# Patient Record
Sex: Male | Born: 1940
Health system: Southern US, Community
[De-identification: ages and names within clinical notes are randomized; demographics above are authoritative.]

## PROBLEM LIST (undated history)

## (undated) DIAGNOSIS — E7800 Pure hypercholesterolemia, unspecified: Secondary | ICD-10-CM

## (undated) DIAGNOSIS — I10 Essential (primary) hypertension: Secondary | ICD-10-CM

## (undated) DIAGNOSIS — R42 Dizziness and giddiness: Secondary | ICD-10-CM

## (undated) HISTORY — PX: CATARACT EXTRACTION: SUR2

## (undated) HISTORY — PX: APPENDECTOMY: SHX54

---

## 2005-09-02 ENCOUNTER — Ambulatory Visit: Payer: Self-pay | Admitting: Ophthalmology

## 2006-08-18 ENCOUNTER — Ambulatory Visit: Payer: Self-pay | Admitting: Gastroenterology

## 2011-06-20 DIAGNOSIS — D485 Neoplasm of uncertain behavior of skin: Secondary | ICD-10-CM | POA: Diagnosis not present

## 2011-07-24 DIAGNOSIS — E7800 Pure hypercholesterolemia, unspecified: Secondary | ICD-10-CM | POA: Diagnosis not present

## 2011-07-24 DIAGNOSIS — D485 Neoplasm of uncertain behavior of skin: Secondary | ICD-10-CM | POA: Diagnosis not present

## 2011-07-24 DIAGNOSIS — J069 Acute upper respiratory infection, unspecified: Secondary | ICD-10-CM | POA: Diagnosis not present

## 2011-07-24 DIAGNOSIS — R42 Dizziness and giddiness: Secondary | ICD-10-CM | POA: Diagnosis not present

## 2011-09-23 DIAGNOSIS — M79609 Pain in unspecified limb: Secondary | ICD-10-CM | POA: Diagnosis not present

## 2011-09-23 DIAGNOSIS — M722 Plantar fascial fibromatosis: Secondary | ICD-10-CM | POA: Diagnosis not present

## 2011-10-14 DIAGNOSIS — M722 Plantar fascial fibromatosis: Secondary | ICD-10-CM | POA: Diagnosis not present

## 2011-10-14 DIAGNOSIS — M79609 Pain in unspecified limb: Secondary | ICD-10-CM | POA: Diagnosis not present

## 2011-10-30 DIAGNOSIS — D485 Neoplasm of uncertain behavior of skin: Secondary | ICD-10-CM | POA: Diagnosis not present

## 2011-10-30 DIAGNOSIS — Z79899 Other long term (current) drug therapy: Secondary | ICD-10-CM | POA: Diagnosis not present

## 2011-10-30 DIAGNOSIS — E7800 Pure hypercholesterolemia, unspecified: Secondary | ICD-10-CM | POA: Diagnosis not present

## 2011-10-30 DIAGNOSIS — I10 Essential (primary) hypertension: Secondary | ICD-10-CM | POA: Diagnosis not present

## 2011-11-20 DIAGNOSIS — H33009 Unspecified retinal detachment with retinal break, unspecified eye: Secondary | ICD-10-CM | POA: Diagnosis not present

## 2011-12-02 DIAGNOSIS — M722 Plantar fascial fibromatosis: Secondary | ICD-10-CM | POA: Diagnosis not present

## 2012-01-21 DIAGNOSIS — M722 Plantar fascial fibromatosis: Secondary | ICD-10-CM | POA: Diagnosis not present

## 2012-04-28 DIAGNOSIS — I10 Essential (primary) hypertension: Secondary | ICD-10-CM | POA: Diagnosis not present

## 2012-04-28 DIAGNOSIS — D485 Neoplasm of uncertain behavior of skin: Secondary | ICD-10-CM | POA: Diagnosis not present

## 2012-04-28 DIAGNOSIS — Z23 Encounter for immunization: Secondary | ICD-10-CM | POA: Diagnosis not present

## 2012-04-28 DIAGNOSIS — Z125 Encounter for screening for malignant neoplasm of prostate: Secondary | ICD-10-CM | POA: Diagnosis not present

## 2012-04-28 DIAGNOSIS — Z1339 Encounter for screening examination for other mental health and behavioral disorders: Secondary | ICD-10-CM | POA: Diagnosis not present

## 2012-04-28 DIAGNOSIS — Z79899 Other long term (current) drug therapy: Secondary | ICD-10-CM | POA: Diagnosis not present

## 2012-04-28 DIAGNOSIS — Z1212 Encounter for screening for malignant neoplasm of rectum: Secondary | ICD-10-CM | POA: Diagnosis not present

## 2012-04-28 DIAGNOSIS — Z0000 Encounter for general adult medical examination without abnormal findings: Secondary | ICD-10-CM | POA: Diagnosis not present

## 2012-04-28 DIAGNOSIS — Z1331 Encounter for screening for depression: Secondary | ICD-10-CM | POA: Diagnosis not present

## 2012-10-05 DIAGNOSIS — D485 Neoplasm of uncertain behavior of skin: Secondary | ICD-10-CM | POA: Diagnosis not present

## 2012-10-05 DIAGNOSIS — L821 Other seborrheic keratosis: Secondary | ICD-10-CM | POA: Diagnosis not present

## 2012-10-28 DIAGNOSIS — J309 Allergic rhinitis, unspecified: Secondary | ICD-10-CM | POA: Diagnosis not present

## 2012-10-28 DIAGNOSIS — Z79899 Other long term (current) drug therapy: Secondary | ICD-10-CM | POA: Diagnosis not present

## 2012-10-28 DIAGNOSIS — I10 Essential (primary) hypertension: Secondary | ICD-10-CM | POA: Diagnosis not present

## 2012-10-28 DIAGNOSIS — E785 Hyperlipidemia, unspecified: Secondary | ICD-10-CM | POA: Diagnosis not present

## 2012-10-28 DIAGNOSIS — E7800 Pure hypercholesterolemia, unspecified: Secondary | ICD-10-CM | POA: Diagnosis not present

## 2012-11-18 DIAGNOSIS — D485 Neoplasm of uncertain behavior of skin: Secondary | ICD-10-CM | POA: Diagnosis not present

## 2013-04-22 ENCOUNTER — Ambulatory Visit: Payer: Self-pay | Admitting: Emergency Medicine

## 2013-04-22 DIAGNOSIS — J069 Acute upper respiratory infection, unspecified: Secondary | ICD-10-CM | POA: Diagnosis not present

## 2013-05-13 DIAGNOSIS — Z1339 Encounter for screening examination for other mental health and behavioral disorders: Secondary | ICD-10-CM | POA: Diagnosis not present

## 2013-05-13 DIAGNOSIS — Z125 Encounter for screening for malignant neoplasm of prostate: Secondary | ICD-10-CM | POA: Diagnosis not present

## 2013-05-13 DIAGNOSIS — Z1331 Encounter for screening for depression: Secondary | ICD-10-CM | POA: Diagnosis not present

## 2013-05-13 DIAGNOSIS — I10 Essential (primary) hypertension: Secondary | ICD-10-CM | POA: Diagnosis not present

## 2013-05-13 DIAGNOSIS — Z79899 Other long term (current) drug therapy: Secondary | ICD-10-CM | POA: Diagnosis not present

## 2013-05-13 DIAGNOSIS — E7800 Pure hypercholesterolemia, unspecified: Secondary | ICD-10-CM | POA: Diagnosis not present

## 2013-05-13 DIAGNOSIS — Z0000 Encounter for general adult medical examination without abnormal findings: Secondary | ICD-10-CM | POA: Diagnosis not present

## 2013-05-13 DIAGNOSIS — Z1212 Encounter for screening for malignant neoplasm of rectum: Secondary | ICD-10-CM | POA: Diagnosis not present

## 2013-06-09 DIAGNOSIS — Z1283 Encounter for screening for malignant neoplasm of skin: Secondary | ICD-10-CM | POA: Diagnosis not present

## 2013-06-09 DIAGNOSIS — D2360 Other benign neoplasm of skin of unspecified upper limb, including shoulder: Secondary | ICD-10-CM | POA: Diagnosis not present

## 2013-06-09 DIAGNOSIS — L570 Actinic keratosis: Secondary | ICD-10-CM | POA: Diagnosis not present

## 2013-06-09 DIAGNOSIS — C4441 Basal cell carcinoma of skin of scalp and neck: Secondary | ICD-10-CM | POA: Diagnosis not present

## 2013-06-09 DIAGNOSIS — D485 Neoplasm of uncertain behavior of skin: Secondary | ICD-10-CM | POA: Diagnosis not present

## 2013-06-09 DIAGNOSIS — Z872 Personal history of diseases of the skin and subcutaneous tissue: Secondary | ICD-10-CM | POA: Diagnosis not present

## 2013-06-30 DIAGNOSIS — Z23 Encounter for immunization: Secondary | ICD-10-CM | POA: Diagnosis not present

## 2013-06-30 DIAGNOSIS — I10 Essential (primary) hypertension: Secondary | ICD-10-CM | POA: Diagnosis not present

## 2013-06-30 DIAGNOSIS — Z79899 Other long term (current) drug therapy: Secondary | ICD-10-CM | POA: Diagnosis not present

## 2013-06-30 DIAGNOSIS — Z1212 Encounter for screening for malignant neoplasm of rectum: Secondary | ICD-10-CM | POA: Diagnosis not present

## 2013-07-28 DIAGNOSIS — C4441 Basal cell carcinoma of skin of scalp and neck: Secondary | ICD-10-CM | POA: Diagnosis not present

## 2013-08-12 DIAGNOSIS — Z961 Presence of intraocular lens: Secondary | ICD-10-CM | POA: Diagnosis not present

## 2013-08-12 DIAGNOSIS — H2510 Age-related nuclear cataract, unspecified eye: Secondary | ICD-10-CM | POA: Diagnosis not present

## 2013-08-19 DIAGNOSIS — I10 Essential (primary) hypertension: Secondary | ICD-10-CM | POA: Diagnosis not present

## 2013-08-19 DIAGNOSIS — J069 Acute upper respiratory infection, unspecified: Secondary | ICD-10-CM | POA: Diagnosis not present

## 2013-08-19 DIAGNOSIS — R059 Cough, unspecified: Secondary | ICD-10-CM | POA: Diagnosis not present

## 2013-08-19 DIAGNOSIS — Z1212 Encounter for screening for malignant neoplasm of rectum: Secondary | ICD-10-CM | POA: Diagnosis not present

## 2013-08-19 DIAGNOSIS — Z79899 Other long term (current) drug therapy: Secondary | ICD-10-CM | POA: Diagnosis not present

## 2013-08-19 DIAGNOSIS — R05 Cough: Secondary | ICD-10-CM | POA: Diagnosis not present

## 2014-01-25 DIAGNOSIS — Z872 Personal history of diseases of the skin and subcutaneous tissue: Secondary | ICD-10-CM | POA: Diagnosis not present

## 2014-01-25 DIAGNOSIS — Z85828 Personal history of other malignant neoplasm of skin: Secondary | ICD-10-CM | POA: Diagnosis not present

## 2014-01-25 DIAGNOSIS — Z1283 Encounter for screening for malignant neoplasm of skin: Secondary | ICD-10-CM | POA: Diagnosis not present

## 2014-06-09 DIAGNOSIS — Z23 Encounter for immunization: Secondary | ICD-10-CM | POA: Diagnosis not present

## 2014-06-09 DIAGNOSIS — Z125 Encounter for screening for malignant neoplasm of prostate: Secondary | ICD-10-CM | POA: Diagnosis not present

## 2014-06-09 DIAGNOSIS — Z0000 Encounter for general adult medical examination without abnormal findings: Secondary | ICD-10-CM | POA: Diagnosis not present

## 2014-06-09 DIAGNOSIS — Z1389 Encounter for screening for other disorder: Secondary | ICD-10-CM | POA: Diagnosis not present

## 2014-06-09 LAB — PSA: PSA: 1.2

## 2014-07-14 DIAGNOSIS — J40 Bronchitis, not specified as acute or chronic: Secondary | ICD-10-CM | POA: Diagnosis not present

## 2014-07-14 DIAGNOSIS — Z1389 Encounter for screening for other disorder: Secondary | ICD-10-CM | POA: Diagnosis not present

## 2014-07-14 DIAGNOSIS — Z23 Encounter for immunization: Secondary | ICD-10-CM | POA: Diagnosis not present

## 2014-07-14 DIAGNOSIS — R05 Cough: Secondary | ICD-10-CM | POA: Diagnosis not present

## 2014-08-31 DIAGNOSIS — E780 Pure hypercholesterolemia: Secondary | ICD-10-CM | POA: Diagnosis not present

## 2014-08-31 DIAGNOSIS — Z23 Encounter for immunization: Secondary | ICD-10-CM | POA: Diagnosis not present

## 2014-08-31 DIAGNOSIS — I10 Essential (primary) hypertension: Secondary | ICD-10-CM | POA: Diagnosis not present

## 2014-08-31 DIAGNOSIS — E785 Hyperlipidemia, unspecified: Secondary | ICD-10-CM | POA: Diagnosis not present

## 2014-08-31 DIAGNOSIS — J309 Allergic rhinitis, unspecified: Secondary | ICD-10-CM | POA: Diagnosis not present

## 2014-08-31 LAB — LIPID PANEL
Cholesterol: 183 mg/dL (ref 0–200)
HDL: 69 mg/dL (ref 35–70)
LDL CALC: 100 mg/dL
TRIGLYCERIDES: 72 mg/dL (ref 40–160)

## 2014-08-31 LAB — BASIC METABOLIC PANEL
BUN: 16 mg/dL (ref 4–21)
Creatinine: 1.1 mg/dL (ref 0.6–1.3)
GLUCOSE: 94 mg/dL
Potassium: 4.5 mmol/L (ref 3.4–5.3)
SODIUM: 144 mmol/L (ref 137–147)

## 2014-08-31 LAB — HEPATIC FUNCTION PANEL
ALT: 17 U/L (ref 10–40)
AST: 24 U/L (ref 14–40)
Alkaline Phosphatase: 69 U/L (ref 25–125)
Bilirubin, Total: 0.7 mg/dL

## 2014-08-31 LAB — CBC AND DIFFERENTIAL
HEMATOCRIT: 41 % (ref 41–53)
Hemoglobin: 14.4 g/dL (ref 13.5–17.5)
NEUTROS ABS: 68 /uL
Platelets: 227 10*3/uL (ref 150–399)
WBC: 4.8 10^3/mL

## 2014-08-31 LAB — TSH: TSH: 4.51 u[IU]/mL (ref 0.41–5.90)

## 2015-01-23 ENCOUNTER — Other Ambulatory Visit: Payer: Self-pay | Admitting: Family Medicine

## 2015-01-24 DIAGNOSIS — Z1283 Encounter for screening for malignant neoplasm of skin: Secondary | ICD-10-CM | POA: Diagnosis not present

## 2015-01-24 DIAGNOSIS — Z09 Encounter for follow-up examination after completed treatment for conditions other than malignant neoplasm: Secondary | ICD-10-CM | POA: Diagnosis not present

## 2015-01-24 DIAGNOSIS — Z872 Personal history of diseases of the skin and subcutaneous tissue: Secondary | ICD-10-CM | POA: Diagnosis not present

## 2015-01-24 DIAGNOSIS — Z08 Encounter for follow-up examination after completed treatment for malignant neoplasm: Secondary | ICD-10-CM | POA: Diagnosis not present

## 2015-01-24 DIAGNOSIS — Z85828 Personal history of other malignant neoplasm of skin: Secondary | ICD-10-CM | POA: Diagnosis not present

## 2015-01-24 DIAGNOSIS — L570 Actinic keratosis: Secondary | ICD-10-CM | POA: Diagnosis not present

## 2015-02-28 DIAGNOSIS — I10 Essential (primary) hypertension: Secondary | ICD-10-CM | POA: Insufficient documentation

## 2015-02-28 DIAGNOSIS — E782 Mixed hyperlipidemia: Secondary | ICD-10-CM | POA: Insufficient documentation

## 2015-02-28 DIAGNOSIS — E785 Hyperlipidemia, unspecified: Secondary | ICD-10-CM | POA: Insufficient documentation

## 2015-02-28 DIAGNOSIS — G250 Essential tremor: Secondary | ICD-10-CM | POA: Insufficient documentation

## 2015-02-28 DIAGNOSIS — E7800 Pure hypercholesterolemia, unspecified: Secondary | ICD-10-CM | POA: Insufficient documentation

## 2015-02-28 DIAGNOSIS — R42 Dizziness and giddiness: Secondary | ICD-10-CM | POA: Insufficient documentation

## 2015-03-01 ENCOUNTER — Encounter: Payer: Self-pay | Admitting: Family Medicine

## 2015-03-01 ENCOUNTER — Ambulatory Visit (INDEPENDENT_AMBULATORY_CARE_PROVIDER_SITE_OTHER): Payer: Medicare Other | Admitting: Family Medicine

## 2015-03-01 VITALS — BP 118/68 | HR 60 | Temp 97.9°F | Resp 16 | Ht 72.0 in | Wt 205.0 lb

## 2015-03-01 DIAGNOSIS — E038 Other specified hypothyroidism: Secondary | ICD-10-CM | POA: Diagnosis not present

## 2015-03-01 DIAGNOSIS — E7800 Pure hypercholesterolemia, unspecified: Secondary | ICD-10-CM

## 2015-03-01 DIAGNOSIS — Z23 Encounter for immunization: Secondary | ICD-10-CM | POA: Diagnosis not present

## 2015-03-01 DIAGNOSIS — I10 Essential (primary) hypertension: Secondary | ICD-10-CM

## 2015-03-01 DIAGNOSIS — IMO0001 Reserved for inherently not codable concepts without codable children: Secondary | ICD-10-CM

## 2015-03-01 DIAGNOSIS — E039 Hypothyroidism, unspecified: Secondary | ICD-10-CM

## 2015-03-01 NOTE — Progress Notes (Signed)
Patient ID: Zachary [name], male   DOB: 07-02-1940, 74 y.o.   MRN: [id number]       Patient: Zachary [name] Male    DOB: 03/13/1941   74 y.o.   MRN: [id number] Visit Date: 03/01/2015  Today's Provider: [name], MD   Chief Complaint  Patient presents with  . Hypertension  . Hyperlipidemia   Subjective:    Hypertension This is a chronic problem. The problem is unchanged. The problem is controlled. There are no compliance problems.   Hyperlipidemia This is a chronic problem. The problem is controlled. There are no compliance problems.        No Known Allergies Previous Medications   ASPIRIN 81 MG TABLET    Take by mouth.   ATORVASTATIN (LIPITOR) 20 MG TABLET    TAKE ONE TABLET BY MOUTH AT BEDTIME   LISINOPRIL (PRINIVIL,ZESTRIL) 10 MG TABLET    Take by mouth.    Review of Systems  Constitutional: Negative.   Respiratory: Negative.   Cardiovascular: Negative.   Endocrine: Negative.   Musculoskeletal: Negative.   Allergic/Immunologic: Negative.   Neurological: Negative.   Psychiatric/Behavioral: Negative.     Social History  Substance Use Topics  . Smoking status: Never Smoker   . Smokeless tobacco: Never Used  . Alcohol Use: No   Objective:   BP 118/68 mmHg  Pulse 60  Temp(Src) 97.9 F (36.6 C)  Resp 16  Ht 6' (1.829 m)  Wt 205 lb (92.987 kg)  BMI 27.80 kg/m2  Physical Exam  Constitutional: He is oriented to person, place, and time. He appears well-developed and well-nourished.  HENT:  Head: Normocephalic and atraumatic.  Right Ear: External ear normal.  Left Ear: External ear normal.  Nose: Nose normal.  Eyes: Conjunctivae are normal.  Neck: Neck supple.  Cardiovascular: Normal rate, regular rhythm and normal heart sounds.   Pulmonary/Chest: Effort normal and breath sounds normal.  Abdominal: Soft.  Neurological: He is alert and oriented to person, place, and time.  Skin: Skin is warm and dry.  Psychiatric: He has a normal mood  and affect. His behavior is normal. Judgment and thought content normal.        Assessment & Plan:     1. Essential hypertension   2. Elevated cholesterol  - Lipid panel  3. Euthyroidism   4. Other specified hypothyroidism  - TSH  5. Need for influenza vaccination  - Flu vaccine HIGH DOSE PF       Richard Cranford Mon, MD  [facility]

## 2015-03-02 LAB — TSH: TSH: 4.68 u[IU]/mL — ABNORMAL HIGH (ref 0.450–4.500)

## 2015-03-02 LAB — LIPID PANEL
CHOLESTEROL TOTAL: 181 mg/dL (ref 100–199)
Chol/HDL Ratio: 2.5 ratio units (ref 0.0–5.0)
HDL: 73 mg/dL (ref >39)
LDL CALC: 97 mg/dL (ref 0–99)
Triglycerides: 55 mg/dL (ref 0–149)
VLDL Cholesterol Cal: 11 mg/dL (ref 5–40)

## 2015-05-04 HISTORY — PX: PLEURAL SCARIFICATION: SHX748

## 2015-05-15 ENCOUNTER — Ambulatory Visit (INDEPENDENT_AMBULATORY_CARE_PROVIDER_SITE_OTHER): Payer: Medicare Other | Admitting: Family Medicine

## 2015-05-15 ENCOUNTER — Encounter: Payer: Self-pay | Admitting: Family Medicine

## 2015-05-15 ENCOUNTER — Inpatient Hospital Stay
Admission: EM | Admit: 2015-05-15 | Discharge: 2015-05-19 | DRG: 201 | Disposition: A | Discharge status: Home / Self Care | Payer: Medicare Other | Attending: General Surgery | Admitting: General Surgery

## 2015-05-15 ENCOUNTER — Ambulatory Visit
Admission: RE | Admit: 2015-05-15 | Discharge: 2015-05-15 | Disposition: A | Discharge status: Home / Self Care | Payer: Medicare Other | Source: Ambulatory Visit | Attending: Family Medicine | Admitting: Family Medicine

## 2015-05-15 ENCOUNTER — Encounter: Payer: Self-pay | Admitting: Emergency Medicine

## 2015-05-15 ENCOUNTER — Emergency Department: Payer: Medicare Other

## 2015-05-15 VITALS — BP 122/64 | HR 66 | Temp 98.0°F | Resp 16 | Wt 200.0 lb

## 2015-05-15 DIAGNOSIS — J939 Pneumothorax, unspecified: Secondary | ICD-10-CM | POA: Diagnosis present

## 2015-05-15 DIAGNOSIS — J930 Spontaneous tension pneumothorax: Secondary | ICD-10-CM | POA: Diagnosis present

## 2015-05-15 DIAGNOSIS — Z9689 Presence of other specified functional implants: Secondary | ICD-10-CM

## 2015-05-15 DIAGNOSIS — R079 Chest pain, unspecified: Secondary | ICD-10-CM | POA: Diagnosis not present

## 2015-05-15 DIAGNOSIS — R7301 Impaired fasting glucose: Secondary | ICD-10-CM | POA: Diagnosis present

## 2015-05-15 DIAGNOSIS — E7800 Pure hypercholesterolemia, unspecified: Secondary | ICD-10-CM | POA: Diagnosis present

## 2015-05-15 DIAGNOSIS — J9311 Primary spontaneous pneumothorax: Secondary | ICD-10-CM | POA: Diagnosis not present

## 2015-05-15 DIAGNOSIS — R059 Cough, unspecified: Secondary | ICD-10-CM

## 2015-05-15 DIAGNOSIS — I10 Essential (primary) hypertension: Secondary | ICD-10-CM | POA: Diagnosis present

## 2015-05-15 DIAGNOSIS — Z9889 Other specified postprocedural states: Secondary | ICD-10-CM | POA: Diagnosis not present

## 2015-05-15 DIAGNOSIS — J9811 Atelectasis: Secondary | ICD-10-CM | POA: Diagnosis not present

## 2015-05-15 DIAGNOSIS — E785 Hyperlipidemia, unspecified: Secondary | ICD-10-CM | POA: Diagnosis present

## 2015-05-15 DIAGNOSIS — J40 Bronchitis, not specified as acute or chronic: Secondary | ICD-10-CM

## 2015-05-15 DIAGNOSIS — R05 Cough: Secondary | ICD-10-CM

## 2015-05-15 DIAGNOSIS — Z4682 Encounter for fitting and adjustment of non-vascular catheter: Secondary | ICD-10-CM | POA: Diagnosis not present

## 2015-05-15 HISTORY — DX: Essential (primary) hypertension: I10

## 2015-05-15 HISTORY — DX: Pure hypercholesterolemia, unspecified: E78.00

## 2015-05-15 LAB — COMPREHENSIVE METABOLIC PANEL
ALT: 15 U/L — AB (ref 17–63)
AST: 20 U/L (ref 15–41)
Albumin: 3.8 g/dL (ref 3.5–5.0)
Alkaline Phosphatase: 67 U/L (ref 38–126)
Anion gap: 6 (ref 5–15)
BUN: 15 mg/dL (ref 6–20)
CALCIUM: 8.8 mg/dL — AB (ref 8.9–10.3)
CHLORIDE: 112 mmol/L — AB (ref 101–111)
CO2: 21 mmol/L — AB (ref 22–32)
Creatinine, Ser: 0.95 mg/dL (ref 0.61–1.24)
GLUCOSE: 132 mg/dL — AB (ref 65–99)
POTASSIUM: 3.8 mmol/L (ref 3.5–5.1)
SODIUM: 139 mmol/L (ref 135–145)
TOTAL PROTEIN: 6.8 g/dL (ref 6.5–8.1)
Total Bilirubin: 0.7 mg/dL (ref 0.3–1.2)

## 2015-05-15 LAB — CBC WITH DIFFERENTIAL/PLATELET
BASOS ABS: 0.1 10*3/uL (ref 0–0.1)
Basophils Relative: 1 %
EOS ABS: 0.3 10*3/uL (ref 0–0.7)
EOS PCT: 4 %
HCT: 41.6 % (ref 40.0–52.0)
Hemoglobin: 14.1 g/dL (ref 13.0–18.0)
LYMPHS PCT: 12 %
Lymphs Abs: 0.8 10*3/uL — ABNORMAL LOW (ref 1.0–3.6)
MCH: 32.4 pg (ref 26.0–34.0)
MCHC: 33.9 g/dL (ref 32.0–36.0)
MCV: 95.3 fL (ref 80.0–100.0)
MONO ABS: 0.6 10*3/uL (ref 0.2–1.0)
Monocytes Relative: 10 %
Neutro Abs: 4.8 10*3/uL (ref 1.4–6.5)
Neutrophils Relative %: 73 %
PLATELETS: 237 10*3/uL (ref 150–440)
RBC: 4.36 MIL/uL — AB (ref 4.40–5.90)
RDW: 13.1 % (ref 11.5–14.5)
WBC: 6.5 10*3/uL (ref 3.8–10.6)

## 2015-05-15 LAB — TROPONIN I: Troponin I: <0.03 ng/mL (ref <0.031)

## 2015-05-15 MED ORDER — MORPHINE SULFATE (PF) 2 MG/ML IV SOLN: 2.0000 mg | INTRAVENOUS | Status: DC | PRN

## 2015-05-15 MED ORDER — ASPIRIN EC 81 MG PO TBEC
81.0000 mg | DELAYED_RELEASE_TABLET | Freq: Every day | ORAL | Status: DC
  Administered 2015-05-15 – 2015-05-19 (×5): 81 mg via ORAL
  Filled 2015-05-15 (×5): qty 1

## 2015-05-15 MED ORDER — LISINOPRIL 5 MG PO TABS
5.0000 mg | ORAL_TABLET | Freq: Every day | ORAL | Status: DC
  Administered 2015-05-15 – 2015-05-16 (×2): 5 mg via ORAL
  Filled 2015-05-15 (×2): qty 1

## 2015-05-15 MED ORDER — ACETAMINOPHEN 650 MG RE SUPP: 650.0000 mg | Freq: Four times a day (QID) | RECTAL | Status: DC | PRN

## 2015-05-15 MED ORDER — SODIUM CHLORIDE 0.9 % IV BOLUS (SEPSIS)
500.0000 mL | Freq: Once | INTRAVENOUS | Status: AC
Start: 2015-05-15 — End: 2015-05-15
  Administered 2015-05-15: 500 mL via INTRAVENOUS

## 2015-05-15 MED ORDER — OXYCODONE HCL 5 MG PO TABS: 5.0000 mg | ORAL_TABLET | ORAL | Status: DC | PRN

## 2015-05-15 MED ORDER — LIDOCAINE HCL (PF) 1 % IJ SOLN
INTRAMUSCULAR | Status: AC
  Filled 2015-05-15: qty 10

## 2015-05-15 MED ORDER — ATROPINE SULFATE 0.1 MG/ML IJ SOLN
INTRAMUSCULAR | Status: AC
  Filled 2015-05-15: qty 10

## 2015-05-15 MED ORDER — ATORVASTATIN CALCIUM 20 MG PO TABS
20.0000 mg | ORAL_TABLET | Freq: Every day | ORAL | Status: DC
  Administered 2015-05-15 – 2015-05-18 (×4): 20 mg via ORAL
  Filled 2015-05-15 (×4): qty 1

## 2015-05-15 MED ORDER — ATROPINE SULFATE 1 MG/ML IJ SOLN
0.5000 mg | Freq: Once | INTRAMUSCULAR | Status: AC
  Administered 2015-05-15: 0.5 mg via INTRAVENOUS

## 2015-05-15 MED ORDER — BUPIVACAINE HCL (PF) 0.5 % IJ SOLN
INTRAMUSCULAR | Status: AC
  Filled 2015-05-15: qty 30

## 2015-05-15 MED ORDER — DOXYCYCLINE HYCLATE 100 MG PO TABS
100.0000 mg | ORAL_TABLET | Freq: Two times a day (BID) | ORAL | Status: DC
  Administered 2015-05-15 – 2015-05-19 (×8): 100 mg via ORAL
  Filled 2015-05-15 (×9): qty 1

## 2015-05-15 MED ORDER — ZOLPIDEM TARTRATE 5 MG PO TABS
5.0000 mg | ORAL_TABLET | Freq: Every evening | ORAL | Status: DC | PRN
  Administered 2015-05-17: 5 mg via ORAL
  Filled 2015-05-15: qty 1

## 2015-05-15 MED ORDER — DOXYCYCLINE HYCLATE 100 MG PO TABS: 100.0000 mg | ORAL_TABLET | Freq: Two times a day (BID) | ORAL | Status: DC

## 2015-05-15 MED ORDER — ACETAMINOPHEN 325 MG PO TABS
650.0000 mg | ORAL_TABLET | Freq: Four times a day (QID) | ORAL | Status: DC | PRN
  Administered 2015-05-17: 650 mg via ORAL
  Filled 2015-05-15: qty 2

## 2015-05-15 MED ORDER — ASPIRIN 81 MG PO TABS
81.0000 mg | ORAL_TABLET | Freq: Every day | ORAL | Status: DC
  Filled 2015-05-15: qty 1

## 2015-05-15 NOTE — ED Provider Notes (Addendum)
Patient sent from Dr. Alben Spittle office for Dr. Jamal Collin to see. Patient has a pneumothorax. The pneumothorax is not a tension per radiology and per my brief review of the film the patient is in no acute distress whatsoever is comfortable nurses putting an IV in him I will standby in case anything is needed but I do not see any need to fill the patient for my brief observation of him  Nena Polio, MD 05/15/15 1326  Dr. Orpah Melter places a chest tube patient then after he leaves becomes bradycardic and hypotensive. Patient slightly back down and put in Trendelenburg restart IV fluids on him and give him half milligram of atropine heart rate comes up into the 50s and blood pressure comes up from [id number] systolic patient feels somewhat better. Chest x-ray is pending.  Nena Polio, MD 05/15/15 [phone]

## 2015-05-15 NOTE — H&P (Signed)
Zachary [name] is an 74 y.o. male.   Chief Complaint: Intermittent pain right chest in the front with cough HPI: This 74 year old male presents with a 2 to three-day history of some nonproductive cough and some intermittent pain in the right anterior chest area. Patient states he works outside a lot and has a tendency to cough because of the dust associated  Irritation. He noted that there was some increase in his symptom along with the pain in the last couple of days. He has not had any difficulty breathing, no fever or chills. He has had the history of some bronchitis off and on the past and has had the when he was told was pneumonia although did not require hospital admission. Patient is in generally good health otherwise. She sought the evaluation by Dr. Rosanna Randy his primary care physician this morning chest x-ray was done showing that he had a large pneumothorax on the right.  Past Medical History  Diagnosis Date  . Hypertension   . Hypercholesterolemia     Past Surgical History  Procedure Laterality Date  . Appendectomy    . Cataract extraction Bilateral     Family History  Problem Relation Age of Onset  . Hypertension Mother   . Hyperlipidemia Mother   . Heart attack Father   . Hypertension Father   . CVA Father   . ALS Brother   . Prostate cancer Brother    Social History:  reports that he has never smoked. He has never used smokeless tobacco. He reports that he does not drink alcohol or use illicit drugs.  Allergies: No Known Allergies   (Not in a hospital admission)  Results for orders placed or performed during the hospital encounter of 05/15/15 (from the past 48 hour(s))  CBC with Differential     Status: Abnormal   Collection Time: 05/15/15  1:27 PM  Result Value Ref Range   WBC 6.5 3.8 - 10.6 K/uL   RBC 4.36 (L) 4.40 - 5.90 MIL/uL   Hemoglobin 14.1 13.0 - 18.0 g/dL   HCT 41.6 40.0 - 52.0 %   MCV 95.3 80.0 - 100.0 fL   MCH 32.4 26.0 - 34.0 pg   MCHC 33.9  32.0 - 36.0 g/dL   RDW 13.1 11.5 - 14.5 %   Platelets 237 150 - 440 K/uL   Neutrophils Relative % 73 %   Neutro Abs 4.8 1.4 - 6.5 K/uL   Lymphocytes Relative 12 %   Lymphs Abs 0.8 (L) 1.0 - 3.6 K/uL   Monocytes Relative 10 %   Monocytes Absolute 0.6 0.2 - 1.0 K/uL   Eosinophils Relative 4 %   Eosinophils Absolute 0.3 0 - 0.7 K/uL   Basophils Relative 1 %   Basophils Absolute 0.1 0 - 0.1 K/uL  Comprehensive metabolic panel     Status: Abnormal   Collection Time: 05/15/15  1:27 PM  Result Value Ref Range   Sodium 139 135 - 145 mmol/L   Potassium 3.8 3.5 - 5.1 mmol/L   Chloride 112 (H) 101 - 111 mmol/L   CO2 21 (L) 22 - 32 mmol/L   Glucose, Bld 132 (H) 65 - 99 mg/dL   BUN 15 6 - 20 mg/dL   Creatinine, Ser 0.95 0.61 - 1.24 mg/dL   Calcium 8.8 (L) 8.9 - 10.3 mg/dL   Total Protein 6.8 6.5 - 8.1 g/dL   Albumin 3.8 3.5 - 5.0 g/dL   AST 20 15 - 41 U/L   ALT 15 (L)  17 - 63 U/L   Alkaline Phosphatase 67 38 - 126 U/L   Total Bilirubin 0.7 0.3 - 1.2 mg/dL   GFR calc non Af Amer >60 >60 mL/min   GFR calc Af Amer >60 >60 mL/min    Comment: (NOTE) The eGFR has been calculated using the CKD EPI equation. This calculation has not been validated in all clinical situations. eGFR's persistently <60 mL/min signify possible Chronic Kidney Disease.    Anion gap 6 5 - 15  Troponin I     Status: None   Collection Time: 05/15/15  1:27 PM  Result Value Ref Range   Troponin I <0.03 <0.031 ng/mL    Comment:        NO INDICATION OF MYOCARDIAL INJURY.    Dg Chest 1 View  05/15/2015  CLINICAL DATA:  74 year old male status post chest tube placement EXAM: CHEST 1 VIEW COMPARISON:  Chest x-ray obtained earlier today FINDINGS: Interval placement of a right-sided thoracostomy tube with re-expansion of the right lung. No visible residual pneumothorax. There is new bi-basilar atelectasis. Cardiac and mediastinal contours remain unchanged. Trace atherosclerotic calcification is present in the transverse  aorta. No acute osseous abnormality. IMPRESSION: 1. Successful re-expansion of the right lung following chest tube placement. No visible residual pneumothorax. 2. Interval development of bibasilar atelectasis. Electronically Signed   By: Jacqulynn Cadet M.D.   On: 05/15/2015 14:50   Dg Chest 2 View  05/15/2015  CLINICAL DATA:  RIGHT-sided chest pain and congestion since Friday, chest pain worse when lying down, recently diagnosed with bronchitis EXAM: CHEST  2 VIEW COMPARISON:  None. FINDINGS: Upper normal heart size. Moderate sized hiatal hernia. Otherwise normal mediastinal contours and pulmonary vascularity. Large RIGHT pneumothorax without mediastinal shift to suggest tension. Atelectasis in RIGHT lower lobe. Question RIGHT nipple shadow versus pulmonary nodule. Remaining lungs clear. IMPRESSION: Large RIGHT pneumothorax without mediastinal shift. Subsegmental atelectasis RIGHT lower lobe. Question RIGHT nipple shadow - nipple markers be placed on a follow-up exam to evaluate. Moderate sized hiatal hernia. Critical Value/emergent results were called by telephone at the time of interpretation on 05/15/2015 at 10:29 am to Dr. [name] , who verbally acknowledged these results. Electronically Signed   By: Lavonia Dana M.D.   On: 05/15/2015 10:31    Review of Systems  Constitutional: Negative.   HENT: Negative.   Respiratory: Positive for cough.        Intermittent pain right anterior chest for last 2 days  Cardiovascular: Negative.   Gastrointestinal: Negative.   Genitourinary: Negative.     Blood pressure 105/81, pulse 62, temperature 97.5 F (36.4 C), temperature source Oral, resp. rate 16, height 6' 2"  (1.88 m), weight 204 lb (92.534 kg), SpO2 98 %. Physical Exam  Constitutional: He is oriented to person, place, and time. He appears well-developed and well-nourished.  HENT:  Head: Normocephalic.  Eyes: Conjunctivae are normal. No scleral icterus.  Neck: Neck supple. No tracheal  deviation present.  Cardiovascular: Normal rate, regular rhythm and normal heart sounds.   Respiratory: Effort normal. No respiratory distress. He has decreased breath sounds in the right upper field, the right middle field and the right lower field. He has no wheezes. He has no rhonchi. He has no rales.  GI: Soft. Bowel sounds are normal. There is no tenderness.  Musculoskeletal: He exhibits no edema or tenderness.  Lymphadenopathy:    He has no cervical adenopathy.  Neurological: He is alert and oriented to person, place, and time.  Skin: Skin  is warm and dry.     Assessment/Plan Large pneumothorax in the right chest likely from a small bulla. His chest x-ray does not reveal any significant pulmonary disease or any evidence of large  Bullae. With consent the chest tube was positioned in the right side with the good reexpansion of the right lung. Patient is to be admitted for continued management of his pneumothorax.  Makail Watling G 05/15/2015, 3:02 PM

## 2015-05-15 NOTE — ED Notes (Signed)
Patient now pink, warm and dry, monitor SR, deep breathing with no further air noted through water seal, wife at bedside.

## 2015-05-15 NOTE — ED Notes (Signed)
Cough x 2 days, seen by MD, sent for abnormal chest xray.

## 2015-05-15 NOTE — ED Notes (Signed)
Lidocaine and bupivacaine given to MD for chest tube procedure.

## 2015-05-15 NOTE — ED Notes (Signed)
Following chest tube insertion pts heart rate was 42 and blood pressure 71/60. Pt pale and diaphoretic and pt stated he felt like he was going to faint. Fluids hung, pt given 0.5mg  atropine. Pt placed on 6L [place]. VS stable at this time. BP 124/89, heart rate 56. Pt sts he feels much better.

## 2015-05-15 NOTE — Progress Notes (Signed)
Patient ID: Zachary [name], male   DOB: 1940/10/23, 74 y.o.   MRN: [id number]    Subjective:  HPI Pt is here today because he has URI symptoms. He reports that it started about 3 days ago. He has a cough, nasal congestion, chest congestion and right sided chest pain. His sputum is clear. The chest pain is made worse when he lays on his right side and when he coughs. He thinks that he could have pulled a muscle from coughing or from the leaf blower. He describes the pain as a pulling sensation. The cough started before the chest pain started. Denies shortness of breath, or wheezing.  Prior to Admission medications   Medication Sig Start Date End Date Taking? Authorizing Provider  aspirin 81 MG tablet Take by mouth. 12/18/10  Yes Historical Provider, MD  atorvastatin (LIPITOR) 20 MG tablet TAKE ONE TABLET BY MOUTH AT BEDTIME 01/24/15  Yes Ceciley Buist Maceo Pro., MD  lisinopril (PRINIVIL,ZESTRIL) 10 MG tablet Take by mouth. 07/15/14  Yes Historical Provider, MD    Patient Active Problem List   Diagnosis Date Noted  . Benign essential tremor 02/28/2015  . HLD (hyperlipidemia) 02/28/2015  . BP (high blood pressure) 02/28/2015  . Hypercholesterolemia without hypertriglyceridemia 02/28/2015  . Head revolving around 02/28/2015    History reviewed. No pertinent past medical history.  Social History   Social History  . Marital Status: Married    Spouse Name: N/A  . Number of Children: N/A  . Years of Education: N/A   Occupational History  . Not on file.   Social History Main Topics  . Smoking status: Never Smoker   . Smokeless tobacco: Never Used  . Alcohol Use: No  . Drug Use: No  . Sexual Activity: Not on file   Other Topics Concern  . Not on file   Social History Narrative    No Known Allergies  Review of Systems  Constitutional: Positive for malaise/fatigue.  HENT: Positive for congestion.   Eyes: Negative.   Respiratory: Positive for cough and sputum production.     Cardiovascular: Positive for chest pain.  Gastrointestinal: Negative.   Genitourinary: Negative.   Musculoskeletal: Negative.   Skin: Negative.   Neurological: Negative.   Endo/Heme/Allergies: Negative.   Psychiatric/Behavioral: Negative.     Immunization History  Administered Date(s) Administered  . Influenza, High Dose Seasonal PF 03/01/2015  . Pneumococcal Conjugate-13 06/09/2014  . Pneumococcal Polysaccharide-23 04/07/1996  . Td 02/10/2002  . Tdap 06/09/2014  . Zoster 06/30/2013   Objective:  BP 122/64 mmHg  Pulse 66  Temp(Src) 98 F (36.7 C) (Oral)  Resp 16  Wt 200 lb (90.719 kg)  Physical Exam  Constitutional: He is oriented to person, place, and time and well-developed, well-nourished, and in no distress.  HENT:  Head: Normocephalic and atraumatic.  Right Ear: External ear normal.  Left Ear: External ear normal.  Nose: Nose normal.  Mouth/Throat: Oropharynx is clear and moist.  Eyes: Conjunctivae and EOM are normal. Pupils are equal, round, and reactive to light.  Neck: Normal range of motion. Neck supple.  Cardiovascular: Normal rate, regular rhythm, normal heart sounds and intact distal pulses.   Pulmonary/Chest: Effort normal and breath sounds normal.  Not reproducible with palpation.   Abdominal: Soft. Bowel sounds are normal.  Neurological: He is alert and oriented to person, place, and time. Gait normal. GCS score is 15.  Skin: Skin is warm and dry.  Psychiatric: Mood, memory, affect and judgment normal.    Lab  Results  Component Value Date   WBC 4.8 08/31/2014   HGB 14.4 08/31/2014   HCT 41 08/31/2014   PLT 227 08/31/2014   CHOL 181 03/01/2015   TRIG 55 03/01/2015   HDL 73 03/01/2015   LDLCALC 97 03/01/2015   TSH 4.680* 03/01/2015   PSA 1.2 06/09/2014    CMP     Component Value Date/Time   NA 144 08/31/2014   K 4.5 08/31/2014   BUN 16 08/31/2014   CREATININE 1.1 08/31/2014   AST 24 08/31/2014   ALT 17 08/31/2014   ALKPHOS 69  08/31/2014    Assessment and Plan :  1. Cough  - DG Chest 2 View; Future  2. Chest pain, unspecified chest pain type Chest x-ray-actually reveals a moderate to large pneumothorax on the right, no tension pneumothorax. I discussed this with the patient and he is to see Dr. Jamal Collin in the hospital who will surgically take care of this problem. - EKG 12-Lead - DG Chest 2 View; Future  3. Bronchitis Presumptively treat - doxycycline (VIBRA-TABS) 100 MG tablet; Take 1 tablet (100 mg total) by mouth 2 (two) times daily.  Dispense: 14 tablet; Refill: 0   Patient was seen and examined by Dr. Miguel Aschoff, and noted scribed by Webb Laws, [place] MD [facility] 05/15/2015 9:06 AM

## 2015-05-16 ENCOUNTER — Inpatient Hospital Stay: Payer: Medicare Other

## 2015-05-16 MED ORDER — ENALAPRIL MALEATE 5 MG PO TABS
5.0000 mg | ORAL_TABLET | Freq: Once | ORAL | Status: AC
  Administered 2015-05-16: 5 mg via ORAL
  Filled 2015-05-16: qty 1

## 2015-05-16 NOTE — Progress Notes (Signed)
Notified Dr.Byrnett of pt elevated b/p and dr is aware. No new orders given

## 2015-05-16 NOTE — Progress Notes (Signed)
Pt b/p elevated at 178/104 pr manual. Notified on call Dr.smith and ordered vasotec 5mg  x1 po.

## 2015-05-16 NOTE — Progress Notes (Signed)
Patient ID: Zachary [name], male   DOB: December 04, 1940, 74 y.o.   MRN: [id number] No complaints. Very minimal pain. AVSS. Ox sat 99% Chest tube intact, Very tiny air leak with coughing.  CXR-no pneumo. Doing well. Ambulate with assist.

## 2015-05-16 NOTE — Procedures (Signed)
Procedure: Insertion chest tube right chest  Date: 05/15/15  Anesthetic: 20 mL of 0.5% Marcaine mixed with 1% Xylocaine  Description: Patient was placed in the left lateral position. The mid anterolateral right chest area was prepped with ChloraPrep and draped. 2 1cm incisions were made about 5 cm part after instillation of local anesthetic. A 624 French chest tube was tunneled from the lower incision into the upper incision and subsequently positioned into the chest cavity successfully. The chest tube was then connected to Pleur-evac with the retrieval of a large volume of air. Skin mark was at 14 cm. The upper incision was closed with 2-0 silk. The lower incision was used to tack the tube with 2 stitches of 2-0 silk. Dry sterile dressing was placed.  Procedure was well-tolerated with no immediate problems encountered. Postprocedure chest x-ray revealed no pneumothorax.

## 2015-05-17 MED ORDER — LISINOPRIL 10 MG PO TABS
10.0000 mg | ORAL_TABLET | Freq: Once | ORAL | Status: AC
  Administered 2015-05-17: 10 mg via ORAL
  Filled 2015-05-17: qty 1

## 2015-05-17 MED ORDER — FUROSEMIDE 20 MG PO TABS
20.0000 mg | ORAL_TABLET | Freq: Once | ORAL | Status: AC
  Administered 2015-05-17: 20 mg via ORAL
  Filled 2015-05-17: qty 1

## 2015-05-17 NOTE — Progress Notes (Signed)
Patient ID: Zachary [name], male   DOB: 1941/03/26, 73 y.o.   MRN: [id number] No complaints. Has been up and walked in hallway. Chest tube intact. Still with a little leak on coughing. AVSS. Overall stable. Cont suction on chest tube.  CXR in am

## 2015-05-17 NOTE — Progress Notes (Signed)
Dr.Byrnett notified of pt b/p of 178/118 manual.  Orders to give 10mg  lisinopril now and hold 10am dose.

## 2015-05-17 NOTE — Progress Notes (Signed)
Taken by this RN and Dr.notified orders put in

## 2015-05-17 NOTE — Progress Notes (Signed)
BP consistently elevated since admission. No significant pain reported. Sats, remainder of VS fine. Will dose with lasix x 1 and increase lisinopril to 10 mg now for a one time dose.

## 2015-05-17 NOTE — Care Management Important Message (Signed)
Important Message  Patient Details  Name: Zachary [name] MRN: [id number] Date of Birth: 18-Dec-1940   Medicare Important Message Given:  Yes    Juliann Pulse A Graylen [place]oa 05/17/2015, 11:10 AM

## 2015-05-18 ENCOUNTER — Inpatient Hospital Stay: Payer: Medicare Other

## 2015-05-18 MED ORDER — AMLODIPINE BESYLATE 5 MG PO TABS
5.0000 mg | ORAL_TABLET | Freq: Every day | ORAL | Status: DC
  Administered 2015-05-18 – 2015-05-19 (×2): 5 mg via ORAL
  Filled 2015-05-18 (×2): qty 1

## 2015-05-18 MED ORDER — LISINOPRIL 20 MG PO TABS
20.0000 mg | ORAL_TABLET | Freq: Every day | ORAL | Status: DC
  Administered 2015-05-18 – 2015-05-19 (×2): 20 mg via ORAL
  Filled 2015-05-18 (×2): qty 1

## 2015-05-18 MED ORDER — HYDRALAZINE HCL 20 MG/ML IJ SOLN
10.0000 mg | INTRAMUSCULAR | Status: DC | PRN
  Administered 2015-05-18: 10 mg via INTRAVENOUS
  Filled 2015-05-18: qty 1

## 2015-05-18 NOTE — Progress Notes (Signed)
Patient ID: Zachary [name], male   DOB: 07-01-1940, 74 y.o.   MRN: [id number] Pt with no complaints. No trouble breathing. Pain only when he coughs. Afebrile. BP tending to be high-he says this happens a lot when he is anxious. Chest tube intact- still has air leak with coughing and bit more on water seal alone. Discussed fully with pt. Plan- Med consult for assist with BP control. Continue suction on chest tube. If he fails to seal in the next few days will plan for thoracoscopy/pleurodesis.

## 2015-05-18 NOTE — Progress Notes (Signed)
Called Dr. Tamala Julian regarding patient's elevated blood pressure- 150/101.  I was told to call hospitalist b/c they were treating the hypertension.  Called Dr. Lavetta Nielsen @ 2112 to ask for prn for elevated blood pressure.  Doctor ordered hydralazine 10mg  prn.  Christene Slates  05/18/2015  9:19 PM

## 2015-05-18 NOTE — Consult Note (Signed)
[facility] NAME: Zachary [name]    MR#:  [id number]  DATE OF BIRTH:  13-Mar-1941  DATE OF ADMISSION:  05/15/2015  PRIMARY CARE PHYSICIAN: [name], MD   REQUESTING/REFERRING PHYSICIAN: Dr. Jamal Collin  CHIEF COMPLAINT:   Chief Complaint  Patient presents with  . Cough    HISTORY OF PRESENT ILLNESS:  Zachary [name]  is a 74 y.o. male with a known history of hypertension. Patient had pain in his chest for 1-1/2 weeks and he went to see Dr. Rosanna Randy who sent him for chest x-ray and he was referred into the hospital for pneumothorax. Dr. Jamal Collin surgery admitted the patient after placing a chest tube. Consult was called for her hypertension management.  PAST MEDICAL HISTORY:   Past Medical History  Diagnosis Date  . Hypertension   . Hypercholesterolemia     PAST SURGICAL HISTOIRY:   Past Surgical History  Procedure Laterality Date  . Appendectomy    . Cataract extraction Bilateral     SOCIAL HISTORY:   Social History  Substance Use Topics  . Smoking status: Never Smoker   . Smokeless tobacco: Never Used  . Alcohol Use: No    FAMILY HISTORY:   Family History  Problem Relation Age of Onset  . Hypertension Mother   . Hyperlipidemia Mother   . Heart attack Father   . Hypertension Father   . CVA Father   . ALS Brother   . Prostate cancer Brother     DRUG ALLERGIES:  No Known Allergies  REVIEW OF SYSTEMS:  CONSTITUTIONAL: No fever, positive for weakness.  EYES: No blurred or double vision.  EARS, NOSE, AND THROAT: No tinnitus or ear pain.  RESPIRATORY: Some cough with blood-tinged, no shortness of breath, wheezing.  CARDIOVASCULAR: Positive chest pain at tube site, no orthopnea, edema.  GASTROINTESTINAL: No nausea, vomiting, or abdominal pain. Diarrhea last Friday but resolved GENITOURINARY: No dysuria, hematuria.  ENDOCRINE: No polyuria, nocturia,  HEMATOLOGY: No anemia, easy bruising  or bleeding SKIN: No rash or lesion. MUSCULOSKELETAL: No joint pain or arthritis.   NEUROLOGIC: No tingling, numbness, weakness.  PSYCHIATRY: No anxiety or depression.   MEDICATIONS AT HOME:   Prior to Admission medications   Medication Sig Start Date End Date Taking? Authorizing Provider  aspirin EC 81 MG tablet Take 81 mg by mouth at bedtime.   Yes Historical Provider, MD  atorvastatin (LIPITOR) 20 MG tablet Take 20 mg by mouth at bedtime.   Yes Historical Provider, MD  lisinopril (PRINIVIL,ZESTRIL) 10 MG tablet Take 10 mg by mouth at bedtime.    Yes Historical Provider, MD  doxycycline (VIBRA-TABS) 100 MG tablet Take 1 tablet (100 mg total) by mouth 2 (two) times daily. 05/15/15   Caesar Mannella Maceo Pro., MD      VITAL SIGNS:  Blood pressure 141/87, pulse 92, temperature 99 F (37.2 C), temperature source Oral, resp. rate 17, height 6\' 2"  (1.88 m), weight 96 kg (211 lb 10.3 oz), SpO2 94 %.  PHYSICAL EXAMINATION:  GENERAL:  74 y.o.-year-old patient lying in the bed with no acute distress.  EYES: Pupils equal, round, reactive to light and accommodation. No scleral icterus. Extraocular muscles intact.  HEENT: Head atraumatic, normocephalic. Oropharynx and nasopharynx clear.  NECK:  Supple, no jugular venous distention. No thyroid enlargement, no tenderness.  LUNGS: Decreased breath sounds right base, no wheezing, rales,rhonchi or crepitation. No use of accessory muscles of respiration.  CARDIOVASCULAR: S1, S2 normal. 2/6  systolic murmur, no rubs, or gallops.  ABDOMEN: Soft, nontender, nondistended. Bowel sounds present. No organomegaly or mass.  EXTREMITIES: No pedal edema, cyanosis, or clubbing.  NEUROLOGIC: Cranial nerves II through XII are intact. Muscle strength 5/5 in all extremities. Sensation intact. Gait not checked.  PSYCHIATRIC: The patient is alert and oriented x 3.  SKIN: No obvious rash, lesion, or ulcer.   LABORATORY PANEL:   CBC  Recent Labs Lab 05/15/15 1327   WBC 6.5  HGB 14.1  HCT 41.6  PLT 237   ------------------------------------------------------------------------------------------------------------------  Chemistries   Recent Labs Lab 05/15/15 1327  NA 139  K 3.8  CL 112*  CO2 21*  GLUCOSE 132*  BUN 15  CREATININE 0.95  CALCIUM 8.8*  AST 20  ALT 15*  ALKPHOS 67  BILITOT 0.7   ------------------------------------------------------------------------------------------------------------------  Cardiac Enzymes  Recent Labs Lab 05/15/15 1327  TROPONINI <0.03   ------------------------------------------------------------------------------------------------------------------  RADIOLOGY:  Dg Chest Port 1 View  05/18/2015  CLINICAL DATA:  RIGHT-sided chest tube post surgery EXAM: PORTABLE CHEST 1 VIEW COMPARISON:  Portable exam 0546 hours compared to 05/16/2015 FINDINGS: RIGHT basilar thoracostomy tube unchanged. Enlargement of cardiac silhouette. Mediastinal contours and pulmonary vascularity normal. Mild RIGHT basilar subsegmental atelectasis. Remaining lungs clear. No pleural effusion or pneumothorax. IMPRESSION: RIGHT thoracostomy tube without pneumothorax. Subsegmental atelectasis RIGHT base. Electronically Signed   By: Lavonia Dana M.D.   On: 05/18/2015 07:18    IMPRESSION AND PLAN:   1. Essential hypertension - I added 5 mg of Norvasc and increased his lisinopril to 20 mg daily  2. Hyperlipidemia unspecified - continue atorvastatin  3. Tension Pneumothorax - chest tube management as per Dr. Jamal Collin. No history of trauma to the chest or history of lung disease as per the patient. Patient never smoked. 4. Impaired fasting glucose- check a hemoglobin A1c  All the records are reviewed and case discussed with Consulting provider. Management plans discussed with the patient, family and they are in agreement.  CODE STATUS: Full code   TOTAL TIME TAKING CARE OF THIS PATIENT: 50  minutes.    Loletha Grayer M.D on  05/18/2015 at 3:53 PM  Between 7am to 6pm - Pager - [phone]  After 6pm go to [email or web address] - password [email or web address] [facility]  Office  [phone]  CC: Primary care Physician: [name], MD

## 2015-05-19 ENCOUNTER — Inpatient Hospital Stay: Payer: Medicare Other

## 2015-05-19 LAB — HEMOGLOBIN A1C: Hgb A1c MFr Bld: 5.2 % (ref 4.0–6.0)

## 2015-05-19 MED ORDER — LISINOPRIL 20 MG PO TABS: 20.0000 mg | ORAL_TABLET | Freq: Every day | ORAL | Status: DC

## 2015-05-19 MED ORDER — AMLODIPINE BESYLATE 5 MG PO TABS: 5.0000 mg | ORAL_TABLET | Freq: Every day | ORAL | Status: DC

## 2015-05-19 MED ORDER — OXYCODONE HCL 5 MG PO TABS: 5.0000 mg | ORAL_TABLET | ORAL | Status: DC | PRN

## 2015-05-19 NOTE — Care Management Important Message (Signed)
Important Message  Patient Details  Name: Zachary [name] MRN: [id number] Date of Birth: [birth year]   Medicare Important Message Given:  Yes    Juliann Pulse A Mardie Kellen 05/19/2015, 10:09 AM

## 2015-05-19 NOTE — Progress Notes (Signed)
Initial Nutrition Assessment      INTERVENTION: Meals and snacks: Cater to pt preferences   NUTRITION DIAGNOSIS:    (none at this time) related to   as evidenced by  .    GOAL:   Patient will meet greater than or equal to 90% of their needs    MONITOR:    (Energy intake)  REASON FOR ASSESSMENT:   LOS    ASSESSMENT:      Pt admitted with pneumothorax, chest tube in place  Past Medical History  Diagnosis Date  . Hypertension   . Hypercholesterolemia      Current Nutrition: tolerating solid foods, eating well mostly 100% of meals  Food/Nutrition-Related History: Pt reports good appetite prior to admission   Scheduled Medications:  . amLODipine  5 mg Oral Daily  . aspirin EC  81 mg Oral Daily  . atorvastatin  20 mg Oral QHS  . doxycycline  100 mg Oral BID  . lisinopril  20 mg Oral Daily        Electrolyte/Renal Profile and Glucose Profile:   Recent Labs Lab 05/15/15 1327  NA 139  K 3.8  CL 112*  CO2 21*  BUN 15  CREATININE 0.95  CALCIUM 8.8*  GLUCOSE 132*   Protein Profile:  Recent Labs Lab 05/15/15 1327  ALBUMIN 3.8    Gastrointestinal Profile: Last BM:12/15    Weight Change: stable wt prior to admission   Diet Order:  Diet regular Room service appropriate?: Yes; Fluid consistency:: Thin Diet - low sodium heart healthy  Skin:   reviewed   Height:   Ht Readings from Last 1 Encounters:  05/15/15 6\' 2"  (1.88 m)    Weight:   Wt Readings from Last 1 Encounters:  05/15/15 211 lb 10.3 oz (96 kg)    Ideal Body Weight:     BMI:  Body mass index is 27.16 kg/(m^2).   EDUCATION NEEDS:   No education needs identified at this time  LOW Care Level  Okie Jansson B. Zenia Resides, [place], [place] (pager)

## 2015-05-19 NOTE — Progress Notes (Signed)
Spoke with patient who is from home with spouse and was independent prior to admission. He stated that he was driving self before. Patient will have assistance of his spouse who is independent when discharged. Patient stated that he does not house a walker or cane. House is all on one level. Patient feels he is safe to go home . Patient will have chest tub in place with Hymlic valve and follow up with Dr Jamal Collin for removal in his office on Monday.  No HH needs identified.

## 2015-05-19 NOTE — Discharge Summary (Signed)
Physician Discharge Summary  Patient ID: Zachary [name] MRN: [id number] DOB/AGE: 09-18-1940 74 y.o.  Admit date: 05/15/2015 Discharge date: 05/19/2015  Admission Diagnoses: Spontaneous right pneumothorax  Discharge Diagnoses:  Active Problems:   Pneumothorax on right   Discharged Condition: good  Hospital Course: 74 year old male who presented with a 2 day history of some vague anterior chest pain on the right. He was seen by his primary care physician Dr. Miguel Aschoff on the morning of 05/15/15. Chest x-ray was performed showing a large right pneumothorax. Surgical consultation was requested. Patient had denied any previous pulmonary issues and was not a smoker. The chest x-ray and bedside showing the pneumothorax showed no evidence of any underlying disease involving the lungs. With consent the right closed thoracostomy was performed with a 24 French chest tube. This was then connected to Pleur-evac and suction and patient admitted. Patient was observed for the next 3-4 days and showed that the he had a persistent small air leak. Follow-up chest x-ray showed a fully expanded lung. A CT scan was obtained which showed the small pneumothorax but no other significant of bullous disease in the lung identified as a source for the pneumothorax. Patient had a history of high blood pressure and this appeared to be a somewhat out of control wall he was in the hospital. He was seen in consultation by internal medicine and his BP meds were adjusted. It was decided at this time to place a Heimlich valve and discharge the patient. He'll be followed closely as an outpatient and if the leak fails to resolve we'll consider a instillation of doxycycline introduced chest or   thoracoscopy and pleurodesis. Patient accordingly is being discharged today at 05/19/2015 to be followed as an outpatient.  Consults: internal medicicneof    Significant Diagnostic Studies: radiology: CT scan: chest  Treatments:  surgery: chest tube and pleurevac Discharge Exam: Blood pressure 150/99, pulse 91, temperature 98.6 F (37 C), temperature source Oral, resp. rate 16, height 6\' 2"  (1.88 m), weight 211 lb 10.3 oz (96 kg), SpO2 93 %. Chest wall: no tenderness, Chest tube intact and functioning well.Heimlich valve connected  Disposition: Final discharge disposition not confirmed  Discharge Instructions    Diet - low sodium heart healthy    Complete by:  As directed      Discharge instructions    Complete by:  As directed   No exertional activity. Care of Chest tube as instructed     Increase activity slowly    Complete by:  As directed             Medication List    STOP taking these medications        doxycycline 100 MG tablet  Commonly known as:  VIBRA-TABS      TAKE these medications        amLODipine 5 MG tablet  Commonly known as:  NORVASC  Take 1 tablet (5 mg total) by mouth daily.     aspirin EC 81 MG tablet  Take 81 mg by mouth at bedtime.     atorvastatin 20 MG tablet  Commonly known as:  LIPITOR  Take 20 mg by mouth at bedtime.     lisinopril 20 MG tablet  Commonly known as:  PRINIVIL,ZESTRIL  Take 1 tablet (20 mg total) by mouth daily.     oxyCODONE 5 MG immediate release tablet  Commonly known as:  Oxy IR/ROXICODONE  Take 1-2 tablets (5-10 mg total) by mouth every 4 (four) hours as  needed for moderate pain.           Follow-up Information    Schedule an appointment as soon as possible for a visit in 3 days to follow up.      SignedChristene Lye 05/19/2015, 9:29 AM

## 2015-05-19 NOTE — Progress Notes (Signed)
Pt stable. IV removed. D/c instructions given and education provided. Timeout completed for pt discharge meds. Signed prescriptions given. Gauze and tape provided and education completed on drain and incision care. Pr dressed and will be escorted out by staff.

## 2015-05-19 NOTE — Progress Notes (Signed)
Patient ID: Zachary [name], male   DOB: [birth year], 74 y.o.   MRN: [id number] [facility] PROGRESS NOTE  PCP: [name], MD  HPI/Subjective: Patient did cough up a slight bit of blood today. No shortness of breath. No abdominal pain. Feels okay.  Objective: Filed Vitals:   05/18/15 2230 05/19/15 0522  BP: 147/90 150/99  Pulse:  91  Temp:  98.6 F (37 C)  Resp:  16    Filed Weights   05/15/15 1314 05/15/15 2005  Weight: 92.534 kg (204 lb) 96 kg (211 lb 10.3 oz)    ROS: Review of Systems  Constitutional: Negative for fever and chills.  Eyes: Negative for blurred vision.  Respiratory: Positive for cough and hemoptysis. Negative for shortness of breath.   Cardiovascular: Negative for chest pain.  Gastrointestinal: Negative for nausea, vomiting, abdominal pain, diarrhea and constipation.  Genitourinary: Negative for dysuria.  Musculoskeletal: Negative for joint pain.  Neurological: Negative for dizziness and headaches.   Exam: Physical Exam  Constitutional: He is oriented to person, place, and time.  HENT:  Nose: No mucosal edema.  Mouth/Throat: No oropharyngeal exudate or posterior oropharyngeal edema.  Eyes: Conjunctivae, EOM and lids are normal. Pupils are equal, round, and reactive to light.  Neck: No JVD present. Carotid bruit is not present. No edema present. No thyroid mass and no thyromegaly present.  Cardiovascular: S1 normal and S2 normal.  Exam reveals no gallop.   No murmur heard. Pulses:      Dorsalis pedis pulses are 2+ on the right side, and 2+ on the left side.  Respiratory: No respiratory distress. He has no wheezes. He has no rhonchi. He has no rales.  GI: Soft. Bowel sounds are normal. There is no tenderness.  Musculoskeletal:       Right ankle: He exhibits no swelling.       Left ankle: He exhibits no swelling.  Lymphadenopathy:    He has no cervical adenopathy.  Neurological: He is alert and oriented to person, place, and  time. No cranial nerve deficit.  Skin: Skin is warm. No rash noted. Nails show no clubbing.  Psychiatric: He has a normal mood and affect.    Data Reviewed: Basic Metabolic Panel:  Recent Labs Lab 05/15/15 1327  NA 139  K 3.8  CL 112*  CO2 21*  GLUCOSE 132*  BUN 15  CREATININE 0.95  CALCIUM 8.8*   Liver Function Tests:  Recent Labs Lab 05/15/15 1327  AST 20  ALT 15*  ALKPHOS 67  BILITOT 0.7  PROT 6.8  ALBUMIN 3.8   CBC:  Recent Labs Lab 05/15/15 1327  WBC 6.5  NEUTROABS 4.8  HGB 14.1  HCT 41.6  MCV 95.3  PLT 237   Cardiac Enzymes:  Recent Labs Lab 05/15/15 1327  TROPONINI <0.03     Studies: Ct Chest Wo Contrast  05/18/2015  CLINICAL DATA:  74 year old male with recent history of chest pain, found to have a spontaneous pneumothorax, status post chest tube placement. EXAM: CT CHEST WITHOUT CONTRAST TECHNIQUE: Multidetector CT imaging of the chest was performed following the standard protocol without IV contrast. COMPARISON:  No priors. FINDINGS: Mediastinum/Lymph Nodes: Heart size is normal. There is no significant pericardial fluid, thickening or pericardial calcification. There is atherosclerosis of the thoracic aorta, the great vessels of the mediastinum and the coronary arteries, including calcified atherosclerotic plaque in the left anterior descending, left circumflex and right coronary arteries. Mild aneurysmal dilatation of the ascending thoracic aorta (4.5 cm  in diameter). Calcifications of the aortic valve. No pathologically enlarged mediastinal or hilar lymph nodes. Please note that accurate exclusion of hilar adenopathy is limited on noncontrast CT scans. Moderate-sized hiatal hernia. No axillary lymphadenopathy. Lungs/Pleura: Small right-sided pneumothorax anteriorly. Right-sided chest tube in place with tip in the medial aspect of the lower right hemithorax. Dependent atelectasis is noted in the right lower lobe. There may also be some consolidative  changes in the right lower lobe as well. Partial atelectasis of the right middle lobe. Mild scarring in the periphery of the left lower lobe. No definite suspicious appearing pulmonary nodules or masses. Upper Abdomen: Atherosclerosis.  Otherwise, unremarkable. Musculoskeletal/Soft Tissues: There are no aggressive appearing lytic or blastic lesions noted in the visualized portions of the skeleton. IMPRESSION: 1. Small right-sided pneumothorax with right-sided chest tube in place, as above. 2. A combination of atelectasis and consolidative changes are present in the right lower lobe, suspicious for resolving right lower lobe pneumonia. 3. Partial atelectasis of the right middle lobe. 4. Atherosclerosis, including left main and 3 vessel coronary artery disease. Assessment for potential risk factor modification, dietary therapy or pharmacologic therapy may be warranted, if clinically indicated. 5. There are calcifications of the aortic valve. Echocardiographic correlation for evaluation of potential valvular dysfunction may be warranted if clinically indicated. 6. Moderate-sized hiatal hernia. Electronically Signed   By: Vinnie Langton M.D.   On: 05/18/2015 16:44   Dg Chest Port 1 View  05/19/2015  CLINICAL DATA:  Right chest tube. EXAM: PORTABLE CHEST 1 VIEW COMPARISON:  CT 05/18/2015.  Chest x-ray 05/18/2015 . FINDINGS: Right chest tube in stable position. Mediastinum and hilar structures are normal. Mild right base subsegmental atelectasis. No pleural effusion or pneumothorax. Stable cardiomegaly. No acute osseus abnormality . IMPRESSION: 1. Right chest tube in stable position.  No pneumothorax. 2. Stable right base subsegmental atelectasis. Electronically Signed   By: Marcello Moores  Register   On: 05/19/2015 07:24   Dg Chest Port 1 View  05/18/2015  CLINICAL DATA:  RIGHT-sided chest tube post surgery EXAM: PORTABLE CHEST 1 VIEW COMPARISON:  Portable exam 0546 hours compared to 05/16/2015 FINDINGS: RIGHT basilar  thoracostomy tube unchanged. Enlargement of cardiac silhouette. Mediastinal contours and pulmonary vascularity normal. Mild RIGHT basilar subsegmental atelectasis. Remaining lungs clear. No pleural effusion or pneumothorax. IMPRESSION: RIGHT thoracostomy tube without pneumothorax. Subsegmental atelectasis RIGHT base. Electronically Signed   By: Lavonia Dana M.D.   On: 05/18/2015 07:18    Scheduled Meds: . amLODipine  5 mg Oral Daily  . aspirin EC  81 mg Oral Daily  . atorvastatin  20 mg Oral QHS  . doxycycline  100 mg Oral BID  . lisinopril  20 mg Oral Daily    Assessment/Plan:  1. Essential hypertension- I added Norvasc yesterday and increased dose of lisinopril. Blood pressure trended better yesterday but is up this morning. I asked the nurse to give the Norvasc early. If blood pressure still elevated this afternoon will increase the dose of Norvasc. 2. Hyperlipidemia unspecified continue atorvastatin 3. Tension pneumothorax- chest tube as per Dr. Jamal Collin 4. Questionable pneumonia seen on CT scan of the chest but not seen on follow-up chest x-rays- patient is on doxycycline. 5. Slight hemoptysis- could be with a tension pneumothorax and possible pneumonia.  Code Status:     Code Status Orders        Start     Ordered   05/15/15 1633  Full code   Continuous     05/15/15 1632  Advance Directive Documentation        Most Recent Value   Type of Advance Directive  Living will   Pre-existing out of facility DNR order (yellow form or pink MOST form)     "MOST" Form in Place?       Family Communication: Spoke with wife yesterday Disposition Plan: Patient will need to be in the hospital while chest tube is in  Antibiotics:  Doxycycline  Time spent:  20 minutes  [place], [place] [facility]

## 2015-05-22 ENCOUNTER — Encounter: Payer: Self-pay | Admitting: General Surgery

## 2015-05-22 ENCOUNTER — Ambulatory Visit
Admission: RE | Admit: 2015-05-22 | Discharge: 2015-05-22 | Disposition: A | Discharge status: Home / Self Care | Payer: Medicare Other | Source: Ambulatory Visit | Attending: General Surgery | Admitting: General Surgery

## 2015-05-22 ENCOUNTER — Ambulatory Visit (INDEPENDENT_AMBULATORY_CARE_PROVIDER_SITE_OTHER): Payer: Medicare Other | Admitting: General Surgery

## 2015-05-22 VITALS — BP 128/64 | HR 86 | Resp 16 | Ht 72.0 in | Wt 211.0 lb

## 2015-05-22 DIAGNOSIS — R918 Other nonspecific abnormal finding of lung field: Secondary | ICD-10-CM | POA: Diagnosis not present

## 2015-05-22 DIAGNOSIS — K449 Diaphragmatic hernia without obstruction or gangrene: Secondary | ICD-10-CM | POA: Insufficient documentation

## 2015-05-22 DIAGNOSIS — J9383 Other pneumothorax: Secondary | ICD-10-CM | POA: Insufficient documentation

## 2015-05-22 NOTE — Progress Notes (Signed)
Patient ID: Zachary [name], male   DOB: [birth year], 74 y.o.   MRN: [id number]  Chief Complaint  Patient presents with  . Follow-up    HPI Zachary [name] is a 74 y.o. male here today for a follow up for a pneumothorax. Patient was admitted to [facility] with a spontaneous right pneumothorax.on 05/15/15. Chest tube was placed. He was discharged on [id number] on Heimlich valve. During hospital stay his BP meds were adjusted as hi pressures were high. H eonly c/o feeliong a little weak. No chest pain or sob. I have reviewed the history of present illness with the patient.  HPI  Past Medical History  Diagnosis Date  . Hypertension   . Hypercholesterolemia     Past Surgical History  Procedure Laterality Date  . Appendectomy    . Cataract extraction Bilateral     Family History  Problem Relation Age of Onset  . Hypertension Mother   . Hyperlipidemia Mother   . Heart attack Father   . Hypertension Father   . CVA Father   . ALS Brother   . Prostate cancer Brother     Social History Social History  Substance Use Topics  . Smoking status: Never Smoker   . Smokeless tobacco: Never Used  . Alcohol Use: No    No Known Allergies  Current Outpatient Prescriptions  Medication Sig Dispense Refill  . amLODipine (NORVASC) 5 MG tablet Take 1 tablet (5 mg total) by mouth daily. 30 tablet 0  . aspirin EC 81 MG tablet Take 81 mg by mouth at bedtime.    Marland Kitchen atorvastatin (LIPITOR) 20 MG tablet Take 20 mg by mouth at bedtime.    Marland Kitchen lisinopril (PRINIVIL,ZESTRIL) 20 MG tablet Take 1 tablet (20 mg total) by mouth daily. 30 tablet 0  . oxyCODONE (OXY IR/ROXICODONE) 5 MG immediate release tablet Take 1-2 tablets (5-10 mg total) by mouth every 4 (four) hours as needed for moderate pain. 30 tablet 0   No current facility-administered medications for this visit.    Review of Systems Review of Systems  Constitutional: Negative.   Respiratory: Negative.   Cardiovascular: Negative.     Blood  pressure 128/64, pulse 86, resp. rate 16, height 6' (1.829 m), weight 211 lb (95.709 kg), SpO2 95 %.  Physical Exam Physical Exam  Constitutional: He is oriented to person, place, and time. He appears well-developed and well-nourished.  Cardiovascular: Normal rate, regular rhythm and normal heart sounds.   Pulmonary/Chest: Effort normal and breath sounds normal.  Abdominal: Soft. Bowel sounds are normal.  Neurological: He is alert and oriented to person, place, and time.  Skin: Skin is warm and dry.  Chest tube and valve are intact, no apparent air leak noted.  Data Reviewed CXR today showed no pneumo. CT done in [facility] last week showed no bullous disease.  Assessment  Right pneumothorax resolved.    Plan   Chest tube and stitches were removed and sealed dressing placed.  Will follow up on Wednesday 05/24/15 with a chest xray.   This information has been scribed by Verlene Mayer, CMA        PCP:  Cranford Mon, [place] Breeding G 05/23/2015, 8:36 AM

## 2015-05-22 NOTE — Patient Instructions (Signed)
Call with any questions or concerns. Follow up on Wednesday chest xray prior.

## 2015-05-23 ENCOUNTER — Encounter: Payer: Self-pay | Admitting: General Surgery

## 2015-05-24 ENCOUNTER — Ambulatory Visit (INDEPENDENT_AMBULATORY_CARE_PROVIDER_SITE_OTHER): Payer: Medicare Other | Admitting: Family Medicine

## 2015-05-24 ENCOUNTER — Encounter: Payer: Self-pay | Admitting: Family Medicine

## 2015-05-24 ENCOUNTER — Ambulatory Visit
Admission: RE | Admit: 2015-05-24 | Discharge: 2015-05-24 | Disposition: A | Discharge status: Home / Self Care | Payer: Medicare Other | Source: Ambulatory Visit | Attending: General Surgery | Admitting: General Surgery

## 2015-05-24 ENCOUNTER — Ambulatory Visit (INDEPENDENT_AMBULATORY_CARE_PROVIDER_SITE_OTHER): Payer: Medicare Other | Admitting: General Surgery

## 2015-05-24 ENCOUNTER — Encounter: Payer: Self-pay | Admitting: General Surgery

## 2015-05-24 VITALS — BP 128/80 | HR 80 | Temp 97.9°F | Resp 18 | Wt 195.0 lb

## 2015-05-24 VITALS — BP 118/80 | HR 82 | Resp 16 | Ht 74.0 in | Wt 194.0 lb

## 2015-05-24 DIAGNOSIS — I10 Essential (primary) hypertension: Secondary | ICD-10-CM | POA: Diagnosis not present

## 2015-05-24 DIAGNOSIS — R918 Other nonspecific abnormal finding of lung field: Secondary | ICD-10-CM | POA: Insufficient documentation

## 2015-05-24 DIAGNOSIS — J939 Pneumothorax, unspecified: Secondary | ICD-10-CM | POA: Diagnosis not present

## 2015-05-24 DIAGNOSIS — J9383 Other pneumothorax: Secondary | ICD-10-CM

## 2015-05-24 DIAGNOSIS — Z09 Encounter for follow-up examination after completed treatment for conditions other than malignant neoplasm: Secondary | ICD-10-CM | POA: Insufficient documentation

## 2015-05-24 MED ORDER — LISINOPRIL 40 MG PO TABS
40.0000 mg | ORAL_TABLET | Freq: Every day | ORAL | Status: DC
Start: 2015-05-24 — End: 2016-05-13

## 2015-05-24 NOTE — Progress Notes (Signed)
Patient ID: Zachary [name], male   DOB: 1940/06/12, 74 y.o.   MRN: [id number]       Patient: Zachary [name] Male    DOB: 09/14/1940   74 y.o.   MRN: [id number] Visit Date: 05/24/2015  Today's Provider: [name], MD   Chief Complaint  Patient presents with  . Hypertension  . Spontaneous Pneumothorax   Subjective:    Hypertension This is a chronic problem. The problem is controlled. Associated symptoms include malaise/fatigue. Pertinent negatives include no anxiety, blurred vision, chest pain, headaches, orthopnea, palpitations, peripheral edema, PND or shortness of breath. There are no compliance problems.    Pneumothorax on right lung    Patient reports that he was D/C from the hospital on 05/19/2015. He reports that his breathing is much better. Denies any shortness of breath. However, patient reports that he does still very fatigued. Patient reports that there has not been any changes in his medications. He has a F/U appt with Dr. Jamal Collin in 2 weeks.   No Known Allergies Previous Medications   AMLODIPINE (NORVASC) 5 MG TABLET    Take 1 tablet (5 mg total) by mouth daily.   ASPIRIN EC 81 MG TABLET    Take 81 mg by mouth at bedtime.   ATORVASTATIN (LIPITOR) 20 MG TABLET    Take 20 mg by mouth at bedtime.   LISINOPRIL (PRINIVIL,ZESTRIL) 20 MG TABLET    Take 1 tablet (20 mg total) by mouth daily.   OXYCODONE (OXY IR/ROXICODONE) 5 MG IMMEDIATE RELEASE TABLET    Take 1-2 tablets (5-10 mg total) by mouth every 4 (four) hours as needed for moderate pain.    Review of Systems  Constitutional: Positive for malaise/fatigue and activity change.  Eyes: Negative for blurred vision.  Respiratory: Negative.  Negative for shortness of breath.   Cardiovascular: Negative.  Negative for chest pain, palpitations, orthopnea and PND.  Neurological: Negative for headaches.    Social History  Substance Use Topics  . Smoking status: Never Smoker   . Smokeless tobacco: Never Used    . Alcohol Use: No   Objective:   BP 128/80 mmHg  Pulse 80  Temp(Src) 97.9 F (36.6 C)  Resp 18  Wt 195 lb (88.451 kg)  Physical Exam  Constitutional: He is oriented to person, place, and time. He appears well-developed and well-nourished.  HENT:  Head: Atraumatic.  Right Ear: External ear normal.  Left Ear: External ear normal.  Nose: Nose normal.  Eyes: Conjunctivae are normal.  Neck: Neck supple.  Cardiovascular: Normal rate, regular rhythm and normal heart sounds.   Pulmonary/Chest: Effort normal and breath sounds normal.  Abdominal: Soft.  Neurological: He is alert and oriented to person, place, and time.  Mild tremor of mouth.  Skin: Skin is warm and dry.  Psychiatric: He has a normal mood and affect. His behavior is normal. Judgment and thought content normal.        Assessment & Plan:     HTN Double Lisinopril from 20 to 40mg  daily. RTC 1 month. Pneumothorax Resolved.  I have done the exam and reviewed the above chart and it is accurate to the best of my knowledge.       Aarron Wierzbicki Cranford Mon, MD  [facility]

## 2015-05-24 NOTE — Patient Instructions (Addendum)
The patient is aware to call back for any questions or concerns. Follow up in 10 days with repeat CXR. He is to call the office for any chest pain or shortness of breath.

## 2015-05-24 NOTE — Progress Notes (Signed)
This is a 74 year old male here today for his follow up pneumothorax. Patient was admitted to [facility] with a spontaneous right pneumothorax.on 05/15/15.Chest tube removed 05-22-15 Patient had a x-ray done on 05/24/15. He states he is doing well, no shortness of breath. I have reviewed the history of present illness with the patient.  Lungs are clear, heart normal and trachea is central. Chest tube site is clean and CXR shows no pneumothorax only minimal blunting of right costophrinic angle. Follow up in 10 days with repeat CXR. He is to call the office for any chest pain or shortness of breath.  This information has been scribed by Karie Fetch RNBC. PCP:  Cranford Mon, Richard L

## 2015-06-06 ENCOUNTER — Ambulatory Visit
Admission: RE | Admit: 2015-06-06 | Discharge: 2015-06-06 | Disposition: A | Discharge status: Home / Self Care | Payer: Medicare Other | Source: Ambulatory Visit | Attending: General Surgery | Admitting: General Surgery

## 2015-06-06 ENCOUNTER — Other Ambulatory Visit: Payer: Self-pay

## 2015-06-06 DIAGNOSIS — I517 Cardiomegaly: Secondary | ICD-10-CM | POA: Insufficient documentation

## 2015-06-06 DIAGNOSIS — K449 Diaphragmatic hernia without obstruction or gangrene: Secondary | ICD-10-CM | POA: Diagnosis not present

## 2015-06-06 DIAGNOSIS — R0789 Other chest pain: Secondary | ICD-10-CM

## 2015-06-06 NOTE — Progress Notes (Signed)
Spoke with patient's wife, Glorious Peach, and patient will get a chest x ray today at [facility] location.

## 2015-06-07 ENCOUNTER — Encounter: Payer: Self-pay | Admitting: General Surgery

## 2015-06-07 ENCOUNTER — Ambulatory Visit (INDEPENDENT_AMBULATORY_CARE_PROVIDER_SITE_OTHER): Payer: Medicare Other | Admitting: General Surgery

## 2015-06-07 VITALS — BP 130/74 | HR 62 | Resp 14 | Ht 74.0 in | Wt 195.0 lb

## 2015-06-07 DIAGNOSIS — R109 Unspecified abdominal pain: Secondary | ICD-10-CM

## 2015-06-07 DIAGNOSIS — J9383 Other pneumothorax: Secondary | ICD-10-CM | POA: Diagnosis not present

## 2015-06-07 MED ORDER — MELOXICAM 7.5 MG PO TABS: 7.5000 mg | ORAL_TABLET | Freq: Every day | ORAL | Status: DC

## 2015-06-07 NOTE — Progress Notes (Signed)
This is a 75 year old male here today for his follow up pneumothorax. Patient was admitted to [facility] with a spontaneous right pneumothorax.on 05/15/15.Chest tube removed 05-22-15 Patient had a x-ray done on 06/05/14. He states he is doing well, no shortness of breath. He does notice right sided sharp pains when he takes a deep breath or coughs for the past 3 days. I have reviewed the history of present illness with the patient.  He is in NAD, appears comfortable.  Chest x ray reviewed,no pneumothorax noted. Minimal blunting in right CP angle.  Chest tube site well healed. Trachea is central No subcutaneous air. Lungs are clear.  Heart -nsr. Abdomen is soft, non tender.  Pain is located along right iliac crest, no hernia noted or palpable nodule.    Impression: Spontaneous pneumothorax resolved. New onset pain in right iliac crest area- not sure of cause. Appears likely musculoskeltal. Plan:Follow up in 1-2 weeks after trial of Mobic 7.5 mg one daily #  PCP:  Cranford Mon, Richard L This information has been scribed by Karie Fetch RNBC.

## 2015-06-07 NOTE — Patient Instructions (Signed)
The patient is aware to call back for any questions or concerns.  

## 2015-06-21 ENCOUNTER — Ambulatory Visit (INDEPENDENT_AMBULATORY_CARE_PROVIDER_SITE_OTHER): Payer: Medicare Other | Admitting: General Surgery

## 2015-06-21 ENCOUNTER — Encounter: Payer: Self-pay | Admitting: General Surgery

## 2015-06-21 DIAGNOSIS — J9383 Other pneumothorax: Secondary | ICD-10-CM

## 2015-06-21 NOTE — Patient Instructions (Signed)
Patient to return as needed. 

## 2015-06-21 NOTE — Progress Notes (Signed)
This is a 75 year old male here today for his follow up pneumothorax. Patient was admitted to [facility] with a spontaneous right pneumothorax.on 05/15/15.Chest tube removed 05-22-15. Patient states he is doing well. A week later he had some radicular pain along the lower right chest which has since resolved with the use of Mobic.  I have reviewed the history of present illness with the patient.  He is in NAD, appears comfortable.Chest tube site well healed. No subcutaneous air. Lungs are clear.  Heart -nsr. Abdomen is soft, non tender.  No hernia noted or palpable nodule.  Patient to return as needed.  PCP:  Cranford Mon, Richard L This information has been scribed by Gaspar Cola CMA.

## 2015-07-10 ENCOUNTER — Ambulatory Visit (INDEPENDENT_AMBULATORY_CARE_PROVIDER_SITE_OTHER): Payer: Medicare Other | Admitting: Family Medicine

## 2015-07-10 ENCOUNTER — Encounter: Payer: Self-pay | Admitting: Family Medicine

## 2015-07-10 VITALS — BP 112/78 | HR 56 | Temp 97.5°F | Resp 14 | Wt 201.6 lb

## 2015-07-10 DIAGNOSIS — J069 Acute upper respiratory infection, unspecified: Secondary | ICD-10-CM

## 2015-07-10 MED ORDER — HYDROCODONE-HOMATROPINE 5-1.5 MG/5ML PO SYRP: ORAL_SOLUTION | ORAL | Status: DC

## 2015-07-10 NOTE — Patient Instructions (Signed)
Discussed use of Mucinex D for congestion and Delsym for cough. 

## 2015-07-10 NOTE — Progress Notes (Signed)
Subjective:     Patient ID: Zachary [name], male   DOB: 1940-10-07, 75 y.o.   MRN: [id number]  HPI  Chief Complaint  Patient presents with  . Cough    Patient comes in office today with concerns of cough and congestion since Friday 2/3. Patient states that cough is productive of white/clear phlegm but denies any other cold/URI symptoms  Reports mild sinus congestion and sore throat. +flu shot.   Review of Systems  Respiratory:       Hx of spontaneous pneumothorax       Objective:   Physical Exam  Constitutional: He appears well-developed and well-nourished. No distress.  Ears: T.M's intact without inflammation Throat: no tonsillar enlargement or exudate Neck: no cervical adenopathy Lungs: clear     Assessment:    1. Upper respiratory infection - HYDROcodone-homatropine (HYCODAN) 5-1.5 MG/5ML syrup; 5 ml 4-6 hours as needed for cough  Dispense: 240 mL; Refill: 0    Plan:    Discussed use of Mucinex D and Delsym.

## 2015-07-12 ENCOUNTER — Encounter: Payer: Self-pay | Admitting: Family Medicine

## 2015-07-26 ENCOUNTER — Ambulatory Visit (INDEPENDENT_AMBULATORY_CARE_PROVIDER_SITE_OTHER): Payer: Medicare Other | Admitting: Family Medicine

## 2015-07-26 ENCOUNTER — Encounter: Payer: Self-pay | Admitting: Family Medicine

## 2015-07-26 VITALS — BP 122/78 | HR 58 | Temp 97.8°F | Resp 16 | Ht 74.5 in | Wt 202.0 lb

## 2015-07-26 DIAGNOSIS — Z1211 Encounter for screening for malignant neoplasm of colon: Secondary | ICD-10-CM | POA: Diagnosis not present

## 2015-07-26 DIAGNOSIS — J939 Pneumothorax, unspecified: Secondary | ICD-10-CM | POA: Diagnosis not present

## 2015-07-26 DIAGNOSIS — Z23 Encounter for immunization: Secondary | ICD-10-CM

## 2015-07-26 DIAGNOSIS — I10 Essential (primary) hypertension: Secondary | ICD-10-CM | POA: Diagnosis not present

## 2015-07-26 DIAGNOSIS — Z0000 Encounter for general adult medical examination without abnormal findings: Secondary | ICD-10-CM

## 2015-07-26 DIAGNOSIS — E785 Hyperlipidemia, unspecified: Secondary | ICD-10-CM | POA: Diagnosis not present

## 2015-07-26 LAB — IFOBT (OCCULT BLOOD): IFOBT: NEGATIVE

## 2015-07-26 NOTE — Progress Notes (Signed)
Patient ID: Zachary [name], male   DOB: [birth year], 75 y.o.   MRN: [id number]       Patient: Zachary [name], Male    DOB: April 16, 1941, 75 y.o.   MRN: [id number] Visit Date: 07/26/2015  Today's Provider: [name], MD   Chief Complaint  Patient presents with  . medicare wellness exam   Subjective:    Annual wellness visit Zachary [name] is a 75 y.o. male. He feels well. He reports exercising 3 times a week. He is very active. 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  He reports he is sleeping well. He sleeps on average 8-9 hours a night.  Last:  Colonoscopy- 03/11/2011. Diverticulosis. Repeat in 5 years. Due in 03/2016.   Pneumovax- 04/07/1996  Prevnar-06/09/2014  Tdap-06/09/2014   Zoster-06/30/2013  Patient needs follow-up lab work for his blood pressure and cholesterol. He is feeling much better since his treatment for spontaneous pneumothorax. -----------------------------------------------------------   Review of Systems  Constitutional: Negative.   HENT: Negative.   Eyes: Negative.   Respiratory: Negative.   Cardiovascular: Negative.   Gastrointestinal: Negative.   Endocrine: Negative.   Genitourinary: Negative.   Musculoskeletal: Negative.   Skin: Negative.   Allergic/Immunologic: Negative.   Neurological: Negative.   Hematological: Negative.   Psychiatric/Behavioral: Negative.     Social History   Social History  . Marital Status: Married    Spouse Name: N/A  . Number of Children: N/A  . Years of Education: N/A   Occupational History  . Not on file.   Social History Main Topics  . Smoking status: Never Smoker   . Smokeless tobacco: Never Used  . Alcohol Use: No  . Drug Use: No  . Sexual Activity: Not on file   Other Topics Concern  . Not on file   Social History Narrative    Past Medical History  Diagnosis Date  . Hypertension   . Hypercholesterolemia      Patient Active Problem List   Diagnosis Date Noted  . Pneumothorax  on right 05/15/2015  . Benign essential tremor 02/28/2015  . HLD (hyperlipidemia) 02/28/2015  . BP (high blood pressure) 02/28/2015  . Hypercholesterolemia without hypertriglyceridemia 02/28/2015  . Head revolving around 02/28/2015    Past Surgical History  Procedure Laterality Date  . Appendectomy    . Cataract extraction Bilateral   . Pleural scarification  05/2015    His family history includes ALS in his brother; CVA in his father; Heart attack in his father; Hyperlipidemia in his mother; Hypertension in his father and mother; Prostate cancer in his brother.    Previous Medications   ASPIRIN EC 81 MG TABLET    Take 81 mg by mouth at bedtime.   ATORVASTATIN (LIPITOR) 20 MG TABLET    Take 20 mg by mouth at bedtime.   HYDROCODONE-HOMATROPINE (HYCODAN) 5-1.5 MG/5ML SYRUP    5 ml 4-6 hours as needed for cough   LISINOPRIL (PRINIVIL,ZESTRIL) 40 MG TABLET    Take 1 tablet (40 mg total) by mouth daily.    Patient Care Team: Jerrol Banana., MD as PCP - General (Family Medicine) Seeplaputhur Robinette Haines, MD (General Surgery) Jerrol Banana., MD (Family Medicine)     Objective:   Vitals: BP 122/78 mmHg  Pulse 58  Temp(Src) 97.8 F (36.6 C)  Resp 16  Ht 6' 2.5" (1.892 m)  Wt 202 lb (91.627 kg)  BMI 25.60 kg/m2  Physical Exam  Constitutional: He is oriented to  person, place, and time. He appears well-developed and well-nourished.  HENT:  Head: Normocephalic and atraumatic.  Right Ear: External ear normal.  Left Ear: External ear normal.  Nose: Nose normal.  Mouth/Throat: Oropharynx is clear and moist.  Eyes: Conjunctivae and EOM are normal. Pupils are equal, round, and reactive to light.  Neck: Normal range of motion. Neck supple.  Cardiovascular: Normal rate, regular rhythm, normal heart sounds and intact distal pulses.   Pulmonary/Chest: Effort normal. He has wheezes.  3 mild expiratory wheezes.  Abdominal: Soft. Bowel sounds are normal.  Genitourinary:  Guaiac negative stool.  Musculoskeletal: Normal range of motion. He exhibits edema.  Trace lower extremity edema  Neurological: He is alert and oriented to person, place, and time. He has normal reflexes. No cranial nerve deficit. He exhibits normal muscle tone. Coordination normal.  Mild twitching around the mouth. I have noticed this in the past 6-12 months. Patient has not noticed it.  Skin: Skin is warm and dry.  Psychiatric: He has a normal mood and affect. His behavior is normal. Judgment and thought content normal.  Nursing note and vitals reviewed.   Activities of Daily Living In your present state of health, do you have any difficulty performing the following activities: 07/26/2015 05/15/2015  Hearing? N -  Vision? N -  Difficulty concentrating or making decisions? N -  Walking or climbing stairs? N -  Dressing or bathing? N -  Doing errands, shopping? N N    Fall Risk Assessment Fall Risk  07/26/2015 05/15/2015  Falls in the past year? No No     Depression Screen PHQ 2/9 Scores 07/26/2015 05/15/2015  PHQ - 2 Score 0 0    Cognitive Testing - 6-CIT  Correct? Score   What year is it? yes 0 0 or 4  What month is it? yes 0 0 or 3  Memorize:    Pia Mau,  [birth year],  [address],  [place],      What time is it? (within 1 hour) yes 0 0 or 3  Count backwards from 20 yes 0 0, 2, or 4  Name the months of the year yes 0 0, 2, or 4  Repeat name & address above yes 0 0, 2, 4, 6, 8, or 10       TOTAL SCORE  0/28   Interpretation:  Normal  Normal (0-7) Abnormal (8-28)       Assessment & Plan:     Annual Wellness Visit  Reviewed patient's Family Medical History Reviewed and updated list of patient's medical providers Assessment of cognitive impairment was done Assessed patient's functional ability Established a written schedule for health screening [place] Completed and Reviewed  Exercise Activities and Dietary recommendations Goals    None       Immunization History  Administered Date(s) Administered  . Influenza, High Dose Seasonal PF 03/01/2015  . Pneumococcal Conjugate-13 06/09/2014  . Pneumococcal Polysaccharide-23 04/07/1996, 07/26/2015  . Td 02/10/2002  . Tdap 06/09/2014  . Zoster 06/30/2013    Health Maintenance  Topic Date Due  . INFLUENZA VACCINE  01/02/2016  . COLONOSCOPY  03/10/2021  . TETANUS/TDAP  06/09/2024  . ZOSTAVAX  Completed  . PNA vac Low Risk Adult  Completed      Discussed health benefits of physical activity, and encouraged him to engage in regular exercise appropriate for his age and condition.    1. Medicare annual wellness visit, subsequent  2. Pneumothorax on right - Spirometry with graph  Normal spirometry. 3. Need for pneumococcal vaccination - Pneumococcal polysaccharide vaccine 23-valent greater than or equal to 2yo subcutaneous/IM  4. Essential hypertension Stable. F/U pending labs. - CBC with Differential/Platelet - Comprehensive metabolic panel - TSH  5. HLD (hyperlipidemia) - CBC with Differential/Platelet - Lipid panel - TSH  6. Colon cancer screening - IFOBT POC (occult bld, rslt in office) 7.. Oral twitching Patient declines neurology referral but may do this later on 2017 I have done the exam and reviewed the above chart and it is accurate to the best of my knowledge.

## 2015-07-27 LAB — CBC WITH DIFFERENTIAL/PLATELET
Basophils Absolute: 0 10*3/uL (ref 0.0–0.2)
Basos: 1 %
EOS (ABSOLUTE): 0.2 10*3/uL (ref 0.0–0.4)
Eos: 4 %
HEMOGLOBIN: 13.8 g/dL (ref 12.6–17.7)
Hematocrit: 40.1 % (ref 37.5–51.0)
IMMATURE GRANS (ABS): 0 10*3/uL (ref 0.0–0.1)
IMMATURE GRANULOCYTES: 1 %
Lymphocytes Absolute: 0.9 10*3/uL (ref 0.7–3.1)
Lymphs: 20 %
MCH: 32.5 pg (ref 26.6–33.0)
MCHC: 34.4 g/dL (ref 31.5–35.7)
MCV: 95 fL (ref 79–97)
Monocytes Absolute: 0.4 10*3/uL (ref 0.1–0.9)
Monocytes: 10 %
NEUTROS ABS: 2.9 10*3/uL (ref 1.4–7.0)
NEUTROS PCT: 64 %
PLATELETS: 214 10*3/uL (ref 150–379)
RBC: 4.24 x10E6/uL (ref 4.14–5.80)
RDW: 13.5 % (ref 12.3–15.4)
WBC: 4.4 10*3/uL (ref 3.4–10.8)

## 2015-07-27 LAB — COMPREHENSIVE METABOLIC PANEL
ALBUMIN: 4 g/dL (ref 3.5–4.8)
ALT: 15 IU/L (ref 0–44)
AST: 27 IU/L (ref 0–40)
Albumin/Globulin Ratio: 2 (ref 1.1–2.5)
Alkaline Phosphatase: 74 IU/L (ref 39–117)
BILIRUBIN TOTAL: 0.8 mg/dL (ref 0.0–1.2)
BUN / CREAT RATIO: 12 (ref 10–22)
BUN: 12 mg/dL (ref 8–27)
CALCIUM: 9.1 mg/dL (ref 8.6–10.2)
CO2: 22 mmol/L (ref 18–29)
CREATININE: 0.99 mg/dL (ref 0.76–1.27)
Chloride: 106 mmol/L (ref 96–106)
GFR, EST AFRICAN AMERICAN: 88 mL/min/{1.73_m2} (ref >59)
GFR, EST NON AFRICAN AMERICAN: 76 mL/min/{1.73_m2} (ref >59)
GLUCOSE: 84 mg/dL (ref 65–99)
Globulin, Total: 2 g/dL (ref 1.5–4.5)
Potassium: 4.8 mmol/L (ref 3.5–5.2)
Sodium: 142 mmol/L (ref 134–144)
TOTAL PROTEIN: 6 g/dL (ref 6.0–8.5)

## 2015-07-27 LAB — LIPID PANEL
CHOL/HDL RATIO: 2.6 ratio (ref 0.0–5.0)
Cholesterol, Total: 182 mg/dL (ref 100–199)
HDL: 70 mg/dL (ref >39)
LDL CALC: 98 mg/dL (ref 0–99)
Triglycerides: 68 mg/dL (ref 0–149)
VLDL CHOLESTEROL CAL: 14 mg/dL (ref 5–40)

## 2015-07-27 LAB — TSH: TSH: 3.73 u[IU]/mL (ref 0.450–4.500)

## 2015-07-31 ENCOUNTER — Telehealth: Payer: Self-pay

## 2015-07-31 NOTE — Telephone Encounter (Signed)
-----   Message from Jerrol Banana., MD sent at 07/30/2015 12:15 PM EST ----- Labs okay.

## 2015-07-31 NOTE — Telephone Encounter (Signed)
Advised patient of results.  

## 2015-07-31 NOTE — Telephone Encounter (Signed)
Left message to call back  

## 2015-11-01 ENCOUNTER — Ambulatory Visit (INDEPENDENT_AMBULATORY_CARE_PROVIDER_SITE_OTHER): Payer: Medicare Other | Admitting: Family Medicine

## 2015-11-01 ENCOUNTER — Encounter: Payer: Self-pay | Admitting: Family Medicine

## 2015-11-01 VITALS — BP 108/64 | HR 58 | Temp 97.8°F | Resp 14 | Wt 201.0 lb

## 2015-11-01 DIAGNOSIS — E785 Hyperlipidemia, unspecified: Secondary | ICD-10-CM | POA: Diagnosis not present

## 2015-11-01 DIAGNOSIS — I10 Essential (primary) hypertension: Secondary | ICD-10-CM

## 2015-11-01 NOTE — Progress Notes (Signed)
Patient ID: Zachary [name], male   DOB: 21-Oct-1940, 75 y.o.   MRN: [id number]    Subjective:  HPI  Hypertension, follow-up:  BP Readings from Last 3 Encounters:  11/01/15 108/64  07/26/15 122/78  07/10/15 112/78    He was last seen for hypertension 3 months ago.  BP at that visit was 122/78. Management since that visit includes none. He reports good compliance with treatment. He is not having side effects.  He is exercising. He is adherent to low salt diet.   Outside blood pressures are not being checked. He is experiencing none.  Patient denies chest pain, chest pressure/discomfort, claudication, dyspnea, exertional chest pressure/discomfort, fatigue, irregular heart beat, lower extremity edema, near-syncope, orthopnea, palpitations and paroxysmal nocturnal dyspnea.   Cardiovascular risk factors include advanced age (older than 28 for men, 2 for women), dyslipidemia, hypertension and male gender.   Wt Readings from Last 3 Encounters:  11/01/15 201 lb (91.173 kg)  07/26/15 202 lb (91.627 kg)  07/10/15 201 lb 9.6 oz (91.445 kg)   ------------------------------------------------------------------------    Lipid/Cholesterol, Follow-up:   Last seen for this3 months ago.  Management changes since that visit include none. . Last Lipid Panel:    Component Value Date/Time   CHOL 182 07/26/2015 1026   CHOL 183 08/31/2014   TRIG 68 07/26/2015 1026   HDL 70 07/26/2015 1026   HDL 69 08/31/2014   CHOLHDL 2.6 07/26/2015 1026   LDLCALC 98 07/26/2015 1026   LDLCALC 100 08/31/2014    He reports good compliance with treatment. He is not having side effects.  Current symptoms include none and have been unchanged. Weight trend: stable  Wt Readings from Last 3 Encounters:  11/01/15 201 lb (91.173 kg)  07/26/15 202 lb (91.627 kg)  07/10/15 201 lb 9.6 oz (91.445 kg)    ------------------------------------------------------------------- Last labs 07/26/15   Prior to  Admission medications   Medication Sig Start Date End Date Taking? Authorizing Provider  aspirin EC 81 MG tablet Take 81 mg by mouth at bedtime.   Yes Historical Provider, MD  atorvastatin (LIPITOR) 20 MG tablet Take 20 mg by mouth at bedtime.   Yes Historical Provider, MD  lisinopril (PRINIVIL,ZESTRIL) 40 MG tablet Take 1 tablet (40 mg total) by mouth daily. 05/24/15  Yes Richard Maceo Pro., MD    Patient Active Problem List   Diagnosis Date Noted  . Pneumothorax on right 05/15/2015  . Benign essential tremor 02/28/2015  . HLD (hyperlipidemia) 02/28/2015  . BP (high blood pressure) 02/28/2015  . Hypercholesterolemia without hypertriglyceridemia 02/28/2015  . Head revolving around 02/28/2015    Past Medical History  Diagnosis Date  . Hypertension   . Hypercholesterolemia     Social History   Social History  . Marital Status: Married    Spouse Name: N/A  . Number of Children: N/A  . Years of Education: N/A   Occupational History  . Not on file.   Social History Main Topics  . Smoking status: Never Smoker   . Smokeless tobacco: Never Used  . Alcohol Use: No  . Drug Use: No  . Sexual Activity: Not on file   Other Topics Concern  . Not on file   Social History Narrative    No Known Allergies  Review of Systems  Constitutional: Negative.   HENT: Negative.   Eyes: Negative.   Respiratory: Negative.   Cardiovascular: Negative.   Gastrointestinal: Negative.   Genitourinary: Negative.   Musculoskeletal: Negative.   Skin: Negative.  Neurological: Negative.   Endo/Heme/Allergies: Negative.   Psychiatric/Behavioral: Negative.     Immunization History  Administered Date(s) Administered  . Influenza, High Dose Seasonal PF 03/01/2015  . Pneumococcal Conjugate-13 06/09/2014  . Pneumococcal Polysaccharide-23 04/07/1996, 07/26/2015  . Td 02/10/2002  . Tdap 06/09/2014  . Zoster 06/30/2013   Objective:  BP 108/64 mmHg  Pulse 58  Temp(Src) 97.8 F (36.6 C)  (Oral)  Resp 14  Wt 201 lb (91.173 kg)  Physical Exam  Constitutional: He is oriented to person, place, and time and well-developed, well-nourished, and in no distress.  HENT:  Head: Normocephalic and atraumatic.  Right Ear: External ear normal.  Left Ear: External ear normal.  Nose: Nose normal.  Eyes: Conjunctivae are normal.  Neck: Neck supple.  Cardiovascular: Normal rate, regular rhythm and normal heart sounds.   Pulmonary/Chest: Effort normal and breath sounds normal.  Abdominal: Soft.  Neurological: He is alert and oriented to person, place, and time. Gait normal.  Skin: Skin is warm and dry.  Psychiatric: Mood, memory, affect and judgment normal.    Lab Results  Component Value Date   WBC 4.4 07/26/2015   HGB 14.1 05/15/2015   HCT 40.1 07/26/2015   PLT 214 07/26/2015   GLUCOSE 84 07/26/2015   CHOL 182 07/26/2015   TRIG 68 07/26/2015   HDL 70 07/26/2015   LDLCALC 98 07/26/2015   TSH 3.730 07/26/2015   PSA 1.2 06/09/2014   HGBA1C 5.2 05/19/2015    CMP     Component Value Date/Time   NA 142 07/26/2015 1026   NA 139 05/15/2015 1327   K 4.8 07/26/2015 1026   CL 106 07/26/2015 1026   CO2 22 07/26/2015 1026   GLUCOSE 84 07/26/2015 1026   GLUCOSE 132* 05/15/2015 1327   BUN 12 07/26/2015 1026   BUN 15 05/15/2015 1327   CREATININE 0.99 07/26/2015 1026   CREATININE 1.1 08/31/2014   CALCIUM 9.1 07/26/2015 1026   PROT 6.0 07/26/2015 1026   PROT 6.8 05/15/2015 1327   ALBUMIN 4.0 07/26/2015 1026   ALBUMIN 3.8 05/15/2015 1327   AST 27 07/26/2015 1026   ALT 15 07/26/2015 1026   ALKPHOS 74 07/26/2015 1026   BILITOT 0.8 07/26/2015 1026   BILITOT 0.7 05/15/2015 1327   GFRNONAA 76 07/26/2015 1026   GFRAA 88 07/26/2015 1026    Assessment and Plan :  1. Essential hypertension   2. HLD (hyperlipidemia)  3.Spontaneous Pneumothorax Recovered. I have done the exam and reviewed the above chart and it is accurate to the best of my knowledge.    Miguel Aschoff  MD [facility] 11/01/2015 9:03 AM

## 2015-11-21 ENCOUNTER — Other Ambulatory Visit: Payer: Self-pay | Admitting: General Surgery

## 2015-11-23 ENCOUNTER — Telehealth: Payer: Self-pay

## 2015-11-23 MED ORDER — MELOXICAM 7.5 MG PO TABS: 7.5000 mg | ORAL_TABLET | Freq: Every day | ORAL | Status: DC

## 2015-11-23 NOTE — Telephone Encounter (Signed)
Ok for 6 months 

## 2015-11-23 NOTE — Telephone Encounter (Signed)
Refill request from [facility] for Mobic 7.5 mg 1 po QD, looks like original doctor was Dr. Lucinda Dell

## 2015-11-28 ENCOUNTER — Other Ambulatory Visit: Payer: Self-pay | Admitting: Ophthalmology

## 2015-11-28 DIAGNOSIS — H53411 Scotoma involving central area, right eye: Secondary | ICD-10-CM | POA: Diagnosis not present

## 2015-11-28 DIAGNOSIS — H547 Unspecified visual loss: Secondary | ICD-10-CM

## 2015-11-28 DIAGNOSIS — H35371 Puckering of macula, right eye: Secondary | ICD-10-CM | POA: Diagnosis not present

## 2015-12-14 ENCOUNTER — Other Ambulatory Visit: Payer: Self-pay | Admitting: Ophthalmology

## 2015-12-14 ENCOUNTER — Ambulatory Visit
Admission: RE | Admit: 2015-12-14 | Discharge: 2015-12-14 | Disposition: A | Discharge status: Home / Self Care | Payer: Medicare Other | Source: Ambulatory Visit | Attending: Ophthalmology | Admitting: Ophthalmology

## 2015-12-14 ENCOUNTER — Other Ambulatory Visit
Admission: RE | Admit: 2015-12-14 | Discharge: 2015-12-14 | Disposition: A | Discharge status: Home / Self Care | Payer: Medicare Other | Source: Ambulatory Visit | Attending: Ophthalmology | Admitting: Ophthalmology

## 2015-12-14 DIAGNOSIS — H547 Unspecified visual loss: Secondary | ICD-10-CM | POA: Insufficient documentation

## 2015-12-14 DIAGNOSIS — H538 Other visual disturbances: Secondary | ICD-10-CM | POA: Diagnosis not present

## 2015-12-14 DIAGNOSIS — Z01812 Encounter for preprocedural laboratory examination: Secondary | ICD-10-CM | POA: Insufficient documentation

## 2015-12-14 DIAGNOSIS — H5461 Unqualified visual loss, right eye, normal vision left eye: Secondary | ICD-10-CM

## 2015-12-14 LAB — CREATININE, SERUM
Creatinine, Ser: 1.49 mg/dL — ABNORMAL HIGH (ref 0.61–1.24)
GFR, EST AFRICAN AMERICAN: 52 mL/min — AB (ref >60)
GFR, EST NON AFRICAN AMERICAN: 45 mL/min — AB (ref >60)

## 2015-12-14 LAB — SEDIMENTATION RATE: SED RATE: 7 mm/h (ref 0–20)

## 2015-12-14 MED ORDER — GADOBENATE DIMEGLUMINE 529 MG/ML IV SOLN
20.0000 mL | Freq: Once | INTRAVENOUS | Status: AC | PRN
  Administered 2015-12-14: 19 mL via INTRAVENOUS

## 2015-12-15 LAB — C-REACTIVE PROTEIN: CRP: 0.8 mg/dL (ref <1.0)

## 2015-12-16 ENCOUNTER — Ambulatory Visit: Payer: Medicare Other

## 2015-12-18 ENCOUNTER — Ambulatory Visit
Admission: RE | Admit: 2015-12-18 | Discharge: 2015-12-18 | Disposition: A | Discharge status: Home / Self Care | Payer: Medicare Other | Source: Ambulatory Visit | Attending: Ophthalmology | Admitting: Ophthalmology

## 2015-12-18 DIAGNOSIS — I6523 Occlusion and stenosis of bilateral carotid arteries: Secondary | ICD-10-CM | POA: Diagnosis not present

## 2015-12-18 DIAGNOSIS — H5461 Unqualified visual loss, right eye, normal vision left eye: Secondary | ICD-10-CM

## 2015-12-18 DIAGNOSIS — H547 Unspecified visual loss: Secondary | ICD-10-CM | POA: Insufficient documentation

## 2016-01-16 DIAGNOSIS — H53411 Scotoma involving central area, right eye: Secondary | ICD-10-CM | POA: Diagnosis not present

## 2016-01-24 DIAGNOSIS — Z872 Personal history of diseases of the skin and subcutaneous tissue: Secondary | ICD-10-CM | POA: Diagnosis not present

## 2016-01-24 DIAGNOSIS — Z08 Encounter for follow-up examination after completed treatment for malignant neoplasm: Secondary | ICD-10-CM | POA: Diagnosis not present

## 2016-01-24 DIAGNOSIS — Z85828 Personal history of other malignant neoplasm of skin: Secondary | ICD-10-CM | POA: Diagnosis not present

## 2016-01-24 DIAGNOSIS — B356 Tinea cruris: Secondary | ICD-10-CM | POA: Diagnosis not present

## 2016-01-24 DIAGNOSIS — Z1283 Encounter for screening for malignant neoplasm of skin: Secondary | ICD-10-CM | POA: Diagnosis not present

## 2016-01-24 DIAGNOSIS — L821 Other seborrheic keratosis: Secondary | ICD-10-CM | POA: Diagnosis not present

## 2016-02-12 ENCOUNTER — Other Ambulatory Visit: Payer: Self-pay | Admitting: Family Medicine

## 2016-02-12 NOTE — Telephone Encounter (Signed)
Dr. Gilbert's pt.   Thanks,   -Zachary [name]  

## 2016-02-27 DIAGNOSIS — H53411 Scotoma involving central area, right eye: Secondary | ICD-10-CM | POA: Diagnosis not present

## 2016-05-06 ENCOUNTER — Ambulatory Visit (INDEPENDENT_AMBULATORY_CARE_PROVIDER_SITE_OTHER): Payer: Medicare Other | Admitting: Family Medicine

## 2016-05-06 ENCOUNTER — Encounter: Payer: Self-pay | Admitting: Family Medicine

## 2016-05-06 VITALS — BP 122/58 | HR 58 | Temp 97.8°F | Resp 14 | Wt 205.0 lb

## 2016-05-06 DIAGNOSIS — E785 Hyperlipidemia, unspecified: Secondary | ICD-10-CM | POA: Diagnosis not present

## 2016-05-06 DIAGNOSIS — I10 Essential (primary) hypertension: Secondary | ICD-10-CM | POA: Diagnosis not present

## 2016-05-06 DIAGNOSIS — Z23 Encounter for immunization: Secondary | ICD-10-CM

## 2016-05-06 DIAGNOSIS — G250 Essential tremor: Secondary | ICD-10-CM | POA: Diagnosis not present

## 2016-05-06 NOTE — Progress Notes (Signed)
Subjective:  HPI  Hypertension, follow-up:  BP Readings from Last 3 Encounters:  05/06/16 (!) 122/58  11/01/15 108/64  07/26/15 122/78    He was last seen for hypertension 6 months ago.  BP at that visit was 108/64. Management since that visit includes none. He reports good compliance with treatment. He is not having side effects.  He is exercising. He is adherent to low salt diet.   Outside blood pressures are not being checked. He is experiencing none.  Patient denies chest pain, chest pressure/discomfort, claudication, dyspnea, exertional chest pressure/discomfort, fatigue, irregular heart beat, lower extremity edema, near-syncope, orthopnea, palpitations, paroxysmal nocturnal dyspnea and syncope.   Cardiovascular risk factors include advanced age (older than 29 for men, 51 for women), dyslipidemia, hypertension and male gender.  Wt Readings from Last 3 Encounters:  05/06/16 205 lb (93 kg)  11/01/15 201 lb (91.2 kg)  07/26/15 202 lb (91.6 kg)   ------------------------------------------------------------------------    Prior to Admission medications   Medication Sig Start Date End Date Taking? Authorizing Provider  aspirin EC 81 MG tablet Take 81 mg by mouth at bedtime.    Historical Provider, MD  atorvastatin (LIPITOR) 20 MG tablet TAKE ONE TABLET BY MOUTH AT BEDTIME 02/12/16   Birdie Sons, MD  lisinopril (PRINIVIL,ZESTRIL) 40 MG tablet Take 1 tablet (40 mg total) by mouth daily. 05/24/15   Leora Platt Maceo Pro., MD  meloxicam (MOBIC) 7.5 MG tablet Take 1 tablet (7.5 mg total) by mouth daily. 11/23/15   Jerrol Banana., MD    Patient Active Problem List   Diagnosis Date Noted  . Pneumothorax on right 05/15/2015  . Benign essential tremor 02/28/2015  . HLD (hyperlipidemia) 02/28/2015  . BP (high blood pressure) 02/28/2015  . Hypercholesterolemia without hypertriglyceridemia 02/28/2015  . Head revolving around 02/28/2015    Past Medical History:    Diagnosis Date  . Hypercholesterolemia   . Hypertension     Social History   Social History  . Marital status: Married    Spouse name: N/A  . Number of children: N/A  . Years of education: N/A   Occupational History  . Not on file.   Social History Main Topics  . Smoking status: Never Smoker  . Smokeless tobacco: Never Used  . Alcohol use No  . Drug use: No  . Sexual activity: Not on file   Other Topics Concern  . Not on file   Social History Narrative  . No narrative on file    No Known Allergies  Review of Systems  Constitutional: Negative.   HENT: Negative.   Eyes: Negative.   Respiratory: Negative.   Cardiovascular: Negative.   Gastrointestinal: Negative.   Genitourinary: Negative.   Musculoskeletal: Negative.   Skin: Negative.   Neurological: Positive for tremors.  Endo/Heme/Allergies: Negative.   Psychiatric/Behavioral: Negative.     Immunization History  Administered Date(s) Administered  . Influenza, High Dose Seasonal PF 03/01/2015  . Pneumococcal Conjugate-13 06/09/2014  . Pneumococcal Polysaccharide-23 04/07/1996, 07/26/2015  . Td 02/10/2002  . Tdap 06/09/2014  . Zoster 06/30/2013    Objective:  BP (!) 122/58 (BP Location: Left Arm, Patient Position: Sitting, Cuff Size: Normal)   Pulse (!) 58   Temp 97.8 F (36.6 C) (Oral)   Resp 14   Wt 205 lb (93 kg)   BMI 25.97 kg/m   Physical Exam  Constitutional: He is oriented to person, place, and time and well-developed, well-nourished, and in no distress.  Eyes: Conjunctivae and  EOM are normal. Pupils are equal, round, and reactive to light.  Neck: Normal range of motion. Neck supple.  Cardiovascular: Normal rate, regular rhythm, normal heart sounds and intact distal pulses.   Pulmonary/Chest: Effort normal and breath sounds normal.  Musculoskeletal: Normal range of motion.  Neurological: He is alert and oriented to person, place, and time. He has normal reflexes. Gait normal. GCS score is  15.  Benign essential tremor of right hand, this appears to be an intention tremor as opposed to a resting tremor. He does have a little bit of movement and is lower lip little bit of decrease blinking that is noticed today  Skin: Skin is warm and dry.  Psychiatric: Mood, memory, affect and judgment normal.    Lab Results  Component Value Date   WBC 4.4 07/26/2015   HGB 14.1 05/15/2015   HCT 40.1 07/26/2015   PLT 214 07/26/2015   GLUCOSE 84 07/26/2015   CHOL 182 07/26/2015   TRIG 68 07/26/2015   HDL 70 07/26/2015   LDLCALC 98 07/26/2015   TSH 3.730 07/26/2015   PSA 1.2 06/09/2014   HGBA1C 5.2 05/19/2015    CMP     Component Value Date/Time   NA 142 07/26/2015 1026   K 4.8 07/26/2015 1026   CL 106 07/26/2015 1026   CO2 22 07/26/2015 1026   GLUCOSE 84 07/26/2015 1026   GLUCOSE 132 (H) 05/15/2015 1327   BUN 12 07/26/2015 1026   CREATININE 1.49 (H) 12/14/2015 1501   CALCIUM 9.1 07/26/2015 1026   PROT 6.0 07/26/2015 1026   ALBUMIN 4.0 07/26/2015 1026   AST 27 07/26/2015 1026   ALT 15 07/26/2015 1026   ALKPHOS 74 07/26/2015 1026   BILITOT 0.8 07/26/2015 1026   GFRNONAA 45 (L) 12/14/2015 1501   GFRAA 52 (L) 12/14/2015 1501    Assessment and Plan :  1. Essential hypertension Stable Follow up in 6 months. - CBC with Differential/Platelet - TSH  2. Hyperlipidemia, unspecified hyperlipidemia type  - Lipid Panel With LDL/HDL Ratio - Comprehensive metabolic panel  3. Need for influenza vaccination  - Flu vaccine HIGH DOSE PF  4. Benign essential tremor BEST is most likely but it is possible this is early Parkinson's disease. There is no real good treatment for PD at this time we'll just follow for now. He will let us know if this tremor bothers him more. He declines treatment of the PET which I would start with Inderal.   HPI, Exam, and A&P Transcribed under the direction and in the presence of Refugio Vandevoorde L. Cranford Mon, MD  Electronically Signed: Webb Laws, CMA I  have done the exam and reviewed the above chart and it is accurate to the best of my knowledge. Development worker, community has been used in this note in any air is in the dictation or transcription are unintentional.  [facility] 05/06/2016 8:48 AM

## 2016-05-07 ENCOUNTER — Telehealth: Payer: Self-pay | Admitting: Family Medicine

## 2016-05-07 LAB — CBC WITH DIFFERENTIAL/PLATELET
Basophils Absolute: 0 10*3/uL (ref 0.0–0.2)
Basos: 1 %
EOS (ABSOLUTE): 0.2 10*3/uL (ref 0.0–0.4)
Eos: 3 %
HEMATOCRIT: 41.5 % (ref 37.5–51.0)
Hemoglobin: 13.8 g/dL (ref 13.0–17.7)
IMMATURE GRANULOCYTES: 0 %
Immature Grans (Abs): 0 10*3/uL (ref 0.0–0.1)
LYMPHS ABS: 1.1 10*3/uL (ref 0.7–3.1)
Lymphs: 20 %
MCH: 32.2 pg (ref 26.6–33.0)
MCHC: 33.3 g/dL (ref 31.5–35.7)
MCV: 97 fL (ref 79–97)
MONOS ABS: 0.4 10*3/uL (ref 0.1–0.9)
Monocytes: 9 %
NEUTROS PCT: 67 %
Neutrophils Absolute: 3.5 10*3/uL (ref 1.4–7.0)
PLATELETS: 233 10*3/uL (ref 150–379)
RBC: 4.29 x10E6/uL (ref 4.14–5.80)
RDW: 13.3 % (ref 12.3–15.4)
WBC: 5.2 10*3/uL (ref 3.4–10.8)

## 2016-05-07 LAB — COMPREHENSIVE METABOLIC PANEL
A/G RATIO: 2 (ref 1.2–2.2)
ALT: 18 IU/L (ref 0–44)
AST: 24 IU/L (ref 0–40)
Albumin: 4.3 g/dL (ref 3.5–4.8)
Alkaline Phosphatase: 73 IU/L (ref 39–117)
BUN/Creatinine Ratio: 12 (ref 10–24)
BUN: 14 mg/dL (ref 8–27)
Bilirubin Total: 0.8 mg/dL (ref 0.0–1.2)
CALCIUM: 9.1 mg/dL (ref 8.6–10.2)
CO2: 24 mmol/L (ref 18–29)
CREATININE: 1.19 mg/dL (ref 0.76–1.27)
Chloride: 105 mmol/L (ref 96–106)
GFR, EST AFRICAN AMERICAN: 70 mL/min/{1.73_m2} (ref >59)
GFR, EST NON AFRICAN AMERICAN: 60 mL/min/{1.73_m2} (ref >59)
Globulin, Total: 2.1 g/dL (ref 1.5–4.5)
Glucose: 93 mg/dL (ref 65–99)
Potassium: 4.6 mmol/L (ref 3.5–5.2)
Sodium: 141 mmol/L (ref 134–144)
TOTAL PROTEIN: 6.4 g/dL (ref 6.0–8.5)

## 2016-05-07 LAB — TSH: TSH: 5.74 u[IU]/mL — AB (ref 0.450–4.500)

## 2016-05-07 LAB — LIPID PANEL WITH LDL/HDL RATIO
Cholesterol, Total: 189 mg/dL (ref 100–199)
HDL: 70 mg/dL (ref >39)
LDL Calculated: 105 mg/dL — ABNORMAL HIGH (ref 0–99)
LDL/HDL RATIO: 1.5 ratio (ref 0.0–3.6)
TRIGLYCERIDES: 72 mg/dL (ref 0–149)
VLDL Cholesterol Cal: 14 mg/dL (ref 5–40)

## 2016-05-07 NOTE — Telephone Encounter (Signed)
Pt returned Zachary [name]'s call about his lab results. Thanks TNP

## 2016-05-13 ENCOUNTER — Other Ambulatory Visit: Payer: Self-pay | Admitting: Family Medicine

## 2016-05-22 NOTE — Telephone Encounter (Signed)
MEDICARE PT Called Pt to schedule AWV with [facility] & CPE w/ PCP- knb

## 2016-06-12 DIAGNOSIS — H59811 Chorioretinal scars after surgery for detachment, right eye: Secondary | ICD-10-CM | POA: Diagnosis not present

## 2016-08-01 ENCOUNTER — Ambulatory Visit (INDEPENDENT_AMBULATORY_CARE_PROVIDER_SITE_OTHER): Payer: Medicare Other | Admitting: Family Medicine

## 2016-08-01 ENCOUNTER — Encounter: Payer: Self-pay | Admitting: Family Medicine

## 2016-08-01 VITALS — BP 112/84 | HR 58 | Temp 98.6°F | Resp 16 | Wt 207.2 lb

## 2016-08-01 DIAGNOSIS — J029 Acute pharyngitis, unspecified: Secondary | ICD-10-CM | POA: Diagnosis not present

## 2016-08-01 NOTE — Patient Instructions (Addendum)
Discussed use of ibuprofen and salt water gargles. For sinus congestion Mucinex D and Delsym for cough.

## 2016-08-01 NOTE — Progress Notes (Signed)
Subjective:     Patient ID: Zachary [name], male   DOB: 01-25-1941, 76 y.o.   MRN: [id number]  HPI  Chief Complaint  Patient presents with  . Sore Throat    Patient comes in office today with concerns of sore throat. Patient states upon awkaening this morning he noticed symptom, he repoprts it burns when he drinks fluids. Patient has no other associated cold/flu like symptoms, he states that he has taken otc Nyquil today.   + flu shot   Review of Systems     Objective:   Physical Exam  Constitutional: He appears well-developed and well-nourished. No distress.  Ears: T.M's intact without inflammation Throat: no tonsillar enlargement or exudate; mild posterior pharyngeal erythema Neck: no cervical adenopathy Lungs: clear     Assessment:    1. Pharyngitis, unspecified etiology: suspect early URI     Plan:    discussed symptomatic treatment. Phone f/u in one week if not improving or sooner if high fever.

## 2016-08-27 ENCOUNTER — Telehealth: Payer: Self-pay | Admitting: Family Medicine

## 2016-08-27 NOTE — Telephone Encounter (Signed)
Called Pt to schedule AWV with [facility] - knb °

## 2016-11-12 ENCOUNTER — Encounter: Payer: Self-pay | Admitting: Family Medicine

## 2016-11-12 ENCOUNTER — Ambulatory Visit (INDEPENDENT_AMBULATORY_CARE_PROVIDER_SITE_OTHER): Payer: Medicare Other

## 2016-11-12 ENCOUNTER — Ambulatory Visit (INDEPENDENT_AMBULATORY_CARE_PROVIDER_SITE_OTHER): Payer: Medicare Other | Admitting: Family Medicine

## 2016-11-12 VITALS — BP 108/62 | HR 52 | Temp 97.6°F | Resp 16 | Wt 201.0 lb

## 2016-11-12 VITALS — BP 108/62 | HR 52 | Temp 97.6°F | Ht 75.0 in | Wt 201.0 lb

## 2016-11-12 DIAGNOSIS — E785 Hyperlipidemia, unspecified: Secondary | ICD-10-CM

## 2016-11-12 DIAGNOSIS — I10 Essential (primary) hypertension: Secondary | ICD-10-CM | POA: Diagnosis not present

## 2016-11-12 DIAGNOSIS — Z0000 Encounter for general adult medical examination without abnormal findings: Secondary | ICD-10-CM

## 2016-11-12 NOTE — Patient Instructions (Signed)
Mr. Zachary [name] , Thank you for taking time to come for your Medicare Wellness Visit. I appreciate your ongoing commitment to your health goals. Please review the following plan we discussed and let me know if I can assist you in the future.   Screening recommendations/referrals: Colonoscopy: completed 03/11/11, due 03/2021 Recommended yearly ophthalmology/optometry visit for glaucoma screening and checkup Recommended yearly dental visit for hygiene and checkup  Vaccinations: Influenza vaccine: up to date, due 02/2017 Pneumococcal vaccine: completed series Tdap vaccine: completed 06/09/14, due 06/2024 Shingles vaccine: completed 06/30/13  Advanced directives: Advance directive discussed with you today. Even though you declined this today please call our office should you change your mind and we can give you the proper paperwork for you to fill out.  Conditions/risks identified: Recommend to continue drinking 6-8 glasses a day.   Next appointment: None, need to schedule 1 year AWV.  Preventive Care 37 Years and Older, Male Preventive care refers to lifestyle choices and visits with your health care provider that can promote health and wellness. What does preventive care include?  A yearly physical exam. This is also called an annual well check.  Dental exams once or twice a year.  Routine eye exams. Ask your health care provider how often you should have your eyes checked.  Personal lifestyle choices, including:  Daily care of your teeth and gums.  Regular physical activity.  Eating a healthy diet.  Avoiding tobacco and drug use.  Limiting alcohol use.  Practicing safe sex.  Taking low doses of aspirin every day.  Taking vitamin and mineral supplements as recommended by your health care provider. What happens during an annual well check? The services and screenings done by your health care provider during your annual well check will depend on your age, overall health,  lifestyle risk factors, and family history of disease. Counseling  Your health care provider may ask you questions about your:  Alcohol use.  Tobacco use.  Drug use.  Emotional well-being.  Home and relationship well-being.  Sexual activity.  Eating habits.  History of falls.  Memory and ability to understand (cognition).  Work and work Statistician. Screening  You may have the following tests or measurements:  Height, weight, and BMI.  Blood pressure.  Lipid and cholesterol levels. These may be checked every 5 years, or more frequently if you are over 58 years old.  Skin check.  Lung cancer screening. You may have this screening every year starting at age 22 if you have a 30-pack-year history of smoking and currently smoke or have quit within the past 15 years.  Fecal occult blood test (FOBT) of the stool. You may have this test every year starting at age 11.  Flexible sigmoidoscopy or colonoscopy. You may have a sigmoidoscopy every 5 years or a colonoscopy every 10 years starting at age 45.  Prostate cancer screening. Recommendations will vary depending on your family history and other risks.  Hepatitis C blood test.  Hepatitis B blood test.  Sexually transmitted disease (STD) testing.  Diabetes screening. This is done by checking your blood sugar (glucose) after you have not eaten for a while (fasting). You may have this done every 1-3 years.  Abdominal aortic aneurysm (AAA) screening. You may need this if you are a current or former smoker.  Osteoporosis. You may be screened starting at age 41 if you are at high risk. Talk with your health care provider about your test results, treatment options, and if necessary, the need for more  tests. Vaccines  Your health care provider may recommend certain vaccines, such as:  Influenza vaccine. This is recommended every year.  Tetanus, diphtheria, and acellular pertussis (Tdap, Td) vaccine. You may need a Td booster  every 10 years.  Zoster vaccine. You may need this after age 18.  Pneumococcal 13-valent conjugate (PCV13) vaccine. One dose is recommended after age 1.  Pneumococcal polysaccharide (PPSV23) vaccine. One dose is recommended after age 14. Talk to your health care provider about which screenings and vaccines you need and how often you need them. This information is not intended to replace advice given to you by your health care provider. Make sure you discuss any questions you have with your health care provider. Document Released: 06/16/2015 Document Revised: 02/07/2016 Document Reviewed: 03/21/2015 Elsevier Interactive Patient Education  2017 [place] Prevention in the Home Falls can cause injuries. They can happen to people of all ages. There are many things you can do to make your home safe and to help prevent falls. What can I do on the outside of my home?  Regularly fix the edges of walkways and driveways and fix any cracks.  Remove anything that might make you trip as you walk through a door, such as a raised step or threshold.  Trim any bushes or trees on the path to your home.  Use bright outdoor lighting.  Clear any walking paths of anything that might make someone trip, such as rocks or tools.  Regularly check to see if handrails are loose or broken. Make sure that both sides of any steps have handrails.  Any raised decks and porches should have guardrails on the edges.  Have any leaves, snow, or ice cleared regularly.  Use sand or salt on walking paths during winter.  Clean up any spills in your garage right away. This includes oil or grease spills. What can I do in the bathroom?  Use night lights.  Install grab bars by the toilet and in the tub and shower. Do not use towel bars as grab bars.  Use non-skid mats or decals in the tub or shower.  If you need to sit down in the shower, use a plastic, non-slip stool.  Keep the floor dry. Clean up any  water that spills on the floor as soon as it happens.  Remove soap buildup in the tub or shower regularly.  Attach bath mats securely with double-sided non-slip rug tape.  Do not have throw rugs and other things on the floor that can make you trip. What can I do in the bedroom?  Use night lights.  Make sure that you have a light by your bed that is easy to reach.  Do not use any sheets or blankets that are too big for your bed. They should not hang down onto the floor.  Have a firm chair that has side arms. You can use this for support while you get dressed.  Do not have throw rugs and other things on the floor that can make you trip. What can I do in the kitchen?  Clean up any spills right away.  Avoid walking on wet floors.  Keep items that you use a lot in easy-to-reach places.  If you need to reach something above you, use a strong step stool that has a grab bar.  Keep electrical cords out of the way.  Do not use floor polish or wax that makes floors slippery. If you must use wax, use non-skid floor  wax.  Do not have throw rugs and other things on the floor that can make you trip. What can I do with my stairs?  Do not leave any items on the stairs.  Make sure that there are handrails on both sides of the stairs and use them. Fix handrails that are broken or loose. Make sure that handrails are as long as the stairways.  Check any carpeting to make sure that it is firmly attached to the stairs. Fix any carpet that is loose or worn.  Avoid having throw rugs at the top or bottom of the stairs. If you do have throw rugs, attach them to the floor with carpet tape.  Make sure that you have a light switch at the top of the stairs and the bottom of the stairs. If you do not have them, ask someone to add them for you. What else can I do to help prevent falls?  Wear shoes that:  Do not have high heels.  Have rubber bottoms.  Are comfortable and fit you well.  Are closed  at the toe. Do not wear sandals.  If you use a stepladder:  Make sure that it is fully opened. Do not climb a closed stepladder.  Make sure that both sides of the stepladder are locked into place.  Ask someone to hold it for you, if possible.  Clearly mark and make sure that you can see:  Any grab bars or handrails.  First and last steps.  Where the edge of each step is.  Use tools that help you move around (mobility aids) if they are needed. These include:  Canes.  Walkers.  Scooters.  Crutches.  Turn on the lights when you go into a dark area. Replace any light bulbs as soon as they burn out.  Set up your furniture so you have a clear path. Avoid moving your furniture around.  If any of your floors are uneven, fix them.  If there are any pets around you, be aware of where they are.  Review your medicines with your doctor. Some medicines can make you feel dizzy. This can increase your chance of falling. Ask your doctor what other things that you can do to help prevent falls. This information is not intended to replace advice given to you by your health care provider. Make sure you discuss any questions you have with your health care provider. Document Released: 03/16/2009 Document Revised: 10/26/2015 Document Reviewed: 06/24/2014 Elsevier Interactive Patient Education  2017 [employer].

## 2016-11-12 NOTE — Progress Notes (Signed)
Subjective:  HPI  Hypertension, follow-up:  BP Readings from Last 3 Encounters:  11/12/16 108/62  08/01/16 112/84  05/06/16 (!) 122/58    He was last seen for hypertension 6 months ago.  BP at that visit was 112/84. Management since that visit includes none. He reports good compliance with treatment. He is not having side effects.  He is exercising. He is adherent to low salt diet.   Outside blood pressures are not being checked. He is experiencing none.  Patient denies chest pain, chest pressure/discomfort, claudication, dyspnea, exertional chest pressure/discomfort, fatigue, irregular heart beat, lower extremity edema, near-syncope, orthopnea, palpitations, paroxysmal nocturnal dyspnea, syncope and tachypnea.   Cardiovascular risk factors include advanced age (older than 49 for men, 63 for women), dyslipidemia, hypertension and male gender.   Wt Readings from Last 3 Encounters:  11/12/16 201 lb (91.2 kg)  08/01/16 207 lb 3.2 oz (94 kg)  05/06/16 205 lb (93 kg)   ------------------------------------------------------------------------    Prior to Admission medications   Medication Sig Start Date End Date Taking? Authorizing Provider  aspirin EC 81 MG tablet Take 81 mg by mouth at bedtime.    [provider]  atorvastatin (LIPITOR) 20 MG tablet TAKE ONE TABLET BY MOUTH AT BEDTIME 02/12/16   Birdie Sons, MD  lisinopril (PRINIVIL,ZESTRIL) 40 MG tablet TAKE ONE TABLET BY MOUTH ONCE DAILY 05/13/16   Jerrol Banana., MD    Patient Active Problem List   Diagnosis Date Noted  . Pneumothorax on right 05/15/2015  . Benign essential tremor 02/28/2015  . HLD (hyperlipidemia) 02/28/2015  . BP (high blood pressure) 02/28/2015  . Hypercholesterolemia without hypertriglyceridemia 02/28/2015  . Head revolving around 02/28/2015    Past Medical History:  Diagnosis Date  . Hypercholesterolemia   . Hypertension     Social History   Social History  .  Marital status: Married    Spouse name: N/A  . Number of children: N/A  . Years of education: N/A   Occupational History  . Not on file.   Social History Main Topics  . Smoking status: Never Smoker  . Smokeless tobacco: Never Used  . Alcohol use No  . Drug use: No  . Sexual activity: Not on file   Other Topics Concern  . Not on file   Social History Narrative  . No narrative on file    No Known Allergies  Review of Systems  Constitutional: Negative.   HENT: Negative.   Eyes: Negative.   Respiratory: Negative.   Cardiovascular: Negative.   Gastrointestinal: Negative.   Genitourinary: Negative.   Musculoskeletal: Negative.   Skin: Negative.   Neurological: Negative.   Endo/Heme/Allergies: Negative.   Psychiatric/Behavioral: Negative.     Immunization History  Administered Date(s) Administered  . Influenza, High Dose Seasonal PF 03/01/2015, 05/06/2016  . Pneumococcal Conjugate-13 06/09/2014  . Pneumococcal Polysaccharide-23 04/07/1996, 07/26/2015  . Td 02/10/2002  . Tdap 06/09/2014  . Zoster 06/30/2013    Objective:  BP 108/62 (BP Location: Left Arm, Patient Position: Sitting, Cuff Size: Normal)   Pulse (!) 52   Temp 97.6 F (36.4 C) (Oral)   Resp 16   Wt 201 lb (91.2 kg)   BMI 25.46 kg/m   Physical Exam  Constitutional: He is oriented to person, place, and time and well-developed, well-nourished, and in no distress.  HENT:  Head: Normocephalic and atraumatic.  Right Ear: External ear normal.  Left Ear: External ear normal.  Nose: Nose normal.  Eyes: Conjunctivae are  normal.  Cardiovascular: Normal rate, regular rhythm and normal heart sounds.   Pulmonary/Chest: Effort normal and breath sounds normal.  Abdominal: Soft.  Neurological: He is alert and oriented to person, place, and time.  Skin: Skin is warm and dry.  Psychiatric: Mood, memory, affect and judgment normal.    Lab Results  Component Value Date   WBC 5.2 05/06/2016   HGB 13.8  05/06/2016   HCT 41.5 05/06/2016   PLT 233 05/06/2016   GLUCOSE 93 05/06/2016   CHOL 189 05/06/2016   TRIG 72 05/06/2016   HDL 70 05/06/2016   LDLCALC 105 (H) 05/06/2016   TSH 5.740 (H) 05/06/2016   PSA 1.2 06/09/2014   HGBA1C 5.2 05/19/2015    CMP     Component Value Date/Time   NA 141 05/06/2016 0921   K 4.6 05/06/2016 0921   CL 105 05/06/2016 0921   CO2 24 05/06/2016 0921   GLUCOSE 93 05/06/2016 0921   GLUCOSE 132 (H) 05/15/2015 1327   BUN 14 05/06/2016 0921   CREATININE 1.19 05/06/2016 0921   CALCIUM 9.1 05/06/2016 0921   PROT 6.4 05/06/2016 0921   ALBUMIN 4.3 05/06/2016 0921   AST 24 05/06/2016 0921   ALT 18 05/06/2016 0921   ALKPHOS 73 05/06/2016 0921   BILITOT 0.8 05/06/2016 0921   GFRNONAA 60 05/06/2016 0921   GFRAA 70 05/06/2016 0921    Assessment and Plan :  1. Essential hypertension  - CBC with Differential/Platelet - TSH  2. Hyperlipidemia, unspecified hyperlipidemia type  - Comprehensive metabolic panel  I have done the exam and reviewed the above chart and it is accurate to the best of my knowledge. Development worker, community has been used in this note in any air is in the dictation or transcription are unintentional.  [facility] 11/12/2016 8:17 AM

## 2016-11-12 NOTE — Progress Notes (Signed)
Subjective:   Zachary [name] is a 76 y.o. male who presents for Medicare Annual/Subsequent preventive examination.  Review of Systems:  N/A  Cardiac Risk Factors include: advanced age (>47men, >77 women);dyslipidemia;hypertension;male gender     Objective:    Vitals: BP 108/62 (BP Location: Right Arm)   Pulse (!) 52   Temp 97.6 F (36.4 C) (Oral)   Ht 6\' 3"  (1.905 m)   Wt 201 lb (91.2 kg)   BMI 25.12 kg/m   Body mass index is 25.12 kg/m.  Tobacco History  Smoking Status  . Never Smoker  Smokeless Tobacco  . Never Used     Counseling given: Not Answered   Past Medical History:  Diagnosis Date  . Hypercholesterolemia   . Hypertension    Past Surgical History:  Procedure Laterality Date  . APPENDECTOMY    . CATARACT EXTRACTION Bilateral   . PLEURAL SCARIFICATION  05/2015   Family History  Problem Relation Age of Onset  . Hypertension Mother   . Hyperlipidemia Mother   . Heart attack Father   . Hypertension Father   . CVA Father   . ALS Brother   . Prostate cancer Brother    History  Sexual Activity  . Sexual activity: Not on file    Outpatient Encounter Prescriptions as of 11/12/2016  Medication Sig  . aspirin EC 81 MG tablet Take 81 mg by mouth at bedtime.  Marland Kitchen atorvastatin (LIPITOR) 20 MG tablet TAKE ONE TABLET BY MOUTH AT BEDTIME  . lisinopril (PRINIVIL,ZESTRIL) 40 MG tablet TAKE ONE TABLET BY MOUTH ONCE DAILY   No facility-administered encounter medications on file as of 11/12/2016.     Activities of Daily Living In your present state of health, do you have any difficulty performing the following activities: 11/12/2016  Hearing? N  Vision? N  Difficulty concentrating or making decisions? N  Walking or climbing stairs? N  Dressing or bathing? N  Doing errands, shopping? N  Preparing Food and eating ? N  Using the Toilet? N  In the past six months, have you accidently leaked urine? N  Do you have problems with loss of bowel control? N    Managing your Medications? N  Managing your Finances? N  Housekeeping or managing your Housekeeping? N  Some recent data might be hidden    Patient Care Team: Jerrol Banana., MD as PCP - General (Family Medicine) Christene Lye, MD (General Surgery) Jerrol Banana., MD (Family Medicine)   Assessment:     Exercise Activities and Dietary recommendations Current Exercise Habits: The patient does not participate in regular exercise at present (yardwork- owns 2 acres), Exercise limited by: None identified  Goals    . Increase water intake          Continue drinking 6-8 glasses of water a day.      Fall Risk Fall Risk  11/12/2016 07/26/2015 05/15/2015  Falls in the past year? No No No   Depression Screen PHQ 2/9 Scores 11/12/2016 11/12/2016 07/26/2015 05/15/2015  PHQ - 2 Score 0 0 0 0  PHQ- 9 Score 0 0 - -    Cognitive Function        Immunization History  Administered Date(s) Administered  . Influenza, High Dose Seasonal PF 03/01/2015, 05/06/2016  . Pneumococcal Conjugate-13 06/09/2014  . Pneumococcal Polysaccharide-23 04/07/1996, 07/26/2015  . Td 02/10/2002  . Tdap 06/09/2014  . Zoster 06/30/2013   Screening Tests Health Maintenance  Topic Date Due  . INFLUENZA  VACCINE  01/01/2017  . COLONOSCOPY  03/10/2021  . TETANUS/TDAP  06/09/2024  . PNA vac Low Risk Adult  Completed      Plan:  I have personally reviewed and addressed the Medicare Annual Wellness questionnaire and have noted the following in the patient's chart:  A. Medical and social history B. Use of alcohol, tobacco or illicit drugs  C. Current medications and supplements D. Functional ability and status E.  Nutritional status F.  Physical activity G. Advance directives H. List of other physicians I.  Hospitalizations, surgeries, and ER visits in previous 12 months J.  Cyril such as hearing and vision if needed, cognitive and depression L. Referrals and  appointments - none  In addition, I have reviewed and discussed with patient certain preventive protocols, quality metrics, and best practice recommendations. A written personalized care plan for preventive services as well as general preventive health recommendations were provided to patient.  See attached scanned questionnaire for additional information.   Signed,  Fabio Neighbors, LPN Nurse Health Advisor   MD Recommendations: None.

## 2016-11-13 LAB — CBC WITH DIFFERENTIAL/PLATELET
Basophils Absolute: 0 10*3/uL (ref 0.0–0.2)
Basos: 0 %
EOS (ABSOLUTE): 0.2 10*3/uL (ref 0.0–0.4)
Eos: 4 %
HEMATOCRIT: 40.5 % (ref 37.5–51.0)
Hemoglobin: 13.4 g/dL (ref 13.0–17.7)
Immature Grans (Abs): 0 10*3/uL (ref 0.0–0.1)
Immature Granulocytes: 1 %
LYMPHS ABS: 0.9 10*3/uL (ref 0.7–3.1)
Lymphs: 20 %
MCH: 32.1 pg (ref 26.6–33.0)
MCHC: 33.1 g/dL (ref 31.5–35.7)
MCV: 97 fL (ref 79–97)
MONOS ABS: 0.3 10*3/uL (ref 0.1–0.9)
Monocytes: 7 %
Neutrophils Absolute: 3.2 10*3/uL (ref 1.4–7.0)
Neutrophils: 68 %
Platelets: 229 10*3/uL (ref 150–379)
RBC: 4.18 x10E6/uL (ref 4.14–5.80)
RDW: 13.5 % (ref 12.3–15.4)
WBC: 4.6 10*3/uL (ref 3.4–10.8)

## 2016-11-13 LAB — COMPREHENSIVE METABOLIC PANEL
ALK PHOS: 67 IU/L (ref 39–117)
ALT: 12 IU/L (ref 0–44)
AST: 23 IU/L (ref 0–40)
Albumin/Globulin Ratio: 1.8 (ref 1.2–2.2)
Albumin: 4.2 g/dL (ref 3.5–4.8)
BILIRUBIN TOTAL: 0.8 mg/dL (ref 0.0–1.2)
BUN/Creatinine Ratio: 17 (ref 10–24)
BUN: 18 mg/dL (ref 8–27)
CHLORIDE: 106 mmol/L (ref 96–106)
CO2: 23 mmol/L (ref 20–29)
CREATININE: 1.06 mg/dL (ref 0.76–1.27)
Calcium: 9 mg/dL (ref 8.6–10.2)
GFR calc Af Amer: 80 mL/min/{1.73_m2} (ref >59)
GFR calc non Af Amer: 69 mL/min/{1.73_m2} (ref >59)
GLOBULIN, TOTAL: 2.3 g/dL (ref 1.5–4.5)
Glucose: 83 mg/dL (ref 65–99)
POTASSIUM: 4.5 mmol/L (ref 3.5–5.2)
SODIUM: 141 mmol/L (ref 134–144)
Total Protein: 6.5 g/dL (ref 6.0–8.5)

## 2016-11-13 LAB — TSH: TSH: 4.7 u[IU]/mL — AB (ref 0.450–4.500)

## 2016-12-09 DIAGNOSIS — Z961 Presence of intraocular lens: Secondary | ICD-10-CM | POA: Diagnosis not present

## 2016-12-30 ENCOUNTER — Other Ambulatory Visit: Payer: Self-pay

## 2017-01-20 ENCOUNTER — Emergency Department: Payer: Medicare Other

## 2017-01-20 ENCOUNTER — Ambulatory Visit (INDEPENDENT_AMBULATORY_CARE_PROVIDER_SITE_OTHER)
Admission: EM | Admit: 2017-01-20 | Discharge: 2017-01-20 | Disposition: A | Discharge status: Other Acute Inpatient Hospital | Payer: Medicare Other | Source: Home / Self Care | Attending: Family Medicine | Admitting: Family Medicine

## 2017-01-20 ENCOUNTER — Encounter: Payer: Self-pay | Admitting: Emergency Medicine

## 2017-01-20 ENCOUNTER — Emergency Department
Admission: EM | Admit: 2017-01-20 | Discharge: 2017-01-20 | Disposition: A | Discharge status: Home / Self Care | Payer: Medicare Other | Attending: Student in an Organized Health Care Education/Training Program | Admitting: Student in an Organized Health Care Education/Training Program

## 2017-01-20 DIAGNOSIS — R531 Weakness: Secondary | ICD-10-CM | POA: Diagnosis not present

## 2017-01-20 DIAGNOSIS — Z7982 Long term (current) use of aspirin: Secondary | ICD-10-CM | POA: Insufficient documentation

## 2017-01-20 DIAGNOSIS — I10 Essential (primary) hypertension: Secondary | ICD-10-CM

## 2017-01-20 DIAGNOSIS — R001 Bradycardia, unspecified: Secondary | ICD-10-CM

## 2017-01-20 DIAGNOSIS — Z79899 Other long term (current) drug therapy: Secondary | ICD-10-CM | POA: Diagnosis not present

## 2017-01-20 DIAGNOSIS — R9431 Abnormal electrocardiogram [ECG] [EKG]: Secondary | ICD-10-CM | POA: Diagnosis not present

## 2017-01-20 DIAGNOSIS — Z9889 Other specified postprocedural states: Secondary | ICD-10-CM

## 2017-01-20 DIAGNOSIS — R42 Dizziness and giddiness: Secondary | ICD-10-CM | POA: Diagnosis not present

## 2017-01-20 DIAGNOSIS — H8149 Vertigo of central origin, unspecified ear: Secondary | ICD-10-CM

## 2017-01-20 DIAGNOSIS — E7800 Pure hypercholesterolemia, unspecified: Secondary | ICD-10-CM

## 2017-01-20 LAB — CBC
HCT: 40.9 % (ref 40.0–52.0)
HEMOGLOBIN: 14.2 g/dL (ref 13.0–18.0)
MCH: 33.6 pg (ref 26.0–34.0)
MCHC: 34.6 g/dL (ref 32.0–36.0)
MCV: 97.2 fL (ref 80.0–100.0)
Platelets: 199 10*3/uL (ref 150–440)
RBC: 4.21 MIL/uL — AB (ref 4.40–5.90)
RDW: 13.5 % (ref 11.5–14.5)
WBC: 8.2 10*3/uL (ref 3.8–10.6)

## 2017-01-20 LAB — BASIC METABOLIC PANEL
ANION GAP: 8 (ref 5–15)
BUN: 22 mg/dL — ABNORMAL HIGH (ref 6–20)
CALCIUM: 9 mg/dL (ref 8.9–10.3)
CO2: 23 mmol/L (ref 22–32)
Chloride: 109 mmol/L (ref 101–111)
Creatinine, Ser: 1.33 mg/dL — ABNORMAL HIGH (ref 0.61–1.24)
GFR, EST AFRICAN AMERICAN: 60 mL/min — AB (ref >60)
GFR, EST NON AFRICAN AMERICAN: 51 mL/min — AB (ref >60)
Glucose, Bld: 95 mg/dL (ref 65–99)
Potassium: 3.9 mmol/L (ref 3.5–5.1)
Sodium: 140 mmol/L (ref 135–145)

## 2017-01-20 LAB — GLUCOSE, CAPILLARY: Glucose-Capillary: 80 mg/dL (ref 65–99)

## 2017-01-20 LAB — TROPONIN I

## 2017-01-20 MED ORDER — ONDANSETRON 4 MG PO TBDP
4.0000 mg | ORAL_TABLET | Freq: Once | ORAL | Status: AC
  Administered 2017-01-20: 4 mg via ORAL

## 2017-01-20 MED ORDER — SODIUM CHLORIDE 0.9 % IV BOLUS (SEPSIS)
500.0000 mL | Freq: Once | INTRAVENOUS | Status: AC
  Administered 2017-01-20: 500 mL via INTRAVENOUS

## 2017-01-20 NOTE — ED Notes (Signed)
Pt taken to xray via stretcher  

## 2017-01-20 NOTE — ED Notes (Signed)
Pt. Verbalizes understanding of d/c instructions and follow-up. VS stable and pain controlled per pt.  Pt. In NAD at time of d/c and denies further concerns regarding this visit. Pt. Stable at the time of departure from the unit, departing unit by the safest and most appropriate manner per that pt condition and limitations. Pt advised to return to the ED at any time for emergent concerns, or for new/worsening symptoms.   esig pad did not transfer to computer when signed.

## 2017-01-20 NOTE — ED Notes (Signed)
Pt given [place] sandwich and water to drink. Pt states he has been drinking water and ate 2 cookies wife gave him already.

## 2017-01-20 NOTE — ED Notes (Signed)
EMS called to transport patient to [facility] ED 

## 2017-01-20 NOTE — ED Triage Notes (Signed)
Pt presents to ED via [facility] from [facility] for bradycardia. Pt has hx vertigo. EMS came to pt house earlier today for vertigo, informed that pt atleast needed to get checked out so wife to [place]. MUC called 911 with pt HR in 40's. Pt denies taking a beta blocker but takes other BP medications. Denies pain. Alert and oriented. States "swimmy headed" at current

## 2017-01-20 NOTE — ED Triage Notes (Signed)
Patient states he had an episode of vertigo on Saturday and stayed in bed all day Saturday and Sunday. Per patient he felt better today but suddenly about 2 pm got very dizzy and weak.

## 2017-01-20 NOTE — ED Provider Notes (Signed)
[facility] ____________________________________________  Time seen: Approximately 4:50 PM  I have reviewed the triage vital signs and the nursing notes.   HISTORY  Chief Complaint Dizziness  HPI Zachary [name] is a 76 y.o. male presents with wife at bedside for evaluation of dizziness and weakness. Patient reports that he does have a history of vertigo with occasional dizzy spells, somewhat similar to recent events. Patient states however he does not have back-to-back episodes of vertigo in his normal pattern. States this past Saturday he had what he describes as a vertigo episode that resolved, and then recurred multiple times today. States that he has wife did call EMS to the house approximate 2 hours ago, and reports that he is recommended to have further evaluation. Patient states that he currently has mild dizziness. States that with his dizziness the room is spinning. States these events are occurring at rest as well as upon standing. States that he has had near syncopal event today. Denies syncope. States accompanying generalized weakness and clamminess. Denies any unilateral weakness, paresthesias, vision changes, headaches, recent sickness, recent fevers, chest pain, shortness of breath or palpitations. Denies any previous cardiac history or renal insufficiency. Denies recent sickness. Denies recent antibiotic use. No over-the-counter medications taken prior to arrival.  Jerrol Banana., MD: PCP   Past Medical History:  Diagnosis Date  . Hypercholesterolemia   . Hypertension     Patient Active Problem List   Diagnosis Date Noted  . Pneumothorax on right 05/15/2015  . Benign essential tremor 02/28/2015  . HLD (hyperlipidemia) 02/28/2015  . BP (high blood pressure) 02/28/2015  . Hypercholesterolemia without hypertriglyceridemia 02/28/2015  . Head revolving around 02/28/2015    Past Surgical History:  Procedure Laterality Date  . APPENDECTOMY    .  CATARACT EXTRACTION Bilateral   . PLEURAL SCARIFICATION  05/2015     No current facility-administered medications for this encounter.   Current Outpatient Prescriptions:  .  aspirin EC 81 MG tablet, Take 81 mg by mouth at bedtime., Disp: , Rfl:  .  atorvastatin (LIPITOR) 20 MG tablet, TAKE ONE TABLET BY MOUTH AT BEDTIME, Disp: 90 tablet, Rfl: 4 .  lisinopril (PRINIVIL,ZESTRIL) 40 MG tablet, TAKE ONE TABLET BY MOUTH ONCE DAILY, Disp: 90 tablet, Rfl: 3  Allergies Patient has no known allergies.  Family History  Problem Relation Age of Onset  . Hypertension Mother   . Hyperlipidemia Mother   . Heart attack Father   . Hypertension Father   . CVA Father   . ALS Brother   . Prostate cancer Brother     Social History Social History  Substance Use Topics  . Smoking status: Never Smoker  . Smokeless tobacco: Never Used  . Alcohol use No    Review of Systems Constitutional: No fever/chills Eyes: No visual changes. Cardiovascular: Denies chest pain. Respiratory: Denies shortness of breath. Gastrointestinal: No abdominal pain.  No nausea, no vomiting.  No diarrhea.  No constipation. Genitourinary: Negative for dysuria. Musculoskeletal: Negative for back pain. Skin: Negative for rash. Neurological: Negative for headaches, focal weakness or numbness.   ____________________________________________   PHYSICAL EXAM:  VITAL SIGNS: ED Triage Vitals  Enc Vitals Group     BP 01/20/17 1635 (!) 146/87     Pulse Rate 01/20/17 1635 (!) 49     Resp 01/20/17 1635 18     Temp 01/20/17 1635 97.7 F (36.5 C)     Temp Source 01/20/17 1635 Oral     SpO2 01/20/17 1635 99 %  Weight 01/20/17 1636 198 lb (89.8 kg)     Height 01/20/17 1636 6\' 2"  (1.88 m)     Head Circumference --      Peak Flow --      Pain Score 01/20/17 1637 0     Pain Loc --      Pain Edu? --      Excl. in [place]? --     Constitutional: Alert and oriented. Well appearing and in no acute distress. Eyes:  Conjunctivae are normal. PERRL.  ENT      Head: Normocephalic and atraumatic.      Ears: no tender, no erythema, normal TMs bilaterally.       Nose: No congestion/rhinnorhea.      Mouth/Throat: Mucous membranes are moist.Oropharynx non-erythematous. Neck: No stridor. Supple without meningismus.  Hematological/Lymphatic/Immunilogical: No cervical lymphadenopathy. Cardiovascular: Bradycardia. Grossly normal heart sounds.  Good peripheral circulation. Respiratory: Normal respiratory effort without tachypnea nor retractions. Breath sounds are clear and equal bilaterally. No wheezes, rales, rhonchi. Gastrointestinal: Soft and nontender.  Musculoskeletal: 5/5 strength to bilateral upper and lower extremities. Neurologic:  Normal speech and language. No gross focal neurologic deficits are appreciated. Speech is normal. No gait instability.  Skin:  Skin is warm, dry and intact. No rash noted. Psychiatric: Mood and affect are normal. Speech and behavior are normal. Patient exhibits appropriate insight and judgment   ___________________________________________   LABS (all labs ordered are listed, but only abnormal results are displayed)  Labs Reviewed  GLUCOSE, CAPILLARY  CBG MONITORING, ED   ____________________________________________  EKG  ED ECG REPORT I, Marylene Land, the attending provider, personally viewed and interpreted this ECG.   Date: 01/20/2017  EKG Time: 1640   Rate: 46  Rhythm: sinus bradycardia with short PR  Axis:normal  Intervals:none  ST&T Change: none noted  Compared with ecg of 12/16  ____________________________________________  RADIOLOGY  No results found. ____________________________________________   PROCEDURES Procedures     INITIAL IMPRESSION / ASSESSMENT AND PLAN / ED COURSE  Pertinent labs & imaging results that were available during my care of the patient were reviewed by me and considered in my medical decision making (see chart for  details).  Overall well-appearing patient. Wife at bedside. Patient with known history of vertigo with similar description at this time, however reports atypical recurrence of vertigo description and with sensation of near syncope. Hypertension, hyperlipidemia and family medical history risk factors of cardiac and neurological events as well as patient noted to be bradycardic at this time. Discussed in detail with patient and family no focal neurological deficits however concerned with symptomatic bradycardia and recommend further evaluation ER at this time including cardiac evaluation. Recommend EMS transport, patient agrees to this plan. Blood sugar 80. Patient stable at time of discharge and EMS transfer. Marya Amsler RN charge nurse at  [facility] called and given report.   ____________________________________________   FINAL CLINICAL IMPRESSION(S) / ED DIAGNOSES  Final diagnoses:  Symptomatic bradycardia  Vertigo     Discharge Medication List as of 01/20/2017  5:24 PM      Note: This dictation was prepared with Dragon dictation along with smaller phrase technology. Any transcriptional errors that result from this process are unintentional.         Marylene Land, NP 01/20/17 1819

## 2017-01-20 NOTE — ED Provider Notes (Addendum)
[facility] Emergency Department Provider Note    First MD Initiated Contact with Patient 01/20/17 1758     (approximate)  I have reviewed the triage vital signs and the nursing notes.   HISTORY  Chief Complaint Bradycardia    HPI OLAOLUWA GRIEDER is a 76 y.o. male presents with chief complaint of intermittent dizziness and feeling that the room is spinning around. Has had 5 severe episodes in his life this being his fifth. States that he woke up this morning with severe vertiginous symptoms. States his symptoms lasted throughout the day times a B rapid coming go. He denies any numbness or tingling. No chest pain or shortness of breath. No blurry vision. No abdominal pain. No weakness. States that 1 episode today was associated with feeling that he is about to faint but he did not lose consciousness. Patient went to urgent care where triage vitals show that he did have mild bradycardia and so was sent to the ER for further workup due to concern for possible cardiac syncope. On arrival to the ER he states that his symptoms have improved and he denies any discomfort at this time. Reiterates that he did not have any chest pain or pressure. No associated achiness or discomfort in his jaw or shoulder.   Past Medical History:  Diagnosis Date  . Hypercholesterolemia   . Hypertension    Family History  Problem Relation Age of Onset  . Hypertension Mother   . Hyperlipidemia Mother   . Heart attack Father   . Hypertension Father   . CVA Father   . ALS Brother   . Prostate cancer Brother    Past Surgical History:  Procedure Laterality Date  . APPENDECTOMY    . CATARACT EXTRACTION Bilateral   . PLEURAL SCARIFICATION  05/2015   Patient Active Problem List   Diagnosis Date Noted  . Pneumothorax on right 05/15/2015  . Benign essential tremor 02/28/2015  . HLD (hyperlipidemia) 02/28/2015  . BP (high blood pressure) 02/28/2015  . Hypercholesterolemia  without hypertriglyceridemia 02/28/2015  . Head revolving around 02/28/2015      Prior to Admission medications   Medication Sig Start Date End Date Taking? Authorizing Provider  aspirin EC 81 MG tablet Take 81 mg by mouth at bedtime.   Yes [provider]  atorvastatin (LIPITOR) 20 MG tablet TAKE ONE TABLET BY MOUTH AT BEDTIME 02/12/16  Yes Birdie Sons, MD  lisinopril (PRINIVIL,ZESTRIL) 40 MG tablet TAKE ONE TABLET BY MOUTH ONCE DAILY 05/13/16  Yes Jerrol Banana., MD    Allergies Patient has no known allergies.    Social History Social History  Substance Use Topics  . Smoking status: Never Smoker  . Smokeless tobacco: Never Used  . Alcohol use No    Review of Systems Patient denies headaches, rhinorrhea, blurry vision, numbness, shortness of breath, chest pain, edema, cough, abdominal pain, nausea, vomiting, diarrhea, dysuria, fevers, rashes or hallucinations unless otherwise stated above in HPI. ____________________________________________   PHYSICAL EXAM:  VITAL SIGNS: Vitals:   01/20/17 1830 01/20/17 2100  BP: (!) 133/92 (!) 152/77  Pulse:  64  Resp: 16 19  Temp:    SpO2:  100%    Constitutional: Alert and oriented. Well appearing and in no acute distress. Eyes: Conjunctivae are normal.  Head: Atraumatic. Nose: No congestion/rhinnorhea. Mouth/Throat: Mucous membranes are moist.   Neck: No stridor. Painless ROM.  Cardiovascular: Normal rate, regular rhythm. Grossly normal heart sounds.  Good peripheral circulation. Respiratory:  Normal respiratory effort.  No retractions. Lungs CTAB. Gastrointestinal: Soft and nontender. No distention. No abdominal bruits. No CVA tenderness. Genitourinary:  Musculoskeletal: No lower extremity tenderness nor edema.  No joint effusions. Neurologic:  CN- intact. + inducible lateral nystagmus with rightward gaze. No facial droop, Normal FNF.  Normal heel to shin.  Sensation intact bilaterally. Normal speech and  language. No gross focal neurologic deficits are appreciated. No gait instability. Skin:  Skin is warm, dry and intact. No rash noted. Psychiatric: Mood and affect are normal. Speech and behavior are normal.  ____________________________________________   LABS (all labs ordered are listed, but only abnormal results are displayed)  Results for orders placed or performed during the hospital encounter of 01/20/17 (from the past 24 hour(s))  Basic metabolic panel     Status: Abnormal   Collection Time: 01/20/17  5:59 PM  Result Value Ref Range   Sodium 140 135 - 145 mmol/L   Potassium 3.9 3.5 - 5.1 mmol/L   Chloride 109 101 - 111 mmol/L   CO2 23 22 - 32 mmol/L   Glucose, Bld 95 65 - 99 mg/dL   BUN 22 (H) 6 - 20 mg/dL   Creatinine, Ser 1.33 (H) 0.61 - 1.24 mg/dL   Calcium 9.0 8.9 - 10.3 mg/dL   GFR calc non Af Amer 51 (L) >60 mL/min   GFR calc Af Amer 60 (L) >60 mL/min   Anion gap 8 5 - 15  CBC     Status: Abnormal   Collection Time: 01/20/17  5:59 PM  Result Value Ref Range   WBC 8.2 3.8 - 10.6 K/uL   RBC 4.21 (L) 4.40 - 5.90 MIL/uL   Hemoglobin 14.2 13.0 - 18.0 g/dL   HCT 40.9 40.0 - 52.0 %   MCV 97.2 80.0 - 100.0 fL   MCH 33.6 26.0 - 34.0 pg   MCHC 34.6 32.0 - 36.0 g/dL   RDW 13.5 11.5 - 14.5 %   Platelets 199 150 - 440 K/uL  Troponin I     Status: None   Collection Time: 01/20/17  5:59 PM  Result Value Ref Range   Troponin I <0.03 <0.03 ng/mL  Troponin I     Status: None   Collection Time: 01/20/17  8:41 PM  Result Value Ref Range   Troponin I <0.03 <0.03 ng/mL   ____________________________________________  EKG My review and personal interpretation at Time: 17:57   Indication: lightheadedness  Rate: 60  Rhythm: sinus Axis: normal Other: sinus bradycardia, short pr interval, normal qt, no stemi ____________________________________________  RADIOLOGY  I personally reviewed all radiographic images ordered to evaluate for the above acute complaints and reviewed  radiology reports and findings.  These findings were personally discussed with the patient.  Please see medical record for radiology report.  ____________________________________________   PROCEDURES  Procedure(s) performed:  Procedures    Critical Care performed: no ____________________________________________   INITIAL IMPRESSION / ASSESSMENT AND PLAN / ED COURSE  Pertinent labs & imaging results that were available during my care of the patient were reviewed by me and considered in my medical decision making (see chart for details).  DDX: dehydration,a cs, dysrhythmia, vertigo, electrolyte, cva  TYRIN HERBERS is a 76 y.o. who presents to the ED with vertiginous symptoms as described above sent to the ER for cardiac evaluation. Patient well-appearing and in no acute distress. CT imaging ordered to evaluate for above differential shows no evidence of CVA. he does have inducible nystagmus suggesting peripheral vertigo.  EKG shows sinus rhythm with shortened PR interval and suggestive of a delta wave and no evidence of acute ischemia.  The patient will be placed on continuous pulse oximetry and telemetry for monitoring.  Laboratory evaluation will be sent to evaluate for the above complaints.     Clinical Course as of Jan 21 2124  Mon Jan 20, 2017  1903 I discussed the patient's EKG with Dr. Ubaldo Glassing.  Patient does have likely accessory pathway but in the setting of no tachydysrhythmia no indication for intervention at this time. We'll continue to monitor patient with repeat enzyme.  [PR]  1924 Patient reassessed. Remains a symptomatically at this time. Discussed plan for repeat troponin for 3 or delta. Patient agreeable to plan. I do feel the patient will be appropriate for discharge home if repeat troponin is negative. Patient will follow-up with Dr. Ubaldo Glassing in clinic.  Patient be signed out to Dr. Joni Fears pending follow-up troponin.  [PR]    Clinical Course User Index [PR] Merlyn Lot, MD   ----------------------------------------- 9:24 PM on 01/20/2017 -----------------------------------------  Patient's repeat troponin resulted while still in the ER I went to reassess patient and he remains in no acute distress well appearing and asymptomatic. This provided for that is appropriate for further outpatient follow-up.  Have discussed with the patient and available family all diagnostics and treatments performed thus far and all questions were answered to the best of my ability. The patient demonstrates understanding and agreement with plan.   ____________________________________________   FINAL CLINICAL IMPRESSION(S) / ED DIAGNOSES  Final diagnoses:  Shortened PR interval  Vertigo  Bradycardia      NEW MEDICATIONS STARTED DURING THIS VISIT:  New Prescriptions   No medications on file     Note:  This document was prepared using Dragon voice recognition software and may include unintentional dictation errors.    Merlyn Lot, MD 01/20/17 Kathyrn Drown    Merlyn Lot, MD 01/20/17 2125

## 2017-01-21 ENCOUNTER — Telehealth: Payer: Self-pay | Admitting: Family Medicine

## 2017-01-21 NOTE — Telephone Encounter (Signed)
Wed at  8:45?

## 2017-01-21 NOTE — Telephone Encounter (Signed)
Pt was in ED on 01/20/17 - Pt states per ED MD his HR was 57-61 and was told he has bradycardia.  Would like to come see Dr. Rosanna Randy and needs to be worked in.

## 2017-01-21 NOTE — Telephone Encounter (Signed)
[id number] 

## 2017-01-21 NOTE — Telephone Encounter (Signed)
Please review, patient was last seen in June and heart rate was 52 the last 2 times, and in 50s the last few times he was seen.-aa

## 2017-01-22 ENCOUNTER — Ambulatory Visit (INDEPENDENT_AMBULATORY_CARE_PROVIDER_SITE_OTHER): Payer: Medicare Other | Admitting: Family Medicine

## 2017-01-22 VITALS — BP 130/84 | HR 55 | Temp 97.9°F | Resp 14 | Wt 201.0 lb

## 2017-01-22 DIAGNOSIS — R42 Dizziness and giddiness: Secondary | ICD-10-CM

## 2017-01-22 MED ORDER — MECLIZINE HCL 25 MG PO TABS: 25.0000 mg | ORAL_TABLET | Freq: Three times a day (TID) | ORAL | 0 refills | Status: DC | PRN

## 2017-01-22 NOTE — Telephone Encounter (Signed)
Pt called back and I scheduled an appointment for today at 8:45/MW

## 2017-01-22 NOTE — Progress Notes (Signed)
Zachary [name]  MRN: [id number] DOB: August 21, 1940  Subjective:  HPI   The patient is a 76 year old male who presents for hospital follow up after being seen in the ED for vertigo and bradycardia.    The patient states he was having difficult with vertigo on Friday and Saturday of last week.  On Monday he had it again and went to the Urgent Care in [place].  While there he had EKG.  Due to the his heart rate to be in the 50's, and his dizziness he was sent to the ED via rescue.  Once there he had another EKG and labs as well as consult with cardiologist, Dr Ubaldo Glassing.  He had short PR interval and bradycardia on EKG, normal Triponin level x 2, CBC and Met B were stable, CXR and head CT no acute disease. He has follow up with Dr Ubaldo Glassing in 1 week.  Since his ED visit his symptoms have subsided completely and he was able to mow yesterday and drive to his visit here today.    Patient Active Problem List   Diagnosis Date Noted  . Pneumothorax on right 05/15/2015  . Benign essential tremor 02/28/2015  . HLD (hyperlipidemia) 02/28/2015  . BP (high blood pressure) 02/28/2015  . Hypercholesterolemia without hypertriglyceridemia 02/28/2015  . Head revolving around 02/28/2015    Past Medical History:  Diagnosis Date  . Hypercholesterolemia   . Hypertension     Social History   Social History  . Marital status: Married    Spouse name: N/A  . Number of children: N/A  . Years of education: N/A   Occupational History  . Not on file.   Social History Main Topics  . Smoking status: Never Smoker  . Smokeless tobacco: Never Used  . Alcohol use No  . Drug use: No  . Sexual activity: Not on file   Other Topics Concern  . Not on file   Social History Narrative  . No narrative on file    Outpatient Encounter Prescriptions as of 01/22/2017  Medication Sig  . aspirin EC 81 MG tablet Take 81 mg by mouth at bedtime.  Marland Kitchen atorvastatin (LIPITOR) 20 MG tablet TAKE ONE TABLET BY MOUTH AT BEDTIME    . lisinopril (PRINIVIL,ZESTRIL) 40 MG tablet TAKE ONE TABLET BY MOUTH ONCE DAILY   No facility-administered encounter medications on file as of 01/22/2017.     No Known Allergies  Review of Systems  Constitutional: Negative for fever and malaise/fatigue.  Respiratory: Negative for cough, shortness of breath and wheezing.   Cardiovascular: Negative for chest pain, palpitations, orthopnea, claudication and leg swelling.  Neurological: Negative for dizziness, tingling, tremors, sensory change, speech change, weakness and headaches.    Objective:  BP 130/84 (BP Location: Right Arm, Patient Position: Sitting, Cuff Size: Normal)   Pulse (!) 55   Temp 97.9 F (36.6 C) (Oral)   Resp 14   Wt 201 lb (91.2 kg)   SpO2 98%   BMI 25.81 kg/m   Physical Exam  Constitutional: He is well-developed, well-nourished, and in no distress.  HENT:  Head: Normocephalic and atraumatic.  Eyes: Pupils are equal, round, and reactive to light. Conjunctivae are normal.  No nystagmus  Neck: Normal range of motion.  Cardiovascular: Normal rate, regular rhythm, normal heart sounds and intact distal pulses.   Pulmonary/Chest: Effort normal and breath sounds normal.    Assessment and Plan :   1. Vertigo  - meclizine (ANTIVERT) 25 MG tablet; Take 1  tablet (25 mg total) by mouth 3 (three) times daily as needed for dizziness.  Dispense: 30 tablet; Refill: 0  2.Mild Bradycardia Appt with cardiology.  HPI, Exam and A&P Transcribed under the direction and in the presence of Miguel Aschoff, Brooke Bonito., MD. Electronically Signed: Althea Charon, RMA I have done the exam and reviewed the chart and it is accurate to the best of my knowledge. Development worker, community has been used and  any errors in dictation or transcription are unintentional. Miguel Aschoff M.D. [facility]

## 2017-01-25 ENCOUNTER — Inpatient Hospital Stay: Payer: Medicare Other | Admitting: Family Medicine

## 2017-01-29 DIAGNOSIS — R42 Dizziness and giddiness: Secondary | ICD-10-CM | POA: Diagnosis not present

## 2017-01-29 DIAGNOSIS — Z85828 Personal history of other malignant neoplasm of skin: Secondary | ICD-10-CM | POA: Diagnosis not present

## 2017-01-29 DIAGNOSIS — R0789 Other chest pain: Secondary | ICD-10-CM | POA: Diagnosis not present

## 2017-01-29 DIAGNOSIS — Z86018 Personal history of other benign neoplasm: Secondary | ICD-10-CM | POA: Diagnosis not present

## 2017-01-29 DIAGNOSIS — L821 Other seborrheic keratosis: Secondary | ICD-10-CM | POA: Diagnosis not present

## 2017-01-29 DIAGNOSIS — R079 Chest pain, unspecified: Secondary | ICD-10-CM | POA: Diagnosis not present

## 2017-01-29 DIAGNOSIS — L570 Actinic keratosis: Secondary | ICD-10-CM | POA: Diagnosis not present

## 2017-03-11 DIAGNOSIS — R0789 Other chest pain: Secondary | ICD-10-CM | POA: Diagnosis not present

## 2017-03-13 DIAGNOSIS — R9430 Abnormal result of cardiovascular function study, unspecified: Secondary | ICD-10-CM | POA: Diagnosis not present

## 2017-03-13 DIAGNOSIS — R079 Chest pain, unspecified: Secondary | ICD-10-CM | POA: Diagnosis not present

## 2017-03-13 DIAGNOSIS — R42 Dizziness and giddiness: Secondary | ICD-10-CM | POA: Diagnosis not present

## 2017-03-27 DIAGNOSIS — I517 Cardiomegaly: Secondary | ICD-10-CM | POA: Diagnosis not present

## 2017-03-27 DIAGNOSIS — K449 Diaphragmatic hernia without obstruction or gangrene: Secondary | ICD-10-CM | POA: Diagnosis not present

## 2017-03-27 DIAGNOSIS — R9430 Abnormal result of cardiovascular function study, unspecified: Secondary | ICD-10-CM | POA: Diagnosis not present

## 2017-03-27 DIAGNOSIS — I2510 Atherosclerotic heart disease of native coronary artery without angina pectoris: Secondary | ICD-10-CM | POA: Diagnosis not present

## 2017-04-05 ENCOUNTER — Ambulatory Visit (INDEPENDENT_AMBULATORY_CARE_PROVIDER_SITE_OTHER): Payer: Medicare Other

## 2017-04-05 DIAGNOSIS — Z23 Encounter for immunization: Secondary | ICD-10-CM

## 2017-04-18 DIAGNOSIS — R42 Dizziness and giddiness: Secondary | ICD-10-CM | POA: Diagnosis not present

## 2017-05-08 ENCOUNTER — Other Ambulatory Visit: Payer: Self-pay | Admitting: Family Medicine

## 2017-05-14 ENCOUNTER — Ambulatory Visit: Payer: Medicare Other | Admitting: Family Medicine

## 2017-05-19 ENCOUNTER — Other Ambulatory Visit: Payer: Self-pay

## 2017-05-19 ENCOUNTER — Ambulatory Visit (INDEPENDENT_AMBULATORY_CARE_PROVIDER_SITE_OTHER): Payer: Medicare Other | Admitting: Family Medicine

## 2017-05-19 VITALS — BP 128/78 | HR 60 | Temp 97.7°F | Resp 16 | Wt 204.0 lb

## 2017-05-19 DIAGNOSIS — I10 Essential (primary) hypertension: Secondary | ICD-10-CM | POA: Diagnosis not present

## 2017-05-19 DIAGNOSIS — E785 Hyperlipidemia, unspecified: Secondary | ICD-10-CM

## 2017-05-19 NOTE — Progress Notes (Signed)
Zachary [name]  MRN: [id number] DOB: 05-21-1941  Subjective:  HPI   The patient is a 76 year old male who presents for follow up of his chronic illness.  He was last seen on 01/22/17 with an acute issue.  HIs last visit for routine care was on 11/12/16.  Hypertension-The patient is currently on Lisinopril for his blood pressure. At the time of his last visit he was having vertigo and his blood pressure was lower than it normally is for him.  He did have appointment set to see cardiology for bradycardia.  Patient had work up with echocardiogram, stress test, and CT cardiac angiogram.  Patient was diagnosed with CAD and some borderline abnormalities on stress echo but asymptomatic.  For his vertigo the the cardiologist suggested possible referral for ENT evaluation.  Patient states his vertigo is doing better.  He does not think he needs to see the ENT.  He has only had 1 episode since his last visit here and it was minimized with the use of the Meclizine.  Per the patient he was released from the cardiologist for 1 year.    BP Readings from Last 3 Encounters:  05/19/17 128/78  01/22/17 130/84  01/20/17 (!) 143/98    Patient Active Problem List   Diagnosis Date Noted  . Pneumothorax on right 05/15/2015  . Benign essential tremor 02/28/2015  . HLD (hyperlipidemia) 02/28/2015  . BP (high blood pressure) 02/28/2015  . Hypercholesterolemia without hypertriglyceridemia 02/28/2015  . Head revolving around 02/28/2015    Past Medical History:  Diagnosis Date  . Hypercholesterolemia   . Hypertension     Social History   Socioeconomic History  . Marital status: Married    Spouse name: Not on file  . Number of children: Not on file  . Years of education: Not on file  . Highest education level: Not on file  Social Needs  . Financial resource strain: Not on file  . Food insecurity - worry: Not on file  . Food insecurity - inability: Not on file  . Transportation needs - medical:  Not on file  . Transportation needs - non-medical: Not on file  Occupational History  . Not on file  Tobacco Use  . Smoking status: Never Smoker  . Smokeless tobacco: Never Used  Substance and Sexual Activity  . Alcohol use: No    Alcohol/week: 0.0 oz  . Drug use: No  . Sexual activity: Not on file  Other Topics Concern  . Not on file  Social History Narrative  . Not on file    Outpatient Encounter Medications as of 05/19/2017  Medication Sig  . aspirin EC 81 MG tablet Take 81 mg by mouth at bedtime.  Marland Kitchen atorvastatin (LIPITOR) 20 MG tablet TAKE ONE TABLET BY MOUTH AT BEDTIME  . lisinopril (PRINIVIL,ZESTRIL) 40 MG tablet TAKE ONE TABLET BY MOUTH ONCE DAILY  . meclizine (ANTIVERT) 25 MG tablet Take 1 tablet (25 mg total) by mouth 3 (three) times daily as needed for dizziness.   No facility-administered encounter medications on file as of 05/19/2017.     No Known Allergies  Review of Systems  Constitutional: Negative for fever and malaise/fatigue.  Eyes: Negative.   Respiratory: Negative for cough, shortness of breath and wheezing.   Cardiovascular: Negative for chest pain, palpitations, orthopnea, claudication and leg swelling.  Gastrointestinal: Negative.   Neurological: Negative for dizziness, weakness and headaches.  Endo/Heme/Allergies: Negative.   Psychiatric/Behavioral: Negative.     Objective:  BP 128/78 (  BP Location: Right Arm, Patient Position: Sitting, Cuff Size: Normal)   Pulse 60   Temp 97.7 F (36.5 C) (Oral)   Resp 16   Wt 204 lb (92.5 kg)   BMI 26.19 kg/m   Physical Exam  Constitutional: He is oriented to person, place, and time and well-developed, well-nourished, and in no distress.  HENT:  Head: Normocephalic and atraumatic.  Right Ear: External ear normal.  Left Ear: External ear normal.  Nose: Nose normal.  Eyes: Conjunctivae are normal. Pupils are equal, round, and reactive to light.  No nystagmus   Neck: Normal range of motion. Neck  supple.  Cardiovascular: Normal rate, regular rhythm and normal heart sounds.  Pulmonary/Chest: Effort normal and breath sounds normal.  Abdominal: Soft.  Neurological: He is alert and oriented to person, place, and time. Gait normal. GCS score is 15.  Skin: Skin is warm and dry.  Psychiatric: Mood, memory, affect and judgment normal.    Assessment and Plan :  1. Essential hypertension  - TSH - Comprehensive metabolic panel  2. Hyperlipidemia, unspecified hyperlipidemia type  - Comprehensive metabolic panel - Lipid panel  I have done the exam and reviewed the chart and it is accurate to the best of my knowledge. Development worker, community has been used and  any errors in dictation or transcription are unintentional. Miguel Aschoff M.D. [facility]

## 2017-05-22 DIAGNOSIS — E785 Hyperlipidemia, unspecified: Secondary | ICD-10-CM | POA: Diagnosis not present

## 2017-05-22 DIAGNOSIS — I10 Essential (primary) hypertension: Secondary | ICD-10-CM | POA: Diagnosis not present

## 2017-05-22 LAB — TSH: TSH: 3.83 mIU/L (ref 0.40–4.50)

## 2017-05-22 LAB — COMPLETE METABOLIC PANEL WITH GFR
AG Ratio: 1.6 (calc) (ref 1.0–2.5)
ALT: 11 U/L (ref 9–46)
AST: 21 U/L (ref 10–35)
Albumin: 3.6 g/dL (ref 3.6–5.1)
Alkaline phosphatase (APISO): 57 U/L (ref 40–115)
BUN: 15 mg/dL (ref 7–25)
CALCIUM: 9 mg/dL (ref 8.6–10.3)
CO2: 24 mmol/L (ref 20–32)
Chloride: 108 mmol/L (ref 98–110)
Creat: 1.14 mg/dL (ref 0.70–1.18)
GFR, EST NON AFRICAN AMERICAN: 63 mL/min/{1.73_m2} (ref >=60)
GFR, Est African American: 73 mL/min/{1.73_m2} (ref >=60)
GLOBULIN: 2.2 g/dL (ref 1.9–3.7)
Glucose, Bld: 81 mg/dL (ref 65–99)
Potassium: 4.1 mmol/L (ref 3.5–5.3)
SODIUM: 140 mmol/L (ref 135–146)
Total Bilirubin: 0.8 mg/dL (ref 0.2–1.2)
Total Protein: 5.8 g/dL — ABNORMAL LOW (ref 6.1–8.1)

## 2017-05-22 LAB — LIPID PANEL
CHOL/HDL RATIO: 2.6 (calc) (ref <5.0)
CHOLESTEROL: 172 mg/dL (ref <200)
HDL: 66 mg/dL (ref >40)
LDL Cholesterol (Calc): 92 mg/dL (calc)
Non-HDL Cholesterol (Calc): 106 mg/dL (calc) (ref <130)
Triglycerides: 56 mg/dL (ref <150)

## 2017-06-09 DIAGNOSIS — Z961 Presence of intraocular lens: Secondary | ICD-10-CM | POA: Diagnosis not present

## 2017-06-25 ENCOUNTER — Encounter: Payer: Self-pay | Admitting: Family Medicine

## 2017-06-25 ENCOUNTER — Ambulatory Visit (INDEPENDENT_AMBULATORY_CARE_PROVIDER_SITE_OTHER): Payer: Medicare Other | Admitting: Family Medicine

## 2017-06-25 VITALS — BP 140/70 | HR 61 | Temp 98.5°F | Resp 15 | Wt 204.2 lb

## 2017-06-25 DIAGNOSIS — J069 Acute upper respiratory infection, unspecified: Secondary | ICD-10-CM

## 2017-06-25 MED ORDER — HYDROCODONE-HOMATROPINE 5-1.5 MG/5ML PO SYRP: ORAL_SOLUTION | ORAL | 0 refills | Status: DC

## 2017-06-25 NOTE — Progress Notes (Signed)
Subjective:     Patient ID: Zachary [name], male   DOB: 07-Dec-1940, 77 y.o.   MRN: [id number] Chief Complaint  Patient presents with  . Cough    Patient comes in office today with concerns of cough and congestions for <24hrs. Patient states that he has a burning sensation in back of his throat when cough and pain in his lower chest.    HPI Reports chills but no fever. +flu shot.  Review of Systems     Objective:   Physical Exam  Constitutional: He appears well-developed and well-nourished. No distress.  Ears: T.M's intact without inflammation Throat: no tonsillar enlargement or exudate Neck: no cervical adenopathy Lungs: clear     Assessment:    1. Viral upper respiratory tract infection - HYDROcodone-homatropine (HYCODAN) 5-1.5 MG/5ML syrup; 5 ml 4-6 hours as needed for cough  Dispense: 120 mL; Refill: 0    Plan:    Discussed use of Mucinex, saline nasal spray, Claritin for drip and Delsym.

## 2017-06-25 NOTE — Patient Instructions (Addendum)
Discussed use of Mucinex, Saline nasal spray, Claritin or similar for nasal drip, and Delsym for cough.

## 2017-10-13 ENCOUNTER — Ambulatory Visit: Payer: Self-pay | Admitting: Family Medicine

## 2017-10-23 ENCOUNTER — Ambulatory Visit (INDEPENDENT_AMBULATORY_CARE_PROVIDER_SITE_OTHER): Payer: Medicare Other | Admitting: Family Medicine

## 2017-10-23 ENCOUNTER — Encounter: Payer: Self-pay | Admitting: Family Medicine

## 2017-10-23 VITALS — BP 110/64 | HR 78 | Temp 98.5°F | Resp 16 | Ht 75.0 in | Wt 196.0 lb

## 2017-10-23 DIAGNOSIS — E7800 Pure hypercholesterolemia, unspecified: Secondary | ICD-10-CM | POA: Diagnosis not present

## 2017-10-23 DIAGNOSIS — I10 Essential (primary) hypertension: Secondary | ICD-10-CM

## 2017-10-23 NOTE — Progress Notes (Signed)
Patient: Zachary [name] Male    DOB: 31-Aug-1940   77 y.o.   MRN: [id number] Visit Date: 10/23/2017  Today's Provider: [name], MD   Chief Complaint  Patient presents with  . Hypertension  . Hyperlipidemia   Subjective:    HPI  Hypertension, follow-up:  BP Readings from Last 3 Encounters:  10/23/17 110/64  06/25/17 140/70  05/19/17 128/78    He was last seen for hypertension 5 months ago.  BP at that visit was 128/78. Management since that visit includes no changes. He reports good compliance with treatment. He is not having side effects.  He is exercising. He is adherent to low salt diet.   Outside blood pressures are checked occasionally. He is experiencing none.  Patient denies exertional chest pressure/discomfort, lower extremity edema and palpitations.   Cardiovascular risk factors include dyslipidemia.   Weight trend: stable Wt Readings from Last 3 Encounters:  10/23/17 196 lb (88.9 kg)  06/25/17 204 lb 3.2 oz (92.6 kg)  05/19/17 204 lb (92.5 kg)    Current diet: well balanced    Lipid/Cholesterol, Follow-up:   Last seen for this5 months ago.  Management changes since that visit include no changes. . Last Lipid Panel:    Component Value Date/Time   CHOL 172 05/22/2017 1005   CHOL 189 05/06/2016 0921   TRIG 56 05/22/2017 1005   HDL 66 05/22/2017 1005   HDL 70 05/06/2016 0921   CHOLHDL 2.6 05/22/2017 1005   LDLCALC 92 05/22/2017 1005    Risk factors for vascular disease include hypertension  He reports good compliance with treatment. He is not having side effects.  Current symptoms include none and have been stable. Weight trend: stable Prior visit with dietician: no  No Known Allergies   Current Outpatient Medications:  .  aspirin EC 81 MG tablet, Take 81 mg by mouth at bedtime., Disp: , Rfl:  .  atorvastatin (LIPITOR) 20 MG tablet, TAKE ONE TABLET BY MOUTH AT BEDTIME, Disp: 90 tablet, Rfl: 4 .   HYDROcodone-homatropine (HYCODAN) 5-1.5 MG/5ML syrup, 5 ml 4-6 hours as needed for cough, Disp: 120 mL, Rfl: 0 .  lisinopril (PRINIVIL,ZESTRIL) 40 MG tablet, TAKE ONE TABLET BY MOUTH ONCE DAILY, Disp: 90 tablet, Rfl: 3 .  meclizine (ANTIVERT) 25 MG tablet, Take 1 tablet (25 mg total) by mouth 3 (three) times daily as needed for dizziness. (Patient not taking: Reported on 06/25/2017), Disp: 30 tablet, Rfl: 0  Review of Systems  Constitutional: Negative for activity change, appetite change, chills, diaphoresis, fatigue, fever and unexpected weight change.  Eyes: Negative.   Respiratory: Negative for shortness of breath.   Cardiovascular: Negative for chest pain, palpitations and leg swelling.  Gastrointestinal: Negative.   Endocrine: Negative.   Musculoskeletal: Negative for arthralgias and back pain.  Allergic/Immunologic: Negative.   Neurological: Negative.   Psychiatric/Behavioral: Negative.     Social History   Tobacco Use  . Smoking status: Never Smoker  . Smokeless tobacco: Never Used  Substance Use Topics  . Alcohol use: No    Alcohol/week: 0.0 oz   Objective:   BP 110/64 (BP Location: Right Arm, Patient Position: Sitting, Cuff Size: Normal)   Pulse 78   Temp 98.5 F (36.9 C)   Resp 16   Ht 6\' 3"  (1.905 m)   Wt 196 lb (88.9 kg)   SpO2 96%   BMI 24.50 kg/m  Vitals:   10/23/17 1543  BP: 110/64  Pulse: 78  Resp:  16  Temp: 98.5 F (36.9 C)  SpO2: 96%  Weight: 196 lb (88.9 kg)  Height: 6\' 3"  (1.905 m)     Physical Exam  Constitutional: He is oriented to person, place, and time. He appears well-developed and well-nourished.  HENT:  Head: Normocephalic and atraumatic.  Eyes: Conjunctivae are normal. No scleral icterus.  Neck: No thyromegaly present.  Cardiovascular: Normal rate, regular rhythm and normal heart sounds.  Pulmonary/Chest: Effort normal and breath sounds normal.  Abdominal: Soft.  Lymphadenopathy:    He has no cervical adenopathy.  Neurological:  He is alert and oriented to person, place, and time.  Skin: Skin is warm and dry.  Psychiatric: He has a normal mood and affect. His behavior is normal. Judgment and thought content normal.        Assessment & Plan:     HTN HLD No changes,pt has lost weight since last OV.     I have done the exam and reviewed the above chart and it is accurate to the best of my knowledge. Development worker, community has been used in this note in any air is in the dictation or transcription are unintentional.  [name], MD  [place]

## 2017-11-19 ENCOUNTER — Ambulatory Visit (INDEPENDENT_AMBULATORY_CARE_PROVIDER_SITE_OTHER): Payer: Medicare Other

## 2017-11-19 VITALS — BP 124/74 | HR 60 | Temp 98.1°F | Ht 75.0 in | Wt 197.0 lb

## 2017-11-19 DIAGNOSIS — Z0000 Encounter for general adult medical examination without abnormal findings: Secondary | ICD-10-CM | POA: Diagnosis not present

## 2017-11-19 NOTE — Progress Notes (Signed)
Subjective:   Zachary [name] is a 77 y.o. male who presents for Medicare Annual/Subsequent preventive examination.  Review of Systems:  N/A  Cardiac Risk Factors include: dyslipidemia;hypertension;advanced age (>51men, >77 women);male gender     Objective:    Vitals: BP 124/74 (BP Location: Right Arm)   Pulse 60   Temp 98.1 F (36.7 C) (Oral)   Ht 6\' 3"  (1.905 m)   Wt 197 lb (89.4 kg)   BMI 24.62 kg/m   Body mass index is 24.62 kg/m.  Advanced Directives 11/19/2017 01/22/2017 01/20/2017 11/12/2016 05/15/2015 05/15/2015  Does Patient Have a Medical Advance Directive? Yes Yes Yes No Yes No  Type of Paramedic of [place];Living will Living will;Healthcare Power of Attorney Living will - Living will -  Does patient want to make changes to medical advance directive? - - - No - Patient declined No - Patient declined -  Copy of [place] in Chart? No - copy requested - - - No - copy requested -  Would patient like information on creating a medical advance directive? - - - - No - patient declined information No - patient declined information    Tobacco Social History   Tobacco Use  Smoking Status Never Smoker  Smokeless Tobacco Never Used     Counseling given: Not Answered   Clinical Intake:  Pre-visit preparation completed: Yes  Pain : No/denies pain Pain Score: 0-No pain     Nutritional Status: BMI of 19-24  Normal Nutritional Risks: None Diabetes: No  How often do you need to have someone help you when you read instructions, pamphlets, or other written materials from your doctor or pharmacy?: 1 - Never  Interpreter Needed?: No  Information entered by :: [facility], LPN  Past Medical History:  Diagnosis Date  . Hypercholesterolemia   . Hypertension    Past Surgical History:  Procedure Laterality Date  . APPENDECTOMY    . CATARACT EXTRACTION Bilateral   . PLEURAL SCARIFICATION  05/2015   Family History    Problem Relation Age of Onset  . Hypertension Mother   . Hyperlipidemia Mother   . Heart attack Father   . Hypertension Father   . CVA Father   . ALS Brother   . Prostate cancer Brother    Social History   Socioeconomic History  . Marital status: Married    Spouse name: Not on file  . Number of children: 0  . Years of education: Not on file  . Highest education level: 12th grade  Occupational History  . Occupation: retired  Scientific laboratory technician  . Financial resource strain: Not hard at all  . Food insecurity:    Worry: Never true    Inability: Never true  . Transportation needs:    Medical: No    Non-medical: No  Tobacco Use  . Smoking status: Never Smoker  . Smokeless tobacco: Never Used  Substance and Sexual Activity  . Alcohol use: No    Alcohol/week: 0.0 oz  . Drug use: No  . Sexual activity: Not on file  Lifestyle  . Physical activity:    Days per week: Not on file    Minutes per session: Not on file  . Stress: Not at all  Relationships  . Social connections:    Talks on phone: Not on file    Gets together: Not on file    Attends religious service: Not on file    Active member of club or organization: Not  on file    Attends meetings of clubs or organizations: Not on file    Relationship status: Not on file  Other Topics Concern  . Not on file  Social History Narrative  . Not on file    Outpatient Encounter Medications as of 11/19/2017  Medication Sig  . aspirin EC 81 MG tablet Take 81 mg by mouth at bedtime.  Marland Kitchen atorvastatin (LIPITOR) 20 MG tablet TAKE ONE TABLET BY MOUTH AT BEDTIME  . HYDROcodone-homatropine (HYCODAN) 5-1.5 MG/5ML syrup 5 ml 4-6 hours as needed for cough  . lisinopril (PRINIVIL,ZESTRIL) 40 MG tablet TAKE ONE TABLET BY MOUTH ONCE DAILY  . meclizine (ANTIVERT) 25 MG tablet Take 1 tablet (25 mg total) by mouth 3 (three) times daily as needed for dizziness.   No facility-administered encounter medications on file as of 11/19/2017.      Activities of Daily Living In your present state of health, do you have any difficulty performing the following activities: 11/19/2017  Hearing? N  Vision? N  Difficulty concentrating or making decisions? N  Walking or climbing stairs? N  Dressing or bathing? N  Doing errands, shopping? N  Preparing Food and eating ? N  Using the Toilet? N  In the past six months, have you accidently leaked urine? N  Do you have problems with loss of bowel control? N  Managing your Medications? N  Managing your Finances? N  Housekeeping or managing your Housekeeping? N  Some recent data might be hidden    Patient Care Team: Jerrol Banana., MD as PCP - General (Family Medicine) Dingeldein, Remo Lipps, MD as Consulting Physician (Ophthalmology)   Assessment:   This is a routine wellness examination for [place].  Exercise Activities and Dietary recommendations Current Exercise Habits: Home exercise routine, Type of exercise: walking;Other - see comments(mows yard), Time (Minutes): 30, Frequency (Times/Week): 6, Weekly Exercise (Minutes/Week): 180, Intensity: Mild, Exercise limited by: None identified  Goals    . DIET - INCREASE WATER INTAKE     Recommend increasing water intake to 6-8 glasses a day.        Fall Risk Fall Risk  11/19/2017 12/30/2016 11/12/2016 07/26/2015 05/15/2015  Falls in the past year? No Yes No No No  Comment - Emmi Telephone Survey: data to providers prior to load - - -  Number falls in past yr: - 1 - - -  Comment - Emmi Telephone Survey Actual Response = 1 - - -  Injury with Fall? - No - - -   Is the patient's home free of loose throw rugs in walkways, pet beds, electrical cords, etc?   yes      Grab bars in the bathroom? yes      Handrails on the stairs?   no      Adequate lighting?   yes  Timed Get Up and Go Performed: N/A  Depression Screen PHQ 2/9 Scores 11/19/2017 11/12/2016 11/12/2016 07/26/2015  PHQ - 2 Score 0 0 0 0  PHQ- 9 Score - 0 0 -    Cognitive  Function: Pt declined screening today.      6CIT Screen 11/12/2016  What Year? 0 points  What month? 0 points  What time? 0 points  Count back from 20 0 points  Months in reverse 0 points  Repeat phrase 0 points  Total Score 0    Immunization History  Administered Date(s) Administered  . Influenza, High Dose Seasonal PF 03/01/2015, 05/06/2016, 04/05/2017  . Pneumococcal Conjugate-13 06/09/2014  .  Pneumococcal Polysaccharide-23 04/07/1996, 07/26/2015  . Td 02/10/2002  . Tdap 06/09/2014  . Zoster 06/30/2013    Qualifies for Shingles Vaccine? Due for Shingles vaccine. Declined my offer to administer today. Education has been provided regarding the importance of this vaccine. Pt has been advised to call her insurance company to determine her out of pocket expense. Advised she may also receive this vaccine at her local pharmacy or Health Dept. Verbalized acceptance and understanding.  Screening Tests Health Maintenance  Topic Date Due  . INFLUENZA VACCINE  01/01/2018  . COLONOSCOPY  03/10/2021  . TETANUS/TDAP  06/09/2024  . PNA vac Low Risk Adult  Completed   Cancer Screenings: Lung: Low Dose CT Chest recommended if Age 63-80 years, 30 pack-year currently smoking OR have quit w/in 15years. Patient does not qualify. Colorectal: Up to date  Additional Screenings:  Hepatitis C Screening: N/A      Plan:  I have personally reviewed and addressed the Medicare Annual Wellness questionnaire and have noted the following in the patient's chart:  A. Medical and social history B. Use of alcohol, tobacco or illicit drugs  C. Current medications and supplements D. Functional ability and status E.  Nutritional status F.  Physical activity G. Advance directives H. List of other physicians I.  Hospitalizations, surgeries, and ER visits in previous 12 months J.  [place] such as hearing and vision if needed, cognitive and depression L. Referrals and appointments - none  In  addition, I have reviewed and discussed with patient certain preventive protocols, quality metrics, and best practice recommendations. A written personalized care plan for preventive services as well as general preventive health recommendations were provided to patient.  See attached scanned questionnaire for additional information.   Signed,  Fabio Neighbors, LPN Nurse Health Advisor   Nurse Recommendations: None.

## 2017-11-19 NOTE — Patient Instructions (Addendum)
Zachary [name] , Thank you for taking time to come for your Medicare Wellness Visit. I appreciate your ongoing commitment to your health goals. Please review the following plan we discussed and let me know if I can assist you in the future.   Screening recommendations/referrals: Colonoscopy: Up to date Recommended yearly ophthalmology/optometry visit for glaucoma screening and checkup Recommended yearly dental visit for hygiene and checkup  Vaccinations: Influenza vaccine: Up to date Pneumococcal vaccine: Up to date Tdap vaccine: Up to date Shingles vaccine: Pt declines today.     Advanced directives: Please bring a copy of your POA (Power of Attorney) and/or Living Will to your next appointment.   Conditions/risks identified: Recommend increasing water intake to 6-8 glasses a day.   Next appointment: 04/27/18 @ 8:40 AM with Dr Rosanna Randy.   Preventive Care 13 Years and Older, Male Preventive care refers to lifestyle choices and visits with your health care provider that can promote health and wellness. What does preventive care include?  A yearly physical exam. This is also called an annual well check.  Dental exams once or twice a year.  Routine eye exams. Ask your health care provider how often you should have your eyes checked.  Personal lifestyle choices, including:  Daily care of your teeth and gums.  Regular physical activity.  Eating a healthy diet.  Avoiding tobacco and drug use.  Limiting alcohol use.  Practicing safe sex.  Taking low doses of aspirin every day.  Taking vitamin and mineral supplements as recommended by your health care provider. What happens during an annual well check? The services and screenings done by your health care provider during your annual well check will depend on your age, overall health, lifestyle risk factors, and family history of disease. Counseling  Your health care provider may ask you questions about your:  Alcohol  use.  Tobacco use.  Drug use.  Emotional well-being.  Home and relationship well-being.  Sexual activity.  Eating habits.  History of falls.  Memory and ability to understand (cognition).  Work and work Statistician. Screening  You may have the following tests or measurements:  Height, weight, and BMI.  Blood pressure.  Lipid and cholesterol levels. These may be checked every 5 years, or more frequently if you are over 67 years old.  Skin check.  Lung cancer screening. You may have this screening every year starting at age 6 if you have a 30-pack-year history of smoking and currently smoke or have quit within the past 15 years.  Fecal occult blood test (FOBT) of the stool. You may have this test every year starting at age 63.  Flexible sigmoidoscopy or colonoscopy. You may have a sigmoidoscopy every 5 years or a colonoscopy every 10 years starting at age 38.  Prostate cancer screening. Recommendations will vary depending on your family history and other risks.  Hepatitis C blood test.  Hepatitis B blood test.  Sexually transmitted disease (STD) testing.  Diabetes screening. This is done by checking your blood sugar (glucose) after you have not eaten for a while (fasting). You may have this done every 1-3 years.  Abdominal aortic aneurysm (AAA) screening. You may need this if you are a current or former smoker.  Osteoporosis. You may be screened starting at age 47 if you are at high risk. Talk with your health care provider about your test results, treatment options, and if necessary, the need for more tests. Vaccines  Your health care provider may recommend certain vaccines, such as:  Influenza vaccine. This is recommended every year.  Tetanus, diphtheria, and acellular pertussis (Tdap, Td) vaccine. You may need a Td booster every 10 years.  Zoster vaccine. You may need this after age 19.  Pneumococcal 13-valent conjugate (PCV13) vaccine. One dose is  recommended after age 32.  Pneumococcal polysaccharide (PPSV23) vaccine. One dose is recommended after age 51. Talk to your health care provider about which screenings and vaccines you need and how often you need them. This information is not intended to replace advice given to you by your health care provider. Make sure you discuss any questions you have with your health care provider. Document Released: 06/16/2015 Document Revised: 02/07/2016 Document Reviewed: 03/21/2015 Elsevier Interactive Patient Education  2017 [place] Prevention in the Home Falls can cause injuries. They can happen to people of all ages. There are many things you can do to make your home safe and to help prevent falls. What can I do on the outside of my home?  Regularly fix the edges of walkways and driveways and fix any cracks.  Remove anything that might make you trip as you walk through a door, such as a raised step or threshold.  Trim any bushes or trees on the path to your home.  Use bright outdoor lighting.  Clear any walking paths of anything that might make someone trip, such as rocks or tools.  Regularly check to see if handrails are loose or broken. Make sure that both sides of any steps have handrails.  Any raised decks and porches should have guardrails on the edges.  Have any leaves, snow, or ice cleared regularly.  Use sand or salt on walking paths during winter.  Clean up any spills in your garage right away. This includes oil or grease spills. What can I do in the bathroom?  Use night lights.  Install grab bars by the toilet and in the tub and shower. Do not use towel bars as grab bars.  Use non-skid mats or decals in the tub or shower.  If you need to sit down in the shower, use a plastic, non-slip stool.  Keep the floor dry. Clean up any water that spills on the floor as soon as it happens.  Remove soap buildup in the tub or shower regularly.  Attach bath mats  securely with double-sided non-slip rug tape.  Do not have throw rugs and other things on the floor that can make you trip. What can I do in the bedroom?  Use night lights.  Make sure that you have a light by your bed that is easy to reach.  Do not use any sheets or blankets that are too big for your bed. They should not hang down onto the floor.  Have a firm chair that has side arms. You can use this for support while you get dressed.  Do not have throw rugs and other things on the floor that can make you trip. What can I do in the kitchen?  Clean up any spills right away.  Avoid walking on wet floors.  Keep items that you use a lot in easy-to-reach places.  If you need to reach something above you, use a strong step stool that has a grab bar.  Keep electrical cords out of the way.  Do not use floor polish or wax that makes floors slippery. If you must use wax, use non-skid floor wax.  Do not have throw rugs and other things on the floor that  can make you trip. What can I do with my stairs?  Do not leave any items on the stairs.  Make sure that there are handrails on both sides of the stairs and use them. Fix handrails that are broken or loose. Make sure that handrails are as long as the stairways.  Check any carpeting to make sure that it is firmly attached to the stairs. Fix any carpet that is loose or worn.  Avoid having throw rugs at the top or bottom of the stairs. If you do have throw rugs, attach them to the floor with carpet tape.  Make sure that you have a light switch at the top of the stairs and the bottom of the stairs. If you do not have them, ask someone to add them for you. What else can I do to help prevent falls?  Wear shoes that:  Do not have high heels.  Have rubber bottoms.  Are comfortable and fit you well.  Are closed at the toe. Do not wear sandals.  If you use a stepladder:  Make sure that it is fully opened. Do not climb a closed  stepladder.  Make sure that both sides of the stepladder are locked into place.  Ask someone to hold it for you, if possible.  Clearly mark and make sure that you can see:  Any grab bars or handrails.  First and last steps.  Where the edge of each step is.  Use tools that help you move around (mobility aids) if they are needed. These include:  Canes.  Walkers.  Scooters.  Crutches.  Turn on the lights when you go into a dark area. Replace any light bulbs as soon as they burn out.  Set up your furniture so you have a clear path. Avoid moving your furniture around.  If any of your floors are uneven, fix them.  If there are any pets around you, be aware of where they are.  Review your medicines with your doctor. Some medicines can make you feel dizzy. This can increase your chance of falling. Ask your doctor what other things that you can do to help prevent falls. This information is not intended to replace advice given to you by your health care provider. Make sure you discuss any questions you have with your health care provider. Document Released: 03/16/2009 Document Revised: 10/26/2015 Document Reviewed: 06/24/2014 Elsevier Interactive Patient Education  2017 [employer].

## 2017-11-25 IMAGING — CR DG CHEST 1V
1 series · 1 of 1 positions shown · non-contrast
Comparison: 05/22/2015

CLINICAL DATA: Spontaneous pneumothorax

EXAM:
CHEST 1 VIEW

[dg chest 1 view]
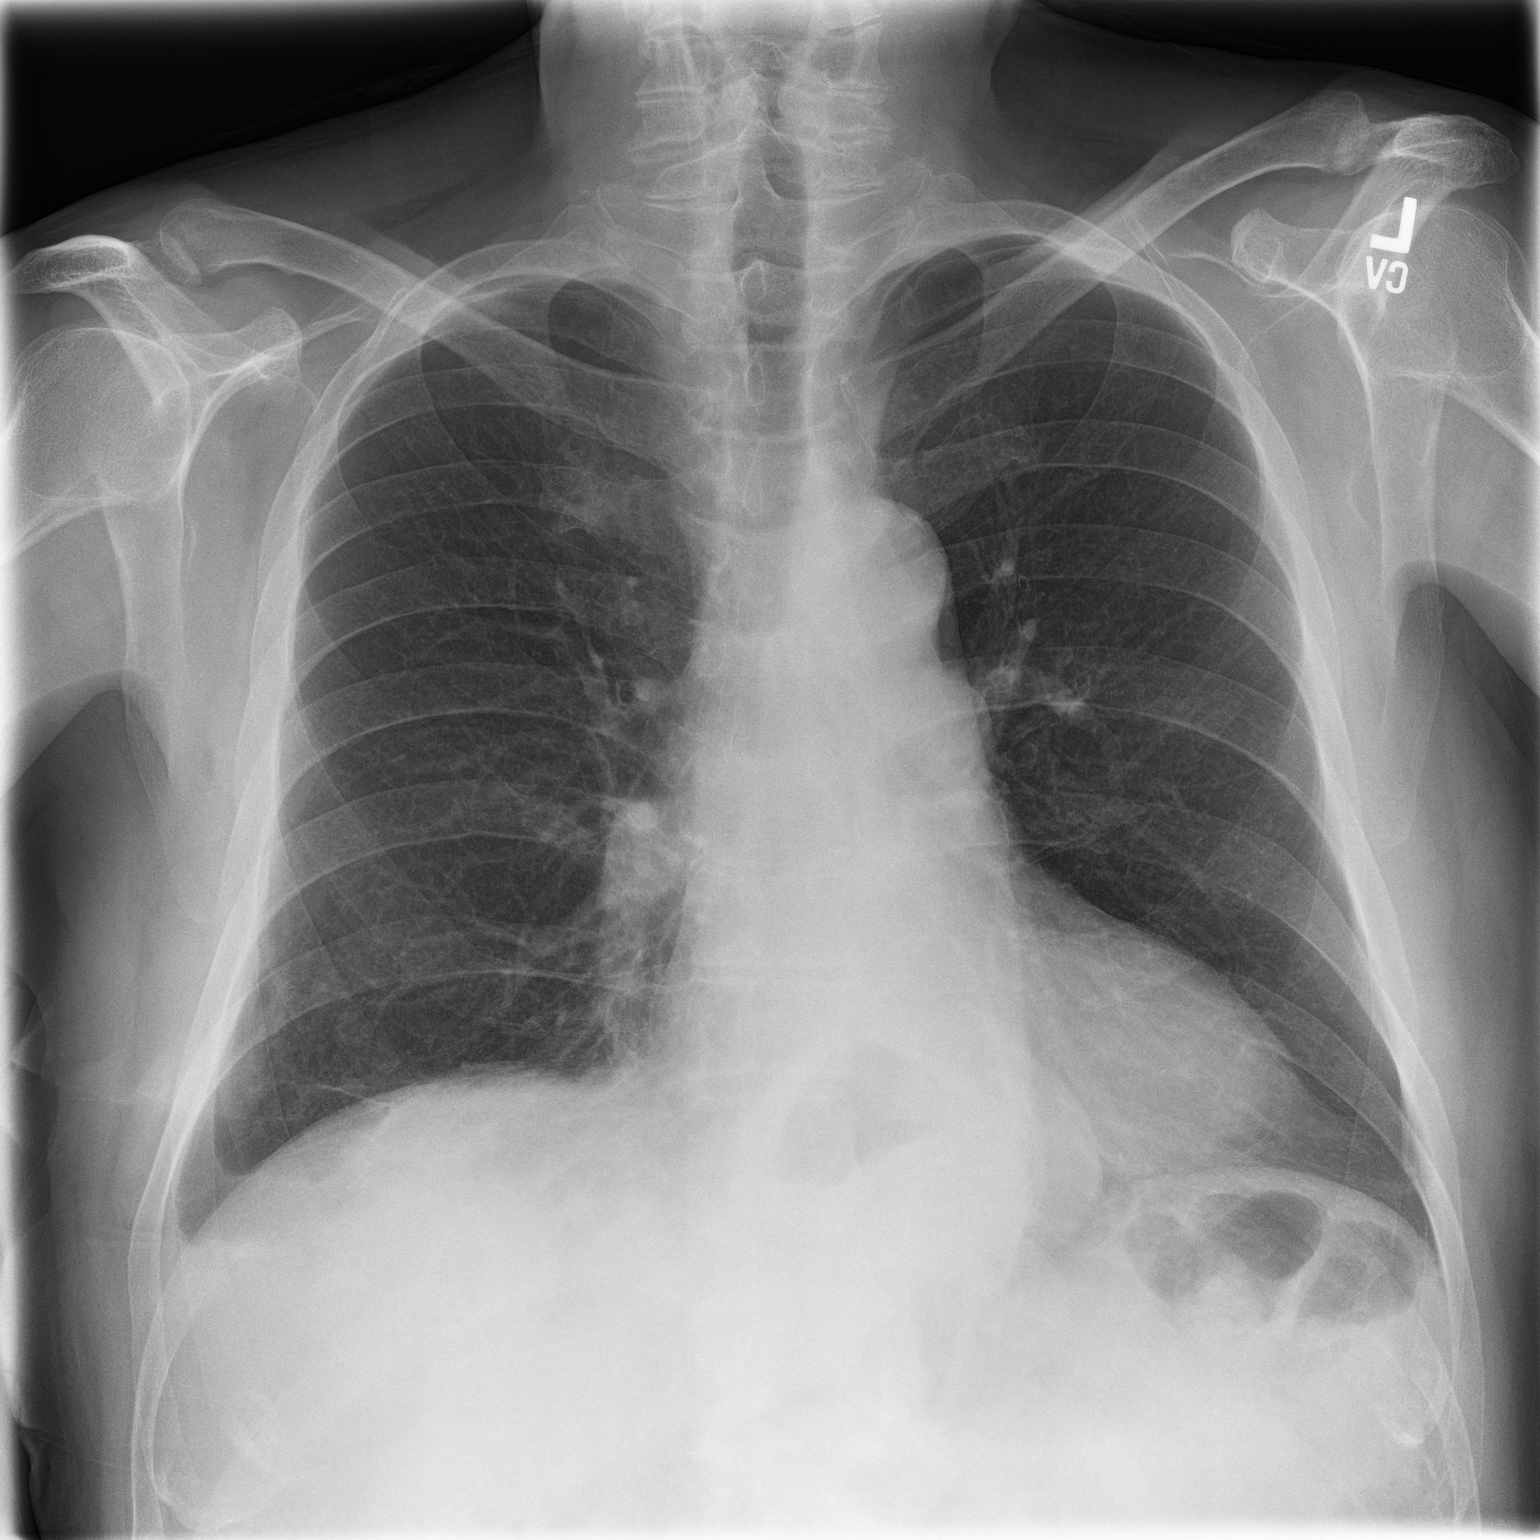

[1 of 1 positions shown; findings below may reference images not displayed]

FINDINGS: Right chest tube has been removed. No pneumothorax. Slight blunting
of the right costophrenic angle.

Negative for heart failure or pneumonia. Mild right lower lobe
atelectasis. Hiatal hernia.
IMPRESSION: No pneumothorax post right chest tube removal. Minimal right lower
lobe atelectasis and effusion

## 2017-12-08 DIAGNOSIS — H53411 Scotoma involving central area, right eye: Secondary | ICD-10-CM | POA: Diagnosis not present

## 2017-12-08 IMAGING — CR DG CHEST 2V
2 series · 2 of 2 positions shown · non-contrast
Comparison: 05/24/2015 .  05/15/2015.

CLINICAL DATA: Pain.

EXAM:
CHEST  2 VIEW

[chest pa]
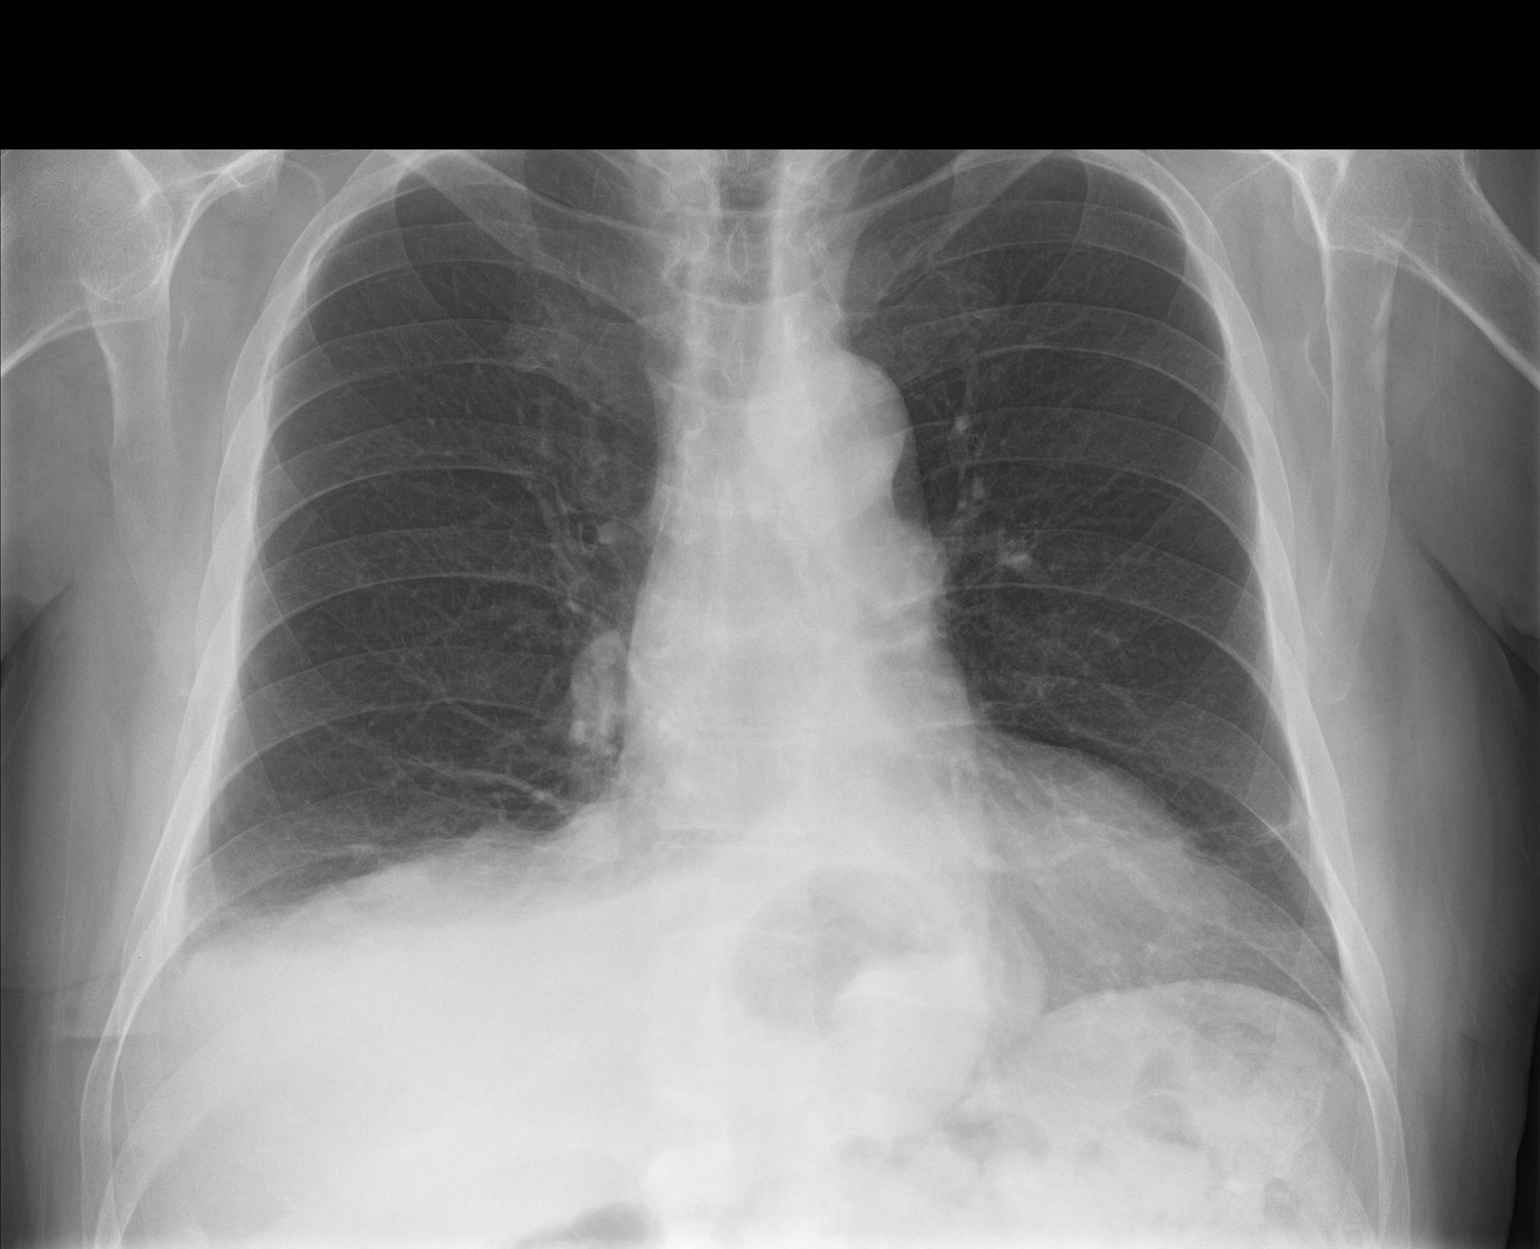

[chest lat]
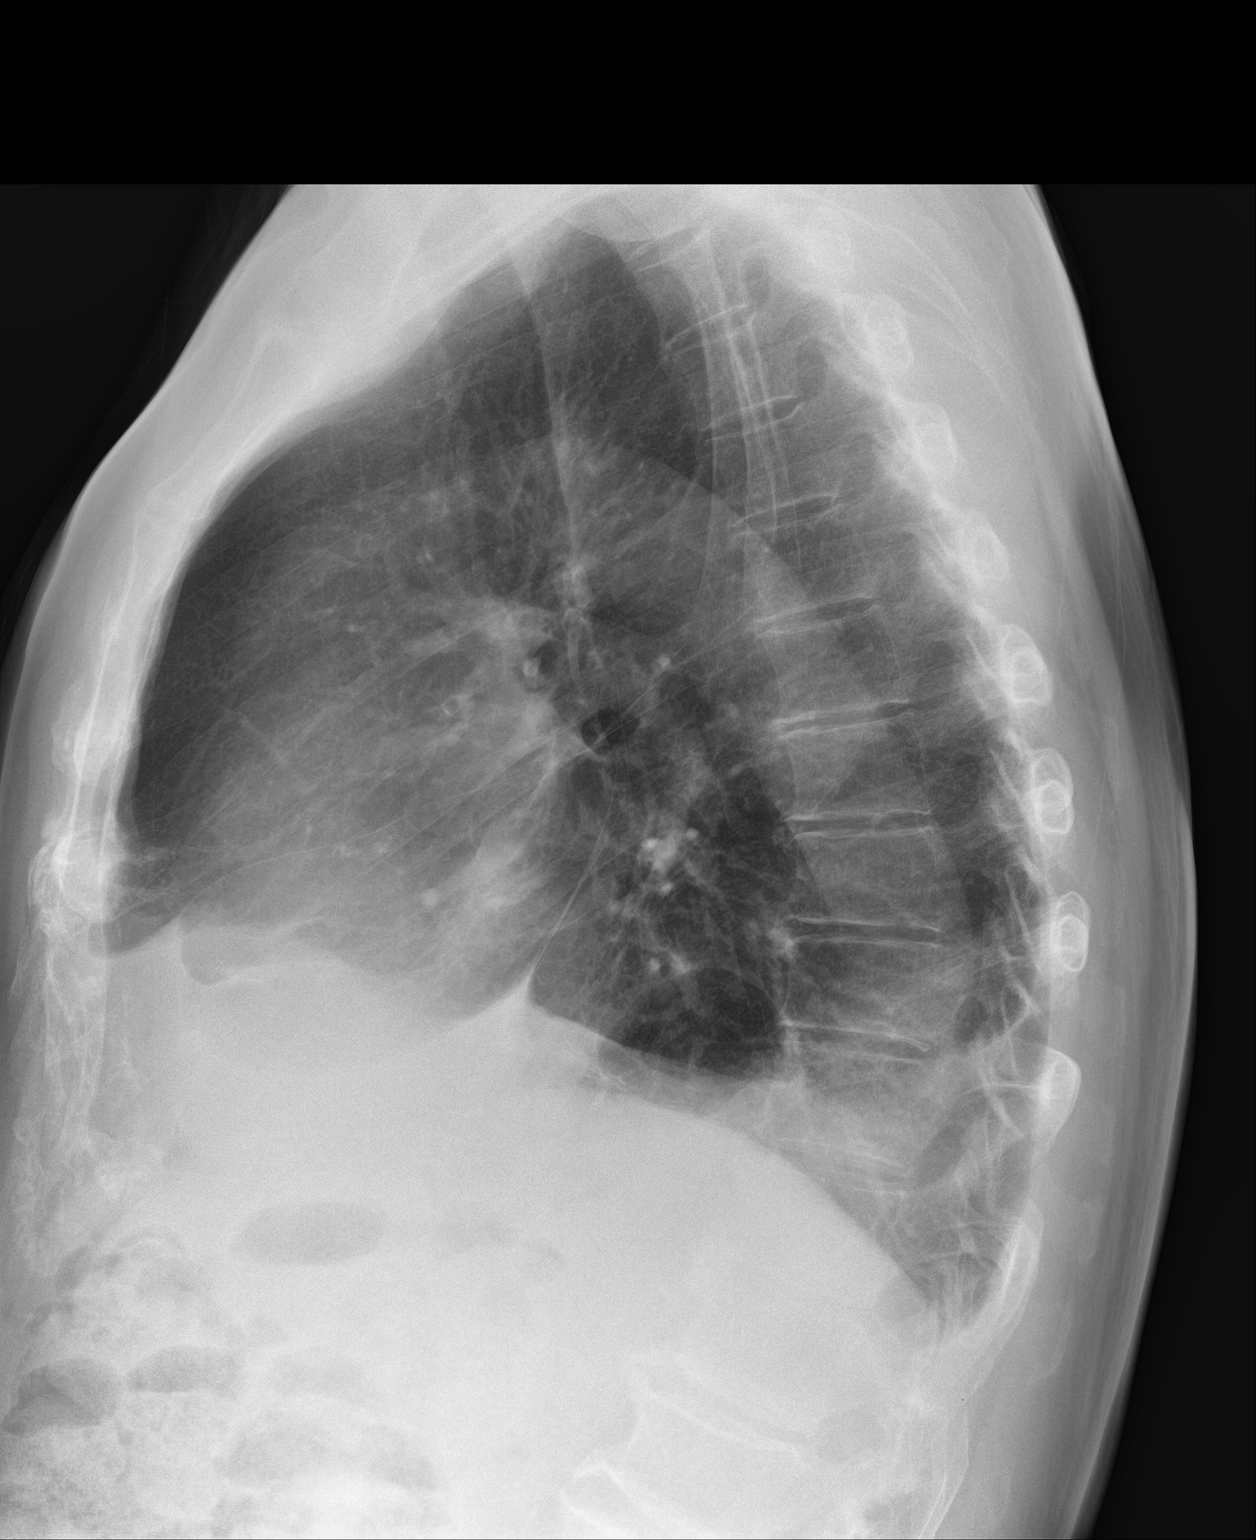

[2 of 2 positions shown; findings below may reference images not displayed]

FINDINGS: Mediastinum and hilar structures normal. Low lung volumes with mild
bibasilar atelectasis and/or scarring. Mild infiltrate right lung
base cannot be excluded. Tiny right pleural effusion cannot be
excluded. Cardiomegaly with normal pulmonary vascularity. Sliding
hiatal hernia. Degenerative changes thoracic spine.
IMPRESSION: 1. Mild bibasilar atelectasis and/or scarring. Mild infiltrate right
lung base cannot be excluded. Small right pleural effusion cannot be
excluded.
2. Cardiomegaly.  No pulmonary venous congestion.
3. Sliding hiatal hernia.

## 2018-01-06 DIAGNOSIS — H53411 Scotoma involving central area, right eye: Secondary | ICD-10-CM | POA: Diagnosis not present

## 2018-02-04 DIAGNOSIS — L578 Other skin changes due to chronic exposure to nonionizing radiation: Secondary | ICD-10-CM | POA: Diagnosis not present

## 2018-02-04 DIAGNOSIS — Z85828 Personal history of other malignant neoplasm of skin: Secondary | ICD-10-CM | POA: Diagnosis not present

## 2018-02-04 DIAGNOSIS — L570 Actinic keratosis: Secondary | ICD-10-CM | POA: Diagnosis not present

## 2018-02-04 DIAGNOSIS — L821 Other seborrheic keratosis: Secondary | ICD-10-CM | POA: Diagnosis not present

## 2018-02-04 DIAGNOSIS — Z1283 Encounter for screening for malignant neoplasm of skin: Secondary | ICD-10-CM | POA: Diagnosis not present

## 2018-02-04 DIAGNOSIS — Z86018 Personal history of other benign neoplasm: Secondary | ICD-10-CM | POA: Diagnosis not present

## 2018-04-15 DIAGNOSIS — R0789 Other chest pain: Secondary | ICD-10-CM | POA: Diagnosis not present

## 2018-04-15 DIAGNOSIS — R42 Dizziness and giddiness: Secondary | ICD-10-CM | POA: Diagnosis not present

## 2018-04-27 ENCOUNTER — Ambulatory Visit (INDEPENDENT_AMBULATORY_CARE_PROVIDER_SITE_OTHER): Payer: Medicare Other | Admitting: Family Medicine

## 2018-04-27 VITALS — BP 130/80 | HR 60 | Temp 97.9°F | Resp 14 | Ht 75.0 in | Wt 197.0 lb

## 2018-04-27 DIAGNOSIS — E785 Hyperlipidemia, unspecified: Secondary | ICD-10-CM | POA: Diagnosis not present

## 2018-04-27 DIAGNOSIS — Z1211 Encounter for screening for malignant neoplasm of colon: Secondary | ICD-10-CM

## 2018-04-27 DIAGNOSIS — I10 Essential (primary) hypertension: Secondary | ICD-10-CM

## 2018-04-27 DIAGNOSIS — Z23 Encounter for immunization: Secondary | ICD-10-CM

## 2018-04-27 LAB — IFOBT (OCCULT BLOOD): IMMUNOLOGICAL FECAL OCCULT BLOOD TEST: NEGATIVE

## 2018-04-27 NOTE — Progress Notes (Signed)
Zachary [name]  MRN: [id number] DOB: February 27, 1941  Subjective:  HPI   The patient is a 77 year old male who presents for follow up of his Annual Medicare Wellness exam that was done in June 2019. He is feeling well and remaining active.   Patient Active Problem List   Diagnosis Date Noted  . Pneumothorax on right 05/15/2015  . Benign essential tremor 02/28/2015  . HLD (hyperlipidemia) 02/28/2015  . BP (high blood pressure) 02/28/2015  . Hypercholesterolemia without hypertriglyceridemia 02/28/2015  . Head revolving around 02/28/2015    Past Medical History:  Diagnosis Date  . Hypercholesterolemia   . Hypertension     Social History   Socioeconomic History  . Marital status: Married    Spouse name: Not on file  . Number of children: 0  . Years of education: Not on file  . Highest education level: 12th grade  Occupational History  . Occupation: retired  Scientific laboratory technician  . Financial resource strain: Not hard at all  . Food insecurity:    Worry: Never true    Inability: Never true  . Transportation needs:    Medical: No    Non-medical: No  Tobacco Use  . Smoking status: Never Smoker  . Smokeless tobacco: Never Used  Substance and Sexual Activity  . Alcohol use: No    Alcohol/week: 0.0 standard drinks  . Drug use: No  . Sexual activity: Not on file  Lifestyle  . Physical activity:    Days per week: Not on file    Minutes per session: Not on file  . Stress: Not at all  Relationships  . Social connections:    Talks on phone: Not on file    Gets together: Not on file    Attends religious service: Not on file    Active member of club or organization: Not on file    Attends meetings of clubs or organizations: Not on file    Relationship status: Not on file  . Intimate partner violence:    Fear of current or ex partner: Not on file    Emotionally abused: Not on file    Physically abused: Not on file    Forced sexual activity: Not on file  Other Topics  Concern  . Not on file  Social History Narrative  . Not on file    Outpatient Encounter Medications as of 04/27/2018  Medication Sig  . aspirin EC 81 MG tablet Take 81 mg by mouth at bedtime.  Marland Kitchen atorvastatin (LIPITOR) 20 MG tablet TAKE ONE TABLET BY MOUTH AT BEDTIME  . lisinopril (PRINIVIL,ZESTRIL) 40 MG tablet TAKE ONE TABLET BY MOUTH ONCE DAILY  . meclizine (ANTIVERT) 25 MG tablet Take 1 tablet (25 mg total) by mouth 3 (three) times daily as needed for dizziness.  . [DISCONTINUED] HYDROcodone-homatropine (HYCODAN) 5-1.5 MG/5ML syrup 5 ml 4-6 hours as needed for cough   No facility-administered encounter medications on file as of 04/27/2018.     No Known Allergies  Review of Systems  Constitutional: Negative.   HENT: Negative.   Eyes: Negative.   Respiratory: Negative.   Cardiovascular: Negative.   Gastrointestinal: Negative.   Genitourinary: Negative.   Musculoskeletal: Negative.   Skin: Negative.   Neurological: Negative.   Endo/Heme/Allergies: Negative.   Psychiatric/Behavioral: Negative.     Objective:  BP 130/80 (BP Location: Right Arm, Patient Position: Sitting, Cuff Size: Normal)   Pulse 60   Temp 97.9 F (36.6 C) (Oral)   Resp 14  Ht 6\' 3"  (1.905 m)   Wt 197 lb (89.4 kg)   SpO2 98%   BMI 24.62 kg/m   Physical Exam  Constitutional: He is oriented to person, place, and time and well-developed, well-nourished, and in no distress.  HENT:  Head: Normocephalic and atraumatic.  Right Ear: External ear normal.  Left Ear: External ear normal.  Nose: Nose normal.  Mouth/Throat: Oropharynx is clear and moist.  Eyes: Pupils are equal, round, and reactive to light. Conjunctivae and EOM are normal.  Neck: Normal range of motion. Neck supple.  Cardiovascular: Normal rate, regular rhythm, normal heart sounds and intact distal pulses.  Pulmonary/Chest: Effort normal and breath sounds normal.  Abdominal: Soft. Bowel sounds are normal.  Genitourinary: Rectum normal,  prostate normal and penis normal. Rectal exam shows guaiac negative stool.  Musculoskeletal: Normal range of motion.  Neurological: He is alert and oriented to person, place, and time. He has normal reflexes. Gait normal. GCS score is 15.  Skin: Skin is warm and dry.  Psychiatric: Mood, memory, affect and judgment normal.    Assessment and Plan :   1. Essential hypertension Controlled - CBC with Differential/Platelet - Comprehensive metabolic panel - TSH  2. Hyperlipidemia, unspecified hyperlipidemia type Stable - Lipid Panel With LDL/HDL Ratio  3. Need for influenza vaccination  - Flu vaccine HIGH DOSE PF (Fluzone High dose)  4. Colon cancer screening Colonoscopy 2022.- IFOBT POC (occult bld, rslt in office); Future - IFOBT POC (occult bld, rslt in office)  HPI, Exam and A&P Transcribed under the direction and in the presence of Miguel Aschoff, Brooke Bonito., MD. Electronically Signed: Althea Charon, RMA I have done the exam and reviewed the above chart and it is accurate to the best of my knowledge. Development worker, community has been used in this note in any air is in the dictation or transcription are unintentional.

## 2018-04-28 LAB — LIPID PANEL WITH LDL/HDL RATIO
CHOLESTEROL TOTAL: 204 mg/dL — AB (ref 100–199)
HDL: 80 mg/dL (ref >39)
LDL Calculated: 110 mg/dL — ABNORMAL HIGH (ref 0–99)
LDl/HDL Ratio: 1.4 ratio (ref 0.0–3.6)
TRIGLYCERIDES: 69 mg/dL (ref 0–149)
VLDL Cholesterol Cal: 14 mg/dL (ref 5–40)

## 2018-04-28 LAB — CBC WITH DIFFERENTIAL/PLATELET
Basophils Absolute: 0.1 10*3/uL (ref 0.0–0.2)
Basos: 1 %
EOS (ABSOLUTE): 0.1 10*3/uL (ref 0.0–0.4)
EOS: 3 %
HEMATOCRIT: 42.2 % (ref 37.5–51.0)
Hemoglobin: 14.1 g/dL (ref 13.0–17.7)
Immature Grans (Abs): 0 10*3/uL (ref 0.0–0.1)
Immature Granulocytes: 1 %
LYMPHS ABS: 0.8 10*3/uL (ref 0.7–3.1)
Lymphs: 17 %
MCH: 32 pg (ref 26.6–33.0)
MCHC: 33.4 g/dL (ref 31.5–35.7)
MCV: 96 fL (ref 79–97)
MONOS ABS: 0.4 10*3/uL (ref 0.1–0.9)
Monocytes: 8 %
Neutrophils Absolute: 3.1 10*3/uL (ref 1.4–7.0)
Neutrophils: 70 %
Platelets: 220 10*3/uL (ref 150–450)
RBC: 4.4 x10E6/uL (ref 4.14–5.80)
RDW: 12.1 % — AB (ref 12.3–15.4)
WBC: 4.4 10*3/uL (ref 3.4–10.8)

## 2018-04-28 LAB — COMPREHENSIVE METABOLIC PANEL
A/G RATIO: 2 (ref 1.2–2.2)
ALK PHOS: 67 IU/L (ref 39–117)
ALT: 14 IU/L (ref 0–44)
AST: 24 IU/L (ref 0–40)
Albumin: 4.4 g/dL (ref 3.5–4.8)
BILIRUBIN TOTAL: 0.7 mg/dL (ref 0.0–1.2)
BUN/Creatinine Ratio: 13 (ref 10–24)
BUN: 16 mg/dL (ref 8–27)
CHLORIDE: 104 mmol/L (ref 96–106)
CO2: 22 mmol/L (ref 20–29)
Calcium: 9.5 mg/dL (ref 8.6–10.2)
Creatinine, Ser: 1.26 mg/dL (ref 0.76–1.27)
GFR calc Af Amer: 64 mL/min/{1.73_m2} (ref >59)
GFR calc non Af Amer: 55 mL/min/{1.73_m2} — ABNORMAL LOW (ref >59)
GLOBULIN, TOTAL: 2.2 g/dL (ref 1.5–4.5)
Glucose: 85 mg/dL (ref 65–99)
POTASSIUM: 4.2 mmol/L (ref 3.5–5.2)
Sodium: 140 mmol/L (ref 134–144)
Total Protein: 6.6 g/dL (ref 6.0–8.5)

## 2018-04-28 LAB — TSH: TSH: 4.93 u[IU]/mL — ABNORMAL HIGH (ref 0.450–4.500)

## 2018-05-11 ENCOUNTER — Other Ambulatory Visit: Payer: Self-pay | Admitting: Family Medicine

## 2018-06-17 IMAGING — MR MR HEAD WO/W CM
12 series · 48 of 48 positions shown · IV contrast (multihance)
Comparison: None.

CLINICAL DATA: Vision changes.  No headaches or dizziness.

EXAM:
MRI HEAD WITHOUT AND WITH CONTRAST
TECHNIQUE: Multiplanar, multiecho pulse sequences of the brain and surrounding
structures were obtained without and with intravenous contrast.
CONTRAST:  19mL MULTIHANCE GADOBENATE DIMEGLUMINE 529 MG/ML IV SOLN

[Series 2: T1 · sagittal · 5.0mm · 0.45mm/px · 1 of 25 slices shown (1 of 2)]
[im 1/25]
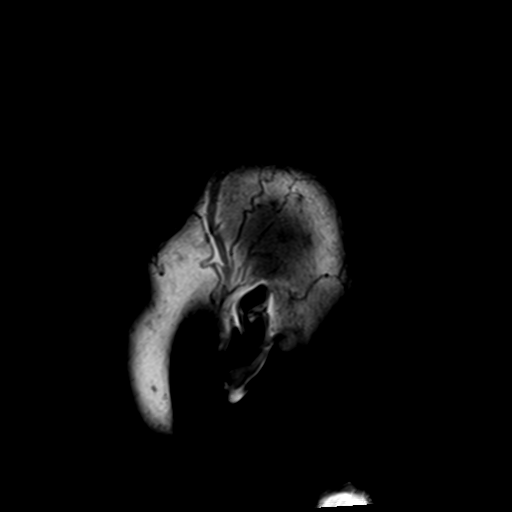

[Series 4: DWI · axial · 3.0mm · 1.20mm/px · z∈[-43,+119]mm · 5 of 56 slices shown (1 of 4)]
[im 1/56]
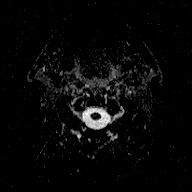
[im 14/56]
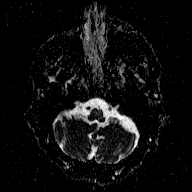
[im 28/56]
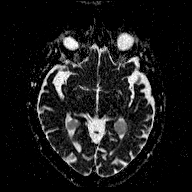
[im 42/56]
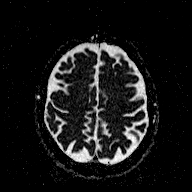
[im 56/56]
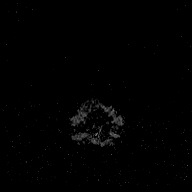

[Series 6: DWI · coronal · 3.0mm · 1.20mm/px · 5 of 47 slices shown (2 of 4)]
[im 1/47]
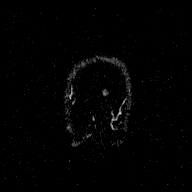
[im 12/47]
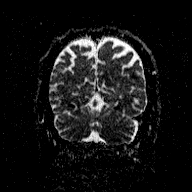
[im 24/47]
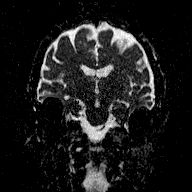
[im 35/47]
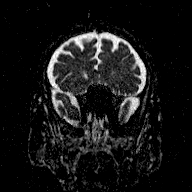
[im 47/47]
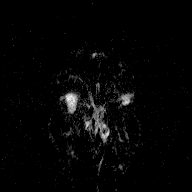

[Series 7: T2 · axial · 5.0mm · 0.72mm/px · z∈[-42,+118]mm · 3 of 26 slices shown (1 of 2)]
[im 1/26]
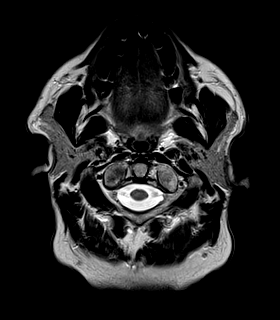
[im 13/26]
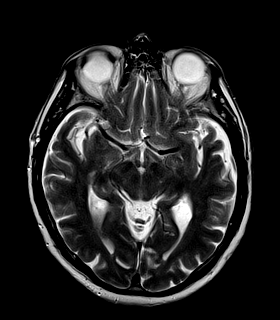
[im 26/26]
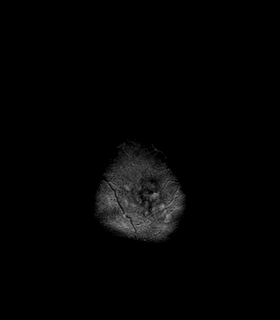

[Series 8: FLAIR · axial · 5.0mm · 0.45mm/px · z∈[-42,+118]mm · 3 of 26 slices shown]
[im 1/26]
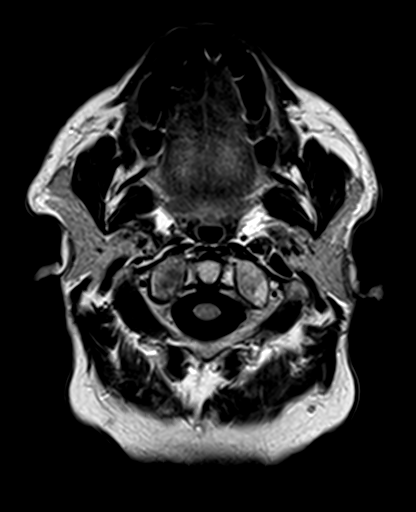
[im 13/26]
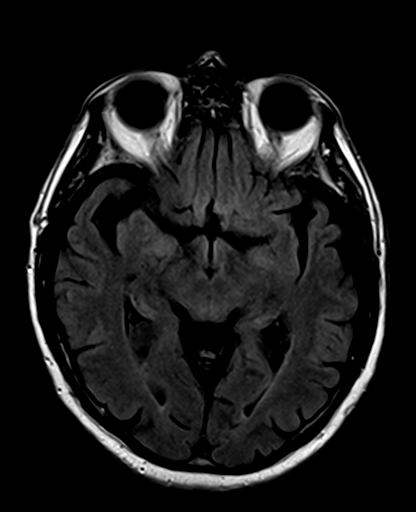
[im 26/26]
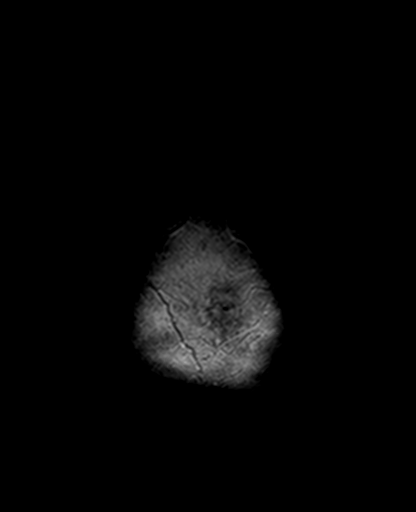

[Series 9: T2 · axial · 5.0mm · 0.72mm/px · z∈[-42,+118]mm · 3 of 26 slices shown (2 of 2)]
[im 1/26]
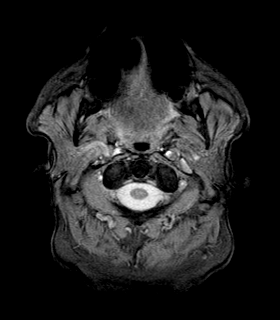
[im 13/26]
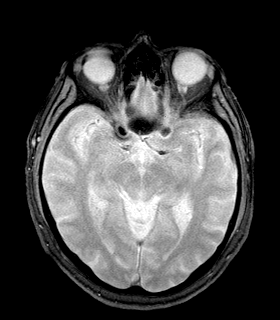
[im 26/26]
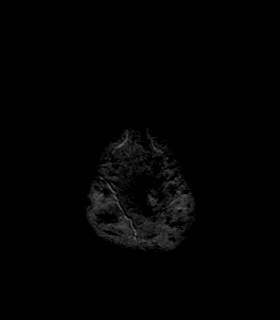

[Series 10: T1 · axial · 3.0mm · 1.00mm/px · z∈[-52,+133]mm · 6 of 64 slices shown (2 of 2)]
[im 1/64]
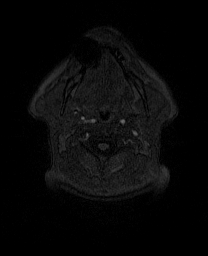
[im 13/64]
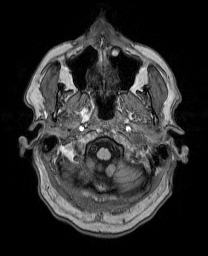
[im 26/64]
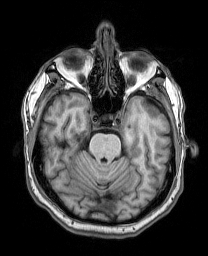
[im 38/64]
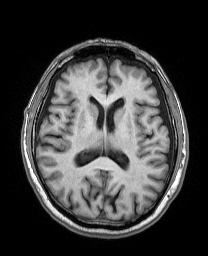
[im 51/64]
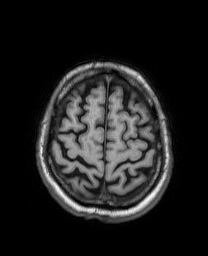
[im 64/64]
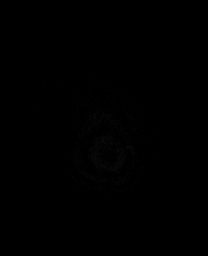

[Series 11: T2 post-contrast · coronal · 5.0mm · 0.45mm/px · 3 of 29 slices shown]
[im 1/29]
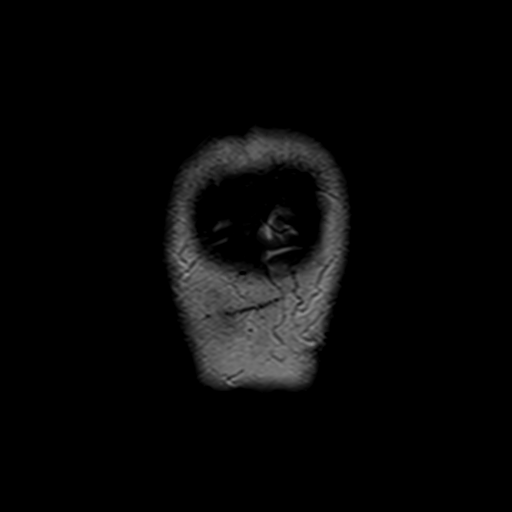
[im 15/29]
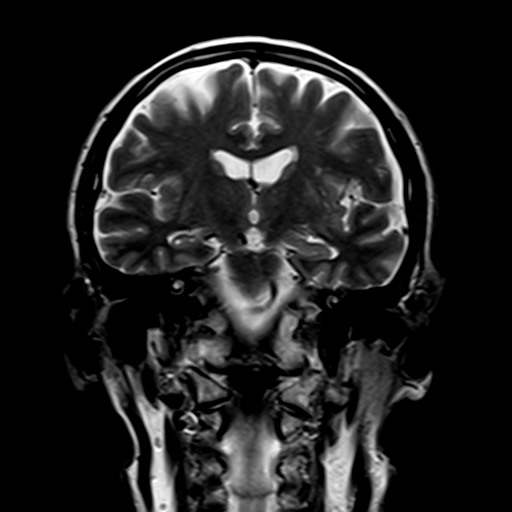
[im 29/29]
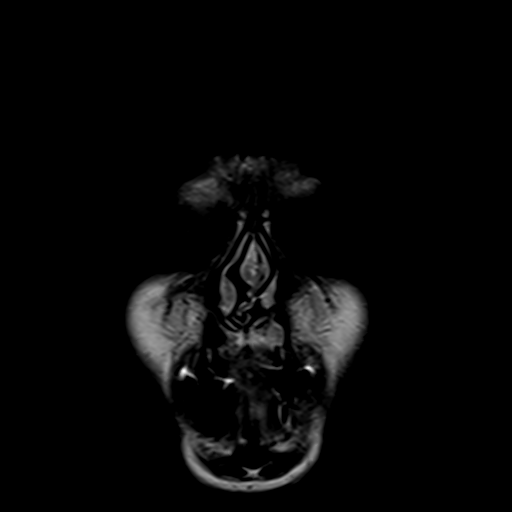

[Series 12: T1 post-contrast · axial · 3.0mm · 1.00mm/px · z∈[-52,+133]mm · 6 of 64 slices shown (1 of 2)]
[im 1/64]
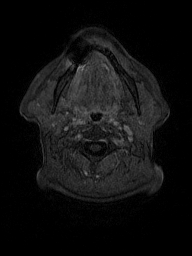
[im 13/64]
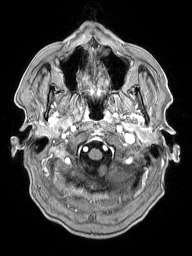
[im 26/64]
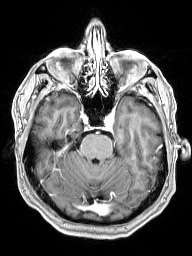
[im 38/64]
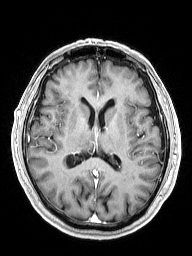
[im 51/64]
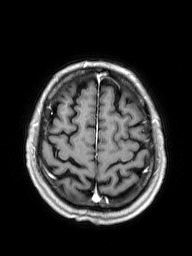
[im 64/64]
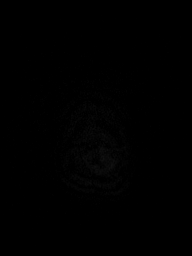

[Series 13: T1 post-contrast · coronal · 5.0mm · 0.45mm/px · 3 of 29 slices shown (2 of 2)]
[im 1/29]
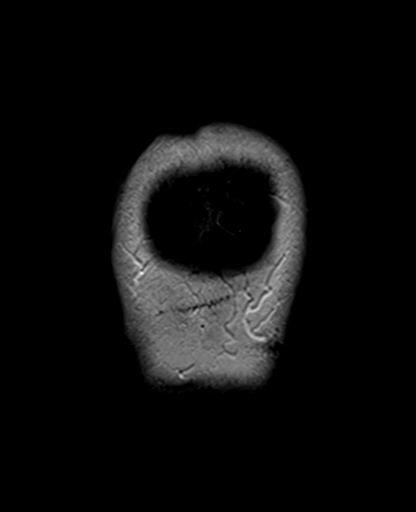
[im 15/29]
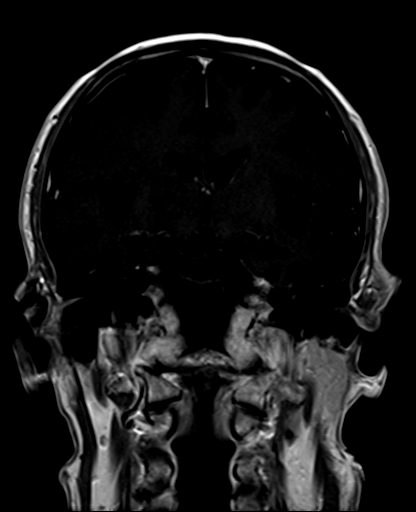
[im 29/29]
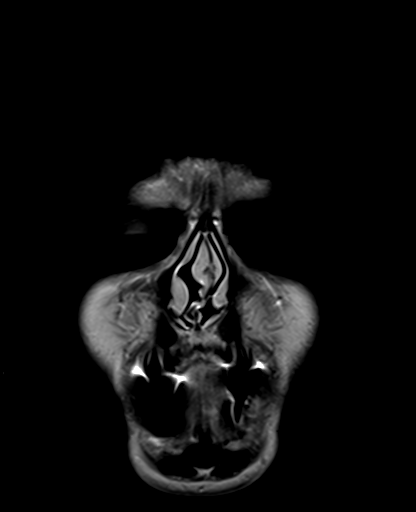

[Series 100: DWI · axial · 3.0mm · 1.20mm/px · z∈[-43,+119]mm · 5 of 56 slices shown (3 of 4)]
[im 1/56]
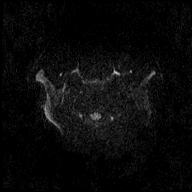
[im 14/56]
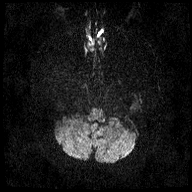
[im 28/56]
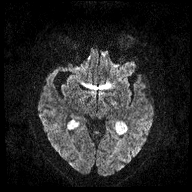
[im 42/56]
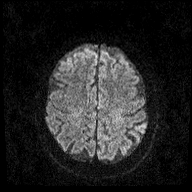
[im 56/56]
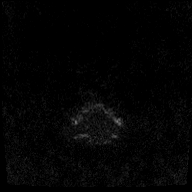

[Series 101: DWI · coronal · 3.0mm · 1.20mm/px · 5 of 47 slices shown (4 of 4)]
[im 1/47]
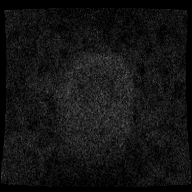
[im 12/47]
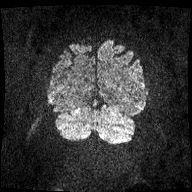
[im 24/47]
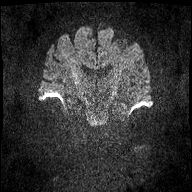
[im 35/47]
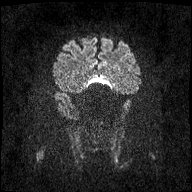
[im 47/47]
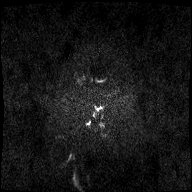

[48 of 48 positions shown; findings below may reference images not displayed]

FINDINGS: Brain Parenchyma: No acute infarct or intraparenchymal hemorrhage.
There are scattered foci of hyperintense T2 weighted signal within
the periventricular and subcortical white matter compatible with
chronic microvascular disease. No evidence of chronic
microhemorrhage or amyloid angiopathy. No mass lesion or midline
shift. The major intracranial flow voids are preserved. The midline
structures are normal.

Ventricles, Sulci and Extra-axial Spaces: No age advanced atrophy.
Bilateral choroid plexus xanthogranulomas are incidentally noted. No
extra-axial collection.

Paranasal Sinuses and Mastoids: Mild ethmoid sinus mucosal
thickening. No fluid levels. No mastoid or middle ear effusion.

Orbits: Normal.

Bones and Soft Tissues: The visualized skull base, calvarium and
extracranial soft tissues are normal.
IMPRESSION: 1. No acute intracranial abnormality. No findings to explain the
reported visual changes.
2. Mild findings of chronic microvascular disease.

## 2018-07-07 DIAGNOSIS — Z961 Presence of intraocular lens: Secondary | ICD-10-CM | POA: Diagnosis not present

## 2018-07-30 ENCOUNTER — Encounter: Payer: Self-pay | Admitting: Family Medicine

## 2018-07-30 ENCOUNTER — Ambulatory Visit (INDEPENDENT_AMBULATORY_CARE_PROVIDER_SITE_OTHER): Payer: Medicare Other | Admitting: Family Medicine

## 2018-07-30 VITALS — BP 114/74 | HR 53 | Temp 98.5°F | Resp 16 | Wt 204.6 lb

## 2018-07-30 DIAGNOSIS — J069 Acute upper respiratory infection, unspecified: Secondary | ICD-10-CM

## 2018-07-30 NOTE — Patient Instructions (Signed)
Discussed use of Mucinex D for congestion, Delsym for cough, and Benadryl for postnasal drainage. Let us know if new symptoms or sinuses not improving over 7 days.

## 2018-07-30 NOTE — Progress Notes (Signed)
  Subjective:     Patient ID: Zachary [name], male   DOB: 02/24/1941, 78 y.o.   MRN: [id number] Chief Complaint  Patient presents with  . Cough    Patient comes in office today with complaints of cough and scratchy throat for the past 3hrs.    HPI  States he is hoarse in addition to the above.Reports flu shot this season. States his wife is not ill.  Review of Systems     Objective:   Physical Exam Constitutional:      General: He is not in acute distress.    Appearance: He is not ill-appearing.  Neurological:     Mental Status: He is alert.   Ears: T.M's intact without inflammation Throat: no tonsillar enlargement or exudate Neck: no cervical adenopathy Lungs: clear     Assessment:    1. Viral upper respiratory tract infection     Plan:    Discussed use of Mucinex D for congestion, Delsym for cough, and Benadryl for postnasal drainage  He is to call if sinuses not improving over the next 7 days.

## 2018-08-02 ENCOUNTER — Other Ambulatory Visit: Payer: Self-pay

## 2018-08-02 ENCOUNTER — Encounter: Payer: Self-pay | Admitting: Gynecology

## 2018-08-02 ENCOUNTER — Ambulatory Visit
Admission: EM | Admit: 2018-08-02 | Discharge: 2018-08-02 | Disposition: A | Discharge status: Home / Self Care | Payer: Medicare Other | Attending: Family Medicine | Admitting: Family Medicine

## 2018-08-02 DIAGNOSIS — J069 Acute upper respiratory infection, unspecified: Secondary | ICD-10-CM | POA: Diagnosis not present

## 2018-08-02 DIAGNOSIS — B9789 Other viral agents as the cause of diseases classified elsewhere: Secondary | ICD-10-CM

## 2018-08-02 LAB — RAPID INFLUENZA A&B ANTIGENS: Influenza B (ARMC): NEGATIVE

## 2018-08-02 LAB — RAPID STREP SCREEN (MED CTR MEBANE ONLY): Streptococcus, Group A Screen (Direct): NEGATIVE

## 2018-08-02 LAB — RAPID INFLUENZA A&B ANTIGENS (ARMC ONLY): INFLUENZA A (ARMC): NEGATIVE

## 2018-08-02 MED ORDER — BENZONATATE 200 MG PO CAPS: 200.0000 mg | ORAL_CAPSULE | Freq: Three times a day (TID) | ORAL | 0 refills | Status: DC | PRN

## 2018-08-02 NOTE — ED Provider Notes (Signed)
[facility]    CSN: [id number] Arrival date & time: 08/02/18  1128     History   Chief Complaint No chief complaint on file.   HPI Zachary [name] is a 78 y.o. male.   The history is provided by the patient.  URI  Presenting symptoms: congestion, cough and rhinorrhea   Severity:  Moderate Onset quality:  Sudden Duration:  5 days Timing:  Constant Chronicity:  New Relieved by:  None tried Ineffective treatments:  None tried Associated symptoms: no wheezing   Risk factors: sick contacts     Past Medical History:  Diagnosis Date  . Hypercholesterolemia   . Hypertension     Patient Active Problem List   Diagnosis Date Noted  . Pneumothorax on right 05/15/2015  . Benign essential tremor 02/28/2015  . HLD (hyperlipidemia) 02/28/2015  . BP (high blood pressure) 02/28/2015  . Hypercholesterolemia without hypertriglyceridemia 02/28/2015  . Vertigo 02/28/2015    Past Surgical History:  Procedure Laterality Date  . APPENDECTOMY    . CATARACT EXTRACTION Bilateral   . PLEURAL SCARIFICATION  05/2015       Home Medications    Prior to Admission medications   Medication Sig Start Date End Date Taking? Authorizing Provider  aspirin EC 81 MG tablet Take 81 mg by mouth at bedtime.   Yes [provider]  atorvastatin (LIPITOR) 20 MG tablet TAKE ONE TABLET BY MOUTH AT BEDTIME 05/08/17  Yes Jerrol Banana., MD  lisinopril (PRINIVIL,ZESTRIL) 40 MG tablet TAKE 1 TABLET BY MOUTH ONCE DAILY 05/11/18  Yes Jerrol Banana., MD  meclizine (ANTIVERT) 25 MG tablet Take 1 tablet (25 mg total) by mouth 3 (three) times daily as needed for dizziness. 01/22/17  Yes Jerrol Banana., MD  benzonatate (TESSALON) 200 MG capsule Take 1 capsule (200 mg total) by mouth 3 (three) times daily as needed. 08/02/18   Norval Gable, MD    Family History Family History  Problem Relation Age of Onset  . Hypertension Mother   . Hyperlipidemia Mother   .  Heart attack Father   . Hypertension Father   . CVA Father   . ALS Brother   . Prostate cancer Brother     Social History Social History   Tobacco Use  . Smoking status: Never Smoker  . Smokeless tobacco: Never Used  Substance Use Topics  . Alcohol use: No    Alcohol/week: 0.0 standard drinks  . Drug use: No     Allergies   Patient has no known allergies.   Review of Systems Review of Systems  HENT: Positive for congestion and rhinorrhea.   Respiratory: Positive for cough. Negative for wheezing.      Physical Exam Triage Vital Signs ED Triage Vitals  Enc Vitals Group     BP 08/02/18 1215 (!) 124/91     Pulse Rate 08/02/18 1215 68     Resp 08/02/18 1215 16     Temp 08/02/18 1215 99.2 F (37.3 C)     Temp Source 08/02/18 1215 Oral     SpO2 08/02/18 1215 98 %     Weight 08/02/18 1218 202 lb (91.6 kg)     Height 08/02/18 1218 6\' 2"  (1.88 m)     Head Circumference --      Peak Flow --      Pain Score 08/02/18 1217 0     Pain Loc --      Pain Edu? --  Excl. in GC? --    No data found.  Updated Vital Signs BP (!) 124/91 (BP Location: Left Arm)   Pulse 68   Temp 99.2 F (37.3 C) (Oral)   Resp 16   Ht 6\' 2"  (1.88 m)   Wt 91.6 kg   SpO2 98%   BMI 25.94 kg/m   Visual Acuity Right Eye Distance:   Left Eye Distance:   Bilateral Distance:    Right Eye Near:   Left Eye Near:    Bilateral Near:     Physical Exam Vitals signs and nursing note reviewed.  Constitutional:      General: He is not in acute distress.    Appearance: He is well-developed. He is not toxic-appearing or diaphoretic.  HENT:     Head: Normocephalic and atraumatic.     Right Ear: Tympanic membrane, ear canal and external ear normal.     Left Ear: Tympanic membrane, ear canal and external ear normal.     Nose: Congestion and rhinorrhea present.     Mouth/Throat:     Pharynx: Uvula midline. No oropharyngeal exudate or posterior oropharyngeal erythema.     Tonsils: No tonsillar  abscesses.  Eyes:     General: No scleral icterus.       Right eye: No discharge.        Left eye: No discharge.  Neck:     Musculoskeletal: Normal range of motion and neck supple.     Thyroid: No thyromegaly.     Trachea: No tracheal deviation.  Cardiovascular:     Rate and Rhythm: Normal rate and regular rhythm.     Heart sounds: Normal heart sounds.  Pulmonary:     Effort: Pulmonary effort is normal. No respiratory distress.     Breath sounds: Normal breath sounds. No stridor. No wheezing, rhonchi or rales.  Chest:     Chest wall: No tenderness.  Lymphadenopathy:     Cervical: No cervical adenopathy.  Skin:    General: Skin is warm and dry.     Findings: No rash.  Neurological:     Mental Status: He is alert.      UC Treatments / Results  Labs (all labs ordered are listed, but only abnormal results are displayed) Labs Reviewed  RAPID INFLUENZA A&B ANTIGENS ([facility] ONLY)  RAPID STREP SCREEN ([facility] ONLY)  CULTURE, GROUP A STREP [facility])    EKG None  Radiology No results found.  Procedures Procedures (including critical care time)  Medications Ordered in UC Medications - No data to display  Initial Impression / Assessment and Plan / UC Course  I have reviewed the triage vital signs and the nursing notes.  Pertinent labs & imaging results that were available during my care of the patient were reviewed by me and considered in my medical decision making (see chart for details).      Final Clinical Impressions(s) / UC Diagnoses   Final diagnoses:  Viral URI with cough     Discharge Instructions     Rest, fluids    ED Prescriptions    Medication Sig Dispense Auth. Provider   benzonatate (TESSALON) 200 MG capsule Take 1 capsule (200 mg total) by mouth 3 (three) times daily as needed. 30 capsule Norval Gable, MD      1. Lab results and diagnosis reviewed with patient 2. rx as per orders above; reviewed possible side effects, interactions,  risks and benefits  3. Recommend supportive treatment as above 4. Follow-up prn  if symptoms worsen or don't improve  Controlled Substance Prescriptions [place] Controlled Substance Registry consulted? Not Applicable   Norval Gable, MD 08/02/18 1335

## 2018-08-02 NOTE — ED Triage Notes (Signed)
Patient c/o cold symptoms x 5 days. Pt.stated with cough and low energy.

## 2018-08-02 NOTE — Discharge Instructions (Signed)
Rest, fluids. 

## 2018-08-05 LAB — CULTURE, GROUP A STREP (THRC)

## 2018-08-07 ENCOUNTER — Other Ambulatory Visit: Payer: Self-pay | Admitting: Family Medicine

## 2018-10-07 DIAGNOSIS — R42 Dizziness and giddiness: Secondary | ICD-10-CM | POA: Diagnosis not present

## 2018-10-07 DIAGNOSIS — E782 Mixed hyperlipidemia: Secondary | ICD-10-CM | POA: Diagnosis not present

## 2018-10-07 DIAGNOSIS — I10 Essential (primary) hypertension: Secondary | ICD-10-CM | POA: Diagnosis not present

## 2018-11-04 ENCOUNTER — Other Ambulatory Visit: Payer: Self-pay

## 2018-11-04 ENCOUNTER — Encounter: Payer: Self-pay | Admitting: Family Medicine

## 2018-11-04 ENCOUNTER — Ambulatory Visit (INDEPENDENT_AMBULATORY_CARE_PROVIDER_SITE_OTHER): Payer: Medicare Other | Admitting: Family Medicine

## 2018-11-04 VITALS — BP 120/72 | HR 54 | Temp 98.1°F | Resp 16 | Ht 74.5 in | Wt 188.0 lb

## 2018-11-04 DIAGNOSIS — I10 Essential (primary) hypertension: Secondary | ICD-10-CM | POA: Diagnosis not present

## 2018-11-04 DIAGNOSIS — R634 Abnormal weight loss: Secondary | ICD-10-CM | POA: Diagnosis not present

## 2018-11-04 DIAGNOSIS — E785 Hyperlipidemia, unspecified: Secondary | ICD-10-CM | POA: Diagnosis not present

## 2018-11-04 NOTE — Progress Notes (Signed)
Patient: Zachary [name] Male    DOB: 1940/07/17   78 y.o.   MRN: [id number] Visit Date: 11/04/2018  Today's Provider: [name], MD   Chief Complaint  Patient presents with  . Hypertension  . Hyperlipidemia   Subjective:     HPI  Hypertension, follow-up:  BP Readings from Last 3 Encounters:  11/04/18 120/72  08/02/18 (!) 124/91  07/30/18 114/74    He was last seen for hypertension 6 months ago.  BP at that visit was 130/80. Management since that visit includes no changes. He reports good compliance with treatment. He is not having side effects.  He is not exercising. He is adherent to low salt diet.   Outside blood pressures are not being checked. He is experiencing none.  Patient denies exertional chest pressure/discomfort, lower extremity edema and palpitations.   Cardiovascular risk factors include dyslipidemia.   Weight trend: stable Wt Readings from Last 3 Encounters:  11/04/18 188 lb (85.3 kg)  08/02/18 202 lb (91.6 kg)  07/30/18 204 lb 9.6 oz (92.8 kg)    Current diet: well balanced    Lipid/Cholesterol, Follow-up:   Last seen for this6 months ago.  Management changes since that visit include no changes. . Last Lipid Panel:    Component Value Date/Time   CHOL 204 (H) 04/27/2018 0930   TRIG 69 04/27/2018 0930   HDL 80 04/27/2018 0930   CHOLHDL 2.6 05/22/2017 1005   LDLCALC 110 (H) 04/27/2018 0930   LDLCALC 92 05/22/2017 1005    Risk factors for vascular disease include hypertension  He reports good compliance with treatment. He is not having side effects.  Current symptoms include none and have been stable.  No Known Allergies   Current Outpatient Medications:  .  aspirin EC 81 MG tablet, Take 81 mg by mouth at bedtime., Disp: , Rfl:  .  atorvastatin (LIPITOR) 20 MG tablet, TAKE 1 TABLET BY MOUTH AT BEDTIME, Disp: 90 tablet, Rfl: 3 .  lisinopril (PRINIVIL,ZESTRIL) 40 MG tablet, TAKE 1 TABLET BY MOUTH ONCE DAILY,  Disp: 90 tablet, Rfl: 3 .  meclizine (ANTIVERT) 25 MG tablet, Take 1 tablet (25 mg total) by mouth 3 (three) times daily as needed for dizziness., Disp: 30 tablet, Rfl: 0 .  benzonatate (TESSALON) 200 MG capsule, Take 1 capsule (200 mg total) by mouth 3 (three) times daily as needed. (Patient not taking: Reported on 11/04/2018), Disp: 30 capsule, Rfl: 0  Review of Systems  Constitutional: Negative for activity change, appetite change, chills, diaphoresis, fatigue, fever and unexpected weight change.  Respiratory: Negative for cough and shortness of breath.   Cardiovascular: Negative for chest pain, palpitations and leg swelling.  Musculoskeletal: Negative for arthralgias and joint swelling.  Psychiatric/Behavioral: Negative for self-injury, sleep disturbance and suicidal ideas. The patient is not nervous/anxious.     Social History   Tobacco Use  . Smoking status: Never Smoker  . Smokeless tobacco: Never Used  Substance Use Topics  . Alcohol use: No    Alcohol/week: 0.0 standard drinks      Objective:   BP 120/72 (BP Location: Left Arm, Patient Position: Sitting, Cuff Size: Normal)   Pulse (!) 54   Temp 98.1 F (36.7 C)   Resp 16   Ht 6' 2.5" (1.892 m)   Wt 188 lb (85.3 kg)   SpO2 98%   BMI 23.81 kg/m  Vitals:   11/04/18 0929  BP: 120/72  Pulse: (!) 54  Resp: 16  Temp: 98.1 F (36.7 C)  SpO2: 98%  Weight: 188 lb (85.3 kg)  Height: 6' 2.5" (1.892 m)     Physical Exam Vitals signs reviewed.  Constitutional:      Appearance: He is well-developed.  HENT:     Head: Normocephalic and atraumatic.  Eyes:     General: No scleral icterus.    Conjunctiva/sclera: Conjunctivae normal.  Neck:     Thyroid: No thyromegaly.  Cardiovascular:     Rate and Rhythm: Normal rate and regular rhythm.     Heart sounds: Normal heart sounds.  Pulmonary:     Effort: Pulmonary effort is normal.     Breath sounds: Normal breath sounds.  Abdominal:     Palpations: Abdomen is soft.   Lymphadenopathy:     Cervical: No cervical adenopathy.  Skin:    General: Skin is warm and dry.  Neurological:     General: No focal deficit present.     Mental Status: He is alert and oriented to person, place, and time. Mental status is at baseline.  Psychiatric:        Mood and Affect: Mood normal.        Behavior: Behavior normal.        Thought Content: Thought content normal.        Judgment: Judgment normal.         Assessment & Plan    1. Essential hypertension Controlled with Lisinopril. - Comprehensive metabolic panel  2. Hyperlipidemia, unspecified hyperlipidemia type On Atorvaststin. - Lipid panel  3. Weight loss Pt feels well but unexplained weight loss but feels well and has no complaints. Will recheck 2-3 months. - CBC with Differential/Platelet - TSH     Jaydah Stahle Cranford Mon, MD  [facility]

## 2018-11-05 ENCOUNTER — Telehealth: Payer: Self-pay

## 2018-11-05 LAB — CBC WITH DIFFERENTIAL/PLATELET
Basophils Absolute: 0.1 10*3/uL (ref 0.0–0.2)
Basos: 1 %
EOS (ABSOLUTE): 0.2 10*3/uL (ref 0.0–0.4)
Eos: 5 %
Hematocrit: 38.1 % (ref 37.5–51.0)
Hemoglobin: 13 g/dL (ref 13.0–17.7)
Immature Grans (Abs): 0 10*3/uL (ref 0.0–0.1)
Immature Granulocytes: 1 %
Lymphocytes Absolute: 0.8 10*3/uL (ref 0.7–3.1)
Lymphs: 19 %
MCH: 33.6 pg — ABNORMAL HIGH (ref 26.6–33.0)
MCHC: 34.1 g/dL (ref 31.5–35.7)
MCV: 98 fL — ABNORMAL HIGH (ref 79–97)
Monocytes Absolute: 0.4 10*3/uL (ref 0.1–0.9)
Monocytes: 10 %
Neutrophils Absolute: 2.6 10*3/uL (ref 1.4–7.0)
Neutrophils: 64 %
Platelets: 208 10*3/uL (ref 150–450)
RBC: 3.87 x10E6/uL — ABNORMAL LOW (ref 4.14–5.80)
RDW: 12.5 % (ref 11.6–15.4)
WBC: 4 10*3/uL (ref 3.4–10.8)

## 2018-11-05 LAB — COMPREHENSIVE METABOLIC PANEL
ALT: 12 IU/L (ref 0–44)
AST: 23 IU/L (ref 0–40)
Albumin/Globulin Ratio: 2.2 (ref 1.2–2.2)
Albumin: 4.1 g/dL (ref 3.7–4.7)
Alkaline Phosphatase: 71 IU/L (ref 39–117)
BUN/Creatinine Ratio: 14 (ref 10–24)
BUN: 17 mg/dL (ref 8–27)
Bilirubin Total: 0.7 mg/dL (ref 0.0–1.2)
CO2: 21 mmol/L (ref 20–29)
Calcium: 9.2 mg/dL (ref 8.6–10.2)
Chloride: 108 mmol/L — ABNORMAL HIGH (ref 96–106)
Creatinine, Ser: 1.25 mg/dL (ref 0.76–1.27)
GFR calc Af Amer: 65 mL/min/{1.73_m2} (ref >59)
GFR calc non Af Amer: 56 mL/min/{1.73_m2} — ABNORMAL LOW (ref >59)
Globulin, Total: 1.9 g/dL (ref 1.5–4.5)
Glucose: 84 mg/dL (ref 65–99)
Potassium: 4.9 mmol/L (ref 3.5–5.2)
Sodium: 143 mmol/L (ref 134–144)
Total Protein: 6 g/dL (ref 6.0–8.5)

## 2018-11-05 LAB — LIPID PANEL
Chol/HDL Ratio: 2.6 ratio (ref 0.0–5.0)
Cholesterol, Total: 180 mg/dL (ref 100–199)
HDL: 70 mg/dL (ref >39)
LDL Calculated: 97 mg/dL (ref 0–99)
Triglycerides: 64 mg/dL (ref 0–149)
VLDL Cholesterol Cal: 13 mg/dL (ref 5–40)

## 2018-11-05 LAB — TSH: TSH: 3.79 u[IU]/mL (ref 0.450–4.500)

## 2018-11-05 NOTE — Telephone Encounter (Signed)
-----   Message from Jerrol Banana., MD sent at 11/05/2018  8:28 AM EDT ----- Labs stable.

## 2018-11-05 NOTE — Telephone Encounter (Signed)
Patient notified of lab results

## 2018-11-23 ENCOUNTER — Other Ambulatory Visit: Payer: Self-pay

## 2018-11-23 ENCOUNTER — Ambulatory Visit (INDEPENDENT_AMBULATORY_CARE_PROVIDER_SITE_OTHER): Payer: Medicare Other

## 2018-11-23 DIAGNOSIS — Z0000 Encounter for general adult medical examination without abnormal findings: Secondary | ICD-10-CM

## 2018-11-23 DIAGNOSIS — Z1211 Encounter for screening for malignant neoplasm of colon: Secondary | ICD-10-CM | POA: Diagnosis not present

## 2018-11-23 NOTE — Patient Instructions (Addendum)
Zachary [name] , Thank you for taking time to come for your Medicare Wellness Visit. I appreciate your ongoing commitment to your health goals. Please review the following plan we discussed and let me know if I can assist you in the future.   Screening recommendations/referrals: Colonoscopy: Referral to GI placed today. Pt aware the office will call re: appt. Recommended yearly ophthalmology/optometry visit for glaucoma screening and checkup Recommended yearly dental visit for hygiene and checkup  Vaccinations: Influenza vaccine: Up to date Pneumococcal vaccine: Completed series Tdap vaccine: Up to date, due 06/2024 Shingles vaccine: Pt declines today.     Advanced directives: Advance directive discussed with you today. Even though you declined this today please call our office should you change your mind and we can give you the proper paperwork for you to fill out.  Conditions/risks identified: Continue to increase water intake to 6-8 8 oz glasses a day.   Next appointment: 03/08/19 with Dr Rosanna Randy. Declined scheduling an AWV for 2021 at this time.   Preventive Care 88 Years and Older, Male Preventive care refers to lifestyle choices and visits with your health care provider that can promote health and wellness. What does preventive care include?  A yearly physical exam. This is also called an annual well check.  Dental exams once or twice a year.  Routine eye exams. Ask your health care provider how often you should have your eyes checked.  Personal lifestyle choices, including:  Daily care of your teeth and gums.  Regular physical activity.  Eating a healthy diet.  Avoiding tobacco and drug use.  Limiting alcohol use.  Practicing safe sex.  Taking low doses of aspirin every day.  Taking vitamin and mineral supplements as recommended by your health care provider. What happens during an annual well check? The services and screenings done by your health care provider  during your annual well check will depend on your age, overall health, lifestyle risk factors, and family history of disease. Counseling  Your health care provider may ask you questions about your:  Alcohol use.  Tobacco use.  Drug use.  Emotional well-being.  Home and relationship well-being.  Sexual activity.  Eating habits.  History of falls.  Memory and ability to understand (cognition).  Work and work Statistician. Screening  You may have the following tests or measurements:  Height, weight, and BMI.  Blood pressure.  Lipid and cholesterol levels. These may be checked every 5 years, or more frequently if you are over 15 years old.  Skin check.  Lung cancer screening. You may have this screening every year starting at age 44 if you have a 30-pack-year history of smoking and currently smoke or have quit within the past 15 years.  Fecal occult blood test (FOBT) of the stool. You may have this test every year starting at age 42.  Flexible sigmoidoscopy or colonoscopy. You may have a sigmoidoscopy every 5 years or a colonoscopy every 10 years starting at age 58.  Prostate cancer screening. Recommendations will vary depending on your family history and other risks.  Hepatitis C blood test.  Hepatitis B blood test.  Sexually transmitted disease (STD) testing.  Diabetes screening. This is done by checking your blood sugar (glucose) after you have not eaten for a while (fasting). You may have this done every 1-3 years.  Abdominal aortic aneurysm (AAA) screening. You may need this if you are a current or former smoker.  Osteoporosis. You may be screened starting at age 61 if you  are at high risk. Talk with your health care provider about your test results, treatment options, and if necessary, the need for more tests. Vaccines  Your health care provider may recommend certain vaccines, such as:  Influenza vaccine. This is recommended every year.  Tetanus,  diphtheria, and acellular pertussis (Tdap, Td) vaccine. You may need a Td booster every 10 years.  Zoster vaccine. You may need this after age 38.  Pneumococcal 13-valent conjugate (PCV13) vaccine. One dose is recommended after age 3.  Pneumococcal polysaccharide (PPSV23) vaccine. One dose is recommended after age 79. Talk to your health care provider about which screenings and vaccines you need and how often you need them. This information is not intended to replace advice given to you by your health care provider. Make sure you discuss any questions you have with your health care provider. Document Released: 06/16/2015 Document Revised: 02/07/2016 Document Reviewed: 03/21/2015 Elsevier Interactive Patient Education  2017 [place] Prevention in the Home Falls can cause injuries. They can happen to people of all ages. There are many things you can do to make your home safe and to help prevent falls. What can I do on the outside of my home?  Regularly fix the edges of walkways and driveways and fix any cracks.  Remove anything that might make you trip as you walk through a door, such as a raised step or threshold.  Trim any bushes or trees on the path to your home.  Use bright outdoor lighting.  Clear any walking paths of anything that might make someone trip, such as rocks or tools.  Regularly check to see if handrails are loose or broken. Make sure that both sides of any steps have handrails.  Any raised decks and porches should have guardrails on the edges.  Have any leaves, snow, or ice cleared regularly.  Use sand or salt on walking paths during winter.  Clean up any spills in your garage right away. This includes oil or grease spills. What can I do in the bathroom?  Use night lights.  Install grab bars by the toilet and in the tub and shower. Do not use towel bars as grab bars.  Use non-skid mats or decals in the tub or shower.  If you need to sit down in  the shower, use a plastic, non-slip stool.  Keep the floor dry. Clean up any water that spills on the floor as soon as it happens.  Remove soap buildup in the tub or shower regularly.  Attach bath mats securely with double-sided non-slip rug tape.  Do not have throw rugs and other things on the floor that can make you trip. What can I do in the bedroom?  Use night lights.  Make sure that you have a light by your bed that is easy to reach.  Do not use any sheets or blankets that are too big for your bed. They should not hang down onto the floor.  Have a firm chair that has side arms. You can use this for support while you get dressed.  Do not have throw rugs and other things on the floor that can make you trip. What can I do in the kitchen?  Clean up any spills right away.  Avoid walking on wet floors.  Keep items that you use a lot in easy-to-reach places.  If you need to reach something above you, use a strong step stool that has a grab bar.  Keep electrical cords out  of the way.  Do not use floor polish or wax that makes floors slippery. If you must use wax, use non-skid floor wax.  Do not have throw rugs and other things on the floor that can make you trip. What can I do with my stairs?  Do not leave any items on the stairs.  Make sure that there are handrails on both sides of the stairs and use them. Fix handrails that are broken or loose. Make sure that handrails are as long as the stairways.  Check any carpeting to make sure that it is firmly attached to the stairs. Fix any carpet that is loose or worn.  Avoid having throw rugs at the top or bottom of the stairs. If you do have throw rugs, attach them to the floor with carpet tape.  Make sure that you have a light switch at the top of the stairs and the bottom of the stairs. If you do not have them, ask someone to add them for you. What else can I do to help prevent falls?  Wear shoes that:  Do not have high  heels.  Have rubber bottoms.  Are comfortable and fit you well.  Are closed at the toe. Do not wear sandals.  If you use a stepladder:  Make sure that it is fully opened. Do not climb a closed stepladder.  Make sure that both sides of the stepladder are locked into place.  Ask someone to hold it for you, if possible.  Clearly mark and make sure that you can see:  Any grab bars or handrails.  First and last steps.  Where the edge of each step is.  Use tools that help you move around (mobility aids) if they are needed. These include:  Canes.  Walkers.  Scooters.  Crutches.  Turn on the lights when you go into a dark area. Replace any light bulbs as soon as they burn out.  Set up your furniture so you have a clear path. Avoid moving your furniture around.  If any of your floors are uneven, fix them.  If there are any pets around you, be aware of where they are.  Review your medicines with your doctor. Some medicines can make you feel dizzy. This can increase your chance of falling. Ask your doctor what other things that you can do to help prevent falls. This information is not intended to replace advice given to you by your health care provider. Make sure you discuss any questions you have with your health care provider. Document Released: 03/16/2009 Document Revised: 10/26/2015 Document Reviewed: 06/24/2014 Elsevier Interactive Patient Education  2017 [employer].

## 2018-11-23 NOTE — Progress Notes (Signed)
Subjective:   Zachary [name] is a 78 y.o. male who presents for Medicare Annual/Subsequent preventive examination.    This visit is being conducted through telemedicine due to the COVID-19 pandemic. This patient has given me verbal consent via doximity to conduct this visit, patient states they are participating from their home address. Some vital signs may be absent or patient reported.    Patient identification: identified by name, DOB, and current address  Review of Systems:  N/A  Cardiac Risk Factors include: advanced age (>46men, >24 women);dyslipidemia;hypertension;male gender     Objective:    Vitals: There were no vitals taken for this visit.  There is no height or weight on file to calculate BMI. Unable to obtain vitals due to visit being conducted via telephonically.   Advanced Directives 11/23/2018 08/02/2018 11/19/2017 01/22/2017 01/20/2017 11/12/2016 05/15/2015  Does Patient Have a Medical Advance Directive? No No Yes Yes Yes No Yes  Type of Advance Directive - Public librarian;Living will Living will;Healthcare Power of Attorney Living will - Living will  Does patient want to make changes to medical advance directive? - - - - - No - Patient declined No - Patient declined  Copy of [place] in Chart? - - No - copy requested - - - No - copy requested  Would patient like information on creating a medical advance directive? No - Patient declined - - - - - No - patient declined information    Tobacco Social History   Tobacco Use  Smoking Status Never Smoker  Smokeless Tobacco Never Used     Counseling given: Not Answered   Clinical Intake:  Pre-visit preparation completed: Yes  Pain Score: 0-No pain     Diabetes: No  How often do you need to have someone help you when you read instructions, pamphlets, or other written materials from your doctor or pharmacy?: 1 - Never  Interpreter Needed?: No  Information entered by ::  [facility], LPN  Past Medical History:  Diagnosis Date  . Hypercholesterolemia   . Hypertension    Past Surgical History:  Procedure Laterality Date  . APPENDECTOMY    . CATARACT EXTRACTION Bilateral   . PLEURAL SCARIFICATION  05/2015   Family History  Problem Relation Age of Onset  . Hypertension Mother   . Hyperlipidemia Mother   . Heart attack Father   . Hypertension Father   . CVA Father   . ALS Brother   . Prostate cancer Brother    Social History   Socioeconomic History  . Marital status: Married    Spouse name: Not on file  . Number of children: 0  . Years of education: Not on file  . Highest education level: 12th grade  Occupational History  . Occupation: retired  Scientific laboratory technician  . Financial resource strain: Not hard at all  . Food insecurity    Worry: Never true    Inability: Never true  . Transportation needs    Medical: No    Non-medical: No  Tobacco Use  . Smoking status: Never Smoker  . Smokeless tobacco: Never Used  Substance and Sexual Activity  . Alcohol use: No    Alcohol/week: 0.0 standard drinks  . Drug use: No  . Sexual activity: Not on file  Lifestyle  . Physical activity    Days per week: 0 days    Minutes per session: 0 min  . Stress: Not at all  Relationships  . Social connections  Talks on phone: Patient refused    Gets together: Patient refused    Attends religious service: Patient refused    Active member of club or organization: Patient refused    Attends meetings of clubs or organizations: Patient refused    Relationship status: Patient refused  Other Topics Concern  . Not on file  Social History Narrative  . Not on file    Outpatient Encounter Medications as of 11/23/2018  Medication Sig  . aspirin EC 81 MG tablet Take 81 mg by mouth at bedtime.  Marland Kitchen atorvastatin (LIPITOR) 20 MG tablet TAKE 1 TABLET BY MOUTH AT BEDTIME  . lisinopril (PRINIVIL,ZESTRIL) 40 MG tablet TAKE 1 TABLET BY MOUTH ONCE DAILY  . meclizine  (ANTIVERT) 25 MG tablet Take 1 tablet (25 mg total) by mouth 3 (three) times daily as needed for dizziness.  . benzonatate (TESSALON) 200 MG capsule Take 1 capsule (200 mg total) by mouth 3 (three) times daily as needed. (Patient not taking: Reported on 11/04/2018)   No facility-administered encounter medications on file as of 11/23/2018.     Activities of Daily Living In your present state of health, do you have any difficulty performing the following activities: 11/23/2018 04/27/2018  Hearing? N N  Vision? N N  Comment Wears eye glasses daily. -  Difficulty concentrating or making decisions? N N  Walking or climbing stairs? N N  Dressing or bathing? N N  Doing errands, shopping? N N  Preparing Food and eating ? N -  Using the Toilet? N -  In the past six months, have you accidently leaked urine? N -  Do you have problems with loss of bowel control? N -  Managing your Medications? N -  Managing your Finances? N -  Housekeeping or managing your Housekeeping? N -  Some recent data might be hidden    Patient Care Team: Jerrol Banana., MD as PCP - General (Family Medicine) Dingeldein, Remo Lipps, MD as Consulting Physician (Ophthalmology)   Assessment:   This is a routine wellness examination for [place].  Exercise Activities and Dietary recommendations Current Exercise Habits: The patient does not participate in regular exercise at present, Exercise limited by: None identified  Goals    . DIET - INCREASE WATER INTAKE     Recommend increasing water intake to 6-8 glasses a day.     . Increase water intake     Continue drinking 6-8 glasses of water a day.       Fall Risk: Fall Risk  11/23/2018 04/27/2018 11/19/2017 12/30/2016 11/12/2016  Falls in the past year? 0 0 No Yes No  Comment - - - Emmi Telephone Survey: data to providers prior to load -  Number falls in past yr: - - - 1 -  Comment - - - Emmi Telephone Survey Actual Response = 1 -  Injury with Fall? - - - No -     FALL RISK PREVENTION PERTAINING TO THE HOME:  Any stairs in or around the home? No  If so, are there any without handrails? N/A  Home free of loose throw rugs in walkways, pet beds, electrical cords, etc? Yes  Adequate lighting in your home to reduce risk of falls? Yes   ASSISTIVE DEVICES UTILIZED TO PREVENT FALLS:  Life alert? No  Use of a cane, walker or w/c? No  Grab bars in the bathroom? Yes  Shower chair or bench in shower? No  Elevated toilet seat or a handicapped toilet? Yes  TIMED UP  AND GO:  Was the test performed? No .    Depression Screen PHQ 2/9 Scores 11/23/2018 04/27/2018 11/19/2017 11/12/2016  PHQ - 2 Score 0 0 0 0  PHQ- 9 Score - - - 0    Cognitive Function: Pt declined today.      6CIT Screen 11/12/2016  What Year? 0 points  What month? 0 points  What time? 0 points  Count back from 20 0 points  Months in reverse 0 points  Repeat phrase 0 points  Total Score 0    Immunization History  Administered Date(s) Administered  . Influenza, High Dose Seasonal PF 03/01/2015, 05/06/2016, 04/05/2017, 04/27/2018  . Pneumococcal Conjugate-13 06/09/2014  . Pneumococcal Polysaccharide-23 04/07/1996, 07/26/2015  . Td 02/10/2002  . Tdap 06/09/2014  . Zoster 06/30/2013    Qualifies for Shingles Vaccine? Yes  Zostavax completed 06/30/13. Due for Shingrix. Education has been provided regarding the importance of this vaccine. Pt has been advised to call insurance company to determine out of pocket expense. Advised may also receive vaccine at local pharmacy or Health Dept. Verbalized acceptance and understanding.  Tdap: Up to date  Flu Vaccine: Up to date  Pneumococcal Vaccine: Up to date  Screening Tests Health Maintenance  Topic Date Due  . COLONOSCOPY  03/10/2016  . INFLUENZA VACCINE  01/02/2019  . TETANUS/TDAP  06/09/2024  . PNA vac Low Risk Adult  Completed   Cancer Screenings:  Colorectal Screening: Completed 03/11/11. Repeat every 5 years. Referral to  GI placed today. Pt aware the office will call re: appt.  Lung Cancer Screening: (Low Dose CT Chest recommended if Age 46-80 years, 30 pack-year currently smoking OR have quit w/in 15years.) does not qualify.   Additional Screening:  Vision Screening: Recommended annual ophthalmology exams for early detection of glaucoma and other disorders of the eye.  Dental Screening: Recommended annual dental exams for proper oral hygiene  Community Resource Referral:  CRR required this visit?  No        Plan:  I have personally reviewed and addressed the Medicare Annual Wellness questionnaire and have noted the following in the patient's chart:  A. Medical and social history B. Use of alcohol, tobacco or illicit drugs  C. Current medications and supplements D. Functional ability and status E.  Nutritional status F.  Physical activity G. Advance directives H. List of other physicians I.  Hospitalizations, surgeries, and ER visits in previous 12 months J.  [place] such as hearing and vision if needed, cognitive and depression L. Referrals and appointments   In addition, I have reviewed and discussed with patient certain preventive protocols, quality metrics, and best practice recommendations. A written personalized care plan for preventive services as well as general preventive health recommendations were provided to patient.   Glendora Score, [place]  02/01/5175 Nurse Health Advisor   Nurse Notes: None.

## 2018-12-09 ENCOUNTER — Telehealth: Payer: Self-pay

## 2018-12-09 ENCOUNTER — Other Ambulatory Visit: Payer: Self-pay

## 2018-12-09 DIAGNOSIS — Z1211 Encounter for screening for malignant neoplasm of colon: Secondary | ICD-10-CM

## 2018-12-09 MED ORDER — NA SULFATE-K SULFATE-MG SULF 17.5-3.13-1.6 GM/177ML PO SOLN: 1.0000 | Freq: Once | ORAL | 0 refills | Status: AC

## 2018-12-09 NOTE — Telephone Encounter (Signed)
Gastroenterology Pre-Procedure Review  Request Date: 12/24/18 Requesting Physician: Dr. Allen Norris  PATIENT REVIEW QUESTIONS: The patient responded to the following health history questions as indicated:    1. Are you having any GI issues? no 2. Do you have a personal history of Polyps? no 3. Do you have a family history of Colon Cancer or Polyps? no 4. Diabetes Mellitus? no 5. Joint replacements in the past 12 months?no 6. Major health problems in the past 3 months?no 7. Any artificial heart valves, MVP, or defibrillator?no    MEDICATIONS & ALLERGIES:    Patient reports the following regarding taking any anticoagulation/antiplatelet therapy:   Plavix, Coumadin, Eliquis, Xarelto, Lovenox, Pradaxa, Brilinta, or Effient? no Aspirin? no  Patient confirms/reports the following medications:  Current Outpatient Medications  Medication Sig Dispense Refill  . aspirin EC 81 MG tablet Take 81 mg by mouth at bedtime.    Marland Kitchen atorvastatin (LIPITOR) 20 MG tablet TAKE 1 TABLET BY MOUTH AT BEDTIME 90 tablet 3  . benzonatate (TESSALON) 200 MG capsule Take 1 capsule (200 mg total) by mouth 3 (three) times daily as needed. (Patient not taking: Reported on 11/04/2018) 30 capsule 0  . lisinopril (PRINIVIL,ZESTRIL) 40 MG tablet TAKE 1 TABLET BY MOUTH ONCE DAILY 90 tablet 3  . meclizine (ANTIVERT) 25 MG tablet Take 1 tablet (25 mg total) by mouth 3 (three) times daily as needed for dizziness. 30 tablet 0  . Na Sulfate-K Sulfate-Mg Sulf 17.5-3.13-1.6 GM/177ML SOLN Take 1 kit by mouth once for 1 dose. 354 mL 0   No current facility-administered medications for this visit.     Patient confirms/reports the following allergies:  No Known Allergies  No orders of the defined types were placed in this encounter.   AUTHORIZATION INFORMATION Primary Insurance: 1D#: Group #:  Secondary Insurance: 1D#: Group #:  SCHEDULE INFORMATION: Date:12/24/18  Time: Location:[facility]

## 2018-12-15 ENCOUNTER — Ambulatory Visit (INDEPENDENT_AMBULATORY_CARE_PROVIDER_SITE_OTHER): Payer: Medicare Other | Admitting: Family Medicine

## 2018-12-15 ENCOUNTER — Encounter: Payer: Self-pay | Admitting: Family Medicine

## 2018-12-15 ENCOUNTER — Other Ambulatory Visit: Payer: Self-pay

## 2018-12-15 VITALS — BP 128/64 | HR 52 | Temp 98.9°F | Resp 16 | Ht 74.5 in | Wt 188.0 lb

## 2018-12-15 DIAGNOSIS — G250 Essential tremor: Secondary | ICD-10-CM

## 2018-12-15 DIAGNOSIS — I10 Essential (primary) hypertension: Secondary | ICD-10-CM

## 2018-12-15 DIAGNOSIS — E785 Hyperlipidemia, unspecified: Secondary | ICD-10-CM

## 2018-12-15 NOTE — Progress Notes (Signed)
Patient: Zachary [name] Male    DOB: 1940/07/13   78 y.o.   MRN: [id number] Visit Date: 12/15/2018  Today's Provider: [name], MD   Chief Complaint  Patient presents with  . Hypertension   Subjective:    HPI Patient comes in today to discuss BP issues. About 5 days ago, patient called the office c/o feeling faint and lightheaded. He reports that he was in the yard working, and feels that he became too hot. He checked his BP and it was 112/78. He reports that he was still getting "low" readings the day after this incident. Patient was advised to hold Losartan and increase fluid intake. He reports that BP readings at home has averaged in the 120-130s/70s.  BP Readings from Last 3 Encounters:  12/15/18 128/64  11/04/18 120/72  08/02/18 (!) 124/91    Wt Readings from Last 3 Encounters:  12/15/18 188 lb (85.3 kg)  11/04/18 188 lb (85.3 kg)  08/02/18 202 lb (91.6 kg)      No Known Allergies   Current Outpatient Medications:  .  aspirin EC 81 MG tablet, Take 81 mg by mouth at bedtime., Disp: , Rfl:  .  atorvastatin (LIPITOR) 20 MG tablet, TAKE 1 TABLET BY MOUTH AT BEDTIME, Disp: 90 tablet, Rfl: 3 .  meclizine (ANTIVERT) 25 MG tablet, Take 1 tablet (25 mg total) by mouth 3 (three) times daily as needed for dizziness., Disp: 30 tablet, Rfl: 0 .  benzonatate (TESSALON) 200 MG capsule, Take 1 capsule (200 mg total) by mouth 3 (three) times daily as needed. (Patient not taking: Reported on 11/04/2018), Disp: 30 capsule, Rfl: 0 .  lisinopril (PRINIVIL,ZESTRIL) 40 MG tablet, TAKE 1 TABLET BY MOUTH ONCE DAILY (Patient not taking: Reported on 12/15/2018), Disp: 90 tablet, Rfl: 3  Review of Systems  Constitutional: Positive for fatigue. Negative for activity change.  HENT: Negative.   Eyes: Negative for photophobia and visual disturbance.  Respiratory: Negative for cough and shortness of breath.   Cardiovascular: Negative for chest pain, palpitations and leg swelling.   Gastrointestinal: Negative.   Endocrine: Negative.   Musculoskeletal: Negative for arthralgias and myalgias.  Allergic/Immunologic: Negative.   Neurological: Positive for light-headedness. Negative for dizziness and headaches.  Psychiatric/Behavioral: Negative for agitation, self-injury and sleep disturbance. The patient is not nervous/anxious.     Social History   Tobacco Use  . Smoking status: Never Smoker  . Smokeless tobacco: Never Used  Substance Use Topics  . Alcohol use: No    Alcohol/week: 0.0 standard drinks      Objective:   BP 128/64   Pulse (!) 52   Temp 98.9 F (37.2 C)   Resp 16   Ht 6' 2.5" (1.892 m)   Wt 188 lb (85.3 kg)   SpO2 98%   BMI 23.81 kg/m  Vitals:   12/15/18 1331  BP: 128/64  Pulse: (!) 52  Resp: 16  Temp: 98.9 F (37.2 C)  SpO2: 98%  Weight: 188 lb (85.3 kg)  Height: 6' 2.5" (1.892 m)     Physical Exam Vitals signs reviewed.  Constitutional:      Appearance: He is well-developed.  HENT:     Head: Normocephalic and atraumatic.  Eyes:     General: No scleral icterus.    Conjunctiva/sclera: Conjunctivae normal.  Neck:     Thyroid: No thyromegaly.  Cardiovascular:     Rate and Rhythm: Normal rate and regular rhythm.     Heart sounds:  Normal heart sounds.  Pulmonary:     Effort: Pulmonary effort is normal.     Breath sounds: Normal breath sounds.  Abdominal:     Palpations: Abdomen is soft.  Lymphadenopathy:     Cervical: No cervical adenopathy.  Skin:    General: Skin is warm and dry.  Neurological:     General: No focal deficit present.     Mental Status: He is alert and oriented to person, place, and time.     Comments: I noted right > left hand tremor at rest.   Psychiatric:        Behavior: Behavior normal.        Thought Content: Thought content normal.        Judgment: Judgment normal.      No results found for any visits on 12/15/18.     Assessment & Plan  1. Essential hypertension Stop Lisinopril for  now. RTC in 3-4 weeks.  2. Benign essential tremor Follow for now. Possible early PD with almost pill rolling motion.  3. Hyperlipidemia, unspecified hyperlipidemia type    I, Rachelle L. Presley, CMA, am acting as a Education administrator for [employer]. Rosanna Randy, MD.   I have done the exam and reviewed the chart and it is accurate to the best of my knowledge. Development worker, community has been used and  any errors in dictation or transcription are unintentional. Miguel Aschoff M.D. [place], MD  [facility]

## 2018-12-15 NOTE — Patient Instructions (Signed)
Stop Lisinopril.

## 2018-12-17 ENCOUNTER — Encounter: Payer: Self-pay | Admitting: *Deleted

## 2018-12-17 ENCOUNTER — Other Ambulatory Visit: Payer: Self-pay

## 2018-12-21 ENCOUNTER — Other Ambulatory Visit: Payer: Self-pay

## 2018-12-21 ENCOUNTER — Other Ambulatory Visit
Admission: RE | Admit: 2018-12-21 | Discharge: 2018-12-21 | Disposition: A | Discharge status: Home / Self Care | Payer: Medicare Other | Source: Ambulatory Visit | Attending: Gastroenterology | Admitting: Gastroenterology

## 2018-12-21 DIAGNOSIS — Z1159 Encounter for screening for other viral diseases: Secondary | ICD-10-CM | POA: Diagnosis not present

## 2018-12-21 LAB — SARS CORONAVIRUS 2 (TAT 6-24 HRS): SARS Coronavirus 2: NEGATIVE

## 2018-12-23 NOTE — Anesthesia Preprocedure Evaluation (Addendum)
Anesthesia Evaluation  Patient identified by MRN, date of birth, ID band Patient awake    Reviewed: Allergy & Precautions, H&P , NPO status , Patient's Chart, lab work & pertinent test results, reviewed documented beta blocker date and time   History of Anesthesia Complications Negative for: history of anesthetic complications  Airway Mallampati: III  TM Distance: >3 FB Neck ROM: full    Dental  (+) Dental Advidsory Given, Caps, Teeth Intact   Pulmonary neg pulmonary ROS,    Pulmonary exam normal        Cardiovascular Exercise Tolerance: Good hypertension, (-) angina(-) Past MI Normal cardiovascular exam(-) dysrhythmias (-) Valvular Problems/Murmurs     Neuro/Psych Vertigo; benign essential tremor negative neurological ROS  negative psych ROS   GI/Hepatic negative GI ROS, Neg liver ROS,   Endo/Other  negative endocrine ROS  Renal/GU negative Renal ROS  negative genitourinary   Musculoskeletal   Abdominal   Peds  Hematology negative hematology ROS (+)   Anesthesia Other Findings Past Medical History: No date: Hypercholesterolemia No date: Hypertension No date: Vertigo     Comment:  last episode approx 09/2018   Reproductive/Obstetrics negative OB ROS                           Anesthesia Physical Anesthesia Plan  ASA: II  Anesthesia Plan: General   Post-op Pain Management:    Induction: Intravenous  PONV Risk Score and Plan: 2 and Propofol infusion and TIVA  Airway Management Planned: Natural Airway and Nasal Cannula  Additional Equipment:   Intra-op Plan:   Post-operative Plan:   Informed Consent: I have reviewed the patients History and Physical, chart, labs and discussed the procedure including the risks, benefits and alternatives for the proposed anesthesia with the patient or authorized representative who has indicated his/her understanding and acceptance.     Dental  Advisory Given  Plan Discussed with: Anesthesiologist, CRNA and Surgeon  Anesthesia Plan Comments:        Anesthesia Quick Evaluation

## 2018-12-23 NOTE — Discharge Instructions (Signed)

## 2018-12-24 ENCOUNTER — Encounter: Payer: Self-pay | Admitting: Anesthesiology

## 2018-12-24 ENCOUNTER — Other Ambulatory Visit: Payer: Self-pay

## 2018-12-24 ENCOUNTER — Ambulatory Visit
Admission: RE | Admit: 2018-12-24 | Discharge: 2018-12-24 | Disposition: A | Discharge status: Home / Self Care | Payer: Medicare Other | Attending: Gastroenterology | Admitting: Gastroenterology

## 2018-12-24 ENCOUNTER — Encounter
Admission: RE | Disposition: A | Discharge status: Home / Self Care | Payer: Self-pay | Source: Home / Self Care | Attending: Gastroenterology

## 2018-12-24 DIAGNOSIS — K5730 Diverticulosis of large intestine without perforation or abscess without bleeding: Secondary | ICD-10-CM | POA: Insufficient documentation

## 2018-12-24 DIAGNOSIS — Z7189 Other specified counseling: Secondary | ICD-10-CM

## 2018-12-24 DIAGNOSIS — Z7982 Long term (current) use of aspirin: Secondary | ICD-10-CM | POA: Insufficient documentation

## 2018-12-24 DIAGNOSIS — K635 Polyp of colon: Secondary | ICD-10-CM

## 2018-12-24 DIAGNOSIS — K5790 Diverticulosis of intestine, part unspecified, without perforation or abscess without bleeding: Secondary | ICD-10-CM | POA: Diagnosis not present

## 2018-12-24 DIAGNOSIS — Z1211 Encounter for screening for malignant neoplasm of colon: Secondary | ICD-10-CM | POA: Diagnosis not present

## 2018-12-24 DIAGNOSIS — D123 Benign neoplasm of transverse colon: Secondary | ICD-10-CM

## 2018-12-24 DIAGNOSIS — D124 Benign neoplasm of descending colon: Secondary | ICD-10-CM | POA: Diagnosis not present

## 2018-12-24 DIAGNOSIS — I10 Essential (primary) hypertension: Secondary | ICD-10-CM | POA: Diagnosis not present

## 2018-12-24 DIAGNOSIS — D120 Benign neoplasm of cecum: Secondary | ICD-10-CM | POA: Diagnosis not present

## 2018-12-24 DIAGNOSIS — D126 Benign neoplasm of colon, unspecified: Secondary | ICD-10-CM | POA: Diagnosis not present

## 2018-12-24 DIAGNOSIS — E7800 Pure hypercholesterolemia, unspecified: Secondary | ICD-10-CM | POA: Diagnosis not present

## 2018-12-24 HISTORY — PX: COLONOSCOPY WITH PROPOFOL: SHX5780

## 2018-12-24 HISTORY — DX: Dizziness and giddiness: R42

## 2018-12-24 SURGERY — COLONOSCOPY WITH PROPOFOL
Anesthesia: General

## 2018-12-24 MED ORDER — SODIUM CHLORIDE 0.9 % IV SOLN
INTRAVENOUS | Status: DC
  Administered 2018-12-24: 09:00:00 via INTRAVENOUS

## 2018-12-24 MED ORDER — PROPOFOL 500 MG/50ML IV EMUL
INTRAVENOUS | Status: DC | PRN
  Administered 2018-12-24: 150 ug/kg/min via INTRAVENOUS

## 2018-12-24 MED ORDER — PROPOFOL 10 MG/ML IV BOLUS
INTRAVENOUS | Status: DC | PRN
  Administered 2018-12-24: 50 mg via INTRAVENOUS
  Administered 2018-12-24: 20 mg via INTRAVENOUS

## 2018-12-24 NOTE — H&P (Signed)
Zachary Lame, MD [address] [place], [place] [address] Phone: [phone] Fax : [phone]  Primary Care Physician:  Jerrol Banana., MD Primary Gastroenterologist:  Dr. Allen Norris  Pre-Procedure History & Physical: HPI:  Zachary [name] is a 78 y.o. male is here for a screening colonoscopy.   Past Medical History:  Diagnosis Date  . Hypercholesterolemia   . Hypertension   . Vertigo    last episode approx 09/2018    Past Surgical History:  Procedure Laterality Date  . APPENDECTOMY    . CATARACT EXTRACTION Bilateral   . PLEURAL SCARIFICATION  05/2015    Prior to Admission medications   Medication Sig Start Date End Date Taking? Authorizing Provider  aspirin EC 81 MG tablet Take 81 mg by mouth at bedtime.   Yes [provider]  atorvastatin (LIPITOR) 20 MG tablet TAKE 1 TABLET BY MOUTH AT BEDTIME 08/10/18  Yes Jerrol Banana., MD  meclizine (ANTIVERT) 25 MG tablet Take 1 tablet (25 mg total) by mouth 3 (three) times daily as needed for dizziness. 01/22/17  Yes Jerrol Banana., MD  lisinopril (PRINIVIL,ZESTRIL) 40 MG tablet TAKE 1 TABLET BY MOUTH ONCE DAILY Patient not taking: Reported on 12/15/2018 05/11/18   Jerrol Banana., MD    Allergies as of 12/09/2018  . (No Known Allergies)    Family History  Problem Relation Age of Onset  . Hypertension Mother   . Hyperlipidemia Mother   . Heart attack Father   . Hypertension Father   . CVA Father   . ALS Brother   . Prostate cancer Brother     Social History   Socioeconomic History  . Marital status: Married    Spouse name: Not on file  . Number of children: 0  . Years of education: Not on file  . Highest education level: 12th grade  Occupational History  . Occupation: retired  Scientific laboratory technician  . Financial resource strain: Not hard at all  . Food insecurity    Worry: Never true    Inability: Never true  . Transportation needs    Medical: No    Non-medical: No   Tobacco Use  . Smoking status: Never Smoker  . Smokeless tobacco: Never Used  Substance and Sexual Activity  . Alcohol use: No    Alcohol/week: 0.0 standard drinks  . Drug use: No  . Sexual activity: Not on file  Lifestyle  . Physical activity    Days per week: 0 days    Minutes per session: 0 min  . Stress: Not at all  Relationships  . Social Herbalist on phone: Patient refused    Gets together: Patient refused    Attends religious service: Patient refused    Active member of club or organization: Patient refused    Attends meetings of clubs or organizations: Patient refused    Relationship status: Patient refused  . Intimate partner violence    Fear of current or ex partner: Patient refused    Emotionally abused: Patient refused    Physically abused: Patient refused    Forced sexual activity: Patient refused  Other Topics Concern  . Not on file  Social History Narrative  . Not on file    Review of Systems: See HPI, otherwise negative ROS  Physical Exam: BP (!) 151/113   Pulse 74   Temp 97.7 F (36.5 C) (Oral)   Resp 16   Ht 6' 2.5" (1.892  m)   Wt 88.5 kg   SpO2 98%   BMI 24.70 kg/m  General:   Alert,  pleasant and cooperative in NAD Head:  Normocephalic and atraumatic. Neck:  Supple; no masses or thyromegaly. Lungs:  Clear throughout to auscultation.    Heart:  Regular rate and rhythm. Abdomen:  Soft, nontender and nondistended. Normal bowel sounds, without guarding, and without rebound.   Neurologic:  Alert and  oriented x4;  grossly normal neurologically.  Impression/Plan: Zachary [name] is now here to undergo a screening colonoscopy.  Risks, benefits, and alternatives regarding colonoscopy have been reviewed with the patient.  Questions have been answered.  All parties agreeable.

## 2018-12-24 NOTE — Anesthesia Post-op Follow-up Note (Signed)
Anesthesia QCDR form completed.        

## 2018-12-24 NOTE — Op Note (Signed)
[facility] Patient Name: Zachary [name] Procedure Date: 12/24/2018 9:03 AM MRN: [id number] Account #: [id number] Date of Birth: 05-09-1941 Admit Type: Outpatient Age: 78 Room: [facility] ENDO ROOM 4 Gender: Male Note Status: Finalized Procedure:            Colonoscopy Indications:          Screening for colorectal malignant neoplasm Providers:            Lucilla Lame MD, MD Referring MD:         Janine Ores. Rosanna Randy, MD (Referring MD) Medicines:            Propofol per Anesthesia Complications:        No immediate complications. Procedure:            Pre-Anesthesia Assessment:                       - Prior to the procedure, a History and Physical was                        performed, and patient medications and allergies were                        reviewed. The patient's tolerance of previous                        anesthesia was also reviewed. The risks and benefits of                        the procedure and the sedation options and risks were                        discussed with the patient. All questions were                        answered, and informed consent was obtained. Prior                        Anticoagulants: The patient has taken no previous                        anticoagulant or antiplatelet agents. ASA Grade                        Assessment: II - A patient with mild systemic disease.                        After reviewing the risks and benefits, the patient was                        deemed in satisfactory condition to undergo the                        procedure.                       After obtaining informed consent, the colonoscope was                        passed under direct vision. Throughout the procedure,  the patient's blood pressure, pulse, and oxygen                        saturations were monitored continuously. The                        Colonoscope was introduced through the anus and                advanced to the the cecum, identified by appendiceal                        orifice and ileocecal valve. The colonoscopy was                        performed without difficulty. The patient tolerated the                        procedure well. The quality of the bowel preparation                        was fair. Findings:      The perianal and digital rectal examinations were normal.      A 10 mm polyp was found in the cecum. The polyp was sessile. Area was       successfully injected with 4 mL saline with indigo carmine for a lift       polypectomy. The polyp was removed with a hot snare. Resection and       retrieval were complete. To prevent bleeding post-intervention, two       hemostatic clips were successfully placed (MR conditional). There was no       bleeding at the end of the procedure.      Two sessile polyps were found in the transverse colon. The polyps were 3       to 4 mm in size. These polyps were removed with a cold snare. Resection       and retrieval were complete.      A 4 mm polyp was found in the descending colon. The polyp was sessile.       The polyp was removed with a cold snare. Resection and retrieval were       complete.      Multiple small-mouthed diverticula were found in the sigmoid colon. Impression:           - Preparation of the colon was fair.                       - One 10 mm polyp in the cecum, removed with a hot                        snare. Resected and retrieved. Injected. Clips (MR                        conditional) were placed.                       - Two 3 to 4 mm polyps in the transverse colon, removed                        with a cold snare. Resected and retrieved.                       -  One 4 mm polyp in the descending colon, removed with                        a cold snare. Resected and retrieved.                       - Diverticulosis in the sigmoid colon. Recommendation:       - Discharge patient to home.                        - Resume previous diet.                       - Continue present medications.                       - Await pathology results.                       - Repeat colonoscopy in 1 year for surveillance. Procedure Code(s):    --- Professional ---                       [phone], Colonoscopy, flexible; with removal of tumor(s),                        polyp(s), or other lesion(s) by snare technique                       45381, Colonoscopy, flexible; with directed submucosal                        injection(s), any substance Diagnosis Code(s):    --- Professional ---                       Z12.11, Encounter for screening for malignant neoplasm                        of colon                       K63.5, Polyp of colon CPT copyright 2019 American Medical Association. All rights reserved. The codes documented in this report are preliminary and upon coder review may  be revised to meet current compliance requirements. Lucilla Lame MD, MD 12/24/2018 9:52:42 AM This report has been signed electronically. Number of Addenda: 0 Note Initiated On: 12/24/2018 9:03 AM Scope Withdrawal Time: 0 hours 20 minutes 45 seconds  Total Procedure Duration: 0 hours 27 minutes 16 seconds  Estimated Blood Loss: Estimated blood loss: none.      [facility]

## 2018-12-24 NOTE — Transfer of Care (Signed)
Immediate Anesthesia Transfer of Care Note  Patient: Zachary [name]  Procedure(s) Performed: COLONOSCOPY WITH PROPOFOL (N/A )  Patient Location: PACU  Anesthesia Type:General  Level of Consciousness: sedated  Airway & Oxygen Therapy: Patient Spontanous Breathing and Patient connected to nasal cannula oxygen  Post-op Assessment: Report given to RN and Post -op Vital signs reviewed and stable  Post vital signs: Reviewed and stable  Last Vitals:  Vitals Value Taken Time  BP    Temp    Pulse 60 12/24/18 0957  Resp    SpO2 97 % 12/24/18 0957  Vitals shown include unvalidated device data.  Last Pain:  Vitals:   12/24/18 0834  TempSrc: Oral  PainSc: 0-No pain         Complications: No apparent anesthesia complications

## 2018-12-24 NOTE — Anesthesia Procedure Notes (Signed)
Date/Time: 12/24/2018 9:21 AM Performed by: Nelda Marseille, CRNA Pre-anesthesia Checklist: Patient identified, Emergency Drugs available, Suction available, Patient being monitored and Timeout performed Oxygen Delivery Method: Nasal cannula

## 2018-12-25 ENCOUNTER — Encounter: Payer: Self-pay | Admitting: Gastroenterology

## 2018-12-28 ENCOUNTER — Encounter: Payer: Self-pay | Admitting: Gastroenterology

## 2018-12-28 LAB — SURGICAL PATHOLOGY

## 2018-12-28 NOTE — Anesthesia Postprocedure Evaluation (Signed)
Anesthesia Post Note  Patient: Zachary [name]  Procedure(s) Performed: COLONOSCOPY WITH PROPOFOL (N/A )  Patient location during evaluation: Endoscopy Anesthesia Type: General Level of consciousness: awake and alert Pain management: pain level controlled Vital Signs Assessment: post-procedure vital signs reviewed and stable Respiratory status: spontaneous breathing, nonlabored ventilation, respiratory function stable and patient connected to nasal cannula oxygen Cardiovascular status: blood pressure returned to baseline and stable Postop Assessment: no apparent nausea or vomiting Anesthetic complications: no     Last Vitals:  Vitals:   12/24/18 0834 12/24/18 0958  BP: (!) 151/113 115/73  Pulse: 74   Resp: 16   Temp: 36.5 C   SpO2: 98%     Last Pain:  Vitals:   12/25/18 0736  TempSrc:   PainSc: 0-No pain                 Martha Clan

## 2018-12-29 DIAGNOSIS — H59811 Chorioretinal scars after surgery for detachment, right eye: Secondary | ICD-10-CM | POA: Diagnosis not present

## 2018-12-30 ENCOUNTER — Encounter: Payer: Self-pay | Admitting: *Deleted

## 2019-01-06 ENCOUNTER — Ambulatory Visit (INDEPENDENT_AMBULATORY_CARE_PROVIDER_SITE_OTHER): Payer: Medicare Other | Admitting: Family Medicine

## 2019-01-06 ENCOUNTER — Other Ambulatory Visit: Payer: Self-pay

## 2019-01-06 ENCOUNTER — Encounter: Payer: Self-pay | Admitting: Family Medicine

## 2019-01-06 VITALS — BP 147/90 | HR 67 | Temp 97.7°F | Resp 16 | Wt 188.2 lb

## 2019-01-06 DIAGNOSIS — E785 Hyperlipidemia, unspecified: Secondary | ICD-10-CM | POA: Diagnosis not present

## 2019-01-06 DIAGNOSIS — G250 Essential tremor: Secondary | ICD-10-CM

## 2019-01-06 DIAGNOSIS — I10 Essential (primary) hypertension: Secondary | ICD-10-CM

## 2019-01-06 NOTE — Patient Instructions (Signed)
Restart Lisinopril 40mg   ( take only half tablet)

## 2019-01-06 NOTE — Progress Notes (Signed)
Patient: Zachary [name] Male    DOB: [birth year]   78 y.o.   MRN: [id number] Visit Date: 01/06/2019  Today's Provider: [name], MD   Chief Complaint  Patient presents with  . Hypertension   Subjective:     HPI   Hypertension, follow-up:  BP Readings from Last 3 Encounters:  01/06/19 (!) 147/90  12/24/18 115/73  12/15/18 128/64  Pt feeling well but BP running 140-160 .  He was last seen for hypertension 3 weeks ago.  BP at that visit was 115/73. Management changes since that visit include stopping Lisinopril .  He is not having side effects.  He is exercising. He is adherent to low salt diet.   Outside blood pressures are being checked regularly,blood pressure in office read at 147/90 with patients machine. He is experiencing none.  Patient denies chest pain, dyspnea, palpitations or shortness of breath on exertion.   Cardiovascular risk factors include advanced age (older than 19 for men, 53 for women), hypertension and male gender.  Use of agents associated with hypertension: NSAIDS.     Weight trend: stable Wt Readings from Last 3 Encounters:  01/06/19 188 lb 3.2 oz (85.4 kg)  12/24/18 195 lb (88.5 kg)  12/15/18 188 lb (85.3 kg)    Current diet: in general, a "healthy" diet    ------------------------------------------------------------------------   No Known Allergies   Current Outpatient Medications:  .  aspirin EC 81 MG tablet, Take 81 mg by mouth at bedtime., Disp: , Rfl:  .  atorvastatin (LIPITOR) 20 MG tablet, TAKE 1 TABLET BY MOUTH AT BEDTIME, Disp: 90 tablet, Rfl: 3 .  lisinopril (PRINIVIL,ZESTRIL) 40 MG tablet, TAKE 1 TABLET BY MOUTH ONCE DAILY (Patient not taking: Reported on 12/15/2018), Disp: 90 tablet, Rfl: 3 .  meclizine (ANTIVERT) 25 MG tablet, Take 1 tablet (25 mg total) by mouth 3 (three) times daily as needed for dizziness., Disp: 30 tablet, Rfl: 0  Review of Systems  Constitutional: Negative.   HENT: Negative.    Eyes: Negative.   Respiratory: Negative.   Cardiovascular: Negative.   Gastrointestinal: Negative.   Endocrine: Negative.   Musculoskeletal: Negative.   Allergic/Immunologic: Negative.   Neurological: Negative.   Hematological: Negative.   Psychiatric/Behavioral: Negative.     Social History   Tobacco Use  . Smoking status: Never Smoker  . Smokeless tobacco: Never Used  Substance Use Topics  . Alcohol use: No    Alcohol/week: 0.0 standard drinks      Objective:   BP (!) 147/90   Pulse 67   Temp 97.7 F (36.5 C) (Oral)   Resp 16   Wt 188 lb 3.2 oz (85.4 kg)   BMI 23.84 kg/m  Vitals:   01/06/19 1102  BP: (!) 147/90  Pulse: 67  Resp: 16  Temp: 97.7 F (36.5 C)  TempSrc: Oral  Weight: 188 lb 3.2 oz (85.4 kg)     Physical Exam Vitals signs reviewed.  Constitutional:      Appearance: He is well-developed.  HENT:     Head: Normocephalic and atraumatic.  Eyes:     General: No scleral icterus.    Conjunctiva/sclera: Conjunctivae normal.  Neck:     Thyroid: No thyromegaly.  Cardiovascular:     Rate and Rhythm: Normal rate and regular rhythm.     Heart sounds: Normal heart sounds.  Pulmonary:     Effort: Pulmonary effort is normal.     Breath sounds: Normal breath sounds.  Abdominal:     Palpations: Abdomen is soft.  Lymphadenopathy:     Cervical: No cervical adenopathy.  Skin:    General: Skin is warm and dry.  Neurological:     General: No focal deficit present.     Mental Status: He is alert and oriented to person, place, and time.     Comments: I noted right > left hand tremor at rest.   Psychiatric:        Mood and Affect: Mood normal.        Behavior: Behavior normal.        Thought Content: Thought content normal.        Judgment: Judgment normal.      No results found for any visits on 01/06/19.     Assessment & Plan    1. Essential hypertension Restart Lisinopril 40mg  1/2 tablet daily. Recheck in 2 months.    2. Benign essential  tremor Follow clincally.  3. Hyperlipidemia, unspecified hyperlipidemia type    [name], MD  [facility] Fritzi Mandes [place] as a scribe for [name], MD.,have documented all relevant documentation on the behalf of [name], MD,as directed by  [name], MD while in the presence of [name], MD.

## 2019-02-10 DIAGNOSIS — L570 Actinic keratosis: Secondary | ICD-10-CM | POA: Diagnosis not present

## 2019-02-10 DIAGNOSIS — Z86018 Personal history of other benign neoplasm: Secondary | ICD-10-CM | POA: Diagnosis not present

## 2019-02-10 DIAGNOSIS — Z872 Personal history of diseases of the skin and subcutaneous tissue: Secondary | ICD-10-CM | POA: Diagnosis not present

## 2019-02-10 DIAGNOSIS — L578 Other skin changes due to chronic exposure to nonionizing radiation: Secondary | ICD-10-CM | POA: Diagnosis not present

## 2019-02-10 DIAGNOSIS — L821 Other seborrheic keratosis: Secondary | ICD-10-CM | POA: Diagnosis not present

## 2019-02-10 DIAGNOSIS — Z85828 Personal history of other malignant neoplasm of skin: Secondary | ICD-10-CM | POA: Diagnosis not present

## 2019-03-08 ENCOUNTER — Other Ambulatory Visit: Payer: Self-pay

## 2019-03-08 ENCOUNTER — Encounter: Payer: Self-pay | Admitting: Family Medicine

## 2019-03-08 ENCOUNTER — Ambulatory Visit (INDEPENDENT_AMBULATORY_CARE_PROVIDER_SITE_OTHER): Payer: Medicare Other | Admitting: Family Medicine

## 2019-03-08 VITALS — BP 118/76 | HR 62 | Temp 98.1°F | Resp 16 | Ht 75.0 in | Wt 188.0 lb

## 2019-03-08 DIAGNOSIS — I10 Essential (primary) hypertension: Secondary | ICD-10-CM | POA: Diagnosis not present

## 2019-03-08 DIAGNOSIS — Z23 Encounter for immunization: Secondary | ICD-10-CM | POA: Diagnosis not present

## 2019-03-08 NOTE — Progress Notes (Signed)
Patient: Zachary [name] Male    DOB: 05-17-1941   78 y.o.   MRN: [id number] Visit Date: 03/08/2019  Today's Provider: [name], MD   Chief Complaint  Patient presents with  . Hypertension   Subjective:   HPI  Hypertension, follow-up:  BP Readings from Last 3 Encounters:  03/08/19 118/76  01/06/19 (!) 147/90  12/24/18 115/73    He was last seen for hypertension 2 months ago.  BP at that visit was 147/90. Management since that visit includes restarting Lisinopril 40mg  1/2 tablet daily . He reports good compliance with treatment. He is not having side effects.  He is exercising. He is adherent to low salt diet.   Outside blood pressures are checked at home. He reports that it has averaged in the 130s/80s.  He is experiencing none.  Patient denies exertional chest pressure/discomfort, lower extremity edema and palpitations.     Weight trend: stable Wt Readings from Last 3 Encounters:  03/08/19 188 lb (85.3 kg)  01/06/19 188 lb 3.2 oz (85.4 kg)  12/24/18 195 lb (88.5 kg)    Current diet: well balanced   No Known Allergies   Current Outpatient Medications:  .  aspirin EC 81 MG tablet, Take 81 mg by mouth at bedtime., Disp: , Rfl:  .  atorvastatin (LIPITOR) 20 MG tablet, TAKE 1 TABLET BY MOUTH AT BEDTIME, Disp: 90 tablet, Rfl: 3 .  lisinopril (PRINIVIL,ZESTRIL) 40 MG tablet, TAKE 1 TABLET BY MOUTH ONCE DAILY (Patient taking differently: Take 1/2 tablet daily.), Disp: 90 tablet, Rfl: 3 .  meclizine (ANTIVERT) 25 MG tablet, Take 1 tablet (25 mg total) by mouth 3 (three) times daily as needed for dizziness., Disp: 30 tablet, Rfl: 0  Review of Systems  Constitutional: Negative for activity change and fatigue.  Respiratory: Negative for cough and shortness of breath.   Cardiovascular: Negative for chest pain, palpitations and leg swelling.  Musculoskeletal: Negative for arthralgias, gait problem and joint swelling.  Neurological: Negative for  dizziness, light-headedness and headaches.  Psychiatric/Behavioral: Negative for agitation, self-injury, sleep disturbance and suicidal ideas.    Social History   Tobacco Use  . Smoking status: Never Smoker  . Smokeless tobacco: Never Used  Substance Use Topics  . Alcohol use: No    Alcohol/week: 0.0 standard drinks      Objective:   BP 118/76   Pulse 62   Temp 98.1 F (36.7 C)   Resp 16   Ht 6\' 3"  (1.905 m)   Wt 188 lb (85.3 kg)   SpO2 98%   BMI 23.50 kg/m  Vitals:   03/08/19 0839  BP: 118/76  Pulse: 62  Resp: 16  Temp: 98.1 F (36.7 C)  SpO2: 98%  Weight: 188 lb (85.3 kg)  Height: 6\' 3"  (1.905 m)  Body mass index is 23.5 kg/m.   Physical Exam Vitals signs reviewed.  Constitutional:      Appearance: He is well-developed.  HENT:     Head: Normocephalic and atraumatic.  Eyes:     General: No scleral icterus.    Conjunctiva/sclera: Conjunctivae normal.  Neck:     Thyroid: No thyromegaly.  Cardiovascular:     Rate and Rhythm: Normal rate and regular rhythm.     Heart sounds: Normal heart sounds.  Pulmonary:     Effort: Pulmonary effort is normal.     Breath sounds: Normal breath sounds.  Abdominal:     Palpations: Abdomen is soft.  Lymphadenopathy:  Cervical: No cervical adenopathy.  Skin:    General: Skin is warm and dry.  Neurological:     General: No focal deficit present.     Mental Status: He is alert and oriented to person, place, and time.     Comments: I noted right > left hand tremor at rest.   Psychiatric:        Mood and Affect: Mood normal.        Behavior: Behavior normal.        Thought Content: Thought content normal.        Judgment: Judgment normal.      No results found for any visits on 03/08/19.     Assessment & Plan    1. Essential hypertension Patient feels better on half dose of lisinopril.  See him back in 2021.  2. Need for influenza vaccination  - Flu Vaccine QUAD High Dose(Fluad)     [name], MD  [facility]

## 2019-04-12 DIAGNOSIS — I10 Essential (primary) hypertension: Secondary | ICD-10-CM | POA: Diagnosis not present

## 2019-04-12 DIAGNOSIS — D120 Benign neoplasm of cecum: Secondary | ICD-10-CM | POA: Insufficient documentation

## 2019-04-12 DIAGNOSIS — E782 Mixed hyperlipidemia: Secondary | ICD-10-CM | POA: Diagnosis not present

## 2019-04-12 DIAGNOSIS — Z7189 Other specified counseling: Secondary | ICD-10-CM

## 2019-04-12 DIAGNOSIS — I38 Endocarditis, valve unspecified: Secondary | ICD-10-CM | POA: Insufficient documentation

## 2019-04-12 DIAGNOSIS — I2510 Atherosclerotic heart disease of native coronary artery without angina pectoris: Secondary | ICD-10-CM | POA: Insufficient documentation

## 2019-04-12 DIAGNOSIS — K635 Polyp of colon: Secondary | ICD-10-CM

## 2019-04-12 DIAGNOSIS — R42 Dizziness and giddiness: Secondary | ICD-10-CM | POA: Diagnosis not present

## 2019-04-12 DIAGNOSIS — Z1211 Encounter for screening for malignant neoplasm of colon: Secondary | ICD-10-CM

## 2019-06-21 ENCOUNTER — Other Ambulatory Visit: Payer: Self-pay

## 2019-06-21 ENCOUNTER — Encounter: Payer: Self-pay | Admitting: Emergency Medicine

## 2019-06-21 ENCOUNTER — Ambulatory Visit
Admission: EM | Admit: 2019-06-21 | Discharge: 2019-06-21 | Disposition: A | Discharge status: Home / Self Care | Payer: Medicare Other | Attending: Emergency Medicine | Admitting: Emergency Medicine

## 2019-06-21 DIAGNOSIS — R300 Dysuria: Secondary | ICD-10-CM | POA: Diagnosis not present

## 2019-06-21 LAB — URINALYSIS, COMPLETE (UACMP) WITH MICROSCOPIC
Bilirubin Urine: NEGATIVE
Glucose, UA: NEGATIVE mg/dL
Hgb urine dipstick: NEGATIVE
Ketones, ur: NEGATIVE mg/dL
Leukocytes,Ua: NEGATIVE
Nitrite: NEGATIVE
Protein, ur: 100 mg/dL — AB
Specific Gravity, Urine: 1.015 (ref 1.005–1.030)
pH: 6 (ref 5.0–8.0)

## 2019-06-21 MED ORDER — SULFAMETHOXAZOLE-TRIMETHOPRIM 800-160 MG PO TABS: 1.0000 | ORAL_TABLET | Freq: Two times a day (BID) | ORAL | 0 refills | Status: AC

## 2019-06-21 NOTE — ED Triage Notes (Signed)
Pt c/o dysuria. Started about 4 days ago. Denies lower back pain, pelvic pain or urinary frequency or urgency. Denies fever.

## 2019-06-21 NOTE — ED Provider Notes (Signed)
[place], [place]   Name: MICAJAH OAS DOB: 1941/01/31 MRN: [id number] CSN: [id number] PCP: Jerrol Banana., MD  Arrival date and time:  06/21/19 1009  Chief Complaint:  Dysuria   NOTE: Prior to seeing the patient today, I have reviewed the triage nursing documentation and vital signs. Clinical staff has updated patient's PMH/PSHx, current medication list, and drug allergies/intolerances to ensure comprehensive history available to assist in medical decision making.   History:   HPI: ANDRIC HOTZ is a 79 y.o. male who presents today with complaints of urinary symptoms that began with acute onset 4 days ago. He complains of worsening dysuria, and denies frequency and urgency. He has not appreciated any gross hematuria, nor has he noticed his urine being malodorous. Patient denies any associated nausea, vomiting, fever, or chills. He has not experienced any pain in his lower back, flank area, and pressure in his suprapubic area. Patient advises that he does not have a past medical history that is significant for recurrent urinary tract infections, STIs, or urolithiasis. He denies pain in his penis, testicles, or scrotum; no swelling. No penile bleeding or discharge.   Past Medical History:  Diagnosis Date  . Hypercholesterolemia   . Hypertension   . Vertigo    last episode approx 09/2018    Past Surgical History:  Procedure Laterality Date  . APPENDECTOMY    . CATARACT EXTRACTION Bilateral   . COLONOSCOPY WITH PROPOFOL N/A 12/24/2018   Procedure: COLONOSCOPY WITH PROPOFOL;  Surgeon: Lucilla Lame, MD;  Location: [facility] ENDOSCOPY;  Service: Endoscopy;  Laterality: N/A;  . PLEURAL SCARIFICATION  05/2015    Family History  Problem Relation Age of Onset  . Hypertension Mother   . Hyperlipidemia Mother   . Heart attack Father   . Hypertension Father   . CVA Father   . ALS Brother   . Prostate cancer Brother     Social History   Tobacco Use  . Smoking status:  Never Smoker  . Smokeless tobacco: Never Used  Substance Use Topics  . Alcohol use: No    Alcohol/week: 0.0 standard drinks  . Drug use: No    Patient Active Problem List   Diagnosis Date Noted  . Encounter for screening colonoscopy   . Polyp of transverse colon   . Benign neoplasm of cecum   . Pneumothorax on right 05/15/2015  . Benign essential tremor 02/28/2015  . HLD (hyperlipidemia) 02/28/2015  . BP (high blood pressure) 02/28/2015  . Hypercholesterolemia without hypertriglyceridemia 02/28/2015  . Vertigo 02/28/2015    Home Medications:    Current Meds  Medication Sig  . aspirin EC 81 MG tablet Take 81 mg by mouth at bedtime.  Marland Kitchen atorvastatin (LIPITOR) 20 MG tablet TAKE 1 TABLET BY MOUTH AT BEDTIME  . lisinopril (PRINIVIL,ZESTRIL) 40 MG tablet TAKE 1 TABLET BY MOUTH ONCE DAILY (Patient taking differently: Take 1/2 tablet daily.)  . meclizine (ANTIVERT) 25 MG tablet Take 1 tablet (25 mg total) by mouth 3 (three) times daily as needed for dizziness.    Allergies:   Patient has no known allergies.  Review of Systems (ROS): Review of Systems  Constitutional: Negative for chills and fever.  Respiratory: Negative for cough and shortness of breath.   Cardiovascular: Negative for chest pain and palpitations.  Gastrointestinal: Negative for abdominal pain, nausea and vomiting.  Genitourinary: Positive for dysuria. Negative for discharge, flank pain, frequency, hematuria, penile pain, penile swelling, scrotal swelling, testicular pain and urgency.  Musculoskeletal: Negative for back  pain.  Skin: Negative for color change, pallor and rash.  Neurological: Negative for dizziness, syncope, weakness and headaches.  All other systems reviewed and are negative.    Vital Signs: Today's Vitals   06/21/19 1042 06/21/19 1046  BP:  (!) 150/103  Pulse:  68  Resp:  18  Temp:  97.7 F (36.5 C)  TempSrc:  Oral  SpO2:  99%  Weight: 195 lb (88.5 kg)   Height: 6' 2.5" (1.892 m)    PainSc: 0-No pain     Physical Exam: Physical Exam  Constitutional: He is oriented to person, place, and time and well-developed, well-nourished, and in no distress.  HENT:  Head: Normocephalic and atraumatic.  Eyes: Pupils are equal, round, and reactive to light.  Cardiovascular: Normal rate and intact distal pulses.  Pulmonary/Chest: Effort normal. No respiratory distress.  Abdominal: Soft. Bowel sounds are normal. He exhibits no distension. There is no abdominal tenderness.  Genitourinary:    Genitourinary Comments: Exam deferred; no external genitalia symptoms. No bleeding or discharge.    Neurological: He is alert and oriented to person, place, and time. Gait normal.  Skin: Skin is warm and dry. No rash noted. He is not diaphoretic.  Psychiatric: Mood, memory, affect and judgment normal.  Nursing note and vitals reviewed.   Urgent Care Treatments / Results:   Orders Placed This Encounter  Procedures  . Urine culture  . Urinalysis, Complete w Microscopic    LABS: PLEASE NOTE: all labs that were ordered this encounter are listed, however only abnormal results are displayed. Labs Reviewed  URINALYSIS, COMPLETE (UACMP) WITH MICROSCOPIC - Abnormal; Notable for the following components:      Result Value   Protein, ur 100 (*)    Bacteria, UA FEW (*)    All other components within normal limits  URINE CULTURE    EKG: -None  RADIOLOGY: No results found.  PROCEDURES: Procedures  MEDICATIONS RECEIVED THIS VISIT: Medications - No data to display  PERTINENT CLINICAL COURSE NOTES/UPDATES:   Initial Impression / Assessment and Plan / Urgent Care Course:  Pertinent labs & imaging results that were available during my care of the patient were personally reviewed by me and considered in my medical decision making (see lab/imaging section of note for values and interpretations).  NASIM WALKER is a 79 y.o. male who presents to [facility] today with complaints  of Dysuria   Patient is well appearing overall in clinic today. He does not appear to be in any acute distress. Presenting symptoms (see HPI) and exam as documented above. UA was (+) for infection; reflex culture sent. Will treat with a 3 day course of SMZ-TMP DS. Patient encouraged to complete the entire course of antibiotics even if he begins to feel better. He was advised that if culture demonstrates resistance to the prescribed antibiotic, he will be contacted and advised of the need to change the antibiotic being used to treat his infection. Patient encouraged to increase his fluid intake as much as possible. Discussed that water is always best to flush the urinary tract. He was advised to avoid caffeine containing fluids until his infections clears, as caffeine can cause him to experience painful bladder spasms. May use Tylenol and/or Ibuprofen as needed for pain/fever.  Discussed follow up with primary care physician in 1 week for re-evaluation. I have reviewed the follow up and strict return precautions for any new or worsening symptoms. Patient is aware of symptoms that would be deemed urgent/emergent, and would  thus require further evaluation either here or in the emergency department. At the time of discharge, he verbalized understanding and consent with the discharge plan as it was reviewed with him. All questions were fielded by provider and/or clinic staff prior to patient discharge.    Final Clinical Impressions / Urgent Care Diagnoses:   Final diagnoses:  Dysuria    New Prescriptions:  [place] Controlled Substance Registry consulted? Not Applicable  Meds ordered this encounter  Medications  . sulfamethoxazole-trimethoprim (BACTRIM DS) 800-160 MG tablet    Sig: Take 1 tablet by mouth 2 (two) times daily for 3 days.    Dispense:  6 tablet    Refill:  0    Recommended Follow up Care:  Patient encouraged to follow up with the following provider within the specified time frame, or  sooner as dictated by the severity of his symptoms. As always, he was instructed that for any urgent/emergent care needs, he should seek care either here or in the emergency department for more immediate evaluation.  Follow-up Information    Jerrol Banana., MD In 1 week.   Specialty: Family Medicine Why: General reassessment of symptoms if not improving Contact information: [address] [place] [address] [phone]         NOTE: This note was prepared using Dragon dictation software along with smaller phrase technology. Despite my best ability to proofread, there is the potential that transcriptional errors may still occur from this process, and are completely unintentional.    Karen Kitchens, NP 06/22/19 [phone]

## 2019-06-21 NOTE — Discharge Instructions (Signed)
It was very nice seeing you today in clinic. Thank you for entrusting me with your care.  ° °As discussed, your urine is POSITIVE for infection. Will approach treatment as follows: ° °Prescription has been sent to your pharmacy for antibiotics.  °Please pick up and take as directed. FINISH the entire course of medication even if you are feeling better.  °A culture will be sent on your provided sample. If it comes back resistant to what I have prescribed you, someone will call you and let you know that we will need to change antibiotics. °Increase fluid intake as much as possible to flush your urinary tract.  °Water is always the best.  °Avoid caffeine until your infection clears up, as it can contribute to painful bladder spasms.  °May use Tylenol and/or Ibuprofen as needed for pain/fever. ° °Make arrangements to follow up with your regular doctor in 1 week for re-evaluation. If your symptoms/condition worsens, please seek follow up care either here or in the ER. Please remember, our [place] providers are "right here with you" when you need us.  ° °Again, it was my pleasure to take care of you today. Thank you for choosing our clinic. I hope that you start to feel better quickly.  ° °Eldean Klatt, MSN, APRN, FNP-C, CEN °Advanced Practice Provider °[facility] ° °

## 2019-06-22 LAB — URINE CULTURE: Culture: NO GROWTH

## 2019-06-28 DIAGNOSIS — Z961 Presence of intraocular lens: Secondary | ICD-10-CM | POA: Diagnosis not present

## 2019-06-30 ENCOUNTER — Other Ambulatory Visit: Payer: Self-pay

## 2019-06-30 ENCOUNTER — Encounter: Payer: Self-pay | Admitting: Adult Health

## 2019-06-30 ENCOUNTER — Ambulatory Visit (INDEPENDENT_AMBULATORY_CARE_PROVIDER_SITE_OTHER): Payer: Medicare Other | Admitting: Adult Health

## 2019-06-30 VITALS — BP 136/86 | HR 74 | Temp 97.0°F | Resp 16 | Wt 193.8 lb

## 2019-06-30 DIAGNOSIS — D519 Vitamin B12 deficiency anemia, unspecified: Secondary | ICD-10-CM | POA: Diagnosis not present

## 2019-06-30 DIAGNOSIS — R300 Dysuria: Secondary | ICD-10-CM

## 2019-06-30 DIAGNOSIS — Z125 Encounter for screening for malignant neoplasm of prostate: Secondary | ICD-10-CM

## 2019-06-30 DIAGNOSIS — R718 Other abnormality of red blood cells: Secondary | ICD-10-CM | POA: Diagnosis not present

## 2019-06-30 DIAGNOSIS — E538 Deficiency of other specified B group vitamins: Secondary | ICD-10-CM | POA: Diagnosis not present

## 2019-06-30 DIAGNOSIS — R350 Frequency of micturition: Secondary | ICD-10-CM | POA: Diagnosis not present

## 2019-06-30 LAB — POCT URINALYSIS DIPSTICK
Bilirubin, UA: NEGATIVE
Blood, UA: NEGATIVE
Glucose, UA: NEGATIVE
Ketones, UA: NEGATIVE
Leukocytes, UA: NEGATIVE
Nitrite, UA: NEGATIVE
Protein, UA: NEGATIVE
Spec Grav, UA: <=1.005 — AB (ref 1.010–1.025)
Urobilinogen, UA: 1 E.U./dL
pH, UA: 5 (ref 5.0–8.0)

## 2019-06-30 NOTE — Progress Notes (Signed)
Patient: Zachary [name] Male    DOB: 08/08/1940   79 y.o.   MRN: [id number] Visit Date: 06/30/2019  Today's Provider: Marcille Buffy, FNP   Chief Complaint  Patient presents with  . Follow-up    Dysuria   Subjective:     HPI Patient returns to clinic today for follow up visit after being seen at [facility] on 06/21/19 with complaints of dysuria x 4days that was gradually getting worse. Urinalysis performed at urgent care showed protein and bacteria in urine, it was sent for culture. Urine culture showed no growth, patient states that he was prescribed SMZ/TMP DS 800-160mg  and reports good compliance and tolerance on medication. Patient reports symptom improvement with antibiotic and denies dysuria but states that he still has soreness with urination at times.Denies any penile sores or irritation.  He has some urinary frequency more in the evening. Urgency occasionally. Denies any history of prostate symptoms. Denies any back or abdominal pain.   Denies any urinary symptoms. Denies any itching.  Denies any blood.  Denies any rectal pressure or pain.   Patient  denies any fever, body aches,chills, rash, chest pain, shortness of breath, nausea, vomiting, or diarrhea.     No Known Allergies   Current Outpatient Medications:  .  aspirin EC 81 MG tablet, Take 81 mg by mouth at bedtime., Disp: , Rfl:  .  atorvastatin (LIPITOR) 20 MG tablet, TAKE 1 TABLET BY MOUTH AT BEDTIME, Disp: 90 tablet, Rfl: 3 .  lisinopril (PRINIVIL,ZESTRIL) 40 MG tablet, TAKE 1 TABLET BY MOUTH ONCE DAILY (Patient taking differently: Take 1/2 tablet daily.), Disp: 90 tablet, Rfl: 3 .  meclizine (ANTIVERT) 25 MG tablet, Take 1 tablet (25 mg total) by mouth 3 (three) times daily as needed for dizziness., Disp: 30 tablet, Rfl: 0  Review of Systems  Constitutional: Negative.   HENT: Negative.   Respiratory: Negative.   Cardiovascular: Negative.   Gastrointestinal: Negative.    Genitourinary: Positive for frequency (in the PM) and urgency. Negative for decreased urine volume, difficulty urinating, discharge, dysuria, enuresis, flank pain, genital sores, hematuria, penile pain, penile swelling, scrotal swelling and testicular pain.  Musculoskeletal: Negative.   Skin: Negative.   Neurological: Negative.   Hematological: Negative.   Psychiatric/Behavioral: Negative.     Social History   Tobacco Use  . Smoking status: Never Smoker  . Smokeless tobacco: Never Used  Substance Use Topics  . Alcohol use: No    Alcohol/week: 0.0 standard drinks      Objective:   BP 136/86   Pulse 74   Temp (!) 97 F (36.1 C) (Oral)   Resp 16   Wt 193 lb 12.8 oz (87.9 kg)   SpO2 97%   BMI 24.55 kg/m  Vitals:   06/30/19 0821  BP: 136/86  Pulse: 74  Resp: 16  Temp: (!) 97 F (36.1 C)  TempSrc: Oral  SpO2: 97%  Weight: 193 lb 12.8 oz (87.9 kg)  Body mass index is 24.55 kg/m.   Physical Exam Vitals reviewed.  Constitutional:      General: He is not in acute distress.    Appearance: Normal appearance. He is not ill-appearing, toxic-appearing or diaphoretic.  HENT:     Head: Normocephalic and atraumatic.  Eyes:     Conjunctiva/sclera: Conjunctivae normal.     Pupils: Pupils are equal, round, and reactive to light.  Cardiovascular:     Rate and Rhythm: Normal rate and regular rhythm.  Heart sounds: Normal heart sounds.  Abdominal:     General: Abdomen is flat. Bowel sounds are normal. There is no distension.     Palpations: Abdomen is soft. There is no mass.     Tenderness: There is no abdominal tenderness. There is no right CVA tenderness, left CVA tenderness, guarding or rebound.     Hernia: No hernia is present.  Musculoskeletal:     Cervical back: Normal range of motion and neck supple.  Skin:    General: Skin is warm.     Capillary Refill: Capillary refill takes less than 2 seconds.     Findings: No rash.  Neurological:     Mental Status: He is  alert and oriented to person, place, and time.     Cranial Nerves: No cranial nerve deficit.     Sensory: No sensory deficit.     Motor: No weakness.     Coordination: Coordination normal.     Gait: Gait normal.     Deep Tendon Reflexes: Reflexes normal.  Psychiatric:        Mood and Affect: Mood normal.        Behavior: Behavior normal.        Thought Content: Thought content normal.        Judgment: Judgment normal.      Results for orders placed or performed in visit on 06/30/19  POCT urinalysis dipstick  Result Value Ref Range   Color, UA yellow    Clarity, UA clear    Glucose, UA Negative Negative   Bilirubin, UA negative    Ketones, UA negative    Spec Grav, UA <=1.005 (A) 1.010 - 1.025   Blood, UA negative    pH, UA 5.0 5.0 - 8.0   Protein, UA Negative Negative   Urobilinogen, UA 1.0 0.2 or 1.0 E.U./dL   Nitrite, UA negative    Leukocytes, UA Negative Negative   Appearance     Odor         Assessment & Plan    Dysuria - Plan: POCT urinalysis dipstick, CBC with Differential/Platelet, Comprehensive Metabolic Panel (CMET), PSA  Screening for prostate cancer  Urinary frequency - Plan: PSA  Urine dipstick normal.  Symptoms have improved, still has soreness feeling when urinating. Denies any yeast symptoms.  No rash reported. Will check Kidney function, CBC and PSA given recent urinary symptoms. Increase hydration with clear fluids. Notify office if soreness continues or if any other symptoms return or new symptoms develop.   Advised patient call the office or your primary care doctor for an appointment if no improvement within 72 hours or if any symptoms change or worsen at any time  Advised ER or urgent Care if after hours or on weekend. Call 911 for emergency symptoms at any time.Patinet verbalized understanding of all instructions given/reviewed and treatment plan and has no further questions or concerns at this time.     The entirety of the information  documented in the History of Present Illness, Review of Systems and Physical Exam were personally obtained by me. Portions of this information were initially documented by the  Certified Medical Assistant whose name is documented in [place] and reviewed by me for thoroughness and accuracy.  I have personally performed the exam and reviewed the chart and it is accurate to the best of my knowledge.  Haematologist has been used and any errors in dictation or transcription are unintentional.  Kelby Aline. [place]  [place], [facility]

## 2019-06-30 NOTE — Patient Instructions (Signed)

## 2019-07-01 LAB — COMPREHENSIVE METABOLIC PANEL
ALT: 12 IU/L (ref 0–44)
AST: 27 IU/L (ref 0–40)
Albumin/Globulin Ratio: 2 (ref 1.2–2.2)
Albumin: 4.1 g/dL (ref 3.7–4.7)
Alkaline Phosphatase: 80 IU/L (ref 39–117)
BUN/Creatinine Ratio: 10 (ref 10–24)
BUN: 13 mg/dL (ref 8–27)
Bilirubin Total: 0.6 mg/dL (ref 0.0–1.2)
CO2: 20 mmol/L (ref 20–29)
Calcium: 9.2 mg/dL (ref 8.6–10.2)
Chloride: 109 mmol/L — ABNORMAL HIGH (ref 96–106)
Creatinine, Ser: 1.29 mg/dL — ABNORMAL HIGH (ref 0.76–1.27)
GFR calc Af Amer: 62 mL/min/{1.73_m2} (ref >59)
GFR calc non Af Amer: 53 mL/min/{1.73_m2} — ABNORMAL LOW (ref >59)
Globulin, Total: 2.1 g/dL (ref 1.5–4.5)
Glucose: 82 mg/dL (ref 65–99)
Potassium: 4.2 mmol/L (ref 3.5–5.2)
Sodium: 142 mmol/L (ref 134–144)
Total Protein: 6.2 g/dL (ref 6.0–8.5)

## 2019-07-01 LAB — CBC WITH DIFFERENTIAL/PLATELET
Basophils Absolute: 0 10*3/uL (ref 0.0–0.2)
Basos: 1 %
EOS (ABSOLUTE): 0.1 10*3/uL (ref 0.0–0.4)
Eos: 4 %
Hematocrit: 39.9 % (ref 37.5–51.0)
Hemoglobin: 13.4 g/dL (ref 13.0–17.7)
Immature Grans (Abs): 0 10*3/uL (ref 0.0–0.1)
Immature Granulocytes: 0 %
Lymphocytes Absolute: 0.6 10*3/uL — ABNORMAL LOW (ref 0.7–3.1)
Lymphs: 17 %
MCH: 33.1 pg — ABNORMAL HIGH (ref 26.6–33.0)
MCHC: 33.6 g/dL (ref 31.5–35.7)
MCV: 99 fL — ABNORMAL HIGH (ref 79–97)
Monocytes Absolute: 0.3 10*3/uL (ref 0.1–0.9)
Monocytes: 9 %
Neutrophils Absolute: 2.4 10*3/uL (ref 1.4–7.0)
Neutrophils: 69 %
Platelets: 198 10*3/uL (ref 150–450)
RBC: 4.05 x10E6/uL — ABNORMAL LOW (ref 4.14–5.80)
RDW: 12 % (ref 11.6–15.4)
WBC: 3.5 10*3/uL (ref 3.4–10.8)

## 2019-07-01 LAB — PSA: Prostate Specific Ag, Serum: 1.4 ng/mL (ref 0.0–4.0)

## 2019-07-02 ENCOUNTER — Telehealth: Payer: Self-pay | Admitting: Adult Health

## 2019-07-02 DIAGNOSIS — E538 Deficiency of other specified B group vitamins: Secondary | ICD-10-CM

## 2019-07-02 LAB — SPECIMEN STATUS REPORT

## 2019-07-02 LAB — VITAMIN B12: Vitamin B-12: 152 pg/mL — ABNORMAL LOW (ref 232–1245)

## 2019-07-02 MED ORDER — VITAMIN B-12 500 MCG SL SUBL: 1.0000 | SUBLINGUAL_TABLET | Freq: Every day | SUBLINGUAL | 0 refills | Status: DC

## 2019-07-02 NOTE — Telephone Encounter (Addendum)
Nunzio Cory can you see if intrinsic factor can be added to labs please. Thanks   Doreen Beam, FNP  07/02/2019 2:21 PM EST    Vitamin B12 is low at 152. Mild anemia likely from B 12 deficiency. We are adding on a antibody test to be sure his body is absorbing vitamin b12. Prostate level is normal. Kidney function slightly decreased from last check, suspect dehydration, avoid NSAID's and increase clear liquids and hydration. We will call once antibody test is back.  I have sent Vitamin B12 to his pharmacy take one 500 mcg tablet sublingual daily

## 2019-07-02 NOTE — Addendum Note (Signed)
Addended by: Doreen Beam on: 07/02/2019 02:13 PM   Modules accepted: Orders

## 2019-07-02 NOTE — Progress Notes (Signed)
  Vitamin B12 is low at 152. Mild anemia likely from B 12 deficiency. We are adding on a antibody test to be sure his body is absorbing vitamin b12. Prostate level is normal. Kidney function slightly decreased from last check, suspect dehydration, avoid NSAID's and increase clear liquids and hydration. We will call once antibody test is back.  I have sent Vitamin B12 to his pharmacy take one 500 mcg tablet sublingual daily.

## 2019-07-02 NOTE — Addendum Note (Signed)
Addended by: Doreen Beam on: 07/02/2019 02:21 PM   Modules accepted: Orders

## 2019-07-06 LAB — INTRINSIC FACTOR ANTIBODIES: Intrinsic Factor Abs, Serum: 1 AU/mL (ref 0.0–1.1)

## 2019-07-06 LAB — SPECIMEN STATUS REPORT

## 2019-07-07 NOTE — Progress Notes (Signed)
Oral B12 supplementation should suffice.

## 2019-07-19 ENCOUNTER — Telehealth: Payer: Self-pay

## 2019-07-19 ENCOUNTER — Encounter: Payer: Self-pay | Admitting: Adult Health

## 2019-07-19 ENCOUNTER — Other Ambulatory Visit: Payer: Self-pay

## 2019-07-19 ENCOUNTER — Ambulatory Visit (INDEPENDENT_AMBULATORY_CARE_PROVIDER_SITE_OTHER): Payer: Medicare Other | Admitting: Adult Health

## 2019-07-19 ENCOUNTER — Ambulatory Visit
Admission: RE | Admit: 2019-07-19 | Discharge: 2019-07-19 | Disposition: A | Discharge status: Home / Self Care | Payer: Medicare Other | Source: Ambulatory Visit | Attending: Adult Health | Admitting: Adult Health

## 2019-07-19 VITALS — BP 122/82 | HR 69 | Temp 96.8°F | Resp 16 | Wt 195.0 lb

## 2019-07-19 DIAGNOSIS — R600 Localized edema: Secondary | ICD-10-CM | POA: Insufficient documentation

## 2019-07-19 NOTE — Telephone Encounter (Signed)
Copied from [place] [id number]. Topic: General - Other >> Jul 19, 2019  2:32 PM Keene Breath wrote: Reason for CRM: Patient would like to get more information regarding his appt. In [place] today at 3pm.  Please call patient back asap

## 2019-07-19 NOTE — Telephone Encounter (Signed)
Complete

## 2019-07-19 NOTE — Patient Instructions (Signed)
Peripheral Edema  Peripheral edema is swelling that is caused by a buildup of fluid. Peripheral edema most often affects the lower legs, ankles, and feet. It can also develop in the arms, hands, and face. The area of the body that has peripheral edema will look swollen. It may also feel heavy or warm. Your clothes may start to feel tight. Pressing on the area may make a temporary dent in your skin. You may not be able to move your swollen arm or leg as much as usual. There are many causes of peripheral edema. It can happen because of a complication of other conditions such as congestive heart failure, kidney disease, or a problem with your blood circulation. It also can be a side effect of certain medicines or because of an infection. It often happens to women during pregnancy. Sometimes, the cause is not known. Follow these instructions at home: Managing pain, stiffness, and swelling   Raise (elevate) your legs while you are sitting or lying down.  Move around often to prevent stiffness and to lessen swelling.  Do not sit or stand for long periods of time.  Wear support stockings as told by your health care provider. Medicines  Take over-the-counter and prescription medicines only as told by your health care provider.  Your health care provider may prescribe medicine to help your body get rid of excess water (diuretic). General instructions  Pay attention to any changes in your symptoms.  Follow instructions from your health care provider about limiting salt (sodium) in your diet. Sometimes, eating less salt may reduce swelling.  Moisturize skin daily to help prevent skin from cracking and draining.  Keep all follow-up visits as told by your health care provider. This is important. Contact a health care provider if you have:  A fever.  Edema that starts suddenly or is getting worse, especially if you are pregnant or have a medical condition.  Swelling in only one leg.  Increased  swelling, redness, or pain in one or both of your legs.  Drainage or sores at the area where you have edema. Get help right away if you:  Develop shortness of breath, especially when you are lying down.  Have pain in your chest or abdomen.  Feel weak.  Feel faint. Summary  Peripheral edema is swelling that is caused by a buildup of fluid. Peripheral edema most often affects the lower legs, ankles, and feet.  Move around often to prevent stiffness and to lessen swelling. Do not sit or stand for long periods of time.  Pay attention to any changes in your symptoms.  Contact a health care provider if you have edema that starts suddenly or is getting worse, especially if you are pregnant or have a medical condition.  Get help right away if you develop shortness of breath, especially when lying down. This information is not intended to replace advice given to you by your health care provider. Make sure you discuss any questions you have with your health care provider. Document Revised: 02/11/2018 Document Reviewed: 02/11/2018 Elsevier Patient Education  2020 Elsevier Inc.  

## 2019-07-19 NOTE — Progress Notes (Signed)
Patient: Zachary [name] Male    DOB: 1941-04-12   79 y.o.   MRN: [id number] Visit Date: 07/19/2019  Today's Provider: Marcille Buffy, FNP   Chief Complaint  Patient presents with  . Leg Swelling   Subjective:     Leg Pain  The incident occurred 3 to 5 days ago (3 days notoiced it putting on his pjs ). Incident location: no injury  Injury mechanism: no injury  The pain is present in the right ankle and right leg. Quality: no pain  The pain is at a severity of 0/10. The patient is experiencing no pain. Pertinent negatives include no inability to bear weight, loss of motion, loss of sensation, muscle weakness, numbness or tingling. He reports no foreign bodies present. Nothing aggravates the symptoms. He has tried nothing for the symptoms. The treatment provided no relief.    Patients comes in office today with concern of swelling in the right lower leg and ankle for the past 4 days. Patient denies pain or increase in sodium in his diet.  Denies any injury, pain or trauma.  He has gained about 5 lbs when not mowing yards has been less active. .  Right ankle swelling x 3 days. Denies leg pain, or knee pain.  Denies any recent  Surgeries or flights.  Denies any shortness of breath.  He does drink diet cokes and water.  History of hypertension, taking Lisinopril 40 mg ( 1/2 tablet daily). No history of diuretics.  Does have history of large right pneumothorax in 2016 was treated chest tube and re-expansion was successful.  Denies any chest pain or respiratory symptoms.  Patient  denies any fever, body aches,chills, rash, chest pain, shortness of breath, nausea, vomiting, or diarrhea.  Denies any gait difficulties.   Filed Weights   07/19/19 1105  Weight: 195 lb (88.5 kg)   193 lbs at last visit on 06/30/2019.    No Known Allergies   Current Outpatient Medications:  .  aspirin EC 81 MG tablet, Take 81 mg by mouth at bedtime., Disp: , Rfl:  .  atorvastatin  (LIPITOR) 20 MG tablet, TAKE 1 TABLET BY MOUTH AT BEDTIME, Disp: 90 tablet, Rfl: 3 .  Cyanocobalamin (VITAMIN B-12) 500 MCG SUBL, Place 1 tablet (500 mcg total) under the tongue daily., Disp: 90 tablet, Rfl: 0 .  lisinopril (PRINIVIL,ZESTRIL) 40 MG tablet, TAKE 1 TABLET BY MOUTH ONCE DAILY (Patient taking differently: Take 1/2 tablet daily.), Disp: 90 tablet, Rfl: 3 .  meclizine (ANTIVERT) 25 MG tablet, Take 1 tablet (25 mg total) by mouth 3 (three) times daily as needed for dizziness., Disp: 30 tablet, Rfl: 0  Review of Systems  Constitutional: Negative for activity change, appetite change, chills, diaphoresis, fatigue, fever and unexpected weight change (nothing out of the normal for him he reports he usually gains arounf 5 lbs in the  winter when he is not actively outside. ).  HENT: Negative.   Respiratory: Negative.  Negative for apnea, cough, choking, chest tightness, shortness of breath, wheezing and stridor.   Cardiovascular: Positive for leg swelling. Negative for chest pain and palpitations.  Gastrointestinal: Negative.   Genitourinary: Negative.        He reports he is urinating normally, he occasionally has a stop in his stream but is able to continue urination. Denies any decreased amount of urine. Denies dysuria.  PSA was normal.   Musculoskeletal: Positive for joint swelling (right ankle . denies trauma, twisting or injury. ).  Negative for arthralgias, back pain, gait problem, myalgias, neck pain and neck stiffness.  Skin: Negative.   Neurological: Negative for tingling and numbness.  Hematological: Negative.   Psychiatric/Behavioral: Negative.   He denies any pain.   Social History   Tobacco Use  . Smoking status: Never Smoker  . Smokeless tobacco: Never Used  Substance Use Topics  . Alcohol use: No    Alcohol/week: 0.0 standard drinks      Objective:   BP 122/82   Pulse 69   Temp (!) 96.8 F (36 C) (Oral)   Resp 16   Wt 195 lb (88.5 kg)   SpO2 98%   BMI 24.70  kg/m  Vitals:   07/19/19 1105  BP: 122/82  Pulse: 69  Resp: 16  Temp: (!) 96.8 F (36 C)  TempSrc: Oral  SpO2: 98%  Weight: 195 lb (88.5 kg)  Body mass index is 24.7 kg/m.   Physical Exam Vitals reviewed.  Constitutional:      General: He is not in acute distress.    Appearance: Normal appearance. He is not ill-appearing, toxic-appearing or diaphoretic.  HENT:     Head: Normocephalic and atraumatic.     Right Ear: Tympanic membrane normal. There is no impacted cerumen.     Left Ear: Tympanic membrane normal. There is no impacted cerumen.     Nose: Nose normal. No congestion or rhinorrhea.     Mouth/Throat:     Mouth: Mucous membranes are moist.     Pharynx: No oropharyngeal exudate or posterior oropharyngeal erythema.  Eyes:     Pupils: Pupils are equal, round, and reactive to light.  Neck:     Vascular: No carotid bruit, hepatojugular reflux or JVD.     Trachea: Trachea and phonation normal.     Meningeal: Brudzinski's sign and Kernig's sign absent.  Cardiovascular:     Rate and Rhythm: Normal rate and regular rhythm.  No extrasystoles are present.    Chest Wall: PMI is not displaced. No thrill.     Pulses:          Popliteal pulses are 2+ on the right side and 2+ on the left side.       Dorsalis pedis pulses are 1+ on the right side and 1+ on the left side.       Posterior tibial pulses are 1+ on the right side and 1+ on the left side.     Heart sounds: Normal heart sounds. Heart sounds not distant. No murmur. No friction rub. No gallop. No S3 or S4 sounds.      Comments: No rash. No erythema. Mild right calf edema. 2+ pitting edema to right ankle.  No erythema. No visualized varicosities. Negative homans sign. No appreciable warmth, however can not exclude possible posterior calf warmness.   Pulmonary:     Effort: Pulmonary effort is normal. No respiratory distress.     Breath sounds: Normal breath sounds and air entry. No stridor. No wheezing, rhonchi or rales.    Chest:     Chest wall: No tenderness.     Comments: No adventitious lung sounds  Abdominal:     General: There is no distension.     Palpations: Abdomen is soft. There is no mass.     Tenderness: There is no abdominal tenderness. There is no right CVA tenderness, left CVA tenderness, guarding or rebound.     Hernia: No hernia is present.  Musculoskeletal:        General: Swelling  present. No tenderness, deformity or signs of injury.     Cervical back: Full passive range of motion without pain, normal range of motion and neck supple. No rigidity or tenderness.     Right lower leg: Swelling present. No deformity, lacerations, tenderness or bony tenderness. Edema (ankle 2 + pitting edema over medial malleolous / no bruising / no pain on exam. / no calf swelling. ) present.     Left lower leg: No swelling, deformity, lacerations, tenderness or bony tenderness. No edema.     Right ankle: Swelling present. No deformity, ecchymosis or lacerations. No tenderness. Normal range of motion. Normal pulse.     Right Achilles Tendon: Normal.     Left ankle: No swelling, deformity, ecchymosis or lacerations. No tenderness. Normal range of motion. Normal pulse.     Left Achilles Tendon: Normal.     Right foot: Normal.     Left foot: Normal.     Comments: Negative homans sign.   Lymphadenopathy:     Cervical: No cervical adenopathy.  Skin:    Capillary Refill: Capillary refill takes less than 2 seconds.     Coloration: Skin is not jaundiced or pale.     Findings: No bruising, erythema, lesion or rash.  Neurological:     Mental Status: He is alert and oriented to person, place, and time. Mental status is at baseline.     Cranial Nerves: No cranial nerve deficit.     Motor: No weakness.     Coordination: Coordination normal.     Gait: Gait normal.     Deep Tendon Reflexes: Reflexes normal.  Psychiatric:        Mood and Affect: Mood normal.        Behavior: Behavior normal.        Thought Content:  Thought content normal.        Judgment: Judgment normal.      No results found for any visits on 07/19/19.     Assessment & Plan     .Edema of right lower leg - Plan: POCT Urinalysis Dip Manual, B Nat Peptide, Comprehensive Metabolic Panel (CMET), US Venous Img Lower Unilateral Right (DVT), DG Chest 2 View  Will do DVT ultrasound right leg  to rule out DVT now -   low suspicion with WELL's score. No breathing difficulties or shortness of breath. No respiratory or cardiac symptoms.  Very mild  warmth to posterior lower leg on exam. Negative Homans.  Chest x ray and BNP rule out any CHF component, exam was normal of lungs and cardiac.  He is on ASA 81 mg.  Decrease sodium in diet, elevate leg.  Emergent RED Flag symptoms dicussed and when to seek immediate medical attention at emergency room. Patient verbalized understanding of all instructions given and denies any further questions at this time.    Return in about 1 week (around 07/26/2019), or if symptoms worsen or fail to improve, for at any time for any worsening symptoms, Go to Emergency room/ urgent care if worse.    The entirety of the information documented in the History of Present Illness, Review of Systems and Physical Exam were personally obtained by me. Portions of this information were initially documented by the  Certified Medical Assistant whose name is documented in [place] and reviewed by me for thoroughness and accuracy.  I have personally performed the exam and reviewed the chart and it is accurate to the best of my knowledge.  Haematologist has been used  and any errors in dictation or transcription are unintentional.  Kelby Aline. [place], [facility]

## 2019-07-20 LAB — COMPREHENSIVE METABOLIC PANEL
ALT: 15 IU/L (ref 0–44)
AST: 23 IU/L (ref 0–40)
Albumin/Globulin Ratio: 1.9 (ref 1.2–2.2)
Albumin: 4 g/dL (ref 3.7–4.7)
Alkaline Phosphatase: 72 IU/L (ref 39–117)
BUN/Creatinine Ratio: 9 — ABNORMAL LOW (ref 10–24)
BUN: 10 mg/dL (ref 8–27)
Bilirubin Total: 0.7 mg/dL (ref 0.0–1.2)
CO2: 20 mmol/L (ref 20–29)
Calcium: 9.1 mg/dL (ref 8.6–10.2)
Chloride: 109 mmol/L — ABNORMAL HIGH (ref 96–106)
Creatinine, Ser: 1.16 mg/dL (ref 0.76–1.27)
GFR calc Af Amer: 70 mL/min/{1.73_m2} (ref >59)
GFR calc non Af Amer: 61 mL/min/{1.73_m2} (ref >59)
Globulin, Total: 2.1 g/dL (ref 1.5–4.5)
Glucose: 83 mg/dL (ref 65–99)
Potassium: 4.2 mmol/L (ref 3.5–5.2)
Sodium: 140 mmol/L (ref 134–144)
Total Protein: 6.1 g/dL (ref 6.0–8.5)

## 2019-07-20 LAB — BRAIN NATRIURETIC PEPTIDE: BNP: 252.1 pg/mL — ABNORMAL HIGH (ref 0.0–100.0)

## 2019-07-20 NOTE — Progress Notes (Signed)
(  Nunzio Cory- was having connection issues this am calling him- sent [facility] message as well if he returns call)   Your ultrasound of right leg showed no blood clot, however it did show that you have a cyst behind your knee that is consistent with a Baker's Cyst  this could be contributing to your your ankle swelling.  I would like for you to Apply a compressive ACE bandage. Rest and elevate the affected  area.  Apply cold compresses intermittently as needed.  As swelling goes away, begin normal activities slowly as tolerated.    Call if you develop any pain, redness, increased swelling, color change  or worsening symptoms. We will see you again at your 1 week follow up and sooner if needed.  Also your kidney function from yesterday's labs is normal.

## 2019-07-21 ENCOUNTER — Other Ambulatory Visit: Payer: Self-pay

## 2019-07-21 ENCOUNTER — Ambulatory Visit
Admission: RE | Admit: 2019-07-21 | Discharge: 2019-07-21 | Disposition: A | Discharge status: Home / Self Care | Payer: Medicare Other | Attending: Adult Health | Admitting: Adult Health

## 2019-07-21 ENCOUNTER — Telehealth: Payer: Self-pay

## 2019-07-21 ENCOUNTER — Ambulatory Visit
Admission: RE | Admit: 2019-07-21 | Discharge: 2019-07-21 | Disposition: A | Discharge status: Home / Self Care | Payer: Medicare Other | Source: Ambulatory Visit | Attending: Adult Health | Admitting: Adult Health

## 2019-07-21 DIAGNOSIS — R600 Localized edema: Secondary | ICD-10-CM | POA: Diagnosis not present

## 2019-07-21 DIAGNOSIS — I517 Cardiomegaly: Secondary | ICD-10-CM | POA: Diagnosis not present

## 2019-07-21 NOTE — Telephone Encounter (Signed)
Copied from [place] [id number]. Topic: General - Inquiry >> Jul 21, 2019  1:36 PM Alease Frame wrote: Reason for CRM: Patient is needing more information on upcoming appt he would like someone from the office to reach out to him . Please advise

## 2019-07-21 NOTE — Telephone Encounter (Signed)
Spoke with patient and advised him to go to outpatient imaging for xray that Sharyn Lull had ordered. KW

## 2019-07-22 NOTE — Progress Notes (Signed)
No significant change from previous x ray.Hiatal hernia and mild cardiomegaly still present. Follow up as discussed.

## 2019-07-23 ENCOUNTER — Ambulatory Visit (INDEPENDENT_AMBULATORY_CARE_PROVIDER_SITE_OTHER): Payer: Medicare Other | Admitting: Physician Assistant

## 2019-07-23 ENCOUNTER — Other Ambulatory Visit: Payer: Self-pay

## 2019-07-23 ENCOUNTER — Encounter: Payer: Self-pay | Admitting: Physician Assistant

## 2019-07-23 VITALS — BP 136/76 | HR 77 | Temp 96.9°F | Wt 193.6 lb

## 2019-07-23 DIAGNOSIS — R600 Localized edema: Secondary | ICD-10-CM

## 2019-07-23 MED ORDER — FUROSEMIDE 20 MG PO TABS: 20.0000 mg | ORAL_TABLET | Freq: Every day | ORAL | 0 refills | Status: DC

## 2019-07-23 NOTE — Progress Notes (Signed)
Patient: Zachary [name] Male    DOB: 02-14-1941   79 y.o.   MRN: [id number] Visit Date: 07/23/2019  Today's Provider: Trinna Post, PA-C   Chief Complaint  Patient presents with  . Leg Swelling   Subjective:     HPI  Leg Swelling Patient presents today for right leg and ankle swelling for about 1 week now. Patient reports no pain and states that he is able to bear weight. Patient reports he seen Orma Flaming on 07/19/2019 and she sent him to have a US done. [place] was negative for blood clot on 07/19/2019. He had a Cmet which was unremarkable and a BNP which is in normal range for his age. CXR showed mild cardiomegaly but otherwise unrevealing. Patient states Orma Flaming advised him to elevate his leg and try to stay off of the leg as much as possible. He is doing so and does report that his leg is less swollen in the morning when he wakes up. He is not using compression stockings. He denies chest pain, palpitations, and SOB.   CMP Latest Ref Rng & Units 07/19/2019 06/30/2019 11/04/2018  Glucose 65 - 99 mg/dL 83 82 84  BUN 8 - 27 mg/dL 10 13 17   Creatinine 0.76 - 1.27 mg/dL 1.16 1.29(H) 1.25  Sodium 134 - 144 mmol/L 140 142 143  Potassium 3.5 - 5.2 mmol/L 4.2 4.2 4.9  Chloride 96 - 106 mmol/L 109(H) 109(H) 108(H)  CO2 20 - 29 mmol/L 20 20 21   Calcium 8.6 - 10.2 mg/dL 9.1 9.2 9.2  Total Protein 6.0 - 8.5 g/dL 6.1 6.2 6.0  Total Bilirubin 0.0 - 1.2 mg/dL 0.7 0.6 0.7  Alkaline Phos 39 - 117 IU/L 72 80 71  AST 0 - 40 IU/L 23 27 23   ALT 0 - 44 IU/L 15 12 12    BNP    Component Value Date/Time   BNP 252.1 (H) 07/19/2019 1157    DG Chest 2 View CLINICAL DATA:  Edema of right lower leg. Right lower leg swelling/edema. No respiratory symptoms rule out. Additional history provided by technologist: Right leg swelling from knee down for 1 week.  EXAM: CHEST - 2 VIEW  COMPARISON:  Chest radiograph 01/20/2017  FINDINGS: Mild cardiomegaly. Aortic  atherosclerosis. There is no evidence of airspace consolidation within the lungs. No evidence of pulmonary edema. No pleural effusion or pneumothorax. Moderate-sized hiatal hernia. No acute bony abnormality. Thoracic spondylosis.  IMPRESSION: Mild cardiomegaly.  Aortic atherosclerosis.  No airspace consolidation within the lungs or pulmonary edema.  Moderate-sized hiatal hernia.  Electronically Signed   By: Kellie Simmering DO   On: 07/22/2019 07:57       No Known Allergies   Current Outpatient Medications:  .  aspirin EC 81 MG tablet, Take 81 mg by mouth at bedtime., Disp: , Rfl:  .  atorvastatin (LIPITOR) 20 MG tablet, TAKE 1 TABLET BY MOUTH AT BEDTIME, Disp: 90 tablet, Rfl: 3 .  Cyanocobalamin (VITAMIN B-12) 500 MCG SUBL, Place 1 tablet (500 mcg total) under the tongue daily., Disp: 90 tablet, Rfl: 0 .  lisinopril (PRINIVIL,ZESTRIL) 40 MG tablet, TAKE 1 TABLET BY MOUTH ONCE DAILY (Patient taking differently: Take 1/2 tablet daily.), Disp: 90 tablet, Rfl: 3 .  meclizine (ANTIVERT) 25 MG tablet, Take 1 tablet (25 mg total) by mouth 3 (three) times daily as needed for dizziness., Disp: 30 tablet, Rfl: 0  Review of Systems  Constitutional: Negative.   HENT: Negative.   Respiratory:  Negative.   Cardiovascular: Positive for leg swelling.  Hematological: Negative.  Does not bruise/bleed easily.  Psychiatric/Behavioral: Negative.     Social History   Tobacco Use  . Smoking status: Never Smoker  . Smokeless tobacco: Never Used  Substance Use Topics  . Alcohol use: No    Alcohol/week: 0.0 standard drinks      Objective:   BP 136/76 (BP Location: Left Arm, Patient Position: Sitting, Cuff Size: Normal)   Pulse 77   Temp (!) 96.9 F (36.1 C) (Temporal)   Wt 193 lb 9.6 oz (87.8 kg)   SpO2 98%   BMI 24.52 kg/m  Vitals:   07/23/19 1011  BP: 136/76  Pulse: 77  Temp: (!) 96.9 F (36.1 C)  TempSrc: Temporal  SpO2: 98%  Weight: 193 lb 9.6 oz (87.8 kg)  Body mass  index is 24.52 kg/m.   Physical Exam Constitutional:      Appearance: Normal appearance.  Cardiovascular:     Rate and Rhythm: Normal rate and regular rhythm.     Heart sounds: Normal heart sounds.  Pulmonary:     Effort: Pulmonary effort is normal.     Breath sounds: Normal breath sounds.  Musculoskeletal:     Right lower leg: Edema present.     Left lower leg: No edema.     Comments: Mild nonpitting edema of RLE.   Skin:    General: Skin is warm and dry.  Neurological:     Mental Status: He is alert and oriented to person, place, and time. Mental status is at baseline.  Psychiatric:        Mood and Affect: Mood normal.        Behavior: Behavior normal.      No results found for any visits on 07/23/19.     Assessment & Plan    1. Leg edema, right  He has mild nonpitting edema of his foot, ankle and calf. His CXR shows mild cardiomegaly and BNP is within normal limits. His ultrasound is negative for a blood clot. I have independently reviewed his CXR, ultrasound and CMET/BNP and agree with the findings. He does not have other symptoms of heart failure though we may have to investigate further if symptoms persist or worsen. Suspect venous dysfunction. Advised on compression stockings and recommended [employer] brand. Will do trial of Lasix as below, which may transition to a PRN basis. Counseled on side effects of this medication. He has follow up in this clinic on 07/27/2019 which will be a good touch point for him.   - furosemide (LASIX) 20 MG tablet; Take 1 tablet (20 mg total) by mouth daily.  Dispense: 90 tablet; Refill: 0  The entirety of the information documented in the History of Present Illness, Review of Systems and Physical Exam were personally obtained by me. Portions of this information were initially documented by [facility] and reviewed by me for thoroughness and accuracy.       Trinna Post, PA-C  [facility]

## 2019-07-23 NOTE — Patient Instructions (Signed)
Compression Stockings first thing in the morning and last thing at night: [place] can be bought on [address].    Edema  Edema is when you have too much fluid in your body or under your skin. Edema may make your legs, feet, and ankles swell up. Swelling is also common in looser tissues, like around your eyes. This is a common condition. It gets more common as you get older. There are many possible causes of edema. Eating too much salt (sodium) and being on your feet or sitting for a long time can cause edema in your legs, feet, and ankles. Hot weather may make edema worse. Edema is usually painless. Your skin may look swollen or shiny. Follow these instructions at home:  Keep the swollen body part raised (elevated) above the level of your heart when you are sitting or lying down.  Do not sit still or stand for a long time.  Do not wear tight clothes. Do not wear garters on your upper legs.  Exercise your legs. This can help the swelling go down.  Wear elastic bandages or support stockings as told by your doctor.  Eat a low-salt (low-sodium) diet to reduce fluid as told by your doctor.  Depending on the cause of your swelling, you may need to limit how much fluid you drink (fluid restriction).  Take over-the-counter and prescription medicines only as told by your doctor. Contact a doctor if:  Treatment is not working.  You have heart, liver, or kidney disease and have symptoms of edema.  You have sudden and unexplained weight gain. Get help right away if:  You have shortness of breath or chest pain.  You cannot breathe when you lie down.  You have pain, redness, or warmth in the swollen areas.  You have heart, liver, or kidney disease and get edema all of a sudden.  You have a fever and your symptoms get worse all of a sudden. Summary  Edema is when you have too much fluid in your body or under your skin.  Edema may make your legs, feet, and ankles swell up. Swelling is also  common in looser tissues, like around your eyes.  Raise (elevate) the swollen body part above the level of your heart when you are sitting or lying down.  Follow your doctor's instructions about diet and how much fluid you can drink (fluid restriction). This information is not intended to replace advice given to you by your health care provider. Make sure you discuss any questions you have with your health care provider. Document Revised: 05/23/2017 Document Reviewed: 06/07/2016 Elsevier Patient Education  2020 [employer].

## 2019-07-27 ENCOUNTER — Ambulatory Visit (INDEPENDENT_AMBULATORY_CARE_PROVIDER_SITE_OTHER): Payer: Medicare Other | Admitting: Adult Health

## 2019-07-27 ENCOUNTER — Encounter: Payer: Self-pay | Admitting: Adult Health

## 2019-07-27 ENCOUNTER — Other Ambulatory Visit: Payer: Self-pay

## 2019-07-27 VITALS — BP 130/80 | HR 63 | Temp 97.1°F | Resp 16 | Wt 194.4 lb

## 2019-07-27 DIAGNOSIS — R600 Localized edema: Secondary | ICD-10-CM

## 2019-07-27 DIAGNOSIS — R609 Edema, unspecified: Secondary | ICD-10-CM | POA: Insufficient documentation

## 2019-07-27 DIAGNOSIS — M7121 Synovial cyst of popliteal space [Baker], right knee: Secondary | ICD-10-CM

## 2019-07-27 MED ORDER — FUROSEMIDE 20 MG PO TABS: 20.0000 mg | ORAL_TABLET | ORAL | 0 refills | Status: DC

## 2019-07-27 NOTE — Patient Instructions (Addendum)
Lasix 20 mg by mouth take one tablet every other day. Do not take everyday now.  We may be able to discontinue once symptoms resolve. Let us know if any symptoms are worsening or not improving. Follow up in one month and sooner if needed. Report any shortness of breath,incraesed swelling,  fatigue, chest pain immediately.   Advised patient call the office or your primary care doctor for an appointment if no improvement within 72 hours or if any symptoms change or worsen at any time  Advised ER or urgent Care if after hours or on weekend. Call 911 for emergency symptoms at any time.Patinet verbalized understanding of all instructions given/reviewed and treatment plan and has no further questions or concerns at this time.      Baker Cyst  A Baker cyst, also called a popliteal cyst, is a growth that forms at the back of the knee. The cyst forms when the fluid-filled sac (bursa) that cushions the knee joint becomes enlarged. What are the causes? In most cases, a Baker cyst results from another knee problem that causes swelling inside the knee. This makes the fluid inside the knee joint (synovial fluid) flow into the bursa behind the knee, causing the bursa to enlarge. What increases the risk? You may be more likely to develop a Baker cyst if you already have a knee problem, such as:  A tear in cartilage that cushions the knee joint (meniscal tear).  A tear in the tissues that connect the bones of the knee joint (ligament tear).  Knee swelling from osteoarthritis, rheumatoid arthritis, or gout. What are the signs or symptoms? The main symptom of this condition is a lump behind the knee. This may be the only symptom of the condition. The lump may be painful, especially when the knee is straightened. If the lump is painful, the pain may come and go. The knee may also be stiff. Symptoms may quickly get more severe if the cyst breaks open (ruptures). If the cyst ruptures, you may feel the following in  your knee and calf:  Sudden or worsening pain.  Swelling.  Bruising.  Redness in the calf. A Baker cyst does not always cause symptoms. How is this diagnosed? This condition may be diagnosed based on your symptoms and medical history. Your health care provider will also do a physical exam. This may include:  Feeling the cyst to check whether it is tender.  Checking your knee for signs of another knee condition that causes swelling. You may have imaging tests, such as:  X-rays.  MRI.  Ultrasound. How is this treated? A Baker cyst that is not painful may go away without treatment. If the cyst gets large or painful, it will likely get better if the underlying knee problem is treated. If needed, treatment for a Baker cyst may include:  Resting.  Keeping weight off of the knee. This means not leaning on the knee to support your body weight.  Taking NSAIDs, such as ibuprofen, to reduce pain and swelling.  Having a procedure to drain the fluid from the cyst with a needle (aspiration). You may also get an injection of a medicine that reduces swelling (steroid).  Having surgery. This may be needed if other treatments do not work. This usually involves correcting knee damage and removing the cyst. Follow these instructions at home:  Activity  Rest as told by your health care provider.  Avoid activities that make pain or swelling worse.  Return to your normal activities as  told by your health care provider. Ask your health care provider what activities are safe for you.  Do not use the injured limb to support your body weight until your health care provider says that you can. Use crutches as told by your health care provider. General instructions  Take over-the-counter and prescription medicines only as told by your health care provider.  Keep all follow-up visits as told by your health care provider. This is important. Contact a health care provider if:  You have knee  pain, stiffness, or swelling that does not get better. Get help right away if:  You have sudden or worsening pain and swelling in your calf area. Summary  A Baker cyst, also called a popliteal cyst, is a growth that forms at the back of the knee.  In most cases, a Baker cyst results from another knee problem that causes swelling inside the knee.  A Baker cyst that is not painful may go away without treatment.  If needed, treatment for a Baker cyst may include resting, keeping weight off of the knee, medicines, or draining fluid from the cyst.  Surgery may be needed if other treatments are not effective. This information is not intended to replace advice given to you by your health care provider. Make sure you discuss any questions you have with your health care provider. Document Revised: 10/02/2018 Document Reviewed: 10/02/2018 Elsevier Patient Education  [place]. Furosemide Oral Tablets What is this medicine? FUROSEMIDE (fyoor [place] se mide) is a diuretic. It helps you make more urine and to lose salt and excess water from your body. It treats swelling from heart, kidney, or liver disease. It also treats high blood pressure. This medicine may be used for other purposes; ask your health care provider or pharmacist if you have questions. COMMON BRAND NAME(S): Active-Medicated Specimen Kit, Delone, Diuscreen, Lasix, RX Specimen Collection Kit, Specimen Collection Kit, URINX Medicated Specimen Collection What should I tell my health care provider before I take this medicine? They need to know if you have any of these conditions:  abnormal blood electrolytes  diarrhea or vomiting  gout  heart disease  kidney disease, small amounts of urine, or difficulty passing urine  liver disease  thyroid disease  an unusual or allergic reaction to furosemide, sulfa drugs, other medicines, foods, dyes, or preservatives  pregnant or trying to get pregnant  breast-feeding How should  I use this medicine? Take this drug by mouth. Take it as directed on the prescription label at the same time every day. You can take it with or without food. If it upsets your stomach, take it with food. Keep taking it unless your health care provider tells you to stop. Talk to your health care provider about the use of this drug in children. Special care may be needed. Overdosage: If you think you have taken too much of this medicine contact a poison control center or emergency room at once. NOTE: This medicine is only for you. Do not share this medicine with others. What if I miss a dose? If you miss a dose, take it as soon as you can. If it is almost time for your next dose, take only that dose. Do not take double or extra doses. What may interact with this medicine?  aspirin and aspirin-like medicines  certain antibiotics  chloral hydrate  cisplatin  cyclosporine  digoxin  diuretics  laxatives  lithium  medicines for blood pressure  medicines that relax muscles for surgery  methotrexate  NSAIDs, medicines for pain and inflammation like ibuprofen, naproxen, or indomethacin  phenytoin  steroid medicines like prednisone or cortisone  sucralfate  thyroid hormones This list may not describe all possible interactions. Give your health care provider a list of all the medicines, herbs, non-prescription drugs, or dietary supplements you use. Also tell them if you smoke, drink alcohol, or use illegal drugs. Some items may interact with your medicine. What should I watch for while using this medicine? Visit your doctor or health care provider for regular checks on your progress. Check your blood pressure regularly. Ask your doctor or health care provider what your blood pressure should be, and when you should contact him or her. If you are a diabetic, check your blood sugar as directed. This medicine may cause serious skin reactions. They can happen weeks to months after  starting the medicine. Contact your health care provider right away if you notice fevers or flu-like symptoms with a rash. The rash may be red or purple and then turn into blisters or peeling of the skin. Or, you might notice a red rash with swelling of the face, lips or lymph nodes in your neck or under your arms. You may need to be on a special diet while taking this medicine. Check with your doctor. Also, ask how many glasses of fluid you need to drink a day. You must not get dehydrated. You may get drowsy or dizzy. Do not drive, use machinery, or do anything that needs mental alertness until you know how this drug affects you. Do not stand or sit up quickly, especially if you are an older patient. This reduces the risk of dizzy or fainting spells. Alcohol can make you more drowsy and dizzy. Avoid alcoholic drinks. This medicine can make you more sensitive to the sun. Keep out of the sun. If you cannot avoid being in the sun, wear protective clothing and use sunscreen. Do not use sun lamps or tanning beds/booths. What side effects may I notice from receiving this medicine? Side effects that you should report to your doctor or health care professional as soon as possible:  blood in urine or stools  dry mouth  fever or chills  hearing loss or ringing in the ears  irregular heartbeat  muscle pain or weakness, cramps  rash, fever, and swollen lymph nodes  redness, blistering, peeling or loosening of the skin, including inside the mouth  skin rash  stomach upset, pain, or nausea  tingling or numbness in the hands or feet  unusually weak or tired  vomiting or diarrhea  yellowing of the eyes or skin Side effects that usually do not require medical attention (report to your doctor or health care professional if they continue or are bothersome):  headache  loss of appetite  unusual bleeding or bruising This list may not describe all possible side effects. Call your doctor for  medical advice about side effects. You may report side effects to FDA at 1-800-FDA-1088. Where should I keep my medicine? Keep out of the reach of children and pets. Store at room temperature between 20 and 25 degrees C (68 and 77 degrees F). Protect from light and moisture. Keep the container tightly closed. Throw away any unused drug after the expiration date. NOTE: This sheet is a summary. It may not cover all possible information. If you have questions about this medicine, talk to your doctor, pharmacist, or health care provider.  2020 Elsevier/Gold Standard (2019-01-05 18:01:32)  Peripheral Edema  Peripheral edema  is swelling that is caused by a buildup of fluid. Peripheral edema most often affects the lower legs, ankles, and feet. It can also develop in the arms, hands, and face. The area of the body that has peripheral edema will look swollen. It may also feel heavy or warm. Your clothes may start to feel tight. Pressing on the area may make a temporary dent in your skin. You may not be able to move your swollen arm or leg as much as usual. There are many causes of peripheral edema. It can happen because of a complication of other conditions such as congestive heart failure, kidney disease, or a problem with your blood circulation. It also can be a side effect of certain medicines or because of an infection. It often happens to women during pregnancy. Sometimes, the cause is not known. Follow these instructions at home: Managing pain, stiffness, and swelling   Raise (elevate) your legs while you are sitting or lying down.  Move around often to prevent stiffness and to lessen swelling.  Do not sit or stand for long periods of time.  Wear support stockings as told by your health care provider. Medicines  Take over-the-counter and prescription medicines only as told by your health care provider.  Your health care provider may prescribe medicine to help your body get rid of excess water  (diuretic). General instructions  Pay attention to any changes in your symptoms.  Follow instructions from your health care provider about limiting salt (sodium) in your diet. Sometimes, eating less salt may reduce swelling.  Moisturize skin daily to help prevent skin from cracking and draining.  Keep all follow-up visits as told by your health care provider. This is important. Contact a health care provider if you have:  A fever.  Edema that starts suddenly or is getting worse, especially if you are pregnant or have a medical condition.  Swelling in only one leg.  Increased swelling, redness, or pain in one or both of your legs.  Drainage or sores at the area where you have edema. Get help right away if you:  Develop shortness of breath, especially when you are lying down.  Have pain in your chest or abdomen.  Feel weak.  Feel faint. Summary  Peripheral edema is swelling that is caused by a buildup of fluid. Peripheral edema most often affects the lower legs, ankles, and feet.  Move around often to prevent stiffness and to lessen swelling. Do not sit or stand for long periods of time.  Pay attention to any changes in your symptoms.  Contact a health care provider if you have edema that starts suddenly or is getting worse, especially if you are pregnant or have a medical condition.  Get help right away if you develop shortness of breath, especially when lying down. This information is not intended to replace advice given to you by your health care provider. Make sure you discuss any questions you have with your health care provider. Document Revised: 02/11/2018 Document Reviewed: 02/11/2018 Elsevier Patient Education  [place].

## 2019-07-27 NOTE — Progress Notes (Signed)
Patient: Zachary [name] Male    DOB: 1941/06/02   79 y.o.   MRN: [id number] Visit Date: 07/27/2019  Today's Provider: Marcille Buffy, FNP   Chief Complaint  Patient presents with  . Edema    1 week follow up   Subjective:     HPI  Follow up for right lower leg edema Onset: approximately 07/15/2019 with right lower leg swelling he noticed while putting on his pajamas.  The patient was last seen for this 1 weeks ago for pitting edema to right medial ankle that was pitting 2 + as well as mild edema to calf. No bruising or erythema was noted. Mild warmth was palpated in calf at that time. Negative Homans sign.Changes made at last visit include advising patient to decrease sodium in his diet and elevate leg as well as compression. Ultrasound was ordered 07/19/2019 to rule out DVT and ultrasound showed no evidence of DVT within the right lower extremity. Incidentally a Other Findings: Note is made of an approximately 4.5 x 1.0 x 5.0 cm  serpiginous fluid collection with the right popliteal fossa  compatible with a Baker's cyst.IMPRESSION:  1. No evidence of DVT within the right lower extremity. 2. Incidentally noted 5 cm right-sided Baker's cyst. He feels that condition is Improved.  Provider also obtained a chest x ray which shows only mild cardiomegaly, and also a BNP was mildly elevated  252.1 which was ok for his age range. He has no other associated symptoms of CHF.   He was seen again for right leg edema on 07/23/2019 by Marton Redwood PA-C and patient had been elevating leg and avoiding increased salt, he reported improvement and was found to have non pitting edema - mild of Right lower extremity. He was given trial of Lasix 20 mg daily to try at that visit and was to keep follow up today 07/27/19, and may go to PRN basis. He reported any new or other associated symptoms at that time. Compression Stockings Sock well brand were added as well. He does not have other  symptoms of heart failure though we may have to investigate further if symptoms persist or worsen  Today 07/27/19 He reports he took Lasix the past four days. He has not noticed a significant improvement, he has been wearing compression stocking to right lower extremity.  Swelling is improved since last provider visit with this Probation officer.  He still denies any shortness of breath, other edema, chest pain or any other respiratory/ cardiac symptoms. Denies fatigue, back pain, or abdominal pain, denies any pain in upper or lower leg.  He has taken both Covid vaccinations.   Patient  denies any fever, body aches,chills, rash, chest pain, shortness of breath, nausea, vomiting, or diarrhea.   ------------------------------------------------------------------------------------  No Known Allergies   Current Outpatient Medications:  .  aspirin EC 81 MG tablet, Take 81 mg by mouth at bedtime., Disp: , Rfl:  .  atorvastatin (LIPITOR) 20 MG tablet, TAKE 1 TABLET BY MOUTH AT BEDTIME, Disp: 90 tablet, Rfl: 3 .  Cyanocobalamin (VITAMIN B-12) 500 MCG SUBL, Place 1 tablet (500 mcg total) under the tongue daily., Disp: 90 tablet, Rfl: 0 .  furosemide (LASIX) 20 MG tablet, Take 1 tablet (20 mg total) by mouth daily., Disp: 90 tablet, Rfl: 0 .  lisinopril (PRINIVIL,ZESTRIL) 40 MG tablet, TAKE 1 TABLET BY MOUTH ONCE DAILY (Patient taking differently: Take 1/2 tablet daily.), Disp: 90 tablet, Rfl: 3 .  meclizine (ANTIVERT) 25  MG tablet, Take 1 tablet (25 mg total) by mouth 3 (three) times daily as needed for dizziness., Disp: 30 tablet, Rfl: 0  Review of Systems  Constitutional: Negative.   HENT: Negative.   Respiratory: Negative.  Negative for apnea, cough, choking, chest tightness, shortness of breath, wheezing and stridor.   Cardiovascular: Positive for leg swelling (right lower leg only. ). Negative for chest pain and palpitations.  Genitourinary: Negative.   Musculoskeletal: Negative.   Skin: Negative.     Neurological: Negative.   Hematological: Negative.   Psychiatric/Behavioral: Negative.     Social History   Tobacco Use  . Smoking status: Never Smoker  . Smokeless tobacco: Never Used  Substance Use Topics  . Alcohol use: No    Alcohol/week: 0.0 standard drinks      Objective:   Vitals with BMI 07/27/2019 07/23/2019 07/19/2019  Height - - -  Weight 194 lbs 6 oz 193 lbs 10 oz 195 lbs  BMI - - -  Systolic [id number]  Diastolic 80 76 82  Pulse 63 77 69   Body mass index is 24.63 kg/m. Blood pressure 130/80, pulse 63, temperature (!) 97.1 F (36.2 C), temperature source Temporal, resp. rate 16, weight 194 lb 6.4 oz (88.2 kg), SpO2 98 %.    Physical Exam Vitals reviewed.  Neck:     Vascular: No carotid bruit.  Cardiovascular:     Rate and Rhythm: Normal rate and regular rhythm.     Pulses: Normal pulses.     Heart sounds: Normal heart sounds. No murmur. No friction rub. No gallop.   Pulmonary:     Effort: Pulmonary effort is normal. No respiratory distress.     Breath sounds: Normal breath sounds. No stridor. No wheezing, rhonchi or rales.     Comments: No dyspnea. No adventitious lung sounds - clear sounds in all fields, normal air movement.  Chest:     Chest wall: No tenderness.  Abdominal:     General: Bowel sounds are normal. There is no distension.     Palpations: Abdomen is soft. There is no shifting dullness, fluid wave, hepatomegaly, splenomegaly or pulsatile mass.     Tenderness: There is no abdominal tenderness.  Musculoskeletal:        General: No swelling, tenderness, deformity or signs of injury.     Cervical back: Normal range of motion and neck supple. No rigidity or tenderness.     Right knee: Swelling (palpable cyst at right popliteal fossa,  not painful, no erythema  approximately 4cm x 4 cm in size. soft and mobile. ) present. No bony tenderness. Normal range of motion. No tenderness.     Left knee: Normal.     Right lower leg: Swelling present.  No deformity, lacerations, tenderness or bony tenderness. 1+ Edema present.     Left lower leg: No deformity, lacerations, tenderness or bony tenderness.       Legs:     Comments: 1+ non pitting edema to calf of right leg, without warmth. Ankle is not swollen today, he removed compression stocking prior to exam.  Right 1+ posterior tibial and dorsalis pedis pulse.   Lymphadenopathy:     Cervical: No cervical adenopathy.  Skin:    General: Skin is warm.     Capillary Refill: Capillary refill takes less than 2 seconds.     Findings: No erythema, lesion or rash.  Neurological:     General: No focal deficit present.     Mental Status: He  is oriented to person, place, and time.  Psychiatric:        Attention and Perception: Attention normal.        Mood and Affect: Mood normal.        Speech: Speech normal.        Behavior: Behavior normal. Behavior is cooperative.        Thought Content: Thought content normal.        Cognition and Memory: Cognition and memory normal.        Judgment: Judgment normal.    Patient moves on and off of exam table and in room without difficulty. Gait is normal in hall and in room. Patient is oriented to person place time and situation. Patient answers questions appropriately and engages in conversation.   No results found for any visits on 07/27/19.     Assessment & Plan    Will decrease Lasix to every other day now. He may be able to discontinue once swelling resolves. Advised Apply a compressive ACE bandage. Rest and elevate the affected area.  Apply cold compresses intermittently as needed.   Potassium normal 4.2 last lab 07/19/2019. Advised of potassium rich foods. Signs of hypotension and side effects possible with Lasix suspect swelling from cyst in right leg popliteal fossa, no pain. DVT ultrasound was normal besides cyst. He also has no cardiac or respiratory symptoms. Other differentials could include venous congestion/ dysfunction, peripheral  vascular disease, mild CHF however no further work up at this time unless symptoms do not resolve or worsen at any time. He is advised of when to reach out to the office for sooner follow up. Patient verbalized understanding of all instructions given and denies any further questions at this time.    Meds ordered this encounter  Medications  . furosemide (LASIX) 20 MG tablet    Sig: Take 1 tablet (20 mg total) by mouth every other day.    Dispense:  90 tablet    Refill:  0   Return in about 1 month (around 08/24/2019), or if symptoms worsen or fail to improve, for at any time for any worsening symptoms, Go to Emergency room/ urgent care if worse.  Provider thoroughly discussed in collaboration above plan with supervising physician Dr. Miguel Aschoff who is in agreement with the care plan as above and came in to examine patient and review plan.    Addressed extensive list of chronic and acute medical problems today requiring 32 minutes reviewing his medical record, counseling patient regarding his conditions and coordination of care.      The entirety of the information documented in the History of Present Illness, Review of Systems and Physical Exam were personally obtained by me. Portions of this information were initially documented by the  Certified Medical Assistant whose name is documented in [place] and reviewed by me for thoroughness and accuracy.  I have personally performed the exam and reviewed the chart and it is accurate to the best of my knowledge.  Haematologist has been used and any errors in dictation or transcription are unintentional.  Kelby Aline. [place], [facility]

## 2019-08-03 ENCOUNTER — Encounter: Payer: Self-pay | Admitting: Adult Health

## 2019-08-03 DIAGNOSIS — R718 Other abnormality of red blood cells: Secondary | ICD-10-CM | POA: Insufficient documentation

## 2019-08-03 DIAGNOSIS — D519 Vitamin B12 deficiency anemia, unspecified: Secondary | ICD-10-CM | POA: Insufficient documentation

## 2019-08-03 HISTORY — DX: Vitamin B12 deficiency anemia, unspecified: D51.9

## 2019-08-07 ENCOUNTER — Other Ambulatory Visit: Payer: Self-pay | Admitting: Family Medicine

## 2019-08-07 NOTE — Telephone Encounter (Signed)
Refilled per protocol.

## 2019-08-23 NOTE — Progress Notes (Signed)
Patient: Zachary [name] Male    DOB: 03/20/1941   79 y.o.   MRN: [id number] Visit Date: 08/23/2019  Today's Provider: [name], MD   No chief complaint on file.  Subjective:     HPI  He did not notice the edema until the last couple of months and then was found to have a Baker's cyst.  He continues to have mild right greater than left lower extremity edema but  it is improving. Right Leg Edema From 07/27/2019-seen by Laverna Peace. Decreased Lasix to every other day now. Advised to follow up with pcp.   No Known Allergies   Current Outpatient Medications:  .  aspirin EC 81 MG tablet, Take 81 mg by mouth at bedtime., Disp: , Rfl:  .  atorvastatin (LIPITOR) 20 MG tablet, TAKE 1 TABLET BY MOUTH AT BEDTIME, Disp: 90 tablet, Rfl: 0 .  Cyanocobalamin (VITAMIN B-12) 500 MCG SUBL, Place 1 tablet (500 mcg total) under the tongue daily., Disp: 90 tablet, Rfl: 0 .  furosemide (LASIX) 20 MG tablet, Take 1 tablet (20 mg total) by mouth every other day., Disp: 90 tablet, Rfl: 0 .  lisinopril (PRINIVIL,ZESTRIL) 40 MG tablet, TAKE 1 TABLET BY MOUTH ONCE DAILY (Patient taking differently: Take 1/2 tablet daily.), Disp: 90 tablet, Rfl: 3 .  meclizine (ANTIVERT) 25 MG tablet, Take 1 tablet (25 mg total) by mouth 3 (three) times daily as needed for dizziness., Disp: 30 tablet, Rfl: 0  Review of Systems  Constitutional: Negative for appetite change, chills and fever.  HENT: Negative.   Eyes: Negative.   Respiratory: Negative for chest tightness, shortness of breath and wheezing.   Cardiovascular: Negative for chest pain and palpitations.  Gastrointestinal: Negative for abdominal pain, nausea and vomiting.  Endocrine: Negative.   Musculoskeletal: Negative.   Allergic/Immunologic: Negative.   Neurological: Negative.   Hematological: Negative.   Psychiatric/Behavioral: Negative.     Social History   Tobacco Use  . Smoking status: Never Smoker  . Smokeless tobacco:  Never Used  Substance Use Topics  . Alcohol use: No    Alcohol/week: 0.0 standard drinks      Objective:   There were no vitals taken for this visit. There were no vitals filed for this visit.There is no height or weight on file to calculate BMI.   Physical Exam Vitals reviewed.  Constitutional:      Appearance: He is well-developed.  HENT:     Head: Normocephalic and atraumatic.  Eyes:     General: No scleral icterus.    Conjunctiva/sclera: Conjunctivae normal.  Neck:     Thyroid: No thyromegaly.  Cardiovascular:     Rate and Rhythm: Normal rate and regular rhythm.     Heart sounds: Normal heart sounds.  Pulmonary:     Effort: Pulmonary effort is normal.     Breath sounds: Normal breath sounds.  Abdominal:     Palpations: Abdomen is soft.  Musculoskeletal:     Right lower leg: Edema present.     Comments: Trace left lower extremity edema and 1+ right lower extremity edema above the ankle.  Lymphadenopathy:     Cervical: No cervical adenopathy.  Skin:    General: Skin is warm and dry.  Neurological:     General: No focal deficit present.     Mental Status: He is alert and oriented to person, place, and time.     Comments: I noted right > left hand tremor at rest.  Psychiatric:        Mood and Affect: Mood normal.        Behavior: Behavior normal.        Thought Content: Thought content normal.        Judgment: Judgment normal.      No results found for any visits on 08/24/19.     Assessment & Plan    1. Peripheral edema- right lower leg  This appears to be from a Baker's cyst mainly and is improving.  Continue every other day Lasix for the time being see him back in 2 months.  Renal panel then.  2. Essential hypertension Good control on lisinopril  3. Benign essential tremor Follow clinically.  4. Hypercholesterolemia without hypertriglyceridemia On atorvastatin.     Areta Terwilliger Cranford Mon, MD  [facility]

## 2019-08-24 ENCOUNTER — Ambulatory Visit (INDEPENDENT_AMBULATORY_CARE_PROVIDER_SITE_OTHER): Payer: Medicare Other | Admitting: Family Medicine

## 2019-08-24 ENCOUNTER — Other Ambulatory Visit: Payer: Self-pay

## 2019-08-24 ENCOUNTER — Encounter: Payer: Self-pay | Admitting: Family Medicine

## 2019-08-24 VITALS — BP 145/72 | HR 65 | Temp 97.1°F | Resp 17 | Ht 74.0 in | Wt 193.6 lb

## 2019-08-24 DIAGNOSIS — E7800 Pure hypercholesterolemia, unspecified: Secondary | ICD-10-CM | POA: Diagnosis not present

## 2019-08-24 DIAGNOSIS — G250 Essential tremor: Secondary | ICD-10-CM | POA: Diagnosis not present

## 2019-08-24 DIAGNOSIS — R609 Edema, unspecified: Secondary | ICD-10-CM

## 2019-08-24 DIAGNOSIS — I10 Essential (primary) hypertension: Secondary | ICD-10-CM

## 2019-10-18 NOTE — Progress Notes (Deleted)
     Established patient visit   Patient: Zachary [name]   DOB: 02-27-1941   79 y.o. Male  MRN: [id number] Visit Date: 10/25/2019  Today's healthcare provider: [name], MD   No chief complaint on file.  Subjective    HPI   Peripheral edema- right lower leg  From 08/24/2019-This appears to be from a Baker's cyst mainly and is improving. Continue every other day Lasix for the time being see him back in 2 months. Renal panel then.   {Show patient history (optional):23778::" "}   Medications: Outpatient Medications Prior to Visit  Medication Sig  . aspirin EC 81 MG tablet Take 81 mg by mouth at bedtime.  Marland Kitchen atorvastatin (LIPITOR) 20 MG tablet TAKE 1 TABLET BY MOUTH AT BEDTIME  . Cyanocobalamin (VITAMIN B-12) 500 MCG SUBL Place 1 tablet (500 mcg total) under the tongue daily.  . furosemide (LASIX) 20 MG tablet Take 1 tablet (20 mg total) by mouth every other day.  . lisinopril (PRINIVIL,ZESTRIL) 40 MG tablet TAKE 1 TABLET BY MOUTH ONCE DAILY (Patient taking differently: Take 1/2 tablet daily.)  . meclizine (ANTIVERT) 25 MG tablet Take 1 tablet (25 mg total) by mouth 3 (three) times daily as needed for dizziness.   No facility-administered medications prior to visit.    Review of Systems  Constitutional: Negative for appetite change, chills and fever.  Respiratory: Negative for chest tightness, shortness of breath and wheezing.   Cardiovascular: Negative for chest pain and palpitations.  Gastrointestinal: Negative for abdominal pain, nausea and vomiting.    {CBC  CMET  Lipids  Results Review (optional):23779::" "}  Objective    There were no vitals taken for this visit. {Show previous vital signs (optional):23777::" "}  Physical Exam  ***  No results found for any visits on 10/25/19.  Assessment & Plan     ***  No follow-ups on file.      {provider attestation***:1}   [name], MD  [facility] [phone]  (phone) [phone] (fax)  [place]

## 2019-10-25 ENCOUNTER — Ambulatory Visit: Payer: PRIVATE HEALTH INSURANCE | Admitting: Family Medicine

## 2019-10-27 ENCOUNTER — Other Ambulatory Visit: Payer: Self-pay

## 2019-10-27 DIAGNOSIS — E782 Mixed hyperlipidemia: Secondary | ICD-10-CM

## 2019-10-27 MED ORDER — ATORVASTATIN CALCIUM 20 MG PO TABS: 20.0000 mg | ORAL_TABLET | Freq: Every day | ORAL | 1 refills | Status: DC

## 2019-11-02 ENCOUNTER — Other Ambulatory Visit: Payer: Self-pay | Admitting: Family Medicine

## 2019-11-02 DIAGNOSIS — E782 Mixed hyperlipidemia: Secondary | ICD-10-CM

## 2019-11-03 ENCOUNTER — Other Ambulatory Visit: Payer: Self-pay

## 2019-11-03 ENCOUNTER — Ambulatory Visit (INDEPENDENT_AMBULATORY_CARE_PROVIDER_SITE_OTHER): Payer: Medicare Other | Admitting: Family Medicine

## 2019-11-03 VITALS — BP 126/82 | HR 66 | Temp 96.8°F | Wt 185.0 lb

## 2019-11-03 DIAGNOSIS — I10 Essential (primary) hypertension: Secondary | ICD-10-CM

## 2019-11-03 DIAGNOSIS — K635 Polyp of colon: Secondary | ICD-10-CM | POA: Diagnosis not present

## 2019-11-03 DIAGNOSIS — R42 Dizziness and giddiness: Secondary | ICD-10-CM

## 2019-11-03 DIAGNOSIS — E782 Mixed hyperlipidemia: Secondary | ICD-10-CM | POA: Diagnosis not present

## 2019-11-03 DIAGNOSIS — R609 Edema, unspecified: Secondary | ICD-10-CM

## 2019-11-03 MED ORDER — MECLIZINE HCL 25 MG PO TABS: 25.0000 mg | ORAL_TABLET | Freq: Three times a day (TID) | ORAL | 0 refills | Status: DC | PRN

## 2019-11-03 MED ORDER — ATORVASTATIN CALCIUM 20 MG PO TABS: 20.0000 mg | ORAL_TABLET | Freq: Every day | ORAL | 1 refills | Status: DC

## 2019-11-03 MED ORDER — LISINOPRIL 20 MG PO TABS: 20.0000 mg | ORAL_TABLET | Freq: Every day | ORAL | 3 refills | Status: DC

## 2019-11-03 NOTE — Progress Notes (Signed)
Established patient visit  I,Kaysin Brock D Shahin Knierim,acting as a scribe for [name], MD.,have documented all relevant documentation on the behalf of [name], MD,as directed by  [name], MD while in the presence of [name], MD.   Patient: Zachary [name]   DOB: Oct 30, 1940   79 y.o. Male  MRN: [id number] Visit Date: 11/03/2019  Today's healthcare provider: [name], MD   Chief Complaint  Patient presents with  . Edema   Subjective    HPI Patient is feeling well and has no complaints. Edema -Patient was last seen on 08/24/19.  At that time it was thought to be coming mainly from a Baker's cyst.  It was noted to be improving.  Patient was instructed to continue with Lasix every other day and have repeat renal panel on today's visit. He had also been advised to wear support hose.  Patient states he is doing well.  He is not wearing his support hose today but has otherwise been wearing them all the time.  He does have some swelling today.       Medications: Outpatient Medications Prior to Visit  Medication Sig  . aspirin EC 81 MG tablet Take 81 mg by mouth at bedtime.  . Cyanocobalamin (VITAMIN B-12) 500 MCG SUBL Place 1 tablet (500 mcg total) under the tongue daily.  . furosemide (LASIX) 20 MG tablet Take 1 tablet (20 mg total) by mouth every other day.  . [DISCONTINUED] atorvastatin (LIPITOR) 20 MG tablet Take 1 tablet (20 mg total) by mouth at bedtime.  . [DISCONTINUED] lisinopril (PRINIVIL,ZESTRIL) 40 MG tablet TAKE 1 TABLET BY MOUTH ONCE DAILY (Patient taking differently: Take 1/2 tablet daily.)  . [DISCONTINUED] meclizine (ANTIVERT) 25 MG tablet Take 1 tablet (25 mg total) by mouth 3 (three) times daily as needed for dizziness.   No facility-administered medications prior to visit.    Review of Systems  Respiratory: Negative for cough and shortness of breath.   Cardiovascular: Positive for leg swelling. Negative for chest  pain and palpitations.  Neurological: Negative for dizziness and headaches.       Objective    BP 126/82 (BP Location: Right Arm, Patient Position: Sitting, Cuff Size: Normal)   Pulse 66   Temp (!) 96.8 F (36 C) (Skin)   Wt 185 lb (83.9 kg)   SpO2 98%   BMI 23.75 kg/m  BP Readings from Last 3 Encounters:  11/03/19 126/82  08/24/19 (!) 145/72  07/27/19 130/80   Wt Readings from Last 3 Encounters:  11/03/19 185 lb (83.9 kg)  08/24/19 193 lb 9.6 oz (87.8 kg)  07/27/19 194 lb 6.4 oz (88.2 kg)      Physical Exam Constitutional:      Appearance: Normal appearance. He is normal weight.  HENT:     Head: Normocephalic.  Skin:    General: Skin is warm and dry.  Neurological:     General: No focal deficit present.     Mental Status: He is alert and oriented to person, place, and time. Mental status is at baseline.  Psychiatric:        Mood and Affect: Mood normal.        Behavior: Behavior normal.        Thought Content: Thought content normal.        Judgment: Judgment normal.       No results found for any visits on 11/03/19.  Assessment & Plan  Problem List Items Addressed This Visit      Cardiovascular and Mediastinum   Essential hypertension   Relevant Medications   lisinopril (ZESTRIL) 20 MG tablet   atorvastatin (LIPITOR) 20 MG tablet   Other Relevant Orders   Comprehensive metabolic panel   TSH     Other   Mixed hyperlipidemia   Relevant Medications   lisinopril (ZESTRIL) 20 MG tablet   atorvastatin (LIPITOR) 20 MG tablet   Other Relevant Orders   Comprehensive metabolic panel   Lipid Panel With LDL/HDL Ratio   Peripheral edema- right lower leg  - Primary    Patient has continued with the Lasix and wearing support hose. Recommend he continues to wear the hose, but on days he wants he can go without.   Obtain renal panel today.  Will discontinue the Lasix for now.        Vertigo   Relevant Medications   meclizine (ANTIVERT) 25 MG tablet      Other Visit Diagnoses    Sessile colonic polyp       Relevant Orders   Ambulatory referral to Gastroenterology       Return in about 3 months (around 02/03/2020) for CPE.      I, [name], MD, have reviewed all documentation for this visit. The documentation on 11/07/19 for the exam, diagnosis, procedures, and orders are all accurate and complete.    Richard Cranford Mon, MD  [facility] [phone] (phone) [phone] (fax)  [place]

## 2019-11-03 NOTE — Assessment & Plan Note (Addendum)
Patient has continued with the Lasix and wearing support hose. Recommend he continues to wear the hose, but on days he wants he can go without.   Obtain renal panel today.  Will discontinue the Lasix for now.

## 2019-11-04 DIAGNOSIS — I10 Essential (primary) hypertension: Secondary | ICD-10-CM | POA: Diagnosis not present

## 2019-11-04 DIAGNOSIS — E782 Mixed hyperlipidemia: Secondary | ICD-10-CM | POA: Diagnosis not present

## 2019-11-05 ENCOUNTER — Telehealth: Payer: Self-pay

## 2019-11-05 LAB — COMPREHENSIVE METABOLIC PANEL
ALT: 10 IU/L (ref 0–44)
AST: 19 IU/L (ref 0–40)
Albumin/Globulin Ratio: 2 (ref 1.2–2.2)
Albumin: 4 g/dL (ref 3.7–4.7)
Alkaline Phosphatase: 76 IU/L (ref 48–121)
BUN/Creatinine Ratio: 11 (ref 10–24)
BUN: 14 mg/dL (ref 8–27)
Bilirubin Total: 0.6 mg/dL (ref 0.0–1.2)
CO2: 21 mmol/L (ref 20–29)
Calcium: 9.2 mg/dL (ref 8.6–10.2)
Chloride: 107 mmol/L — ABNORMAL HIGH (ref 96–106)
Creatinine, Ser: 1.28 mg/dL — ABNORMAL HIGH (ref 0.76–1.27)
GFR calc Af Amer: 62 mL/min/{1.73_m2} (ref >59)
GFR calc non Af Amer: 54 mL/min/{1.73_m2} — ABNORMAL LOW (ref >59)
Globulin, Total: 2 g/dL (ref 1.5–4.5)
Glucose: 83 mg/dL (ref 65–99)
Potassium: 4.4 mmol/L (ref 3.5–5.2)
Sodium: 140 mmol/L (ref 134–144)
Total Protein: 6 g/dL (ref 6.0–8.5)

## 2019-11-05 LAB — LIPID PANEL WITH LDL/HDL RATIO
Cholesterol, Total: 180 mg/dL (ref 100–199)
HDL: 76 mg/dL (ref >39)
LDL Chol Calc (NIH): 92 mg/dL (ref 0–99)
LDL/HDL Ratio: 1.2 ratio (ref 0.0–3.6)
Triglycerides: 63 mg/dL (ref 0–149)
VLDL Cholesterol Cal: 12 mg/dL (ref 5–40)

## 2019-11-05 LAB — TSH: TSH: 4.24 u[IU]/mL (ref 0.450–4.500)

## 2019-11-05 NOTE — Telephone Encounter (Signed)
-----   Message from Jerrol Banana., MD sent at 11/05/2019 10:50 AM EDT ----- Labs stable.  Stay hydrated.

## 2019-11-05 NOTE — Telephone Encounter (Signed)
Patient advised.

## 2019-11-09 ENCOUNTER — Other Ambulatory Visit: Payer: Self-pay

## 2019-11-09 ENCOUNTER — Telehealth (INDEPENDENT_AMBULATORY_CARE_PROVIDER_SITE_OTHER): Payer: Self-pay | Admitting: Gastroenterology

## 2019-11-09 DIAGNOSIS — Z86010 Personal history of colonic polyps: Secondary | ICD-10-CM

## 2019-11-09 MED ORDER — NA SULFATE-K SULFATE-MG SULF 17.5-3.13-1.6 GM/177ML PO SOLN: 1.0000 | Freq: Once | ORAL | 0 refills | Status: AC

## 2019-11-09 NOTE — Progress Notes (Signed)
Gastroenterology Pre-Procedure Review  Request Date: Monday 12/13/19 Requesting Physician: Dr. Allen Norris  PATIENT REVIEW QUESTIONS: The patient responded to the following health history questions as indicated:    1. Are you having any GI issues? no 2. Do you have a personal history of Polyps? yes (2020 colonoscopy noted polyps-performed by Dr. Allen Norris) 3. Do you have a family history of Colon Cancer or Polyps? no 4. Diabetes Mellitus? no 5. Joint replacements in the past 12 months?no 6. Major health problems in the past 3 months?no 7. Any artificial heart valves, MVP, or defibrillator?no    MEDICATIONS & ALLERGIES:    Patient reports the following regarding taking any anticoagulation/antiplatelet therapy:   Plavix, Coumadin, Eliquis, Xarelto, Lovenox, Pradaxa, Brilinta, or Effient? no Aspirin? yes (81 mg daily)  Patient confirms/reports the following medications:  Current Outpatient Medications  Medication Sig Dispense Refill  . aspirin EC 81 MG tablet Take 81 mg by mouth at bedtime.    Marland Kitchen atorvastatin (LIPITOR) 20 MG tablet Take 1 tablet (20 mg total) by mouth at bedtime. 90 tablet 1  . Cyanocobalamin (VITAMIN B-12) 500 MCG SUBL Place 1 tablet (500 mcg total) under the tongue daily. 90 tablet 0  . lisinopril (ZESTRIL) 20 MG tablet Take 1 tablet (20 mg total) by mouth daily. Take 1/2 tablet daily. 90 tablet 3  . meclizine (ANTIVERT) 25 MG tablet Take 1 tablet (25 mg total) by mouth 3 (three) times daily as needed for dizziness. 30 tablet 0  . furosemide (LASIX) 20 MG tablet Take 1 tablet (20 mg total) by mouth every other day. (Patient not taking: Reported on 11/09/2019) 90 tablet 0   No current facility-administered medications for this visit.    Patient confirms/reports the following allergies:  No Known Allergies  No orders of the defined types were placed in this encounter.   AUTHORIZATION INFORMATION Primary Insurance: 1D#: Group #:  Secondary Insurance: 1D#: Group  #:  SCHEDULE INFORMATION: Date: Monday 12/13/19 Time: Location:[facility]

## 2019-11-16 ENCOUNTER — Other Ambulatory Visit: Payer: Self-pay

## 2019-11-16 DIAGNOSIS — Z86010 Personal history of colonic polyps: Secondary | ICD-10-CM

## 2019-11-23 NOTE — Progress Notes (Signed)
Subjective:   Zachary [name] is a 79 y.o. male who presents for Medicare Annual/Subsequent preventive examination.  I connected with Zachary [name] today by telephone and verified that I am speaking with the correct person using two identifiers. Location patient: home Location provider: work Persons participating in the virtual visit: patient, provider.   I discussed the limitations, risks, security and privacy concerns of performing an evaluation and management service by telephone and the availability of in person appointments. I also discussed with the patient that there may be a patient responsible charge related to this service. The patient expressed understanding and verbally consented to this telephonic visit.    Interactive audio and video telecommunications were attempted between this provider and patient, however failed, due to patient having technical difficulties OR patient did not have access to video capability.  We continued and completed visit with audio only.    Review of Systems    N/A  Cardiac Risk Factors include: advanced age (>73men, >38 women);dyslipidemia;hypertension;male gender     Objective:    There were no vitals filed for this visit. There is no height or weight on file to calculate BMI.  Advanced Directives 11/24/2019 06/21/2019 12/24/2018 11/23/2018 08/02/2018 11/19/2017 01/22/2017  Does Patient Have a Medical Advance Directive? No No No No No Yes Yes  Type of Advance Directive - - - - - Press photographer;Living will Living will;Healthcare Power of Attorney  Does patient want to make changes to medical advance directive? No - Patient declined - - - - - -  Copy of Press photographer in Chart? - - - - - No - copy requested -  Would patient like information on creating a medical advance directive? - - No - Patient declined No - Patient declined - - -    Current Medications (verified) Outpatient Encounter Medications as of  11/24/2019  Medication Sig  . aspirin EC 81 MG tablet Take 81 mg by mouth at bedtime.  Marland Kitchen atorvastatin (LIPITOR) 20 MG tablet Take 1 tablet (20 mg total) by mouth at bedtime.  . Cyanocobalamin (VITAMIN B-12) 500 MCG SUBL Place 1 tablet (500 mcg total) under the tongue daily.  Marland Kitchen lisinopril (ZESTRIL) 20 MG tablet Take 1 tablet (20 mg total) by mouth daily. Take 1/2 tablet daily.  . meclizine (ANTIVERT) 25 MG tablet Take 1 tablet (25 mg total) by mouth 3 (three) times daily as needed for dizziness.  . furosemide (LASIX) 20 MG tablet Take 1 tablet (20 mg total) by mouth every other day. (Patient not taking: Reported on 11/09/2019)   No facility-administered encounter medications on file as of 11/24/2019.    Allergies (verified) Patient has no known allergies.   History: Past Medical History:  Diagnosis Date  . B12 deficiency anemia 08/03/2019  . Hypercholesterolemia   . Hypertension   . Vertigo    last episode approx 09/2018   Past Surgical History:  Procedure Laterality Date  . APPENDECTOMY    . CATARACT EXTRACTION Bilateral   . COLONOSCOPY WITH PROPOFOL N/A 12/24/2018   Procedure: COLONOSCOPY WITH PROPOFOL;  Surgeon: Lucilla Lame, MD;  Location: [facility] ENDOSCOPY;  Service: Endoscopy;  Laterality: N/A;  . PLEURAL SCARIFICATION  05/2015   Family History  Problem Relation Age of Onset  . Hypertension Mother   . Hyperlipidemia Mother   . Heart attack Father   . Hypertension Father   . CVA Father   . ALS Brother   . Prostate cancer Brother    Social History  Socioeconomic History  . Marital status: Married    Spouse name: Not on file  . Number of children: 0  . Years of education: Not on file  . Highest education level: 12th grade  Occupational History  . Occupation: retired  Tobacco Use  . Smoking status: Never Smoker  . Smokeless tobacco: Never Used  Vaping Use  . Vaping Use: Never used  Substance and Sexual Activity  . Alcohol use: No    Alcohol/week: 0.0 standard drinks    . Drug use: No  . Sexual activity: Not on file  Other Topics Concern  . Not on file  Social History Narrative  . Not on file   Social Determinants of Health   Financial Resource Strain: Low Risk   . Difficulty of Paying Living Expenses: Not hard at all  Food Insecurity: No Food Insecurity  . Worried About Charity fundraiser in the Last Year: Never true  . Ran Out of Food in the Last Year: Never true  Transportation Needs: No Transportation Needs  . Lack of Transportation (Medical): No  . Lack of Transportation (Non-Medical): No  Physical Activity: Inactive  . Days of Exercise per Week: 0 days  . Minutes of Exercise per Session: 0 min  Stress: No Stress Concern Present  . Feeling of Stress : Not at all  Social Connections: Moderately Isolated  . Frequency of Communication with Friends and Family: Never  . Frequency of Social Gatherings with Friends and Family: Never  . Attends Religious Services: Never  . Active Member of Clubs or Organizations: Yes  . Attends Archivist Meetings: Never  . Marital Status: Married    Tobacco Counseling Counseling given: Not Answered   Clinical Intake:  Pre-visit preparation completed: Yes  Pain : No/denies pain     Nutritional Risks: None Diabetes: No  How often do you need to have someone help you when you read instructions, pamphlets, or other written materials from your doctor or pharmacy?: 1 - Never  Diabetic? No  Interpreter Needed?: No  Information entered by :: [facility], LPN   Activities of Daily Living In your present state of health, do you have any difficulty performing the following activities: 11/24/2019  Hearing? N  Vision? N  Comment Wears eye glasses.  Difficulty concentrating or making decisions? N  Walking or climbing stairs? N  Dressing or bathing? N  Doing errands, shopping? N  Preparing Food and eating ? N  Using the Toilet? N  In the past six months, have you accidently leaked urine? N   Do you have problems with loss of bowel control? N  Managing your Medications? N  Managing your Finances? N  Housekeeping or managing your Housekeeping? N  Some recent data might be hidden    Patient Care Team: Jerrol Banana., MD as PCP - General (Family Medicine) Dingeldein, Remo Lipps, MD as Consulting Physician (Ophthalmology)  Indicate any recent Medical Services you may have received from other than [facility] providers in the past year (date may be approximate).     Assessment:   This is a routine wellness examination for [place].  Hearing/Vision screen No exam data present  Dietary issues and exercise activities discussed: Current Exercise Habits: Home exercise routine, Type of exercise: walking, Time (Minutes): 30 (to 60 minutes), Frequency (Times/Week): 4, Weekly Exercise (Minutes/Week): 120, Intensity: Mild, Exercise limited by: None identified  Goals   None    Depression Screen [facility] 2/9 Scores 11/24/2019 11/23/2018 04/27/2018 11/19/2017 11/12/2016 11/12/2016 07/26/2015  PHQ - 2 Score 0 0 0 0 0 0 0  PHQ- 9 Score - - - - 0 0 -    Fall Risk Fall Risk  11/24/2019 11/23/2018 04/27/2018 11/19/2017 12/30/2016  Falls in the past year? 0 0 0 No Yes  Comment - - - - Emmi Telephone Survey: data to providers prior to load  Number falls in past yr: 0 - - - 1  Comment - - - - Emmi Telephone Survey Actual Response = 1  Injury with Fall? 0 - - - No    Any stairs in or around the home? No  If so, are there any without handrails? N/A Home free of loose throw rugs in walkways, pet beds, electrical cords, etc? Yes  Adequate lighting in your home to reduce risk of falls? Yes   ASSISTIVE DEVICES UTILIZED TO PREVENT FALLS:  Life alert? No  Use of a cane, walker or w/c? No  Grab bars in the bathroom? Yes  Shower chair or bench in shower? Yes  Elevated toilet seat or a handicapped toilet? Yes    Cognitive Function: Declined today.     6CIT Screen 11/12/2016  What Year? 0 points   What month? 0 points  What time? 0 points  Count back from 20 0 points  Months in reverse 0 points  Repeat phrase 0 points  Total Score 0    Immunizations Immunization History  Administered Date(s) Administered  . Fluad Quad(high Dose 65+) 03/08/2019  . Influenza, High Dose Seasonal PF 03/01/2015, 05/06/2016, 04/05/2017, 04/27/2018  . Pneumococcal Conjugate-13 06/09/2014  . Pneumococcal Polysaccharide-23 04/07/1996, 07/26/2015  . Td 02/10/2002  . Tdap 06/09/2014  . Zoster 06/30/2013    TDAP status: Up to date Flu Vaccine status: Up to date Pneumococcal vaccine status: Up to date Covid-19 vaccine status: Completed vaccines  Qualifies for Shingles Vaccine? Yes   Zostavax completed Yes   Shingrix Completed?: No.    Education has been provided regarding the importance of this vaccine. Patient has been advised to call insurance company to determine out of pocket expense if they have not yet received this vaccine. Advised may also receive vaccine at local pharmacy or Health Dept. Verbalized acceptance and understanding.  Screening Tests Health Maintenance  Topic Date Due  . COVID-19 Vaccine (1) Never done  . COLONOSCOPY  12/24/2019  . INFLUENZA VACCINE  01/02/2020  . TETANUS/TDAP  06/09/2024  . PNA vac Low Risk Adult  Completed    Health Maintenance  Health Maintenance Due  Topic Date Due  . COVID-19 Vaccine (1) Never done    Colorectal cancer screening: Completed 12/24/18. Repeat every year.  Lung Cancer Screening: (Low Dose CT Chest recommended if Age 63-80 years, 30 pack-year currently smoking OR have quit w/in 15years.) does not qualify.   Additional Screening:  Vision Screening: Recommended annual ophthalmology exams for early detection of glaucoma and other disorders of the eye. Is the patient up to date with their annual eye exam?  Yes  Who is the provider or what is the name of the office in which the patient attends annual eye exams? Dr Dingledein @ [facility] If  pt is not established with a provider, would they like to be referred to a provider to establish care? No .   Dental Screening: Recommended annual dental exams for proper oral hygiene  Community Resource Referral / Chronic Care Management: CRR required this visit?  No   CCM required this visit?  No      Plan:  I have personally reviewed and noted the following in the patient's chart:   . Medical and social history . Use of alcohol, tobacco or illicit drugs  . Current medications and supplements . Functional ability and status . Nutritional status . Physical activity . Advanced directives . List of other physicians . Hospitalizations, surgeries, and ER visits in previous 12 months . Vitals . Screenings to include cognitive, depression, and falls . Referrals and appointments  In addition, I have reviewed and discussed with patient certain preventive protocols, quality metrics, and best practice recommendations. A written personalized care plan for preventive services as well as general preventive health recommendations were provided to patient.     Dimetrius Montfort [place], [place]   [id number]   Nurse Notes: Advised pt to bring in Covid vaccine card to in office apt to update chart.

## 2019-11-24 ENCOUNTER — Ambulatory Visit (INDEPENDENT_AMBULATORY_CARE_PROVIDER_SITE_OTHER): Payer: Medicare Other

## 2019-11-24 ENCOUNTER — Other Ambulatory Visit: Payer: Self-pay

## 2019-11-24 DIAGNOSIS — Z0000 Encounter for general adult medical examination without abnormal findings: Secondary | ICD-10-CM

## 2019-11-24 NOTE — Patient Instructions (Signed)
Mr. Zachary [name] , Thank you for taking time to come for your Medicare Wellness Visit. I appreciate your ongoing commitment to your health goals. Please review the following plan we discussed and let me know if I can assist you in the future.   Screening recommendations/referrals: Colonoscopy: Up to date, due 12/2019 Recommended yearly ophthalmology/optometry visit for glaucoma screening and checkup Recommended yearly dental visit for hygiene and checkup  Vaccinations: Influenza vaccine: Done 03/08/19 Pneumococcal vaccine: Completed series Tdap vaccine: Up to date, due 06/2024 Shingles vaccine: Currently due, declined today.     Advanced directives: Advance directive discussed with you today. Even though you declined this today please call our office should you change your mind and we can give you the proper paperwork for you to fill out.  Conditions/risks identified: Continue current healthy lifestyle with a balanced diet and moderate exercise.   Next appointment: 11/29/19 @ 8:20 AM with Dr Rosanna Randy. Declined scheduling an AWV for 2022 at this time.   Preventive Care 77 Years and Older, Male Preventive care refers to lifestyle choices and visits with your health care provider that can promote health and wellness. What does preventive care include?  A yearly physical exam. This is also called an annual well check.  Dental exams once or twice a year.  Routine eye exams. Ask your health care provider how often you should have your eyes checked.  Personal lifestyle choices, including:  Daily care of your teeth and gums.  Regular physical activity.  Eating a healthy diet.  Avoiding tobacco and drug use.  Limiting alcohol use.  Practicing safe sex.  Taking low doses of aspirin every day.  Taking vitamin and mineral supplements as recommended by your health care provider. What happens during an annual well check? The services and screenings done by your health care provider during  your annual well check will depend on your age, overall health, lifestyle risk factors, and family history of disease. Counseling  Your health care provider may ask you questions about your:  Alcohol use.  Tobacco use.  Drug use.  Emotional well-being.  Home and relationship well-being.  Sexual activity.  Eating habits.  History of falls.  Memory and ability to understand (cognition).  Work and work Statistician. Screening  You may have the following tests or measurements:  Height, weight, and BMI.  Blood pressure.  Lipid and cholesterol levels. These may be checked every 5 years, or more frequently if you are over 60 years old.  Skin check.  Lung cancer screening. You may have this screening every year starting at age 73 if you have a 30-pack-year history of smoking and currently smoke or have quit within the past 15 years.  Fecal occult blood test (FOBT) of the stool. You may have this test every year starting at age 21.  Flexible sigmoidoscopy or colonoscopy. You may have a sigmoidoscopy every 5 years or a colonoscopy every 10 years starting at age 47.  Prostate cancer screening. Recommendations will vary depending on your family history and other risks.  Hepatitis C blood test.  Hepatitis B blood test.  Sexually transmitted disease (STD) testing.  Diabetes screening. This is done by checking your blood sugar (glucose) after you have not eaten for a while (fasting). You may have this done every 1-3 years.  Abdominal aortic aneurysm (AAA) screening. You may need this if you are a current or former smoker.  Osteoporosis. You may be screened starting at age 73 if you are at high risk. Talk with  your health care provider about your test results, treatment options, and if necessary, the need for more tests. Vaccines  Your health care provider may recommend certain vaccines, such as:  Influenza vaccine. This is recommended every year.  Tetanus, diphtheria, and  acellular pertussis (Tdap, Td) vaccine. You may need a Td booster every 10 years.  Zoster vaccine. You may need this after age 52.  Pneumococcal 13-valent conjugate (PCV13) vaccine. One dose is recommended after age 51.  Pneumococcal polysaccharide (PPSV23) vaccine. One dose is recommended after age 7. Talk to your health care provider about which screenings and vaccines you need and how often you need them. This information is not intended to replace advice given to you by your health care provider. Make sure you discuss any questions you have with your health care provider. Document Released: 06/16/2015 Document Revised: 02/07/2016 Document Reviewed: 03/21/2015 Elsevier Interactive Patient Education  2017 [place] Prevention in the Home Falls can cause injuries. They can happen to people of all ages. There are many things you can do to make your home safe and to help prevent falls. What can I do on the outside of my home?  Regularly fix the edges of walkways and driveways and fix any cracks.  Remove anything that might make you trip as you walk through a door, such as a raised step or threshold.  Trim any bushes or trees on the path to your home.  Use bright outdoor lighting.  Clear any walking paths of anything that might make someone trip, such as rocks or tools.  Regularly check to see if handrails are loose or broken. Make sure that both sides of any steps have handrails.  Any raised decks and porches should have guardrails on the edges.  Have any leaves, snow, or ice cleared regularly.  Use sand or salt on walking paths during winter.  Clean up any spills in your garage right away. This includes oil or grease spills. What can I do in the bathroom?  Use night lights.  Install grab bars by the toilet and in the tub and shower. Do not use towel bars as grab bars.  Use non-skid mats or decals in the tub or shower.  If you need to sit down in the shower, use  a plastic, non-slip stool.  Keep the floor dry. Clean up any water that spills on the floor as soon as it happens.  Remove soap buildup in the tub or shower regularly.  Attach bath mats securely with double-sided non-slip rug tape.  Do not have throw rugs and other things on the floor that can make you trip. What can I do in the bedroom?  Use night lights.  Make sure that you have a light by your bed that is easy to reach.  Do not use any sheets or blankets that are too big for your bed. They should not hang down onto the floor.  Have a firm chair that has side arms. You can use this for support while you get dressed.  Do not have throw rugs and other things on the floor that can make you trip. What can I do in the kitchen?  Clean up any spills right away.  Avoid walking on wet floors.  Keep items that you use a lot in easy-to-reach places.  If you need to reach something above you, use a strong step stool that has a grab bar.  Keep electrical cords out of the way.  Do not  use floor polish or wax that makes floors slippery. If you must use wax, use non-skid floor wax.  Do not have throw rugs and other things on the floor that can make you trip. What can I do with my stairs?  Do not leave any items on the stairs.  Make sure that there are handrails on both sides of the stairs and use them. Fix handrails that are broken or loose. Make sure that handrails are as long as the stairways.  Check any carpeting to make sure that it is firmly attached to the stairs. Fix any carpet that is loose or worn.  Avoid having throw rugs at the top or bottom of the stairs. If you do have throw rugs, attach them to the floor with carpet tape.  Make sure that you have a light switch at the top of the stairs and the bottom of the stairs. If you do not have them, ask someone to add them for you. What else can I do to help prevent falls?  Wear shoes that:  Do not have high heels.  Have  rubber bottoms.  Are comfortable and fit you well.  Are closed at the toe. Do not wear sandals.  If you use a stepladder:  Make sure that it is fully opened. Do not climb a closed stepladder.  Make sure that both sides of the stepladder are locked into place.  Ask someone to hold it for you, if possible.  Clearly mark and make sure that you can see:  Any grab bars or handrails.  First and last steps.  Where the edge of each step is.  Use tools that help you move around (mobility aids) if they are needed. These include:  Canes.  Walkers.  Scooters.  Crutches.  Turn on the lights when you go into a dark area. Replace any light bulbs as soon as they burn out.  Set up your furniture so you have a clear path. Avoid moving your furniture around.  If any of your floors are uneven, fix them.  If there are any pets around you, be aware of where they are.  Review your medicines with your doctor. Some medicines can make you feel dizzy. This can increase your chance of falling. Ask your doctor what other things that you can do to help prevent falls. This information is not intended to replace advice given to you by your health care provider. Make sure you discuss any questions you have with your health care provider. Document Released: 03/16/2009 Document Revised: 10/26/2015 Document Reviewed: 06/24/2014 Elsevier Interactive Patient Education  2017 [employer].

## 2019-11-26 NOTE — Progress Notes (Signed)
Trena Platt Cummings,acting as a scribe for [name], MD.,have documented all relevant documentation on the behalf of [name], MD,as directed by  [name], MD while in the presence of [name], MD.  Established patient visit   Patient: Zachary [name]   DOB: 10/28/1940   79 y.o. Male  MRN: [id number] Visit Date: 11/29/2019  Today's healthcare provider: [name], MD   Chief Complaint  Patient presents with  . Hypertension   Subjective    HPI Patient had AWV with [facility] on 11/24/2019. Overall patient is feeling well without complaint. Hypertension, follow-up  BP Readings from Last 3 Encounters:  11/29/19 138/88  11/03/19 126/82  08/24/19 (!) 145/72   Wt Readings from Last 3 Encounters:  11/29/19 185 lb 6.4 oz (84.1 kg)  11/03/19 185 lb (83.9 kg)  08/24/19 193 lb 9.6 oz (87.8 kg)     He was last seen for hypertension 3 weeks ago.  BP at that visit was 126/82. Management since that visit includes; controlled. He reports excellent compliance with treatment. He is not having side effects.  He is exercising. He is adherent to low salt diet.   Outside blood pressures are not being checked.  He does not smoke.  Use of agents associated with hypertension: NSAIDS.   ---------------------------------------------------------------------------------------------------  Peripheral edema- right lower leg  - Primary From 11/03/2019-Patient has continued with the Lasix and wearing support hose. Recommend he continues to wear the hose, but on days he wants he can go without.   Obtain renal panel today.  Will discontinue the Lasix for now.  Social History   Tobacco Use  . Smoking status: Never Smoker  . Smokeless tobacco: Never Used  Vaping Use  . Vaping Use: Never used  Substance Use Topics  . Alcohol use: No    Alcohol/week: 0.0 standard drinks  . Drug use: No       Medications: Outpatient Medications Prior to Visit    Medication Sig  . aspirin EC 81 MG tablet Take 81 mg by mouth at bedtime.  Marland Kitchen atorvastatin (LIPITOR) 20 MG tablet Take 1 tablet (20 mg total) by mouth at bedtime.  . Cyanocobalamin (VITAMIN B-12) 500 MCG SUBL Place 1 tablet (500 mcg total) under the tongue daily.  . furosemide (LASIX) 20 MG tablet Take 1 tablet (20 mg total) by mouth every other day.  . lisinopril (ZESTRIL) 20 MG tablet Take 1 tablet (20 mg total) by mouth daily. Take 1/2 tablet daily.  . meclizine (ANTIVERT) 25 MG tablet Take 1 tablet (25 mg total) by mouth 3 (three) times daily as needed for dizziness.   No facility-administered medications prior to visit.    Review of Systems  Constitutional: Negative for appetite change, chills and fever.  HENT: Negative.   Eyes: Negative.   Respiratory: Negative for chest tightness, shortness of breath and wheezing.   Cardiovascular: Negative for chest pain and palpitations.  Gastrointestinal: Negative for abdominal pain, nausea and vomiting.  Endocrine: Negative.   Musculoskeletal: Negative.   Allergic/Immunologic: Negative.   Neurological: Negative.   Hematological: Negative.   Psychiatric/Behavioral: Negative.        Objective    BP 138/88 (BP Location: Right Arm, Patient Position: Sitting, Cuff Size: Normal)   Pulse (!) 54   Temp (!) 97.3 F (36.3 C) (Temporal)   Ht 6\' 2"  (1.88 m)   Wt 185 lb 6.4 oz (84.1 kg)   BMI 23.80 kg/m  BP Readings from  Last 3 Encounters:  11/29/19 138/88  11/03/19 126/82  08/24/19 (!) 145/72   Wt Readings from Last 3 Encounters:  11/29/19 185 lb 6.4 oz (84.1 kg)  11/03/19 185 lb (83.9 kg)  08/24/19 193 lb 9.6 oz (87.8 kg)      Physical Exam Vitals reviewed.  Constitutional:      Appearance: He is well-developed.  HENT:     Head: Normocephalic and atraumatic.  Eyes:     General: No scleral icterus.    Conjunctiva/sclera: Conjunctivae normal.  Neck:     Thyroid: No thyromegaly.  Cardiovascular:     Rate and Rhythm: Normal  rate and regular rhythm.     Heart sounds: Normal heart sounds.  Pulmonary:     Effort: Pulmonary effort is normal.     Breath sounds: Normal breath sounds.  Abdominal:     Palpations: Abdomen is soft.  Musculoskeletal:     Right lower leg: Edema present.     Comments: Trace left lower extremity edema and 1+ right lower extremity edema above the ankle.  Lymphadenopathy:     Cervical: No cervical adenopathy.  Skin:    General: Skin is warm and dry.  Neurological:     General: No focal deficit present.     Mental Status: He is alert and oriented to person, place, and time.     Comments: I noted right > left hand tremor at rest.   Psychiatric:        Mood and Affect: Mood normal.        Behavior: Behavior normal.        Thought Content: Thought content normal.        Judgment: Judgment normal.       No results found for any visits on 11/29/19.  Assessment & Plan     1. Essential hypertension Good control.  On Zestril.  2. Peripheral edema- right lower leg  On Lasix as needed.  3. Benign essential tremor Follow clinically.   No follow-ups on file.      I, [name], MD, have reviewed all documentation for this visit. The documentation on 12/03/19 for the exam, diagnosis, procedures, and orders are all accurate and complete.    Shenia Alan Cranford Mon, MD  [facility] [phone] (phone) [phone] (fax)  [place]

## 2019-11-29 ENCOUNTER — Encounter: Payer: Self-pay | Admitting: Family Medicine

## 2019-11-29 ENCOUNTER — Other Ambulatory Visit: Payer: Self-pay

## 2019-11-29 ENCOUNTER — Ambulatory Visit (INDEPENDENT_AMBULATORY_CARE_PROVIDER_SITE_OTHER): Payer: Medicare Other | Admitting: Family Medicine

## 2019-11-29 VITALS — BP 138/88 | HR 54 | Temp 97.3°F | Ht 74.0 in | Wt 185.4 lb

## 2019-11-29 DIAGNOSIS — G250 Essential tremor: Secondary | ICD-10-CM | POA: Diagnosis not present

## 2019-11-29 DIAGNOSIS — I10 Essential (primary) hypertension: Secondary | ICD-10-CM

## 2019-11-29 DIAGNOSIS — R609 Edema, unspecified: Secondary | ICD-10-CM

## 2019-12-02 ENCOUNTER — Other Ambulatory Visit: Payer: Self-pay

## 2019-12-02 ENCOUNTER — Encounter: Payer: Self-pay | Admitting: Gastroenterology

## 2019-12-09 ENCOUNTER — Other Ambulatory Visit
Admission: RE | Admit: 2019-12-09 | Discharge: 2019-12-09 | Disposition: A | Discharge status: Home / Self Care | Payer: Medicare Other | Source: Ambulatory Visit | Attending: Gastroenterology | Admitting: Gastroenterology

## 2019-12-09 ENCOUNTER — Other Ambulatory Visit: Payer: Self-pay

## 2019-12-09 DIAGNOSIS — Z20822 Contact with and (suspected) exposure to covid-19: Secondary | ICD-10-CM | POA: Insufficient documentation

## 2019-12-09 DIAGNOSIS — Z01812 Encounter for preprocedural laboratory examination: Secondary | ICD-10-CM | POA: Diagnosis not present

## 2019-12-09 LAB — SARS CORONAVIRUS 2 (TAT 6-24 HRS): SARS Coronavirus 2: NEGATIVE

## 2019-12-10 NOTE — Discharge Instructions (Signed)
General Anesthesia, Adult, Care After This sheet gives you information about how to care for yourself after your procedure. Your health care provider may also give you more specific instructions. If you have problems or questions, contact your health care provider. What can I expect after the procedure? After the procedure, the following side effects are common:  Pain or discomfort at the IV site.  Nausea.  Vomiting.  Sore throat.  Trouble concentrating.  Feeling cold or chills.  Weak or tired.  Sleepiness and fatigue.  Soreness and body aches. These side effects can affect parts of the body that were not involved in surgery. Follow these instructions at home:  For at least 24 hours after the procedure:  Have a responsible adult stay with you. It is important to have someone help care for you until you are awake and alert.  Rest as needed.  Do not: ? Participate in activities in which you could fall or become injured. ? Drive. ? Use heavy machinery. ? Drink alcohol. ? Take sleeping pills or medicines that cause drowsiness. ? Make important decisions or sign legal documents. ? Take care of children on your own. Eating and drinking  Follow any instructions from your health care provider about eating or drinking restrictions.  When you feel hungry, start by eating small amounts of foods that are soft and easy to digest (bland), such as toast. Gradually return to your regular diet.  Drink enough fluid to keep your urine pale yellow.  If you vomit, rehydrate by drinking water, juice, or clear broth. General instructions  If you have sleep apnea, surgery and certain medicines can increase your risk for breathing problems. Follow instructions from your health care provider about wearing your sleep device: ? Anytime you are sleeping, including during daytime naps. ? While taking prescription pain medicines, sleeping medicines, or medicines that make you drowsy.  Return to  your normal activities as told by your health care provider. Ask your health care provider what activities are safe for you.  Take over-the-counter and prescription medicines only as told by your health care provider.  If you smoke, do not smoke without supervision.  Keep all follow-up visits as told by your health care provider. This is important. Contact a health care provider if:  You have nausea or vomiting that does not get better with medicine.  You cannot eat or drink without vomiting.  You have pain that does not get better with medicine.  You are unable to pass urine.  You develop a skin rash.  You have a fever.  You have redness around your IV site that gets worse. Get help right away if:  You have difficulty breathing.  You have chest pain.  You have blood in your urine or stool, or you vomit blood. Summary  After the procedure, it is common to have a sore throat or nausea. It is also common to feel tired.  Have a responsible adult stay with you for the first 24 hours after general anesthesia. It is important to have someone help care for you until you are awake and alert.  When you feel hungry, start by eating small amounts of foods that are soft and easy to digest (bland), such as toast. Gradually return to your regular diet.  Drink enough fluid to keep your urine pale yellow.  Return to your normal activities as told by your health care provider. Ask your health care provider what activities are safe for you. This information is not   intended to replace advice given to you by your health care provider. Make sure you discuss any questions you have with your health care provider. Document Revised: 05/23/2017 Document Reviewed: 01/03/2017 Elsevier Patient Education  2020 [employer]vier Inc[employer]  

## 2019-12-13 ENCOUNTER — Encounter
Admission: RE | Disposition: A | Discharge status: Home / Self Care | Payer: Self-pay | Source: Home / Self Care | Attending: Gastroenterology

## 2019-12-13 ENCOUNTER — Ambulatory Visit
Admission: RE | Admit: 2019-12-13 | Discharge: 2019-12-13 | Disposition: A | Discharge status: Home / Self Care | Payer: Medicare Other | Attending: Gastroenterology | Admitting: Gastroenterology

## 2019-12-13 ENCOUNTER — Encounter: Payer: Self-pay | Admitting: Gastroenterology

## 2019-12-13 ENCOUNTER — Other Ambulatory Visit: Payer: Self-pay

## 2019-12-13 ENCOUNTER — Ambulatory Visit: Payer: Medicare Other | Admitting: Anesthesiology

## 2019-12-13 ENCOUNTER — Other Ambulatory Visit: Payer: Self-pay | Admitting: Gastroenterology

## 2019-12-13 DIAGNOSIS — Z8719 Personal history of other diseases of the digestive system: Secondary | ICD-10-CM | POA: Diagnosis not present

## 2019-12-13 DIAGNOSIS — Z860100 Personal history of colon polyps, unspecified: Secondary | ICD-10-CM

## 2019-12-13 DIAGNOSIS — I2510 Atherosclerotic heart disease of native coronary artery without angina pectoris: Secondary | ICD-10-CM | POA: Insufficient documentation

## 2019-12-13 DIAGNOSIS — Z7982 Long term (current) use of aspirin: Secondary | ICD-10-CM | POA: Insufficient documentation

## 2019-12-13 DIAGNOSIS — D123 Benign neoplasm of transverse colon: Secondary | ICD-10-CM | POA: Insufficient documentation

## 2019-12-13 DIAGNOSIS — Z79899 Other long term (current) drug therapy: Secondary | ICD-10-CM | POA: Insufficient documentation

## 2019-12-13 DIAGNOSIS — Z1211 Encounter for screening for malignant neoplasm of colon: Secondary | ICD-10-CM | POA: Insufficient documentation

## 2019-12-13 DIAGNOSIS — I10 Essential (primary) hypertension: Secondary | ICD-10-CM | POA: Insufficient documentation

## 2019-12-13 DIAGNOSIS — K5730 Diverticulosis of large intestine without perforation or abscess without bleeding: Secondary | ICD-10-CM | POA: Insufficient documentation

## 2019-12-13 DIAGNOSIS — K635 Polyp of colon: Secondary | ICD-10-CM

## 2019-12-13 DIAGNOSIS — D120 Benign neoplasm of cecum: Secondary | ICD-10-CM | POA: Diagnosis not present

## 2019-12-13 DIAGNOSIS — K648 Other hemorrhoids: Secondary | ICD-10-CM | POA: Diagnosis not present

## 2019-12-13 HISTORY — PX: COLONOSCOPY WITH PROPOFOL: SHX5780

## 2019-12-13 HISTORY — PX: POLYPECTOMY: SHX5525

## 2019-12-13 SURGERY — COLONOSCOPY WITH PROPOFOL
Anesthesia: General | Site: Rectum

## 2019-12-13 MED ORDER — LACTATED RINGERS IV SOLN: INTRAVENOUS | Status: DC

## 2019-12-13 MED ORDER — SODIUM CHLORIDE 0.9 % IV SOLN: INTRAVENOUS | Status: DC

## 2019-12-13 MED ORDER — LIDOCAINE HCL (CARDIAC) PF 100 MG/5ML IV SOSY
PREFILLED_SYRINGE | INTRAVENOUS | Status: DC | PRN
  Administered 2019-12-13: 30 mg via INTRAVENOUS

## 2019-12-13 MED ORDER — PROPOFOL 10 MG/ML IV BOLUS
INTRAVENOUS | Status: DC | PRN
  Administered 2019-12-13: 100 mg via INTRAVENOUS
  Administered 2019-12-13 (×6): 40 mg via INTRAVENOUS

## 2019-12-13 MED ORDER — ACETAMINOPHEN 325 MG PO TABS: 325.0000 mg | ORAL_TABLET | Freq: Once | ORAL | Status: DC

## 2019-12-13 MED ORDER — STERILE WATER FOR IRRIGATION IR SOLN
Status: DC | PRN
  Administered 2019-12-13: 50 mL

## 2019-12-13 MED ORDER — ACETAMINOPHEN 160 MG/5ML PO SOLN: 325.0000 mg | Freq: Once | ORAL | Status: DC

## 2019-12-13 SURGICAL SUPPLY — 7 items
GOWN CVR UNV OPN BCK APRN NK (MISCELLANEOUS) ×2 IMPLANT
GOWN ISOL THUMB LOOP REG UNIV (MISCELLANEOUS) ×2
KIT ENDO PROCEDURE OLY (KITS) ×2 IMPLANT
MANIFOLD NEPTUNE II (INSTRUMENTS) ×2 IMPLANT
SNARE SHORT THROW 13M SML OVAL (MISCELLANEOUS) ×2 IMPLANT
TRAP ETRAP POLY (MISCELLANEOUS) ×2 IMPLANT
WATER STERILE IRR 250ML POUR (IV SOLUTION) ×2 IMPLANT

## 2019-12-13 NOTE — Anesthesia Preprocedure Evaluation (Signed)
Anesthesia Evaluation  Patient identified by MRN, date of birth, ID band Patient awake    Reviewed: Allergy & Precautions, H&P , NPO status , Patient's Chart, lab work & pertinent test results  Airway Mallampati: II  TM Distance: >3 FB Neck ROM: full    Dental no notable dental hx.    Pulmonary    Pulmonary exam normal breath sounds clear to auscultation       Cardiovascular hypertension, + CAD  Normal cardiovascular exam Rhythm:regular Rate:Normal     Neuro/Psych    GI/Hepatic   Endo/Other    Renal/GU      Musculoskeletal   Abdominal   Peds  Hematology   Anesthesia Other Findings   Reproductive/Obstetrics                             Anesthesia Physical Anesthesia Plan  ASA: II  Anesthesia Plan: General   Post-op Pain Management:    Induction: Intravenous  PONV Risk Score and Plan: 2 and Treatment may vary due to age or medical condition and Propofol infusion  Airway Management Planned: Natural Airway  Additional Equipment:   Intra-op Plan:   Post-operative Plan:   Informed Consent: I have reviewed the patients History and Physical, chart, labs and discussed the procedure including the risks, benefits and alternatives for the proposed anesthesia with the patient or authorized representative who has indicated his/her understanding and acceptance.     Dental Advisory Given  Plan Discussed with: CRNA  Anesthesia Plan Comments:         Anesthesia Quick Evaluation

## 2019-12-13 NOTE — Transfer of Care (Signed)
Immediate Anesthesia Transfer of Care Note  Patient: Zachary [name]  Procedure(s) Performed: COLONOSCOPY WITH BIOPSY  (N/A Rectum) POLYPECTOMY (N/A Rectum)  Patient Location: PACU  Anesthesia Type: General  Level of Consciousness: awake, alert  and patient cooperative  Airway and Oxygen Therapy: Patient Spontanous Breathing and Patient connected to supplemental oxygen  Post-op Assessment: Post-op Vital signs reviewed, Patient's Cardiovascular Status Stable, Respiratory Function Stable, Patent Airway and No signs of Nausea or vomiting  Post-op Vital Signs: Reviewed and stable  Complications: No complications documented.

## 2019-12-13 NOTE — Anesthesia Procedure Notes (Signed)
Date/Time: 12/13/2019 8:18 AM Performed by: Cameron Ali, CRNA Pre-anesthesia Checklist: Patient identified, Emergency Drugs available, Suction available, Timeout performed and Patient being monitored Patient Re-evaluated:Patient Re-evaluated prior to induction Oxygen Delivery Method: Nasal cannula Placement Confirmation: positive ETCO2

## 2019-12-13 NOTE — H&P (Signed)
Lucilla Lame, MD [address] [place], [place] [address] [phone] Fax : [phone]  Primary Care Physician:  Jerrol Banana., MD Primary Gastroenterologist:  Dr. Allen Norris  Pre-Procedure History & Physical: HPI:  LYNDALL WINDT is a 79 y.o. male is here for an colonoscopy.   Past Medical History:  Diagnosis Date  . B12 deficiency anemia 08/03/2019  . Hypercholesterolemia   . Hypertension   . Vertigo    last episode approx 09/2018    Past Surgical History:  Procedure Laterality Date  . APPENDECTOMY    . CATARACT EXTRACTION Bilateral   . COLONOSCOPY WITH PROPOFOL N/A 12/24/2018   Procedure: COLONOSCOPY WITH PROPOFOL;  Surgeon: Lucilla Lame, MD;  Location: [facility] ENDOSCOPY;  Service: Endoscopy;  Laterality: N/A;  . PLEURAL SCARIFICATION  05/2015    Prior to Admission medications   Medication Sig Start Date End Date Taking? Authorizing Provider  aspirin EC 81 MG tablet Take 81 mg by mouth at bedtime.   Yes [provider]  atorvastatin (LIPITOR) 20 MG tablet Take 1 tablet (20 mg total) by mouth at bedtime. 11/03/19  Yes Jerrol Banana., MD  Cyanocobalamin (VITAMIN B-12) 500 MCG SUBL Place 1 tablet (500 mcg total) under the tongue daily. 07/02/19  Yes Flinchum, Kelby Aline, FNP  lisinopril (ZESTRIL) 20 MG tablet Take 1 tablet (20 mg total) by mouth daily. Take 1/2 tablet daily. 11/03/19  Yes Jerrol Banana., MD  meclizine (ANTIVERT) 25 MG tablet Take 1 tablet (25 mg total) by mouth 3 (three) times daily as needed for dizziness. 11/03/19  Yes Jerrol Banana., MD  furosemide (LASIX) 20 MG tablet Take 1 tablet (20 mg total) by mouth every other day. Patient not taking: Reported on 12/02/2019 07/27/19   Doreen Beam, FNP    Allergies as of 11/09/2019  . (No Known Allergies)    Family History  Problem Relation Age of Onset  . Hypertension Mother   . Hyperlipidemia Mother   . Heart attack Father   . Hypertension Father     . CVA Father   . ALS Brother   . Prostate cancer Brother     Social History   Socioeconomic History  . Marital status: Married    Spouse name: Not on file  . Number of children: 0  . Years of education: Not on file  . Highest education level: 12th grade  Occupational History  . Occupation: retired  Tobacco Use  . Smoking status: Never Smoker  . Smokeless tobacco: Never Used  Vaping Use  . Vaping Use: Never used  Substance and Sexual Activity  . Alcohol use: No    Alcohol/week: 0.0 standard drinks  . Drug use: No  . Sexual activity: Not on file  Other Topics Concern  . Not on file  Social History Narrative  . Not on file   Social Determinants of Health   Financial Resource Strain: Low Risk   . Difficulty of Paying Living Expenses: Not hard at all  Food Insecurity: No Food Insecurity  . Worried About Charity fundraiser in the Last Year: Never true  . Ran Out of Food in the Last Year: Never true  Transportation Needs: No Transportation Needs  . Lack of Transportation (Medical): No  . Lack of Transportation (Non-Medical): No  Physical Activity: Inactive  . Days of Exercise per Week: 0 days  . Minutes of Exercise per Session: 0 min  Stress: No Stress Concern Present  .  Feeling of Stress : Not at all  Social Connections: Moderately Isolated  . Frequency of Communication with Friends and Family: Never  . Frequency of Social Gatherings with Friends and Family: Never  . Attends Religious Services: Never  . Active Member of Clubs or Organizations: Yes  . Attends Archivist Meetings: Never  . Marital Status: Married  Human resources officer Violence: Not At Risk  . Fear of Current or Ex-Partner: No  . Emotionally Abused: No  . Physically Abused: No  . Sexually Abused: No    Review of Systems: See HPI, otherwise negative ROS  Physical Exam: Ht 6\' 2"  (1.88 m)   Wt 83.9 kg   BMI 23.75 kg/m  General:   Alert,  pleasant and cooperative in NAD Head:   Normocephalic and atraumatic. Neck:  Supple; no masses or thyromegaly. Lungs:  Clear throughout to auscultation.    Heart:  Regular rate and rhythm. Abdomen:  Soft, nontender and nondistended. Normal bowel sounds, without guarding, and without rebound.   Neurologic:  Alert and  oriented x4;  grossly normal neurologically.  Impression/Plan: DEITRICH STEVE is here for an colonoscopy to be performed for history of a large adenomatous polyps in 2020  Risks, benefits, limitations, and alternatives regarding  colonoscopy have been reviewed with the patient.  Questions have been answered.  All parties agreeable.   Lucilla Lame, MD  12/13/2019, 7:36 AM

## 2019-12-13 NOTE — Anesthesia Postprocedure Evaluation (Signed)
Anesthesia Post Note  Patient: Zachary [name]  Procedure(s) Performed: COLONOSCOPY WITH BIOPSY  (N/A Rectum) POLYPECTOMY (N/A Rectum)     Patient location during evaluation: PACU Anesthesia Type: General Level of consciousness: awake and alert and oriented Pain management: satisfactory to patient Vital Signs Assessment: post-procedure vital signs reviewed and stable Respiratory status: spontaneous breathing, nonlabored ventilation and respiratory function stable Cardiovascular status: blood pressure returned to baseline and stable Postop Assessment: Adequate PO intake and No signs of nausea or vomiting Anesthetic complications: no   No complications documented.  Raliegh Ip

## 2019-12-13 NOTE — Op Note (Signed)
[facility] Patient Name: Zachary [name] Procedure Date: 12/13/2019 8:16 AM MRN: [id number] Account #: [id number] Date of Birth: February 26, 1941 Admit Type: Outpatient Age: 79 Room: [facility] OR ROOM 01 Gender: Male Note Status: Finalized Procedure:             Colonoscopy Indications:           High risk colon cancer surveillance: Personal history                         of colonic polyps Providers:             Lucilla Lame MD, MD Referring MD:          Janine Ores. Rosanna Randy, MD (Referring MD) Medicines:             Propofol per Anesthesia Complications:         No immediate complications. Procedure:             Pre-Anesthesia Assessment:                        - Prior to the procedure, a History and Physical was                         performed, and patient medications and allergies were                         reviewed. The patient's tolerance of previous                         anesthesia was also reviewed. The risks and benefits                         of the procedure and the sedation options and risks                         were discussed with the patient. All questions were                         answered, and informed consent was obtained. Prior                         Anticoagulants: The patient has taken no previous                         anticoagulant or antiplatelet agents. ASA Grade                         Assessment: II - A patient with mild systemic disease.                         After reviewing the risks and benefits, the patient                         was deemed in satisfactory condition to undergo the                         procedure.  After obtaining informed consent, the colonoscope was                         passed under direct vision. Throughout the procedure,                         the patient's blood pressure, pulse, and oxygen                         saturations were monitored continuously. The was                          introduced through the anus and advanced to the the                         cecum, identified by appendiceal orifice and ileocecal                         valve. The colonoscopy was performed without                         difficulty. The patient tolerated the procedure well.                         The quality of the bowel preparation was good. Findings:      The perianal and digital rectal examinations were normal.      Three sessile polyps were found in the transverse colon. The polyps were       3 to 4 mm in size. These polyps were removed with a cold snare.       Resection and retrieval were complete.      A 5 mm polyp was found in the cecum. The polyp was sessile. The polyp       was removed with a cold snare. Resection and retrieval were complete.      Multiple small-mouthed diverticula were found in the sigmoid colon.      Non-bleeding internal hemorrhoids were found during retroflexion. The       hemorrhoids were Grade I (internal hemorrhoids that do not prolapse). Impression:            - Three 3 to 4 mm polyps in the transverse colon,                         removed with a cold snare. Resected and retrieved.                        - One 5 mm polyp in the cecum, removed with a cold                         snare. Resected and retrieved.                        - Diverticulosis in the sigmoid colon.                        - Non-bleeding internal hemorrhoids. Recommendation:        - Discharge patient to home.                        -  Resume previous diet.                        - Continue present medications.                        - Await pathology results. Procedure Code(s):     --- Professional ---                        [phone], Colonoscopy, flexible; with removal of                         tumor(s), polyp(s), or other lesion(s) by snare                         technique Diagnosis Code(s):     --- Professional ---                        Z86.010, Personal  history of colonic polyps                        K63.5, Polyp of colon CPT copyright 2019 American Medical Association. All rights reserved. The codes documented in this report are preliminary and upon coder review may  be revised to meet current compliance requirements. Lucilla Lame MD, MD 12/13/2019 8:39:01 AM This report has been signed electronically. Number of Addenda: 0 Note Initiated On: 12/13/2019 8:16 AM Scope Withdrawal Time: 0 hours 11 minutes 0 seconds  Total Procedure Duration: 0 hours 15 minutes 32 seconds  Estimated Blood Loss:  Estimated blood loss: none.      [facility]

## 2019-12-14 ENCOUNTER — Encounter: Payer: Self-pay | Admitting: Gastroenterology

## 2019-12-14 LAB — SURGICAL PATHOLOGY

## 2019-12-15 ENCOUNTER — Encounter: Payer: Self-pay | Admitting: Gastroenterology

## 2019-12-16 LAB — PATHOLOGY

## 2020-02-08 DIAGNOSIS — H59811 Chorioretinal scars after surgery for detachment, right eye: Secondary | ICD-10-CM | POA: Diagnosis not present

## 2020-02-10 DIAGNOSIS — Z86018 Personal history of other benign neoplasm: Secondary | ICD-10-CM | POA: Diagnosis not present

## 2020-02-10 DIAGNOSIS — L570 Actinic keratosis: Secondary | ICD-10-CM | POA: Diagnosis not present

## 2020-02-10 DIAGNOSIS — L821 Other seborrheic keratosis: Secondary | ICD-10-CM | POA: Diagnosis not present

## 2020-02-10 DIAGNOSIS — Z872 Personal history of diseases of the skin and subcutaneous tissue: Secondary | ICD-10-CM | POA: Diagnosis not present

## 2020-02-10 DIAGNOSIS — Z85828 Personal history of other malignant neoplasm of skin: Secondary | ICD-10-CM | POA: Diagnosis not present

## 2020-02-10 DIAGNOSIS — L578 Other skin changes due to chronic exposure to nonionizing radiation: Secondary | ICD-10-CM | POA: Diagnosis not present

## 2020-02-14 ENCOUNTER — Ambulatory Visit
Admission: RE | Admit: 2020-02-14 | Discharge: 2020-02-14 | Disposition: A | Discharge status: Home / Self Care | Payer: Medicare Other | Source: Ambulatory Visit | Attending: Family Medicine | Admitting: Family Medicine

## 2020-02-14 ENCOUNTER — Ambulatory Visit: Payer: Self-pay

## 2020-02-14 ENCOUNTER — Ambulatory Visit (INDEPENDENT_AMBULATORY_CARE_PROVIDER_SITE_OTHER): Payer: Medicare Other | Admitting: Family Medicine

## 2020-02-14 ENCOUNTER — Ambulatory Visit
Admission: RE | Admit: 2020-02-14 | Discharge: 2020-02-14 | Disposition: A | Discharge status: Home / Self Care | Payer: Medicare Other | Attending: Family Medicine | Admitting: Family Medicine

## 2020-02-14 ENCOUNTER — Other Ambulatory Visit: Payer: Self-pay

## 2020-02-14 ENCOUNTER — Encounter: Payer: Self-pay | Admitting: Family Medicine

## 2020-02-14 VITALS — BP 132/68 | HR 64 | Temp 98.2°F | Resp 18 | Ht 74.0 in | Wt 181.0 lb

## 2020-02-14 DIAGNOSIS — J90 Pleural effusion, not elsewhere classified: Secondary | ICD-10-CM | POA: Diagnosis not present

## 2020-02-14 DIAGNOSIS — R042 Hemoptysis: Secondary | ICD-10-CM | POA: Diagnosis not present

## 2020-02-14 DIAGNOSIS — R05 Cough: Secondary | ICD-10-CM | POA: Diagnosis not present

## 2020-02-14 DIAGNOSIS — K449 Diaphragmatic hernia without obstruction or gangrene: Secondary | ICD-10-CM | POA: Diagnosis not present

## 2020-02-14 DIAGNOSIS — R059 Cough, unspecified: Secondary | ICD-10-CM

## 2020-02-14 NOTE — Progress Notes (Signed)
I,Zachary [name],acting as a scribe for Zachary Durie, MD.,have documented all relevant documentation on the behalf of Zachary Durie, MD,as directed by  Zachary Durie, MD while in the presence of Zachary Durie, MD.   Established patient visit   Patient: Zachary [name]   DOB: 02-07-1941   79 y.o. Male  MRN: [id number] Visit Date: 02/14/2020  Today's healthcare provider: [name], MD   Chief Complaint  Patient presents with  . Hemoptysis   Subjective    HPI  Patient states he coughed several times today and saw blood in sputum. Patient states he usually has a cough after he does yard work. Patient didi yard work 2 days ago. Her has some allergies. Otherwise he feels well.      Medications: Outpatient Medications Prior to Visit  Medication Sig  . aspirin EC 81 MG tablet Take 81 mg by mouth at bedtime.  Marland Kitchen atorvastatin (LIPITOR) 20 MG tablet Take 1 tablet (20 mg total) by mouth at bedtime.  . Cyanocobalamin (VITAMIN B-12) 500 MCG SUBL Place 1 tablet (500 mcg total) under the tongue daily.  Marland Kitchen lisinopril (ZESTRIL) 20 MG tablet Take 1 tablet (20 mg total) by mouth daily. Take 1/2 tablet daily.  . meclizine (ANTIVERT) 25 MG tablet Take 1 tablet (25 mg total) by mouth 3 (three) times daily as needed for dizziness.  . furosemide (LASIX) 20 MG tablet Take 1 tablet (20 mg total) by mouth every other day. (Patient not taking: Reported on 12/02/2019)   No facility-administered medications prior to visit.    Review of Systems  Constitutional: Negative for appetite change, chills and fever.  Respiratory: Negative for chest tightness, shortness of breath and wheezing.   Cardiovascular: Negative for chest pain and palpitations.  Gastrointestinal: Negative for abdominal pain, nausea and vomiting.       Objective    BP 132/68 (BP Location: Left Arm, Patient Position: Sitting, Cuff Size: Large)   Pulse 64   Temp 98.2 F (36.8 C) (Oral)   Resp 18   Ht 6'  2" (1.88 m)   Wt 181 lb (82.1 kg)   SpO2 97%   BMI 23.24 kg/m  BP Readings from Last 3 Encounters:  02/14/20 132/68  12/13/19 128/75  11/29/19 138/88   Wt Readings from Last 3 Encounters:  02/14/20 181 lb (82.1 kg)  12/13/19 172 lb (78 kg)  11/29/19 185 lb 6.4 oz (84.1 kg)      Physical Exam Vitals reviewed.  Constitutional:      Appearance: He is well-developed.  HENT:     Head: Normocephalic and atraumatic.  Eyes:     General: No scleral icterus.    Conjunctiva/sclera: Conjunctivae normal.  Neck:     Thyroid: No thyromegaly.  Cardiovascular:     Rate and Rhythm: Normal rate and regular rhythm.     Heart sounds: Normal heart sounds.  Pulmonary:     Effort: Pulmonary effort is normal.     Breath sounds: Normal breath sounds.  Abdominal:     Palpations: Abdomen is soft.  Musculoskeletal:     Right lower leg: Edema present.     Comments: Trace left lower extremity edema and 1+ right lower extremity edema above the ankle.  Lymphadenopathy:     Cervical: No cervical adenopathy.  Skin:    General: Skin is warm and dry.  Neurological:     General: No focal deficit present.     Mental Status: He is alert and  oriented to person, place, and time.  Psychiatric:        Mood and Affect: Mood normal.        Behavior: Behavior normal.        Thought Content: Thought content normal.        Judgment: Judgment normal.       No results found for any visits on 02/14/20.  Assessment & Plan     1. Hemoptysis  Might need pulmonary referral . Follow for now. - DG Chest 2 View  2. Cough Resolving following pollen/dust exposure. - DG Chest 2 View   No follow-ups on file.         Letisha Yera Cranford Mon, MD  [facility] [phone] (phone) [phone] (fax)  [place]

## 2020-02-14 NOTE — Telephone Encounter (Signed)
Pt. Coughed up blood this morning. 3 "little spots of blood this morning." Mucus is clear. No fever or cough. "I feel fine, it scared me." Spoke with Clarene Critchley in the practice and appointment given.  Reason for Disposition . [1] More than one episode of coughing up blood AND [2] < 1 tablespoon (15 ml) of pure red blood  Answer Assessment - Initial Assessment Questions 1. ONSET: "When did the cough begin?"      This morning 2. SEVERITY: "How bad is the cough today?" "Did the blood appear after a coughing spell?"      Mild cough 3. SPUTUM: "Describe the color of your sputum" (none, dry cough; clear, white, yellow, green)     Clear 4. HEMOPTYSIS: "How much blood?" (flecks, streaks, tablespoons, etc.)     Streaks 5. DIFFICULTY BREATHING: "Are you having difficulty breathing?" If Yes, ask: "How bad is it?" (e.g., mild, moderate, severe)    - MILD: No SOB at rest, mild SOB with walking, speaks normally in sentences, can lay down, no retractions, pulse < 100.    - MODERATE: SOB at rest, SOB with minimal exertion and prefers to sit, cannot lie down flat, speaks in phrases, mild retractions, audible wheezing, pulse 100-120.    - SEVERE: Very SOB at rest, speaks in single words, struggling to breathe, sitting hunched forward, retractions, pulse > 120      No 6. FEVER: "Do you have a fever?" If Yes, ask: "What is your temperature, how was it measured, and when did it start?"     No 7. CARDIAC HISTORY: "Do you have any history of heart disease?" (e.g., heart attack, congestive heart failure)      No 8. LUNG HISTORY: "Do you have any history of lung disease?"  (e.g., pulmonary embolus, asthma, emphysema)     No 9. PE RISK FACTORS: "Do you have a history of blood clots?" (or: recent major surgery, recent prolonged travel, bedridden)     No 10. OTHER SYMPTOMS: "Do you have any other symptoms?" (e.g., runny nose, wheezing, chest pain)       No 11. PREGNANCY: "Is there any chance you are pregnant?" "When  was your last menstrual period?"       n/a 12. TRAVEL: "Have you traveled out of the country in the last month?" (e.g., travel history, exposures)       No  Protocols used: COUGHING UP BLOOD-A-AH

## 2020-02-15 ENCOUNTER — Ambulatory Visit: Payer: Medicare Other | Admitting: Family Medicine

## 2020-02-22 ENCOUNTER — Encounter: Payer: Medicare Other | Admitting: Family Medicine

## 2020-02-29 NOTE — Progress Notes (Signed)
Complete physical exam   Patient: Zachary [name]   DOB: August 09, 1940   79 y.o. Male  MRN: [id number] Visit Date: 03/01/2020  Today's healthcare provider: [name], MD   Chief Complaint  Patient presents with  . Annual Exam   Subjective    Zachary [name] is a 79 y.o. male who presents today for .  Annual wellness visit He reports consuming a general diet. Home exercise routine includes mowing/ yardwork. He generally feels well. He reports sleeping well. He does not have additional problems to discuss today.   HPI  Overall the patient feels very well. Patient had AWV with [facility] 11/24/2019.  Past Medical History:  Diagnosis Date  . B12 deficiency anemia 08/03/2019  . Hypercholesterolemia   . Hypertension   . Vertigo    last episode approx 09/2018   Past Surgical History:  Procedure Laterality Date  . APPENDECTOMY    . CATARACT EXTRACTION Bilateral   . COLONOSCOPY WITH PROPOFOL N/A 12/24/2018   Procedure: COLONOSCOPY WITH PROPOFOL;  Surgeon: Lucilla Lame, MD;  Location: [facility] ENDOSCOPY;  Service: Endoscopy;  Laterality: N/A;  . COLONOSCOPY WITH PROPOFOL N/A 12/13/2019   Procedure: COLONOSCOPY WITH BIOPSY ;  Surgeon: Lucilla Lame, MD;  Location: [place];  Service: Endoscopy;  Laterality: N/A;  priority 3  . PLEURAL SCARIFICATION  05/2015  . POLYPECTOMY N/A 12/13/2019   Procedure: POLYPECTOMY;  Surgeon: Lucilla Lame, MD;  Location: [place];  Service: Endoscopy;  Laterality: N/A;   Social History   Socioeconomic History  . Marital status: Married    Spouse name: Not on file  . Number of children: 0  . Years of education: Not on file  . Highest education level: 12th grade  Occupational History  . Occupation: retired  Tobacco Use  . Smoking status: Never Smoker  . Smokeless tobacco: Never Used  Vaping Use  . Vaping Use: Never used  Substance and Sexual Activity  . Alcohol use: No    Alcohol/week: 0.0 standard drinks  . Drug use:  No  . Sexual activity: Not on file  Other Topics Concern  . Not on file  Social History Narrative  . Not on file   Social Determinants of Health   Financial Resource Strain: Low Risk   . Difficulty of Paying Living Expenses: Not hard at all  Food Insecurity: No Food Insecurity  . Worried About Charity fundraiser in the Last Year: Never true  . Ran Out of Food in the Last Year: Never true  Transportation Needs: No Transportation Needs  . Lack of Transportation (Medical): No  . Lack of Transportation (Non-Medical): No  Physical Activity: Inactive  . Days of Exercise per Week: 0 days  . Minutes of Exercise per Session: 0 min  Stress: No Stress Concern Present  . Feeling of Stress : Not at all  Social Connections: Moderately Isolated  . Frequency of Communication with Friends and Family: Never  . Frequency of Social Gatherings with Friends and Family: Never  . Attends Religious Services: Never  . Active Member of Clubs or Organizations: Yes  . Attends Archivist Meetings: Never  . Marital Status: Married  Human resources officer Violence: Not At Risk  . Fear of Current or Ex-Partner: No  . Emotionally Abused: No  . Physically Abused: No  . Sexually Abused: No   Family Status  Relation Name Status  . Mother  Deceased at age 10  . Father  Deceased at age  35  . Brother 1 Deceased  . Brother 2 Alive   Family History  Problem Relation Age of Onset  . Hypertension Mother   . Hyperlipidemia Mother   . Heart attack Father   . Hypertension Father   . CVA Father   . ALS Brother   . Prostate cancer Brother    No Known Allergies  Patient Care Team: Jerrol Banana., MD as PCP - General (Family Medicine) Dingeldein, Remo Lipps, MD as Consulting Physician (Ophthalmology)   Medications: Outpatient Medications Prior to Visit  Medication Sig  . aspirin EC 81 MG tablet Take 81 mg by mouth at bedtime.  Marland Kitchen atorvastatin (LIPITOR) 20 MG tablet Take 1 tablet (20 mg total) by  mouth at bedtime.  . Cyanocobalamin (VITAMIN B-12) 500 MCG SUBL Place 1 tablet (500 mcg total) under the tongue daily.  Marland Kitchen lisinopril (ZESTRIL) 20 MG tablet Take 1 tablet (20 mg total) by mouth daily. Take 1/2 tablet daily.  . meclizine (ANTIVERT) 25 MG tablet Take 1 tablet (25 mg total) by mouth 3 (three) times daily as needed for dizziness.  . furosemide (LASIX) 20 MG tablet Take 1 tablet (20 mg total) by mouth every other day. (Patient not taking: Reported on 12/02/2019)   No facility-administered medications prior to visit.    Review of Systems     Objective    BP 136/86 (BP Location: Right Arm, Patient Position: Sitting, Cuff Size: Normal)   Pulse 64   Temp 97.6 F (36.4 C) (Oral)   Ht 6\' 2"  (1.88 m)   Wt 180 lb 12.8 oz (82 kg)   BMI 23.21 kg/m     Physical Exam Vitals reviewed.  Constitutional:      Appearance: He is well-developed.  HENT:     Head: Normocephalic and atraumatic.  Eyes:     General: No scleral icterus.    Conjunctiva/sclera: Conjunctivae normal.  Neck:     Thyroid: No thyromegaly.  Cardiovascular:     Rate and Rhythm: Normal rate and regular rhythm.     Heart sounds: Normal heart sounds.  Pulmonary:     Effort: Pulmonary effort is normal.     Breath sounds: Normal breath sounds.  Abdominal:     Palpations: Abdomen is soft.  Musculoskeletal:     Right lower leg: Edema present.     Comments: Trace left lower extremity edema and 1+ right lower extremity edema above the ankle.  Lymphadenopathy:     Cervical: No cervical adenopathy.  Skin:    General: Skin is warm and dry.  Neurological:     General: No focal deficit present.     Mental Status: He is alert and oriented to person, place, and time.     Comments: I noted right > left hand tremor at rest.   Psychiatric:        Mood and Affect: Mood normal.        Behavior: Behavior normal.        Thought Content: Thought content normal.        Judgment: Judgment normal.       Last depression  screening scores PHQ 2/9 Scores 03/01/2020 11/24/2019 11/23/2018  PHQ - 2 Score 0 0 0  PHQ- 9 Score 0 - -   Last fall risk screening Fall Risk  03/01/2020  Falls in the past year? 0  Comment -  Number falls in past yr: 0  Comment -  Injury with Fall? 0  Follow up Falls evaluation completed  Last Audit-C alcohol use screening Alcohol Use Disorder Test (AUDIT) 03/01/2020  1. How often do you have a drink containing alcohol? 0  2. How many drinks containing alcohol do you have on a typical day when you are drinking? 0  3. How often do you have six or more drinks on one occasion? 0  AUDIT-C Score 0  Alcohol Brief Interventions/Follow-up -   A score of 3 or more in women, and 4 or more in men indicates increased risk for alcohol abuse, EXCEPT if all of the points are from question 1   No results found for any visits on 03/01/20.  Assessment & Plan    Routine Health Maintenance and Physical Exam  Exercise Activities and Dietary recommendations Goals   None     Immunization History  Administered Date(s) Administered  . Fluad Quad(high Dose 65+) 03/08/2019  . Influenza, High Dose Seasonal PF 03/01/2015, 05/06/2016, 04/05/2017, 04/27/2018  . PFIZER SARS-COV-2 Vaccination 06/17/2019, 07/08/2019  . Pneumococcal Conjugate-13 06/09/2014  . Pneumococcal Polysaccharide-23 04/07/1996, 07/26/2015  . Td 02/10/2002  . Tdap 06/09/2014  . Zoster 06/30/2013    Health Maintenance  Topic Date Due  . INFLUENZA VACCINE  01/02/2020  . COLONOSCOPY  12/12/2020  . TETANUS/TDAP  06/09/2024  . COVID-19 Vaccine  Completed  . PNA vac Low Risk Adult  Completed    Discussed health benefits of physical activity, and encouraged him to engage in regular exercise appropriate for his age and condition.  1. Encounter for annual wellness visit (AWV) in Medicare patient   2. Essential hypertension Controlled on lisinopril  3. Mixed hyperlipidemia On atorvastatin 20  4. Anemia due to vitamin B12  deficiency, unspecified B12 deficiency type Follow CBC  5. Coronary artery disease involving native coronary artery of native heart without angina pectoris All risk factors treated.    Return in about 6 months (around 08/29/2020).     I, [name], MD, have reviewed all documentation for this visit. The documentation on 03/02/20 for the exam, diagnosis, procedures, and orders are all accurate and complete.    Malavika Lira Cranford Mon, MD  [facility] [phone] (phone) [phone] (fax)  [place]

## 2020-03-01 ENCOUNTER — Ambulatory Visit (INDEPENDENT_AMBULATORY_CARE_PROVIDER_SITE_OTHER): Payer: Medicare Other | Admitting: Family Medicine

## 2020-03-01 ENCOUNTER — Encounter: Payer: Self-pay | Admitting: Family Medicine

## 2020-03-01 ENCOUNTER — Other Ambulatory Visit: Payer: Self-pay

## 2020-03-01 VITALS — BP 136/86 | HR 64 | Temp 97.6°F | Ht 74.0 in | Wt 180.8 lb

## 2020-03-01 DIAGNOSIS — I10 Essential (primary) hypertension: Secondary | ICD-10-CM | POA: Diagnosis not present

## 2020-03-01 DIAGNOSIS — Z0000 Encounter for general adult medical examination without abnormal findings: Secondary | ICD-10-CM

## 2020-03-01 DIAGNOSIS — E782 Mixed hyperlipidemia: Secondary | ICD-10-CM | POA: Diagnosis not present

## 2020-03-01 DIAGNOSIS — I2510 Atherosclerotic heart disease of native coronary artery without angina pectoris: Secondary | ICD-10-CM | POA: Diagnosis not present

## 2020-03-01 DIAGNOSIS — D519 Vitamin B12 deficiency anemia, unspecified: Secondary | ICD-10-CM

## 2020-03-27 DIAGNOSIS — H6122 Impacted cerumen, left ear: Secondary | ICD-10-CM | POA: Diagnosis not present

## 2020-03-27 DIAGNOSIS — H90A21 Sensorineural hearing loss, unilateral, right ear, with restricted hearing on the contralateral side: Secondary | ICD-10-CM | POA: Diagnosis not present

## 2020-03-29 ENCOUNTER — Other Ambulatory Visit: Payer: Self-pay | Admitting: Otolaryngology

## 2020-03-29 DIAGNOSIS — H90A21 Sensorineural hearing loss, unilateral, right ear, with restricted hearing on the contralateral side: Secondary | ICD-10-CM

## 2020-04-05 ENCOUNTER — Ambulatory Visit (INDEPENDENT_AMBULATORY_CARE_PROVIDER_SITE_OTHER): Payer: Medicare Other

## 2020-04-05 ENCOUNTER — Other Ambulatory Visit: Payer: Self-pay

## 2020-04-05 DIAGNOSIS — Z23 Encounter for immunization: Secondary | ICD-10-CM

## 2020-04-07 DIAGNOSIS — I2510 Atherosclerotic heart disease of native coronary artery without angina pectoris: Secondary | ICD-10-CM | POA: Diagnosis not present

## 2020-04-07 DIAGNOSIS — R42 Dizziness and giddiness: Secondary | ICD-10-CM | POA: Diagnosis not present

## 2020-04-07 DIAGNOSIS — I38 Endocarditis, valve unspecified: Secondary | ICD-10-CM | POA: Diagnosis not present

## 2020-04-07 DIAGNOSIS — I10 Essential (primary) hypertension: Secondary | ICD-10-CM | POA: Diagnosis not present

## 2020-04-07 DIAGNOSIS — E782 Mixed hyperlipidemia: Secondary | ICD-10-CM | POA: Diagnosis not present

## 2020-04-12 ENCOUNTER — Other Ambulatory Visit: Payer: Self-pay

## 2020-04-12 ENCOUNTER — Ambulatory Visit
Admission: RE | Admit: 2020-04-12 | Discharge: 2020-04-12 | Disposition: A | Discharge status: Home / Self Care | Payer: Medicare Other | Source: Ambulatory Visit | Attending: Otolaryngology | Admitting: Otolaryngology

## 2020-04-12 DIAGNOSIS — J341 Cyst and mucocele of nose and nasal sinus: Secondary | ICD-10-CM | POA: Diagnosis not present

## 2020-04-12 DIAGNOSIS — J3489 Other specified disorders of nose and nasal sinuses: Secondary | ICD-10-CM | POA: Diagnosis not present

## 2020-04-12 DIAGNOSIS — I6782 Cerebral ischemia: Secondary | ICD-10-CM | POA: Diagnosis not present

## 2020-04-12 DIAGNOSIS — H90A21 Sensorineural hearing loss, unilateral, right ear, with restricted hearing on the contralateral side: Secondary | ICD-10-CM

## 2020-04-12 DIAGNOSIS — H9041 Sensorineural hearing loss, unilateral, right ear, with unrestricted hearing on the contralateral side: Secondary | ICD-10-CM | POA: Diagnosis not present

## 2020-04-12 MED ORDER — GADOBUTROL 1 MMOL/ML IV SOLN
8.0000 mL | Freq: Once | INTRAVENOUS | Status: AC | PRN
  Administered 2020-04-12: 8 mL via INTRAVENOUS

## 2020-04-22 ENCOUNTER — Emergency Department
Admission: EM | Admit: 2020-04-22 | Discharge: 2020-04-22 | Disposition: A | Discharge status: Home / Self Care | Payer: Medicare Other | Attending: Emergency Medicine | Admitting: Emergency Medicine

## 2020-04-22 ENCOUNTER — Other Ambulatory Visit: Payer: Self-pay

## 2020-04-22 DIAGNOSIS — I2510 Atherosclerotic heart disease of native coronary artery without angina pectoris: Secondary | ICD-10-CM | POA: Insufficient documentation

## 2020-04-22 DIAGNOSIS — I447 Left bundle-branch block, unspecified: Secondary | ICD-10-CM | POA: Diagnosis not present

## 2020-04-22 DIAGNOSIS — I10 Essential (primary) hypertension: Secondary | ICD-10-CM | POA: Diagnosis not present

## 2020-04-22 DIAGNOSIS — R1110 Vomiting, unspecified: Secondary | ICD-10-CM | POA: Insufficient documentation

## 2020-04-22 DIAGNOSIS — R1111 Vomiting without nausea: Secondary | ICD-10-CM | POA: Diagnosis not present

## 2020-04-22 DIAGNOSIS — Z79899 Other long term (current) drug therapy: Secondary | ICD-10-CM | POA: Insufficient documentation

## 2020-04-22 DIAGNOSIS — Z7982 Long term (current) use of aspirin: Secondary | ICD-10-CM | POA: Insufficient documentation

## 2020-04-22 DIAGNOSIS — R42 Dizziness and giddiness: Secondary | ICD-10-CM | POA: Insufficient documentation

## 2020-04-22 DIAGNOSIS — R531 Weakness: Secondary | ICD-10-CM | POA: Diagnosis not present

## 2020-04-22 LAB — CBC
HCT: 39.1 % (ref 39.0–52.0)
Hemoglobin: 13.6 g/dL (ref 13.0–17.0)
MCH: 33.7 pg (ref 26.0–34.0)
MCHC: 34.8 g/dL (ref 30.0–36.0)
MCV: 96.8 fL (ref 80.0–100.0)
Platelets: 172 10*3/uL (ref 150–400)
RBC: 4.04 MIL/uL — ABNORMAL LOW (ref 4.22–5.81)
RDW: 12.4 % (ref 11.5–15.5)
WBC: 7.1 10*3/uL (ref 4.0–10.5)
nRBC: 0 % (ref 0.0–0.2)

## 2020-04-22 LAB — BASIC METABOLIC PANEL
Anion gap: 10 (ref 5–15)
BUN: 16 mg/dL (ref 8–23)
CO2: 20 mmol/L — ABNORMAL LOW (ref 22–32)
Calcium: 8.7 mg/dL — ABNORMAL LOW (ref 8.9–10.3)
Chloride: 106 mmol/L (ref 98–111)
Creatinine, Ser: 1.04 mg/dL (ref 0.61–1.24)
GFR, Estimated: >60 mL/min (ref >60)
Glucose, Bld: 88 mg/dL (ref 70–99)
Potassium: 3.6 mmol/L (ref 3.5–5.1)
Sodium: 136 mmol/L (ref 135–145)

## 2020-04-22 MED ORDER — DIAZEPAM 2 MG PO TABS: 2.0000 mg | ORAL_TABLET | Freq: Three times a day (TID) | ORAL | 0 refills | Status: AC | PRN

## 2020-04-22 NOTE — ED Provider Notes (Signed)
[facility] Emergency Department Provider Note  Time seen: 4:37 PM  I have reviewed the triage vital signs and the nursing notes.   HISTORY  Chief Complaint Dizziness and Emesis   HPI Zachary [name] is a 79 y.o. male with a past medical history of hypertension, hyperlipidemia, vertigo, presents to the emergency department for dizziness.  According to the patient he has been getting frequent vertigo over the past for 5 days.  Patient states it feels like his typical vertigo but usually he is able to take meclizine and sleep and then it goes away.  He states his symptoms have been intermittent but returning each day for the past 4 days.  Patient follows up with Dr. Kathyrn Sheriff of ENT, had an MRI of the brain performed 10 days ago with overall normal findings.  Patient took meclizine earlier today for a more significant attack of vertigo, states it took several hours but currently he is feeling well he was able to get up and walk around the room during my evaluation.  Denies any focal weakness or numbness.  No headaches.  No confusion or slurred speech.  Denies any recent illnesses.  States he is feeling well currently.   Past Medical History:  Diagnosis Date  . B12 deficiency anemia 08/03/2019  . Hypercholesterolemia   . Hypertension   . Vertigo    last episode approx 09/2018    Patient Active Problem List   Diagnosis Date Noted  . Personal history of colonic polyps   . B12 deficiency anemia 08/03/2019  . Elevated MCV 08/03/2019  . Cyst, baker's knee, right 07/27/2019  . Peripheral edema- right lower leg  07/27/2019  . Encounter for screening colonoscopy 04/12/2019  . Polyp of transverse colon 04/12/2019  . Coronary artery disease involving native coronary artery of native heart without angina pectoris 04/12/2019  . Benign neoplasm of cecum 04/12/2019  . Valvular heart disease 04/12/2019  . Benign neoplasm of cecum   . Pneumothorax on right 05/15/2015  .  Benign essential tremor 02/28/2015  . Mixed hyperlipidemia 02/28/2015  . Essential hypertension 02/28/2015  . Hypercholesterolemia without hypertriglyceridemia 02/28/2015  . Vertigo 02/28/2015    Past Surgical History:  Procedure Laterality Date  . APPENDECTOMY    . CATARACT EXTRACTION Bilateral   . COLONOSCOPY WITH PROPOFOL N/A 12/24/2018   Procedure: COLONOSCOPY WITH PROPOFOL;  Surgeon: Lucilla Lame, MD;  Location: [facility] ENDOSCOPY;  Service: Endoscopy;  Laterality: N/A;  . COLONOSCOPY WITH PROPOFOL N/A 12/13/2019   Procedure: COLONOSCOPY WITH BIOPSY ;  Surgeon: Lucilla Lame, MD;  Location: [place];  Service: Endoscopy;  Laterality: N/A;  priority 3  . PLEURAL SCARIFICATION  05/2015  . POLYPECTOMY N/A 12/13/2019   Procedure: POLYPECTOMY;  Surgeon: Lucilla Lame, MD;  Location: [place];  Service: Endoscopy;  Laterality: N/A;    Prior to Admission medications   Medication Sig Start Date End Date Taking? Authorizing Provider  aspirin EC 81 MG tablet Take 81 mg by mouth at bedtime.    [provider]  atorvastatin (LIPITOR) 20 MG tablet Take 1 tablet (20 mg total) by mouth at bedtime. 11/03/19   Jerrol Banana., MD  Cyanocobalamin (VITAMIN B-12) 500 MCG SUBL Place 1 tablet (500 mcg total) under the tongue daily. 07/02/19   Flinchum, Kelby Aline, FNP  furosemide (LASIX) 20 MG tablet Take 1 tablet (20 mg total) by mouth every other day. Patient not taking: Reported on 12/02/2019 07/27/19   Flinchum, Kelby Aline, FNP  lisinopril (  ZESTRIL) 20 MG tablet Take 1 tablet (20 mg total) by mouth daily. Take 1/2 tablet daily. 11/03/19   Jerrol Banana., MD  meclizine (ANTIVERT) 25 MG tablet Take 1 tablet (25 mg total) by mouth 3 (three) times daily as needed for dizziness. 11/03/19   Jerrol Banana., MD    No Known Allergies  Family History  Problem Relation Age of Onset  . Hypertension Mother   . Hyperlipidemia Mother   . Heart attack Father   .  Hypertension Father   . CVA Father   . ALS Brother   . Prostate cancer Brother     Social History Social History   Tobacco Use  . Smoking status: Never Smoker  . Smokeless tobacco: Never Used  Vaping Use  . Vaping Use: Never used  Substance Use Topics  . Alcohol use: No    Alcohol/week: 0.0 standard drinks  . Drug use: No    Review of Systems Constitutional: Negative for fever.  Intermittent dizziness ENT: Has had some hearing loss recently, being followed up by ENT. Cardiovascular: Negative for chest pain. Respiratory: Negative for shortness of breath. Gastrointestinal: Negative for abdominal pain.  Positive for nausea vomiting during the dizziness episodes.  None currently. Genitourinary: Negative for urinary compaints Musculoskeletal: Negative for musculoskeletal complaints Neurological: Negative for headache.  Denies focal weakness or numbness. All other ROS negative  ____________________________________________   PHYSICAL EXAM:  VITAL SIGNS: ED Triage Vitals  Enc Vitals Group     BP 04/22/20 1458 (!) 169/97     Pulse Rate 04/22/20 1458 (!) 57     Resp 04/22/20 1458 18     Temp 04/22/20 1458 (!) 97.5 F (36.4 C)     Temp Source 04/22/20 1458 Oral     SpO2 04/22/20 1458 99 %     Weight 04/22/20 1459 180 lb (81.6 kg)     Height 04/22/20 1459 6' 2.5" (1.892 m)     Head Circumference --      Peak Flow --      Pain Score 04/22/20 1459 8     Pain Loc --      Pain Edu? --      Excl. in [place]? --    Constitutional: Alert and oriented. Well appearing and in no distress. Eyes: Normal exam ENT      Head: Normocephalic and atraumatic.      Mouth/Throat: Mucous membranes are moist. Cardiovascular: Normal rate, regular rhythm. Respiratory: Normal respiratory effort without tachypnea nor retractions. Breath sounds are clear  Gastrointestinal: Soft and nontender. No distention. Musculoskeletal: Nontender with normal range of motion in all extremities.  Neurologic:   Normal speech and language. No gross focal neurologic deficits.  Equal grip strength bilaterally.  Normal finger-to-nose testing. Skin:  Skin is warm, dry and intact.  Psychiatric: Mood and affect are normal.   ____________________________________________    EKG  EKG viewed and interpreted by myself shows a sinus rhythm at 60 bpm with a slightly widened QRS, normal axis, normal intervals, nonspecific ST changes.  Occasional PVC.  ____________________________________________    RADIOLOGY  Recent MRI report reviewed with no acute findings.  ____________________________________________   INITIAL IMPRESSION / ASSESSMENT AND PLAN / ED COURSE  Pertinent labs & imaging results that were available during my care of the patient were reviewed by me and considered in my medical decision making (see chart for details).   Patient presents emergency department for episodes of vertigo/dizziness that have been occurring over the past  4 days.  Patient describes the symptoms as intermittent, states they will be gone for hours or day at a time and then return.  Seem to go away mostly when he takes meclizine and then goes to sleep.  He was more concerned today because he attempted to take meclizine and go to sleep but still had vertigo when he woke up.  Patient symptoms have since resolved, was able to get up and walk around the room which he states he was not able to do so earlier due to spinning sensation.  Patient's physical as well as neurological exam are reassuring.  Lab work is reassuring EKG is reassuring.  Patient had an MRI performed 10 days ago showing no acute events.  Is currently following up with a ENT.  Patient states the meclizine does not appear to be working as well for him any longer.  I did discuss a trial of Valium with the patient.  However I discussed significant precautions with the Valium such as not drinking alcohol not driving and that it can make you feel very off balance and lead  to falls.  Patient states he would only take it for severe episode and would go straight to bed which I believe is reasonable.  Patient will follow up with ENT.  Given the intermittent symptoms and symptoms appear to be motion provoked as well as intact finger-to-nose/cerebellar testing I believe his symptoms are very consistent with BPPV and not consistent with cerebellar CVA.  Patient appears to have a significant vertigo history as well as a recent negative MRI.  ZACKERIAH KISSLER was evaluated in Emergency Department on 04/22/2020 for the symptoms described in the history of present illness. He was evaluated in the context of the global COVID-19 pandemic, which necessitated consideration that the patient might be at risk for infection with the SARS-CoV-2 virus that causes COVID-19. Institutional protocols and algorithms that pertain to the evaluation of patients at risk for COVID-19 are in a state of rapid change based on information released by regulatory bodies including the CDC and federal and state organizations. These policies and algorithms were followed during the patient's care in the ED.  ____________________________________________   FINAL CLINICAL IMPRESSION(S) / ED DIAGNOSES  Vertigo   Harvest Dark, MD 04/22/20 1645

## 2020-04-22 NOTE — ED Triage Notes (Addendum)
Pt arrived via POV with reports of vertigo for the past week, pt states he has hx of vertigo and usually comes and goes but states he has continued to have vertigo. PT also reports vomiting today.  Takes meclizine but has not been helping.   Pt also reports he has recent MRI 2 weeks ago.

## 2020-04-22 NOTE — ED Notes (Signed)
To ED with EMS. Complaints of vertigo. Has 18g left forearm. Was given Zofran 4 mg IV. If he opens his eyes he "will vomit". Negative stroke screen with EMS. VS WNL

## 2020-05-01 ENCOUNTER — Other Ambulatory Visit: Payer: Self-pay | Admitting: Family Medicine

## 2020-05-01 DIAGNOSIS — E782 Mixed hyperlipidemia: Secondary | ICD-10-CM

## 2020-05-04 ENCOUNTER — Other Ambulatory Visit: Payer: Self-pay | Admitting: Family Medicine

## 2020-05-04 DIAGNOSIS — R42 Dizziness and giddiness: Secondary | ICD-10-CM

## 2020-05-04 NOTE — Telephone Encounter (Signed)
Requested medications are due for refill today yes  Requested medications are on the active medication list yes  Last refill 6/2  Last visit 11/2019  Future visit scheduled 08/2020  Notes to clinic Not Delegated

## 2020-05-15 DIAGNOSIS — H8109 Meniere's disease, unspecified ear: Secondary | ICD-10-CM | POA: Diagnosis not present

## 2020-05-15 DIAGNOSIS — H90A21 Sensorineural hearing loss, unilateral, right ear, with restricted hearing on the contralateral side: Secondary | ICD-10-CM | POA: Diagnosis not present

## 2020-07-04 DIAGNOSIS — H90A31 Mixed conductive and sensorineural hearing loss, unilateral, right ear with restricted hearing on the contralateral side: Secondary | ICD-10-CM | POA: Diagnosis not present

## 2020-07-04 DIAGNOSIS — H903 Sensorineural hearing loss, bilateral: Secondary | ICD-10-CM | POA: Diagnosis not present

## 2020-07-04 DIAGNOSIS — H8109 Meniere's disease, unspecified ear: Secondary | ICD-10-CM | POA: Diagnosis not present

## 2020-08-04 ENCOUNTER — Other Ambulatory Visit: Payer: Self-pay | Admitting: Family Medicine

## 2020-08-04 DIAGNOSIS — E782 Mixed hyperlipidemia: Secondary | ICD-10-CM

## 2020-08-04 MED ORDER — ATORVASTATIN CALCIUM 20 MG PO TABS: 20.0000 mg | ORAL_TABLET | Freq: Every day | ORAL | 0 refills | Status: DC

## 2020-08-04 NOTE — Telephone Encounter (Signed)
Medication Refill - Medication: atorvastatin (LIPITOR) 20 MG tablet   Has the patient contacted their pharmacy? Yes.    (Agent: If yes, when and what did the pharmacy advise?) Contact PCP due to no refills  Preferred Pharmacy (with phone number or street name):  [id number] - [place], [place] Phone:  [phone]  Fax:  [phone]       Agent: Please be advised that RX refills may take up to 3 business days. We ask that you follow-up with your pharmacy.

## 2020-08-04 NOTE — Telephone Encounter (Signed)
Future visit in 3 weeks °

## 2020-08-08 DIAGNOSIS — H33001 Unspecified retinal detachment with retinal break, right eye: Secondary | ICD-10-CM | POA: Diagnosis not present

## 2020-08-09 DIAGNOSIS — H9191 Unspecified hearing loss, right ear: Secondary | ICD-10-CM | POA: Diagnosis not present

## 2020-08-09 DIAGNOSIS — H9311 Tinnitus, right ear: Secondary | ICD-10-CM | POA: Diagnosis not present

## 2020-08-14 ENCOUNTER — Other Ambulatory Visit: Payer: Self-pay | Admitting: Family Medicine

## 2020-08-14 DIAGNOSIS — I10 Essential (primary) hypertension: Secondary | ICD-10-CM

## 2020-08-14 MED ORDER — LISINOPRIL 20 MG PO TABS: 20.0000 mg | ORAL_TABLET | Freq: Every day | ORAL | 0 refills | Status: DC

## 2020-08-14 NOTE — Telephone Encounter (Signed)
Courtesy refill. Future visit in 2 weeks

## 2020-08-14 NOTE — Telephone Encounter (Signed)
Medication Refill - Medication: Lisinopril   Has the patient contacted their pharmacy? Yes.   Pt states that because this is a new pharmacy they needed him to contact PCP. Please advise. (Agent: If no, request that the patient contact the pharmacy for the refill.) (Agent: If yes, when and what did the pharmacy advise?)  Preferred Pharmacy (with phone number or street name):  [id number] - [place], [place]  [place] [address]  Phone: [phone] Fax: [phone]  Hours: Not open 24 hours     Agent: Please be advised that RX refills may take up to 3 business days. We ask that you follow-up with your pharmacy.

## 2020-08-15 ENCOUNTER — Other Ambulatory Visit: Payer: Self-pay | Admitting: *Deleted

## 2020-08-15 DIAGNOSIS — I10 Essential (primary) hypertension: Secondary | ICD-10-CM

## 2020-08-15 MED ORDER — LISINOPRIL 20 MG PO TABS: 20.0000 mg | ORAL_TABLET | Freq: Every day | ORAL | 0 refills | Status: DC

## 2020-08-21 DIAGNOSIS — H6123 Impacted cerumen, bilateral: Secondary | ICD-10-CM | POA: Diagnosis not present

## 2020-08-21 DIAGNOSIS — H90A31 Mixed conductive and sensorineural hearing loss, unilateral, right ear with restricted hearing on the contralateral side: Secondary | ICD-10-CM | POA: Diagnosis not present

## 2020-08-21 DIAGNOSIS — H8101 Meniere's disease, right ear: Secondary | ICD-10-CM | POA: Diagnosis not present

## 2020-08-21 DIAGNOSIS — H903 Sensorineural hearing loss, bilateral: Secondary | ICD-10-CM | POA: Diagnosis not present

## 2020-08-29 ENCOUNTER — Ambulatory Visit (INDEPENDENT_AMBULATORY_CARE_PROVIDER_SITE_OTHER): Payer: Medicare Other | Admitting: Family Medicine

## 2020-08-29 ENCOUNTER — Encounter: Payer: Self-pay | Admitting: Family Medicine

## 2020-08-29 ENCOUNTER — Other Ambulatory Visit: Payer: Self-pay

## 2020-08-29 VITALS — BP 136/71 | HR 56 | Temp 97.7°F | Resp 16 | Ht 74.0 in | Wt 187.0 lb

## 2020-08-29 DIAGNOSIS — I2510 Atherosclerotic heart disease of native coronary artery without angina pectoris: Secondary | ICD-10-CM

## 2020-08-29 DIAGNOSIS — H8109 Meniere's disease, unspecified ear: Secondary | ICD-10-CM

## 2020-08-29 DIAGNOSIS — I10 Essential (primary) hypertension: Secondary | ICD-10-CM | POA: Diagnosis not present

## 2020-08-29 DIAGNOSIS — G250 Essential tremor: Secondary | ICD-10-CM | POA: Diagnosis not present

## 2020-08-29 DIAGNOSIS — E7800 Pure hypercholesterolemia, unspecified: Secondary | ICD-10-CM

## 2020-08-29 NOTE — Progress Notes (Signed)
Established patient visit   Patient: Zachary [name]   DOB: 06/15/1940   80 y.o. Male  MRN: [id number] Visit Date: 08/29/2020  Today's healthcare provider: [name], MD   Chief Complaint  Patient presents with  . Hypertension  . Hyperlipidemia   Subjective    HPI  Patient feels well overall.  He has no complaints. He is now on betahistine for Mnire's disease.  He is doing well with this. Hypertension, follow-up  BP Readings from Last 3 Encounters:  08/29/20 136/71  04/22/20 131/79  03/01/20 136/86   Wt Readings from Last 3 Encounters:  08/29/20 187 lb (84.8 kg)  04/22/20 180 lb (81.6 kg)  03/01/20 180 lb 12.8 oz (82 kg)     He was last seen for hypertension 6 months ago.  BP at that visit was 136/86. Management since that visit includes; Controlled on lisinopril. He reports good compliance with treatment. He is not having side effects.  He is not exercising. He is adherent to low salt diet.   Outside blood pressures are not being checked.  He does not smoke.  Use of agents associated with hypertension: none.   Lipid/Cholesterol, Follow-up  Last lipid panel Other pertinent labs  Lab Results  Component Value Date   CHOL 180 11/04/2019   HDL 76 11/04/2019   LDLCALC 92 11/04/2019   TRIG 63 11/04/2019   CHOLHDL 2.6 11/04/2018   Lab Results  Component Value Date   ALT 10 11/04/2019   AST 19 11/04/2019   PLT 172 04/22/2020   TSH 4.240 11/04/2019     He was last seen for this 6 months ago.  Management since that visit includes no medication changes.  He reports good compliance with treatment. He is not having side effects.   Symptoms: No chest pain No chest pressure/discomfort  No dyspnea No lower extremity edema  No numbness or tingling of extremity No orthopnea  No palpitations No paroxysmal nocturnal dyspnea  No speech difficulty No syncope   Current diet: well balanced Current exercise: walking  The 10-year ASCVD risk  score Mikey Bussing DC Jr., et al., 2013) is: 30.5%       Medications: Outpatient Medications Prior to Visit  Medication Sig  . aspirin EC 81 MG tablet Take 81 mg by mouth at bedtime.  Marland Kitchen atorvastatin (LIPITOR) 20 MG tablet Take 1 tablet (20 mg total) by mouth at bedtime.  . Cyanocobalamin (VITAMIN B-12) 500 MCG SUBL Place 1 tablet (500 mcg total) under the tongue daily.  Marland Kitchen lisinopril (ZESTRIL) 20 MG tablet Take 1 tablet (20 mg total) by mouth daily.  . meclizine (ANTIVERT) 25 MG tablet TAKE 1 TABLET BY MOUTH THREE TIMES DAILY AS NEEDED FOR DIZZINESS  . furosemide (LASIX) 20 MG tablet Take 1 tablet (20 mg total) by mouth every other day. (Patient not taking: No sig reported)   No facility-administered medications prior to visit.    Review of Systems  Constitutional: Negative for appetite change, chills and fever.  Respiratory: Negative for chest tightness, shortness of breath and wheezing.   Cardiovascular: Negative for chest pain and palpitations.  Gastrointestinal: Negative for abdominal pain, nausea and vomiting.        Objective    BP 136/71   Pulse (!) 56   Temp 97.7 F (36.5 C)   Resp 16   Ht 6\' 2"  (1.88 m)   Wt 187 lb (84.8 kg)   BMI 24.01 kg/m  BP Readings from Last 3 Encounters:  08/29/20 136/71  04/22/20 131/79  03/01/20 136/86   Wt Readings from Last 3 Encounters:  08/29/20 187 lb (84.8 kg)  04/22/20 180 lb (81.6 kg)  03/01/20 180 lb 12.8 oz (82 kg)       Physical Exam Vitals reviewed.  Constitutional:      Appearance: He is well-developed.  HENT:     Head: Normocephalic and atraumatic.  Eyes:     General: No scleral icterus.    Conjunctiva/sclera: Conjunctivae normal.  Neck:     Thyroid: No thyromegaly.  Cardiovascular:     Rate and Rhythm: Normal rate and regular rhythm.     Heart sounds: Normal heart sounds.  Pulmonary:     Effort: Pulmonary effort is normal.     Breath sounds: Normal breath sounds.  Abdominal:     Palpations: Abdomen is soft.   Musculoskeletal:     Comments: Trace left lower extremity edema and 1+ right lower extremity edema above the ankle.  Lymphadenopathy:     Cervical: No cervical adenopathy.  Skin:    General: Skin is warm and dry.  Neurological:     General: No focal deficit present.     Mental Status: He is alert and oriented to person, place, and time.     Comments: I noted right > left hand tremor at rest.   Psychiatric:        Mood and Affect: Mood normal.        Behavior: Behavior normal.        Thought Content: Thought content normal.        Judgment: Judgment normal.       No results found for any visits on 08/29/20.  Assessment & Plan     1. Coronary artery disease involving native coronary artery of native heart without angina pectoris All risk factors treated.  2. Essential hypertension Good control.  3. Benign essential tremor Clinically stable.  4. Hypercholesterolemia without hypertriglyceridemia On statin.  5. Meniere's disease, unspecified laterality On betahistine.   No follow-ups on file.      I, [name], MD, have reviewed all documentation for this visit. The documentation on 09/10/20 for the exam, diagnosis, procedures, and orders are all accurate and complete.    Emerson Barretto Cranford Mon, MD  [facility] [phone] (phone) [phone] (fax)  [place]

## 2020-10-25 ENCOUNTER — Other Ambulatory Visit: Payer: Self-pay | Admitting: Family Medicine

## 2020-10-25 DIAGNOSIS — E782 Mixed hyperlipidemia: Secondary | ICD-10-CM

## 2020-11-09 ENCOUNTER — Other Ambulatory Visit: Payer: Self-pay | Admitting: Family Medicine

## 2020-11-09 DIAGNOSIS — I10 Essential (primary) hypertension: Secondary | ICD-10-CM

## 2021-02-07 ENCOUNTER — Other Ambulatory Visit: Payer: Self-pay | Admitting: Family Medicine

## 2021-02-07 DIAGNOSIS — I10 Essential (primary) hypertension: Secondary | ICD-10-CM

## 2021-02-08 NOTE — Telephone Encounter (Signed)
Requested Prescriptions  Pending Prescriptions Disp Refills  . lisinopril (ZESTRIL) 20 MG tablet [Pharmacy Med Name: LISINOPRIL 20 MG TABLET] 90 tablet 0    Sig: TAKE 1 TABLET BY MOUTH EVERY DAY     Cardiovascular:  ACE Inhibitors Failed - 02/07/2021  8:23 PM      Failed - Cr in normal range and within 180 days    Creat  Date Value Ref Range Status  05/22/2017 1.14 0.70 - 1.18 mg/dL Final    Comment:    For patients >78 years of age, the reference limit for Creatinine is approximately 13% higher for people identified as African-American. .    Creatinine, Ser  Date Value Ref Range Status  04/22/2020 1.04 0.61 - 1.24 mg/dL Final         Failed - K in normal range and within 180 days    Potassium  Date Value Ref Range Status  04/22/2020 3.6 3.5 - 5.1 mmol/L Final         Passed - Patient is not pregnant      Passed - Last BP in normal range    BP Readings from Last 1 Encounters:  08/29/20 136/71         Passed - Valid encounter within last 6 months    Recent Outpatient Visits          5 months ago Coronary artery disease involving native coronary artery of native heart without angina pectoris   [facility] Jerrol Banana., MD   11 months ago Encounter for annual wellness visit (AWV) in Medicare patient   [facility] Jerrol Banana., MD   12 months ago Hemoptysis   [facility] Jerrol Banana., MD   1 year ago Essential hypertension   [facility] Jerrol Banana., MD   1 year ago Peripheral edema- right lower leg    [facility] Jerrol Banana., MD      Future Appointments            In 1 week Jerrol Banana., MD [facility], [place]

## 2021-02-12 DIAGNOSIS — Z872 Personal history of diseases of the skin and subcutaneous tissue: Secondary | ICD-10-CM | POA: Diagnosis not present

## 2021-02-12 DIAGNOSIS — L570 Actinic keratosis: Secondary | ICD-10-CM | POA: Diagnosis not present

## 2021-02-12 DIAGNOSIS — Z85828 Personal history of other malignant neoplasm of skin: Secondary | ICD-10-CM | POA: Diagnosis not present

## 2021-02-12 DIAGNOSIS — L578 Other skin changes due to chronic exposure to nonionizing radiation: Secondary | ICD-10-CM | POA: Diagnosis not present

## 2021-02-12 DIAGNOSIS — D485 Neoplasm of uncertain behavior of skin: Secondary | ICD-10-CM | POA: Diagnosis not present

## 2021-02-12 DIAGNOSIS — Z86018 Personal history of other benign neoplasm: Secondary | ICD-10-CM | POA: Diagnosis not present

## 2021-02-21 ENCOUNTER — Ambulatory Visit: Payer: PRIVATE HEALTH INSURANCE | Admitting: Family Medicine

## 2021-02-21 NOTE — Progress Notes (Deleted)
Established patient visit   Patient: Zachary [name]   DOB: [birth year]   80 y.o. Male  MRN: [id number] Visit Date: 02/21/2021  Today's healthcare provider: [name], MD   No chief complaint on file.  Subjective    HPI  Hypertension, follow-up  BP Readings from Last 3 Encounters:  08/29/20 136/71  04/22/20 131/79  03/01/20 136/86   Wt Readings from Last 3 Encounters:  08/29/20 187 lb (84.8 kg)  04/22/20 180 lb (81.6 kg)  03/01/20 180 lb 12.8 oz (82 kg)     He was last seen for hypertension 6 months ago.  BP at that visit was 136/71. Management since that visit includes; Good control. He reports {excellent/good/fair/poor:19665} compliance with treatment. He {is/is not:9024} having side effects. {document side effects if present:1} He {is/is not:9024} exercising. He {is/is not:9024} adherent to low salt diet.   Outside blood pressures are {enter patient reported home BP, or 'not being checked':1}.  He {does/does not:200015} smoke.  Use of agents associated with hypertension: {bp agents assoc with hypertension:511::"none"}.   --------------------------------------------------------------------------------------------------- Lipid/Cholesterol, follow-up  Last Lipid Panel: Lab Results  Component Value Date   CHOL 180 11/04/2019   LDLCALC 92 11/04/2019   HDL 76 11/04/2019   TRIG 63 11/04/2019    He was last seen for this 11/04/2019.  Management since that visit includes; on atorvastatin.  He reports {excellent/good/fair/poor:19665} compliance with treatment. He {is/is not:9024} having side effects. {document side effects if present:1}  He is following a {diet:21022986} diet. Current exercise: {exercise ELFYB:01751}  Last metabolic panel Lab Results  Component Value Date   GLUCOSE 88 04/22/2020   NA 136 04/22/2020   K 3.6 04/22/2020   BUN 16 04/22/2020   CREATININE 1.04 04/22/2020   GFRNONAA >60 04/22/2020   GFRAA 62 11/04/2019    CALCIUM 8.7 (L) 04/22/2020   AST 19 11/04/2019   ALT 10 11/04/2019   The 10-year ASCVD risk score (Arnett DK, et al., 2019) is: 32.6%  ---------------------------------------------------------------------------------------------------   {Link to patient history deactivated due to formatting error:1}  Medications: Outpatient Medications Prior to Visit  Medication Sig   aspirin EC 81 MG tablet Take 81 mg by mouth at bedtime.   atorvastatin (LIPITOR) 20 MG tablet TAKE 1 TABLET BY MOUTH EVERYDAY AT BEDTIME   Cyanocobalamin (VITAMIN B-12) 500 MCG SUBL Place 1 tablet (500 mcg total) under the tongue daily.   furosemide (LASIX) 20 MG tablet Take 1 tablet (20 mg total) by mouth every other day. (Patient not taking: No sig reported)   lisinopril (ZESTRIL) 20 MG tablet TAKE 1 TABLET BY MOUTH EVERY DAY   meclizine (ANTIVERT) 25 MG tablet TAKE 1 TABLET BY MOUTH THREE TIMES DAILY AS NEEDED FOR DIZZINESS   No facility-administered medications prior to visit.    Review of Systems  Constitutional:  Negative for appetite change, chills and fever.  Respiratory:  Negative for chest tightness, shortness of breath and wheezing.   Cardiovascular:  Negative for chest pain and palpitations.  Gastrointestinal:  Negative for abdominal pain, nausea and vomiting.   {Labs  Heme  Chem  Endocrine  Serology  Results Review (optional):23779}   Objective    There were no vitals taken for this visit. {Show previous vital signs (optional):23777}  Physical Exam  ***  No results found for any visits on 02/21/21.  Assessment & Plan     ***  No follow-ups on file.      {provider attestation***:1}   [name], MD  [place] [phone] (phone) [phone] (fax)  [place]

## 2021-02-27 DIAGNOSIS — H33001 Unspecified retinal detachment with retinal break, right eye: Secondary | ICD-10-CM | POA: Diagnosis not present

## 2021-03-14 ENCOUNTER — Ambulatory Visit (INDEPENDENT_AMBULATORY_CARE_PROVIDER_SITE_OTHER): Payer: Medicare Other | Admitting: Family Medicine

## 2021-03-14 ENCOUNTER — Other Ambulatory Visit: Payer: Self-pay

## 2021-03-14 ENCOUNTER — Encounter: Payer: Self-pay | Admitting: Family Medicine

## 2021-03-14 VITALS — BP 160/90 | HR 65 | Temp 98.2°F | Resp 16 | Ht 74.0 in | Wt 172.0 lb

## 2021-03-14 DIAGNOSIS — I2510 Atherosclerotic heart disease of native coronary artery without angina pectoris: Secondary | ICD-10-CM | POA: Diagnosis not present

## 2021-03-14 DIAGNOSIS — H8109 Meniere's disease, unspecified ear: Secondary | ICD-10-CM | POA: Diagnosis not present

## 2021-03-14 DIAGNOSIS — Z23 Encounter for immunization: Secondary | ICD-10-CM

## 2021-03-14 DIAGNOSIS — E782 Mixed hyperlipidemia: Secondary | ICD-10-CM

## 2021-03-14 DIAGNOSIS — I10 Essential (primary) hypertension: Secondary | ICD-10-CM

## 2021-03-14 MED ORDER — TRIAMTERENE-HCTZ 37.5-25 MG PO CAPS: 1.0000 | ORAL_CAPSULE | Freq: Every day | ORAL | 3 refills | Status: DC

## 2021-03-14 NOTE — Progress Notes (Signed)
I,[name],acting as a scribe for [name], MD.,have documented all relevant documentation on the behalf of [name], MD,as directed by  [name], MD while in the presence of [name], MD.   Established patient visit   Patient: Zachary [name]   DOB: 04/20/1941   80 y.o. Male  MRN: [id number] Visit Date: 03/14/2021  Today's healthcare provider: [name], MD   Chief Complaint  Patient presents with   Follow-up   Hypertension   Hyperlipidemia   Subjective    HPI  Patient comes in today with evaded blood pressure and occasional dizziness.  Some tenderness noted.  No syncope or presyncope. Hypertension, follow-up  BP Readings from Last 3 Encounters:  03/14/21 (!) 160/90  08/29/20 136/71  04/22/20 131/79   Wt Readings from Last 3 Encounters:  03/14/21 172 lb (78 kg)  08/29/20 187 lb (84.8 kg)  04/22/20 180 lb (81.6 kg)     He was last seen for hypertension 7 months ago.  BP at that visit was 136/71. Management since that visit includes; Good control. He reports good compliance with treatment. He is not having side effects.   Outside blood pressures are not checking.  He does not smoke.  Use of agents associated with hypertension: none.   --------------------------------------------------------------------------------------------------- Lipid/Cholesterol, follow-up  Last Lipid Panel: Lab Results  Component Value Date   CHOL 180 11/04/2019   LDLCALC 92 11/04/2019   HDL 76 11/04/2019   TRIG 63 11/04/2019    He was last seen for this 7 months ago.  Management since that visit includes; on atorvastatin. Labs ordered but zero done.  He reports good compliance with treatment. He is not having side effects. none  He is following a Regular diet.   Last metabolic panel Lab Results  Component Value Date   GLUCOSE 88 04/22/2020   NA 136 04/22/2020   K 3.6 04/22/2020   BUN 16 04/22/2020   CREATININE  1.04 04/22/2020   GFRNONAA >60 04/22/2020   CALCIUM 8.7 (L) 04/22/2020   AST 19 11/04/2019   ALT 10 11/04/2019   The 10-year ASCVD risk score (Arnett DK, et al., 2019) is: 41%  ---------------------------------------------------------------------------------------------------  Medications: Outpatient Medications Prior to Visit  Medication Sig   aspirin EC 81 MG tablet Take 81 mg by mouth at bedtime.   atorvastatin (LIPITOR) 20 MG tablet TAKE 1 TABLET BY MOUTH EVERYDAY AT BEDTIME   lisinopril (ZESTRIL) 20 MG tablet TAKE 1 TABLET BY MOUTH EVERY DAY   PRESCRIPTION MEDICATION betahistine   [DISCONTINUED] Cyanocobalamin (VITAMIN B-12) 500 MCG SUBL Place 1 tablet (500 mcg total) under the tongue daily.   [DISCONTINUED] furosemide (LASIX) 20 MG tablet Take 1 tablet (20 mg total) by mouth every other day. (Patient not taking: No sig reported)   [DISCONTINUED] meclizine (ANTIVERT) 25 MG tablet TAKE 1 TABLET BY MOUTH THREE TIMES DAILY AS NEEDED FOR DIZZINESS (Patient not taking: Reported on 03/14/2021)   No facility-administered medications prior to visit.    Review of Systems  Respiratory:  Negative for shortness of breath and wheezing.   Cardiovascular:  Negative for chest pain, palpitations and leg swelling.  Gastrointestinal:  Negative for nausea and vomiting.  Neurological:  Negative for dizziness and headaches.       Objective    BP (!) 160/90 (BP Location: Left Arm, Patient Position: Sitting, Cuff Size: Normal)   Pulse 65   Temp 98.2 F (36.8 C) (Temporal)   Resp 16  Ht 6\' 2"  (1.88 m)   Wt 172 lb (78 kg)   SpO2 97%   BMI 22.08 kg/m  BP Readings from Last 3 Encounters:  03/14/21 (!) 160/90  08/29/20 136/71  04/22/20 131/79   Wt Readings from Last 3 Encounters:  03/14/21 172 lb (78 kg)  08/29/20 187 lb (84.8 kg)  04/22/20 180 lb (81.6 kg)      Physical Exam Vitals reviewed.  Constitutional:      General: He is not in acute distress.    Appearance: He is  well-developed.  HENT:     Head: Normocephalic and atraumatic.     Right Ear: Hearing normal.     Left Ear: Hearing normal.     Nose: Nose normal.  Eyes:     General: Lids are normal. No scleral icterus.       Right eye: No discharge.        Left eye: No discharge.     Conjunctiva/sclera: Conjunctivae normal.  Cardiovascular:     Rate and Rhythm: Normal rate and regular rhythm.     Heart sounds: Normal heart sounds.  Pulmonary:     Effort: Pulmonary effort is normal. No respiratory distress.  Skin:    Findings: No lesion or rash.  Neurological:     General: No focal deficit present.     Mental Status: He is alert and oriented to person, place, and time.  Psychiatric:        Mood and Affect: Mood normal.        Speech: Speech normal.        Behavior: Behavior normal.        Thought Content: Thought content normal.        Judgment: Judgment normal.      No results found for any visits on 03/14/21.  Assessment & Plan     1. Essential hypertension Recheck blood pressure is 153/86.  Try Dyazide every morning. Also follow-up weight loss on next visit - CBC w/Diff/Platelet - Comprehensive Metabolic Panel (CMET) - Lipid panel - TSH  2. Need for immunization against influenza  - Flu Vaccine QUAD High Dose(Fluad) - CBC w/Diff/Platelet - Comprehensive Metabolic Panel (CMET) - Lipid panel - TSH  3. Meniere's disease, unspecified laterality Patient has diagnosis of Mnire's disease from ENT. - triamterene-hydrochlorothiazide (DYAZIDE) 37.5-25 MG capsule; Take 1 each (1 capsule total) by mouth daily.  Dispense: 30 capsule; Refill: 3 - CBC w/Diff/Platelet - Comprehensive Metabolic Panel (CMET) - Lipid panel - TSH  4. Coronary artery disease involving native coronary artery of native heart without angina pectoris Risk.prov  factors treated - CBC w/Diff/Platelet - Comprehensive Metabolic Panel (CMET) - Lipid panel - TSH  5. Mixed hyperlipidemia  - CBC  w/Diff/Platelet - Comprehensive Metabolic Panel (CMET) - Lipid panel - TSH   Return in about 4 months (around 07/15/2021).      I, [name], MD, have reviewed all documentation for this visit. The documentation on 03/24/21 for the exam, diagnosis, procedures, and orders are all accurate and complete.    Joshua Soulier Cranford Mon, MD  [facility] [phone] (phone) [phone] (fax)  [place]

## 2021-03-15 ENCOUNTER — Ambulatory Visit: Payer: Self-pay | Admitting: *Deleted

## 2021-03-15 DIAGNOSIS — Z23 Encounter for immunization: Secondary | ICD-10-CM | POA: Diagnosis not present

## 2021-03-15 DIAGNOSIS — I10 Essential (primary) hypertension: Secondary | ICD-10-CM | POA: Diagnosis not present

## 2021-03-15 DIAGNOSIS — E782 Mixed hyperlipidemia: Secondary | ICD-10-CM | POA: Diagnosis not present

## 2021-03-15 DIAGNOSIS — H8109 Meniere's disease, unspecified ear: Secondary | ICD-10-CM | POA: Diagnosis not present

## 2021-03-15 DIAGNOSIS — I2510 Atherosclerotic heart disease of native coronary artery without angina pectoris: Secondary | ICD-10-CM | POA: Diagnosis not present

## 2021-03-15 NOTE — Telephone Encounter (Signed)
Summary: Call back request, Med question   triamterene-hydrochlorothiazide (DYAZIDE) 37.5-25 MG capsule   Pt called and is requesting a call back to clarify his Rx directions. He wants to take it at night because the medicine warns against being out in the sun. Pt's wife will most likely answer the phone.     Spoke with pt's wife, on [place]. States pt would like to take med at night as directions read "Stay out of sun and may cause dizziness."  Advised if he takes at night, may keep him awake urinating. States pt asked pharmacist and was told same thing. States "He worries and wants to hear from [facility]ell if taking at night is OK." Assured NT would route to practice for PCPs review. States if call back, leave message. Reason for Disposition  [1] Caller has NON-URGENT medicine question about med that PCP prescribed AND [2] triager unable to answer question  Answer Assessment - Initial Assessment Questions 1. NAME of MEDICATION: "What medicine are you calling about?"     HCTZ 2. QUESTION: "What is your question?" (e.g., double dose of medicine, side effect)     "Can I take at night." 3. PRESCRIBING HCP: "Who prescribed it?" Reason: if prescribed by specialist, call should be referred to that group.     *No Answer* 4. SYMPTOMS: "Do you have any symptoms?"     *No Answer* 5. SEVERITY: If symptoms are present, ask "Are they mild, moderate or severe?"     *No Answer* 6. PREGNANCY:  "Is there any chance that you are pregnant?" "When was your last menstrual period?"     *No Answer*  Protocols used: Medication Question Call-A-AH

## 2021-03-16 DIAGNOSIS — H90A31 Mixed conductive and sensorineural hearing loss, unilateral, right ear with restricted hearing on the contralateral side: Secondary | ICD-10-CM | POA: Diagnosis not present

## 2021-03-16 DIAGNOSIS — H8109 Meniere's disease, unspecified ear: Secondary | ICD-10-CM | POA: Diagnosis not present

## 2021-03-16 LAB — CBC WITH DIFFERENTIAL/PLATELET
Basophils Absolute: 0 10*3/uL (ref 0.0–0.2)
Basos: 1 %
EOS (ABSOLUTE): 0.1 10*3/uL (ref 0.0–0.4)
Eos: 3 %
Hematocrit: 39.4 % (ref 37.5–51.0)
Hemoglobin: 13.3 g/dL (ref 13.0–17.7)
Immature Grans (Abs): 0 10*3/uL (ref 0.0–0.1)
Immature Granulocytes: 1 %
Lymphocytes Absolute: 0.7 10*3/uL (ref 0.7–3.1)
Lymphs: 19 %
MCH: 32.6 pg (ref 26.6–33.0)
MCHC: 33.8 g/dL (ref 31.5–35.7)
MCV: 97 fL (ref 79–97)
Monocytes Absolute: 0.3 10*3/uL (ref 0.1–0.9)
Monocytes: 8 %
Neutrophils Absolute: 2.7 10*3/uL (ref 1.4–7.0)
Neutrophils: 68 %
Platelets: 200 10*3/uL (ref 150–450)
RBC: 4.08 x10E6/uL — ABNORMAL LOW (ref 4.14–5.80)
RDW: 11.9 % (ref 11.6–15.4)
WBC: 3.9 10*3/uL (ref 3.4–10.8)

## 2021-03-16 LAB — COMPREHENSIVE METABOLIC PANEL
ALT: 11 IU/L (ref 0–44)
AST: 21 IU/L (ref 0–40)
Albumin/Globulin Ratio: 1.9 (ref 1.2–2.2)
Albumin: 4 g/dL (ref 3.7–4.7)
Alkaline Phosphatase: 74 IU/L (ref 44–121)
BUN/Creatinine Ratio: 8 — ABNORMAL LOW (ref 10–24)
BUN: 10 mg/dL (ref 8–27)
Bilirubin Total: 0.9 mg/dL (ref 0.0–1.2)
CO2: 23 mmol/L (ref 20–29)
Calcium: 9.3 mg/dL (ref 8.6–10.2)
Chloride: 106 mmol/L (ref 96–106)
Creatinine, Ser: 1.31 mg/dL — ABNORMAL HIGH (ref 0.76–1.27)
Globulin, Total: 2.1 g/dL (ref 1.5–4.5)
Glucose: 85 mg/dL (ref 70–99)
Potassium: 4.3 mmol/L (ref 3.5–5.2)
Sodium: 140 mmol/L (ref 134–144)
Total Protein: 6.1 g/dL (ref 6.0–8.5)
eGFR: 56 mL/min/{1.73_m2} — ABNORMAL LOW (ref >59)

## 2021-03-16 LAB — LIPID PANEL
Chol/HDL Ratio: 2.1 ratio (ref 0.0–5.0)
Cholesterol, Total: 171 mg/dL (ref 100–199)
HDL: 83 mg/dL (ref >39)
LDL Chol Calc (NIH): 75 mg/dL (ref 0–99)
Triglycerides: 69 mg/dL (ref 0–149)
VLDL Cholesterol Cal: 13 mg/dL (ref 5–40)

## 2021-03-16 LAB — TSH: TSH: 5.27 u[IU]/mL — ABNORMAL HIGH (ref 0.450–4.500)

## 2021-03-19 NOTE — Telephone Encounter (Signed)
Just stop taking the new diuretic.  It was for his dizziness, not blood pressure

## 2021-03-19 NOTE — Telephone Encounter (Signed)
Pt. Given message from PCP. Verbalizes understanding.

## 2021-03-22 ENCOUNTER — Telehealth: Payer: Self-pay | Admitting: *Deleted

## 2021-03-22 NOTE — Telephone Encounter (Signed)
Patient returned call- Advised lab results. Labs stable. Please advise patient.

## 2021-03-30 DIAGNOSIS — I2510 Atherosclerotic heart disease of native coronary artery without angina pectoris: Secondary | ICD-10-CM | POA: Diagnosis not present

## 2021-03-30 DIAGNOSIS — E782 Mixed hyperlipidemia: Secondary | ICD-10-CM | POA: Diagnosis not present

## 2021-03-30 DIAGNOSIS — I10 Essential (primary) hypertension: Secondary | ICD-10-CM | POA: Diagnosis not present

## 2021-03-30 DIAGNOSIS — I38 Endocarditis, valve unspecified: Secondary | ICD-10-CM | POA: Diagnosis not present

## 2021-05-07 ENCOUNTER — Other Ambulatory Visit: Payer: Self-pay | Admitting: Family Medicine

## 2021-05-07 DIAGNOSIS — I10 Essential (primary) hypertension: Secondary | ICD-10-CM

## 2021-06-10 ENCOUNTER — Other Ambulatory Visit: Payer: Self-pay | Admitting: Family Medicine

## 2021-06-10 DIAGNOSIS — H8109 Meniere's disease, unspecified ear: Secondary | ICD-10-CM

## 2021-06-10 NOTE — Telephone Encounter (Signed)
Requested Prescriptions  Pending Prescriptions Disp Refills   triamterene-hydrochlorothiazide (DYAZIDE) 37.5-25 MG capsule [Pharmacy Med Name: TRIAMTERENE-HCTZ 37.5-25 MG CP] 90 capsule 1    Sig: TAKE 1 CAPSULE BY MOUTH EVERY DAY     Cardiovascular: Diuretic Combos Failed - 06/10/2021  9:40 AM      Failed - Cr in normal range and within 360 days    Creat  Date Value Ref Range Status  05/22/2017 1.14 0.70 - 1.18 mg/dL Final    Comment:    For patients >53 years of age, the reference limit for Creatinine is approximately 13% higher for people identified as African-American. .    Creatinine, Ser  Date Value Ref Range Status  03/15/2021 1.31 (H) 0.76 - 1.27 mg/dL Final         Failed - Last BP in normal range    BP Readings from Last 1 Encounters:  03/14/21 (!) 160/90         Passed - K in normal range and within 360 days    Potassium  Date Value Ref Range Status  03/15/2021 4.3 3.5 - 5.2 mmol/L Final         Passed - Na in normal range and within 360 days    Sodium  Date Value Ref Range Status  03/15/2021 140 134 - 144 mmol/L Final         Passed - Ca in normal range and within 360 days    Calcium  Date Value Ref Range Status  03/15/2021 9.3 8.6 - 10.2 mg/dL Final         Passed - Valid encounter within last 6 months    Recent Outpatient Visits          2 months ago Essential hypertension   [facility] Jerrol Banana., MD   9 months ago Coronary artery disease involving native coronary artery of native heart without angina pectoris   [facility] Jerrol Banana., MD   1 year ago Encounter for annual wellness visit (AWV) in Medicare patient   [facility] Jerrol Banana., MD   1 year ago Hemoptysis   [facility] Jerrol Banana., MD   1 year ago Essential hypertension   [facility] Jerrol Banana., MD      Future Appointments            In 1 month  Jerrol Banana., MD [facility], [place]

## 2021-06-19 ENCOUNTER — Ambulatory Visit: Payer: Self-pay

## 2021-06-19 NOTE — Telephone Encounter (Signed)
Pt called, states he hasn't been taking the Dyazide medication and wasn't sure why it was sent to pharmacy. Advised pt OV from 03/2021 Dr. Rosanna Randy mentioned him taking it for BP and Meniere's disease. Pt states he takes lisinopril for BP and another med for vertigo. Informed pt he has appt coming up in 07/2021 to disregard and can talk to Dr Rosanna Randy at visit about med since he hasn't been taking this. Pt agreed and felt this way best.   Summary: question about medication   Pt requests call back for an explanation why Rx for triamterene-hydrochlorothiazide (DYAZIDE) 37.5-25 MG capsule was sent to his pharmacy. Pt stated he has not been in for an appt in a few months so he is not sure why the Rx was sent. Cb# [phone]      Reason for Disposition  Caller has medicine question only, adult not sick, AND triager answers question  Answer Assessment - Initial Assessment Questions 1. NAME of MEDICATION: "What medicine are you calling about?"     dyazide 2. QUESTION: "What is your question?" (e.g., double dose of medicine, side effect)     Haven't been taking this medication 3. PRESCRIBING HCP: "Who prescribed it?" Reason: if prescribed by specialist, call should be referred to that group.     Dr Rosanna Randy  Protocols used: Medication Question Call-A-AH

## 2021-07-13 NOTE — Progress Notes (Signed)
Established patient visit  I,[name],acting as a scribe for [name], MD.,have documented all relevant documentation on the behalf of [name], MD,as directed by  [name], MD while in the presence of [name], MD.   Patient: Zachary [name]   DOB: 02-14-1941   80 y.o. Male  MRN: [id number] Visit Date: 07/16/2021  Today's healthcare provider: [name], MD   Chief Complaint  Patient presents with   Follow-up   Hypertension   Subjective    HPI  Patient comes in today for follow-up.  He feels fairly well.  Headaches and vertigo are a little bit better.  Tinnitus remains.  He has lost 18 pounds in the past year but only 3 in the past 6 months.  Overall he feels fairly well but wants to go through a weight loss evaluation. Hypertension, follow-up  BP Readings from Last 3 Encounters:  07/16/21 133/70  03/14/21 (!) 160/90  08/29/20 136/71   Wt Readings from Last 3 Encounters:  07/16/21 169 lb (76.7 kg)  03/14/21 172 lb (78 kg)  08/29/20 187 lb (84.8 kg)     He was last seen for hypertension 4 months ago.  BP at that visit was 160/90.  Management since that visit includes; Recheck blood pressure is 153/86.  Try Dyazide every morning. Also follow-up weight loss on next visit.  He reports good compliance with treatment. He is not having side effects. none He is following a Regular diet. He is exercising. He does not smoke.  Use of agents associated with hypertension: none.   Outside blood pressures are none.  Pertinent labs: Lab Results  Component Value Date   CHOL 171 03/15/2021   HDL 83 03/15/2021   LDLCALC 75 03/15/2021   TRIG 69 03/15/2021   CHOLHDL 2.1 03/15/2021   Lab Results  Component Value Date   NA 140 03/15/2021   K 4.3 03/15/2021   CREATININE 1.31 (H) 03/15/2021   EGFR 56 (L) 03/15/2021   GLUCOSE 85 03/15/2021   TSH 5.270 (H) 03/15/2021     The 10-year ASCVD risk score (Arnett DK, et  al., 2019) is: 30.6%   ---------------------------------------------------------------------------------------------------   Medications: Outpatient Medications Prior to Visit  Medication Sig   aspirin EC 81 MG tablet Take 81 mg by mouth at bedtime.   atorvastatin (LIPITOR) 20 MG tablet TAKE 1 TABLET BY MOUTH EVERYDAY AT BEDTIME   lisinopril (ZESTRIL) 20 MG tablet TAKE 1 TABLET BY MOUTH EVERY DAY   PRESCRIPTION MEDICATION betahistine   triamterene-hydrochlorothiazide (DYAZIDE) 37.5-25 MG capsule TAKE 1 CAPSULE BY MOUTH EVERY DAY   No facility-administered medications prior to visit.    Review of Systems  Constitutional:  Negative for appetite change, chills and fever.  Respiratory:  Negative for chest tightness, shortness of breath and wheezing.   Cardiovascular:  Negative for chest pain and palpitations.  Gastrointestinal:  Negative for abdominal pain, nausea and vomiting.   Last lipids Lab Results  Component Value Date   CHOL 171 03/15/2021   HDL 83 03/15/2021   LDLCALC 75 03/15/2021   TRIG 69 03/15/2021   CHOLHDL 2.1 03/15/2021       Objective    BP 133/70 (BP Location: Right Arm, Patient Position: Sitting, Cuff Size: Normal)    Pulse 70    Temp 98 F (36.7 C) (Temporal)    Resp 14    Ht 6' 2" (1.88 m)    Wt 169 lb (76.7 kg)  SpO2 98%    BMI 21.70 kg/m  BP Readings from Last 3 Encounters:  07/16/21 133/70  03/14/21 (!) 160/90  08/29/20 136/71   Wt Readings from Last 3 Encounters:  07/16/21 169 lb (76.7 kg)  03/14/21 172 lb (78 kg)  08/29/20 187 lb (84.8 kg)      Physical Exam Vitals reviewed.  Constitutional:      General: He is not in acute distress.    Appearance: He is well-developed.  HENT:     Head: Normocephalic and atraumatic.     Right Ear: Hearing normal.     Left Ear: Hearing normal.     Nose: Nose normal.  Eyes:     General: Lids are normal. No scleral icterus.       Right eye: No discharge.        Left eye: No discharge.      Conjunctiva/sclera: Conjunctivae normal.  Cardiovascular:     Rate and Rhythm: Normal rate and regular rhythm.     Heart sounds: Normal heart sounds.  Pulmonary:     Effort: Pulmonary effort is normal. No respiratory distress.     Breath sounds: Normal breath sounds.  Musculoskeletal:     Right lower leg: No edema.     Left lower leg: No edema.  Skin:    General: Skin is warm and dry.     Findings: No lesion or rash.  Neurological:     General: No focal deficit present.     Mental Status: He is alert and oriented to person, place, and time.  Psychiatric:        Mood and Affect: Mood normal.        Speech: Speech normal.        Behavior: Behavior normal.        Thought Content: Thought content normal.        Judgment: Judgment normal.      No results found for any visits on 07/16/21.  Assessment & Plan     1. Essential hypertension Excellent control - CBC w/Diff/Platelet - Comprehensive Metabolic Panel (CMET) - TSH  2. Weight loss Lost 18 pounds in the year.  Feels fairly well but a year older.  We will start to pursue CT of the chest and abdomen to further work-up. - CBC w/Diff/Platelet - Comprehensive Metabolic Panel (CMET) - TSH  3. Renal insufficiency Serum protein electrophoresis - CBC w/Diff/Platelet - Comprehensive Metabolic Panel (CMET) - TSH - Protein Electrophoresis, (serum)  4. Generalized headaches Proved - CBC w/Diff/Platelet - Comprehensive Metabolic Panel (CMET) - TSH - Sed Rate (ESR)  5. Vertigo Improving - CBC w/Diff/Platelet - Comprehensive Metabolic Panel (CMET) - TSH  6. Tinnitus Stable - CBC w/Diff/Platelet - Comprehensive Metabolic Panel (CMET) - TSH   No follow-ups on file.      I, [name], MD, have reviewed all documentation for this visit. The documentation on 07/20/21 for the exam, diagnosis, procedures, and orders are all accurate and complete.    Breck Hollinger Cranford Mon, MD  [facility] [phone] (phone) [phone] (fax)  [place]

## 2021-07-16 ENCOUNTER — Ambulatory Visit (INDEPENDENT_AMBULATORY_CARE_PROVIDER_SITE_OTHER): Payer: Medicare Other | Admitting: Family Medicine

## 2021-07-16 ENCOUNTER — Other Ambulatory Visit: Payer: Self-pay

## 2021-07-16 ENCOUNTER — Encounter: Payer: Self-pay | Admitting: Family Medicine

## 2021-07-16 VITALS — BP 133/70 | HR 70 | Temp 98.0°F | Resp 14 | Ht 74.0 in | Wt 169.0 lb

## 2021-07-16 DIAGNOSIS — D499 Neoplasm of unspecified behavior of unspecified site: Secondary | ICD-10-CM | POA: Diagnosis not present

## 2021-07-16 DIAGNOSIS — N289 Disorder of kidney and ureter, unspecified: Secondary | ICD-10-CM

## 2021-07-16 DIAGNOSIS — R519 Headache, unspecified: Secondary | ICD-10-CM | POA: Diagnosis not present

## 2021-07-16 DIAGNOSIS — I10 Essential (primary) hypertension: Secondary | ICD-10-CM

## 2021-07-16 DIAGNOSIS — R42 Dizziness and giddiness: Secondary | ICD-10-CM | POA: Diagnosis not present

## 2021-07-16 DIAGNOSIS — R634 Abnormal weight loss: Secondary | ICD-10-CM | POA: Diagnosis not present

## 2021-07-16 NOTE — Patient Instructions (Signed)
GET SHINGLES VACCINE AT PHARMACY.

## 2021-07-19 LAB — PROTEIN ELECTROPHORESIS, SERUM
A/G Ratio: 1.4 (ref 0.7–1.7)
Albumin ELP: 3.7 g/dL (ref 2.9–4.4)
Alpha 1: 0.2 g/dL (ref 0.0–0.4)
Alpha 2: 0.7 g/dL (ref 0.4–1.0)
Beta: 0.8 g/dL (ref 0.7–1.3)
Gamma Globulin: 1 g/dL (ref 0.4–1.8)
Globulin, Total: 2.7 g/dL (ref 2.2–3.9)

## 2021-07-19 LAB — CBC WITH DIFFERENTIAL/PLATELET
Basophils Absolute: 0 10*3/uL (ref 0.0–0.2)
Basos: 1 %
EOS (ABSOLUTE): 0.1 10*3/uL (ref 0.0–0.4)
Eos: 2 %
Hematocrit: 39.1 % (ref 37.5–51.0)
Hemoglobin: 13.5 g/dL (ref 13.0–17.7)
Immature Grans (Abs): 0 10*3/uL (ref 0.0–0.1)
Immature Granulocytes: 1 %
Lymphocytes Absolute: 0.6 10*3/uL — ABNORMAL LOW (ref 0.7–3.1)
Lymphs: 11 %
MCH: 33 pg (ref 26.6–33.0)
MCHC: 34.5 g/dL (ref 31.5–35.7)
MCV: 96 fL (ref 79–97)
Monocytes Absolute: 0.5 10*3/uL (ref 0.1–0.9)
Monocytes: 8 %
Neutrophils Absolute: 4.4 10*3/uL (ref 1.4–7.0)
Neutrophils: 77 %
Platelets: 249 10*3/uL (ref 150–450)
RBC: 4.09 x10E6/uL — ABNORMAL LOW (ref 4.14–5.80)
RDW: 11.6 % (ref 11.6–15.4)
WBC: 5.7 10*3/uL (ref 3.4–10.8)

## 2021-07-19 LAB — COMPREHENSIVE METABOLIC PANEL
ALT: 12 IU/L (ref 0–44)
AST: 21 IU/L (ref 0–40)
Albumin/Globulin Ratio: 1.8 (ref 1.2–2.2)
Albumin: 4.1 g/dL (ref 3.7–4.7)
Alkaline Phosphatase: 85 IU/L (ref 44–121)
BUN/Creatinine Ratio: 10 (ref 10–24)
BUN: 12 mg/dL (ref 8–27)
Bilirubin Total: 0.5 mg/dL (ref 0.0–1.2)
CO2: 21 mmol/L (ref 20–29)
Calcium: 9.2 mg/dL (ref 8.6–10.2)
Chloride: 104 mmol/L (ref 96–106)
Creatinine, Ser: 1.23 mg/dL (ref 0.76–1.27)
Globulin, Total: 2.3 g/dL (ref 1.5–4.5)
Glucose: 112 mg/dL — ABNORMAL HIGH (ref 70–99)
Potassium: 4.3 mmol/L (ref 3.5–5.2)
Sodium: 140 mmol/L (ref 134–144)
Total Protein: 6.4 g/dL (ref 6.0–8.5)
eGFR: 60 mL/min/{1.73_m2} (ref >59)

## 2021-07-19 LAB — TSH: TSH: 3.35 u[IU]/mL (ref 0.450–4.500)

## 2021-07-19 LAB — SEDIMENTATION RATE: Sed Rate: 7 mm/hr (ref 0–30)

## 2021-07-26 ENCOUNTER — Telehealth: Payer: Self-pay

## 2021-07-26 NOTE — Telephone Encounter (Signed)
Copied from [place] [id number]. Topic: General - Other >> Jul 26, 2021 11:28 AM Bayard Beaver wrote: Reason for CRM:pt called in about getting a ct scan done He sys he thinks he reclled Dr Rosanna Randy mentioning this, 2 weeks ago at his appt

## 2021-08-06 ENCOUNTER — Other Ambulatory Visit: Payer: Self-pay | Admitting: Family Medicine

## 2021-08-06 DIAGNOSIS — I10 Essential (primary) hypertension: Secondary | ICD-10-CM

## 2021-08-08 NOTE — Telephone Encounter (Signed)
Left message on vm advising as below.  ?

## 2021-08-08 NOTE — Telephone Encounter (Signed)
LMOVM for pt to return call 

## 2021-08-09 NOTE — Telephone Encounter (Signed)
Patient called and given message below from Dr. Rosanna Randy, patient verbalized understanding. ? ?Jerrol Banana., MD  Rosanna Randy Nurse 2 days ago  ? ?Insurance/Medicare would not cover the CT scans.  See me in May for follow-up.     ? ?

## 2021-08-09 NOTE — Telephone Encounter (Signed)
Pt patient called back, requesting to speak with a nurse.  ? ?Pt requesting a call back.  ? ? ? ?

## 2021-08-09 NOTE — Telephone Encounter (Signed)
Tried calling patient. Left message to call back. Okay for [facility] triage to advise.  ?

## 2021-08-29 DIAGNOSIS — H33001 Unspecified retinal detachment with retinal break, right eye: Secondary | ICD-10-CM | POA: Diagnosis not present

## 2021-09-18 DIAGNOSIS — H90A31 Mixed conductive and sensorineural hearing loss, unilateral, right ear with restricted hearing on the contralateral side: Secondary | ICD-10-CM | POA: Diagnosis not present

## 2021-09-18 DIAGNOSIS — H90A21 Sensorineural hearing loss, unilateral, right ear, with restricted hearing on the contralateral side: Secondary | ICD-10-CM | POA: Diagnosis not present

## 2021-09-18 DIAGNOSIS — H8109 Meniere's disease, unspecified ear: Secondary | ICD-10-CM | POA: Diagnosis not present

## 2021-09-21 ENCOUNTER — Telehealth: Payer: Self-pay | Admitting: Family Medicine

## 2021-09-21 NOTE — Telephone Encounter (Signed)
Copied from [place] [id number]. Topic: Medicare AWV ?>> Sep 21, 2021  9:44 AM Cher Nakai R wrote: ?Reason for CRM:  ?Left message for patient to call back and schedule Medicare Annual Wellness Visit (AWV) in office.  ? ?If unable to come into the office for AWV,  please offer to do virtually or by telephone. ? ?Last AWV: 11/24/2019 ? ?Please schedule at anytime with [facility] Health Advisor. ? ?30 minute appointment for Virtual or phone ?45 minute appointment for in office or Initial virtual/phone ? ?Any questions, please contact me at [phone] ?

## 2021-09-25 ENCOUNTER — Ambulatory Visit (INDEPENDENT_AMBULATORY_CARE_PROVIDER_SITE_OTHER): Payer: Medicare Other

## 2021-09-25 VITALS — BP 122/80 | HR 80 | Temp 97.9°F | Ht 74.0 in | Wt 169.0 lb

## 2021-09-25 DIAGNOSIS — Z0000 Encounter for general adult medical examination without abnormal findings: Secondary | ICD-10-CM | POA: Diagnosis not present

## 2021-09-25 NOTE — Patient Instructions (Signed)
Mr. Zellars , ?Thank you for taking time to come for your Medicare Wellness Visit. I appreciate your ongoing commitment to your health goals. Please review the following plan we discussed and let me know if I can assist you in the future.  ? ?Screening recommendations/referrals: ?Colonoscopy: 12/13/19 ?Recommended yearly ophthalmology/optometry visit for glaucoma screening and checkup ?Recommended yearly dental visit for hygiene and checkup ? ?Vaccinations: ?Influenza vaccine: 03/14/21 ?Pneumococcal vaccine: 07/26/15 ?Tdap vaccine: 06/09/14 ?Shingles vaccine: Zostavax 06/30/13   Shingrix 09/15/21  needs second shot ?Covid-19: 06/17/19, 07/08/19 ? ?Advanced directives: no ? ?Conditions/risks identified: none ? ?Next appointment: Follow up in one year for your annual wellness visit. 09/30/22 @ 11am in person ? ?Preventive Care 36 Years and Older, Male ?Preventive care refers to lifestyle choices and visits with your health care provider that can promote health and wellness. ?What does preventive care include? ?A yearly physical exam. This is also called an annual well check. ?Dental exams once or twice a year. ?Routine eye exams. Ask your health care provider how often you should have your eyes checked. ?Personal lifestyle choices, including: ?Daily care of your teeth and gums. ?Regular physical activity. ?Eating a healthy diet. ?Avoiding tobacco and drug use. ?Limiting alcohol use. ?Practicing safe sex. ?Taking low doses of aspirin every day. ?Taking vitamin and mineral supplements as recommended by your health care provider. ?What happens during an annual well check? ?The services and screenings done by your health care provider during your annual well check will depend on your age, overall health, lifestyle risk factors, and family history of disease. ?Counseling  ?Your health care provider may ask you questions about your: ?Alcohol use. ?Tobacco use. ?Drug use. ?Emotional well-being. ?Home and relationship  well-being. ?Sexual activity. ?Eating habits. ?History of falls. ?Memory and ability to understand (cognition). ?Work and work Statistician. ?Screening  ?You may have the following tests or measurements: ?Height, weight, and BMI. ?Blood pressure. ?Lipid and cholesterol levels. These may be checked every 5 years, or more frequently if you are over 62 years old. ?Skin check. ?Lung cancer screening. You may have this screening every year starting at age 71 if you have a 30-pack-year history of smoking and currently smoke or have quit within the past 15 years. ?Fecal occult blood test (FOBT) of the stool. You may have this test every year starting at age 68. ?Flexible sigmoidoscopy or colonoscopy. You may have a sigmoidoscopy every 5 years or a colonoscopy every 10 years starting at age 40. ?Prostate cancer screening. Recommendations will vary depending on your family history and other risks. ?Hepatitis C blood test. ?Hepatitis B blood test. ?Sexually transmitted disease (STD) testing. ?Diabetes screening. This is done by checking your blood sugar (glucose) after you have not eaten for a while (fasting). You may have this done every 1-3 years. ?Abdominal aortic aneurysm (AAA) screening. You may need this if you are a current or former smoker. ?Osteoporosis. You may be screened starting at age 61 if you are at high risk. ?Talk with your health care provider about your test results, treatment options, and if necessary, the need for more tests. ?Vaccines  ?Your health care provider may recommend certain vaccines, such as: ?Influenza vaccine. This is recommended every year. ?Tetanus, diphtheria, and acellular pertussis (Tdap, Td) vaccine. You may need a Td booster every 10 years. ?Zoster vaccine. You may need this after age 98. ?Pneumococcal 13-valent conjugate (PCV13) vaccine. One dose is recommended after age 23. ?Pneumococcal polysaccharide (PPSV23) vaccine. One dose is recommended after age 50. ?  Talk to your health care  provider about which screenings and vaccines you need and how often you need them. ?This information is not intended to replace advice given to you by your health care provider. Make sure you discuss any questions you have with your health care provider. ?Document Released: 06/16/2015 Document Revised: 02/07/2016 Document Reviewed: 03/21/2015 ?Elsevier Interactive Patient Education ? 2017 [employer]. ? ?Fall Prevention in the Home ?Falls can cause injuries. They can happen to people of all ages. There are many things you can do to make your home safe and to help prevent falls. ?What can I do on the outside of my home? ?Regularly fix the edges of walkways and driveways and fix any cracks. ?Remove anything that might make you trip as you walk through a door, such as a raised step or threshold. ?Trim any bushes or trees on the path to your home. ?Use bright outdoor lighting. ?Clear any walking paths of anything that might make someone trip, such as rocks or tools. ?Regularly check to see if handrails are loose or broken. Make sure that both sides of any steps have handrails. ?Any raised decks and porches should have guardrails on the edges. ?Have any leaves, snow, or ice cleared regularly. ?Use sand or salt on walking paths during winter. ?Clean up any spills in your garage right away. This includes oil or grease spills. ?What can I do in the bathroom? ?Use night lights. ?Install grab bars by the toilet and in the tub and shower. Do not use towel bars as grab bars. ?Use non-skid mats or decals in the tub or shower. ?If you need to sit down in the shower, use a plastic, non-slip stool. ?Keep the floor dry. Clean up any water that spills on the floor as soon as it happens. ?Remove soap buildup in the tub or shower regularly. ?Attach bath mats securely with double-sided non-slip rug tape. ?Do not have throw rugs and other things on the floor that can make you trip. ?What can I do in the bedroom? ?Use night lights. ?Make  sure that you have a light by your bed that is easy to reach. ?Do not use any sheets or blankets that are too big for your bed. They should not hang down onto the floor. ?Have a firm chair that has side arms. You can use this for support while you get dressed. ?Do not have throw rugs and other things on the floor that can make you trip. ?What can I do in the kitchen? ?Clean up any spills right away. ?Avoid walking on wet floors. ?Keep items that you use a lot in easy-to-reach places. ?If you need to reach something above you, use a strong step stool that has a grab bar. ?Keep electrical cords out of the way. ?Do not use floor polish or wax that makes floors slippery. If you must use wax, use non-skid floor wax. ?Do not have throw rugs and other things on the floor that can make you trip. ?What can I do with my stairs? ?Do not leave any items on the stairs. ?Make sure that there are handrails on both sides of the stairs and use them. Fix handrails that are broken or loose. Make sure that handrails are as long as the stairways. ?Check any carpeting to make sure that it is firmly attached to the stairs. Fix any carpet that is loose or worn. ?Avoid having throw rugs at the top or bottom of the stairs. If you do have throw  rugs, attach them to the floor with carpet tape. ?Make sure that you have a light switch at the top of the stairs and the bottom of the stairs. If you do not have them, ask someone to add them for you. ?What else can I do to help prevent falls? ?Wear shoes that: ?Do not have high heels. ?Have rubber bottoms. ?Are comfortable and fit you well. ?Are closed at the toe. Do not wear sandals. ?If you use a stepladder: ?Make sure that it is fully opened. Do not climb a closed stepladder. ?Make sure that both sides of the stepladder are locked into place. ?Ask someone to hold it for you, if possible. ?Clearly mark and make sure that you can see: ?Any grab bars or handrails. ?First and last steps. ?Where the  edge of each step is. ?Use tools that help you move around (mobility aids) if they are needed. These include: ?Canes. ?Walkers. ?Scooters. ?Crutches. ?Turn on the lights when you go into a dark area. Replace any ligh

## 2021-09-25 NOTE — Progress Notes (Signed)
Subjective:   Zachary [name] is a 81 y.o. male who presents for Medicare Annual/Subsequent preventive examination.  Review of Systems           Objective:    Today's Vitals   09/25/21 1129  BP: 122/80  Pulse: 80  Temp: 97.9 F (36.6 C)  TempSrc: Oral  SpO2: 98%  Weight: 169 lb (76.7 kg)  Height: '6\' 2"'$  (1.88 m)   Body mass index is 21.7 kg/m.     12/13/2019    7:41 AM 11/24/2019   10:21 AM 06/21/2019   10:46 AM 12/24/2018    8:33 AM 11/23/2018   10:07 AM 08/02/2018   12:19 PM 11/19/2017   10:44 AM  Advanced Directives  Does Patient Have a Medical Advance Directive? Yes No No No No No Yes  Type of Paramedic of [place];Living will      [place];Living will  Does patient want to make changes to medical advance directive?  No - Patient declined       Copy of [place] in Chart? No - copy requested      No - copy requested  Would patient like information on creating a medical advance directive?    No - Patient declined No - Patient declined      Current Medications (verified) Outpatient Encounter Medications as of 09/25/2021  Medication Sig   aspirin EC 81 MG tablet Take 81 mg by mouth at bedtime.   atorvastatin (LIPITOR) 20 MG tablet TAKE 1 TABLET BY MOUTH EVERYDAY AT BEDTIME   lisinopril (ZESTRIL) 20 MG tablet TAKE 1 TABLET BY MOUTH EVERY DAY   PRESCRIPTION MEDICATION betahistine   triamterene-hydrochlorothiazide (DYAZIDE) 37.5-25 MG capsule TAKE 1 CAPSULE BY MOUTH EVERY DAY   No facility-administered encounter medications on file as of 09/25/2021.    Allergies (verified) Patient has no known allergies.   History: Past Medical History:  Diagnosis Date   B12 deficiency anemia 08/03/2019   Hypercholesterolemia    Hypertension    Vertigo    last episode approx 09/2018   Past Surgical History:  Procedure Laterality Date   APPENDECTOMY     CATARACT EXTRACTION Bilateral    COLONOSCOPY WITH PROPOFOL  N/A 12/24/2018   Procedure: COLONOSCOPY WITH PROPOFOL;  Surgeon: Lucilla Lame, MD;  Location: [facility] ENDOSCOPY;  Service: Endoscopy;  Laterality: N/A;   COLONOSCOPY WITH PROPOFOL N/A 12/13/2019   Procedure: COLONOSCOPY WITH BIOPSY ;  Surgeon: Lucilla Lame, MD;  Location: [place];  Service: Endoscopy;  Laterality: N/A;  priority 3   PLEURAL SCARIFICATION  05/2015   POLYPECTOMY N/A 12/13/2019   Procedure: POLYPECTOMY;  Surgeon: Lucilla Lame, MD;  Location: [place];  Service: Endoscopy;  Laterality: N/A;   Family History  Problem Relation Age of Onset   Hypertension Mother    Hyperlipidemia Mother    Heart attack Father    Hypertension Father    CVA Father    ALS Brother    Prostate cancer Brother    Social History   Socioeconomic History   Marital status: Married    Spouse name: Not on file   Number of children: 0   Years of education: Not on file   Highest education level: 12th grade  Occupational History   Occupation: retired  Tobacco Use   Smoking status: Never   Smokeless tobacco: Never  Vaping Use   Vaping Use: Never used  Substance and Sexual Activity   Alcohol use: No  Alcohol/week: 0.0 standard drinks   Drug use: No   Sexual activity: Not on file  Other Topics Concern   Not on file  Social History Narrative   Not on file   Social Determinants of Health   Financial Resource Strain: Not on file  Food Insecurity: Not on file  Transportation Needs: Not on file  Physical Activity: Not on file  Stress: Not on file  Social Connections: Not on file    Tobacco Counseling Counseling given: Not Answered   Clinical Intake:  Pre-visit preparation completed: Yes  Pain : No/denies pain     Nutritional Risks: None Diabetes: No  How often do you need to have someone help you when you read instructions, pamphlets, or other written materials from your doctor or pharmacy?: 1 - Never  Diabetic?no  Interpreter Needed?: No  Information  entered by :: Kirke Shaggy, LPN   Activities of Daily Living     View : No data to display.          Patient Care Team: Jerrol Banana., MD as PCP - General (Family Medicine) Dingeldein, Remo Lipps, MD as Consulting Physician (Ophthalmology)  Indicate any recent Medical Services you may have received from other than [facility] providers in the past year (date may be approximate).     Assessment:   This is a routine wellness examination for [place].  Hearing/Vision screen No results found.  Dietary issues and exercise activities discussed:     Goals Addressed   None    Depression Screen    03/01/2020   10:33 AM 11/24/2019   10:17 AM 11/23/2018   10:08 AM 04/27/2018    8:38 AM 11/19/2017   10:45 AM 11/12/2016    9:20 AM 11/12/2016    9:15 AM  PHQ 2/9 Scores  PHQ - 2 Score 0 0 0 0 0 0 0  PHQ- 9 Score 0     0 0    Fall Risk    03/01/2020   10:32 AM 11/24/2019   10:22 AM 11/23/2018   10:08 AM 04/27/2018    8:38 AM 11/19/2017   10:45 AM  Fall Risk   Falls in the past year? 0 0 0 0 No  Number falls in past yr: 0 0     Injury with Fall? 0 0     Follow up Falls evaluation completed        FALL RISK PREVENTION PERTAINING TO THE HOME:  Any stairs in or around the home? No  If so, are there any without handrails? No  Home free of loose throw rugs in walkways, pet beds, electrical cords, etc? Yes  Adequate lighting in your home to reduce risk of falls? Yes   ASSISTIVE DEVICES UTILIZED TO PREVENT FALLS:  Life alert? No  Use of a cane, walker or w/c? No  Grab bars in the bathroom? Yes  Shower chair or bench in shower? Yes  Elevated toilet seat or a handicapped toilet? Yes   TIMED UP AND GO:  Was the test performed? Yes .  Length of time to ambulate 10 feet: 4 sec.   Gait steady and fast without use of assistive device  Cognitive Function:        11/12/2016    9:23 AM  6CIT Screen  What Year? 0 points  What month? 0 points  What time? 0 points  Count  back from 20 0 points  Months in reverse 0 points  Repeat phrase 0 points  Total Score  0 points    Immunizations Immunization History  Administered Date(s) Administered   Fluad Quad(high Dose 65+) 03/08/2019, 04/05/2020, 03/14/2021   Influenza, High Dose Seasonal PF 03/01/2015, 05/06/2016, 04/05/2017, 04/27/2018   PFIZER(Purple Top)SARS-COV-2 Vaccination 06/17/2019, 07/08/2019   Pneumococcal Conjugate-13 06/09/2014   Pneumococcal Polysaccharide-23 04/07/1996, 07/26/2015   Td 02/10/2002   Tdap 06/09/2014   Zoster, Live 06/30/2013    TDAP status: Up to date  Flu Vaccine status: Up to date  Pneumococcal vaccine status: Up to date  Covid-19 vaccine status: Completed vaccines  Qualifies for Shingles Vaccine? Yes   Zostavax completed Yes   Shingrix Completed?: No.    Education has been provided regarding the importance of this vaccine. Patient has been advised to call insurance company to determine out of pocket expense if they have not yet received this vaccine. Advised may also receive vaccine at local pharmacy or Health Dept. Verbalized acceptance and understanding.  Screening Tests Health Maintenance  Topic Date Due   Zoster Vaccines- Shingrix (1 of 2) Never done   COVID-19 Vaccine (3 - Booster for Pfizer series) 09/02/2019   COLONOSCOPY (Pts 45-86yr Insurance coverage will need to be confirmed)  12/12/2020   INFLUENZA VACCINE  01/01/2022   TETANUS/TDAP  06/09/2024   Pneumonia Vaccine 81 Years old  Completed   HPV VACCINES  Aged Out    Health Maintenance  Health Maintenance Due  Topic Date Due   Zoster Vaccines- Shingrix (1 of 2) Never done   COVID-19 Vaccine (3 - Booster for [place]) 09/02/2019   COLONOSCOPY (Pts 45-430yrInsurance coverage will need to be confirmed)  12/12/2020    Colorectal cancer screening: Type of screening: Colonoscopy. Completed 12/13/19. Repeat every 5 years  Lung Cancer Screening: (Low Dose CT Chest recommended if Age 79-80ears,  30 pack-year currently smoking OR have quit w/in 15years.) does not qualify.    Additional Screening:  Hepatitis C Screening: does not qualify; Completed no  Vision Screening: Recommended annual ophthalmology exams for early detection of glaucoma and other disorders of the eye. Is the patient up to date with their annual eye exam?  Yes  Who is the provider or what is the name of the office in which the patient attends annual eye exams? [facility] provider, would they like to be referred to a provider to establish care? No .   Dental Screening: Recommended annual dental exams for proper oral hygiene  Community Resource Referral / Chronic Care Management: CRR required this visit?  No   CCM required this visit?  No      Plan:     I have personally reviewed and noted the following in the patient's chart:   Medical and social history Use of alcohol, tobacco or illicit drugs  Current medications and supplements including opioid prescriptions. Patient is not currently taking opioid prescriptions. Functional ability and status Nutritional status Physical activity Advanced directives List of other physicians Hospitalizations, surgeries, and ER visits in previous 12 months Vitals Screenings to include cognitive, depression, and falls Referrals and appointments  In addition, I have reviewed and discussed with patient certain preventive protocols, quality metrics, and best practice recommendations. A written personalized care plan for preventive services as well as general preventive health recommendations were provided to patient.     LoDionisio DavidLPN   09/09/10/7517 Nurse Notes: none

## 2021-10-08 ENCOUNTER — Ambulatory Visit (INDEPENDENT_AMBULATORY_CARE_PROVIDER_SITE_OTHER): Payer: Medicare Other | Admitting: Family Medicine

## 2021-10-08 ENCOUNTER — Ambulatory Visit: Payer: Medicare Other | Admitting: Family Medicine

## 2021-10-08 VITALS — BP 122/74 | HR 59 | Temp 98.3°F | Wt 167.0 lb

## 2021-10-08 DIAGNOSIS — E7800 Pure hypercholesterolemia, unspecified: Secondary | ICD-10-CM

## 2021-10-08 DIAGNOSIS — I10 Essential (primary) hypertension: Secondary | ICD-10-CM | POA: Diagnosis not present

## 2021-10-08 DIAGNOSIS — I2510 Atherosclerotic heart disease of native coronary artery without angina pectoris: Secondary | ICD-10-CM | POA: Diagnosis not present

## 2021-10-08 DIAGNOSIS — R609 Edema, unspecified: Secondary | ICD-10-CM | POA: Diagnosis not present

## 2021-10-08 DIAGNOSIS — G250 Essential tremor: Secondary | ICD-10-CM

## 2021-10-08 NOTE — Progress Notes (Signed)
?  ? ? ?Established patient visit ? ? ?Patient: Zachary [name]   DOB: Oct 22, 1940   81 y.o. Male  MRN: [id number] ?Visit Date: 10/08/2021 ? ?Today's healthcare provider: [name], MD  ? ?No chief complaint on file. ? ?Subjective  ?  ?HPI  ?Patient comes in today for follow-up.  He is feeling fairly well.  He remains active. ?Hypertension, follow-up ? ?BP Readings from Last 3 Encounters:  ?10/08/21 122/74  ?09/25/21 122/80  ?07/16/21 133/70  ? Wt Readings from Last 3 Encounters:  ?10/08/21 167 lb (75.8 kg)  ?09/25/21 169 lb (76.7 kg)  ?07/16/21 169 lb (76.7 kg)  ?  ? ?He was last seen for hypertension 3 months ago.  ?Management since that visit includes; taking lisinopril 20 mg and triamterene-hydrochlorothiazide 37.5-25 mg. ? ? ?Symptoms: ?No chest pain No chest pressure  ?No palpitations No syncope  ?No dyspnea No orthopnea  ?No paroxysmal nocturnal dyspnea No lower extremity edema  ? ?Pertinent labs ?Lab Results  ?Component Value Date  ? CHOL 171 03/15/2021  ? HDL 83 03/15/2021  ? [place] 75 03/15/2021  ? TRIG 69 03/15/2021  ? CHOLHDL 2.1 03/15/2021  ? Lab Results  ?Component Value Date  ? NA 140 07/16/2021  ? K 4.3 07/16/2021  ? CREATININE 1.23 07/16/2021  ? EGFR 60 07/16/2021  ? GLUCOSE 112 (H) 07/16/2021  ? TSH 3.350 07/16/2021  ?  ? ?The 10-year ASCVD risk score (Arnett DK, et al., 2019) is: 26.8% ? ?--------------------------------------------------------------------------------------------------- ? ? ?Medications: ?Outpatient Medications Prior to Visit  ?Medication Sig  ? aspirin EC 81 MG tablet Take 81 mg by mouth at bedtime.  ? atorvastatin (LIPITOR) 20 MG tablet TAKE 1 TABLET BY MOUTH EVERYDAY AT BEDTIME  ? lisinopril (ZESTRIL) 20 MG tablet TAKE 1 TABLET BY MOUTH EVERY DAY  ? PRESCRIPTION MEDICATION betahistine  ? triamterene-hydrochlorothiazide (DYAZIDE) 37.5-25 MG capsule TAKE 1 CAPSULE BY MOUTH EVERY DAY (Patient not taking: Reported on 10/08/2021)  ? ?No facility-administered medications  prior to visit.  ? ? ?Review of Systems  ?Constitutional:  Negative for appetite change, chills and fever.  ?Respiratory:  Negative for chest tightness, shortness of breath and wheezing.   ?Cardiovascular:  Negative for chest pain and palpitations.  ?Gastrointestinal:  Negative for abdominal pain, nausea and vomiting.  ? ?  ?  Objective  ?  ?BP 122/74 (BP Location: Right Arm, Patient Position: Sitting, Cuff Size: Normal)   Pulse (!) 59   Temp 98.3 ?F (36.8 ?C) (Oral)   Wt 167 lb (75.8 kg)   SpO2 97%   BMI 21.44 kg/m?  ?  ? ?Physical Exam ?Vitals reviewed.  ?Constitutional:   ?   General: He is not in acute distress. ?   Appearance: He is well-developed.  ?HENT:  ?   Head: Normocephalic and atraumatic.  ?   Right Ear: Hearing normal.  ?   Left Ear: Hearing normal.  ?   Nose: Nose normal.  ?Eyes:  ?   General: Lids are normal. No scleral icterus.    ?   Right eye: No discharge.     ?   Left eye: No discharge.  ?   Conjunctiva/sclera: Conjunctivae normal.  ?Cardiovascular:  ?   Rate and Rhythm: Normal rate and regular rhythm.  ?   Heart sounds: Normal heart sounds.  ?Pulmonary:  ?   Effort: Pulmonary effort is normal. No respiratory distress.  ?   Breath sounds: Normal breath sounds.  ?Musculoskeletal:  ?   Right  lower leg: No edema.  ?   Left lower leg: No edema.  ?Skin: ?   General: Skin is warm and dry.  ?   Findings: No lesion or rash.  ?Neurological:  ?   General: No focal deficit present.  ?   Mental Status: He is alert and oriented to person, place, and time.  ?Psychiatric:     ?   Mood and Affect: Mood normal.     ?   Speech: Speech normal.     ?   Behavior: Behavior normal.     ?   Thought Content: Thought content normal.     ?   Judgment: Judgment normal.  ?  ? ? ? ?No results found for any visits on 10/08/21. ? Assessment & Plan  ?  ? ?1. Essential hypertension ?Good blood pressure control. ? ?2. Coronary artery disease involving native coronary artery of native heart without angina pectoris ?Risk  factors treated with atorvastatin and blood pressure control ? ?3. Benign essential tremor ? ? ?4. Peripheral edema- right lower leg  ? ? ?5. Hypercholesterolemia without hypertriglyceridemia ?Atorvastatin ? ? ?No follow-ups on file.  ?   ? ?I, [name], MD, have reviewed all documentation for this visit. The documentation on 10/11/21 for the exam, diagnosis, procedures, and orders are all accurate and complete. ? ? ? ?Paulett Kaufhold Cranford Mon, MD  ?[facility] ?[phone] (phone) ?[phone] (fax) ? ?[facility] ?

## 2021-10-20 ENCOUNTER — Other Ambulatory Visit: Payer: Self-pay | Admitting: Family Medicine

## 2021-10-20 DIAGNOSIS — E782 Mixed hyperlipidemia: Secondary | ICD-10-CM

## 2021-11-01 ENCOUNTER — Other Ambulatory Visit: Payer: Self-pay

## 2021-11-01 ENCOUNTER — Emergency Department
Admission: EM | Admit: 2021-11-01 | Discharge: 2021-11-01 | Disposition: A | Discharge status: Home / Self Care | Payer: Medicare Other | Attending: Emergency Medicine | Admitting: Emergency Medicine

## 2021-11-01 ENCOUNTER — Emergency Department: Payer: Medicare Other

## 2021-11-01 DIAGNOSIS — Z20822 Contact with and (suspected) exposure to covid-19: Secondary | ICD-10-CM | POA: Insufficient documentation

## 2021-11-01 DIAGNOSIS — H8109 Meniere's disease, unspecified ear: Secondary | ICD-10-CM | POA: Diagnosis not present

## 2021-11-01 DIAGNOSIS — R059 Cough, unspecified: Secondary | ICD-10-CM | POA: Diagnosis not present

## 2021-11-01 DIAGNOSIS — R112 Nausea with vomiting, unspecified: Secondary | ICD-10-CM | POA: Diagnosis not present

## 2021-11-01 DIAGNOSIS — I2510 Atherosclerotic heart disease of native coronary artery without angina pectoris: Secondary | ICD-10-CM | POA: Insufficient documentation

## 2021-11-01 DIAGNOSIS — D72819 Decreased white blood cell count, unspecified: Secondary | ICD-10-CM | POA: Insufficient documentation

## 2021-11-01 DIAGNOSIS — I10 Essential (primary) hypertension: Secondary | ICD-10-CM | POA: Diagnosis not present

## 2021-11-01 DIAGNOSIS — K449 Diaphragmatic hernia without obstruction or gangrene: Secondary | ICD-10-CM | POA: Diagnosis not present

## 2021-11-01 DIAGNOSIS — R110 Nausea: Secondary | ICD-10-CM | POA: Diagnosis not present

## 2021-11-01 DIAGNOSIS — R42 Dizziness and giddiness: Secondary | ICD-10-CM | POA: Diagnosis not present

## 2021-11-01 LAB — BASIC METABOLIC PANEL
Anion gap: 7 (ref 5–15)
BUN: 15 mg/dL (ref 8–23)
CO2: 23 mmol/L (ref 22–32)
Calcium: 8.8 mg/dL — ABNORMAL LOW (ref 8.9–10.3)
Chloride: 107 mmol/L (ref 98–111)
Creatinine, Ser: 1.11 mg/dL (ref 0.61–1.24)
GFR, Estimated: >60 mL/min (ref >60)
Glucose, Bld: 150 mg/dL — ABNORMAL HIGH (ref 70–99)
Potassium: 3.5 mmol/L (ref 3.5–5.1)
Sodium: 137 mmol/L (ref 135–145)

## 2021-11-01 LAB — CBC
HCT: 40.2 % (ref 39.0–52.0)
Hemoglobin: 13.6 g/dL (ref 13.0–17.0)
MCH: 32.5 pg (ref 26.0–34.0)
MCHC: 33.8 g/dL (ref 30.0–36.0)
MCV: 95.9 fL (ref 80.0–100.0)
Platelets: 186 10*3/uL (ref 150–400)
RBC: 4.19 MIL/uL — ABNORMAL LOW (ref 4.22–5.81)
RDW: 12.9 % (ref 11.5–15.5)
WBC: 3.9 10*3/uL — ABNORMAL LOW (ref 4.0–10.5)
nRBC: 0 % (ref 0.0–0.2)

## 2021-11-01 LAB — SARS CORONAVIRUS 2 BY RT PCR: SARS Coronavirus 2 by RT PCR: NEGATIVE

## 2021-11-01 NOTE — ED Notes (Signed)
Pt has a hx with vertigo and takes medicine daily. Pt has had a issue with his vertigo since Sunday. Pt has nausea and vomiting with dizziness.

## 2021-11-01 NOTE — ED Provider Notes (Signed)
[facility] Provider Note    Event Date/Time   First MD Initiated Contact with Patient 11/01/21 1128     (approximate)   History   Dizziness   HPI  Zachary [name] is a 81 y.o. male who presents to the ED for evaluation of Dizziness   I reviewed PCP visit from 5/8.  History of HTN, CAD, benign essential tremor, HLD and chronic RLL peripheral edema. I review ENT clinic visit from 10/14.  I cannot see any notes, but he was diagnosed with Mnire's disease.  Patient reports a long history of Mnire's disease, controlled with betahistine for multiple years.  Patient reports 2 episodes or "attacks" of his typical vertigo this weekend.  Reports the one yesterday was quite severe, but passed rather quickly as they often do.  He reports feeling better now.  He has no falls, syncope, fevers or head trauma.  Reports compliance with his medication.  Reports a dry cough for a few days without productive cough, chest pain, shortness of breath, fever or known sick contacts   Physical Exam   Triage Vital Signs: ED Triage Vitals [11/01/21 1019]  Enc Vitals Group     BP 138/90     Pulse Rate 69     Resp 16     Temp 98.6 F (37 C)     Temp Source Oral     SpO2 96 %     Weight 170 lb (77.1 kg)     Height '6\' 2"'$  (1.88 m)     Head Circumference      Peak Flow      Pain Score      Pain Loc      Pain Edu?      Excl. in [place]?     Most recent vital signs: Vitals:   11/01/21 1019  BP: 138/90  Pulse: 69  Resp: 16  Temp: 98.6 F (37 C)  SpO2: 96%    General: Awake, no distress.  Pleasant and conversational.  Springs up from bed and ambulates around the room with normal gait while being asymptomatic.  Looks well. CV:  Good peripheral perfusion.  Resp:  Normal effort.  CTA B Abd:  No distention.  MSK:  No deformity noted.  Neuro:  No focal deficits appreciated. Cranial nerves II through XII intact 5/5 strength and sensation in all 4  extremities Other:  TMs clear bilaterally   ED Results / Procedures / Treatments   Labs (all labs ordered are listed, but only abnormal results are displayed) Labs Reviewed  BASIC METABOLIC PANEL - Abnormal; Notable for the following components:      Result Value   Glucose, Bld 150 (*)    Calcium 8.8 (*)    All other components within normal limits  CBC - Abnormal; Notable for the following components:   WBC 3.9 (*)    RBC 4.19 (*)    All other components within normal limits  SARS CORONAVIRUS 2 BY RT PCR  URINALYSIS, ROUTINE W REFLEX MICROSCOPIC  CBG MONITORING, ED    EKG Sinus rhythm, rate of 68 bpm.  Normal axis and intervals.  Nonspecific ST changes inferiorly and laterally without STEMI.  RADIOLOGY CXR interpreted by me without evidence of acute cardiopulmonary pathology.  Official radiology report(s): DG Chest 2 View  Result Date: 11/01/2021 CLINICAL DATA:  Cough. EXAM: CHEST - 2 VIEW COMPARISON:  February 14, 2020. FINDINGS: The heart size and mediastinal contours are within normal limits. Both lungs are  clear. The visualized skeletal structures are unremarkable. Stable hiatal hernia. IMPRESSION: No active cardiopulmonary disease.  Stable hiatal hernia. Electronically Signed   By: Marijo Conception M.D.   On: 11/01/2021 12:13    PROCEDURES and INTERVENTIONS:  Procedures  Medications - No data to display   IMPRESSION / MDM / [place] / ED COURSE  I reviewed the triage vital signs and the nursing notes.  Differential diagnosis includes, but is not limited to, Mnire's disease, intracranial hemorrhage, stroke, COVID, viral syndrome  {Patient presents with symptoms of an acute illness or injury that is potentially life-threatening.  81 year old male with known Mnire's disease presents to the ED after a couple episodes of vertigo, asymptomatic here and looking well, suitable for outpatient management.  He has normal vital signs and looks systemically  well.  Reassuring neurologic examination without signs of neurologic or vascular deficits, no signs of trauma.  Screening blood work shows mild leukopenia, otherwise normal metabolic panel and CBC.  Testing negative for COVID and has a clear CXR.  EKG without interval changes to suggest any cardiogenic dizziness or syncope.  I considered observation admission for this patient, but ultimately we decided upon outpatient management and return precautions.  Clinical Course as of 11/01/21 1237  Thu Nov 01, 2021  1234 Reassessed.  Still feels well.  We discussed continuing his betahistine and following up with his ENT.  We discussed return precautions. [DS]    Clinical Course User Index [DS] Vladimir Crofts, MD     FINAL CLINICAL IMPRESSION(S) / ED DIAGNOSES   Final diagnoses:  Vertigo  Meniere's disease, unspecified laterality     Rx / DC Orders   ED Discharge Orders     None        Note:  This document was prepared using Dragon voice recognition software and may include unintentional dictation errors.   Vladimir Crofts, MD 11/01/21 [phone]

## 2021-11-01 NOTE — ED Triage Notes (Signed)
Pt comes into the ED via EMS from home with c/o dizziness with N/V since yesterday, hx of vertigo,  HR63 97%RA 153/91

## 2021-11-08 DIAGNOSIS — L820 Inflamed seborrheic keratosis: Secondary | ICD-10-CM | POA: Diagnosis not present

## 2021-11-08 DIAGNOSIS — L570 Actinic keratosis: Secondary | ICD-10-CM | POA: Diagnosis not present

## 2021-11-08 DIAGNOSIS — D485 Neoplasm of uncertain behavior of skin: Secondary | ICD-10-CM | POA: Diagnosis not present

## 2021-11-16 ENCOUNTER — Telehealth: Payer: Self-pay

## 2021-11-16 NOTE — Telephone Encounter (Signed)
Please advise referral?  

## 2021-11-16 NOTE — Telephone Encounter (Unsigned)
Copied from [place] [id number]. Topic: Referral - Question >> Nov 16, 2021  2:30 PM Rudene Anda wrote: Reason for CRM: Pt wanted to know if Dr Rosanna Randy would send in a referral to [facility] in [place], [place] due to his vertigo, please advise.

## 2021-11-19 ENCOUNTER — Other Ambulatory Visit: Payer: Self-pay | Admitting: *Deleted

## 2021-11-19 DIAGNOSIS — R42 Dizziness and giddiness: Secondary | ICD-10-CM

## 2021-11-19 NOTE — Telephone Encounter (Signed)
Referral ordered

## 2021-11-21 ENCOUNTER — Telehealth: Payer: Self-pay | Admitting: Family Medicine

## 2021-11-21 NOTE — Telephone Encounter (Signed)
Referral Request - Has patient seen PCP for this complaint? Yes.    Referral for which specialty: [facility] in [place]  Preferred provider/office: [facility]  Reason for referral: Vertigo: patient states he has been treated for vertigo in the past by provider and was recommended [facility] by a neighbor

## 2021-11-22 NOTE — Therapy (Incomplete)
OUTPATIENT PHYSICAL THERAPY VESTIBULAR EVALUATION   Patient Name: Zachary [name] MRN: [id number] DOB:08-Apr-1941, 81 y.o., male Today's Date: 11/22/2021  PCP: Jerrol Banana., MD REFERRING PROVIDER: Jerrol Banana.,*    Past Medical History:  Diagnosis Date   B12 deficiency anemia 08/03/2019   Hypercholesterolemia    Hypertension    Vertigo    last episode approx 09/2018   Past Surgical History:  Procedure Laterality Date   APPENDECTOMY     CATARACT EXTRACTION Bilateral    COLONOSCOPY WITH PROPOFOL N/A 12/24/2018   Procedure: COLONOSCOPY WITH PROPOFOL;  Surgeon: Lucilla Lame, MD;  Location: [facility] ENDOSCOPY;  Service: Endoscopy;  Laterality: N/A;   COLONOSCOPY WITH PROPOFOL N/A 12/13/2019   Procedure: COLONOSCOPY WITH BIOPSY ;  Surgeon: Lucilla Lame, MD;  Location: [place];  Service: Endoscopy;  Laterality: N/A;  priority 3   PLEURAL SCARIFICATION  05/2015   POLYPECTOMY N/A 12/13/2019   Procedure: POLYPECTOMY;  Surgeon: Lucilla Lame, MD;  Location: [place];  Service: Endoscopy;  Laterality: N/A;   Patient Active Problem List   Diagnosis Date Noted   Personal history of colonic polyps    B12 deficiency anemia 08/03/2019   Elevated MCV 08/03/2019   Cyst, baker's knee, right 07/27/2019   Peripheral edema- right lower leg  07/27/2019   Encounter for screening colonoscopy 04/12/2019   Polyp of transverse colon 04/12/2019   Coronary artery disease involving native coronary artery of native heart without angina pectoris 04/12/2019   Benign neoplasm of cecum 04/12/2019   Valvular heart disease 04/12/2019   Benign neoplasm of cecum    Pneumothorax on right 05/15/2015   Benign essential tremor 02/28/2015   Mixed hyperlipidemia 02/28/2015   Essential hypertension 02/28/2015   Hypercholesterolemia without hypertriglyceridemia 02/28/2015   Vertigo 02/28/2015     PCP: Jerrol Banana., MD  REFERRING PROVIDER: Jerrol Banana.,*  REFERRING DIAGNOSIS: R42 (ICD-10-CM) - Vertigo   THERAPY DIAG: No diagnosis found.  RATIONALE FOR EVALUATION AND TREATMENT: Rehabilitation  ONSET DATE: ***  FOLLOW UP APPT WITH PROVIDER: {yes/no:20286}    SUBJECTIVE:   Chief Complaint:  ***  Pertinent History Zachary [name] is a 81 y.o. male who presents to the ED for evaluation of Dizziness   I reviewed PCP visit from 5/8.  History of HTN, CAD, benign essential tremor, HLD and chronic RLL peripheral edema. I review ENT clinic visit from 10/14.  I cannot see any notes, but he was diagnosed with Mnire's disease.   Patient reports a long history of Mnire's disease, controlled with betahistine for multiple years.  Patient reports 2 episodes or "attacks" of his typical vertigo this weekend.  Reports the one yesterday was quite severe, but passed rather quickly as they often do.  He reports feeling better now.  He has no falls, syncope, fevers or head trauma.  Reports compliance with his medication.   Reports a dry cough for a few days without productive cough, chest pain, shortness of breath, fever or known sick contacts  81 year old male with known Mnire's disease presents to the ED after a couple episodes of vertigo, asymptomatic here and looking well, suitable for outpatient management. He has normal vital signs and looks systemically well. Reassuring neurologic examination without signs of neurologic or vascular deficits, no signs of trauma. Screening blood work shows mild leukopenia, otherwise normal metabolic panel and CBC. Testing negative for COVID and has a clear CXR. EKG without interval changes to suggest any cardiogenic dizziness or syncope.  I considered observation admission for this patient, but ultimately we decided upon outpatient management and return precautions.   Description of dizziness: {DizzyDescription:27368} Frequency:  Duration: Symptom nature: {DizzyNature:27369} Progression of symptoms since  onset: {DizzyProgress:27370} History of similar episodes: {yes/no:20286}  Provocative Factors: Easing Factors:  Auditory complaints (tinnitus, pain, drainage, hearing loss, aural fullness): {yes/no:20286} Vision changes (diplopia, visual field loss, recent changes, recent eye exam): {yes/no:20286} Chest pain/palpitations: {yes/no:20286} History of head injury/concussion: {yes/no:20286} Stress/anxiety: {yes/no:20286}  Has patient fallen in last 6 months? {yes/no:20286}, Number of falls: *** Pertinent pain: {yes/no:20286} Dominant hand: {RIGHT/LEFT:20294} Imaging: {yes/no:20286}  Prior level of function: {PLOF:24004} Occupational demands: Hobbies:  Red Flags: (dysarthria, dysphagia, drop attacks, bowel and bladder changes, recent weight loss/gain) Review of systems negative for red flags.   PRECAUTIONS: {Therapy precautions:24002}  WEIGHT BEARING RESTRICTIONS {Yes ***/No:24003}  LIVING ENVIRONMENT: Lives with: {[facility] lives with:25569::"lives with their family"} Lives in: {Lives in:25570} Stairs: {yes/no:20286}; {Stairs:24000} Has following equipment at home: {Assistive devices:23999}  PATIENT GOALS ***   OBJECTIVE  EXAMINATION  POSTURE: No gross deficits contributing to symptoms  NEUROLOGICAL SCREEN: (2+ unless otherwise noted.) N=normal  Ab=abnormal  Level Dermatome R L Myotome R L Reflex R L  C3 Anterior Neck N N Sidebend C2-3 N N Jaw CN V    C4 Top of Shoulder N N Shoulder Shrug C4 N N Hoffman's UMN    C5 Lateral Upper Arm N N Shoulder ABD C4-5 N N Biceps C5-6    C6 Lateral Arm/ Thumb N N Arm Flex/ Wrist Ext C5-6 N N Brachiorad. C5-6    C7 Middle Finger N N Arm Ext//Wrist Flex C6-7 N N Triceps C7    C8 4th & 5th Finger N N Flex/ Ext Carpi Ulnaris C8 N N Patellar (L3-4)    T1 Medial Arm N N Interossei T1 N N Gastrocnemius    L2 Medial thigh/groin N N Illiopsoas (L2-3) N N     L3 Lower thigh/med.knee N N Quadriceps (L3-4) N N     L4 Medial leg/lat thigh N N Tibialis  Ant (L4-5) N N     L5 Lat. leg & dorsal foot N N EHL (L5) N N     S1 post/lat foot/thigh/leg N N Gastrocnemius (S1-2) N N     S2 Post./med. thigh & leg N N Hamstrings (L4-S3) N N       CRANIAL NERVES II, III, IV, VI: Pupils equal and reactive to light, visual acuity and visual fields are intact, extraocular muscles are intact  V: Facial sensation is intact and symmetric bilaterally  VII: Facial strength is intact and symmetric bilaterally  VIII: Hearing is normal as tested by gross conversation IX, X: Palate elevates midline, normal phonation, uvula midline XI: Shoulder shrug strength is intact  XII: Tongue protrudes midline    SOMATOSENSORY Grossly intact to light touch bilateral UEs/LEs as determined by testing dermatomes C2-T2 and L2-S2. Proprioception and hot/cold testing deferred on this date.   COORDINATION Finger to Nose: Normal Heel to Shin: Normal Pronator Drift: Negative Rapid Alternating Movements: Normal Finger to Thumb Opposition: Normal    RANGE OF MOTION Cervical Spine AROM WFL and painless in all planes. No focal deficits in AROM noted in BUE/BLE   MANUAL MUSCLE TESTING BUE/BLE strength WNL without focal deficits   TRANSFERS/GAIT Independent for transfers and ambulation without assistive device    PATIENT SURVEYS FOTO: ,predicted improvement to  ABC: % DHI: /100   POSTURAL CONTROL TESTS  Clinical Test of Sensory Interaction for Balance (CTSIB):  CONDITION TIME STRATEGY SWAY  Eyes open, firm surface 30 seconds ankle   Eyes closed, firm surface 30 seconds ankle   Eyes open, foam surface 30 seconds ankle   Eyes closed, foam surface 30 seconds ankle     OCULOMOTOR / VESTIBULAR TESTING  Oculomotor Exam- Room Light  Findings Comments  Ocular Alignment {normal/abnormal/not examined:14677}   Ocular ROM {normal/abnormal/not examined:14677}   Spontaneous Nystagmus {normal/abnormal/not examined:14677}   Gaze-Holding Nystagmus {normal/abnormal/not  examined:14677}   End-Gaze Nystagmus {normal/abnormal/not examined:14677}   Vergence (normal 2-3") {normal/abnormal/not examined:14677}   Smooth Pursuit {normal/abnormal/not examined:14677}   Cross-Cover Test {normal/abnormal/not examined:14677}   Saccades {normal/abnormal/not examined:14677}   VOR Cancellation {normal/abnormal/not examined:14677}   Left Head Impulse {normal/abnormal/not examined:14677}   Right Head Impulse {normal/abnormal/not examined:14677}   Static Acuity {normal/abnormal/not examined:14677}   Dynamic Acuity {normal/abnormal/not examined:14677}     Oculomotor Exam- Fixation Suppressed  Findings Comments  Ocular Alignment {normal/abnormal/not examined:14677}   Spontaneous Nystagmus {normal/abnormal/not examined:14677}   Gaze-Holding Nystagmus {normal/abnormal/not examined:14677}   End-Gaze Nystagmus {normal/abnormal/not examined:14677}   Head Shaking Nystagmus {normal/abnormal/not examined:14677}   Pressure-Induced Nystagmus {normal/abnormal/not examined:14677}   Hyperventilation Induced Nystagmus {normal/abnormal/not examined:14677}   Skull Vibration Induced Nystagmus {normal/abnormal/not examined:14677}     BPPV TESTS:  Symptoms Duration Intensity Nystagmus  L Dix-Hallpike    None  R Dix-Hallpike    None  L Head Roll    None  R Head Roll      L Sidelying Test      R Sidelying Test      (blank = not tested)   FUNCTIONAL OUTCOME MEASURES   Results Comments  BERG    DGI    FGA    TUG    5TSTS    6 Minute Walk Test    10 Meter Gait Speed Self-selected: s = m/s; Fastest: s = m/s   ABC Scale    DHI    (blank = not tested)   TODAY'S TREATMENT  ***    ASSESSMENT:  CLINICAL IMPRESSION: Patient is a *** y.o. *** who was seen today for physical therapy evaluation and treatment for dizzness. Objective impairments include {opptimpairments:25111}. These impairments are limiting patient from {activity limitations:25113}. Personal factors including  {Personal factors:25162} are also affecting patient's functional outcome. Patient will benefit from skilled PT to address above impairments and improve overall function.  REHAB POTENTIAL: {rehabpotential:25112}  CLINICAL DECISION MAKING: {clinical decision making:25114}  EVALUATION COMPLEXITY: {Evaluation complexity:25115}   GOALS: Goals reviewed with patient? {yes/no:20286}  SHORT TERM GOALS: Target date: {follow up:25551}  Pt will be independent with HEP for dizziness in order to decrease symptoms, improve balance,decrease fall risk, and improve function at home. Baseline: Goal status: INITIAL   LONG TERM GOALS: Target date: {follow up:25551}  Pt will increase FOTO to at least *** to demonstrate significant improvement in function at home related to dizziness.  Baseline:  Goal status: INITIAL  2.  Pt will decrease DHI score by at least 18 points in order to demonstrate clinically significant reduction in disability related to dizziness.  Baseline: *** Goal status: INITIAL  3.  Pt will improve ABC by at least 13% in order to demonstrate clinically significant improvement in balance confidence.      Baseline: *** Goal status: INITIAL  4. Pt will decrease 5TSTS by at least 3 seconds in order to demonstrate clinically significant improvement in LE strength      Baseline: *** Goal status: INITIAL  5. Pt will improve DGI by at least 3 points in order to demonstrate  clinically significant improvement in balance and decreased risk for falls.     Baseline: *** Goal status: INITIAL  6. Pt will decrease TUG to below 14 seconds/decrease in order to demonstrate decreased fall risk.  Baseline: *** Goal status: INITIAL  7. Pt will increase 6MWT by at least 51m(1654f in order to demonstrate clinically significant improvement in cardiopulmonary endurance and community ambulation   Baseline: *** Goal status: INITIAL   PLAN:  PT FREQUENCY: 1x/week  PT DURATION: 8  weeks  PLANNED INTERVENTIONS: Therapeutic exercises, Therapeutic activity, Neuromuscular re-education, Balance training, Gait training, Patient/Family education, Joint manipulation, Joint mobilization, Canalith repositioning, Aquatic Therapy, Dry Needling, Cognitive remediation, Electrical stimulation, Spinal manipulation, Spinal mobilization, Cryotherapy, Moist heat, Traction, Ultrasound, Ionotophoresis '4mg'$ /ml Dexamethasone, and Manual therapy  PLAN FOR NEXT SESSION: ***   JaLyndel Safeuprich PT, DPT, GCS  Amyre Segundo 11/22/2021, 3:35 PM

## 2021-11-23 ENCOUNTER — Ambulatory Visit: Payer: Medicare Other | Attending: Family Medicine

## 2021-11-23 DIAGNOSIS — R42 Dizziness and giddiness: Secondary | ICD-10-CM | POA: Diagnosis not present

## 2021-11-26 DIAGNOSIS — H8109 Meniere's disease, unspecified ear: Secondary | ICD-10-CM | POA: Diagnosis not present

## 2021-11-26 DIAGNOSIS — H9319 Tinnitus, unspecified ear: Secondary | ICD-10-CM | POA: Diagnosis not present

## 2021-11-29 ENCOUNTER — Ambulatory Visit: Payer: Medicare Other

## 2021-11-29 DIAGNOSIS — R42 Dizziness and giddiness: Secondary | ICD-10-CM | POA: Diagnosis not present

## 2021-11-29 NOTE — Therapy (Signed)
OUTPATIENT PHYSICAL THERAPY VESTIBULAR TREATMENT   Patient Name: Zachary [name] MRN: [id number] DOB:05/21/1941, 81 y.o., male Today's Date: 11/29/2021  PCP: Jerrol Banana., MD REFERRING PROVIDER: Jerrol Banana.,*   PT End of Session - 11/29/21 1356     Visit Number 2    Number of Visits 9    Date for PT Re-Evaluation 01/19/22    Authorization Type eval: 11/23/21    PT Start Time 1400    PT Stop Time 1445    PT Time Calculation (min) 45 min    Activity Tolerance Patient tolerated treatment well    Behavior During Therapy [facility] for tasks assessed/performed              Past Medical History:  Diagnosis Date   B12 deficiency anemia 08/03/2019   Hypercholesterolemia    Hypertension    Vertigo    last episode approx 09/2018   Past Surgical History:  Procedure Laterality Date   APPENDECTOMY     CATARACT EXTRACTION Bilateral    COLONOSCOPY WITH PROPOFOL N/A 12/24/2018   Procedure: COLONOSCOPY WITH PROPOFOL;  Surgeon: Lucilla Lame, MD;  Location: [facility] ENDOSCOPY;  Service: Endoscopy;  Laterality: N/A;   COLONOSCOPY WITH PROPOFOL N/A 12/13/2019   Procedure: COLONOSCOPY WITH BIOPSY ;  Surgeon: Lucilla Lame, MD;  Location: [place];  Service: Endoscopy;  Laterality: N/A;  priority 3   PLEURAL SCARIFICATION  05/2015   POLYPECTOMY N/A 12/13/2019   Procedure: POLYPECTOMY;  Surgeon: Lucilla Lame, MD;  Location: [place];  Service: Endoscopy;  Laterality: N/A;   Patient Active Problem List   Diagnosis Date Noted   Personal history of colonic polyps    B12 deficiency anemia 08/03/2019   Elevated MCV 08/03/2019   Cyst, baker's knee, right 07/27/2019   Peripheral edema- right lower leg  07/27/2019   Encounter for screening colonoscopy 04/12/2019   Polyp of transverse colon 04/12/2019   Coronary artery disease involving native coronary artery of native heart without angina pectoris 04/12/2019   Benign neoplasm of cecum 04/12/2019   Valvular  heart disease 04/12/2019   Benign neoplasm of cecum    Pneumothorax on right 05/15/2015   Benign essential tremor 02/28/2015   Mixed hyperlipidemia 02/28/2015   Essential hypertension 02/28/2015   Hypercholesterolemia without hypertriglyceridemia 02/28/2015   Vertigo 02/28/2015     PCP: Jerrol Banana., MD  REFERRING PROVIDER: Jerrol Banana.,*  REFERRING DIAGNOSIS: R42 (ICD-10-CM) - Vertigo   THERAPY DIAG: Dizziness and giddiness  RATIONALE FOR EVALUATION AND TREATMENT: Rehabilitation  ONSET DATE: 06/03/21 (approximate by patient however per medical record symptoms have been ongoing for years)  FOLLOW UP APPT WITH PROVIDER: Yes    SUBJECTIVE:   Chief Complaint:  Dizziness  Pertinent History Pt referred by PCP for dizziness. Per patient symptoms first started around January of this year when he started feeling "swimmy-headed." Based on the medical record from PCP it appearrs that these symptoms have been going on for multiple years. He was seen by Dr. Kathyrn Sheriff at [facility] and was diagnosed with Menire's Disease. Pt reports that he was prescribed Betahistine which does appar to have shortened the duration of the attacks. He reports approximately 8-10 Menire's attacks over the last 6 months. Pt states that the attacks can least anywhere from 3-4 hours to 2-3 days. Since the onset of symptoms pt has experienced R sided hearing loss and R sided tinnitus. He is not currently taking any diuretics. He reports having three ED visits  as well as three brain MRIs due to these symptoms. Most recent MRI report within our record is from 04/12/20 which showed no cerebellopontine angle or internal auditory canal lesion. Unremarkable appearance of the seventh and eighth cranial nerves bilaterally. No evidence of acute intracranial abnormality. Stable mild cerebral white matter chronic small vessel ischemic disease. Mild ethmoid and right maxillary sinus mucosal thickening. Small left  maxillary sinus mucous retention cyst. Pt has history of HTN, CAD, benign essential tremor, HLD and chronic RLL peripheral edema.  Description of dizziness: vertigo Frequency: 8-10 attacks over the last 6 motnhs Duration: 3-4 hours to multiple days Symptom nature: intermittent and spontaneous Progression of symptoms since onset: no change in severity but frequency has increased; History of similar episodes: No  Provocative Factors: Unknown Easing Factors: betahistine, wait for symptoms to pass  Auditory complaints (tinnitus, pain, drainage, hearing loss, aural fullness): Yes, R sided hearing loss and R tinntius, denies pain or aural fullness Vision changes (diplopia, visual field loss, recent changes, recent eye exam): No Chest pain/palpitations: No History of head injury/concussion: No Stress/anxiety: No Headaches/migraines: No history of migraines, pt reports bilateral headache during episodes, denies visual changes/floaters/photophobia/phonophbia Syncope: Denies syncope but does report some lightheadedness Numbness/tingling/focal weakness: No  Has patient fallen in last 6 months? Yes, Number of falls: 4, all related to vertigo episodes Pertinent pain: No Dominant hand: right Imaging: Yes  Prior level of function: Independent Occupational demands: Hobbies:  Red Flags: (dysarthria, dysphagia, drop attacks, bowel and bladder changes, recent weight loss/gain) Review of systems negative for red flags.   PRECAUTIONS: Fall  WEIGHT BEARING RESTRICTIONS No  LIVING ENVIRONMENT: Lives with: lives with their spouse Lives in: House/apartment Stairs: No;  Has following equipment at home: Environmental consultant - 4 wheeled, shower chair, and Grab bars  PATIENT GOALS  Decrease dizziness   OBJECTIVE  EXAMINATION  POSTURE: No gross deficits contributing to symptoms  NEUROLOGICAL SCREEN: (2+ unless otherwise noted.) N=normal  Ab=abnormal  Level Dermatome R L Myotome R L Reflex R L  C3 Anterior  Neck N N Sidebend C2-3 N N Jaw CN V    C4 Top of Shoulder N N Shoulder Shrug C4 N N Hoffman's UMN    C5 Lateral Upper Arm N N Shoulder ABD C4-5 N N Biceps C5-6    C6 Lateral Arm/ Thumb N N Arm Flex/ Wrist Ext C5-6 N N Brachiorad. C5-6    C7 Middle Finger N N Arm Ext//Wrist Flex C6-7 N N Triceps C7    C8 4th & 5th Finger N N Flex/ Ext Carpi Ulnaris C8 N N Patellar (L3-4)    T1 Medial Arm N N Interossei T1 N N Gastrocnemius    L2 Medial thigh/groin N N Illiopsoas (L2-3) N N     L3 Lower thigh/med.knee N N Quadriceps (L3-4) N N     L4 Medial leg/lat thigh N N Tibialis Ant (L4-5) N N     L5 Lat. leg & dorsal foot N N EHL (L5) N N     S1 post/lat foot/thigh/leg N N Gastrocnemius (S1-2) N N     S2 Post./med. thigh & leg N N Hamstrings (L4-S3) N N       CRANIAL NERVES II, III, IV, VI: Pupils equal and reactive to light, visual acuity and visual fields are intact, Pt with notable loss of vertical gaze bilaterally  but otherwise extraocular muscles are intact  V: Facial sensation is intact and symmetric bilaterally  VII: Facial strength is intact and symmetric bilaterally  VIII: Hearing is normal as tested by gross conversation IX, X: Palate elevates midline, normal phonation, uvula midline XI: Shoulder shrug strength is intact  XII: Tongue protrudes midline    SOMATOSENSORY Deferred   COORDINATION Finger to Nose: Normal with the exception of BUE tremors Heel to Shin: Normal Pronator Drift: Negative Rapid Alternating Movements: Normal Finger to Thumb Opposition: Difficult for patient bilaterally secondary to tremor  Resting tremor noted BUE, worse with movement    RANGE OF MOTION Cervical Spine AROM with mild to moderate loss but painless in all planes. No focal deficits in AROM noted in [place] strength WNL without focal deficits   TRANSFERS/GAIT Independent for transfers and ambulation without assistive device    PATIENT SURVEYS FOTO:  45,predicted improvement to 53  ABC: 78.1% DHI: 4/100   POSTURAL CONTROL TESTS  Clinical Test of Sensory Interaction for Balance (CTSIB): Deferred   OCULOMOTOR / VESTIBULAR TESTING  Oculomotor Exam- Room Light  Findings Comments  Ocular Alignment normal   Ocular ROM abnormal Decreased vertical gaze in both eyes  Spontaneous Nystagmus abnormal L beating pure horizontal nystagmus, does not fatigue  Gaze-Holding Nystagmus normal 2nd degree L beating pure horizontal nystagmus  End-Gaze Nystagmus normal See above  Vergence (normal 2-3") not examined   Smooth Pursuit normal   Cross-Cover Test not examined   Saccades abnormal Slow and hypometric  VOR Cancellation normal   Left Head Impulse not examined Pt unable to relax head/neck so unable to perform  Right Head Impulse not examined See above  Static Acuity not examined   Dynamic Acuity not examined     Oculomotor Exam- Fixation Suppressed  Findings Comments  Ocular Alignment normal   Spontaneous Nystagmus abnormal L beating pure horizontal nystagmus, does not fatigue  Gaze-Holding Nystagmus abnormal 2nd degree L beating pure horizontal nystagmus  End-Gaze Nystagmus abnormal See above  Head Shaking Nystagmus abnormal Slight increase in frequency of pure horizontal L beating nystagmus  Pressure-Induced Nystagmus not examined   Hyperventilation Induced Nystagmus not examined   Skull Vibration Induced Nystagmus not examined     BPPV TESTS:  Symptoms Duration Intensity Nystagmus  L Dix-Hallpike None   None  R Dix-Hallpike None   None  L Head Roll None   None  R Head Roll None   None  L Sidelying Test      R Sidelying Test      (blank = not tested)   FUNCTIONAL OUTCOME MEASURES: Deferred    TODAY'S TREATMENT   SUBJECTIVE: Pt reports that he is doing well today. So far he has had a good week. He saw Dr. Kathyrn Sheriff at [facility] on Monday and he advised Mr. Maule to increase his betahistine to twice/day which he  started on Tuesday. Otherwise no specific changes since the initial evaluation. No specific questions or concerns currently.   PAIN: Denies  Neuromuscular Re-education  NuStep L2-3 x 5 minutes for warm-up during interval history (4 minutes unbilled); Additional outcome measures performed: mCTSIB: 30s in all conditions with 3+ sway in condition 4; BERG: 53/56; 5TSTS: 13.4s; DGI: 21/24;  VOR x 1 horizontal in sitting 60s x 3 with target arms length away on plain wall;  BPPV TESTS (on inverted mat table):  Symptoms Duration Intensity Nystagmus  L Dix-Hallpike None   None  R Dix-Hallpike None   None  L Head Roll None   None  R Head Roll None   None  L Sidelying Test  R Sidelying Test      (blank = not tested)   HOME EXERCISE PROGRAM Access Code: [email or web address] URL: [email or web address] Date: 11/29/2021 Prepared by: Roxana Hires  Exercises - Seated Gaze Stabilization with Head Rotation  - 2 x daily - 7 x weekly - 3 reps - 60 seconds hold - Single Leg Stance with Support  - 2 x daily - 7 x weekly - 2 x 30s on each leg hold   ASSESSMENT:  CLINICAL IMPRESSION: Patient demonstrates excellent motivation during session today. Updated additional outcome measures during session today. He is able to maintain his balance in all conditions of the mCTSIB but with severe sway during condition 4. He scored 53/56 on the BERG and 21/24 on the DGI indicating mild balance deficits. Five Time Sit to Stand WNL. Repeated BPPV testing which remains negative. Unable to perform DVA as pt is unable to relax his neck. Initiated gaze stabilization exercises and issued HEP. Pt encouraged to follow-up as scheduled. Pt will benefit from PT services to address deficits in balance and dizziness in order to return to full function at home.   REHAB POTENTIAL: Fair    CLINICAL DECISION MAKING: Unstable/unpredictable  EVALUATION COMPLEXITY: High   GOALS: Goals reviewed with patient? No  SHORT  TERM GOALS: Target date: 12/21/2021  Pt will be independent with HEP for dizziness in order to decrease symptoms, improve balance,decrease fall risk, and improve function at home. Baseline: Goal status: INITIAL   LONG TERM GOALS: Target date: 01/18/2022  Pt will increase FOTO to at least 53 to demonstrate significant improvement in function at home related to dizziness.  Baseline: 11/23/21: 45 Goal status: INITIAL  2.  Pt will report a 50% reduction in his vertigo episodes in order to demonstrate clinically significant reduction in disability related to dizziness.  Baseline:  Goal status: INITIAL  3. Pt will improve DGI by at least 3 points in order to demonstrate clinically significant improvement in balance and decreased risk for falls.     Baseline: 11/23/21: To be completed; 11/29/21: 21/24 Goal status: INITIAL   PLAN:  PT FREQUENCY: 1x/week  PT DURATION: 8 weeks  PLANNED INTERVENTIONS: Therapeutic exercises, Therapeutic activity, Neuromuscular re-education, Balance training, Gait training, Patient/Family education, Joint manipulation, Joint mobilization, Canalith repositioning, Aquatic Therapy, Dry Needling, Cognitive remediation, Electrical stimulation, Spinal manipulation, Spinal mobilization, Cryotherapy, Moist heat, Traction, Ultrasound, Ionotophoresis '4mg'$ /ml Dexamethasone, and Manual therapy  PLAN FOR NEXT SESSION: continue gaze stabilization progression, habituation exercises, balance exercises.   Lyndel Safe Galya Dunnigan PT, DPT, GCS  Cephus Tupy 11/29/2021, 5:32 PM

## 2021-12-06 ENCOUNTER — Ambulatory Visit: Payer: Medicare Other

## 2021-12-06 DIAGNOSIS — R42 Dizziness and giddiness: Secondary | ICD-10-CM

## 2021-12-12 NOTE — Therapy (Signed)
OUTPATIENT PHYSICAL THERAPY VESTIBULAR TREATMENT   Patient Name: Zachary [name], 81 y.o., Zachary [name] Today's Date: 12/13/2021  PCP: Zachary Banana., MD REFERRING PROVIDER: Jerrol Banana.,*   [name] End of Session - 12/13/21 1403     Visit Number 3    Number of Visits 9    Date for [name] Re-Evaluation 01/19/22    Authorization Type eval: 11/23/21    [name] Start Time 1401    [name] Stop Time 1445    [name] Time Calculation (min) 44 min    Activity Tolerance Patient tolerated treatment well    Behavior During Therapy [facility] for tasks assessed/performed             Past Medical History:  Diagnosis Date   B12 deficiency anemia 08/03/2019   Hypercholesterolemia    Hypertension    Vertigo    last episode approx 09/2018   Past Surgical History:  Procedure Laterality Date   APPENDECTOMY     CATARACT EXTRACTION Bilateral    COLONOSCOPY WITH PROPOFOL N/A 12/24/2018   Procedure: COLONOSCOPY WITH PROPOFOL;  Surgeon: Zachary Lame, MD;  Location[facility] ENDOSCOPY;  Service: Endoscopy;  Laterality: N/A;   COLONOSCOPY WITH PROPOFOL N/A 12/13/2019   Procedure: COLONOSCOPY WITH BIOPSY ;  Surgeon: Zachary Lame, MD;  Location: [place];  Service: Endoscopy;  Laterality: N/A;  priority 3   PLEURAL SCARIFICATION  05/2015   POLYPECTOMY N/A 12/13/2019   Procedure: POLYPECTOMY;  Surgeon: Zachary Lame, MD;  Location: [place];  Service: Endoscopy;  Laterality: N/A;   Patient Active Problem List   Diagnosis Date Noted   Personal history of colonic polyps    B12 deficiency anemia 08/03/2019   Elevated MCV 08/03/2019   Cyst, baker's knee, right 07/27/2019   Peripheral edema- right lower leg  07/27/2019   Encounter for screening colonoscopy 04/12/2019   Polyp of transverse colon 04/12/2019   Coronary artery disease involving native coronary artery of native heart without angina pectoris 04/12/2019   Benign neoplasm of cecum 04/12/2019   Valvular heart  disease 04/12/2019   Benign neoplasm of cecum    Pneumothorax on right 05/15/2015   Benign essential tremor 02/28/2015   Mixed hyperlipidemia 02/28/2015   Essential hypertension 02/28/2015   Hypercholesterolemia without hypertriglyceridemia 02/28/2015   Vertigo 02/28/2015     PCP: Zachary Banana., MD  REFERRING PROVIDER: Jerrol Banana.,*  REFERRING DIAGNOSIS: R42 (ICD-10-CM) - Vertigo   THERAPY DIAG: Dizziness and giddiness  RATIONALE FOR EVALUATION AND TREATMENT: Rehabilitation  ONSET DATE: 06/03/21 (approximate by patient however per medical record symptoms have been ongoing for years)  FOLLOW UP APPT WITH PROVIDER: Yes    SUBJECTIVE:   Chief Complaint:  Dizziness  Pertinent History [name] referred by PCP for dizziness. Per patient symptoms first started around January of this year when he started feeling "swimmy-headed." Based on the medical record from PCP it appearrs that these symptoms have been going on for multiple years. He was seen by Dr. Kathyrn [name] at [facility]NT and was diagnosed with Menire's Disease. [name] reports that he was prescribed Betahistine which does appar to have shortened the duration of the attacks. He reports approximately 8-10 Menire's attacks over the last 6 months. [name] states that the attacks can least anywhere from 3-4 hours to 2-3 days. Since the onset of symptoms [name] has experienced R sided hearing loss and R sided tinnitus. He is not currently taking any diuretics. He reports having three ED visits as  well as three brain MRIs due to these symptoms. Most recent MRI report within our record is from 04/12/20 which showed no cerebellopontine angle or internal auditory canal lesion. Unremarkable appearance of the seventh and eighth cranial nerves bilaterally. No evidence of acute intracranial abnormality. Stable mild cerebral white matter chronic small vessel ischemic disease. Mild ethmoid and right maxillary sinus mucosal thickening. Small left  maxillary sinus mucous retention cyst. [name] has history of HTN, CAD, benign essential tremor, HLD and chronic RLL peripheral edema.  Description of dizziness: vertigo Frequency: 8-10 attacks over the last 6 motnhs Duration: 3-4 hours to multiple days Symptom nature: intermittent and spontaneous Progression of symptoms since onset: no change in severity but frequency has increased; History of similar episodes: No  Provocative Factors: Unknown Easing Factors: betahistine, wait for symptoms to pass  Auditory complaints (tinnitus, pain, drainage, hearing loss, aural fullness): Yes, R sided hearing loss and R tinntius, denies pain or aural fullness Vision changes (diplopia, visual field loss, recent changes, recent eye exam): No Chest pain/palpitations: No History of head injury/concussion: No Stress/anxiety: No Headaches/migraines: No history of migraines, [name] reports bilateral headache during episodes, denies visual changes/floaters/photophobia/phonophbia Syncope: Denies syncope but does report some lightheadedness Numbness/tingling/focal weakness: No  Has patient fallen in last 6 months? Yes, Number of falls: 4, all related to vertigo episodes Pertinent pain: No Dominant hand: right Imaging: Yes  Prior level of function: Independent Occupational demands: Hobbies:  Red Flags: (dysarthria, dysphagia, drop attacks, bowel and bladder changes, recent weight loss/gain) Review of systems negative for red flags.   PRECAUTIONS: Fall  WEIGHT BEARING RESTRICTIONS No  LIVING ENVIRONMENT: Lives with: lives with their spouse Lives in: House/apartment Stairs: No;  Has following equipment at home: Environmental consultant - 4 wheeled, shower chair, and Grab bars  PATIENT GOALS  Decrease dizziness   OBJECTIVE  EXAMINATION  POSTURE: No gross deficits contributing to symptoms  NEUROLOGICAL SCREEN: (2+ unless otherwise noted.) N=normal  Ab=abnormal  Level Dermatome R L Myotome R L Reflex R L  C3 Anterior  Neck N N Sidebend C2-3 N N Jaw CN V    C4 Top of Shoulder N N Shoulder Shrug C4 N N Hoffman's UMN    C5 Lateral Upper Arm N N Shoulder ABD C4-5 N N Biceps C5-6    C6 Lateral Arm/ Thumb N N Arm Flex/ Wrist Ext C5-6 N N Brachiorad. C5-6    C7 Middle Finger N N Arm Ext//Wrist Flex C6-7 N N Triceps C7    C8 4th & 5th Finger N N Flex/ Ext Carpi Ulnaris C8 N N Patellar (L3-4)    T1 Medial Arm N N Interossei T1 N N Gastrocnemius    L2 Medial thigh/groin N N Illiopsoas (L2-3) N N     L3 Lower thigh/med.knee N N Quadriceps (L3-4) N N     L4 Medial leg/lat thigh N N Tibialis Ant (L4-5) N N     L5 Lat. leg & dorsal foot N N EHL (L5) N N     S1 post/lat foot/thigh/leg N N Gastrocnemius (S1-2) N N     S2 Post./med. thigh & leg N N Hamstrings (L4-S3) N N       CRANIAL NERVES II, III, IV, VI: Pupils equal and reactive to light, visual acuity and visual fields are intact, [name] with notable loss of vertical gaze bilaterally  but otherwise extraocular muscles are intact  V: Facial sensation is intact and symmetric bilaterally  VII: Facial strength is intact and symmetric bilaterally  VIII:  Hearing is normal as tested by gross conversation IX, X: Palate elevates midline, normal phonation, uvula midline XI: Shoulder shrug strength is intact  XII: Tongue protrudes midline    SOMATOSENSORY Deferred   COORDINATION Finger to Nose: Normal with the exception of BUE tremors Heel to Shin: Normal Pronator Drift: Negative Rapid Alternating Movements: Normal Finger to Thumb Opposition: Difficult for patient bilaterally secondary to tremor  Resting tremor noted BUE, worse with movement    RANGE OF MOTION Cervical Spine AROM with mild to moderate loss but painless in all planes. No focal deficits in AROM noted in [place] strength WNL without focal deficits   TRANSFERS/GAIT Independent for transfers and ambulation without assistive device    PATIENT SURVEYS FOTO:  45,predicted improvement to 53  ABC: 78.1% DHI: 4/100   POSTURAL CONTROL TESTS  Clinical Test of Sensory Interaction for Balance (CTSIB): Deferred   OCULOMOTOR / VESTIBULAR TESTING  Oculomotor Exam- Room Light  Findings Comments  Ocular Alignment normal   Ocular ROM abnormal Decreased vertical gaze in both eyes  Spontaneous Nystagmus abnormal L beating pure horizontal nystagmus, does not fatigue  Gaze-Holding Nystagmus normal 2nd degree L beating pure horizontal nystagmus  End-Gaze Nystagmus normal See above  Vergence (normal 2-3") not examined   Smooth Pursuit normal   Cross-Cover Test not examined   Saccades abnormal Slow and hypometric  VOR Cancellation normal   Left Head Impulse not examined [name] unable to relax head/neck so unable to perform  Right Head Impulse not examined See above  Static Acuity not examined   Dynamic Acuity not examined     Oculomotor Exam- Fixation Suppressed  Findings Comments  Ocular Alignment normal   Spontaneous Nystagmus abnormal L beating pure horizontal nystagmus, does not fatigue  Gaze-Holding Nystagmus abnormal 2nd degree L beating pure horizontal nystagmus  End-Gaze Nystagmus abnormal See above  Head Shaking Nystagmus abnormal Slight increase in frequency of pure horizontal L beating nystagmus  Pressure-Induced Nystagmus not examined   Hyperventilation Induced Nystagmus not examined   Skull Vibration Induced Nystagmus not examined     BPPV TESTS:  Symptoms Duration Intensity Nystagmus  L Dix-Hallpike None   None  R Dix-Hallpike None   None  L Head Roll None   None  R Head Roll None   None  L Sidelying Test      R Sidelying Test      (blank = not tested)   FUNCTIONAL OUTCOME MEASURES: Deferred    TODAY'S TREATMENT   SUBJECTIVE: [name] reports that he is doing well today. He had a Meneire's attack last Thursday which lasted 2-3 hours and then resolved. Otherwise no specific changes since the last therapy session No specific  questions or concerns currently.    PAIN: Denies   Neuromuscular Re-education  NuStep L2-3 x 5 minutes for warm-up during interval history (4 minutes unbilled); VOR x 1 horizontal in standing 60s x 3 with target arms length away on plain wall, [name] requires heavy tactile cues to maintain speed of head turns; Gait in hallway with horizontal ball tosses to therapist with head/eye follow 2 x 70' toward each side; Airex balance with feet together eyes open/closed x 30s each; Airex balance with feet together with horizontal and vertical head turns x 30s each; Airex balance with feet together with ball passes around body with therapist returning ball on opposite side, head/eye follow, x multiple bouts toward each side; Dix-Hallpike negative bilaterally for vertigo and nystagmus on inverted mat table;  Roll Test negative bilaterally;    HOME EXERCISE PROGRAM Access Code: [email or web address] URL: [email or web address] Date: 11/29/2021 Prepared by: Roxana Hires  Exercises - Seated Gaze Stabilization with Head Rotation  - 2 x daily - 7 x weekly - 3 reps - 60 seconds hold - Single Leg Stance with Support  - 2 x daily - 7 x weekly - 2 x 30s on each leg hold   ASSESSMENT:  CLINICAL IMPRESSION: Patient demonstrates excellent motivation during session today. Continued gaze stabilization exercises and introduced dynamic habituation exercises such as ball tosses with head turns during gait. Also utilized unstable Airex pad to challenge balance. [name] encouraged to follow-up as scheduled. [name] will benefit from [name] services to address deficits in balance and dizziness in order to return to full function at home.   REHAB POTENTIAL: Fair    CLINICAL DECISION MAKING: Unstable/unpredictable  EVALUATION COMPLEXITY: High   GOALS: Goals reviewed with patient? No  SHORT TERM GOALS: Target date: 12/21/2021  [name] will be independent with HEP for dizziness in order to decrease symptoms, improve  balance,decrease fall risk, and improve function at home. Baseline: Goal status: INITIAL   LONG TERM GOALS: Target date: 8/Zachary/2023  [name] will increase FOTO to at least 53 to demonstrate significant improvement in function at home related to dizziness.  Baseline: 11/23/21: 45 Goal status: INITIAL  2.  [name] will report a 50% reduction in his vertigo episodes in order to demonstrate clinically significant reduction in disability related to dizziness.  Baseline:  Goal status: INITIAL  3. [name] will improve DGI by at least 3 points in order to demonstrate clinically significant improvement in balance and decreased risk for falls.     Baseline: 11/23/21: To be completed; 11/29/21: 21/24 Goal status: INITIAL   PLAN:  [name] FREQUENCY: 1x/week  [name] DURATION: 8 weeks  PLANNED INTERVENTIONS: Therapeutic exercises, Therapeutic activity, Neuromuscular re-education, Balance training, Gait training, Patient/Family education, Joint manipulation, Joint mobilization, Canalith repositioning, Aquatic Therapy, Dry Needling, Cognitive remediation, Electrical stimulation, Spinal manipulation, Spinal mobilization, Cryotherapy, Moist heat, Traction, Ultrasound, Ionotophoresis '4mg'$ /ml Dexamethasone, and Manual therapy  PLAN FOR NEXT SESSION: continue gaze stabilization progression, habituation exercises, balance exercises.   Zachary [name], Zachary [name], Zachary [name]  Zachary [name] 12/13/2021, 3:13 PM

## 2021-12-13 ENCOUNTER — Ambulatory Visit: Payer: Medicare Other | Attending: Family Medicine

## 2021-12-13 DIAGNOSIS — R42 Dizziness and giddiness: Secondary | ICD-10-CM | POA: Diagnosis not present

## 2021-12-20 ENCOUNTER — Ambulatory Visit: Payer: Medicare Other

## 2021-12-20 DIAGNOSIS — R42 Dizziness and giddiness: Secondary | ICD-10-CM

## 2021-12-20 NOTE — Therapy (Signed)
OUTPATIENT PHYSICAL THERAPY VESTIBULAR TREATMENT   Patient Name: Zachary [name] MRN: [id number] DOB:28-Jan-1941, 81 y.o., male Today's Date: 12/21/2021  PCP: Jerrol Banana., MD REFERRING PROVIDER: Jerrol Banana.,*   PT End of Session - 12/20/21 1410     Visit Number 4    Number of Visits 9    Date for PT Re-Evaluation 01/19/22    Authorization Type eval: 11/23/21    PT Start Time 1401    PT Stop Time 1445    PT Time Calculation (min) 44 min    Activity Tolerance Patient tolerated treatment well    Behavior During Therapy [facility] for tasks assessed/performed             Past Medical History:  Diagnosis Date   B12 deficiency anemia 08/03/2019   Hypercholesterolemia    Hypertension    Vertigo    last episode approx 09/2018   Past Surgical History:  Procedure Laterality Date   APPENDECTOMY     CATARACT EXTRACTION Bilateral    COLONOSCOPY WITH PROPOFOL N/A 12/24/2018   Procedure: COLONOSCOPY WITH PROPOFOL;  Surgeon: Lucilla Lame, MD;  Location: [facility] ENDOSCOPY;  Service: Endoscopy;  Laterality: N/A;   COLONOSCOPY WITH PROPOFOL N/A 12/13/2019   Procedure: COLONOSCOPY WITH BIOPSY ;  Surgeon: Lucilla Lame, MD;  Location: [place];  Service: Endoscopy;  Laterality: N/A;  priority 3   PLEURAL SCARIFICATION  05/2015   POLYPECTOMY N/A 12/13/2019   Procedure: POLYPECTOMY;  Surgeon: Lucilla Lame, MD;  Location: [place];  Service: Endoscopy;  Laterality: N/A;   Patient Active Problem List   Diagnosis Date Noted   Personal history of colonic polyps    B12 deficiency anemia 08/03/2019   Elevated MCV 08/03/2019   Cyst, baker's knee, right 07/27/2019   Peripheral edema- right lower leg  07/27/2019   Encounter for screening colonoscopy 04/12/2019   Polyp of transverse colon 04/12/2019   Coronary artery disease involving native coronary artery of native heart without angina pectoris 04/12/2019   Benign neoplasm of cecum 04/12/2019   Valvular heart  disease 04/12/2019   Benign neoplasm of cecum    Pneumothorax on right 05/15/2015   Benign essential tremor 02/28/2015   Mixed hyperlipidemia 02/28/2015   Essential hypertension 02/28/2015   Hypercholesterolemia without hypertriglyceridemia 02/28/2015   Vertigo 02/28/2015     PCP: Jerrol Banana., MD  REFERRING PROVIDER: Jerrol Banana.,*  REFERRING DIAGNOSIS: R42 (ICD-10-CM) - Vertigo   THERAPY DIAG: Dizziness and giddiness  RATIONALE FOR EVALUATION AND TREATMENT: Rehabilitation  ONSET DATE: 06/03/21 (approximate by patient however per medical record symptoms have been ongoing for years)  FOLLOW UP APPT WITH PROVIDER: Yes    SUBJECTIVE:   Chief Complaint:  Dizziness  Pertinent History Pt referred by PCP for dizziness. Per patient symptoms first started around January of this year when he started feeling "swimmy-headed." Based on the medical record from PCP it appearrs that these symptoms have been going on for multiple years. He was seen by Dr. Kathyrn Sheriff at [facility] and was diagnosed with Menire's Disease. Pt reports that he was prescribed Betahistine which does appar to have shortened the duration of the attacks. He reports approximately 8-10 Menire's attacks over the last 6 months. Pt states that the attacks can least anywhere from 3-4 hours to 2-3 days. Since the onset of symptoms pt has experienced R sided hearing loss and R sided tinnitus. He is not currently taking any diuretics. He reports having three ED visits as  well as three brain MRIs due to these symptoms. Most recent MRI report within our record is from 04/12/20 which showed no cerebellopontine angle or internal auditory canal lesion. Unremarkable appearance of the seventh and eighth cranial nerves bilaterally. No evidence of acute intracranial abnormality. Stable mild cerebral white matter chronic small vessel ischemic disease. Mild ethmoid and right maxillary sinus mucosal thickening. Small left  maxillary sinus mucous retention cyst. Pt has history of HTN, CAD, benign essential tremor, HLD and chronic RLL peripheral edema.  Description of dizziness: vertigo Frequency: 8-10 attacks over the last 6 motnhs Duration: 3-4 hours to multiple days Symptom nature: intermittent and spontaneous Progression of symptoms since onset: no change in severity but frequency has increased; History of similar episodes: No  Provocative Factors: Unknown Easing Factors: betahistine, wait for symptoms to pass  Auditory complaints (tinnitus, pain, drainage, hearing loss, aural fullness): Yes, R sided hearing loss and R tinntius, denies pain or aural fullness Vision changes (diplopia, visual field loss, recent changes, recent eye exam): No Chest pain/palpitations: No History of head injury/concussion: No Stress/anxiety: No Headaches/migraines: No history of migraines, pt reports bilateral headache during episodes, denies visual changes/floaters/photophobia/phonophbia Syncope: Denies syncope but does report some lightheadedness Numbness/tingling/focal weakness: No  Has patient fallen in last 6 months? Yes, Number of falls: 4, all related to vertigo episodes Pertinent pain: No Dominant hand: right Imaging: Yes  Prior level of function: Independent Occupational demands: Hobbies:  Red Flags: (dysarthria, dysphagia, drop attacks, bowel and bladder changes, recent weight loss/gain) Review of systems negative for red flags.   PRECAUTIONS: Fall  WEIGHT BEARING RESTRICTIONS No  LIVING ENVIRONMENT: Lives with: lives with their spouse Lives in: House/apartment Stairs: No;  Has following equipment at home: Environmental consultant - 4 wheeled, shower chair, and Grab bars  PATIENT GOALS  Decrease dizziness   OBJECTIVE  EXAMINATION  POSTURE: No gross deficits contributing to symptoms  NEUROLOGICAL SCREEN: (2+ unless otherwise noted.) N=normal  Ab=abnormal  Level Dermatome R L Myotome R L Reflex R L  C3 Anterior  Neck N N Sidebend C2-3 N N Jaw CN V    C4 Top of Shoulder N N Shoulder Shrug C4 N N Hoffman's UMN    C5 Lateral Upper Arm N N Shoulder ABD C4-5 N N Biceps C5-6    C6 Lateral Arm/ Thumb N N Arm Flex/ Wrist Ext C5-6 N N Brachiorad. C5-6    C7 Middle Finger N N Arm Ext//Wrist Flex C6-7 N N Triceps C7    C8 4th & 5th Finger N N Flex/ Ext Carpi Ulnaris C8 N N Patellar (L3-4)    T1 Medial Arm N N Interossei T1 N N Gastrocnemius    L2 Medial thigh/groin N N Illiopsoas (L2-3) N N     L3 Lower thigh/med.knee N N Quadriceps (L3-4) N N     L4 Medial leg/lat thigh N N Tibialis Ant (L4-5) N N     L5 Lat. leg & dorsal foot N N EHL (L5) N N     S1 post/lat foot/thigh/leg N N Gastrocnemius (S1-2) N N     S2 Post./med. thigh & leg N N Hamstrings (L4-S3) N N       CRANIAL NERVES II, III, IV, VI: Pupils equal and reactive to light, visual acuity and visual fields are intact, Pt with notable loss of vertical gaze bilaterally  but otherwise extraocular muscles are intact  V: Facial sensation is intact and symmetric bilaterally  VII: Facial strength is intact and symmetric bilaterally  VIII:  Hearing is normal as tested by gross conversation IX, X: Palate elevates midline, normal phonation, uvula midline XI: Shoulder shrug strength is intact  XII: Tongue protrudes midline    SOMATOSENSORY Deferred   COORDINATION Finger to Nose: Normal with the exception of BUE tremors Heel to Shin: Normal Pronator Drift: Negative Rapid Alternating Movements: Normal Finger to Thumb Opposition: Difficult for patient bilaterally secondary to tremor  Resting tremor noted BUE, worse with movement    RANGE OF MOTION Cervical Spine AROM with mild to moderate loss but painless in all planes. No focal deficits in AROM noted in [place] strength WNL without focal deficits   TRANSFERS/GAIT Independent for transfers and ambulation without assistive device    PATIENT SURVEYS FOTO:  45,predicted improvement to 53  ABC: 78.1% DHI: 4/100   POSTURAL CONTROL TESTS  Clinical Test of Sensory Interaction for Balance (CTSIB): Deferred   OCULOMOTOR / VESTIBULAR TESTING  Oculomotor Exam- Room Light  Findings Comments  Ocular Alignment normal   Ocular ROM abnormal Decreased vertical gaze in both eyes  Spontaneous Nystagmus abnormal L beating pure horizontal nystagmus, does not fatigue  Gaze-Holding Nystagmus normal 2nd degree L beating pure horizontal nystagmus  End-Gaze Nystagmus normal See above  Vergence (normal 2-3") not examined   Smooth Pursuit normal   Cross-Cover Test not examined   Saccades abnormal Slow and hypometric  VOR Cancellation normal   Left Head Impulse not examined Pt unable to relax head/neck so unable to perform  Right Head Impulse not examined See above  Static Acuity not examined   Dynamic Acuity not examined     Oculomotor Exam- Fixation Suppressed  Findings Comments  Ocular Alignment normal   Spontaneous Nystagmus abnormal L beating pure horizontal nystagmus, does not fatigue  Gaze-Holding Nystagmus abnormal 2nd degree L beating pure horizontal nystagmus  End-Gaze Nystagmus abnormal See above  Head Shaking Nystagmus abnormal Slight increase in frequency of pure horizontal L beating nystagmus  Pressure-Induced Nystagmus not examined   Hyperventilation Induced Nystagmus not examined   Skull Vibration Induced Nystagmus not examined     BPPV TESTS:  Symptoms Duration Intensity Nystagmus  L Dix-Hallpike None   None  R Dix-Hallpike None   None  L Head Roll None   None  R Head Roll None   None  L Sidelying Test      R Sidelying Test      (blank = not tested)   FUNCTIONAL OUTCOME MEASURES: Deferred    TODAY'S TREATMENT   SUBJECTIVE: Pt reports that he is doing well today. He has not had a Menire's attacks in the last 2 weeks. No specific changes since the last therapy session. No specific questions or concerns currently.     PAIN: Denies   Neuromuscular Re-education  NuStep L2-3 x 5 minutes for warm-up during interval history (4 minutes unbilled); VOR x 1 horizontal during forward ambulation in hallway 3 x 70', pt requires heavy tactile cues to maintain speed of head turns and still struggles to turn head fast enough; Gait in hallway with vertical ball lifts with head/eye follow 3 x 70' toward each side; Gait in hallway with horizontal ball passes between hands with head/eye follow 3 x 70' toward each side; Airex balance with feet together eyes open/closed x 30s each; Airex balance with feet together with horizontal and vertical head turns x 30s each; Airex balance with feet apart/together and balloon taps with second assist to challenge dynamic balance x 2 minutes in each  position; Tandem gait in // bars x multiple lengths; Airex balance beam side stepping in // bars x multiple lengths;   HOME EXERCISE PROGRAM Access Code: [email or web address] URL: [email or web address] Date: 11/29/2021 Prepared by: Roxana Hires  Exercises - Seated Gaze Stabilization with Head Rotation  - 2 x daily - 7 x weekly - 3 reps - 60 seconds hold - Single Leg Stance with Support  - 2 x daily - 7 x weekly - 2 x 30s on each leg hold   ASSESSMENT:  CLINICAL IMPRESSION: Patient demonstrates excellent motivation during session today. Continued gaze stabilization exercises and progressed to performing during gait in hallway. Pt still struggles to turn head fast enough to make exercise effective. Continued with dynamic habituation exercises such as ball passes with head turns during gait. Also utilized unstable Airex pad again to challenge balance. Continued to provide extensive education regarding Menire's Disease however pt continues to struggle understanding the proposed etiology. Pt encouraged to follow-up as scheduled. Pt will benefit from PT services to address deficits in balance and dizziness in order to return to full  function at home.   REHAB POTENTIAL: Fair    CLINICAL DECISION MAKING: Unstable/unpredictable  EVALUATION COMPLEXITY: High   GOALS: Goals reviewed with patient? No  SHORT TERM GOALS: Target date: 12/21/2021  Pt will be independent with HEP for dizziness in order to decrease symptoms, improve balance,decrease fall risk, and improve function at home. Baseline: Goal status: INITIAL   LONG TERM GOALS: Target date: 01/18/2022  Pt will increase FOTO to at least 53 to demonstrate significant improvement in function at home related to dizziness.  Baseline: 11/23/21: 45 Goal status: INITIAL  2.  Pt will report a 50% reduction in his vertigo episodes in order to demonstrate clinically significant reduction in disability related to dizziness.  Baseline:  Goal status: INITIAL  3. Pt will improve DGI by at least 3 points in order to demonstrate clinically significant improvement in balance and decreased risk for falls.     Baseline: 11/23/21: To be completed; 11/29/21: 21/24 Goal status: INITIAL   PLAN:  PT FREQUENCY: 1x/week  PT DURATION: 8 weeks  PLANNED INTERVENTIONS: Therapeutic exercises, Therapeutic activity, Neuromuscular re-education, Balance training, Gait training, Patient/Family education, Joint manipulation, Joint mobilization, Canalith repositioning, Aquatic Therapy, Dry Needling, Cognitive remediation, Electrical stimulation, Spinal manipulation, Spinal mobilization, Cryotherapy, Moist heat, Traction, Ultrasound, Ionotophoresis '4mg'$ /ml Dexamethasone, and Manual therapy  PLAN FOR NEXT SESSION: continue gaze stabilization progression, habituation exercises, balance exercises.   Lyndel Safe Thalia Turkington PT, DPT, GCS  Ruffus Kamaka 12/21/2021, 8:29 AM

## 2021-12-21 ENCOUNTER — Other Ambulatory Visit: Payer: Self-pay | Admitting: *Deleted

## 2021-12-21 DIAGNOSIS — H8109 Meniere's disease, unspecified ear: Secondary | ICD-10-CM

## 2021-12-21 MED ORDER — TRIAMTERENE-HCTZ 37.5-25 MG PO CAPS: ORAL_CAPSULE | ORAL | 1 refills | Status: DC

## 2021-12-27 ENCOUNTER — Ambulatory Visit: Payer: Medicare Other

## 2021-12-27 DIAGNOSIS — R42 Dizziness and giddiness: Secondary | ICD-10-CM

## 2021-12-27 NOTE — Therapy (Signed)
OUTPATIENT PHYSICAL THERAPY VESTIBULAR TREATMENT   Patient Name: SKYE RODARTE MRN: [id number] DOB:1941/04/10, 81 y.o., male Today's Date: 12/28/2021  PCP: Jerrol Banana., MD REFERRING PROVIDER: Jerrol Banana.,*   PT End of Session - 12/27/21 1407     Visit Number 5    Number of Visits 9    Date for PT Re-Evaluation 01/19/22    Authorization Type eval: 11/23/21    PT Start Time 1400    PT Stop Time 1445    PT Time Calculation (min) 45 min    Activity Tolerance Patient tolerated treatment well    Behavior During Therapy [facility] for tasks assessed/performed              Past Medical History:  Diagnosis Date   B12 deficiency anemia 08/03/2019   Hypercholesterolemia    Hypertension    Vertigo    last episode approx 09/2018   Past Surgical History:  Procedure Laterality Date   APPENDECTOMY     CATARACT EXTRACTION Bilateral    COLONOSCOPY WITH PROPOFOL N/A 12/24/2018   Procedure: COLONOSCOPY WITH PROPOFOL;  Surgeon: Lucilla Lame, MD;  Location: [facility] ENDOSCOPY;  Service: Endoscopy;  Laterality: N/A;   COLONOSCOPY WITH PROPOFOL N/A 12/13/2019   Procedure: COLONOSCOPY WITH BIOPSY ;  Surgeon: Lucilla Lame, MD;  Location: [place];  Service: Endoscopy;  Laterality: N/A;  priority 3   PLEURAL SCARIFICATION  05/2015   POLYPECTOMY N/A 12/13/2019   Procedure: POLYPECTOMY;  Surgeon: Lucilla Lame, MD;  Location: [place];  Service: Endoscopy;  Laterality: N/A;   Patient Active Problem List   Diagnosis Date Noted   Personal history of colonic polyps    B12 deficiency anemia 08/03/2019   Elevated MCV 08/03/2019   Cyst, baker's knee, right 07/27/2019   Peripheral edema- right lower leg  07/27/2019   Encounter for screening colonoscopy 04/12/2019   Polyp of transverse colon 04/12/2019   Coronary artery disease involving native coronary artery of native heart without angina pectoris 04/12/2019   Benign neoplasm of cecum 04/12/2019   Valvular  heart disease 04/12/2019   Benign neoplasm of cecum    Pneumothorax on right 05/15/2015   Benign essential tremor 02/28/2015   Mixed hyperlipidemia 02/28/2015   Essential hypertension 02/28/2015   Hypercholesterolemia without hypertriglyceridemia 02/28/2015   Vertigo 02/28/2015     PCP: Jerrol Banana., MD  REFERRING PROVIDER: Jerrol Banana.,*  REFERRING DIAGNOSIS: R42 (ICD-10-CM) - Vertigo   THERAPY DIAG: Dizziness and giddiness  RATIONALE FOR EVALUATION AND TREATMENT: Rehabilitation  ONSET DATE: 06/03/21 (approximate by patient however per medical record symptoms have been ongoing for years)  FOLLOW UP APPT WITH PROVIDER: Yes    SUBJECTIVE:   Chief Complaint:  Dizziness  Pertinent History Pt referred by PCP for dizziness. Per patient symptoms first started around January of this year when he started feeling "swimmy-headed." Based on the medical record from PCP it appearrs that these symptoms have been going on for multiple years. He was seen by Dr. Kathyrn Sheriff at [facility] and was diagnosed with Menire's Disease. Pt reports that he was prescribed Betahistine which does appar to have shortened the duration of the attacks. He reports approximately 8-10 Menire's attacks over the last 6 months. Pt states that the attacks can least anywhere from 3-4 hours to 2-3 days. Since the onset of symptoms pt has experienced R sided hearing loss and R sided tinnitus. He is not currently taking any diuretics. He reports having three ED visits  as well as three brain MRIs due to these symptoms. Most recent MRI report within our record is from 04/12/20 which showed no cerebellopontine angle or internal auditory canal lesion. Unremarkable appearance of the seventh and eighth cranial nerves bilaterally. No evidence of acute intracranial abnormality. Stable mild cerebral white matter chronic small vessel ischemic disease. Mild ethmoid and right maxillary sinus mucosal thickening. Small left  maxillary sinus mucous retention cyst. Pt has history of HTN, CAD, benign essential tremor, HLD and chronic RLL peripheral edema.  Description of dizziness: vertigo Frequency: 8-10 attacks over the last 6 motnhs Duration: 3-4 hours to multiple days Symptom nature: intermittent and spontaneous Progression of symptoms since onset: no change in severity but frequency has increased; History of similar episodes: No  Provocative Factors: Unknown Easing Factors: betahistine, wait for symptoms to pass  Auditory complaints (tinnitus, pain, drainage, hearing loss, aural fullness): Yes, R sided hearing loss and R tinntius, denies pain or aural fullness Vision changes (diplopia, visual field loss, recent changes, recent eye exam): No Chest pain/palpitations: No History of head injury/concussion: No Stress/anxiety: No Headaches/migraines: No history of migraines, pt reports bilateral headache during episodes, denies visual changes/floaters/photophobia/phonophbia Syncope: Denies syncope but does report some lightheadedness Numbness/tingling/focal weakness: No  Has patient fallen in last 6 months? Yes, Number of falls: 4, all related to vertigo episodes Pertinent pain: No Dominant hand: right Imaging: Yes  Prior level of function: Independent Occupational demands: Hobbies:  Red Flags: (dysarthria, dysphagia, drop attacks, bowel and bladder changes, recent weight loss/gain) Review of systems negative for red flags.   PRECAUTIONS: Fall  WEIGHT BEARING RESTRICTIONS No  LIVING ENVIRONMENT: Lives with: lives with their spouse Lives in: House/apartment Stairs: No;  Has following equipment at home: Environmental consultant - 4 wheeled, shower chair, and Grab bars  PATIENT GOALS  Decrease dizziness   OBJECTIVE  EXAMINATION  POSTURE: No gross deficits contributing to symptoms  NEUROLOGICAL SCREEN: (2+ unless otherwise noted.) N=normal  Ab=abnormal  Level Dermatome R L Myotome R L Reflex R L  C3 Anterior  Neck N N Sidebend C2-3 N N Jaw CN V    C4 Top of Shoulder N N Shoulder Shrug C4 N N Hoffman's UMN    C5 Lateral Upper Arm N N Shoulder ABD C4-5 N N Biceps C5-6    C6 Lateral Arm/ Thumb N N Arm Flex/ Wrist Ext C5-6 N N Brachiorad. C5-6    C7 Middle Finger N N Arm Ext//Wrist Flex C6-7 N N Triceps C7    C8 4th & 5th Finger N N Flex/ Ext Carpi Ulnaris C8 N N Patellar (L3-4)    T1 Medial Arm N N Interossei T1 N N Gastrocnemius    L2 Medial thigh/groin N N Illiopsoas (L2-3) N N     L3 Lower thigh/med.knee N N Quadriceps (L3-4) N N     L4 Medial leg/lat thigh N N Tibialis Ant (L4-5) N N     L5 Lat. leg & dorsal foot N N EHL (L5) N N     S1 post/lat foot/thigh/leg N N Gastrocnemius (S1-2) N N     S2 Post./med. thigh & leg N N Hamstrings (L4-S3) N N       CRANIAL NERVES II, III, IV, VI: Pupils equal and reactive to light, visual acuity and visual fields are intact, Pt with notable loss of vertical gaze bilaterally  but otherwise extraocular muscles are intact  V: Facial sensation is intact and symmetric bilaterally  VII: Facial strength is intact and symmetric bilaterally  VIII: Hearing is normal as tested by gross conversation IX, X: Palate elevates midline, normal phonation, uvula midline XI: Shoulder shrug strength is intact  XII: Tongue protrudes midline    SOMATOSENSORY Deferred   COORDINATION Finger to Nose: Normal with the exception of BUE tremors Heel to Shin: Normal Pronator Drift: Negative Rapid Alternating Movements: Normal Finger to Thumb Opposition: Difficult for patient bilaterally secondary to tremor  Resting tremor noted BUE, worse with movement    RANGE OF MOTION Cervical Spine AROM with mild to moderate loss but painless in all planes. No focal deficits in AROM noted in [place] strength WNL without focal deficits   TRANSFERS/GAIT Independent for transfers and ambulation without assistive device    PATIENT SURVEYS FOTO:  45,predicted improvement to 53  ABC: 78.1% DHI: 4/100   POSTURAL CONTROL TESTS  Clinical Test of Sensory Interaction for Balance (CTSIB): Deferred   OCULOMOTOR / VESTIBULAR TESTING  Oculomotor Exam- Room Light  Findings Comments  Ocular Alignment normal   Ocular ROM abnormal Decreased vertical gaze in both eyes  Spontaneous Nystagmus abnormal L beating pure horizontal nystagmus, does not fatigue  Gaze-Holding Nystagmus normal 2nd degree L beating pure horizontal nystagmus  End-Gaze Nystagmus normal See above  Vergence (normal 2-3") not examined   Smooth Pursuit normal   Cross-Cover Test not examined   Saccades abnormal Slow and hypometric  VOR Cancellation normal   Left Head Impulse not examined Pt unable to relax head/neck so unable to perform  Right Head Impulse not examined See above  Static Acuity not examined   Dynamic Acuity not examined     Oculomotor Exam- Fixation Suppressed  Findings Comments  Ocular Alignment normal   Spontaneous Nystagmus abnormal L beating pure horizontal nystagmus, does not fatigue  Gaze-Holding Nystagmus abnormal 2nd degree L beating pure horizontal nystagmus  End-Gaze Nystagmus abnormal See above  Head Shaking Nystagmus abnormal Slight increase in frequency of pure horizontal L beating nystagmus  Pressure-Induced Nystagmus not examined   Hyperventilation Induced Nystagmus not examined   Skull Vibration Induced Nystagmus not examined     BPPV TESTS:  Symptoms Duration Intensity Nystagmus  L Dix-Hallpike None   None  R Dix-Hallpike None   None  L Head Roll None   None  R Head Roll None   None  L Sidelying Test      R Sidelying Test      (blank = not tested)   FUNCTIONAL OUTCOME MEASURES: Deferred    TODAY'S TREATMENT   SUBJECTIVE: Pt reports that he is doing well today. He filled the dyazide and took it Tuesday morning. Shortly after he started to have a headache and subsequently when he was out at lunch he had a Meneire's  attack and fell. He reports that they had to call the rescue squad to help him up and to get back to the car. He did not injure himself in the fall. He stayed in bed the rest of the day Tuesday and yesterday but feels fine today.   PAIN: Denies   Neuromuscular Re-education  NuStep L2 x 5 minutes for warm-up during interval history (4 minutes unbilled); VOR x 1 horizontal during forward ambulation in hallway 2 x 70', pt requires heavy tactile cues to maintain speed of head turns and still struggles to turn head fast enough; Gait in hallway with vertical ball lifts with head/eye follow 2 x 70' toward each side; Gait in hallway with horizontal ball passes between hands with head/eye  follow 2 x 70' toward each side; Gait in hallway with horizontal ball to therapist with head/eye follow 2 x 70' toward each side; Airex feet together with ball passes around body with therapist providing return pass on opposite side varying height x multiple bouts on each side; Tandem gait in // bars x multiple lengths; Airex alternating 6" cone taps without UE support x 10 BLE; 6" step-ups from Airex pad to Airex pad on top of step x 10 leading with each LE; Airex balance beam side stepping in // bars x multiple lengths; Airex balance beam side stepping with horizontal head turns x multiple lengths;   HOME EXERCISE PROGRAM Access Code: [email or web address] URL: [email or web address] Date: 11/29/2021 Prepared by: Roxana Hires  Exercises - Seated Gaze Stabilization with Head Rotation  - 2 x daily - 7 x weekly - 3 reps - 60 seconds hold - Single Leg Stance with Support  - 2 x daily - 7 x weekly - 2 x 30s on each leg hold   ASSESSMENT:  CLINICAL IMPRESSION: Patient demonstrates excellent motivation during session today. Continued gaze stabilization during gait. Pt still struggles to turn head fast enough to make exercise effective. Continued with dynamic habituation exercises such as ball passes with head  turns during gait. Also utilized unstable Airex pad again to challenge balance. Continued to provide extensive education regarding Menire's Disease however pt continues to express confusion regarding details. Pt encouraged to follow-up as scheduled. Pt will benefit from PT services to address deficits in balance and dizziness in order to return to full function at home.   REHAB POTENTIAL: Fair    CLINICAL DECISION MAKING: Unstable/unpredictable  EVALUATION COMPLEXITY: High   GOALS: Goals reviewed with patient? No  SHORT TERM GOALS: Target date: 12/21/2021  Pt will be independent with HEP for dizziness in order to decrease symptoms, improve balance,decrease fall risk, and improve function at home. Baseline: Goal status: INITIAL   LONG TERM GOALS: Target date: 01/18/2022  Pt will increase FOTO to at least 53 to demonstrate significant improvement in function at home related to dizziness.  Baseline: 11/23/21: 45 Goal status: INITIAL  2.  Pt will report a 50% reduction in his vertigo episodes in order to demonstrate clinically significant reduction in disability related to dizziness.  Baseline:  Goal status: INITIAL  3. Pt will improve DGI by at least 3 points in order to demonstrate clinically significant improvement in balance and decreased risk for falls.     Baseline: 11/23/21: To be completed; 11/29/21: 21/24 Goal status: INITIAL   PLAN:  PT FREQUENCY: 1x/week  PT DURATION: 8 weeks  PLANNED INTERVENTIONS: Therapeutic exercises, Therapeutic activity, Neuromuscular re-education, Balance training, Gait training, Patient/Family education, Joint manipulation, Joint mobilization, Canalith repositioning, Aquatic Therapy, Dry Needling, Cognitive remediation, Electrical stimulation, Spinal manipulation, Spinal mobilization, Cryotherapy, Moist heat, Traction, Ultrasound, Ionotophoresis '4mg'$ /ml Dexamethasone, and Manual therapy  PLAN FOR NEXT SESSION: continue gaze stabilization  progression, habituation exercises, balance exercises.   Lyndel Safe Jakeline Dave PT, DPT, GCS  Jacqulin Brandenburger 12/28/2021, 9:54 AM

## 2022-01-03 ENCOUNTER — Ambulatory Visit: Payer: Medicare Other | Attending: Family Medicine

## 2022-01-03 DIAGNOSIS — R42 Dizziness and giddiness: Secondary | ICD-10-CM | POA: Diagnosis not present

## 2022-01-03 NOTE — Therapy (Signed)
OUTPATIENT PHYSICAL THERAPY VESTIBULAR TREATMENT/GOAL UPDATE   Patient Name: CORBETT MOULDER MRN: [id number] DOB:[birth year], 81 y.o., male Today's Date: 01/04/2022  PCP: Jerrol Banana., MD REFERRING PROVIDER: Jerrol Banana.,*   PT End of Session - 01/03/22 1402     Visit Number 6    Number of Visits 9    Date for PT Re-Evaluation 01/19/22    Authorization Type eval: 11/23/21    PT Start Time 1400    PT Stop Time 1445    PT Time Calculation (min) 45 min    Activity Tolerance Patient tolerated treatment well    Behavior During Therapy [facility] for tasks assessed/performed               Past Medical History:  Diagnosis Date   B12 deficiency anemia 08/03/2019   Hypercholesterolemia    Hypertension    Vertigo    last episode approx 09/2018   Past Surgical History:  Procedure Laterality Date   APPENDECTOMY     CATARACT EXTRACTION Bilateral    COLONOSCOPY WITH PROPOFOL N/A 12/24/2018   Procedure: COLONOSCOPY WITH PROPOFOL;  Surgeon: Lucilla Lame, MD;  Location: [facility] ENDOSCOPY;  Service: Endoscopy;  Laterality: N/A;   COLONOSCOPY WITH PROPOFOL N/A 12/13/2019   Procedure: COLONOSCOPY WITH BIOPSY ;  Surgeon: Lucilla Lame, MD;  Location: [place];  Service: Endoscopy;  Laterality: N/A;  priority 3   PLEURAL SCARIFICATION  05/2015   POLYPECTOMY N/A 12/13/2019   Procedure: POLYPECTOMY;  Surgeon: Lucilla Lame, MD;  Location: [place];  Service: Endoscopy;  Laterality: N/A;   Patient Active Problem List   Diagnosis Date Noted   Personal history of colonic polyps    B12 deficiency anemia 08/03/2019   Elevated MCV 08/03/2019   Cyst, baker's knee, right 07/27/2019   Peripheral edema- right lower leg  07/27/2019   Encounter for screening colonoscopy 04/12/2019   Polyp of transverse colon 04/12/2019   Coronary artery disease involving native coronary artery of native heart without angina pectoris 04/12/2019   Benign neoplasm of cecum 04/12/2019    Valvular heart disease 04/12/2019   Benign neoplasm of cecum    Pneumothorax on right 05/15/2015   Benign essential tremor 02/28/2015   Mixed hyperlipidemia 02/28/2015   Essential hypertension 02/28/2015   Hypercholesterolemia without hypertriglyceridemia 02/28/2015   Vertigo 02/28/2015     PCP: Jerrol Banana., MD  REFERRING PROVIDER: Jerrol Banana.,*  REFERRING DIAGNOSIS: R42 (ICD-10-CM) - Vertigo   THERAPY DIAG: Dizziness and giddiness  RATIONALE FOR EVALUATION AND TREATMENT: Rehabilitation  ONSET DATE: 06/03/21 (approximate by patient however per medical record symptoms have been ongoing for years)  FOLLOW UP APPT WITH PROVIDER: Yes    SUBJECTIVE:   Chief Complaint:  Dizziness  Pertinent History Pt referred by PCP for dizziness. Per patient symptoms first started around January of this year when he started feeling "swimmy-headed." Based on the medical record from PCP it appearrs that these symptoms have been going on for multiple years. He was seen by Dr. Kathyrn Sheriff at [facility] and was diagnosed with Menire's Disease. Pt reports that he was prescribed Betahistine which does appar to have shortened the duration of the attacks. He reports approximately 8-10 Menire's attacks over the last 6 months. Pt states that the attacks can least anywhere from 3-4 hours to 2-3 days. Since the onset of symptoms pt has experienced R sided hearing loss and R sided tinnitus. He is not currently taking any diuretics. He reports having three  ED visits as well as three brain MRIs due to these symptoms. Most recent MRI report within our record is from 04/12/20 which showed no cerebellopontine angle or internal auditory canal lesion. Unremarkable appearance of the seventh and eighth cranial nerves bilaterally. No evidence of acute intracranial abnormality. Stable mild cerebral white matter chronic small vessel ischemic disease. Mild ethmoid and right maxillary sinus mucosal thickening.  Small left maxillary sinus mucous retention cyst. Pt has history of HTN, CAD, benign essential tremor, HLD and chronic RLL peripheral edema.  Description of dizziness: vertigo Frequency: 8-10 attacks over the last 6 motnhs Duration: 3-4 hours to multiple days Symptom nature: intermittent and spontaneous Progression of symptoms since onset: no change in severity but frequency has increased; History of similar episodes: No  Provocative Factors: Unknown Easing Factors: betahistine, wait for symptoms to pass  Auditory complaints (tinnitus, pain, drainage, hearing loss, aural fullness): Yes, R sided hearing loss and R tinntius, denies pain or aural fullness Vision changes (diplopia, visual field loss, recent changes, recent eye exam): No Chest pain/palpitations: No History of head injury/concussion: No Stress/anxiety: No Headaches/migraines: No history of migraines, pt reports bilateral headache during episodes, denies visual changes/floaters/photophobia/phonophbia Syncope: Denies syncope but does report some lightheadedness Numbness/tingling/focal weakness: No  Has patient fallen in last 6 months? Yes, Number of falls: 4, all related to vertigo episodes Pertinent pain: No Dominant hand: right Imaging: Yes  Prior level of function: Independent Occupational demands: Hobbies:  Red Flags: (dysarthria, dysphagia, drop attacks, bowel and bladder changes, recent weight loss/gain) Review of systems negative for red flags.   PRECAUTIONS: Fall  WEIGHT BEARING RESTRICTIONS No  LIVING ENVIRONMENT: Lives with: lives with their spouse Lives in: House/apartment Stairs: No;  Has following equipment at home: Environmental consultant - 4 wheeled, shower chair, and Grab bars  PATIENT GOALS  Decrease dizziness   OBJECTIVE  EXAMINATION  POSTURE: No gross deficits contributing to symptoms  NEUROLOGICAL SCREEN: (2+ unless otherwise noted.) N=normal  Ab=abnormal  Level Dermatome R L Myotome R L Reflex R L  C3  Anterior Neck N N Sidebend C2-3 N N Jaw CN V    C4 Top of Shoulder N N Shoulder Shrug C4 N N Hoffman's UMN    C5 Lateral Upper Arm N N Shoulder ABD C4-5 N N Biceps C5-6    C6 Lateral Arm/ Thumb N N Arm Flex/ Wrist Ext C5-6 N N Brachiorad. C5-6    C7 Middle Finger N N Arm Ext//Wrist Flex C6-7 N N Triceps C7    C8 4th & 5th Finger N N Flex/ Ext Carpi Ulnaris C8 N N Patellar (L3-4)    T1 Medial Arm N N Interossei T1 N N Gastrocnemius    L2 Medial thigh/groin N N Illiopsoas (L2-3) N N     L3 Lower thigh/med.knee N N Quadriceps (L3-4) N N     L4 Medial leg/lat thigh N N Tibialis Ant (L4-5) N N     L5 Lat. leg & dorsal foot N N EHL (L5) N N     S1 post/lat foot/thigh/leg N N Gastrocnemius (S1-2) N N     S2 Post./med. thigh & leg N N Hamstrings (L4-S3) N N       CRANIAL NERVES II, III, IV, VI: Pupils equal and reactive to light, visual acuity and visual fields are intact, Pt with notable loss of vertical gaze bilaterally  but otherwise extraocular muscles are intact  V: Facial sensation is intact and symmetric bilaterally  VII: Facial strength is intact and symmetric  bilaterally  VIII: Hearing is normal as tested by gross conversation IX, X: Palate elevates midline, normal phonation, uvula midline XI: Shoulder shrug strength is intact  XII: Tongue protrudes midline    SOMATOSENSORY Deferred   COORDINATION Finger to Nose: Normal with the exception of BUE tremors Heel to Shin: Normal Pronator Drift: Negative Rapid Alternating Movements: Normal Finger to Thumb Opposition: Difficult for patient bilaterally secondary to tremor  Resting tremor noted BUE, worse with movement    RANGE OF MOTION Cervical Spine AROM with mild to moderate loss but painless in all planes. No focal deficits in AROM noted in [place] strength WNL without focal deficits   TRANSFERS/GAIT Independent for transfers and ambulation without assistive device    PATIENT  SURVEYS FOTO: 45,predicted improvement to 53  ABC: 78.1% DHI: 4/100   POSTURAL CONTROL TESTS  Clinical Test of Sensory Interaction for Balance (CTSIB): Deferred   OCULOMOTOR / VESTIBULAR TESTING  Oculomotor Exam- Room Light  Findings Comments  Ocular Alignment normal   Ocular ROM abnormal Decreased vertical gaze in both eyes  Spontaneous Nystagmus abnormal L beating pure horizontal nystagmus, does not fatigue  Gaze-Holding Nystagmus normal 2nd degree L beating pure horizontal nystagmus  End-Gaze Nystagmus normal See above  Vergence (normal 2-3") not examined   Smooth Pursuit normal   Cross-Cover Test not examined   Saccades abnormal Slow and hypometric  VOR Cancellation normal   Left Head Impulse not examined Pt unable to relax head/neck so unable to perform  Right Head Impulse not examined See above  Static Acuity not examined   Dynamic Acuity not examined     Oculomotor Exam- Fixation Suppressed  Findings Comments  Ocular Alignment normal   Spontaneous Nystagmus abnormal L beating pure horizontal nystagmus, does not fatigue  Gaze-Holding Nystagmus abnormal 2nd degree L beating pure horizontal nystagmus  End-Gaze Nystagmus abnormal See above  Head Shaking Nystagmus abnormal Slight increase in frequency of pure horizontal L beating nystagmus  Pressure-Induced Nystagmus not examined   Hyperventilation Induced Nystagmus not examined   Skull Vibration Induced Nystagmus not examined     BPPV TESTS:  Symptoms Duration Intensity Nystagmus  L Dix-Hallpike None   None  R Dix-Hallpike None   None  L Head Roll None   None  R Head Roll None   None  L Sidelying Test      R Sidelying Test      (blank = not tested)   FUNCTIONAL OUTCOME MEASURES: Deferred    TODAY'S TREATMENT   SUBJECTIVE: Pt reports that he is doing alright today but is feeling slightly tired. He arrives with a bandage wrapping on his R forearm. He had an attack on Friday which resulted in a fall. Pt  reports that he had a skin tear on his R forearm and struck his head. He has a small scab on his R frontal scalp. He has not talked to his MD since the fall. He denies any new or worsening dizziness since the fall. Denies any focal weakness, N/T, changes in vision, slurred speech, facial droop, or new neurologic symptoms since the fall. He has not been taking the Dyazide.   PAIN: Denies   Neuromuscular Re-education  NuStep L3-4 x 5 minutes for warm-up during interval history (4 minutes unbilled); Pre-vitals: BP: 144/88 mmHg, HR: 60 bpm, SpO2%: 100%  Forward/retro gait in hallway with vertical ball lifts with head/eye follow 2 x 70' both directions; Forward/retro gait in hallway with horizontal ball passes between  hands with head/eye follow 2 x 70' toward each side both directions; Airex feet apart forward bending with cone pick up and turn to pass to therapist with feet apart/together x multiple bouts in each position towards both directions; Airex balance beam side stepping in // bars x multiple lengths; Airex balance beam side stepping with horizontal head turns x multiple lengths; Updated outcome measures with patient (see goal section)   Not performed: VOR x 1 horizontal during forward ambulation in hallway 2 x 70', pt requires heavy tactile cues to maintain speed of head turns and still struggles to turn head fast enough; Gait in hallway with horizontal ball passes to therapist with head/eye follow 2 x 70' toward each side; Airex feet together with ball passes around body with therapist providing return pass on opposite side varying height x multiple bouts on each side; Airex alternating 6" cone taps without UE support x 10 BLE; 6" step-ups from Airex pad to Airex pad on top of step x 10 leading with each LE; Tandem gait in // bars x multiple lengths;   HOME EXERCISE PROGRAM Access Code: [email or web address] URL: [email or web address] Date: 11/29/2021 Prepared by: Roxana Hires  Exercises - Seated Gaze Stabilization with Head Rotation  - 2 x daily - 7 x weekly - 3 reps - 60 seconds hold - Single Leg Stance with Support  - 2 x daily - 7 x weekly - 2 x 30s on each leg hold   ASSESSMENT:  CLINICAL IMPRESSION: Patient demonstrates excellent motivation during session today. Updated outcome measures with patient during appointment. His DGI is unchanged however his FOTO score improved from 45 at initial evaluation to 48 today. Overall he reports approximately 30-40% improvement in the frequency of vertigo episodes since starting with therapy. After updating goals continued with dynamic habituation exercises such as ball passes with head turns during gait. Also utilized unstable Airex pad again to challenge balance. Given improvement since starting with therapy episode of care extended. Pt encouraged to follow-up as scheduled. Pt will benefit from PT services to address deficits in balance and dizziness in order to return to full function at home.   REHAB POTENTIAL: Fair    CLINICAL DECISION MAKING: Unstable/unpredictable  EVALUATION COMPLEXITY: High   GOALS: Goals reviewed with patient? No  SHORT TERM GOALS: Target date: 12/21/2021  Pt will be independent with HEP for dizziness in order to decrease symptoms, improve balance,decrease fall risk, and improve function at home. Baseline: Goal status: INITIAL   LONG TERM GOALS: Target date: 01/18/2022  Pt will increase FOTO to at least 53 to demonstrate significant improvement in function at home related to dizziness.  Baseline: 11/23/21: 45; 01/03/22: 48; Goal status: INITIAL  2.  Pt will report a 50% reduction in his vertigo episodes in order to demonstrate clinically significant reduction in disability related to dizziness.  Baseline: 01/03/22: 30-40% improvement in frequency of vertigo episodes; Goal status: INITIAL  3. Pt will improve DGI by at least 3 points in order to demonstrate clinically significant  improvement in balance and decreased risk for falls.     Baseline: 11/23/21: To be completed; 11/29/21: 21/24; 01/03/22: 21/24; Goal status: INITIAL   PLAN:  PT FREQUENCY: 1x/week  PT DURATION: 8 weeks  PLANNED INTERVENTIONS: Therapeutic exercises, Therapeutic activity, Neuromuscular re-education, Balance training, Gait training, Patient/Family education, Joint manipulation, Joint mobilization, Canalith repositioning, Aquatic Therapy, Dry Needling, Cognitive remediation, Electrical stimulation, Spinal manipulation, Spinal mobilization, Cryotherapy, Moist heat, Traction, Ultrasound, Ionotophoresis '4mg'$ /ml Dexamethasone, and Manual therapy  PLAN FOR  NEXT SESSION: continue gaze stabilization progression, habituation exercises, balance exercises.   Lyndel Safe Stefana Lodico PT, DPT, GCS  Darnel Mchan 01/04/2022, 3:47 PM

## 2022-01-09 NOTE — Therapy (Signed)
OUTPATIENT PHYSICAL THERAPY VESTIBULAR TREATMENT   Patient Name: GERALD KUEHL MRN: [id number] DOB:[birth year], 81 y.o., male Today's Date: 01/11/2022  PCP: Jerrol Banana., MD REFERRING PROVIDER: Jerrol Banana.,*   PT End of Session - 01/10/22 1612     Visit Number 7    Number of Visits 9    Date for PT Re-Evaluation 01/19/22    Authorization Type eval: 11/23/21    PT Start Time 1615    PT Stop Time 1700    PT Time Calculation (min) 45 min    Equipment Utilized During Treatment Gait belt    Activity Tolerance Patient tolerated treatment well    Behavior During Therapy [facility] for tasks assessed/performed                Past Medical History:  Diagnosis Date   B12 deficiency anemia 08/03/2019   Hypercholesterolemia    Hypertension    Vertigo    last episode approx 09/2018   Past Surgical History:  Procedure Laterality Date   APPENDECTOMY     CATARACT EXTRACTION Bilateral    COLONOSCOPY WITH PROPOFOL N/A 12/24/2018   Procedure: COLONOSCOPY WITH PROPOFOL;  Surgeon: Lucilla Lame, MD;  Location: [facility] ENDOSCOPY;  Service: Endoscopy;  Laterality: N/A;   COLONOSCOPY WITH PROPOFOL N/A 12/13/2019   Procedure: COLONOSCOPY WITH BIOPSY ;  Surgeon: Lucilla Lame, MD;  Location: [place];  Service: Endoscopy;  Laterality: N/A;  priority 3   PLEURAL SCARIFICATION  05/2015   POLYPECTOMY N/A 12/13/2019   Procedure: POLYPECTOMY;  Surgeon: Lucilla Lame, MD;  Location: [place];  Service: Endoscopy;  Laterality: N/A;   Patient Active Problem List   Diagnosis Date Noted   Personal history of colonic polyps    B12 deficiency anemia 08/03/2019   Elevated MCV 08/03/2019   Cyst, baker's knee, right 07/27/2019   Peripheral edema- right lower leg  07/27/2019   Encounter for screening colonoscopy 04/12/2019   Polyp of transverse colon 04/12/2019   Coronary artery disease involving native coronary artery of native heart without angina pectoris 04/12/2019    Benign neoplasm of cecum 04/12/2019   Valvular heart disease 04/12/2019   Benign neoplasm of cecum    Pneumothorax on right 05/15/2015   Benign essential tremor 02/28/2015   Mixed hyperlipidemia 02/28/2015   Essential hypertension 02/28/2015   Hypercholesterolemia without hypertriglyceridemia 02/28/2015   Vertigo 02/28/2015     PCP: Jerrol Banana., MD  REFERRING PROVIDER: Jerrol Banana.,*  REFERRING DIAGNOSIS: R42 (ICD-10-CM) - Vertigo   THERAPY DIAG: Dizziness and giddiness  RATIONALE FOR EVALUATION AND TREATMENT: Rehabilitation  ONSET DATE: 06/03/21 (approximate by patient however per medical record symptoms have been ongoing for years)  FOLLOW UP APPT WITH PROVIDER: Yes    SUBJECTIVE:   Chief Complaint:  Dizziness  Pertinent History Pt referred by PCP for dizziness. Per patient symptoms first started around January of this year when he started feeling "swimmy-headed." Based on the medical record from PCP it appearrs that these symptoms have been going on for multiple years. He was seen by Dr. Kathyrn Sheriff at [facility] and was diagnosed with Menire's Disease. Pt reports that he was prescribed Betahistine which does appar to have shortened the duration of the attacks. He reports approximately 8-10 Menire's attacks over the last 6 months. Pt states that the attacks can least anywhere from 3-4 hours to 2-3 days. Since the onset of symptoms pt has experienced R sided hearing loss and R sided tinnitus. He is  not currently taking any diuretics. He reports having three ED visits as well as three brain MRIs due to these symptoms. Most recent MRI report within our record is from 04/12/20 which showed no cerebellopontine angle or internal auditory canal lesion. Unremarkable appearance of the seventh and eighth cranial nerves bilaterally. No evidence of acute intracranial abnormality. Stable mild cerebral white matter chronic small vessel ischemic disease. Mild ethmoid and  right maxillary sinus mucosal thickening. Small left maxillary sinus mucous retention cyst. Pt has history of HTN, CAD, benign essential tremor, HLD and chronic RLL peripheral edema.  Description of dizziness: vertigo Frequency: 8-10 attacks over the last 6 motnhs Duration: 3-4 hours to multiple days Symptom nature: intermittent and spontaneous Progression of symptoms since onset: no change in severity but frequency has increased; History of similar episodes: No  Provocative Factors: Unknown Easing Factors: betahistine, wait for symptoms to pass  Auditory complaints (tinnitus, pain, drainage, hearing loss, aural fullness): Yes, R sided hearing loss and R tinntius, denies pain or aural fullness Vision changes (diplopia, visual field loss, recent changes, recent eye exam): No Chest pain/palpitations: No History of head injury/concussion: No Stress/anxiety: No Headaches/migraines: No history of migraines, pt reports bilateral headache during episodes, denies visual changes/floaters/photophobia/phonophbia Syncope: Denies syncope but does report some lightheadedness Numbness/tingling/focal weakness: No  Has patient fallen in last 6 months? Yes, Number of falls: 4, all related to vertigo episodes Pertinent pain: No Dominant hand: right Imaging: Yes  Prior level of function: Independent Occupational demands: Hobbies:  Red Flags: (dysarthria, dysphagia, drop attacks, bowel and bladder changes, recent weight loss/gain) Review of systems negative for red flags.   PRECAUTIONS: Fall  WEIGHT BEARING RESTRICTIONS No  LIVING ENVIRONMENT: Lives with: lives with their spouse Lives in: House/apartment Stairs: No;  Has following equipment at home: Environmental consultant - 4 wheeled, shower chair, and Grab bars  PATIENT GOALS  Decrease dizziness   OBJECTIVE  EXAMINATION  POSTURE: No gross deficits contributing to symptoms  NEUROLOGICAL SCREEN: (2+ unless otherwise noted.) N=normal  Ab=abnormal  Level  Dermatome R L Myotome R L Reflex R L  C3 Anterior Neck N N Sidebend C2-3 N N Jaw CN V    C4 Top of Shoulder N N Shoulder Shrug C4 N N Hoffman's UMN    C5 Lateral Upper Arm N N Shoulder ABD C4-5 N N Biceps C5-6    C6 Lateral Arm/ Thumb N N Arm Flex/ Wrist Ext C5-6 N N Brachiorad. C5-6    C7 Middle Finger N N Arm Ext//Wrist Flex C6-7 N N Triceps C7    C8 4th & 5th Finger N N Flex/ Ext Carpi Ulnaris C8 N N Patellar (L3-4)    T1 Medial Arm N N Interossei T1 N N Gastrocnemius    L2 Medial thigh/groin N N Illiopsoas (L2-3) N N     L3 Lower thigh/med.knee N N Quadriceps (L3-4) N N     L4 Medial leg/lat thigh N N Tibialis Ant (L4-5) N N     L5 Lat. leg & dorsal foot N N EHL (L5) N N     S1 post/lat foot/thigh/leg N N Gastrocnemius (S1-2) N N     S2 Post./med. thigh & leg N N Hamstrings (L4-S3) N N       CRANIAL NERVES II, III, IV, VI: Pupils equal and reactive to light, visual acuity and visual fields are intact, Pt with notable loss of vertical gaze bilaterally  but otherwise extraocular muscles are intact  V: Facial sensation is intact and symmetric  bilaterally  VII: Facial strength is intact and symmetric bilaterally  VIII: Hearing is normal as tested by gross conversation IX, X: Palate elevates midline, normal phonation, uvula midline XI: Shoulder shrug strength is intact  XII: Tongue protrudes midline    SOMATOSENSORY Deferred   COORDINATION Finger to Nose: Normal with the exception of BUE tremors Heel to Shin: Normal Pronator Drift: Negative Rapid Alternating Movements: Normal Finger to Thumb Opposition: Difficult for patient bilaterally secondary to tremor  Resting tremor noted BUE, worse with movement    RANGE OF MOTION Cervical Spine AROM with mild to moderate loss but painless in all planes. No focal deficits in AROM noted in [place] strength WNL without focal deficits   TRANSFERS/GAIT Independent for transfers and ambulation without  assistive device    PATIENT SURVEYS FOTO: 45,predicted improvement to 53  ABC: 78.1% DHI: 4/100   POSTURAL CONTROL TESTS  Clinical Test of Sensory Interaction for Balance (CTSIB): Deferred   OCULOMOTOR / VESTIBULAR TESTING  Oculomotor Exam- Room Light  Findings Comments  Ocular Alignment normal   Ocular ROM abnormal Decreased vertical gaze in both eyes  Spontaneous Nystagmus abnormal L beating pure horizontal nystagmus, does not fatigue  Gaze-Holding Nystagmus normal 2nd degree L beating pure horizontal nystagmus  End-Gaze Nystagmus normal See above  Vergence (normal 2-3") not examined   Smooth Pursuit normal   Cross-Cover Test not examined   Saccades abnormal Slow and hypometric  VOR Cancellation normal   Left Head Impulse not examined Pt unable to relax head/neck so unable to perform  Right Head Impulse not examined See above  Static Acuity not examined   Dynamic Acuity not examined     Oculomotor Exam- Fixation Suppressed  Findings Comments  Ocular Alignment normal   Spontaneous Nystagmus abnormal L beating pure horizontal nystagmus, does not fatigue  Gaze-Holding Nystagmus abnormal 2nd degree L beating pure horizontal nystagmus  End-Gaze Nystagmus abnormal See above  Head Shaking Nystagmus abnormal Slight increase in frequency of pure horizontal L beating nystagmus  Pressure-Induced Nystagmus not examined   Hyperventilation Induced Nystagmus not examined   Skull Vibration Induced Nystagmus not examined     BPPV TESTS:  Symptoms Duration Intensity Nystagmus  L Dix-Hallpike None   None  R Dix-Hallpike None   None  L Head Roll None   None  R Head Roll None   None  L Sidelying Test      R Sidelying Test      (blank = not tested)   FUNCTIONAL OUTCOME MEASURES: Deferred    TODAY'S TREATMENT   SUBJECTIVE: Pt reports that he is doing well today. No significant changes since the last therapy session. No Meneire's attacks recently. Denies pain upon arrival and  RUE skin tear is healing. No specific questions or concerns.  PAIN: Denies   Neuromuscular Re-education  NuStep L4 x 6 minutes for warm-up during interval history (4 minutes unbilled); Forward/retro gait in hallway with vertical ball lifts with head/eye follow 2 x 70' both directions; Forward/retro gait in hallway with horizontal ball passes between hands with head/eye follow 2 x 70' toward each side in both directions; Forward gait in hallway with horizontal ball passes to therapist with head/eye follow 2 x 70' toward each side; Airex feet together with ball passes around body with therapist providing return pass on opposite side varying height x multiple bouts on each side; Airex feet together eyes closed x 30s; Airex feet together vertical head turns x 10 each; Airex  alternating 6" cone taps without UE support x 10 BLE; Airex feet apart forward bending with cone pick up and turn to pass to therapist with feet together x multiple bouts; HEP updated and reviewed with patient;   Not performed: VOR x 1 horizontal during forward ambulation in hallway 2 x 70', pt requires heavy tactile cues to maintain speed of head turns and still struggles to turn head fast enough; Airex balance beam side stepping in // bars x multiple lengths; Airex balance beam side stepping with horizontal head turns x multiple lengths; 6" step-ups from Airex pad to Airex pad on top of step x 10 leading with each LE; Tandem gait in // bars x multiple lengths;   HOME EXERCISE PROGRAM Access Code: [email or web address] URL: [email or web address] Date: 01/10/2022 Prepared by: Roxana Hires  Exercises - Seated Gaze Stabilization with Head Rotation  - 2 x daily - 7 x weekly - 3 reps - 60 seconds hold - Single Leg Stance with Support  - 2 x daily - 7 x weekly - 2 x 30s on each leg hold - Standing Tandem Balance with Counter Support  - 2 x daily - 7 x weekly - 2 x 30s on each leg hold - Side Stepping with Counter  Support  - 2 x daily - 7 x weekly - 2 x 60s hold   ASSESSMENT:  CLINICAL IMPRESSION: Patient demonstrates excellent motivation during session today. Continued with dynamic habituation exercises such as ball passes with head turns during gait. Also utilized unstable Airex pad again to challenge balance. Pt encouraged to follow-up as scheduled. Will continue to progress gaze stabilization, habituation, and balance exercises at future sessions. Pt will benefit from PT services to address deficits in balance and dizziness in order to return to full function at home.   REHAB POTENTIAL: Fair    CLINICAL DECISION MAKING: Unstable/unpredictable  EVALUATION COMPLEXITY: High   GOALS: Goals reviewed with patient? No  SHORT TERM GOALS: Target date: 12/21/2021  Pt will be independent with HEP for dizziness in order to decrease symptoms, improve balance,decrease fall risk, and improve function at home. Baseline: Goal status: INITIAL   LONG TERM GOALS: Target date: 01/18/2022  Pt will increase FOTO to at least 53 to demonstrate significant improvement in function at home related to dizziness.  Baseline: 11/23/21: 45; 01/03/22: 48; Goal status: INITIAL  2.  Pt will report a 50% reduction in his vertigo episodes in order to demonstrate clinically significant reduction in disability related to dizziness.  Baseline: 01/03/22: 30-40% improvement in frequency of vertigo episodes; Goal status: INITIAL  3. Pt will improve DGI by at least 3 points in order to demonstrate clinically significant improvement in balance and decreased risk for falls.     Baseline: 11/23/21: To be completed; 11/29/21: 21/24; 01/03/22: 21/24; Goal status: INITIAL   PLAN:  PT FREQUENCY: 1x/week  PT DURATION: 8 weeks  PLANNED INTERVENTIONS: Therapeutic exercises, Therapeutic activity, Neuromuscular re-education, Balance training, Gait training, Patient/Family education, Joint manipulation, Joint mobilization, Canalith  repositioning, Aquatic Therapy, Dry Needling, Cognitive remediation, Electrical stimulation, Spinal manipulation, Spinal mobilization, Cryotherapy, Moist heat, Traction, Ultrasound, Ionotophoresis '4mg'$ /ml Dexamethasone, and Manual therapy  PLAN FOR NEXT SESSION: continue gaze stabilization progression, habituation exercises, balance exercises.   Lyndel Safe Keyion Knack PT, DPT, GCS  Char Feltman 01/11/2022, 9:53 AM

## 2022-01-10 ENCOUNTER — Ambulatory Visit: Payer: Medicare Other

## 2022-01-10 DIAGNOSIS — R42 Dizziness and giddiness: Secondary | ICD-10-CM

## 2022-01-16 NOTE — Therapy (Signed)
OUTPATIENT PHYSICAL THERAPY VESTIBULAR TREATMENT/RECERTIFICATION   Patient Name: DEMOND SHALLENBERGER MRN: [id number] DOB:1940/07/07, 81 y.o., male Today's Date: 01/18/2022  PCP: Jerrol Banana., MD REFERRING PROVIDER: Jerrol Banana.,*   PT End of Session - 01/17/22 1538     Visit Number 8    Number of Visits 17    Date for PT Re-Evaluation 03/14/22    Authorization Type eval: 11/23/21    PT Start Time 4166    PT Stop Time 1615    PT Time Calculation (min) 45 min    Equipment Utilized During Treatment Gait belt    Activity Tolerance Patient tolerated treatment well    Behavior During Therapy [facility] for tasks assessed/performed              Past Medical History:  Diagnosis Date   B12 deficiency anemia 08/03/2019   Hypercholesterolemia    Hypertension    Vertigo    last episode approx 09/2018   Past Surgical History:  Procedure Laterality Date   APPENDECTOMY     CATARACT EXTRACTION Bilateral    COLONOSCOPY WITH PROPOFOL N/A 12/24/2018   Procedure: COLONOSCOPY WITH PROPOFOL;  Surgeon: Lucilla Lame, MD;  Location: [facility] ENDOSCOPY;  Service: Endoscopy;  Laterality: N/A;   COLONOSCOPY WITH PROPOFOL N/A 12/13/2019   Procedure: COLONOSCOPY WITH BIOPSY ;  Surgeon: Lucilla Lame, MD;  Location: [place];  Service: Endoscopy;  Laterality: N/A;  priority 3   PLEURAL SCARIFICATION  05/2015   POLYPECTOMY N/A 12/13/2019   Procedure: POLYPECTOMY;  Surgeon: Lucilla Lame, MD;  Location: [place];  Service: Endoscopy;  Laterality: N/A;   Patient Active Problem List   Diagnosis Date Noted   Personal history of colonic polyps    B12 deficiency anemia 08/03/2019   Elevated MCV 08/03/2019   Cyst, baker's knee, right 07/27/2019   Peripheral edema- right lower leg  07/27/2019   Encounter for screening colonoscopy 04/12/2019   Polyp of transverse colon 04/12/2019   Coronary artery disease involving native coronary artery of native heart without angina pectoris  04/12/2019   Benign neoplasm of cecum 04/12/2019   Valvular heart disease 04/12/2019   Benign neoplasm of cecum    Pneumothorax on right 05/15/2015   Benign essential tremor 02/28/2015   Mixed hyperlipidemia 02/28/2015   Essential hypertension 02/28/2015   Hypercholesterolemia without hypertriglyceridemia 02/28/2015   Vertigo 02/28/2015     PCP: Jerrol Banana., MD  REFERRING PROVIDER: Jerrol Banana.,*  REFERRING DIAGNOSIS: R42 (ICD-10-CM) - Vertigo   THERAPY DIAG: Dizziness and giddiness  RATIONALE FOR EVALUATION AND TREATMENT: Rehabilitation  ONSET DATE: 06/03/21 (approximate by patient however per medical record symptoms have been ongoing for years)  FOLLOW UP APPT WITH PROVIDER: Yes    SUBJECTIVE:   Chief Complaint:  Dizziness  Pertinent History Pt referred by PCP for dizziness. Per patient symptoms first started around January of this year when he started feeling "swimmy-headed." Based on the medical record from PCP it appearrs that these symptoms have been going on for multiple years. He was seen by Dr. Kathyrn Sheriff at [name] and was diagnosed with Menire's Disease. Pt reports that he was prescribed Betahistine which does appar to have shortened the duration of the attacks. He reports approximately 8-10 Menire's attacks over the last 6 months. Pt states that the attacks can least anywhere from 3-4 hours to 2-3 days. Since the onset of symptoms pt has experienced R sided hearing loss and R sided tinnitus. He is not currently  taking any diuretics. He reports having three ED visits as well as three brain MRIs due to these symptoms. Most recent MRI report within our record is from 04/12/20 which showed no cerebellopontine angle or internal auditory canal lesion. Unremarkable appearance of the seventh and eighth cranial nerves bilaterally. No evidence of acute intracranial abnormality. Stable mild cerebral white matter chronic small vessel ischemic disease. Mild  ethmoid and right maxillary sinus mucosal thickening. Small left maxillary sinus mucous retention cyst. Pt has history of HTN, CAD, benign essential tremor, HLD and chronic RLL peripheral edema.  Description of dizziness: vertigo Frequency: 8-10 attacks over the last 6 motnhs Duration: 3-4 hours to multiple days Symptom nature: intermittent and spontaneous Progression of symptoms since onset: no change in severity but frequency has increased; History of similar episodes: No  Provocative Factors: Unknown Easing Factors: betahistine, wait for symptoms to pass  Auditory complaints (tinnitus, pain, drainage, hearing loss, aural fullness): Yes, R sided hearing loss and R tinntius, denies pain or aural fullness Vision changes (diplopia, visual field loss, recent changes, recent eye exam): No Chest pain/palpitations: No History of head injury/concussion: No Stress/anxiety: No Headaches/migraines: No history of migraines, pt reports bilateral headache during episodes, denies visual changes/floaters/photophobia/phonophbia Syncope: Denies syncope but does report some lightheadedness Numbness/tingling/focal weakness: No  Has patient fallen in last 6 months? Yes, Number of falls: 4, all related to vertigo episodes Pertinent pain: No Dominant hand: right Imaging: Yes  Prior level of function: Independent Occupational demands: Hobbies:  Red Flags: (dysarthria, dysphagia, drop attacks, bowel and bladder changes, recent weight loss/gain) Review of systems negative for red flags.   PRECAUTIONS: Fall  WEIGHT BEARING RESTRICTIONS No  LIVING ENVIRONMENT: Lives with: lives with their spouse Lives in: House/apartment Stairs: No;  Has following equipment at home: Environmental consultant - 4 wheeled, shower chair, and Grab bars  PATIENT GOALS  Decrease dizziness   OBJECTIVE  EXAMINATION  POSTURE: No gross deficits contributing to symptoms  NEUROLOGICAL SCREEN: (2+ unless otherwise noted.) N=normal   Ab=abnormal  Level Dermatome R L Myotome R L Reflex R L  C3 Anterior Neck N N Sidebend C2-3 N N Jaw CN V    C4 Top of Shoulder N N Shoulder Shrug C4 N N Hoffman's UMN    C5 Lateral Upper Arm N N Shoulder ABD C4-5 N N Biceps C5-6    C6 Lateral Arm/ Thumb N N Arm Flex/ Wrist Ext C5-6 N N Brachiorad. C5-6    C7 Middle Finger N N Arm Ext//Wrist Flex C6-7 N N Triceps C7    C8 4th & 5th Finger N N Flex/ Ext Carpi Ulnaris C8 N N Patellar (L3-4)    T1 Medial Arm N N Interossei T1 N N Gastrocnemius    L2 Medial thigh/groin N N Illiopsoas (L2-3) N N     L3 Lower thigh/med.knee N N Quadriceps (L3-4) N N     L4 Medial leg/lat thigh N N Tibialis Ant (L4-5) N N     L5 Lat. leg & dorsal foot N N EHL (L5) N N     S1 post/lat foot/thigh/leg N N Gastrocnemius (S1-2) N N     S2 Post./med. thigh & leg N N Hamstrings (L4-S3) N N       CRANIAL NERVES II, III, IV, VI: Pupils equal and reactive to light, visual acuity and visual fields are intact, Pt with notable loss of vertical gaze bilaterally  but otherwise extraocular muscles are intact  V: Facial sensation is intact and symmetric bilaterally  VII: Facial strength is intact and symmetric bilaterally  VIII: Hearing is normal as tested by gross conversation IX, X: Palate elevates midline, normal phonation, uvula midline XI: Shoulder shrug strength is intact  XII: Tongue protrudes midline    SOMATOSENSORY Deferred   COORDINATION Finger to Nose: Normal with the exception of BUE tremors Heel to Shin: Normal Pronator Drift: Negative Rapid Alternating Movements: Normal Finger to Thumb Opposition: Difficult for patient bilaterally secondary to tremor  Resting tremor noted BUE, worse with movement    RANGE OF MOTION Cervical Spine AROM with mild to moderate loss but painless in all planes. No focal deficits in AROM noted in [place] strength WNL without focal deficits   TRANSFERS/GAIT Independent for transfers  and ambulation without assistive device    PATIENT SURVEYS FOTO: 45,predicted improvement to 53  ABC: 78.1% DHI: 4/100   POSTURAL CONTROL TESTS  Clinical Test of Sensory Interaction for Balance (CTSIB): Deferred   OCULOMOTOR / VESTIBULAR TESTING  Oculomotor Exam- Room Light  Findings Comments  Ocular Alignment normal   Ocular ROM abnormal Decreased vertical gaze in both eyes  Spontaneous Nystagmus abnormal L beating pure horizontal nystagmus, does not fatigue  Gaze-Holding Nystagmus normal 2nd degree L beating pure horizontal nystagmus  End-Gaze Nystagmus normal See above  Vergence (normal 2-3") not examined   Smooth Pursuit normal   Cross-Cover Test not examined   Saccades abnormal Slow and hypometric  VOR Cancellation normal   Left Head Impulse not examined Pt unable to relax head/neck so unable to perform  Right Head Impulse not examined See above  Static Acuity not examined   Dynamic Acuity not examined     Oculomotor Exam- Fixation Suppressed  Findings Comments  Ocular Alignment normal   Spontaneous Nystagmus abnormal L beating pure horizontal nystagmus, does not fatigue  Gaze-Holding Nystagmus abnormal 2nd degree L beating pure horizontal nystagmus  End-Gaze Nystagmus abnormal See above  Head Shaking Nystagmus abnormal Slight increase in frequency of pure horizontal L beating nystagmus  Pressure-Induced Nystagmus not examined   Hyperventilation Induced Nystagmus not examined   Skull Vibration Induced Nystagmus not examined     BPPV TESTS:  Symptoms Duration Intensity Nystagmus  L Dix-Hallpike None   None  R Dix-Hallpike None   None  L Head Roll None   None  R Head Roll None   None  L Sidelying Test      R Sidelying Test      (blank = not tested)   FUNCTIONAL OUTCOME MEASURES: Deferred    TODAY'S TREATMENT   SUBJECTIVE: Pt reports that he is doing well today. No significant changes since the last therapy session. No Meneire's attacks recently (at  least 2.5 weeks since last attack) Denies pain upon arrival. No specific questions or concerns.  PAIN: Denies   Neuromuscular Re-education  NuStep L4 x 6 minutes for warm-up during interval history (4 minutes unbilled); FOTO: 51 DGI: 23/24 % improvement: 50%  Forward/retro gait in hallway with vertical ball lifts with head/eye follow 2 x 70' both directions; Forward/retro gait in hallway with horizontal ball passes between hands with head/eye follow 2 x 70' toward each side in both directions; Forward gait in hallway with horizontal ball passes to therapist with head/eye follow 2 x 70' toward each side; VOR x 1 horizontal during forward/retro ambulation in hallway 3 x 70' each, pt requires heavy tactile cues to maintain speed of head turns and still struggles to turn head fast enough; Tandem balance  alternating forward LE x 30s with each foot forward; Tandem balance alternating forward LE with horizontal and vertical head turns x 30s each with both feet forward; Tandem gait 15' x 4;   Not performed: Airex balance beam side stepping in // bars x multiple lengths; Airex balance beam side stepping with horizontal head turns x multiple lengths; 6" step-ups from Airex pad to Airex pad on top of step x 10 leading with each LE; Airex feet together with ball passes around body with therapist providing return pass on opposite side varying height x multiple bouts on each side; Airex feet together eyes closed x 30s; Airex feet together vertical head turns x 10 each; Airex alternating 6" cone taps without UE support x 10 BLE; Airex feet apart forward bending with cone pick up and turn to pass to therapist with feet together x multiple bouts;   HOME EXERCISE PROGRAM Access Code: [email or web address] URL: [email or web address] Date: 01/10/2022 Prepared by: Roxana Hires  Exercises - Seated Gaze Stabilization with Head Rotation  - 2 x daily - 7 x weekly - 3 reps - 60 seconds hold - Single  Leg Stance with Support  - 2 x daily - 7 x weekly - 2 x 30s on each leg hold - Standing Tandem Balance with Counter Support  - 2 x daily - 7 x weekly - 2 x 30s on each leg hold - Side Stepping with Counter Support  - 2 x daily - 7 x weekly - 2 x 60s hold   ASSESSMENT:  CLINICAL IMPRESSION: Patient demonstrates excellent motivation during session today. Updated outcome measures with patient during appointment. His DGI has improved from 21/24 to 23/24 and his FOTO score improved from 45 at initial evaluation to 51 today. Overall he reports approximately 50% improvement in the frequency of vertigo episodes since starting with therapy. After updating goals continued with dynamic habituation exercises such as ball passes with head turns during gait. Also utilized narrow stance positions to challenge balance. Given improvement since starting with therapy episode of care extended. Pt encouraged to follow-up as scheduled. Pt will benefit from PT services to address deficits in balance and dizziness in order to return to full function at home.   REHAB POTENTIAL: Fair    CLINICAL DECISION MAKING: Unstable/unpredictable  EVALUATION COMPLEXITY: High   GOALS: Goals reviewed with patient? No  SHORT TERM GOALS: Target date: ACHIEVED  Pt will be independent with HEP for dizziness in order to decrease symptoms, improve balance,decrease fall risk, and improve function at home. Baseline: Goal status: ACHIEVED   LONG TERM GOALS: Target date: 03/14/22  Pt will increase FOTO to at least 53 to demonstrate significant improvement in function at home related to dizziness.  Baseline: 11/23/21: 45; 01/03/22: 48; 01/17/22: 51 Goal status: PARTIALLY MET  2.  Pt will report a 50% reduction in his vertigo episodes in order to demonstrate clinically significant reduction in disability related to dizziness.  Baseline: 01/03/22: 30-40% improvement in frequency of vertigo episodes; 01/17/22: 50% improvement Goal status:  ACHIEVED  3. Pt will improve DGI by at least 3 points in order to demonstrate clinically significant improvement in balance and decreased risk for falls.     Baseline: 11/23/21: To be completed; 11/29/21: 21/24; 01/03/22: 21/24; 01/17/22: 23/24; Goal status: PARTIALLY MET   PLAN:  PT FREQUENCY: 1x/week  PT DURATION: 8 weeks  PLANNED INTERVENTIONS: Therapeutic exercises, Therapeutic activity, Neuromuscular re-education, Balance training, Gait training, Patient/Family education, Joint manipulation, Joint mobilization, Canalith repositioning, Aquatic Therapy, Dry Needling, Cognitive  remediation, Electrical stimulation, Spinal manipulation, Spinal mobilization, Cryotherapy, Moist heat, Traction, Ultrasound, Ionotophoresis 38m/ml Dexamethasone, and Manual therapy  PLAN FOR NEXT SESSION: continue gaze stabilization progression, habituation exercises, balance exercises.   JLyndel SafeHuprich PT, DPT, GCS  Sedric Guia 01/18/2022, 11:51 AM

## 2022-01-17 ENCOUNTER — Ambulatory Visit: Payer: Medicare Other

## 2022-01-17 DIAGNOSIS — R42 Dizziness and giddiness: Secondary | ICD-10-CM

## 2022-01-22 NOTE — Progress Notes (Signed)
Established patient visit  I,[name],acting as a scribe for Zachary Durie, MD.,have documented all relevant documentation on the behalf of Zachary Durie, MD,as directed by  Zachary Durie, MD while in the presence of Zachary Durie, MD.   Patient: Zachary [name]   DOB: [birth year]   81 y.o. Male  MRN: [id number] Visit Date: 01/23/2022  Today's healthcare provider: [name], MD   Chief Complaint  Patient presents with   Follow-up   Hypertension   Subjective    HPI  Comes in today for follow-up.  He feels a little bit better but continues to have problems with dizziness that makes him paranoid. Overall he feels well and needs feels okay about his appetite and his weight.  Hypertension, follow-up  BP Readings from Last 3 Encounters:  01/23/22 129/79  11/01/21 (!) 153/92  10/08/21 122/74   Wt Readings from Last 3 Encounters:  01/23/22 157 lb (71.2 kg)  11/01/21 170 lb (77.1 kg)  10/08/21 167 lb (75.8 kg)     He was last seen for hypertension 3 months ago.  Management since that visit includes; Good blood pressure control..  Outside blood pressures are not checking.  Pertinent labs Lab Results  Component Value Date   CHOL 171 03/15/2021   HDL 83 03/15/2021   LDLCALC 75 03/15/2021   TRIG 69 03/15/2021   CHOLHDL 2.1 03/15/2021   Lab Results  Component Value Date   NA 137 11/01/2021   K 3.5 11/01/2021   CREATININE 1.11 11/01/2021   GFRNONAA >60 11/01/2021   GLUCOSE 150 (H) 11/01/2021   TSH 3.350 07/16/2021     The 10-year ASCVD risk score (Arnett DK, et al., 2019) is: 29.2%  ---------------------------------------------------------------------------------------------------  Patient states he stopped Dyazide due to headaches and dizziness.  Medications: Outpatient Medications Prior to Visit  Medication Sig   aspirin EC 81 MG tablet Take 81 mg by mouth at bedtime.   atorvastatin (LIPITOR) 20 MG tablet TAKE 1 TABLET BY  MOUTH EVERYDAY AT BEDTIME   lisinopril (ZESTRIL) 20 MG tablet TAKE 1 TABLET BY MOUTH EVERY DAY   PRESCRIPTION MEDICATION betahistine   triamterene-hydrochlorothiazide (DYAZIDE) 37.5-25 MG capsule TAKE 1 CAPSULE BY MOUTH EVERY DAY (Patient not taking: Reported on 01/23/2022)   No facility-administered medications prior to visit.    Review of Systems  Constitutional:  Negative for appetite change, chills and fever.  Respiratory:  Negative for chest tightness, shortness of breath and wheezing.   Cardiovascular:  Negative for chest pain and palpitations.  Gastrointestinal:  Negative for abdominal pain, nausea and vomiting.    Last CBC Lab Results  Component Value Date   WBC 3.9 (L) 11/01/2021   HGB 13.6 11/01/2021   HCT 40.2 11/01/2021   MCV 95.9 11/01/2021   MCH 32.5 11/01/2021   RDW 12.9 11/01/2021   PLT 186 11/01/2021       Objective    BP 129/79 (BP Location: Left Arm, Patient Position: Sitting, Cuff Size: Normal)   Pulse 62   Resp 16   Wt 157 lb (71.2 kg)   SpO2 100%   BMI 20.16 kg/m  BP Readings from Last 3 Encounters:  01/23/22 129/79  11/01/21 (!) 153/92  10/08/21 122/74   Wt Readings from Last 3 Encounters:  01/23/22 157 lb (71.2 kg)  11/01/21 170 lb (77.1 kg)  10/08/21 167 lb (75.8 kg)      Physical Exam Vitals reviewed.  Constitutional:      General:  He is not in acute distress.    Appearance: He is well-developed.  HENT:     Head: Normocephalic and atraumatic.     Right Ear: Hearing normal.     Left Ear: Hearing normal.     Nose: Nose normal.  Eyes:     General: Lids are normal. No scleral icterus.       Right eye: No discharge.        Left eye: No discharge.     Conjunctiva/sclera: Conjunctivae normal.  Cardiovascular:     Rate and Rhythm: Normal rate and regular rhythm.     Heart sounds: Normal heart sounds.  Pulmonary:     Effort: Pulmonary effort is normal. No respiratory distress.     Breath sounds: Normal breath sounds.   Musculoskeletal:     Right lower leg: No edema.     Left lower leg: No edema.  Skin:    General: Skin is warm and dry.     Findings: No lesion or rash.  Neurological:     General: No focal deficit present.     Mental Status: He is alert and oriented to person, place, and time.  Psychiatric:        Mood and Affect: Mood normal.        Speech: Speech normal.        Behavior: Behavior normal.        Thought Content: Thought content normal.        Judgment: Judgment normal.       No results found for any visits on 01/23/22.  Assessment & Plan     1. Essential hypertension Stop lisinopril and try Dyazide every morning.  2. Meniere's disease, unspecified laterality See if Dyazide helps his Mnire's a little  3. Vertigo   4. Weight loss Patient wishes no work-up at this time.  I will see him back in 2 to 3 weeks  5. Hypercholesterolemia without hypertriglyceridemia   6. Renal insufficiency    No follow-ups on file.      I, Zachary Durie, MD, have reviewed all documentation for this visit. The documentation on 01/25/22 for the exam, diagnosis, procedures, and orders are all accurate and complete.    Kidada Ging Cranford Mon, MD  [facility] [phone] (phone) [phone] (fax)  [place]

## 2022-01-23 ENCOUNTER — Ambulatory Visit (INDEPENDENT_AMBULATORY_CARE_PROVIDER_SITE_OTHER): Payer: Medicare Other | Admitting: Family Medicine

## 2022-01-23 ENCOUNTER — Encounter: Payer: Self-pay | Admitting: Family Medicine

## 2022-01-23 VITALS — BP 129/79 | HR 62 | Resp 16 | Wt 157.0 lb

## 2022-01-23 DIAGNOSIS — R634 Abnormal weight loss: Secondary | ICD-10-CM | POA: Diagnosis not present

## 2022-01-23 DIAGNOSIS — N289 Disorder of kidney and ureter, unspecified: Secondary | ICD-10-CM

## 2022-01-23 DIAGNOSIS — R42 Dizziness and giddiness: Secondary | ICD-10-CM

## 2022-01-23 DIAGNOSIS — I2510 Atherosclerotic heart disease of native coronary artery without angina pectoris: Secondary | ICD-10-CM

## 2022-01-23 DIAGNOSIS — H8109 Meniere's disease, unspecified ear: Secondary | ICD-10-CM | POA: Diagnosis not present

## 2022-01-23 DIAGNOSIS — I10 Essential (primary) hypertension: Secondary | ICD-10-CM | POA: Diagnosis not present

## 2022-01-23 DIAGNOSIS — E7800 Pure hypercholesterolemia, unspecified: Secondary | ICD-10-CM | POA: Diagnosis not present

## 2022-01-23 NOTE — Patient Instructions (Signed)
Stop lisinopril and try Dyazide every morning.

## 2022-01-24 ENCOUNTER — Ambulatory Visit: Payer: Medicare Other

## 2022-01-24 DIAGNOSIS — R42 Dizziness and giddiness: Secondary | ICD-10-CM | POA: Diagnosis not present

## 2022-01-24 NOTE — Therapy (Signed)
OUTPATIENT PHYSICAL THERAPY VESTIBULAR TREATMENT  Patient Name: Zachary [name] MRN: [id number] DOB:1940-09-13, 81 y.o., male Today's Date: 01/25/2022  PCP: Jerrol Banana., MD REFERRING PROVIDER: Jerrol Banana.,*   PT End of Session - 01/24/22 1535     Visit Number 9    Number of Visits 17    Date for PT Re-Evaluation 03/14/22    Authorization Type eval: 11/23/21    PT Start Time 1535    PT Stop Time 1615    PT Time Calculation (min) 40 min    Equipment Utilized During Treatment Gait belt    Activity Tolerance Patient tolerated treatment well    Behavior During Therapy [facility] for tasks assessed/performed             Past Medical History:  Diagnosis Date   B12 deficiency anemia 08/03/2019   Hypercholesterolemia    Hypertension    Vertigo    last episode approx 09/2018   Past Surgical History:  Procedure Laterality Date   APPENDECTOMY     CATARACT EXTRACTION Bilateral    COLONOSCOPY WITH PROPOFOL N/A 12/24/2018   Procedure: COLONOSCOPY WITH PROPOFOL;  Surgeon: Lucilla Lame, MD;  Location: [facility] ENDOSCOPY;  Service: Endoscopy;  Laterality: N/A;   COLONOSCOPY WITH PROPOFOL N/A 12/13/2019   Procedure: COLONOSCOPY WITH BIOPSY ;  Surgeon: Lucilla Lame, MD;  Location: [place];  Service: Endoscopy;  Laterality: N/A;  priority 3   PLEURAL SCARIFICATION  05/2015   POLYPECTOMY N/A 12/13/2019   Procedure: POLYPECTOMY;  Surgeon: Lucilla Lame, MD;  Location: [place];  Service: Endoscopy;  Laterality: N/A;   Patient Active Problem List   Diagnosis Date Noted   Personal history of colonic polyps    B12 deficiency anemia 08/03/2019   Elevated MCV 08/03/2019   Cyst, baker's knee, right 07/27/2019   Peripheral edema- right lower leg  07/27/2019   Encounter for screening colonoscopy 04/12/2019   Polyp of transverse colon 04/12/2019   Coronary artery disease involving native coronary artery of native heart without angina pectoris 04/12/2019    Benign neoplasm of cecum 04/12/2019   Valvular heart disease 04/12/2019   Benign neoplasm of cecum    Pneumothorax on right 05/15/2015   Benign essential tremor 02/28/2015   Mixed hyperlipidemia 02/28/2015   Essential hypertension 02/28/2015   Hypercholesterolemia without hypertriglyceridemia 02/28/2015   Vertigo 02/28/2015    PCP: Jerrol Banana., MD  REFERRING PROVIDER: Jerrol Banana.,*  REFERRING DIAGNOSIS: R42 (ICD-10-CM) - Vertigo   THERAPY DIAG: Dizziness and giddiness  RATIONALE FOR EVALUATION AND TREATMENT: Rehabilitation  ONSET DATE: 06/03/21 (approximate by patient however per medical record symptoms have been ongoing for years)  FOLLOW UP APPT WITH PROVIDER: Yes    SUBJECTIVE:   Chief Complaint:  Dizziness  Pertinent History Pt referred by PCP for dizziness. Per patient symptoms first started around January of this year when he started feeling "swimmy-headed." Based on the medical record from PCP it appearrs that these symptoms have been going on for multiple years. He was seen by Dr. Kathyrn Sheriff at [facility] and was diagnosed with Menire's Disease. Pt reports that he was prescribed Betahistine which does appar to have shortened the duration of the attacks. He reports approximately 8-10 Menire's attacks over the last 6 months. Pt states that the attacks can least anywhere from 3-4 hours to 2-3 days. Since the onset of symptoms pt has experienced R sided hearing loss and R sided tinnitus. He is not currently taking any diuretics.  He reports having three ED visits as well as three brain MRIs due to these symptoms. Most recent MRI report within our record is from 04/12/20 which showed no cerebellopontine angle or internal auditory canal lesion. Unremarkable appearance of the seventh and eighth cranial nerves bilaterally. No evidence of acute intracranial abnormality. Stable mild cerebral white matter chronic small vessel ischemic disease. Mild ethmoid and right  maxillary sinus mucosal thickening. Small left maxillary sinus mucous retention cyst. Pt has history of HTN, CAD, benign essential tremor, HLD and chronic RLL peripheral edema.  Description of dizziness: vertigo Frequency: 8-10 attacks over the last 6 motnhs Duration: 3-4 hours to multiple days Symptom nature: intermittent and spontaneous Progression of symptoms since onset: no change in severity but frequency has increased; History of similar episodes: No  Provocative Factors: Unknown Easing Factors: betahistine, wait for symptoms to pass  Auditory complaints (tinnitus, pain, drainage, hearing loss, aural fullness): Yes, R sided hearing loss and R tinntius, denies pain or aural fullness Vision changes (diplopia, visual field loss, recent changes, recent eye exam): No Chest pain/palpitations: No History of head injury/concussion: No Stress/anxiety: No Headaches/migraines: No history of migraines, pt reports bilateral headache during episodes, denies visual changes/floaters/photophobia/phonophbia Syncope: Denies syncope but does report some lightheadedness Numbness/tingling/focal weakness: No  Has patient fallen in last 6 months? Yes, Number of falls: 4, all related to vertigo episodes Pertinent pain: No Dominant hand: right Imaging: Yes  Prior level of function: Independent Occupational demands: Hobbies:  Red Flags: (dysarthria, dysphagia, drop attacks, bowel and bladder changes, recent weight loss/gain) Review of systems negative for red flags.   PRECAUTIONS: Fall  WEIGHT BEARING RESTRICTIONS No  LIVING ENVIRONMENT: Lives with: lives with their spouse Lives in: House/apartment Stairs: No;  Has following equipment at home: Environmental consultant - 4 wheeled, shower chair, and Grab bars  PATIENT GOALS  Decrease dizziness   OBJECTIVE  EXAMINATION  POSTURE: No gross deficits contributing to symptoms  NEUROLOGICAL SCREEN: (2+ unless otherwise noted.) N=normal  Ab=abnormal  Level  Dermatome R L Myotome R L Reflex R L  C3 Anterior Neck N N Sidebend C2-3 N N Jaw CN V    C4 Top of Shoulder N N Shoulder Shrug C4 N N Hoffman's UMN    C5 Lateral Upper Arm N N Shoulder ABD C4-5 N N Biceps C5-6    C6 Lateral Arm/ Thumb N N Arm Flex/ Wrist Ext C5-6 N N Brachiorad. C5-6    C7 Middle Finger N N Arm Ext//Wrist Flex C6-7 N N Triceps C7    C8 4th & 5th Finger N N Flex/ Ext Carpi Ulnaris C8 N N Patellar (L3-4)    T1 Medial Arm N N Interossei T1 N N Gastrocnemius    L2 Medial thigh/groin N N Illiopsoas (L2-3) N N     L3 Lower thigh/med.knee N N Quadriceps (L3-4) N N     L4 Medial leg/lat thigh N N Tibialis Ant (L4-5) N N     L5 Lat. leg & dorsal foot N N EHL (L5) N N     S1 post/lat foot/thigh/leg N N Gastrocnemius (S1-2) N N     S2 Post./med. thigh & leg N N Hamstrings (L4-S3) N N       CRANIAL NERVES II, III, IV, VI: Pupils equal and reactive to light, visual acuity and visual fields are intact, Pt with notable loss of vertical gaze bilaterally  but otherwise extraocular muscles are intact  V: Facial sensation is intact and symmetric bilaterally  VII: Facial strength  is intact and symmetric bilaterally  VIII: Hearing is normal as tested by gross conversation IX, X: Palate elevates midline, normal phonation, uvula midline XI: Shoulder shrug strength is intact  XII: Tongue protrudes midline    SOMATOSENSORY Deferred   COORDINATION Finger to Nose: Normal with the exception of BUE tremors Heel to Shin: Normal Pronator Drift: Negative Rapid Alternating Movements: Normal Finger to Thumb Opposition: Difficult for patient bilaterally secondary to tremor  Resting tremor noted BUE, worse with movement    RANGE OF MOTION Cervical Spine AROM with mild to moderate loss but painless in all planes. No focal deficits in AROM noted in [place] strength WNL without focal deficits   TRANSFERS/GAIT Independent for transfers and ambulation without  assistive device    PATIENT SURVEYS FOTO: 45,predicted improvement to 53  ABC: 78.1% DHI: 4/100   POSTURAL CONTROL TESTS  Clinical Test of Sensory Interaction for Balance (CTSIB): Deferred   OCULOMOTOR / VESTIBULAR TESTING  Oculomotor Exam- Room Light  Findings Comments  Ocular Alignment normal   Ocular ROM abnormal Decreased vertical gaze in both eyes  Spontaneous Nystagmus abnormal L beating pure horizontal nystagmus, does not fatigue  Gaze-Holding Nystagmus normal 2nd degree L beating pure horizontal nystagmus  End-Gaze Nystagmus normal See above  Vergence (normal 2-3") not examined   Smooth Pursuit normal   Cross-Cover Test not examined   Saccades abnormal Slow and hypometric  VOR Cancellation normal   Left Head Impulse not examined Pt unable to relax head/neck so unable to perform  Right Head Impulse not examined See above  Static Acuity not examined   Dynamic Acuity not examined     Oculomotor Exam- Fixation Suppressed  Findings Comments  Ocular Alignment normal   Spontaneous Nystagmus abnormal L beating pure horizontal nystagmus, does not fatigue  Gaze-Holding Nystagmus abnormal 2nd degree L beating pure horizontal nystagmus  End-Gaze Nystagmus abnormal See above  Head Shaking Nystagmus abnormal Slight increase in frequency of pure horizontal L beating nystagmus  Pressure-Induced Nystagmus not examined   Hyperventilation Induced Nystagmus not examined   Skull Vibration Induced Nystagmus not examined     BPPV TESTS:  Symptoms Duration Intensity Nystagmus  L Dix-Hallpike None   None  R Dix-Hallpike None   None  L Head Roll None   None  R Head Roll None   None  L Sidelying Test      R Sidelying Test      (blank = not tested)   FUNCTIONAL OUTCOME MEASURES: Deferred    TODAY'S TREATMENT   SUBJECTIVE: Pt reports that he is doing well today. Denies pain upon arrival. No significant changes since the last therapy session. No Meneire's attacks since the  last therapy session (at least 3.5 weeks since last attack). He saw Dr. Rosanna Randy who advised him to discontinue his lisinopril so he could start back on the Dyazide without lowering his blood pressure too much. That way he could get the benefit of being on a diuretic. No specific questions or concerns.   PAIN: Denies   Neuromuscular Re-education  NuStep L2-4 x 6 minutes for warm-up during interval history (3 minutes unbilled); Tandem gait with horizontal and vertical head turns 15' x 4; Airex balance beam side stepping in // bars x multiple lengths; Airex balance beam side stepping with horizontal head turns x multiple lengths; Airex tandem alternating forward LE with horizontal and vertical head turns x 30s each with both feet forward; Forward/retro gait in hallway with vertical ball  lifts with head/eye follow 2 x 70' both directions; Forward/retro gait in hallway with horizontal ball passes between hands with head/eye follow 2 x 70' toward each side in both directions; Forward/retro gait in hallway with horizontal ball passes to therapist with head/eye follow x 70' toward each side in both directions;   Not performed: VOR x 1 horizontal during forward/retro ambulation in hallway 3 x 70' each, pt requires heavy tactile cues to maintain speed of head turns and still struggles to turn head fast enough; 6" step-ups from Airex pad to Airex pad on top of step x 10 leading with each LE; Airex feet together with ball passes around body with therapist providing return pass on opposite side varying height x multiple bouts on each side; Airex feet together eyes closed x 30s; Airex feet together vertical head turns x 10 each; Airex alternating 6" cone taps without UE support x 10 BLE; Airex feet apart forward bending with cone pick up and turn to pass to therapist with feet together x multiple bouts; Tandem balance alternating forward LE x 30s with each foot forward;   HOME EXERCISE PROGRAM Access  Code: [email or web address] URL: [email or web address] Date: 01/10/2022 Prepared by: Roxana Hires  Exercises - Seated Gaze Stabilization with Head Rotation  - 2 x daily - 7 x weekly - 3 reps - 60 seconds hold - Single Leg Stance with Support  - 2 x daily - 7 x weekly - 2 x 30s on each leg hold - Standing Tandem Balance with Counter Support  - 2 x daily - 7 x weekly - 2 x 30s on each leg hold - Side Stepping with Counter Support  - 2 x daily - 7 x weekly - 2 x 60s hold   ASSESSMENT:  CLINICAL IMPRESSION: Patient demonstrates excellent motivation during session today. Continued with dynamic habituation exercises such as ball passes with head turns during gait. Deferred VOR with gait as pt struggles to turn his head fast enough to make it an effective exercise. Also utilized narrow stance positions and unstable Airex to challenge balance. Given improvement since starting with therapy episode of care extended. Pt encouraged to follow-up as scheduled. Pt will benefit from PT services to address deficits in balance and dizziness in order to return to full function at home.   REHAB POTENTIAL: Fair    CLINICAL DECISION MAKING: Unstable/unpredictable  EVALUATION COMPLEXITY: High   GOALS: Goals reviewed with patient? No  SHORT TERM GOALS: Target date: ACHIEVED  Pt will be independent with HEP for dizziness in order to decrease symptoms, improve balance,decrease fall risk, and improve function at home. Baseline: Goal status: ACHIEVED   LONG TERM GOALS: Target date: 03/14/22  Pt will increase FOTO to at least 53 to demonstrate significant improvement in function at home related to dizziness.  Baseline: 11/23/21: 45; 01/03/22: 48; 01/17/22: 51 Goal status: PARTIALLY MET  2.  Pt will report a 50% reduction in his vertigo episodes in order to demonstrate clinically significant reduction in disability related to dizziness.  Baseline: 01/03/22: 30-40% improvement in frequency of vertigo episodes;  01/17/22: 50% improvement Goal status: ACHIEVED  3. Pt will improve DGI by at least 3 points in order to demonstrate clinically significant improvement in balance and decreased risk for falls.     Baseline: 11/23/21: To be completed; 11/29/21: 21/24; 01/03/22: 21/24; 01/17/22: 23/24; Goal status: PARTIALLY MET   PLAN:  PT FREQUENCY: 1x/week  PT DURATION: 8 weeks  PLANNED INTERVENTIONS: Therapeutic exercises, Therapeutic activity, Neuromuscular re-education, Balance training,  Gait training, Patient/Family education, Joint manipulation, Joint mobilization, Canalith repositioning, Aquatic Therapy, Dry Needling, Cognitive remediation, Electrical stimulation, Spinal manipulation, Spinal mobilization, Cryotherapy, Moist heat, Traction, Ultrasound, Ionotophoresis 54m/ml Dexamethasone, and Manual therapy  PLAN FOR NEXT SESSION: continue gaze stabilization progression, habituation exercises, balance exercises.   JLyndel SafeHuprich PT, DPT, GCS  Belen Zwahlen 01/25/2022, 1:28 PM

## 2022-01-28 NOTE — Therapy (Signed)
OUTPATIENT PHYSICAL THERAPY VESTIBULAR TREATMENT/PROGRESS NOTE  Dates of reporting period  11/23/21   to   01/31/22  Patient Name: Zachary [name] MRN: [id number] DOB:1940/09/08, 81 y.o., male Today's Date: 01/31/2022  PCP: Jerrol Banana., MD REFERRING PROVIDER: Jerrol Banana.,*   PT End of Session - 01/31/22 1539     Visit Number 10    Number of Visits 17    Date for PT Re-Evaluation 03/14/22    Authorization Type eval: 11/23/21    PT Start Time 1535    PT Stop Time 1615    PT Time Calculation (min) 40 min    Equipment Utilized During Treatment Gait belt    Activity Tolerance Patient tolerated treatment well    Behavior During Therapy [facility] for tasks assessed/performed              Past Medical History:  Diagnosis Date   B12 deficiency anemia 08/03/2019   Hypercholesterolemia    Hypertension    Vertigo    last episode approx 09/2018   Past Surgical History:  Procedure Laterality Date   APPENDECTOMY     CATARACT EXTRACTION Bilateral    COLONOSCOPY WITH PROPOFOL N/A 12/24/2018   Procedure: COLONOSCOPY WITH PROPOFOL;  Surgeon: Lucilla Lame, MD;  Location: [facility]COPY;  Service: Endoscopy;  Laterality: N/A;   COLONOSCOPY WITH PROPOFOL N/A 12/13/2019   Procedure: COLONOSCOPY WITH BIOPSY ;  Surgeon: Lucilla Lame, MD;  Location: [place];  Service: Endoscopy;  Laterality: N/A;  priority 3   PLEURAL SCARIFICATION  05/2015   POLYPECTOMY N/A 12/13/2019   Procedure: POLYPECTOMY;  Surgeon: Lucilla Lame, MD;  Location: [place];  Service: Endoscopy;  Laterality: N/A;   Patient Active Problem List   Diagnosis Date Noted   Personal history of colonic polyps    B12 deficiency anemia 08/03/2019   Elevated MCV 08/03/2019   Cyst, baker's knee, right 07/27/2019   Peripheral edema- right lower leg  07/27/2019   Encounter for screening colonoscopy 04/12/2019   Polyp of transverse colon 04/12/2019   Coronary artery disease involving native  coronary artery of native heart without angina pectoris 04/12/2019   Benign neoplasm of cecum 04/12/2019   Valvular heart disease 04/12/2019   Benign neoplasm of cecum    Pneumothorax on right 05/15/2015   Benign essential tremor 02/28/2015   Mixed hyperlipidemia 02/28/2015   Essential hypertension 02/28/2015   Hypercholesterolemia without hypertriglyceridemia 02/28/2015   Vertigo 02/28/2015    PCP: Jerrol Banana., MD  REFERRING PROVIDER: Jerrol Banana.,*  REFERRING DIAGNOSIS: R42 (ICD-10-CM) - Vertigo   THERAPY DIAG: Dizziness and giddiness  RATIONALE FOR EVALUATION AND TREATMENT: Rehabilitation  ONSET DATE: 06/03/21 (approximate by patient however per medical record symptoms have been ongoing for years)  FOLLOW UP APPT WITH PROVIDER: Yes    SUBJECTIVE:   Chief Complaint:  Dizziness  Pertinent History Pt referred by PCP for dizziness. Per patient symptoms first started around January of this year when he started feeling "swimmy-headed." Based on the medical record from PCP it appearrs that these symptoms have been going on for multiple years. He was seen by Dr. Kathyrn Sheriff at [facility] and was diagnosed with Menire's Disease. Pt reports that he was prescribed Betahistine which does appar to have shortened the duration of the attacks. He reports approximately 8-10 Menire's attacks over the last 6 months. Pt states that the attacks can least anywhere from 3-4 hours to 2-3 days. Since the onset of symptoms pt has experienced  R sided hearing loss and R sided tinnitus. He is not currently taking any diuretics. He reports having three ED visits as well as three brain MRIs due to these symptoms. Most recent MRI report within our record is from 04/12/20 which showed no cerebellopontine angle or internal auditory canal lesion. Unremarkable appearance of the seventh and eighth cranial nerves bilaterally. No evidence of acute intracranial abnormality. Stable mild cerebral white  matter chronic small vessel ischemic disease. Mild ethmoid and right maxillary sinus mucosal thickening. Small left maxillary sinus mucous retention cyst. Pt has history of HTN, CAD, benign essential tremor, HLD and chronic RLL peripheral edema.  Description of dizziness: vertigo Frequency: 8-10 attacks over the last 6 motnhs Duration: 3-4 hours to multiple days Symptom nature: intermittent and spontaneous Progression of symptoms since onset: no change in severity but frequency has increased; History of similar episodes: No  Provocative Factors: Unknown Easing Factors: betahistine, wait for symptoms to pass  Auditory complaints (tinnitus, pain, drainage, hearing loss, aural fullness): Yes, R sided hearing loss and R tinntius, denies pain or aural fullness Vision changes (diplopia, visual field loss, recent changes, recent eye exam): No Chest pain/palpitations: No History of head injury/concussion: No Stress/anxiety: No Headaches/migraines: No history of migraines, pt reports bilateral headache during episodes, denies visual changes/floaters/photophobia/phonophbia Syncope: Denies syncope but does report some lightheadedness Numbness/tingling/focal weakness: No  Has patient fallen in last 6 months? Yes, Number of falls: 4, all related to vertigo episodes Pertinent pain: No Dominant hand: right Imaging: Yes  Prior level of function: Independent Occupational demands: Hobbies:  Red Flags: (dysarthria, dysphagia, drop attacks, bowel and bladder changes, recent weight loss/gain) Review of systems negative for red flags.   PRECAUTIONS: Fall  WEIGHT BEARING RESTRICTIONS No  LIVING ENVIRONMENT: Lives with: lives with their spouse Lives in: House/apartment Stairs: No;  Has following equipment at home: Environmental consultant - 4 wheeled, shower chair, and Grab bars  PATIENT GOALS  Decrease dizziness   OBJECTIVE  EXAMINATION  POSTURE: No gross deficits contributing to symptoms  NEUROLOGICAL  SCREEN: (2+ unless otherwise noted.) N=normal  Ab=abnormal  Level Dermatome R L Myotome R L Reflex R L  C3 Anterior Neck N N Sidebend C2-3 N N Jaw CN V    C4 Top of Shoulder N N Shoulder Shrug C4 N N Hoffman's UMN    C5 Lateral Upper Arm N N Shoulder ABD C4-5 N N Biceps C5-6    C6 Lateral Arm/ Thumb N N Arm Flex/ Wrist Ext C5-6 N N Brachiorad. C5-6    C7 Middle Finger N N Arm Ext//Wrist Flex C6-7 N N Triceps C7    C8 4th & 5th Finger N N Flex/ Ext Carpi Ulnaris C8 N N Patellar (L3-4)    T1 Medial Arm N N Interossei T1 N N Gastrocnemius    L2 Medial thigh/groin N N Illiopsoas (L2-3) N N     L3 Lower thigh/med.knee N N Quadriceps (L3-4) N N     L4 Medial leg/lat thigh N N Tibialis Ant (L4-5) N N     L5 Lat. leg & dorsal foot N N EHL (L5) N N     S1 post/lat foot/thigh/leg N N Gastrocnemius (S1-2) N N     S2 Post./med. thigh & leg N N Hamstrings (L4-S3) N N       CRANIAL NERVES II, III, IV, VI: Pupils equal and reactive to light, visual acuity and visual fields are intact, Pt with notable loss of vertical gaze bilaterally  but otherwise extraocular muscles  are intact  V: Facial sensation is intact and symmetric bilaterally  VII: Facial strength is intact and symmetric bilaterally  VIII: Hearing is normal as tested by gross conversation IX, X: Palate elevates midline, normal phonation, uvula midline XI: Shoulder shrug strength is intact  XII: Tongue protrudes midline    SOMATOSENSORY Deferred   COORDINATION Finger to Nose: Normal with the exception of BUE tremors Heel to Shin: Normal Pronator Drift: Negative Rapid Alternating Movements: Normal Finger to Thumb Opposition: Difficult for patient bilaterally secondary to tremor  Resting tremor noted BUE, worse with movement    RANGE OF MOTION Cervical Spine AROM with mild to moderate loss but painless in all planes. No focal deficits in AROM noted in [place] strength WNL without focal  deficits   TRANSFERS/GAIT Independent for transfers and ambulation without assistive device    PATIENT SURVEYS FOTO: 45,predicted improvement to 53  ABC: 78.1% DHI: 4/100   POSTURAL CONTROL TESTS  Clinical Test of Sensory Interaction for Balance (CTSIB): Deferred   OCULOMOTOR / VESTIBULAR TESTING  Oculomotor Exam- Room Light  Findings Comments  Ocular Alignment normal   Ocular ROM abnormal Decreased vertical gaze in both eyes  Spontaneous Nystagmus abnormal L beating pure horizontal nystagmus, does not fatigue  Gaze-Holding Nystagmus normal 2nd degree L beating pure horizontal nystagmus  End-Gaze Nystagmus normal See above  Vergence (normal 2-3") not examined   Smooth Pursuit normal   Cross-Cover Test not examined   Saccades abnormal Slow and hypometric  VOR Cancellation normal   Left Head Impulse not examined Pt unable to relax head/neck so unable to perform  Right Head Impulse not examined See above  Static Acuity not examined   Dynamic Acuity not examined     Oculomotor Exam- Fixation Suppressed  Findings Comments  Ocular Alignment normal   Spontaneous Nystagmus abnormal L beating pure horizontal nystagmus, does not fatigue  Gaze-Holding Nystagmus abnormal 2nd degree L beating pure horizontal nystagmus  End-Gaze Nystagmus abnormal See above  Head Shaking Nystagmus abnormal Slight increase in frequency of pure horizontal L beating nystagmus  Pressure-Induced Nystagmus not examined   Hyperventilation Induced Nystagmus not examined   Skull Vibration Induced Nystagmus not examined     BPPV TESTS:  Symptoms Duration Intensity Nystagmus  L Dix-Hallpike None   None  R Dix-Hallpike None   None  L Head Roll None   None  R Head Roll None   None  L Sidelying Test      R Sidelying Test      (blank = not tested)   FUNCTIONAL OUTCOME MEASURES: Deferred    TODAY'S TREATMENT   SUBJECTIVE: Pt reports that he is doing well today. Denies pain upon arrival. No  significant changes since the last therapy session. No Meneire's attacks since the last therapy session. He is taking his Dyazide without issue and has an appointment to see Dr. Rosanna Randy next week. No specific questions or concerns.   PAIN: Denies   Neuromuscular Re-education  NuStep L2-4 x 6 minutes for warm-up during interval history (3 minutes unbilled); Tandem gait 15' x 4; Airex feet together rebounder tosses x multiple bouts; Airex single leg balance rebounder tossess x multiple bouts; VOR x 1 horizontal and vertical during forward ambulation in hallway 2 x 70' each, pt requires heavy tactile cues to maintain speed of head turns and still struggles to turn head fast enough, no dizziness; Forward/retro gait in hallway with vertical ball lifts with head/eye follow 2 x 70'  both directions; Forward/retro gait in hallway with horizontal ball passes between hands with head/eye follow 2 x 70' each; Forward gait in hallway with horizontal ball passes to therapist with head/eye follow 2 x 70' toward each side; Ascend/descend steps without UE support x 4 times; Airex alternating cone taps on top of 6" step x 10 BLE;   Not performed: 6" step-ups from Airex pad to Airex pad on top of step x 10 leading with each LE; Airex feet together with ball passes around body with therapist providing return pass on opposite side varying height x multiple bouts on each side; Airex feet together eyes closed x 30s; Airex feet together vertical head turns x 10 each; Airex feet apart forward bending with cone pick up and turn to pass to therapist with feet together x multiple bouts; Tandem balance alternating forward LE x 30s with each foot forward; Airex balance beam side stepping in // bars x multiple lengths; Airex balance beam side stepping with horizontal head turns x multiple lengths; Airex tandem alternating forward LE with horizontal and vertical head turns x 30s each with both feet forward;   HOME  EXERCISE PROGRAM Access Code: [email or web address] URL: [email or web address] Date: 01/10/2022 Prepared by: Roxana Hires  Exercises - Seated Gaze Stabilization with Head Rotation  - 2 x daily - 7 x weekly - 3 reps - 60 seconds hold - Single Leg Stance with Support  - 2 x daily - 7 x weekly - 2 x 30s on each leg hold - Standing Tandem Balance with Counter Support  - 2 x daily - 7 x weekly - 2 x 30s on each leg hold - Side Stepping with Counter Support  - 2 x daily - 7 x weekly - 2 x 60s hold   ASSESSMENT:  CLINICAL IMPRESSION: Patient demonstrates excellent motivation during session today. Updated outcome measures with patient during 01/17/22 appointment so no need to update today. His DGI had improved from 21/24 to 23/24 and his FOTO score improved from 45 at initial evaluation to 51.Continued with dynamic habituation exercises such as ball passes with head turns during gait. Repeated VOR with gait as well. Utilized Airex pad for unstable surface challenge. Given improvement since starting with therapy episode of care extended. Pt encouraged to follow-up as scheduled. Pt will benefit from PT services to address deficits in balance and dizziness in order to return to full function at home.   REHAB POTENTIAL: Fair    CLINICAL DECISION MAKING: Unstable/unpredictable  EVALUATION COMPLEXITY: High   GOALS: Goals reviewed with patient? No  SHORT TERM GOALS: Target date: ACHIEVED  Pt will be independent with HEP for dizziness in order to decrease symptoms, improve balance,decrease fall risk, and improve function at home. Baseline: Goal status: ACHIEVED   LONG TERM GOALS: Target date: 03/14/22  Pt will increase FOTO to at least 53 to demonstrate significant improvement in function at home related to dizziness.  Baseline: 11/23/21: 45; 01/03/22: 48; 01/17/22: 51 Goal status: PARTIALLY MET  2.  Pt will report a 50% reduction in his vertigo episodes in order to demonstrate clinically  significant reduction in disability related to dizziness.  Baseline: 01/03/22: 30-40% improvement in frequency of vertigo episodes; 01/17/22: 50% improvement Goal status: ACHIEVED  3. Pt will improve DGI by at least 3 points in order to demonstrate clinically significant improvement in balance and decreased risk for falls.     Baseline: 11/23/21: To be completed; 11/29/21: 21/24; 01/03/22: 21/24; 01/17/22: 23/24; Goal status: PARTIALLY MET   PLAN:  PT FREQUENCY: 1x/week  PT DURATION: 8 weeks  PLANNED INTERVENTIONS: Therapeutic exercises, Therapeutic activity, Neuromuscular re-education, Balance training, Gait training, Patient/Family education, Joint manipulation, Joint mobilization, Canalith repositioning, Aquatic Therapy, Dry Needling, Cognitive remediation, Electrical stimulation, Spinal manipulation, Spinal mobilization, Cryotherapy, Moist heat, Traction, Ultrasound, Ionotophoresis 19m/ml Dexamethasone, and Manual therapy  PLAN FOR NEXT SESSION: continue gaze stabilization progression, habituation exercises, balance exercises.   JLyndel SafeHuprich PT, DPT, GCS  Abeer Iversen 01/31/2022, 6:20 PM

## 2022-01-31 ENCOUNTER — Ambulatory Visit: Payer: Medicare Other

## 2022-01-31 DIAGNOSIS — R42 Dizziness and giddiness: Secondary | ICD-10-CM | POA: Diagnosis not present

## 2022-02-05 ENCOUNTER — Encounter: Payer: Self-pay | Admitting: Family Medicine

## 2022-02-05 ENCOUNTER — Ambulatory Visit (INDEPENDENT_AMBULATORY_CARE_PROVIDER_SITE_OTHER): Payer: Medicare Other | Admitting: Family Medicine

## 2022-02-05 VITALS — BP 99/64 | HR 65 | Resp 16 | Wt 153.0 lb

## 2022-02-05 DIAGNOSIS — R634 Abnormal weight loss: Secondary | ICD-10-CM

## 2022-02-05 DIAGNOSIS — I10 Essential (primary) hypertension: Secondary | ICD-10-CM

## 2022-02-05 DIAGNOSIS — M791 Myalgia, unspecified site: Secondary | ICD-10-CM

## 2022-02-05 DIAGNOSIS — H8109 Meniere's disease, unspecified ear: Secondary | ICD-10-CM | POA: Diagnosis not present

## 2022-02-05 DIAGNOSIS — E782 Mixed hyperlipidemia: Secondary | ICD-10-CM | POA: Diagnosis not present

## 2022-02-05 DIAGNOSIS — I2510 Atherosclerotic heart disease of native coronary artery without angina pectoris: Secondary | ICD-10-CM

## 2022-02-05 DIAGNOSIS — N289 Disorder of kidney and ureter, unspecified: Secondary | ICD-10-CM

## 2022-02-05 NOTE — Progress Notes (Signed)
Established patient visit   Patient: Zachary [name]   DOB: 11-13-1940   81 y.o. Male  MRN: [id number] Visit Date: 02/05/2022  Today's healthcare provider: [name], MD   Chief Complaint  Patient presents with   Follow-up   Hypertension   Subjective    HPI  Overall patient feels well but he continues to lose weight.  His appetite is okay. He has no GI GU respiratory or cardiac symptoms. He states his Mnire's and dizziness are little bit better since starting the Dyazide.   Hypertension, follow-up  BP Readings from Last 3 Encounters:  02/05/22 99/64  01/23/22 129/79  11/01/21 (!) 153/92   Wt Readings from Last 3 Encounters:  02/05/22 153 lb (69.4 kg)  01/23/22 157 lb (71.2 kg)  11/01/21 170 lb (77.1 kg)     He was last seen for hypertension 2 weeks ago.  BP at that visit was as above. Management since that visit includes discontinuation of Lisinopril and starting Dyazide.  Outside blood pressures are not checking.  Pertinent labs Lab Results  Component Value Date   CHOL 171 03/15/2021   HDL 83 03/15/2021   LDLCALC 75 03/15/2021   TRIG 69 03/15/2021   CHOLHDL 2.1 03/15/2021   Lab Results  Component Value Date   NA 137 11/01/2021   K 3.5 11/01/2021   CREATININE 1.11 11/01/2021   GFRNONAA >60 11/01/2021   GLUCOSE 150 (H) 11/01/2021   TSH 3.350 07/16/2021     The 10-year ASCVD risk score (Arnett DK, et al., 2019) is: 20.8%  --------------------------------------------------------------------------------------------------- Meniere's Disease Patient was last seen on 01/23/22 for this.  At that time he was switched to Dyazide to see if this would help with the Meniere's as well as the blood pressure.  Medications: Outpatient Medications Prior to Visit  Medication Sig   aspirin EC 81 MG tablet Take 81 mg by mouth at bedtime.   atorvastatin (LIPITOR) 20 MG tablet TAKE 1 TABLET BY MOUTH EVERYDAY AT BEDTIME   PRESCRIPTION MEDICATION  betahistine   triamterene-hydrochlorothiazide (DYAZIDE) 37.5-25 MG capsule TAKE 1 CAPSULE BY MOUTH EVERY DAY   lisinopril (ZESTRIL) 20 MG tablet TAKE 1 TABLET BY MOUTH EVERY DAY (Patient not taking: Reported on 02/05/2022)   No facility-administered medications prior to visit.    Review of Systems      Objective    BP 99/64 (BP Location: Right Arm, Patient Position: Sitting, Cuff Size: Normal)   Pulse 65   Resp 16   Wt 153 lb (69.4 kg)   SpO2 99%   BMI 19.64 kg/m  BP Readings from Last 3 Encounters:  02/05/22 99/64  01/23/22 129/79  11/01/21 (!) 153/92   Wt Readings from Last 3 Encounters:  02/05/22 153 lb (69.4 kg)  01/23/22 157 lb (71.2 kg)  11/01/21 170 lb (77.1 kg)      Physical Exam Vitals reviewed.  Constitutional:      General: He is not in acute distress.    Appearance: He is well-developed.     Comments: Thin white male in no acute distress.  HENT:     Head: Normocephalic and atraumatic.     Right Ear: Hearing normal.     Left Ear: Hearing normal.     Nose: Nose normal.  Eyes:     General: Lids are normal. No scleral icterus.       Right eye: No discharge.        Left eye: No discharge.  Conjunctiva/sclera: Conjunctivae normal.  Cardiovascular:     Rate and Rhythm: Normal rate and regular rhythm.     Heart sounds: Normal heart sounds.  Pulmonary:     Effort: Pulmonary effort is normal. No respiratory distress.     Breath sounds: Normal breath sounds.  Musculoskeletal:     Right lower leg: No edema.     Left lower leg: No edema.  Skin:    General: Skin is warm and dry.     Findings: No lesion or rash.  Neurological:     General: No focal deficit present.     Mental Status: He is alert and oriented to person, place, and time.     Comments: Gait is normal.  Strength is normal. DTRs are mildly decreased/1+  Psychiatric:        Mood and Affect: Mood normal.        Speech: Speech normal.        Behavior: Behavior normal.        Thought Content:  Thought content normal.        Judgment: Judgment normal.       No results found for any visits on 02/05/22.  Assessment & Plan     1. Essential hypertension Patient now off of Zestril.  Follow blood pressures. - Protein Electrophoresis, (serum) - CBC w/Diff/Platelet - Sed Rate (ESR) - Comprehensive Metabolic Panel (CMET) - TSH - CK (Creatine Kinase)  2. Weight loss I am very concerned about weight loss but patient feels well.  And a normal colonoscopy 2 years ago. PSA was 1.4 in November 2021 - Protein Electrophoresis, (serum) - CBC w/Diff/Platelet - Sed Rate (ESR) - Comprehensive Metabolic Panel (CMET) - TSH - CK (Creatine Kinase) - CT ABDOMEN PELVIS W CONTRAST  3. Myalgia  - Protein Electrophoresis, (serum) - CBC w/Diff/Platelet - Sed Rate (ESR) - Comprehensive Metabolic Panel (CMET) - TSH - CK (Creatine Kinase)  4. Mixed hyperlipidemia  - Protein Electrophoresis, (serum) - CBC w/Diff/Platelet - Sed Rate (ESR) - Comprehensive Metabolic Panel (CMET) - TSH - CK (Creatine Kinase)  5. Meniere's disease, unspecified laterality Better on Dyazide.  Pain MRI was normal April 12, 2020 - Protein Electrophoresis, (serum) - CBC w/Diff/Platelet - Sed Rate (ESR) - Comprehensive Metabolic Panel (CMET) - TSH - CK (Creatine Kinase)   No follow-ups on file.      I, [name], MD, have reviewed all documentation for this visit. The documentation on 02/07/22 for the exam, diagnosis, procedures, and orders are all accurate and complete.    Bertha Earwood Cranford Mon, MD  [facility] [phone] (phone) [phone] (fax)  [place]

## 2022-02-09 LAB — CBC WITH DIFFERENTIAL/PLATELET
Basophils Absolute: 0.1 10*3/uL (ref 0.0–0.2)
Basos: 1 %
EOS (ABSOLUTE): 0.1 10*3/uL (ref 0.0–0.4)
Eos: 1 %
Hematocrit: 38.4 % (ref 37.5–51.0)
Hemoglobin: 13.6 g/dL (ref 13.0–17.7)
Immature Grans (Abs): 0.1 10*3/uL (ref 0.0–0.1)
Immature Granulocytes: 1 %
Lymphocytes Absolute: 0.9 10*3/uL (ref 0.7–3.1)
Lymphs: 13 %
MCH: 33.2 pg — ABNORMAL HIGH (ref 26.6–33.0)
MCHC: 35.4 g/dL (ref 31.5–35.7)
MCV: 94 fL (ref 79–97)
Monocytes Absolute: 0.7 10*3/uL (ref 0.1–0.9)
Monocytes: 10 %
Neutrophils Absolute: 5 10*3/uL (ref 1.4–7.0)
Neutrophils: 74 %
Platelets: 271 10*3/uL (ref 150–450)
RBC: 4.1 x10E6/uL — ABNORMAL LOW (ref 4.14–5.80)
RDW: 11.7 % (ref 11.6–15.4)
WBC: 6.7 10*3/uL (ref 3.4–10.8)

## 2022-02-09 LAB — COMPREHENSIVE METABOLIC PANEL
ALT: 12 IU/L (ref 0–44)
AST: 27 IU/L (ref 0–40)
Albumin/Globulin Ratio: 2.1 (ref 1.2–2.2)
Albumin: 4.5 g/dL (ref 3.8–4.8)
Alkaline Phosphatase: 80 IU/L (ref 44–121)
BUN/Creatinine Ratio: 15 (ref 10–24)
BUN: 22 mg/dL (ref 8–27)
Bilirubin Total: 0.9 mg/dL (ref 0.0–1.2)
CO2: 22 mmol/L (ref 20–29)
Calcium: 9.8 mg/dL (ref 8.6–10.2)
Chloride: 93 mmol/L — ABNORMAL LOW (ref 96–106)
Creatinine, Ser: 1.49 mg/dL — ABNORMAL HIGH (ref 0.76–1.27)
Globulin, Total: 2.1 g/dL (ref 1.5–4.5)
Glucose: 84 mg/dL (ref 70–99)
Potassium: 4.6 mmol/L (ref 3.5–5.2)
Sodium: 129 mmol/L — ABNORMAL LOW (ref 134–144)
Total Protein: 6.6 g/dL (ref 6.0–8.5)
eGFR: 47 mL/min/{1.73_m2} — ABNORMAL LOW (ref >59)

## 2022-02-09 LAB — PROTEIN ELECTROPHORESIS, SERUM
A/G Ratio: 1.6 (ref 0.7–1.7)
Albumin ELP: 4.1 g/dL (ref 2.9–4.4)
Alpha 1: 0.2 g/dL (ref 0.0–0.4)
Alpha 2: 0.6 g/dL (ref 0.4–1.0)
Beta: 0.8 g/dL (ref 0.7–1.3)
Gamma Globulin: 1 g/dL (ref 0.4–1.8)
Globulin, Total: 2.5 g/dL (ref 2.2–3.9)

## 2022-02-09 LAB — TSH: TSH: 3.96 u[IU]/mL (ref 0.450–4.500)

## 2022-02-09 LAB — CK: Total CK: 261 U/L (ref 41–331)

## 2022-02-09 LAB — SEDIMENTATION RATE: Sed Rate: 2 mm/hr (ref 0–30)

## 2022-02-12 DIAGNOSIS — D485 Neoplasm of uncertain behavior of skin: Secondary | ICD-10-CM | POA: Diagnosis not present

## 2022-02-12 DIAGNOSIS — L570 Actinic keratosis: Secondary | ICD-10-CM | POA: Diagnosis not present

## 2022-02-12 DIAGNOSIS — D225 Melanocytic nevi of trunk: Secondary | ICD-10-CM | POA: Diagnosis not present

## 2022-02-12 DIAGNOSIS — Z872 Personal history of diseases of the skin and subcutaneous tissue: Secondary | ICD-10-CM | POA: Diagnosis not present

## 2022-02-12 DIAGNOSIS — Z85828 Personal history of other malignant neoplasm of skin: Secondary | ICD-10-CM | POA: Diagnosis not present

## 2022-02-12 DIAGNOSIS — Z86018 Personal history of other benign neoplasm: Secondary | ICD-10-CM | POA: Diagnosis not present

## 2022-02-12 DIAGNOSIS — L578 Other skin changes due to chronic exposure to nonionizing radiation: Secondary | ICD-10-CM | POA: Diagnosis not present

## 2022-02-13 ENCOUNTER — Other Ambulatory Visit: Payer: Medicare Other

## 2022-02-13 DIAGNOSIS — H6122 Impacted cerumen, left ear: Secondary | ICD-10-CM | POA: Diagnosis not present

## 2022-02-13 DIAGNOSIS — H8109 Meniere's disease, unspecified ear: Secondary | ICD-10-CM | POA: Diagnosis not present

## 2022-02-18 DIAGNOSIS — Z23 Encounter for immunization: Secondary | ICD-10-CM | POA: Diagnosis not present

## 2022-02-19 ENCOUNTER — Ambulatory Visit: Payer: Medicare Other | Attending: Family Medicine

## 2022-02-19 DIAGNOSIS — R42 Dizziness and giddiness: Secondary | ICD-10-CM | POA: Insufficient documentation

## 2022-02-19 NOTE — Addendum Note (Signed)
Addended by: Smitty Knudsen on: 02/19/2022 10:23 AM   Modules accepted: Orders

## 2022-02-19 NOTE — Therapy (Signed)
OUTPATIENT PHYSICAL THERAPY VESTIBULAR TREATMENT   Patient Name: Zachary [name] MRN: [id number] DOB:15-Dec-1940, 81 y.o., male Today's Date: 02/19/2022  PCP: Jerrol Banana., MD REFERRING PROVIDER: Jerrol Banana.,*   PT End of Session - 02/19/22 1359     Visit Number 11    Number of Visits 17    Date for PT Re-Evaluation 03/14/22    Authorization Type eval: 11/23/21    PT Start Time 1400    PT Stop Time 1445    PT Time Calculation (min) 45 min    Equipment Utilized During Treatment Gait belt    Activity Tolerance Patient tolerated treatment well    Behavior During Therapy [facility] for tasks assessed/performed             Past Medical History:  Diagnosis Date   B12 deficiency anemia 08/03/2019   Hypercholesterolemia    Hypertension    Vertigo    last episode approx 09/2018   Past Surgical History:  Procedure Laterality Date   APPENDECTOMY     CATARACT EXTRACTION Bilateral    COLONOSCOPY WITH PROPOFOL N/A 12/24/2018   Procedure: COLONOSCOPY WITH PROPOFOL;  Surgeon: Lucilla Lame, MD;  Location: [facility] ENDOSCOPY;  Service: Endoscopy;  Laterality: N/A;   COLONOSCOPY WITH PROPOFOL N/A 12/13/2019   Procedure: COLONOSCOPY WITH BIOPSY ;  Surgeon: Lucilla Lame, MD;  Location: [place];  Service: Endoscopy;  Laterality: N/A;  priority 3   PLEURAL SCARIFICATION  05/2015   POLYPECTOMY N/A 12/13/2019   Procedure: POLYPECTOMY;  Surgeon: Lucilla Lame, MD;  Location: [place];  Service: Endoscopy;  Laterality: N/A;   Patient Active Problem List   Diagnosis Date Noted   Personal history of colonic polyps    B12 deficiency anemia 08/03/2019   Elevated MCV 08/03/2019   Cyst, baker's knee, right 07/27/2019   Peripheral edema- right lower leg  07/27/2019   Encounter for screening colonoscopy 04/12/2019   Polyp of transverse colon 04/12/2019   Coronary artery disease involving native coronary artery of native heart without angina pectoris 04/12/2019    Benign neoplasm of cecum 04/12/2019   Valvular heart disease 04/12/2019   Benign neoplasm of cecum    Pneumothorax on right 05/15/2015   Benign essential tremor 02/28/2015   Mixed hyperlipidemia 02/28/2015   Essential hypertension 02/28/2015   Hypercholesterolemia without hypertriglyceridemia 02/28/2015   Vertigo 02/28/2015    PCP: Jerrol Banana., MD  REFERRING PROVIDER: Jerrol Banana.,  REFERRING DIAGNOSIS: R42 (ICD-10-CM) - Vertigo   THERAPY DIAG: Dizziness and giddiness  RATIONALE FOR EVALUATION AND TREATMENT: Rehabilitation  ONSET DATE: 06/03/21 (approximate by patient however per medical record symptoms have been ongoing for years)  FOLLOW UP APPT WITH PROVIDER: Yes    SUBJECTIVE:   Chief Complaint:  Dizziness  Pertinent History Pt referred by PCP for dizziness. Per patient symptoms first started around January of this year when he started feeling "swimmy-headed." Based on the medical record from PCP it appearrs that these symptoms have been going on for multiple years. He was seen by Dr. Kathyrn Sheriff at [facility] and was diagnosed with Menire's Disease. Pt reports that he was prescribed Betahistine which does appar to have shortened the duration of the attacks. He reports approximately 8-10 Menire's attacks over the last 6 months. Pt states that the attacks can least anywhere from 3-4 hours to 2-3 days. Since the onset of symptoms pt has experienced R sided hearing loss and R sided tinnitus. He is not currently taking any  diuretics. He reports having three ED visits as well as three brain MRIs due to these symptoms. Most recent MRI report within our record is from 04/12/20 which showed no cerebellopontine angle or internal auditory canal lesion. Unremarkable appearance of the seventh and eighth cranial nerves bilaterally. No evidence of acute intracranial abnormality. Stable mild cerebral white matter chronic small vessel ischemic disease. Mild ethmoid and right  maxillary sinus mucosal thickening. Small left maxillary sinus mucous retention cyst. Pt has history of HTN, CAD, benign essential tremor, HLD and chronic RLL peripheral edema.  Description of dizziness: vertigo Frequency: 8-10 attacks over the last 6 motnhs Duration: 3-4 hours to multiple days Symptom nature: intermittent and spontaneous Progression of symptoms since onset: no change in severity but frequency has increased; History of similar episodes: No  Provocative Factors: Unknown Easing Factors: betahistine, wait for symptoms to pass  Auditory complaints (tinnitus, pain, drainage, hearing loss, aural fullness): Yes, R sided hearing loss and R tinntius, denies pain or aural fullness Vision changes (diplopia, visual field loss, recent changes, recent eye exam): No Chest pain/palpitations: No History of head injury/concussion: No Stress/anxiety: No Headaches/migraines: No history of migraines, pt reports bilateral headache during episodes, denies visual changes/floaters/photophobia/phonophbia Syncope: Denies syncope but does report some lightheadedness Numbness/tingling/focal weakness: No  Has patient fallen in last 6 months? Yes, Number of falls: 4, all related to vertigo episodes Pertinent pain: No Dominant hand: right Imaging: Yes  Prior level of function: Independent Occupational demands: Hobbies:  Red Flags: (dysarthria, dysphagia, drop attacks, bowel and bladder changes, recent weight loss/gain) Review of systems negative for red flags.   PRECAUTIONS: Fall  WEIGHT BEARING RESTRICTIONS No  LIVING ENVIRONMENT: Lives with: lives with their spouse Lives in: House/apartment Stairs: No;  Has following equipment at home: Environmental consultant - 4 wheeled, shower chair, and Grab bars  PATIENT GOALS  Decrease dizziness   OBJECTIVE  EXAMINATION  POSTURE: No gross deficits contributing to symptoms  NEUROLOGICAL SCREEN: (2+ unless otherwise noted.) N=normal  Ab=abnormal  Level  Dermatome R L Myotome R L Reflex R L  C3 Anterior Neck N N Sidebend C2-3 N N Jaw CN V    C4 Top of Shoulder N N Shoulder Shrug C4 N N Hoffman's UMN    C5 Lateral Upper Arm N N Shoulder ABD C4-5 N N Biceps C5-6    C6 Lateral Arm/ Thumb N N Arm Flex/ Wrist Ext C5-6 N N Brachiorad. C5-6    C7 Middle Finger N N Arm Ext//Wrist Flex C6-7 N N Triceps C7    C8 4th & 5th Finger N N Flex/ Ext Carpi Ulnaris C8 N N Patellar (L3-4)    T1 Medial Arm N N Interossei T1 N N Gastrocnemius    L2 Medial thigh/groin N N Illiopsoas (L2-3) N N     L3 Lower thigh/med.knee N N Quadriceps (L3-4) N N     L4 Medial leg/lat thigh N N Tibialis Ant (L4-5) N N     L5 Lat. leg & dorsal foot N N EHL (L5) N N     S1 post/lat foot/thigh/leg N N Gastrocnemius (S1-2) N N     S2 Post./med. thigh & leg N N Hamstrings (L4-S3) N N       CRANIAL NERVES II, III, IV, VI: Pupils equal and reactive to light, visual acuity and visual fields are intact, Pt with notable loss of vertical gaze bilaterally  but otherwise extraocular muscles are intact  V: Facial sensation is intact and symmetric bilaterally  VII: Facial  strength is intact and symmetric bilaterally  VIII: Hearing is normal as tested by gross conversation IX, X: Palate elevates midline, normal phonation, uvula midline XI: Shoulder shrug strength is intact  XII: Tongue protrudes midline   SOMATOSENSORY Deferred  COORDINATION Finger to Nose: Normal with the exception of BUE tremors Heel to Shin: Normal Pronator Drift: Negative Rapid Alternating Movements: Normal Finger to Thumb Opposition: Difficult for patient bilaterally secondary to tremor  Resting tremor noted BUE, worse with movement   RANGE OF MOTION Cervical Spine AROM with mild to moderate loss but painless in all planes. No focal deficits in AROM noted in Lampeter strength WNL without focal deficits  TRANSFERS/GAIT Independent for transfers and ambulation without assistive  device   PATIENT SURVEYS FOTO: 45,predicted improvement to 53  ABC: 78.1% DHI: 4/100  POSTURAL CONTROL TESTS  Clinical Test of Sensory Interaction for Balance (CTSIB): Deferred   OCULOMOTOR / VESTIBULAR TESTING  Oculomotor Exam- Room Light  Findings Comments  Ocular Alignment normal   Ocular ROM abnormal Decreased vertical gaze in both eyes  Spontaneous Nystagmus abnormal L beating pure horizontal nystagmus, does not fatigue  Gaze-Holding Nystagmus normal 2nd degree L beating pure horizontal nystagmus  End-Gaze Nystagmus normal See above  Vergence (normal 2-3") not examined   Smooth Pursuit normal   Cross-Cover Test not examined   Saccades abnormal Slow and hypometric  VOR Cancellation normal   Left Head Impulse not examined Pt unable to relax head/neck so unable to perform  Right Head Impulse not examined See above  Static Acuity not examined   Dynamic Acuity not examined     Oculomotor Exam- Fixation Suppressed  Findings Comments  Ocular Alignment normal   Spontaneous Nystagmus abnormal L beating pure horizontal nystagmus, does not fatigue  Gaze-Holding Nystagmus abnormal 2nd degree L beating pure horizontal nystagmus  End-Gaze Nystagmus abnormal See above  Head Shaking Nystagmus abnormal Slight increase in frequency of pure horizontal L beating nystagmus  Pressure-Induced Nystagmus not examined   Hyperventilation Induced Nystagmus not examined   Skull Vibration Induced Nystagmus not examined     BPPV TESTS:  Symptoms Duration Intensity Nystagmus  L Dix-Hallpike None   None  R Dix-Hallpike None   None  L Head Roll None   None  R Head Roll None   None  L Sidelying Test      R Sidelying Test      (blank = not tested)   FUNCTIONAL OUTCOME MEASURES: Deferred    TODAY'S TREATMENT   SUBJECTIVE: Pt reports that he is doing well today. Denies pain upon arrival. No significant changes since the last therapy session. No Meneire's attacks since the last therapy  session. He saw Dr. Rosanna Randy who was concerned about his weight loss and ordered lab work as well as an abdominal CT. He also saw ENT last week without any significant updates. No specific questions or concerns.   PAIN: Denies   Neuromuscular Re-education  NuStep L2-3 x 6 minutes for warm-up during interval history (3 minutes unbilled); Tandem gait 15' x 4; Forward/retro gait outside across grass with vertical ball lifts with head/eye follow 2 x 70' both directions; Forward/retro gait outside across grass with horizontal ball passes between hands with head/eye follow 2 x 70' each; Forward gait outside across grass with eyes closed 2 x 70'; Cross-over side stepping outside across grass x 50' toward each side; VOR x 1 horizontal and vertical during forward/retro ambulation in hallway 2 x 70' each, pt requires  heavy tactile cues to maintain speed of head turns and still struggles to turn head fast enough, no dizziness; Airex alternating cone taps on top of 6" step x 10 BLE; Airex feet together with ball passes around body with therapist providing return pass on opposite side varying height x multiple bouts on each side; Sit to stand from regular heigh chair without UE support and Airex pad under feet, horizontal head turns right and left during standing x 10;   Not performed: 6" step-ups from Airex pad to Airex pad on top of step x 10 leading with each LE; Airex feet together eyes closed x 30s; Airex feet together vertical head turns x 10 each; Airex feet apart forward bending with cone pick up and turn to pass to therapist with feet together x multiple bouts; Tandem balance alternating forward LE x 30s with each foot forward; Airex balance beam side stepping in // bars x multiple lengths; Airex balance beam side stepping with horizontal head turns x multiple lengths; Airex tandem alternating forward LE with horizontal and vertical head turns x 30s each with both feet forward; Airex feet  together rebounder tosses x multiple bouts; Airex single leg balance rebounder tossess x multiple bouts; Forward gait in hallway with horizontal ball passes to therapist with head/eye follow 2 x 70' toward each side; Ascend/descend steps without UE support x 4 times;   HOME EXERCISE PROGRAM Access Code: [email or web address] URL: [email or web address] Date: 01/10/2022 Prepared by: Roxana Hires  Exercises - Seated Gaze Stabilization with Head Rotation  - 2 x daily - 7 x weekly - 3 reps - 60 seconds hold - Single Leg Stance with Support  - 2 x daily - 7 x weekly - 2 x 30s on each leg hold - Standing Tandem Balance with Counter Support  - 2 x daily - 7 x weekly - 2 x 30s on each leg hold - Side Stepping with Counter Support  - 2 x daily - 7 x weekly - 2 x 60s hold   ASSESSMENT:  CLINICAL IMPRESSION: Patient demonstrates excellent motivation during session today. He has now had an extended period without a Menire's attack. Progressed gaze stabilization and habituation exercises during session. Challenged pt outside across uneven grass while performing ball passes, side steps, and eyes closed gait. Plan is to extend his therapy sessions to every other week in October in anticipation of discharge if he continues to remain symptom-free. Pt encouraged to follow-up as scheduled. Pt will benefit from PT services to address deficits in balance and dizziness in order to return to full function at home.   REHAB POTENTIAL: Fair    CLINICAL DECISION MAKING: Unstable/unpredictable  EVALUATION COMPLEXITY: High   GOALS: Goals reviewed with patient? No  SHORT TERM GOALS: Target date: ACHIEVED  Pt will be independent with HEP for dizziness in order to decrease symptoms, improve balance,decrease fall risk, and improve function at home. Baseline: Goal status: ACHIEVED   LONG TERM GOALS: Target date: 03/14/22  Pt will increase FOTO to at least 53 to demonstrate significant improvement in  function at home related to dizziness.  Baseline: 11/23/21: 45; 01/03/22: 48; 01/17/22: 51 Goal status: PARTIALLY MET  2.  Pt will report a 50% reduction in his vertigo episodes in order to demonstrate clinically significant reduction in disability related to dizziness.  Baseline: 01/03/22: 30-40% improvement in frequency of vertigo episodes; 01/17/22: 50% improvement Goal status: ACHIEVED  3. Pt will improve DGI by at least 3 points in order to demonstrate clinically significant  improvement in balance and decreased risk for falls.     Baseline: 11/23/21: To be completed; 11/29/21: 21/24; 01/03/22: 21/24; 01/17/22: 23/24; Goal status: PARTIALLY MET   PLAN:  PT FREQUENCY: 1x/week  PT DURATION: 8 weeks  PLANNED INTERVENTIONS: Therapeutic exercises, Therapeutic activity, Neuromuscular re-education, Balance training, Gait training, Patient/Family education, Joint manipulation, Joint mobilization, Canalith repositioning, Aquatic Therapy, Dry Needling, Cognitive remediation, Electrical stimulation, Spinal manipulation, Spinal mobilization, Cryotherapy, Moist heat, Traction, Ultrasound, Ionotophoresis 30m/ml Dexamethasone, and Manual therapy  PLAN FOR NEXT SESSION: continue gaze stabilization progression, habituation exercises, balance exercises.   JLyndel SafeHuprich PT, DPT, GCS  [place]ola Key 02/19/2022, 2:52 PM

## 2022-02-22 ENCOUNTER — Ambulatory Visit
Admission: RE | Admit: 2022-02-22 | Discharge: 2022-02-22 | Disposition: A | Discharge status: Home / Self Care | Payer: Medicare Other | Source: Ambulatory Visit | Attending: Family Medicine | Admitting: Family Medicine

## 2022-02-22 DIAGNOSIS — I700 Atherosclerosis of aorta: Secondary | ICD-10-CM | POA: Diagnosis not present

## 2022-02-22 DIAGNOSIS — R634 Abnormal weight loss: Secondary | ICD-10-CM | POA: Diagnosis not present

## 2022-02-22 DIAGNOSIS — N289 Disorder of kidney and ureter, unspecified: Secondary | ICD-10-CM | POA: Diagnosis not present

## 2022-02-22 DIAGNOSIS — N134 Hydroureter: Secondary | ICD-10-CM | POA: Diagnosis not present

## 2022-02-22 MED ORDER — IOHEXOL 300 MG/ML  SOLN
100.0000 mL | Freq: Once | INTRAMUSCULAR | Status: AC | PRN
  Administered 2022-02-22: 100 mL via INTRAVENOUS

## 2022-02-23 LAB — COMPREHENSIVE METABOLIC PANEL
ALT: 16 IU/L (ref 0–44)
AST: 32 IU/L (ref 0–40)
Albumin/Globulin Ratio: 2 (ref 1.2–2.2)
Albumin: 4.3 g/dL (ref 3.8–4.8)
Alkaline Phosphatase: 70 IU/L (ref 44–121)
BUN/Creatinine Ratio: 14 (ref 10–24)
BUN: 20 mg/dL (ref 8–27)
Bilirubin Total: 0.7 mg/dL (ref 0.0–1.2)
CO2: 18 mmol/L — ABNORMAL LOW (ref 20–29)
Calcium: 9.1 mg/dL (ref 8.6–10.2)
Chloride: 87 mmol/L — ABNORMAL LOW (ref 96–106)
Creatinine, Ser: 1.38 mg/dL — ABNORMAL HIGH (ref 0.76–1.27)
Globulin, Total: 2.1 g/dL (ref 1.5–4.5)
Glucose: 78 mg/dL (ref 70–99)
Potassium: 4.3 mmol/L (ref 3.5–5.2)
Sodium: 124 mmol/L — ABNORMAL LOW (ref 134–144)
Total Protein: 6.4 g/dL (ref 6.0–8.5)
eGFR: 52 mL/min/{1.73_m2} — ABNORMAL LOW (ref >59)

## 2022-02-25 ENCOUNTER — Other Ambulatory Visit: Payer: Self-pay | Admitting: *Deleted

## 2022-02-25 DIAGNOSIS — E222 Syndrome of inappropriate secretion of antidiuretic hormone: Secondary | ICD-10-CM

## 2022-02-25 DIAGNOSIS — E871 Hypo-osmolality and hyponatremia: Secondary | ICD-10-CM

## 2022-02-25 DIAGNOSIS — I2510 Atherosclerotic heart disease of native coronary artery without angina pectoris: Secondary | ICD-10-CM

## 2022-02-25 DIAGNOSIS — R634 Abnormal weight loss: Secondary | ICD-10-CM

## 2022-02-28 ENCOUNTER — Ambulatory Visit: Payer: Medicare Other

## 2022-02-28 DIAGNOSIS — R42 Dizziness and giddiness: Secondary | ICD-10-CM | POA: Diagnosis not present

## 2022-02-28 NOTE — Therapy (Signed)
OUTPATIENT PHYSICAL THERAPY VESTIBULAR TREATMENT   Patient Name: KEAUN SCHNABEL MRN: [id number] DOB:05-03-1941, 81 y.o., male Today's Date: 03/01/2022  PCP: Jerrol Banana., MD REFERRING PROVIDER: Jerrol Banana.,*   PT End of Session - 02/28/22 1411     Visit Number 12    Number of Visits 17    Date for PT Re-Evaluation 03/14/22    Authorization Type eval: 11/23/21    PT Start Time 1400    PT Stop Time 1445    PT Time Calculation (min) 45 min    Equipment Utilized During Treatment Gait belt    Activity Tolerance Patient tolerated treatment well    Behavior During Therapy [facility] for tasks assessed/performed             Past Medical History:  Diagnosis Date   B12 deficiency anemia 08/03/2019   Hypercholesterolemia    Hypertension    Vertigo    last episode approx 09/2018   Past Surgical History:  Procedure Laterality Date   APPENDECTOMY     CATARACT EXTRACTION Bilateral    COLONOSCOPY WITH PROPOFOL N/A 12/24/2018   Procedure: COLONOSCOPY WITH PROPOFOL;  Surgeon: Lucilla Lame, MD;  Location: [facility] ENDOSCOPY;  Service: Endoscopy;  Laterality: N/A;   COLONOSCOPY WITH PROPOFOL N/A 12/13/2019   Procedure: COLONOSCOPY WITH BIOPSY ;  Surgeon: Lucilla Lame, MD;  Location: [place];  Service: Endoscopy;  Laterality: N/A;  priority 3   PLEURAL SCARIFICATION  05/2015   POLYPECTOMY N/A 12/13/2019   Procedure: POLYPECTOMY;  Surgeon: Lucilla Lame, MD;  Location: [place];  Service: Endoscopy;  Laterality: N/A;   Patient Active Problem List   Diagnosis Date Noted   Personal history of colonic polyps    B12 deficiency anemia 08/03/2019   Elevated MCV 08/03/2019   Cyst, baker's knee, right 07/27/2019   Peripheral edema- right lower leg  07/27/2019   Encounter for screening colonoscopy 04/12/2019   Polyp of transverse colon 04/12/2019   Coronary artery disease involving native coronary artery of native heart without angina pectoris 04/12/2019    Benign neoplasm of cecum 04/12/2019   Valvular heart disease 04/12/2019   Benign neoplasm of cecum    Pneumothorax on right 05/15/2015   Benign essential tremor 02/28/2015   Mixed hyperlipidemia 02/28/2015   Essential hypertension 02/28/2015   Hypercholesterolemia without hypertriglyceridemia 02/28/2015   Vertigo 02/28/2015    PCP: Jerrol Banana., MD  REFERRING PROVIDER: Jerrol Banana.,  REFERRING DIAGNOSIS: R42 (ICD-10-CM) - Vertigo   THERAPY DIAG: Dizziness and giddiness  RATIONALE FOR EVALUATION AND TREATMENT: Rehabilitation  ONSET DATE: 06/03/21 (approximate by patient however per medical record symptoms have been ongoing for years)  FOLLOW UP APPT WITH PROVIDER: Yes    SUBJECTIVE:   Chief Complaint:  Dizziness  Pertinent History Pt referred by PCP for dizziness. Per patient symptoms first started around January of this year when he started feeling "swimmy-headed." Based on the medical record from PCP it appearrs that these symptoms have been going on for multiple years. He was seen by Dr. Kathyrn Sheriff at [facility] and was diagnosed with Menire's Disease. Pt reports that he was prescribed Betahistine which does appar to have shortened the duration of the attacks. He reports approximately 8-10 Menire's attacks over the last 6 months. Pt states that the attacks can least anywhere from 3-4 hours to 2-3 days. Since the onset of symptoms pt has experienced R sided hearing loss and R sided tinnitus. He is not currently taking any  diuretics. He reports having three ED visits as well as three brain MRIs due to these symptoms. Most recent MRI report within our record is from 04/12/20 which showed no cerebellopontine angle or internal auditory canal lesion. Unremarkable appearance of the seventh and eighth cranial nerves bilaterally. No evidence of acute intracranial abnormality. Stable mild cerebral white matter chronic small vessel ischemic disease. Mild ethmoid and right  maxillary sinus mucosal thickening. Small left maxillary sinus mucous retention cyst. Pt has history of HTN, CAD, benign essential tremor, HLD and chronic RLL peripheral edema.  Description of dizziness: vertigo Frequency: 8-10 attacks over the last 6 motnhs Duration: 3-4 hours to multiple days Symptom nature: intermittent and spontaneous Progression of symptoms since onset: no change in severity but frequency has increased; History of similar episodes: No  Provocative Factors: Unknown Easing Factors: betahistine, wait for symptoms to pass  Auditory complaints (tinnitus, pain, drainage, hearing loss, aural fullness): Yes, R sided hearing loss and R tinntius, denies pain or aural fullness Vision changes (diplopia, visual field loss, recent changes, recent eye exam): No Chest pain/palpitations: No History of head injury/concussion: No Stress/anxiety: No Headaches/migraines: No history of migraines, pt reports bilateral headache during episodes, denies visual changes/floaters/photophobia/phonophbia Syncope: Denies syncope but does report some lightheadedness Numbness/tingling/focal weakness: No  Has patient fallen in last 6 months? Yes, Number of falls: 4, all related to vertigo episodes Pertinent pain: No Dominant hand: right Imaging: Yes  Prior level of function: Independent Occupational demands: Hobbies:  Red Flags: (dysarthria, dysphagia, drop attacks, bowel and bladder changes, recent weight loss/gain) Review of systems negative for red flags.   PRECAUTIONS: Fall  WEIGHT BEARING RESTRICTIONS No  LIVING ENVIRONMENT: Lives with: lives with their spouse Lives in: House/apartment Stairs: No;  Has following equipment at home: Environmental consultant - 4 wheeled, shower chair, and Grab bars  PATIENT GOALS  Decrease dizziness   OBJECTIVE  EXAMINATION  POSTURE: No gross deficits contributing to symptoms  NEUROLOGICAL SCREEN: (2+ unless otherwise noted.) N=normal  Ab=abnormal  Level  Dermatome R L Myotome R L Reflex R L  C3 Anterior Neck N N Sidebend C2-3 N N Jaw CN V    C4 Top of Shoulder N N Shoulder Shrug C4 N N Hoffman's UMN    C5 Lateral Upper Arm N N Shoulder ABD C4-5 N N Biceps C5-6    C6 Lateral Arm/ Thumb N N Arm Flex/ Wrist Ext C5-6 N N Brachiorad. C5-6    C7 Middle Finger N N Arm Ext//Wrist Flex C6-7 N N Triceps C7    C8 4th & 5th Finger N N Flex/ Ext Carpi Ulnaris C8 N N Patellar (L3-4)    T1 Medial Arm N N Interossei T1 N N Gastrocnemius    L2 Medial thigh/groin N N Illiopsoas (L2-3) N N     L3 Lower thigh/med.knee N N Quadriceps (L3-4) N N     L4 Medial leg/lat thigh N N Tibialis Ant (L4-5) N N     L5 Lat. leg & dorsal foot N N EHL (L5) N N     S1 post/lat foot/thigh/leg N N Gastrocnemius (S1-2) N N     S2 Post./med. thigh & leg N N Hamstrings (L4-S3) N N       CRANIAL NERVES II, III, IV, VI: Pupils equal and reactive to light, visual acuity and visual fields are intact, Pt with notable loss of vertical gaze bilaterally  but otherwise extraocular muscles are intact  V: Facial sensation is intact and symmetric bilaterally  VII: Facial  strength is intact and symmetric bilaterally  VIII: Hearing is normal as tested by gross conversation IX, X: Palate elevates midline, normal phonation, uvula midline XI: Shoulder shrug strength is intact  XII: Tongue protrudes midline   SOMATOSENSORY Deferred  COORDINATION Finger to Nose: Normal with the exception of BUE tremors Heel to Shin: Normal Pronator Drift: Negative Rapid Alternating Movements: Normal Finger to Thumb Opposition: Difficult for patient bilaterally secondary to tremor  Resting tremor noted BUE, worse with movement   RANGE OF MOTION Cervical Spine AROM with mild to moderate loss but painless in all planes. No focal deficits in AROM noted in [place] strength WNL without focal deficits  TRANSFERS/GAIT Independent for transfers and ambulation without assistive  device   PATIENT SURVEYS FOTO: 45,predicted improvement to 53  ABC: 78.1% DHI: 4/100  POSTURAL CONTROL TESTS  Clinical Test of Sensory Interaction for Balance (CTSIB): Deferred   OCULOMOTOR / VESTIBULAR TESTING  Oculomotor Exam- Room Light  Findings Comments  Ocular Alignment normal   Ocular ROM abnormal Decreased vertical gaze in both eyes  Spontaneous Nystagmus abnormal L beating pure horizontal nystagmus, does not fatigue  Gaze-Holding Nystagmus normal 2nd degree L beating pure horizontal nystagmus  End-Gaze Nystagmus normal See above  Vergence (normal 2-3") not examined   Smooth Pursuit normal   Cross-Cover Test not examined   Saccades abnormal Slow and hypometric  VOR Cancellation normal   Left Head Impulse not examined Pt unable to relax head/neck so unable to perform  Right Head Impulse not examined See above  Static Acuity not examined   Dynamic Acuity not examined     Oculomotor Exam- Fixation Suppressed  Findings Comments  Ocular Alignment normal   Spontaneous Nystagmus abnormal L beating pure horizontal nystagmus, does not fatigue  Gaze-Holding Nystagmus abnormal 2nd degree L beating pure horizontal nystagmus  End-Gaze Nystagmus abnormal See above  Head Shaking Nystagmus abnormal Slight increase in frequency of pure horizontal L beating nystagmus  Pressure-Induced Nystagmus not examined   Hyperventilation Induced Nystagmus not examined   Skull Vibration Induced Nystagmus not examined     BPPV TESTS:  Symptoms Duration Intensity Nystagmus  L Dix-Hallpike None   None  R Dix-Hallpike None   None  L Head Roll None   None  R Head Roll None   None  L Sidelying Test      R Sidelying Test      (blank = not tested)   FUNCTIONAL OUTCOME MEASURES: Deferred    TODAY'S TREATMENT   SUBJECTIVE: Pt reports that he is doing well today. Denies pain upon arrival. No significant changes since the last therapy session. No Meneire's attacks since the last therapy  session. No specific questions or concerns.   PAIN: Denies   Neuromuscular Re-education  NuStep L2-4 x 6 minutes for warm-up during interval history (3 minutes unbilled); Tandem balance alternating forward LE x 30s with each foot forward; Tandem balance alternating forward LE with horizontal and vertical head turns x 30s each with each foot forward; Airex balance beam side stepping in // bars x multiple lengths; Airex balance beam side stepping with horizontal head turns x multiple lengths; Forward gait in hallway with vertical ball lifts with head/eye follow 4 x 70'; Forward gait in hallway with horizontal ball passes between hands with head/eye follow 4 x 70'; Forward gait in hallway with horizontal ball passes to therapist with head/eye follow 2 x 70' toward each side;   Not performed: 6" step-ups from Airex pad  to Airex pad on top of step x 10 leading with each LE; Airex feet together eyes closed x 30s; Airex feet together vertical head turns x 10 each; Airex feet apart forward bending with cone pick up and turn to pass to therapist with feet together x multiple bouts; Airex tandem alternating forward LE with horizontal and vertical head turns x 30s each with both feet forward; Airex feet together rebounder tosses x multiple bouts; Airex single leg balance rebounder tossess x multiple bouts; Ascend/descend steps without UE support x 4 times; Forward gait outside across grass with horizontal ball passes between hands with head/eye follow 2 x 70' each; Forward gait outside across grass with eyes closed 2 x 70'; Cross-over side stepping outside across grass x 50' toward each side; Tandem gait 15' x 4; VOR x 1 horizontal and vertical during forward/retro ambulation in hallway 2 x 70' each, pt requires heavy tactile cues to maintain speed of head turns and still struggles to turn head fast enough, no dizziness; Airex alternating cone taps on top of 6" step x 10 BLE; Airex feet together  with ball passes around body with therapist providing return pass on opposite side varying height x multiple bouts on each side; Sit to stand from regular height chair without UE support and Airex pad under feet, horizontal head turns right and left during standing x 10;   HOME EXERCISE PROGRAM Access Code: [email or web address] URL: [email or web address] Date: 01/10/2022 Prepared by: Roxana Hires  Exercises - Seated Gaze Stabilization with Head Rotation  - 2 x daily - 7 x weekly - 3 reps - 60 seconds hold - Single Leg Stance with Support  - 2 x daily - 7 x weekly - 2 x 30s on each leg hold - Standing Tandem Balance with Counter Support  - 2 x daily - 7 x weekly - 2 x 30s on each leg hold - Side Stepping with Counter Support  - 2 x daily - 7 x weekly - 2 x 60s hold   ASSESSMENT:  CLINICAL IMPRESSION: Patient demonstrates excellent motivation during session today. He has continued with an extended period without a Menire's attack. Progressed gaze stabilization and habituation exercises during session. Plan is to continue his therapy sessions to every other week in October in anticipation of discharge if he continues to remain symptom-free. Pt encouraged to follow-up as scheduled. Pt will benefit from PT services to address deficits in balance and dizziness in order to return to full function at home.   REHAB POTENTIAL: Fair    CLINICAL DECISION MAKING: Unstable/unpredictable  EVALUATION COMPLEXITY: High   GOALS: Goals reviewed with patient? No  SHORT TERM GOALS: Target date: ACHIEVED  Pt will be independent with HEP for dizziness in order to decrease symptoms, improve balance,decrease fall risk, and improve function at home. Baseline: Goal status: ACHIEVED   LONG TERM GOALS: Target date: 03/14/22  Pt will increase FOTO to at least 53 to demonstrate significant improvement in function at home related to dizziness.  Baseline: 11/23/21: 45; 01/03/22: 48; 01/17/22: 51 Goal  status: PARTIALLY MET  2.  Pt will report a 50% reduction in his vertigo episodes in order to demonstrate clinically significant reduction in disability related to dizziness.  Baseline: 01/03/22: 30-40% improvement in frequency of vertigo episodes; 01/17/22: 50% improvement Goal status: ACHIEVED  3. Pt will improve DGI by at least 3 points in order to demonstrate clinically significant improvement in balance and decreased risk for falls.     Baseline: 11/23/21: To be  completed; 11/29/21: 21/24; 01/03/22: 21/24; 01/17/22: 23/24; Goal status: PARTIALLY MET   PLAN:  PT FREQUENCY: 1x/week  PT DURATION: 8 weeks  PLANNED INTERVENTIONS: Therapeutic exercises, Therapeutic activity, Neuromuscular re-education, Balance training, Gait training, Patient/Family education, Joint manipulation, Joint mobilization, Canalith repositioning, Aquatic Therapy, Dry Needling, Cognitive remediation, Electrical stimulation, Spinal manipulation, Spinal mobilization, Cryotherapy, Moist heat, Traction, Ultrasound, Ionotophoresis 4mg /ml Dexamethasone, and Manual therapy  PLAN FOR NEXT SESSION: continue gaze stabilization progression, habituation exercises, balance exercises.   Lyndel Safe Luken Shadowens PT, DPT, GCS  Arrin Ishler 03/01/2022, 10:00 AM

## 2022-03-01 DIAGNOSIS — Z23 Encounter for immunization: Secondary | ICD-10-CM | POA: Diagnosis not present

## 2022-03-04 ENCOUNTER — Ambulatory Visit
Admission: RE | Admit: 2022-03-04 | Discharge: 2022-03-04 | Disposition: A | Discharge status: Home / Self Care | Payer: Medicare Other | Source: Ambulatory Visit | Attending: Family Medicine | Admitting: Family Medicine

## 2022-03-04 DIAGNOSIS — E871 Hypo-osmolality and hyponatremia: Secondary | ICD-10-CM | POA: Diagnosis not present

## 2022-03-04 DIAGNOSIS — I700 Atherosclerosis of aorta: Secondary | ICD-10-CM | POA: Diagnosis not present

## 2022-03-04 DIAGNOSIS — R634 Abnormal weight loss: Secondary | ICD-10-CM | POA: Diagnosis not present

## 2022-03-04 DIAGNOSIS — I2510 Atherosclerotic heart disease of native coronary artery without angina pectoris: Secondary | ICD-10-CM | POA: Insufficient documentation

## 2022-03-04 DIAGNOSIS — R918 Other nonspecific abnormal finding of lung field: Secondary | ICD-10-CM | POA: Diagnosis not present

## 2022-03-04 DIAGNOSIS — K449 Diaphragmatic hernia without obstruction or gangrene: Secondary | ICD-10-CM | POA: Insufficient documentation

## 2022-03-04 DIAGNOSIS — E222 Syndrome of inappropriate secretion of antidiuretic hormone: Secondary | ICD-10-CM | POA: Diagnosis not present

## 2022-03-04 MED ORDER — IOHEXOL 300 MG/ML  SOLN
75.0000 mL | Freq: Once | INTRAMUSCULAR | Status: AC | PRN
  Administered 2022-03-04: 75 mL via INTRAVENOUS

## 2022-03-05 ENCOUNTER — Ambulatory Visit: Payer: Self-pay

## 2022-03-05 NOTE — Telephone Encounter (Signed)
Pt called for imaging results.    Richard Maceo Pro., MD  02/28/2022  6:10 PM EDT     CT oki--repeat 12 months. Please advise patient.

## 2022-03-06 LAB — RENAL FUNCTION PANEL
Albumin: 4.2 g/dL (ref 3.8–4.8)
BUN/Creatinine Ratio: 10 (ref 10–24)
BUN: 14 mg/dL (ref 8–27)
CO2: 21 mmol/L (ref 20–29)
Calcium: 9.1 mg/dL (ref 8.6–10.2)
Chloride: 95 mmol/L — ABNORMAL LOW (ref 96–106)
Creatinine, Ser: 1.41 mg/dL — ABNORMAL HIGH (ref 0.76–1.27)
Glucose: 72 mg/dL (ref 70–99)
Phosphorus: 3.7 mg/dL (ref 2.8–4.1)
Potassium: 4.4 mmol/L (ref 3.5–5.2)
Sodium: 132 mmol/L — ABNORMAL LOW (ref 134–144)
eGFR: 51 mL/min/{1.73_m2} — ABNORMAL LOW (ref >59)

## 2022-03-06 LAB — OSMOLALITY, URINE: Osmolality, Ur: 244 mOsmol/kg

## 2022-03-06 LAB — OSMOLALITY: Osmolality Meas: 264 mOsmol/kg — ABNORMAL LOW (ref 280–301)

## 2022-03-13 ENCOUNTER — Encounter: Payer: Self-pay | Admitting: Family Medicine

## 2022-03-13 DIAGNOSIS — I712 Thoracic aortic aneurysm, without rupture, unspecified: Secondary | ICD-10-CM | POA: Insufficient documentation

## 2022-03-13 DIAGNOSIS — R911 Solitary pulmonary nodule: Secondary | ICD-10-CM | POA: Insufficient documentation

## 2022-03-13 NOTE — Therapy (Signed)
OUTPATIENT PHYSICAL THERAPY VESTIBULAR TREATMENT/DISCHARGE   Patient Name: Zachary [name] MRN: [id number] DOB:[birth year], 81 y.o., male Today's Date: 03/14/2022  PCP: Jerrol Banana., MD REFERRING PROVIDER: Birdie Sons, MD   PT End of Session - 03/14/22 1407     Visit Number 13    Number of Visits 17    Date for PT Re-Evaluation 03/14/22    Authorization Type eval: 11/23/21    PT Start Time 1401    PT Stop Time 1445    PT Time Calculation (min) 44 min    Equipment Utilized During Treatment Gait belt    Activity Tolerance Patient tolerated treatment well    Behavior During Therapy [facility] for tasks assessed/performed            Past Medical History:  Diagnosis Date   B12 deficiency anemia 08/03/2019   Hypercholesterolemia    Hypertension    Vertigo    last episode approx 09/2018   Past Surgical History:  Procedure Laterality Date   APPENDECTOMY     CATARACT EXTRACTION Bilateral    COLONOSCOPY WITH PROPOFOL N/A 12/24/2018   Procedure: COLONOSCOPY WITH PROPOFOL;  Surgeon: Lucilla Lame, MD;  Location: [facility] ENDOSCOPY;  Service: Endoscopy;  Laterality: N/A;   COLONOSCOPY WITH PROPOFOL N/A 12/13/2019   Procedure: COLONOSCOPY WITH BIOPSY ;  Surgeon: Lucilla Lame, MD;  Location: [place];  Service: Endoscopy;  Laterality: N/A;  priority 3   PLEURAL SCARIFICATION  05/2015   POLYPECTOMY N/A 12/13/2019   Procedure: POLYPECTOMY;  Surgeon: Lucilla Lame, MD;  Location: [place];  Service: Endoscopy;  Laterality: N/A;   Patient Active Problem List   Diagnosis Date Noted   Aneurysm of thoracic aorta ([place]) 03/13/2022   Nodule of left lung 03/13/2022   Personal history of colonic polyps    B12 deficiency anemia 08/03/2019   Elevated MCV 08/03/2019   Cyst, baker's knee, right 07/27/2019   Peripheral edema- right lower leg  07/27/2019   Coronary artery disease involving native coronary artery of native heart without angina pectoris 04/12/2019    Valvular heart disease 04/12/2019   Pneumothorax on right 05/15/2015   Benign essential tremor 02/28/2015   Mixed hyperlipidemia 02/28/2015   Essential hypertension 02/28/2015   Hypercholesterolemia without hypertriglyceridemia 02/28/2015   Vertigo 02/28/2015    PCP: Jerrol Banana., MD  REFERRING PROVIDER: Jerrol Banana.,  REFERRING DIAGNOSIS: R42 (ICD-10-CM) - Vertigo   THERAPY DIAG: Dizziness and giddiness  RATIONALE FOR EVALUATION AND TREATMENT: Rehabilitation  ONSET DATE: 06/03/21 (approximate by patient however per medical record symptoms have been ongoing for years)  FOLLOW UP APPT WITH PROVIDER: Yes    SUBJECTIVE:   Chief Complaint:  Dizziness  Pertinent History Pt referred by PCP for dizziness. Per patient symptoms first started around January of this year when he started feeling "swimmy-headed." Based on the medical record from PCP it appearrs that these symptoms have been going on for multiple years. He was seen by Dr. Kathyrn Sheriff at [facility] and was diagnosed with Menire's Disease. Pt reports that he was prescribed Betahistine which does appar to have shortened the duration of the attacks. He reports approximately 8-10 Menire's attacks over the last 6 months. Pt states that the attacks can least anywhere from 3-4 hours to 2-3 days. Since the onset of symptoms pt has experienced R sided hearing loss and R sided tinnitus. He is not currently taking any diuretics. He reports having three ED visits as well as three brain MRIs due  to these symptoms. Most recent MRI report within our record is from 04/12/20 which showed no cerebellopontine angle or internal auditory canal lesion. Unremarkable appearance of the seventh and eighth cranial nerves bilaterally. No evidence of acute intracranial abnormality. Stable mild cerebral white matter chronic small vessel ischemic disease. Mild ethmoid and right maxillary sinus mucosal thickening. Small left maxillary sinus mucous  retention cyst. Pt has history of HTN, CAD, benign essential tremor, HLD and chronic RLL peripheral edema.  Description of dizziness: vertigo Frequency: 8-10 attacks over the last 6 motnhs Duration: 3-4 hours to multiple days Symptom nature: intermittent and spontaneous Progression of symptoms since onset: no change in severity but frequency has increased; History of similar episodes: No  Provocative Factors: Unknown Easing Factors: betahistine, wait for symptoms to pass  Auditory complaints (tinnitus, pain, drainage, hearing loss, aural fullness): Yes, R sided hearing loss and R tinntius, denies pain or aural fullness Vision changes (diplopia, visual field loss, recent changes, recent eye exam): No Chest pain/palpitations: No History of head injury/concussion: No Stress/anxiety: No Headaches/migraines: No history of migraines, pt reports bilateral headache during episodes, denies visual changes/floaters/photophobia/phonophbia Syncope: Denies syncope but does report some lightheadedness Numbness/tingling/focal weakness: No  Has patient fallen in last 6 months? Yes, Number of falls: 4, all related to vertigo episodes Pertinent pain: No Dominant hand: right Imaging: Yes  Prior level of function: Independent Occupational demands: Hobbies:  Red Flags: (dysarthria, dysphagia, drop attacks, bowel and bladder changes, recent weight loss/gain) Review of systems negative for red flags.   PRECAUTIONS: Fall  WEIGHT BEARING RESTRICTIONS No  LIVING ENVIRONMENT: Lives with: lives with their spouse Lives in: House/apartment Stairs: No;  Has following equipment at home: Environmental consultant - 4 wheeled, shower chair, and Grab bars  PATIENT GOALS  Decrease dizziness   OBJECTIVE  EXAMINATION  POSTURE: No gross deficits contributing to symptoms  NEUROLOGICAL SCREEN: (2+ unless otherwise noted.) N=normal  Ab=abnormal  Level Dermatome R L Myotome R L Reflex R L  C3 Anterior Neck N N Sidebend C2-3 N  N Jaw CN V    C4 Top of Shoulder N N Shoulder Shrug C4 N N Hoffman's UMN    C5 Lateral Upper Arm N N Shoulder ABD C4-5 N N Biceps C5-6    C6 Lateral Arm/ Thumb N N Arm Flex/ Wrist Ext C5-6 N N Brachiorad. C5-6    C7 Middle Finger N N Arm Ext//Wrist Flex C6-7 N N Triceps C7    C8 4th & 5th Finger N N Flex/ Ext Carpi Ulnaris C8 N N Patellar (L3-4)    T1 Medial Arm N N Interossei T1 N N Gastrocnemius    L2 Medial thigh/groin N N Illiopsoas (L2-3) N N     L3 Lower thigh/med.knee N N Quadriceps (L3-4) N N     L4 Medial leg/lat thigh N N Tibialis Ant (L4-5) N N     L5 Lat. leg & dorsal foot N N EHL (L5) N N     S1 post/lat foot/thigh/leg N N Gastrocnemius (S1-2) N N     S2 Post./med. thigh & leg N N Hamstrings (L4-S3) N N       CRANIAL NERVES II, III, IV, VI: Pupils equal and reactive to light, visual acuity and visual fields are intact, Pt with notable loss of vertical gaze bilaterally  but otherwise extraocular muscles are intact  V: Facial sensation is intact and symmetric bilaterally  VII: Facial strength is intact and symmetric bilaterally  VIII: Hearing is normal as tested by  gross conversation IX, X: Palate elevates midline, normal phonation, uvula midline XI: Shoulder shrug strength is intact  XII: Tongue protrudes midline   SOMATOSENSORY Deferred  COORDINATION Finger to Nose: Normal with the exception of BUE tremors Heel to Shin: Normal Pronator Drift: Negative Rapid Alternating Movements: Normal Finger to Thumb Opposition: Difficult for patient bilaterally secondary to tremor  Resting tremor noted BUE, worse with movement   RANGE OF MOTION Cervical Spine AROM with mild to moderate loss but painless in all planes. No focal deficits in AROM noted in [place] strength WNL without focal deficits  TRANSFERS/GAIT Independent for transfers and ambulation without assistive device   PATIENT SURVEYS FOTO: 45,predicted improvement to 53  ABC:  78.1% DHI: 4/100  POSTURAL CONTROL TESTS  Clinical Test of Sensory Interaction for Balance (CTSIB): Deferred   OCULOMOTOR / VESTIBULAR TESTING  Oculomotor Exam- Room Light  Findings Comments  Ocular Alignment normal   Ocular ROM abnormal Decreased vertical gaze in both eyes  Spontaneous Nystagmus abnormal L beating pure horizontal nystagmus, does not fatigue  Gaze-Holding Nystagmus normal 2nd degree L beating pure horizontal nystagmus  End-Gaze Nystagmus normal See above  Vergence (normal 2-3") not examined   Smooth Pursuit normal   Cross-Cover Test not examined   Saccades abnormal Slow and hypometric  VOR Cancellation normal   Left Head Impulse not examined Pt unable to relax head/neck so unable to perform  Right Head Impulse not examined See above  Static Acuity not examined   Dynamic Acuity not examined     Oculomotor Exam- Fixation Suppressed  Findings Comments  Ocular Alignment normal   Spontaneous Nystagmus abnormal L beating pure horizontal nystagmus, does not fatigue  Gaze-Holding Nystagmus abnormal 2nd degree L beating pure horizontal nystagmus  End-Gaze Nystagmus abnormal See above  Head Shaking Nystagmus abnormal Slight increase in frequency of pure horizontal L beating nystagmus  Pressure-Induced Nystagmus not examined   Hyperventilation Induced Nystagmus not examined   Skull Vibration Induced Nystagmus not examined     BPPV TESTS:  Symptoms Duration Intensity Nystagmus  L Dix-Hallpike None   None  R Dix-Hallpike None   None  L Head Roll None   None  R Head Roll None   None  L Sidelying Test      R Sidelying Test      (blank = not tested)   FUNCTIONAL OUTCOME MEASURES: Deferred    TODAY'S TREATMENT   SUBJECTIVE: Pt reports that he is doing well today. Denies pain upon arrival. No significant changes since the last therapy session. No Meneire's attacks since the last therapy session. Reports 75-80% improvement in symptoms since starting with  therapy. No specific questions or concerns.   PAIN: Denies   Neuromuscular Re-education  NuStep L2-5 x 7 minutes for warm-up during interval history (4 minutes unbilled);  Updated outcome measures with patient: FOTO: 62 DGI: 24/24 % improvement: 75-80%  Tandem balance alternating forward LE x 30s with each foot forward; Tandem balance alternating forward LE with horizontal and vertical head turns x 30s each with each foot forward; Airex feet together with ball passes around body with therapist providing return pass on opposite side varying height x multiple bouts on each side; Forward/Retro gait in hallway with vertical ball lifts with head/eye follow 2 x 70' each; Forward/Retro gait in hallway with horizontal ball passes between hands with head/eye follow 2 x 70' each; Forward gait in hallway with horizontal ball passes to therapist with head/eye follow x 70' toward  each side; HEP reviewed with patient;   HOME EXERCISE PROGRAM Access Code: [email or web address] URL: [email or web address] Date: 01/10/2022 Prepared by: Roxana Hires  Exercises - Seated Gaze Stabilization with Head Rotation  - 2 x daily - 7 x weekly - 3 reps - 60 seconds hold - Single Leg Stance with Support  - 2 x daily - 7 x weekly - 2 x 30s on each leg hold - Standing Tandem Balance with Counter Support  - 2 x daily - 7 x weekly - 2 x 30s on each leg hold - Side Stepping with Counter Support  - 2 x daily - 7 x weekly - 2 x 60s hold   ASSESSMENT:  CLINICAL IMPRESSION: Patient demonstrates excellent motivation during session today. Updated outcome measures with patient during appointment. His DGI has improved from 21/24 to 24/24 and his FOTO score improved from 45 at initial evaluation to 62 today. Overall he reports approximately 75-80% improvement in the frequency of vertigo episodes since starting with therapy. He has not gone for over two months without a Menire's attack and has started driving  intermittently again. After updating goals continued with dynamic habituation exercises such as ball passes with head turns during gait. Also utilized narrow stance positions to challenge balance. Pt has met all of his goals for therapy and will be discharged on this date. Pt encouraged to call for follow-up if symptoms recur or worsen.  REHAB POTENTIAL: Fair    CLINICAL DECISION MAKING: Unstable/unpredictable  EVALUATION COMPLEXITY: High   GOALS: Goals reviewed with patient? No  SHORT TERM GOALS: Target date: ACHIEVED  Pt will be independent with HEP for dizziness in order to decrease symptoms, improve balance,decrease fall risk, and improve function at home. Baseline: Goal status: ACHIEVED   LONG TERM GOALS: Target date: 03/14/22  Pt will increase FOTO to at least 53 to demonstrate significant improvement in function at home related to dizziness.  Baseline: 11/23/21: 45; 01/03/22: 48; 01/17/22: 51; 03/14/22: 62 Goal status: ACHIEVED  2.  Pt will report a 50% reduction in his vertigo episodes in order to demonstrate clinically significant reduction in disability related to dizziness.  Baseline: 01/03/22: 30-40% improvement in frequency of vertigo episodes; 01/17/22: 50% improvement Goal status: ACHIEVED  3. Pt will improve DGI by at least 3 points in order to demonstrate clinically significant improvement in balance and decreased risk for falls.     Baseline: 11/23/21: To be completed; 11/29/21: 21/24; 01/03/22: 21/24; 01/17/22: 23/24; 03/14/22: 24/24 Goal status: ACHIEVED   PLAN:  PT FREQUENCY: 1x/week  PT DURATION: 8 weeks  PLANNED INTERVENTIONS: Therapeutic exercises, Therapeutic activity, Neuromuscular re-education, Balance training, Gait training, Patient/Family education, Joint manipulation, Joint mobilization, Canalith repositioning, Aquatic Therapy, Dry Needling, Cognitive remediation, Electrical stimulation, Spinal manipulation, Spinal mobilization, Cryotherapy, Moist heat,  Traction, Ultrasound, Ionotophoresis 70m/ml Dexamethasone, and Manual therapy  PLAN FOR NEXT SESSION: Discharge   JLyndel SafeHuprich PT, DPT, GCS  Derrious Bologna 03/14/2022, 2:54 PM

## 2022-03-14 ENCOUNTER — Ambulatory Visit: Payer: Medicare Other | Attending: Family Medicine

## 2022-03-14 DIAGNOSIS — R42 Dizziness and giddiness: Secondary | ICD-10-CM

## 2022-03-15 ENCOUNTER — Telehealth: Payer: Self-pay

## 2022-03-15 NOTE — Telephone Encounter (Signed)
Pt returned our call. Shared provider's notes with pt.  Pt will rtc for lab work next month.     Birdie Sons, MD  03/13/2022  1:46 PM EDT     No sign of any type of cancer on the chest CT. There is a small nodule on his left lung that appear benign. This needs to be rechecked in 6 months and a small aortic aneurysm that needs to be checked in 1 year.    Eulis Foster, MD  03/08/2022  4:10 PM EDT     Urine and serum studies appear to be consistent with hormonal abnormality    Patient will need more confirmatory testing for this diagnosis.    Recommend rechecking sodium levels within the next month.

## 2022-03-15 NOTE — Telephone Encounter (Signed)
Pt states he will be following Dr. Rosanna Randy to new practice.

## 2022-03-20 DIAGNOSIS — D235 Other benign neoplasm of skin of trunk: Secondary | ICD-10-CM | POA: Diagnosis not present

## 2022-03-20 DIAGNOSIS — L988 Other specified disorders of the skin and subcutaneous tissue: Secondary | ICD-10-CM | POA: Diagnosis not present

## 2022-03-27 DIAGNOSIS — I38 Endocarditis, valve unspecified: Secondary | ICD-10-CM | POA: Diagnosis not present

## 2022-03-27 DIAGNOSIS — I10 Essential (primary) hypertension: Secondary | ICD-10-CM | POA: Diagnosis not present

## 2022-03-27 DIAGNOSIS — R609 Edema, unspecified: Secondary | ICD-10-CM | POA: Diagnosis not present

## 2022-03-27 DIAGNOSIS — E782 Mixed hyperlipidemia: Secondary | ICD-10-CM | POA: Diagnosis not present

## 2022-03-27 DIAGNOSIS — I2510 Atherosclerotic heart disease of native coronary artery without angina pectoris: Secondary | ICD-10-CM | POA: Diagnosis not present

## 2022-04-11 ENCOUNTER — Encounter: Payer: Medicare Other | Admitting: Family Medicine

## 2022-04-16 DIAGNOSIS — Z86018 Personal history of other benign neoplasm: Secondary | ICD-10-CM | POA: Diagnosis not present

## 2022-04-16 DIAGNOSIS — L570 Actinic keratosis: Secondary | ICD-10-CM | POA: Diagnosis not present

## 2022-04-16 DIAGNOSIS — D485 Neoplasm of uncertain behavior of skin: Secondary | ICD-10-CM | POA: Diagnosis not present

## 2022-05-21 DIAGNOSIS — L570 Actinic keratosis: Secondary | ICD-10-CM | POA: Diagnosis not present

## 2022-06-27 DIAGNOSIS — H8103 Meniere's disease, bilateral: Secondary | ICD-10-CM | POA: Diagnosis not present

## 2022-06-27 DIAGNOSIS — I2510 Atherosclerotic heart disease of native coronary artery without angina pectoris: Secondary | ICD-10-CM | POA: Diagnosis not present

## 2022-06-27 DIAGNOSIS — R634 Abnormal weight loss: Secondary | ICD-10-CM | POA: Diagnosis not present

## 2022-08-05 DIAGNOSIS — H90A21 Sensorineural hearing loss, unilateral, right ear, with restricted hearing on the contralateral side: Secondary | ICD-10-CM | POA: Diagnosis not present

## 2022-08-05 DIAGNOSIS — H8101 Meniere's disease, right ear: Secondary | ICD-10-CM | POA: Diagnosis not present

## 2022-09-06 DIAGNOSIS — M79604 Pain in right leg: Secondary | ICD-10-CM | POA: Diagnosis not present

## 2022-09-06 DIAGNOSIS — S8011XA Contusion of right lower leg, initial encounter: Secondary | ICD-10-CM | POA: Diagnosis not present

## 2023-07-11 DIAGNOSIS — I129 Hypertensive chronic kidney disease with stage 1 through stage 4 chronic kidney disease, or unspecified chronic kidney disease: Secondary | ICD-10-CM | POA: Diagnosis not present

## 2023-07-11 DIAGNOSIS — H8103 Meniere's disease, bilateral: Secondary | ICD-10-CM | POA: Diagnosis not present

## 2023-07-11 DIAGNOSIS — E871 Hypo-osmolality and hyponatremia: Secondary | ICD-10-CM | POA: Diagnosis not present

## 2023-07-11 DIAGNOSIS — N1832 Chronic kidney disease, stage 3b: Secondary | ICD-10-CM | POA: Diagnosis not present

## 2023-07-11 DIAGNOSIS — D519 Vitamin B12 deficiency anemia, unspecified: Secondary | ICD-10-CM | POA: Diagnosis not present

## 2023-07-11 DIAGNOSIS — R634 Abnormal weight loss: Secondary | ICD-10-CM | POA: Diagnosis not present

## 2023-07-25 ENCOUNTER — Ambulatory Visit
Admission: EM | Admit: 2023-07-25 | Discharge: 2023-07-25 | Disposition: A | Discharge status: Home / Self Care | Payer: Medicare HMO | Attending: Emergency Medicine | Admitting: Emergency Medicine

## 2023-07-25 ENCOUNTER — Encounter: Payer: Self-pay | Admitting: Emergency Medicine

## 2023-07-25 DIAGNOSIS — N3091 Cystitis, unspecified with hematuria: Secondary | ICD-10-CM | POA: Diagnosis not present

## 2023-07-25 LAB — URINALYSIS, W/ REFLEX TO CULTURE (INFECTION SUSPECTED)
Glucose, UA: NEGATIVE mg/dL
Ketones, ur: NEGATIVE mg/dL
Leukocytes,Ua: NEGATIVE
Nitrite: NEGATIVE
Protein, ur: 100 mg/dL — AB
RBC / HPF: >50 RBC/hpf (ref 0–5)
Specific Gravity, Urine: 1.015 (ref 1.005–1.030)
pH: 5.5 (ref 5.0–8.0)

## 2023-07-25 MED ORDER — PHENAZOPYRIDINE HCL 200 MG PO TABS
200.0000 mg | ORAL_TABLET | Freq: Three times a day (TID) | ORAL | 0 refills | Status: DC
Start: 2023-07-25 — End: 2023-08-08

## 2023-07-25 MED ORDER — CIPROFLOXACIN HCL 500 MG PO TABS: 500.0000 mg | ORAL_TABLET | Freq: Two times a day (BID) | ORAL | 0 refills | Status: AC

## 2023-07-25 NOTE — ED Provider Notes (Signed)
 [facility]    CSN: [id number] Arrival date & time: 07/25/23  1113      History   Chief Complaint Chief Complaint  Patient presents with   Abdominal Pain   Hematuria    HPI JAMIN PANTHER is a 83 y.o. male.   HPI  83 year old male with past medical history significant for vertigo, hypertension, hypercholesterolemia, and thoracic aortic aneurysm presents for evaluation of urinary symptoms that started this morning.  He reports that over the last couple days he has had some suprapubic discomfort that he is attributed to sitting in a lounge chair.  This morning he woke up in the had 3 episodes of voiding blood with some burning with urination along with urinary urgency and frequency.  He denies any fever, back pain, nausea or vomiting.  No history of UTIs, prostate issues, or kidney stones.  Past Medical History:  Diagnosis Date   B12 deficiency anemia 08/03/2019   Hypercholesterolemia    Hypertension    Vertigo    last episode approx 09/2018    Patient Active Problem List   Diagnosis Date Noted   Aneurysm of thoracic aorta ([facility]) 03/13/2022   Nodule of left lung 03/13/2022   History of colonic polyps    B12 deficiency anemia 08/03/2019   Elevated MCV 08/03/2019   Cyst, baker's knee, right 07/27/2019   Peripheral edema- right lower leg  07/27/2019   Coronary artery disease involving native coronary artery of native heart without angina pectoris 04/12/2019   Valvular heart disease 04/12/2019   Pneumothorax on right 05/15/2015   Benign essential tremor 02/28/2015   Mixed hyperlipidemia 02/28/2015   Essential hypertension 02/28/2015   Hypercholesterolemia without hypertriglyceridemia 02/28/2015   Vertigo 02/28/2015    Past Surgical History:  Procedure Laterality Date   APPENDECTOMY     CATARACT EXTRACTION Bilateral    COLONOSCOPY WITH PROPOFOL N/A 12/24/2018   Procedure: COLONOSCOPY WITH PROPOFOL;  Surgeon: Midge Minium, MD;  Location: [facility] ENDOSCOPY;   Service: Endoscopy;  Laterality: N/A;   COLONOSCOPY WITH PROPOFOL N/A 12/13/2019   Procedure: COLONOSCOPY WITH BIOPSY ;  Surgeon: Midge Minium, MD;  Location: [facility];  Service: Endoscopy;  Laterality: N/A;  priority 3   PLEURAL SCARIFICATION  05/2015   POLYPECTOMY N/A 12/13/2019   Procedure: POLYPECTOMY;  Surgeon: Midge Minium, MD;  Location: [facility];  Service: Endoscopy;  Laterality: N/A;       Home Medications    Prior to Admission medications   Medication Sig Start Date End Date Taking? Authorizing Provider  atorvastatin (LIPITOR) 20 MG tablet TAKE 1 TABLET BY MOUTH EVERYDAY AT BEDTIME 10/22/21  Yes Bosie Clos, MD  ciprofloxacin (CIPRO) 500 MG tablet Take 1 tablet (500 mg total) by mouth 2 (two) times daily for 7 days. 07/25/23 08/01/23 Yes Becky Augusta, NP  phenazopyridine (PYRIDIUM) 200 MG tablet Take 1 tablet (200 mg total) by mouth 3 (three) times daily. 07/25/23  Yes Becky Augusta, NP  triamterene-hydrochlorothiazide (DYAZIDE) 37.5-25 MG capsule TAKE 1 CAPSULE BY MOUTH EVERY DAY 12/21/21  Yes Bosie Clos, MD  aspirin EC 81 MG tablet Take 81 mg by mouth at bedtime.    [provider]  lisinopril (ZESTRIL) 20 MG tablet TAKE 1 TABLET BY MOUTH EVERY DAY Patient not taking: Reported on 02/05/2022 08/07/21   Bosie Clos, MD  PRESCRIPTION MEDICATION betahistine    [provider]    Family History Family History  Problem Relation Age of Onset   Hypertension Mother  Hyperlipidemia Mother    Heart attack Father    Hypertension Father    CVA Father    ALS Brother    Prostate cancer Brother     Social History Social History   Tobacco Use   Smoking status: Never   Smokeless tobacco: Never  Vaping Use   Vaping status: Never Used  Substance Use Topics   Alcohol use: No    Alcohol/week: 0.0 standard drinks of alcohol   Drug use: No     Allergies   Patient has no known allergies.   Review of Systems Review of  Systems  Constitutional:  Negative for fever.  Gastrointestinal:  Positive for abdominal pain. Negative for nausea and vomiting.  Genitourinary:  Positive for dysuria, frequency, hematuria and urgency.  Musculoskeletal:  Negative for back pain.     Physical Exam Triage Vital Signs ED Triage Vitals  Encounter Vitals Group     BP      Systolic BP Percentile      Diastolic BP Percentile      Pulse      Resp      Temp      Temp src      SpO2      Weight      Height      Head Circumference      Peak Flow      Pain Score      Pain Loc      Pain Education      Exclude from Growth Chart    No data found.  Updated Vital Signs BP (!) 142/81 (BP Location: Right Arm)   Pulse 66   Temp 98.3 F (36.8 C) (Oral)   Resp 16   SpO2 96%   Visual Acuity Right Eye Distance:   Left Eye Distance:   Bilateral Distance:    Right Eye Near:   Left Eye Near:    Bilateral Near:     Physical Exam Vitals and nursing note reviewed.  Constitutional:      Appearance: Normal appearance. He is not ill-appearing.  HENT:     Head: Normocephalic and atraumatic.  Cardiovascular:     Rate and Rhythm: Normal rate and regular rhythm.     Pulses: Normal pulses.     Heart sounds: Normal heart sounds. No murmur heard.    No friction rub. No gallop.  Pulmonary:     Effort: Pulmonary effort is normal.     Breath sounds: Normal breath sounds. No wheezing, rhonchi or rales.  Abdominal:     General: Abdomen is flat.     Palpations: Abdomen is soft.     Tenderness: There is no abdominal tenderness. There is no right CVA tenderness, left CVA tenderness, guarding or rebound.  Skin:    General: Skin is warm and dry.     Capillary Refill: Capillary refill takes less than 2 seconds.     Findings: No rash.  Neurological:     General: No focal deficit present.     Mental Status: He is alert and oriented to person, place, and time.      UC Treatments / Results  Labs (all labs ordered are listed,  but only abnormal results are displayed) Labs Reviewed  URINALYSIS, W/ REFLEX TO CULTURE (INFECTION SUSPECTED) - Abnormal; Notable for the following components:      Result Value   Color, Urine BROWN (*)    APPearance HAZY (*)    Hgb urine dipstick LARGE (*)  Bilirubin Urine SMALL (*)    Protein, ur 100 (*)    Bacteria, UA FEW (*)    All other components within normal limits  URINE CULTURE    EKG   Radiology No results found.  Procedures Procedures (including critical care time)  Medications Ordered in UC Medications - No data to display  Initial Impression / Assessment and Plan / UC Course  I have reviewed the triage vital signs and the nursing notes.  Pertinent labs & imaging results that were available during my care of the patient were reviewed by me and considered in my medical decision making (see chart for details).   Patient is a pleasant, nontoxic-appearing 83 year old male presenting for evaluation of UTI symptoms outlined HPI above.  His physical exam is pretty benign.  Cardiopulmonary exam reveals clear lung sounds in all fields.  No CVA tenderness.  Abdomen is soft, flat, and nontender.  I will order urinalysis to evaluate for the presence of UTI.  His urine specimen that he provided was dark amber in color.  Urinalysis shows brown color with hazy appearance, large hemoglobin, small bilirubin, 100 protein, negative for leukocyte esterase or nitrites.  Reflex microscopy does show 6-10 WBCs, greater than 50 RBCs, and few bacteria.  I will send urine for culture.  I will discharge patient home with a diagnosis of hemorrhagic cystitis and start him on Cipro 500 mg twice daily for 7 days.  Also Pyridium every 8 hours as needed for urinary discomfort.   Final Clinical Impressions(s) / UC Diagnoses   Final diagnoses:  Hemorrhagic cystitis     Discharge Instructions      Take the Cipro twice daily for 7 days with food for treatment of urinary tract  infection.  Use the Pyridium every 8 hours as needed for urinary discomfort.  This will turn your urine a bright red-orange.  Increase your oral fluid intake so that you increase your urine production and or flushing your urinary system.  Take an over-the-counter probiotic, such as Culturelle-Align-Activia, 1 hour after each dose of antibiotic to prevent diarrhea or yeast infections from forming.  We will culture urine and change the antibiotics if necessary.  Return for reevaluation, or see your primary care provider, for any new or worsening symptoms.      ED Prescriptions     Medication Sig Dispense Auth. Provider   ciprofloxacin (CIPRO) 500 MG tablet Take 1 tablet (500 mg total) by mouth 2 (two) times daily for 7 days. 14 tablet Becky Augusta, NP   phenazopyridine (PYRIDIUM) 200 MG tablet Take 1 tablet (200 mg total) by mouth 3 (three) times daily. 6 tablet Becky Augusta, NP      PDMP not reviewed this encounter.   Becky Augusta, NP 07/25/23 1235

## 2023-07-25 NOTE — ED Triage Notes (Signed)
 Patient states that he started having lower abdominal pain this morning with blood in his urine.

## 2023-07-25 NOTE — Discharge Instructions (Addendum)
Take the Cipro twice daily for 7 days with food for treatment of urinary tract infection.  Use the Pyridium every 8 hours as needed for urinary discomfort.  This will turn your urine a bright red-orange.  Increase your oral fluid intake so that you increase your urine production and or flushing your urinary system.  Take an over-the-counter probiotic, such as Culturelle-Align-Activia, 1 hour after each dose of antibiotic to prevent diarrhea or yeast infections from forming.  We will culture urine and change the antibiotics if necessary.  Return for reevaluation, or see your primary care provider, for any new or worsening symptoms.

## 2023-07-26 LAB — URINE CULTURE
Culture: NO GROWTH
Special Requests: NORMAL

## 2023-07-28 ENCOUNTER — Other Ambulatory Visit: Payer: Self-pay | Admitting: Nephrology

## 2023-07-28 DIAGNOSIS — I10 Essential (primary) hypertension: Secondary | ICD-10-CM | POA: Diagnosis not present

## 2023-07-28 DIAGNOSIS — N1832 Chronic kidney disease, stage 3b: Secondary | ICD-10-CM | POA: Diagnosis not present

## 2023-07-28 DIAGNOSIS — N179 Acute kidney failure, unspecified: Secondary | ICD-10-CM | POA: Diagnosis not present

## 2023-08-05 ENCOUNTER — Emergency Department

## 2023-08-05 ENCOUNTER — Ambulatory Visit
Admission: RE | Admit: 2023-08-05 | Discharge: 2023-08-05 | Disposition: A | Discharge status: Home / Self Care | Payer: Medicare HMO | Source: Ambulatory Visit | Attending: Nephrology | Admitting: Nephrology

## 2023-08-05 ENCOUNTER — Other Ambulatory Visit: Payer: Self-pay

## 2023-08-05 ENCOUNTER — Inpatient Hospital Stay
Admission: EM | Admit: 2023-08-05 | Discharge: 2023-08-08 | DRG: 683 | Disposition: A | Discharge status: Home / Self Care | Attending: Osteopathic Medicine | Admitting: Osteopathic Medicine

## 2023-08-05 DIAGNOSIS — R310 Gross hematuria: Secondary | ICD-10-CM | POA: Diagnosis present

## 2023-08-05 DIAGNOSIS — K5730 Diverticulosis of large intestine without perforation or abscess without bleeding: Secondary | ICD-10-CM | POA: Diagnosis not present

## 2023-08-05 DIAGNOSIS — Z8042 Family history of malignant neoplasm of prostate: Secondary | ICD-10-CM

## 2023-08-05 DIAGNOSIS — R911 Solitary pulmonary nodule: Secondary | ICD-10-CM | POA: Diagnosis present

## 2023-08-05 DIAGNOSIS — Z7982 Long term (current) use of aspirin: Secondary | ICD-10-CM | POA: Diagnosis not present

## 2023-08-05 DIAGNOSIS — I10 Essential (primary) hypertension: Secondary | ICD-10-CM | POA: Insufficient documentation

## 2023-08-05 DIAGNOSIS — Z8249 Family history of ischemic heart disease and other diseases of the circulatory system: Secondary | ICD-10-CM | POA: Diagnosis not present

## 2023-08-05 DIAGNOSIS — R1084 Generalized abdominal pain: Secondary | ICD-10-CM | POA: Diagnosis not present

## 2023-08-05 DIAGNOSIS — N189 Chronic kidney disease, unspecified: Secondary | ICD-10-CM

## 2023-08-05 DIAGNOSIS — I129 Hypertensive chronic kidney disease with stage 1 through stage 4 chronic kidney disease, or unspecified chronic kidney disease: Secondary | ICD-10-CM | POA: Diagnosis not present

## 2023-08-05 DIAGNOSIS — N2889 Other specified disorders of kidney and ureter: Secondary | ICD-10-CM | POA: Diagnosis not present

## 2023-08-05 DIAGNOSIS — Z79899 Other long term (current) drug therapy: Secondary | ICD-10-CM

## 2023-08-05 DIAGNOSIS — E8770 Fluid overload, unspecified: Secondary | ICD-10-CM

## 2023-08-05 DIAGNOSIS — K659 Peritonitis, unspecified: Secondary | ICD-10-CM

## 2023-08-05 DIAGNOSIS — E871 Hypo-osmolality and hyponatremia: Secondary | ICD-10-CM | POA: Diagnosis not present

## 2023-08-05 DIAGNOSIS — E278 Other specified disorders of adrenal gland: Secondary | ICD-10-CM | POA: Diagnosis present

## 2023-08-05 DIAGNOSIS — I2510 Atherosclerotic heart disease of native coronary artery without angina pectoris: Secondary | ICD-10-CM | POA: Diagnosis present

## 2023-08-05 DIAGNOSIS — N320 Bladder-neck obstruction: Secondary | ICD-10-CM | POA: Diagnosis present

## 2023-08-05 DIAGNOSIS — N1832 Chronic kidney disease, stage 3b: Secondary | ICD-10-CM | POA: Insufficient documentation

## 2023-08-05 DIAGNOSIS — K449 Diaphragmatic hernia without obstruction or gangrene: Secondary | ICD-10-CM | POA: Diagnosis not present

## 2023-08-05 DIAGNOSIS — N1330 Unspecified hydronephrosis: Secondary | ICD-10-CM | POA: Diagnosis not present

## 2023-08-05 DIAGNOSIS — E8721 Acute metabolic acidosis: Secondary | ICD-10-CM | POA: Diagnosis present

## 2023-08-05 DIAGNOSIS — E875 Hyperkalemia: Secondary | ICD-10-CM | POA: Diagnosis not present

## 2023-08-05 DIAGNOSIS — N179 Acute kidney failure, unspecified: Principal | ICD-10-CM

## 2023-08-05 DIAGNOSIS — R338 Other retention of urine: Principal | ICD-10-CM

## 2023-08-05 DIAGNOSIS — E7800 Pure hypercholesterolemia, unspecified: Secondary | ICD-10-CM | POA: Diagnosis present

## 2023-08-05 DIAGNOSIS — N281 Cyst of kidney, acquired: Secondary | ICD-10-CM | POA: Diagnosis not present

## 2023-08-05 DIAGNOSIS — Z83438 Family history of other disorder of lipoprotein metabolism and other lipidemia: Secondary | ICD-10-CM | POA: Diagnosis not present

## 2023-08-05 DIAGNOSIS — D631 Anemia in chronic kidney disease: Secondary | ICD-10-CM | POA: Diagnosis not present

## 2023-08-05 DIAGNOSIS — Z823 Family history of stroke: Secondary | ICD-10-CM

## 2023-08-05 DIAGNOSIS — E8779 Other fluid overload: Secondary | ICD-10-CM

## 2023-08-05 DIAGNOSIS — R339 Retention of urine, unspecified: Secondary | ICD-10-CM | POA: Diagnosis not present

## 2023-08-05 LAB — CBC
HCT: 32.2 % — ABNORMAL LOW (ref 39.0–52.0)
HCT: 30.1 % — ABNORMAL LOW (ref 39.0–52.0)
Hemoglobin: 11.4 g/dL — ABNORMAL LOW (ref 13.0–17.0)
Hemoglobin: 10.5 g/dL — ABNORMAL LOW (ref 13.0–17.0)
MCH: 33.2 pg (ref 26.0–34.0)
MCH: 33.1 pg (ref 26.0–34.0)
MCHC: 35.4 g/dL (ref 30.0–36.0)
MCHC: 34.9 g/dL (ref 30.0–36.0)
MCV: 93.9 fL (ref 80.0–100.0)
MCV: 95 fL (ref 80.0–100.0)
Platelets: 540 10*3/uL — ABNORMAL HIGH (ref 150–400)
Platelets: 439 10*3/uL — ABNORMAL HIGH (ref 150–400)
RBC: 3.43 MIL/uL — ABNORMAL LOW (ref 4.22–5.81)
RBC: 3.17 MIL/uL — ABNORMAL LOW (ref 4.22–5.81)
RDW: 12.5 % (ref 11.5–15.5)
RDW: 12.4 % (ref 11.5–15.5)
WBC: 14.9 10*3/uL — ABNORMAL HIGH (ref 4.0–10.5)
WBC: 13.4 10*3/uL — ABNORMAL HIGH (ref 4.0–10.5)
nRBC: 0 % (ref 0.0–0.2)
nRBC: 0 % (ref 0.0–0.2)

## 2023-08-05 LAB — URINALYSIS, ROUTINE W REFLEX MICROSCOPIC
Bacteria, UA: NONE SEEN
Bilirubin Urine: NEGATIVE
Glucose, UA: 50 mg/dL — AB
Ketones, ur: NEGATIVE mg/dL
Leukocytes,Ua: NEGATIVE
Nitrite: NEGATIVE
Protein, ur: NEGATIVE mg/dL
Specific Gravity, Urine: 1.01 (ref 1.005–1.030)
pH: 5 (ref 5.0–8.0)

## 2023-08-05 LAB — BASIC METABOLIC PANEL
Anion gap: 20 — ABNORMAL HIGH (ref 5–15)
BUN: 149 mg/dL — ABNORMAL HIGH (ref 8–23)
CO2: 9 mmol/L — ABNORMAL LOW (ref 22–32)
Calcium: 7.7 mg/dL — ABNORMAL LOW (ref 8.9–10.3)
Chloride: 96 mmol/L — ABNORMAL LOW (ref 98–111)
Creatinine, Ser: 9.6 mg/dL — ABNORMAL HIGH (ref 0.61–1.24)
GFR, Estimated: 5 mL/min — ABNORMAL LOW (ref >60)
Glucose, Bld: 110 mg/dL — ABNORMAL HIGH (ref 70–99)
Potassium: 5.7 mmol/L — ABNORMAL HIGH (ref 3.5–5.1)
Sodium: 125 mmol/L — ABNORMAL LOW (ref 135–145)

## 2023-08-05 LAB — COMPREHENSIVE METABOLIC PANEL
ALT: 21 U/L (ref 0–44)
AST: 23 U/L (ref 15–41)
Albumin: 3.4 g/dL — ABNORMAL LOW (ref 3.5–5.0)
Alkaline Phosphatase: 85 U/L (ref 38–126)
Anion gap: 20 — ABNORMAL HIGH (ref 5–15)
BUN: 145 mg/dL — ABNORMAL HIGH (ref 8–23)
CO2: 10 mmol/L — ABNORMAL LOW (ref 22–32)
Calcium: 8.2 mg/dL — ABNORMAL LOW (ref 8.9–10.3)
Chloride: 93 mmol/L — ABNORMAL LOW (ref 98–111)
Creatinine, Ser: 10.05 mg/dL — ABNORMAL HIGH (ref 0.61–1.24)
GFR, Estimated: 5 mL/min — ABNORMAL LOW (ref >60)
Glucose, Bld: 116 mg/dL — ABNORMAL HIGH (ref 70–99)
Potassium: 5.4 mmol/L — ABNORMAL HIGH (ref 3.5–5.1)
Sodium: 123 mmol/L — ABNORMAL LOW (ref 135–145)
Total Bilirubin: 1 mg/dL (ref 0.0–1.2)
Total Protein: 7 g/dL (ref 6.5–8.1)

## 2023-08-05 LAB — LIPASE, BLOOD: Lipase: 130 U/L — ABNORMAL HIGH (ref 11–51)

## 2023-08-05 MED ORDER — SODIUM BICARBONATE 8.4 % IV SOLN
50.0000 meq | Freq: Once | INTRAVENOUS | Status: AC
  Administered 2023-08-05: 50 meq via INTRAVENOUS
  Filled 2023-08-05: qty 50

## 2023-08-05 MED ORDER — ALBUTEROL SULFATE HFA 108 (90 BASE) MCG/ACT IN AERS: 2.0000 | INHALATION_SPRAY | RESPIRATORY_TRACT | Status: DC

## 2023-08-05 MED ORDER — HEPARIN SODIUM (PORCINE) 5000 UNIT/ML IJ SOLN
5000.0000 [IU] | Freq: Three times a day (TID) | INTRAMUSCULAR | Status: DC
  Administered 2023-08-06 – 2023-08-08 (×8): 5000 [IU] via SUBCUTANEOUS
  Filled 2023-08-05 (×8): qty 1

## 2023-08-05 MED ORDER — SODIUM BICARBONATE 8.4 % IV SOLN
INTRAVENOUS | Status: DC
  Filled 2023-08-05: qty 150
  Filled 2023-08-05 (×2): qty 1000
  Filled 2023-08-05: qty 150
  Filled 2023-08-05: qty 1000

## 2023-08-05 MED ORDER — SODIUM CHLORIDE 0.9% FLUSH
3.0000 mL | Freq: Two times a day (BID) | INTRAVENOUS | Status: DC
  Administered 2023-08-06 – 2023-08-08 (×6): 3 mL via INTRAVENOUS

## 2023-08-05 MED ORDER — POLYETHYLENE GLYCOL 3350 17 G PO PACK: 17.0000 g | PACK | Freq: Every day | ORAL | Status: DC | PRN

## 2023-08-05 MED ORDER — SODIUM CHLORIDE 0.9 % IV BOLUS
1000.0000 mL | Freq: Once | INTRAVENOUS | Status: AC
  Administered 2023-08-05: 1000 mL via INTRAVENOUS

## 2023-08-05 MED ORDER — SODIUM CHLORIDE 0.9 % IV SOLN
3.0000 g | INTRAVENOUS | Status: DC
  Administered 2023-08-06: 3 g via INTRAVENOUS
  Filled 2023-08-05: qty 8

## 2023-08-05 MED ORDER — ACETAMINOPHEN 650 MG RE SUPP: 650.0000 mg | Freq: Four times a day (QID) | RECTAL | Status: DC | PRN

## 2023-08-05 MED ORDER — ACETAMINOPHEN 325 MG PO TABS: 650.0000 mg | ORAL_TABLET | Freq: Four times a day (QID) | ORAL | Status: DC | PRN

## 2023-08-05 MED ORDER — SODIUM ZIRCONIUM CYCLOSILICATE 10 G PO PACK
10.0000 g | PACK | ORAL | Status: AC
Start: 2023-08-06 — End: 2023-08-07
  Administered 2023-08-06: 10 g via ORAL
  Filled 2023-08-05: qty 1

## 2023-08-05 MED ORDER — ALBUTEROL SULFATE (2.5 MG/3ML) 0.083% IN NEBU
2.5000 mg | INHALATION_SOLUTION | RESPIRATORY_TRACT | Status: AC
  Administered 2023-08-06: 2.5 mg via RESPIRATORY_TRACT
  Filled 2023-08-05: qty 3

## 2023-08-05 NOTE — Consult Note (Signed)
 Pharmacy Antibiotic Note  Zachary [name] is a 83 y.o. male admitted on 08/05/2023 with intraabdominal infection.  Pharmacy has been consulted for Unasyn dosing.  AKI with Scr 10.05 (Scr 2.8 07/11/23)  Plan: Start Unasyn 3 grams IV every 24 hours Follow renal function for further adjustments  Height: 6\' 2"  (188 cm) Weight: 76.2 kg (168 lb) IBW/kg (Calculated) : 82.2  Temp (24hrs), Avg:97.6 F (36.4 C), Min:97.5 F (36.4 C), Max:97.7 F (36.5 C)  Recent Labs  Lab 08/05/23 1859  WBC 14.9*  CREATININE 10.05*    Estimated Creatinine Clearance: 6.3 mL/min (A) (by C-G formula based on SCr of 10.05 mg/dL (H)).    No Known Allergies  Antimicrobials this admission: Unasyn 3/4 >>   Dose adjustments this admission: N/A  Microbiology results: 3/4 BCx: pending 3/4 UCx: pending    Thank you for allowing pharmacy to be a part of this patient's care.  Barrie Folk, PharmD 08/05/2023 10:22 PM

## 2023-08-05 NOTE — ED Notes (Signed)
 Foley clamped after 1500 mL output then unclamped after 15 minutes and continued to drain. . Total of 3500 mL output noted since insertion. Hospitalist aware.

## 2023-08-05 NOTE — ED Notes (Signed)
 Bladder scan shows volume >999 mL. Vicente Males, MD notified

## 2023-08-05 NOTE — ED Notes (Signed)
 Hospitalist at bedside

## 2023-08-05 NOTE — ED Triage Notes (Signed)
 Pt to ED via EMS from home, pt states aprox 1 week ago he was seen at [facility] for hematuria, pt states that has cleared up but he has had lower abd tightness, pt denies dysuria or hematuria at this time. Pt reports some difficulty initiating a stream of urine. Reports some nausea as well.

## 2023-08-05 NOTE — ED Notes (Signed)
 ED Provider at bedside.

## 2023-08-05 NOTE — Assessment & Plan Note (Addendum)
 Obstructive renal failure with moderate chronic asymptaomtic hyponatremia, hyperkalemia mild (no peaked T wave) and marked high anion and normal anion gap acidoss metabolic.  S/p foley. Case d.w. renal by ER doc and on bicarb 50 meq iv and infusion. At this time, I think we should just monitor above abnormalities. As long as they head in the right direction, I think we should be good.  Pending US renal.

## 2023-08-05 NOTE — Assessment & Plan Note (Signed)
 To some extent this is chronic, patient wear pressure stockings. I believe made worse by AKI as above. Anticipate resolution will occur with foley placement. Renal has ordered iv fluids to treat acidosis. Will monitor for any hypoxia and stop fluids accoridngly if necessary. Daily I/o and weights. At this time, patient appears to be net negative even with the fluids given thus far.

## 2023-08-05 NOTE — Assessment & Plan Note (Signed)
 No clincal focus of infection at this time.

## 2023-08-05 NOTE — Assessment & Plan Note (Addendum)
 CT findign of "Asymmetric small to moderate volume left perinephric/retroperitoneal free fluid, which may be reactive or secondary to rupture of renal collecting system."  I suspect urinary contamination of peritoneum. A.w. leukocytosis. Will rx with unasyn.   Clear liquid diet.

## 2023-08-05 NOTE — ED Notes (Signed)
 U/s at bedside

## 2023-08-05 NOTE — ED Triage Notes (Signed)
 First nurse note: Patient arrived by [facility] from home for lower abdominal pain a few days. C/o tightness in abdomen. A&O x4 per EMS  Seen at [facility] recently and told he has cyst per EMS.  EMS vitals: 153/80 b/p 100% RA 79HR

## 2023-08-05 NOTE — Assessment & Plan Note (Signed)
 Now also noted to have right middle lobe nodule. Outpatient followup.

## 2023-08-05 NOTE — ED Provider Notes (Signed)
 [facility] Provider Note   Event Date/Time   First MD Initiated Contact with Patient 08/05/23 2039     (approximate) History  No chief complaint on file.  HPI Zachary [name] is a 83 y.o. male with stated past medical history of hyperlipidemia, hypertension, and coronary artery disease who presents from his nephrologist office with concerns for urinary retention and difficult urination.  Patient states that he has recently had difficulty initiating stream of urine.  Patient now complains of tightness in the lower abdomen that he has had for the last few days in addition to this difficulty with urination.  Patient also endorses hematuria and dysuria at the beginning of initiating stream ROS: Patient currently denies any vision changes, tinnitus, difficulty speaking, facial droop, sore throat, chest pain, shortness of breath, nausea/vomiting/diarrhea, or weakness/numbness/paresthesias in any extremity   Physical Exam  Triage Vital Signs: ED Triage Vitals  Encounter Vitals Group     BP 08/05/23 1857 (!) 144/87     Systolic BP Percentile --      Diastolic BP Percentile --      Pulse Rate 08/05/23 1857 86     Resp 08/05/23 1857 17     Temp 08/05/23 1857 97.7 F (36.5 C)     Temp Source 08/05/23 1857 Oral     SpO2 08/05/23 1857 100 %     Weight 08/05/23 1858 168 lb (76.2 kg)     Height 08/05/23 1858 6\' 2"  (1.88 m)     Head Circumference --      Peak Flow --      Pain Score 08/05/23 1905 4     Pain Loc --      Pain Education --      Exclude from Growth Chart --    Most recent vital signs: Vitals:   08/05/23 1857 08/05/23 2128  BP: (!) 144/87 139/80  Pulse: 86 79  Resp: 17 16  Temp: 97.7 F (36.5 C) (!) 97.5 F (36.4 C)  SpO2: 100% 100%   General: Awake, oriented x4. CV:  Good peripheral perfusion.  Resp:  Normal effort.  Abd:  No distention.  Tensive mass appreciated in suprapubic area with mild tenderness to palpation Other:  Elderly  well-developed, well-nourished Caucasian male resting comfortably in no acute distress ED Results / Procedures / Treatments  Labs (all labs ordered are listed, but only abnormal results are displayed) Labs Reviewed  LIPASE, BLOOD - Abnormal; Notable for the following components:      Result Value   Lipase 130 (*)    All other components within normal limits  COMPREHENSIVE METABOLIC PANEL - Abnormal; Notable for the following components:   Sodium 123 (*)    Potassium 5.4 (*)    Chloride 93 (*)    CO2 10 (*)    Glucose, Bld 116 (*)    BUN 145 (*)    Creatinine, Ser 10.05 (*)    Calcium 8.2 (*)    Albumin 3.4 (*)    GFR, Estimated 5 (*)    Anion gap 20 (*)    All other components within normal limits  CBC - Abnormal; Notable for the following components:   WBC 14.9 (*)    RBC 3.43 (*)    Hemoglobin 11.4 (*)    HCT 32.2 (*)    Platelets 540 (*)    All other components within normal limits  URINALYSIS, ROUTINE W REFLEX MICROSCOPIC - Abnormal; Notable for the following components:   Color, Urine  YELLOW (*)    APPearance CLEAR (*)    Glucose, UA 50 (*)    Hgb urine dipstick SMALL (*)    All other components within normal limits   RADIOLOGY ED MD interpretation: CT of the abdomen and pelvis shows markedly distended urinary bladder with increased severe bilateral hydroureteronephrosis to the level of the markedly distended urinary bladder -Agree with radiology assessment Official radiology report(s): CT ABDOMEN PELVIS WO CONTRAST Result Date: 08/05/2023 CLINICAL DATA:  Acute on chronic kidney disease and hematuria EXAM: CT ABDOMEN AND PELVIS WITHOUT CONTRAST TECHNIQUE: Multidetector CT imaging of the abdomen and pelvis was performed following the standard protocol without IV contrast. RADIATION DOSE REDUCTION: This exam was performed according to the departmental dose-optimization program which includes automated exposure control, adjustment of the mA and/or kV according to patient  size and/or use of iterative reconstruction technique. COMPARISON:  CT chest dated 03/04/2022, CT abdomen and pelvis dated 02/22/2022 FINDINGS: Lower chest: New 9 x 8 mm right middle lobe nodule (3:4). Small left pleural effusion with relaxation atelectasis of the left lower lobe. Partially imaged heart size is normal. Coronary artery calcifications. Hepatobiliary: No focal hepatic lesions. No intra or extrahepatic biliary ductal dilation. Normal gallbladder. Pancreas: No focal lesions or main ductal dilation. Spleen: Normal in size without focal abnormality. Adrenals/Urinary Tract: 10 mm left adrenal nodule (2:24) contains macroscopic fat, -53 HU, in keeping with myelolipoma. Additional 12 mm nodule measures 36 HU, not substantially changed in size from 02/22/2022, likely adenoma. No specific follow-up imaging recommended. No right adrenal nodule. No suspicious renal masses by noncontrast technique. Bilateral diffuse renal cortical thinning with severe hydroureteronephrosis to the level of the markedly distended urinary bladder. Asymmetric small to moderate volume left perinephric/retroperitoneal free fluid. Markedly distended urinary bladder with a large diverticulum arising from the posterior left. Stomach/Bowel: Large hiatal hernia contains nearly the entire stomach. Normal appearance of the stomach. No evidence of bowel wall thickening, distention, or inflammatory changes. Colonic diverticulosis without acute diverticulitis. Normal appendix. Vascular/Lymphatic: Aortic atherosclerosis. IVC is markedly effaced by the markedly distended urinary bladder and right ureter immediately above the iliac bifurcation. No enlarged abdominal or pelvic lymph nodes. Reproductive: Mildly enlarged prostate gland containing dystrophic calcifications. Other: Small volume perihepatic free fluid in addition to left retroperitoneal free fluid as above. No free air fluid collection. Musculoskeletal: No acute or abnormal lytic or  blastic osseous lesions. IMPRESSION: 1. Markedly distended urinary bladder with increased severe bilateral hydroureteronephrosis to the level of the markedly distended urinary bladder in keeping with bladder outlet obstruction. Asymmetric small to moderate volume left perinephric/retroperitoneal free fluid, which may be reactive or secondary to rupture of renal collecting system. 2. New 9 x 8 mm right middle lobe nodule. Consider one of the following in 3 months for both low-risk and high-risk individuals: (a) repeat chest CT, (b) follow-up PET-CT, or (c) tissue sampling. This recommendation follows the consensus statement: Guidelines for Management of Incidental Pulmonary Nodules Detected on CT Images: From the Fleischner Society 2017; Radiology 2017; 284:228-243. 3. Small left pleural effusion with relaxation atelectasis of the left lower lobe. 4. Large hiatal hernia contains nearly the entire stomach. 5. Aortic Atherosclerosis (ICD10-I70.0). Coronary artery calcifications. Assessment for potential risk factor modification, dietary therapy or pharmacologic therapy may be warranted, if clinically indicated. Critical Value/emergent results were called by telephone at the time of interpretation on 08/05/2023 at 1:48 pm to provider MUNSOOR LATEEF , who verbally acknowledged these results. The patient will be escorted to the emergency department. Electronically Signed  By: Agustin Cree M.D.   On: 08/05/2023 13:53   PROCEDURES: Critical Care performed: No Procedures MEDICATIONS ORDERED IN ED: Medications  sodium bicarbonate 150 mEq in dextrose 5 % 1,150 mL infusion (has no administration in time range)  sodium bicarbonate injection 50 mEq (50 mEq Intravenous Given 08/05/23 2148)  sodium chloride 0.9 % bolus 1,000 mL (1,000 mLs Intravenous New Bag/Given 08/05/23 2147)   IMPRESSION / MDM / ASSESSMENT AND PLAN / ED COURSE  I reviewed the triage vital signs and the nursing notes.                             The  patient is on the cardiac monitor to evaluate for evidence of arrhythmia and/or significant heart rate changes. Patient's presentation is most consistent with acute presentation with potential threat to life or bodily function.  This patient presents to the ED for concern of acute urinary retention and acute renal failure, this involves an extensive number of treatment options, and is a complaint that carries with it a high risk of complications and morbidity.  The differential diagnosis includes acute tubular necrosis, medication side effect, acute renal failure, acute urinary retention Co morbidities that complicate the patient evaluation  Hypertension, hyperlipidemia, CAD Additional history obtained:  External records from outside source obtained and reviewed including recent office visit today Lab Tests:  I Ordered, and personally interpreted labs.  The pertinent results include: WBC 14.9, sodium 123, potassium 5.3, chloride 93, CO2 10, creatinine 10.05, calcium 8.2, GFR 5, and anion gap 20, lipase 130 Imaging Studies ordered:  I ordered imaging studies including renal ultrasound, CT abdomen pelvis from earlier today  I independently visualized and interpreted imaging which showed CT abdomen pelvis from earlier today showing acute urinary retention  I agree with the radiologist interpretation Cardiac Monitoring: / EKG:  The patient was maintained on a cardiac monitor.  I personally viewed and interpreted the cardiac monitored which showed an underlying rhythm of: Normal sinus rhythm Consultations Obtained:  I requested consultation with the nephrology,  and discussed lab and imaging findings as well as pertinent plan - they recommend: 1 amp of sodium bicarb as well as 75 mL/h bicarb drip Problem List / ED Course / Critical interventions / Medication management  Acute urinary retention, acute renal failure  I ordered medication including bicarbonate for low CO2  Reevaluation of the  patient after these medicines showed that the patient improved  I have reviewed the patients home medicines and have made adjustments as needed Dispo: Admit to medicine     FINAL CLINICAL IMPRESSION(S) / ED DIAGNOSES   Final diagnoses:  Acute urinary retention  Acute renal failure, unspecified acute renal failure type (HCC)   Rx / DC Orders   ED Discharge Orders     None      Note:  This document was prepared using Dragon voice recognition software and may include unintentional dictation errors.   Merwyn Katos, MD 08/05/23 2152

## 2023-08-05 NOTE — H&P (Signed)
 History and Physical    Patient: Zachary [name] ZOX:[id number] DOB: [birth year] DOA: 08/05/2023 DOS: the patient was seen and examined on 08/05/2023 PCP: Bosie Clos, MD  Patient coming from: Home>renal office> [facility] ER  Chief Complaint: No chief complaint on file.  HPI: Zachary [name] is a 83 y.o. male with medical history significant of medical conditions as lsited below. Patient and ER signout and pricnipal sources of history.  Patient seems to have ben in usual state of healht till about 2-3 weeks ago when patient noted worsening straining to urinate a.w. lower abd discomfort. Patient described having intermitent hematuria about 7 days ago. But resolved in the last few days. Patient has also had fatigue. Patient has chronic leg swelling - no change reported. No constipation, no vomting, no fever.  Patinet seems to have been at Renal office today, where there was a concern for urinary retention (based on CT finding of urinary bladder distention?) and patinet was advised to come to ER.  Here bladder scan revealed > 999 ml urine. Foley was placed. Thus far 2300 ml urine has been evacuated.   Patinet is feeling much better in terms of lower abd discomfort and relaxed. At this time offers no complaints.   S Cr 10, co2 10  Review of Systems: As mentioned in the history of present illness. All other systems reviewed and are negative. Past Medical History:  Diagnosis Date   B12 deficiency anemia 08/03/2019   Hypercholesterolemia    Hypertension    Vertigo    last episode approx 09/2018   Past Surgical History:  Procedure Laterality Date   APPENDECTOMY     CATARACT EXTRACTION Bilateral    COLONOSCOPY WITH PROPOFOL N/A 12/24/2018   Procedure: COLONOSCOPY WITH PROPOFOL;  Surgeon: Midge Minium, MD;  Location: [facility] END[facility];  Service: Endoscopy;  Laterality: N/A;   COLONOSCOPY WITH PROPOFOL N/A 12/13/2019   Procedure: COLONOSCOPY WITH BIOPSY ;  Surgeon: Midge Minium, MD;   Location: [facility];  Service: Endoscopy;  Laterality: N/A;  priority 3   PLEURAL SCARIFICATION  05/2015   POLYPECTOMY N/A 12/13/2019   Procedure: POLYPECTOMY;  Surgeon: Midge Minium, MD;  Location: [facility];  Service: Endoscopy;  Laterality: N/A;   Social History:  reports that he has never smoked. He has never used smokeless tobacco. He reports that he does not drink alcohol and does not use drugs.  No Known Allergies  Family History  Problem Relation Age of Onset   Hypertension Mother    Hyperlipidemia Mother    Heart attack Father    Hypertension Father    CVA Father    ALS Brother    Prostate cancer Brother     Prior to Admission medications   Medication Sig Start Date End Date Taking? Authorizing Provider  aspirin EC 81 MG tablet Take 81 mg by mouth at bedtime.    [provider]  atorvastatin (LIPITOR) 20 MG tablet TAKE 1 TABLET BY MOUTH EVERYDAY AT BEDTIME 10/22/21   Bosie Clos, MD  lisinopril (ZESTRIL) 20 MG tablet TAKE 1 TABLET BY MOUTH EVERY DAY Patient not taking: Reported on 02/05/2022 08/07/21   Bosie Clos, MD  phenazopyridine (PYRIDIUM) 200 MG tablet Take 1 tablet (200 mg total) by mouth 3 (three) times daily. 07/25/23   Becky Augusta, NP  PRESCRIPTION MEDICATION betahistine    [provider]  triamterene-hydrochlorothiazide (DYAZIDE) 37.5-25 MG capsule TAKE 1 CAPSULE BY MOUTH EVERY DAY 12/21/21   Bosie Clos, MD  Physical Exam: Vitals:   08/05/23 1857 08/05/23 1858 08/05/23 2128  BP: (!) 144/87  139/80  Pulse: 86  79  Resp: 17  16  Temp: 97.7 F (36.5 C)  (!) 97.5 F (36.4 C)  TempSrc: Oral  Oral  SpO2: 100%  100%  Weight:  76.2 kg   Height:  6\' 2"  (1.88 m)    General - thin gentelman with evidence of bitemproal wasting and loss of buccal fat etc. Otherwise no distresss. And gives coherent account of symptoms. Resp - b/l a/e vesicular Cvs-s1s2 normal Abdomen - soft non tender GU - foley draining  tan colered urine. Extremity - no focal weakness. Bialtearl ankle/leg 1-2 +edema. Data Reviewed:  Labs on Admission:  Results for orders placed or performed during the hospital encounter of 08/05/23 (from the past 24 hours)  Lipase, blood     Status: Abnormal   Collection Time: 08/05/23  6:59 PM  Result Value Ref Range   Lipase 130 (H) 11 - 51 U/L  Comprehensive metabolic panel     Status: Abnormal   Collection Time: 08/05/23  6:59 PM  Result Value Ref Range   Sodium 123 (L) 135 - 145 mmol/L   Potassium 5.4 (H) 3.5 - 5.1 mmol/L   Chloride 93 (L) 98 - 111 mmol/L   CO2 10 (L) 22 - 32 mmol/L   Glucose, Bld 116 (H) 70 - 99 mg/dL   BUN 161 (H) 8 - 23 mg/dL   Creatinine, Ser 09.60 (H) 0.61 - 1.24 mg/dL   Calcium 8.2 (L) 8.9 - 10.3 mg/dL   Total Protein 7.0 6.5 - 8.1 g/dL   Albumin 3.4 (L) 3.5 - 5.0 g/dL   AST 23 15 - 41 U/L   ALT 21 0 - 44 U/L   Alkaline Phosphatase 85 38 - 126 U/L   Total Bilirubin 1.0 0.0 - 1.2 mg/dL   GFR, Estimated 5 (L) >60 mL/min   Anion gap 20 (H) 5 - 15  CBC     Status: Abnormal   Collection Time: 08/05/23  6:59 PM  Result Value Ref Range   WBC 14.9 (H) 4.0 - 10.5 K/uL   RBC 3.43 (L) 4.22 - 5.81 MIL/uL   Hemoglobin 11.4 (L) 13.0 - 17.0 g/dL   HCT 45.4 (L) 09.8 - 11.9 %   MCV 93.9 80.0 - 100.0 fL   MCH 33.2 26.0 - 34.0 pg   MCHC 35.4 30.0 - 36.0 g/dL   RDW 14.7 82.9 - 56.2 %   Platelets 540 (H) 150 - 400 K/uL   nRBC 0.0 0.0 - 0.2 %  Urinalysis, Routine w reflex microscopic -Urine, Clean Catch     Status: Abnormal   Collection Time: 08/05/23  9:26 PM  Result Value Ref Range   Color, Urine YELLOW (A) YELLOW   APPearance CLEAR (A) CLEAR   Specific Gravity, Urine 1.010 1.005 - 1.030   pH 5.0 5.0 - 8.0   Glucose, UA 50 (A) NEGATIVE mg/dL   Hgb urine dipstick SMALL (A) NEGATIVE   Bilirubin Urine NEGATIVE NEGATIVE   Ketones, ur NEGATIVE NEGATIVE mg/dL   Protein, ur NEGATIVE NEGATIVE mg/dL   Nitrite NEGATIVE NEGATIVE   Leukocytes,Ua NEGATIVE NEGATIVE    RBC / HPF 0-5 0 - 5 RBC/hpf   WBC, UA 0-5 0 - 5 WBC/hpf   Bacteria, UA NONE SEEN NONE SEEN   Squamous Epithelial / HPF 0-5 0 - 5 /HPF   Mucus PRESENT    Basic Metabolic Panel: Recent Labs  Lab 08/05/23 1859  NA 123*  K 5.4*  CL 93*  CO2 10*  GLUCOSE 116*  BUN 145*  CREATININE 10.05*  CALCIUM 8.2*   Liver Function Tests: Recent Labs  Lab 08/05/23 1859  AST 23  ALT 21  ALKPHOS 85  BILITOT 1.0  PROT 7.0  ALBUMIN 3.4*   Recent Labs  Lab 08/05/23 1859  LIPASE 130*   No results for input(s): "AMMONIA" in the last 168 hours. CBC: Recent Labs  Lab 08/05/23 1859  WBC 14.9*  HGB 11.4*  HCT 32.2*  MCV 93.9  PLT 540*   Cardiac Enzymes: No results for input(s): "CKTOTAL", "CKMB", "CKMBINDEX", "TROPONINIHS" in the last 168 hours.  BNP (last 3 results) No results for input(s): "PROBNP" in the last 8760 hours. CBG: No results for input(s): "GLUCAP" in the last 168 hours.  Radiological Exams on Admission:  CT ABDOMEN PELVIS WO CONTRAST Result Date: 08/05/2023 CLINICAL DATA:  Acute on chronic kidney disease and hematuria EXAM: CT ABDOMEN AND PELVIS WITHOUT CONTRAST TECHNIQUE: Multidetector CT imaging of the abdomen and pelvis was performed following the standard protocol without IV contrast. RADIATION DOSE REDUCTION: This exam was performed according to the departmental dose-optimization program which includes automated exposure control, adjustment of the mA and/or kV according to patient size and/or use of iterative reconstruction technique. COMPARISON:  CT chest dated 03/04/2022, CT abdomen and pelvis dated 02/22/2022 FINDINGS: Lower chest: New 9 x 8 mm right middle lobe nodule (3:4). Small left pleural effusion with relaxation atelectasis of the left lower lobe. Partially imaged heart size is normal. Coronary artery calcifications. Hepatobiliary: No focal hepatic lesions. No intra or extrahepatic biliary ductal dilation. Normal gallbladder. Pancreas: No focal lesions or  main ductal dilation. Spleen: Normal in size without focal abnormality. Adrenals/Urinary Tract: 10 mm left adrenal nodule (2:24) contains macroscopic fat, -53 HU, in keeping with myelolipoma. Additional 12 mm nodule measures 36 HU, not substantially changed in size from 02/22/2022, likely adenoma. No specific follow-up imaging recommended. No right adrenal nodule. No suspicious renal masses by noncontrast technique. Bilateral diffuse renal cortical thinning with severe hydroureteronephrosis to the level of the markedly distended urinary bladder. Asymmetric small to moderate volume left perinephric/retroperitoneal free fluid. Markedly distended urinary bladder with a large diverticulum arising from the posterior left. Stomach/Bowel: Large hiatal hernia contains nearly the entire stomach. Normal appearance of the stomach. No evidence of bowel wall thickening, distention, or inflammatory changes. Colonic diverticulosis without acute diverticulitis. Normal appendix. Vascular/Lymphatic: Aortic atherosclerosis. IVC is markedly effaced by the markedly distended urinary bladder and right ureter immediately above the iliac bifurcation. No enlarged abdominal or pelvic lymph nodes. Reproductive: Mildly enlarged prostate gland containing dystrophic calcifications. Other: Small volume perihepatic free fluid in addition to left retroperitoneal free fluid as above. No free air fluid collection. Musculoskeletal: No acute or abnormal lytic or blastic osseous lesions. IMPRESSION: 1. Markedly distended urinary bladder with increased severe bilateral hydroureteronephrosis to the level of the markedly distended urinary bladder in keeping with bladder outlet obstruction. Asymmetric small to moderate volume left perinephric/retroperitoneal free fluid, which may be reactive or secondary to rupture of renal collecting system. 2. New 9 x 8 mm right middle lobe nodule. Consider one of the following in 3 months for both low-risk and high-risk  individuals: (a) repeat chest CT, (b) follow-up PET-CT, or (c) tissue sampling. This recommendation follows the consensus statement: Guidelines for Management of Incidental Pulmonary Nodules Detected on CT Images: From the Fleischner Society 2017; Radiology 2017; 284:228-243. 3. Small left pleural  effusion with relaxation atelectasis of the left lower lobe. 4. Large hiatal hernia contains nearly the entire stomach. 5. Aortic Atherosclerosis (ICD10-I70.0). Coronary artery calcifications. Assessment for potential risk factor modification, dietary therapy or pharmacologic therapy may be warranted, if clinically indicated. Critical Value/emergent results were called by telephone at the time of interpretation on 08/05/2023 at 1:48 pm to provider MUNSOOR LATEEF , who verbally acknowledged these results. The patient will be escorted to the emergency department. Electronically Signed   By: Agustin Cree M.D.   On: 08/05/2023 13:53      No intake/output data recorded. Total I/O In: -  Out: 2000 [Urine:2000]    Assessment and Plan: Peritonitis (HCC) CT findign of "Asymmetric small to moderate volume left perinephric/retroperitoneal free fluid, which may be reactive or secondary to rupture of renal collecting system."  I suspect urinary contamination of peritoneum. A.w. leukocytosis. Will rx with unasyn.   AKI (acute kidney injury) (HCC) Obstructive renal failure with moderate chronic asymptaomtic hyponatremia, hyperkalemia mild (no peaked T wave) and marked high anion and normal anion gap acidoss metabolic.  S/p foley. Case d.w. renal by ER doc and on bicarb 50 meq iv and infusion. At this time, I think we should just monitor above abnormalities. As long as they head in the right direction, I think we should be good.  Pending US renal.   Nodule of left lung Now also noted to have right middle lobe nodule. Outpatient followup.   Med rec pending pharmcy input   Advance Care Planning:   Code Status:  Prior patient wishes to be full code.  Consults: renal consultation.  Family Communication: family was updated by ER provider. Currently left ER.  Severity of Illness: The appropriate patient status for this patient is INPATIENT. Inpatient status is judged to be reasonable and necessary in order to provide the required intensity of service to ensure the patient's safety. The patient's presenting symptoms, physical exam findings, and initial radiographic and laboratory data in the context of their chronic comorbidities is felt to place them at high risk for further clinical deterioration. Furthermore, it is not anticipated that the patient will be medically stable for discharge from the hospital within 2 midnights of admission.   * I certify that at the point of admission it is my clinical judgment that the patient will require inpatient hospital care spanning beyond 2 midnights from the point of admission due to high intensity of service, high risk for further deterioration and high frequency of surveillance required.*  Author: Nolberto Hanlon, MD 08/05/2023 9:56 PM  For on call review [email or web address].

## 2023-08-06 DIAGNOSIS — N1330 Unspecified hydronephrosis: Secondary | ICD-10-CM

## 2023-08-06 DIAGNOSIS — N2889 Other specified disorders of kidney and ureter: Secondary | ICD-10-CM | POA: Diagnosis not present

## 2023-08-06 DIAGNOSIS — R339 Retention of urine, unspecified: Secondary | ICD-10-CM

## 2023-08-06 DIAGNOSIS — N179 Acute kidney failure, unspecified: Secondary | ICD-10-CM | POA: Diagnosis not present

## 2023-08-06 LAB — APTT: aPTT: 43 s — ABNORMAL HIGH (ref 24–36)

## 2023-08-06 LAB — CBC
HCT: 26.7 % — ABNORMAL LOW (ref 39.0–52.0)
Hemoglobin: 9.9 g/dL — ABNORMAL LOW (ref 13.0–17.0)
MCH: 33.6 pg (ref 26.0–34.0)
MCHC: 37.1 g/dL — ABNORMAL HIGH (ref 30.0–36.0)
MCV: 90.5 fL (ref 80.0–100.0)
Platelets: 428 10*3/uL — ABNORMAL HIGH (ref 150–400)
RBC: 2.95 MIL/uL — ABNORMAL LOW (ref 4.22–5.81)
RDW: 12.2 % (ref 11.5–15.5)
WBC: 11 10*3/uL — ABNORMAL HIGH (ref 4.0–10.5)
nRBC: 0 % (ref 0.0–0.2)

## 2023-08-06 LAB — BASIC METABOLIC PANEL
Anion gap: 18 — ABNORMAL HIGH (ref 5–15)
BUN: 120 mg/dL — ABNORMAL HIGH (ref 8–23)
CO2: 12 mmol/L — ABNORMAL LOW (ref 22–32)
Calcium: 7.4 mg/dL — ABNORMAL LOW (ref 8.9–10.3)
Chloride: 98 mmol/L (ref 98–111)
Creatinine, Ser: 8.48 mg/dL — ABNORMAL HIGH (ref 0.61–1.24)
GFR, Estimated: 6 mL/min — ABNORMAL LOW (ref >60)
Glucose, Bld: 130 mg/dL — ABNORMAL HIGH (ref 70–99)
Potassium: 4 mmol/L (ref 3.5–5.1)
Sodium: 128 mmol/L — ABNORMAL LOW (ref 135–145)

## 2023-08-06 LAB — PROTIME-INR
INR: 1.2 (ref 0.8–1.2)
Prothrombin Time: 15.2 s (ref 11.4–15.2)

## 2023-08-06 MED ORDER — CHLORHEXIDINE GLUCONATE CLOTH 2 % EX PADS
6.0000 | MEDICATED_PAD | Freq: Every day | CUTANEOUS | Status: DC
  Administered 2023-08-06 – 2023-08-08 (×3): 6 via TOPICAL

## 2023-08-06 NOTE — Hospital Course (Addendum)
 Hospital course / significant events:   HPI: Zachary [name] is a 83 y.o. male with stated past medical history of hyperlipidemia, hypertension, and coronary artery disease who presents from his nephrologist office with concerns for urinary retention and difficult urination. He recently had difficulty initiating stream of urine. Patient now complains of tightness in the lower abdomen that he has had for the last few days in addition to this difficulty with urination. Patient also endorses hematuria and dysuria at the beginning of initiating stream   03/04: CT shows very distended bladder, pt is admitted to hospitalist service. Foley placed, 2300 mL urine out at time of admission 03/05: urology recs for repeat CT in 2 days, 08/08/2023.  Nephrology following..      Consultants:  Nephrology Urology  Procedures/Surgeries: None      ASSESSMENT & PLAN:   Urinary retention with severe bladder distention, hydronephrosis, evidence of left calyceal rupture Less likely for Peritonitis Gross hematuria likely secondary to bladder distention Clinically improving following Foley catheter placement, continue Foley Eventually will get hematuria workup Anticipate calyceal rupture will resolve with decompression Repeat CT in 2 days  Bladder outlet obstruction Family history of prostate cancer in brother At some point will need PSA but will defer for now   AKI (acute kidney injury) / acute renal failure secondary to bladder outlet obstruction  Obstructive renal failure with moderate chronic asymptaomtic hyponatremia, hyperkalemia mild (no peaked T wave) and marked high anion and normal anion gap acidoss metabolic. S/p bicarb 50 meq iv and infusion treat underlying cause  Nephrology following Monitor BMP   Nodule of left lung Now also noted to have right middle lobe nodule.  Outpatient followup.      No concerns based on BMI: Body mass index is 21.57 kg/m.  Underweight - under 18   overweight - 25 to 29 obese - 30 or more Class 1 obesity: BMI of 30.0 to 34 Class 2 obesity: BMI of 35.0 to 39 Class 3 obesity: BMI of 40.0 to 49 Super Morbid Obesity: BMI 50-59 Super-super Morbid Obesity: BMI 60+ Significantly low or high BMI is associated with higher medical risk.  Weight management advised as adjunct to other disease management and risk reduction treatments    DVT prophylaxis: Heparin given AKI IV fluids: Bicarb infusion per nephrology  Nutrition: Regular diet Central lines / invasive devices: Foley catheter  Code Status: Full code ACP documentation reviewed:  none on file in [facility]  TOC needs: TBD pending PT/OT evaluation Barriers to dispo / significant pending items: AKI/ARF, planning to repeat CT on 08/08/2023, nephrology/urology clearance prior to discharge

## 2023-08-06 NOTE — Consult Note (Signed)
 Urology Consult  I have been asked to see the patient by Dr. Lyn Hollingshead, for evaluation and management of BOO, hydronephrosis, ?urine in peritoneal space.  Chief Complaint: Lower abdominal pain + abdominal distention.  History of Present Illness: Zachary [name] is a 83 y.o. year old male who presented to the ED yesterday with reports of several days of lower abdominal pain and abdominal distention in the setting of gross hematuria and recent UTI diagnosis at urgent care.  Bladder scan on arrival showed >938mL.  CTAP without contrast on admission showed a markedly distended urinary bladder with severe bilateral hydroureteronephrosis to the level of the bladder as well as left perinephric/retroperitoneal free fluid consistent with calyceal rupture.  Prostate was slightly enlarged; no osseous lesions noted, but he does have a new right middle lobe nodule.  A Foley catheter was placed with >2 L urinary output.  Admission labs notable for acute renal failure with creatinine 10.05 (baseline 1.4); white count 14.9; and UA with no nitrites, pyuria, hematuria, or bacteriuria.  He was started on antibiotics with concern for peritonitis as below.  A.m. labs with downtrending creatinine, 8.48, and downtrending white count, 11.0.  Today he reports no history of voiding difficulty until recently.  He has never taken medication for enlarged prostate.  He is feeling much better since Foley catheter placement, but admits to some fatigue.  Foley catheter in place draining pink-tinged urine.  He was seen in urgent care on 07/25/2023, at which point he reported suprapubic discomfort, gross hematuria, dysuria, urgency, and frequency.  His UA was notable for no nitrites, 6-10 WBC/hpf, 50 RBC/hpf, and few bacteria urine culture finalized with no growth.  Anti-infectives (From admission, onward)    Start     Dose/Rate Route Frequency Ordered Stop   08/05/23 2230  Ampicillin-Sulbactam (UNASYN) 3 g in sodium  chloride 0.9 % 100 mL IVPB        3 g 200 mL/hr over 30 Minutes Intravenous Every 24 hours 08/05/23 2222          Past Medical History:  Diagnosis Date   B12 deficiency anemia 08/03/2019   Hypercholesterolemia    Hypertension    Vertigo    last episode approx 09/2018    Past Surgical History:  Procedure Laterality Date   APPENDECTOMY     CATARACT EXTRACTION Bilateral    COLONOSCOPY WITH PROPOFOL N/A 12/24/2018   Procedure: COLONOSCOPY WITH PROPOFOL;  Surgeon: Midge Minium, MD;  Location: [facility] ENDOSCOPY;  Service: Endoscopy;  Laterality: N/A;   COLONOSCOPY WITH PROPOFOL N/A 12/13/2019   Procedure: COLONOSCOPY WITH BIOPSY ;  Surgeon: Midge Minium, MD;  Location: [facility];  Service: Endoscopy;  Laterality: N/A;  priority 3   PLEURAL SCARIFICATION  05/2015   POLYPECTOMY N/A 12/13/2019   Procedure: POLYPECTOMY;  Surgeon: Midge Minium, MD;  Location: [facility];  Service: Endoscopy;  Laterality: N/A;    Home Medications:  No outpatient medications have been marked as taking for the 08/05/23 encounter [facility] Encounter).    Allergies: No Known Allergies  Family History  Problem Relation Age of Onset   Hypertension Mother    Hyperlipidemia Mother    Heart attack Father    Hypertension Father    CVA Father    ALS Brother    Prostate cancer Brother     Social History:  reports that he has never smoked. He has never used smokeless tobacco. He reports that he does not drink alcohol and does not use drugs.  ROS:  A complete review of systems was performed.  All systems are negative except for pertinent findings as noted.  Physical Exam:  Vital signs in last 24 hours: Temp:  [97.5 F (36.4 C)-98.2 F (36.8 C)] 98.2 F (36.8 C) (03/05 0732) Pulse Rate:  [78-87] 78 (03/05 0732) Resp:  [16-20] 20 (03/05 0732) BP: (95-144)/(64-87) 97/69 (03/05 0732) SpO2:  [93 %-100 %] 93 % (03/05 0732) Weight:  [76.2 kg] 76.2 kg (03/04 1858) Constitutional:  Alert and  oriented, no acute distress HEENT: [place] AT, moist mucus membranes Cardiovascular: No clubbing, cyanosis, or edema Respiratory: Normal respiratory effort Skin: No rashes, bruises or suspicious lesions Lymph: No cervical or inguinal adenopathy Neurologic: Grossly intact, no focal deficits, moving all 4 extremities Psychiatric: Normal mood and affect  Laboratory Data:  Recent Labs    08/05/23 1859 08/05/23 2239 08/06/23 0404  WBC 14.9* 13.4* 11.0*  HGB 11.4* 10.5* 9.9*  HCT 32.2* 30.1* 26.7*   Recent Labs    08/05/23 1859 08/05/23 2210 08/06/23 0404  NA 123* 125* 128*  K 5.4* 5.7* 4.0  CL 93* 96* 98  CO2 10* 9* 12*  GLUCOSE 116* 110* 130*  BUN 145* 149* 120*  CREATININE 10.05* 9.60* 8.48*  CALCIUM 8.2* 7.7* 7.4*   Recent Labs    08/06/23 0404  INR 1.2   Urinalysis    Component Value Date/Time   COLORURINE YELLOW (A) 08/05/2023 2126   APPEARANCEUR CLEAR (A) 08/05/2023 2126   LABSPEC 1.010 08/05/2023 2126   PHURINE 5.0 08/05/2023 2126   GLUCOSEU 50 (A) 08/05/2023 2126   HGBUR SMALL (A) 08/05/2023 2126   BILIRUBINUR NEGATIVE 08/05/2023 2126   BILIRUBINUR negative 06/30/2019 0823   KETONESUR NEGATIVE 08/05/2023 2126   PROTEINUR NEGATIVE 08/05/2023 2126   UROBILINOGEN 1.0 06/30/2019 0823   NITRITE NEGATIVE 08/05/2023 2126   LEUKOCYTESUR NEGATIVE 08/05/2023 2126   Results for orders placed or performed during the hospital encounter of 08/05/23  Culture, blood (Routine X 2) w Reflex to ID Panel     Status: None (Preliminary result)   Collection Time: 08/05/23 10:39 PM   Specimen: BLOOD  Result Value Ref Range Status   Specimen Description BLOOD RIGHT FOREARM  Final   Special Requests   Final    BOTTLES DRAWN AEROBIC AND ANAEROBIC Blood Culture results may not be optimal due to an inadequate volume of blood received in culture bottles   Culture   Final    NO GROWTH < 12 HOURS Performed at [facility] [address] [place], [place] [address]     Report Status PENDING  Incomplete  Culture, blood (Routine X 2) w Reflex to ID Panel     Status: None (Preliminary result)   Collection Time: 08/05/23 11:55 PM   Specimen: BLOOD  Result Value Ref Range Status   Specimen Description BLOOD RIGHT ARM  Final   Special Requests   Final    BOTTLES DRAWN AEROBIC AND ANAEROBIC Blood Culture results may not be optimal due to an inadequate volume of blood received in culture bottles   Culture   Final    NO GROWTH < 12 HOURS Performed at [facility], [address], [place], [place] [address]    Report Status PENDING  Incomplete    Radiologic Imaging: US Renal Result Date: 08/06/2023 CLINICAL DATA:  Acute renal failure. EXAM: RENAL / URINARY TRACT ULTRASOUND COMPLETE COMPARISON:  None Available. FINDINGS: Right Kidney: Renal measurements: 10.7 cm x 5.6 cm x 4.3 cm = volume: 135.9 mL.  There is diffuse renal cortical thinning. Diffusely increased echogenicity of the renal parenchyma is noted. A 2.5 cm x 2.6 cm x 2.3 cm cyst is seen within the upper pole of the right kidney. There is moderate severity hydronephrosis. Left Kidney: Renal measurements: 10.3 cm x 4.6 cm x 4.3 cm = volume: 106.4 mL. There is diffuse renal cortical thinning. Diffusely increased echogenicity of the renal parenchyma is noted. A 0.9 cm x 1.0 cm x 0.7 cm cyst is seen within the left kidney. There is moderate severity hydronephrosis. Bladder: A Foley catheter is in place. Other: None. IMPRESSION: 1. Bilateral echogenic kidneys which may represent sequelae associated with medical renal disease. 2. Bilateral renal cysts. 3. Moderate severity bilateral hydronephrosis. Electronically Signed   By: Aram Candela M.D.   On: 08/06/2023 00:30   CT ABDOMEN PELVIS WO CONTRAST Result Date: 08/05/2023 CLINICAL DATA:  Acute on chronic kidney disease and hematuria EXAM: CT ABDOMEN AND PELVIS WITHOUT CONTRAST TECHNIQUE: Multidetector CT imaging of the abdomen and pelvis was performed  following the standard protocol without IV contrast. RADIATION DOSE REDUCTION: This exam was performed according to the departmental dose-optimization program which includes automated exposure control, adjustment of the mA and/or kV according to patient size and/or use of iterative reconstruction technique. COMPARISON:  CT chest dated 03/04/2022, CT abdomen and pelvis dated 02/22/2022 FINDINGS: Lower chest: New 9 x 8 mm right middle lobe nodule (3:4). Small left pleural effusion with relaxation atelectasis of the left lower lobe. Partially imaged heart size is normal. Coronary artery calcifications. Hepatobiliary: No focal hepatic lesions. No intra or extrahepatic biliary ductal dilation. Normal gallbladder. Pancreas: No focal lesions or main ductal dilation. Spleen: Normal in size without focal abnormality. Adrenals/Urinary Tract: 10 mm left adrenal nodule (2:24) contains macroscopic fat, -53 HU, in keeping with myelolipoma. Additional 12 mm nodule measures 36 HU, not substantially changed in size from 02/22/2022, likely adenoma. No specific follow-up imaging recommended. No right adrenal nodule. No suspicious renal masses by noncontrast technique. Bilateral diffuse renal cortical thinning with severe hydroureteronephrosis to the level of the markedly distended urinary bladder. Asymmetric small to moderate volume left perinephric/retroperitoneal free fluid. Markedly distended urinary bladder with a large diverticulum arising from the posterior left. Stomach/Bowel: Large hiatal hernia contains nearly the entire stomach. Normal appearance of the stomach. No evidence of bowel wall thickening, distention, or inflammatory changes. Colonic diverticulosis without acute diverticulitis. Normal appendix. Vascular/Lymphatic: Aortic atherosclerosis. IVC is markedly effaced by the markedly distended urinary bladder and right ureter immediately above the iliac bifurcation. No enlarged abdominal or pelvic lymph nodes.  Reproductive: Mildly enlarged prostate gland containing dystrophic calcifications. Other: Small volume perihepatic free fluid in addition to left retroperitoneal free fluid as above. No free air fluid collection. Musculoskeletal: No acute or abnormal lytic or blastic osseous lesions. IMPRESSION: 1. Markedly distended urinary bladder with increased severe bilateral hydroureteronephrosis to the level of the markedly distended urinary bladder in keeping with bladder outlet obstruction. Asymmetric small to moderate volume left perinephric/retroperitoneal free fluid, which may be reactive or secondary to rupture of renal collecting system. 2. New 9 x 8 mm right middle lobe nodule. Consider one of the following in 3 months for both low-risk and high-risk individuals: (a) repeat chest CT, (b) follow-up PET-CT, or (c) tissue sampling. This recommendation follows the consensus statement: Guidelines for Management of Incidental Pulmonary Nodules Detected on CT Images: From the Fleischner Society 2017; Radiology 2017; 284:228-243. 3. Small left pleural effusion with relaxation atelectasis of the left lower lobe. 4.  Large hiatal hernia contains nearly the entire stomach. 5. Aortic Atherosclerosis (ICD10-I70.0). Coronary artery calcifications. Assessment for potential risk factor modification, dietary therapy or pharmacologic therapy may be warranted, if clinically indicated. Critical Value/emergent results were called by telephone at the time of interpretation on 08/05/2023 at 1:48 pm to provider MUNSOOR LATEEF , who verbally acknowledged these results. The patient will be escorted to the emergency department. Electronically Signed   By: Agustin Cree M.D.   On: 08/05/2023 13:53   Assessment & Plan:  83 year old male with no past urologic history admitted with acute renal failure secondary to massive urinary retention with bilateral hydroureteronephrosis and evidence of left calyceal rupture.  He is clinically improving today  following Foley catheter placement.  Anticipate recent gross hematuria was secondary to significant bladder distention, though he would benefit from a hematuria workup down the line.  We discussed that I anticipate his calyceal rupture will resolve with urinary decompression.  I will plan for repeat CT in 2 days to assess for resolution of hydronephrosis and make sure he is not developing any intraperitoneal fluid collections.  We discussed that he will require a one-time PSA down the line, as his new urinary retention is a bit concerning for advanced prostate cancer, especially in light of his new pulmonary nodule, however I would like to defer this today as it will be falsely elevated in the setting of massive retention and catheterization.  Notably, he does have a family history of prostate cancer in his brother.  Given degree of retention, would recommend proceeding directly to bladder outlet workup outpatient with consideration of outlet procedures.  Recommendations: -Supportive care and antibiotics per primary team -Continue Foley catheter -Trend renal function -Repeat CTAP in 2 days -Outpatient follow-up for cystoscopy, will eventually need one-time PSA  Thank you for involving me in this patient's care, I will continue to follow along.  Carman Ching, PA-C 08/06/2023 12:50 PM

## 2023-08-06 NOTE — Progress Notes (Signed)
 PROGRESS NOTE    Zachary [name]   QMV:[id number] DOB: 02/23/1941  DOA: 08/05/2023 Date of Service: 08/06/23 which is hospital day 1  PCP: Bosie Clos, MD    Hospital course / significant events:   HPI: Zachary [name] is a 83 y.o. male with stated past medical history of hyperlipidemia, hypertension, and coronary artery disease who presents from his nephrologist office with concerns for urinary retention and difficult urination. Zachary [name] recently had difficulty initiating stream of urine. Patient now complains of tightness in the lower abdomen that Zachary [name] has had for the last few days in addition to this difficulty with urination. Patient also endorses hematuria and dysuria at the beginning of initiating stream   03/04: CT shows very distended bladder, pt is admitted to hospitalist service. Foley placed, 2300 mL urine out at time of admission 03/05: urology recs for repeat CT in 2 days, 08/08/2023.  Nephrology following..      Consultants:  Nephrology Urology  Procedures/Surgeries: None      ASSESSMENT & PLAN:   Urinary retention with severe bladder distention, hydronephrosis, evidence of left calyceal rupture Less likely for Peritonitis Gross hematuria likely secondary to bladder distention Clinically improving following Foley catheter placement, continue Foley Eventually will get hematuria workup Anticipate calyceal rupture will resolve with decompression Repeat CT in 2 days  Bladder outlet obstruction Family history of prostate cancer in brother At some point will need PSA but will defer for now   AKI (acute kidney injury) / acute renal failure secondary to bladder outlet obstruction  Obstructive renal failure with moderate chronic asymptaomtic hyponatremia, hyperkalemia mild (no peaked T wave) and marked high anion and normal anion gap acidoss metabolic. S/p bicarb 50 meq iv and infusion treat underlying cause  Nephrology following Monitor BMP   Nodule of  left lung Now also noted to have right middle lobe nodule.  Outpatient followup.      No concerns based on BMI: Body mass index is 21.57 kg/m.  Underweight - under 18  overweight - 25 to 29 obese - 30 or more Class 1 obesity: BMI of 30.0 to 34 Class 2 obesity: BMI of 35.0 to 39 Class 3 obesity: BMI of 40.0 to 49 Super Morbid Obesity: BMI 50-59 Super-super Morbid Obesity: BMI 60+ Significantly low or high BMI is associated with higher medical risk.  Weight management advised as adjunct to other disease management and risk reduction treatments    DVT prophylaxis: Heparin given AKI IV fluids: Bicarb infusion per nephrology  Nutrition: Regular diet Central lines / invasive devices: Foley catheter  Code Status: Full code ACP documentation reviewed:  none on file in [facility]  TOC needs: TBD pending PT/OT evaluation Barriers to dispo / significant pending items: AKI/ARF, planning to repeat CT on 08/08/2023, nephrology/urology clearance prior to discharge             Subjective / Brief ROS:  Patient reports feeling much better today, abdominal pain has essentially resolved, no nausea/vomiting, Zachary [name] reports some fatigue Denies CP/SOB.  Pain controlled.  Denies new weakness.  Tolerating diet.  Reports no concerns w/ urination/defecation.   Family Communication: none at bedside     Objective Findings:  Vitals:   08/05/23 2333 08/06/23 0256 08/06/23 0721 08/06/23 0732  BP: 137/81 95/64  97/69  Pulse: 87 83  78  Resp: 17 17  20   Temp: 98 F (36.7 C) 97.6 F (36.4 C)  98.2 F (36.8 C)  TempSrc:    Oral  SpO2: 100% 100%  100% 93%  Weight:      Height:        Intake/Output Summary (Last 24 hours) at 08/06/2023 1444 Last data filed at 08/06/2023 1301 Gross per 24 hour  Intake 1489.11 ml  Output 40981 ml  Net -9460.89 ml   Filed Weights   08/05/23 1858  Weight: 76.2 kg    Examination:  Physical Exam Constitutional:      General: Zachary [name] is not in acute  distress. Cardiovascular:     Rate and Rhythm: Normal rate and regular rhythm.  Pulmonary:     Effort: Pulmonary effort is normal.     Breath sounds: Normal breath sounds.  Abdominal:     General: Bowel sounds are normal.     Palpations: Abdomen is soft.  Musculoskeletal:     Right lower leg: No edema.     Left lower leg: No edema.  Skin:    General: Skin is warm and dry.  Neurological:     General: No focal deficit present.     Mental Status: Zachary [name] is alert and oriented to person, place, and time.          Scheduled Medications:   Chlorhexidine Gluconate Cloth  6 each Topical Daily   heparin  5,000 Units Subcutaneous Q8H   sodium chloride flush  3 mL Intravenous Q12H    Continuous Infusions:  sodium bicarbonate 150 mEq in dextrose 5 % 1,150 mL infusion 75 mL/hr at 08/06/23 0315    PRN Medications:  acetaminophen **OR** acetaminophen, polyethylene glycol  Antimicrobials from admission:  Anti-infectives (From admission, onward)    Start     Dose/Rate Route Frequency Ordered Stop   08/05/23 2230  Ampicillin-Sulbactam (UNASYN) 3 g in sodium chloride 0.9 % 100 mL IVPB  Status:  Discontinued        3 g 200 mL/hr over 30 Minutes Intravenous Every 24 hours 08/05/23 2222 08/06/23 1442           Data Reviewed:  I have personally reviewed the following...  CBC: Recent Labs  Lab 08/05/23 1859 08/05/23 2239 08/06/23 0404  WBC 14.9* 13.4* 11.0*  HGB 11.4* 10.5* 9.9*  HCT 32.2* 30.1* 26.7*  MCV 93.9 95.0 90.5  PLT 540* 439* 428*   Basic Metabolic Panel: Recent Labs  Lab 08/05/23 1859 08/05/23 2210 08/06/23 0404  NA 123* 125* 128*  K 5.4* 5.7* 4.0  CL 93* 96* 98  CO2 10* 9* 12*  GLUCOSE 116* 110* 130*  BUN 145* 149* 120*  CREATININE 10.05* 9.60* 8.48*  CALCIUM 8.2* 7.7* 7.4*   GFR: Estimated Creatinine Clearance: 7.5 mL/min (A) (by C-G formula based on SCr of 8.48 mg/dL (H)). Liver Function Tests: Recent Labs  Lab 08/05/23 1859  AST 23  ALT 21   ALKPHOS 85  BILITOT 1.0  PROT 7.0  ALBUMIN 3.4*   Recent Labs  Lab 08/05/23 1859  LIPASE 130*   No results for input(s): "AMMONIA" in the last 168 hours. Coagulation Profile: Recent Labs  Lab 08/06/23 0404  INR 1.2   Cardiac Enzymes: No results for input(s): "CKTOTAL", "CKMB", "CKMBINDEX", "TROPONINI" in the last 168 hours. BNP (last 3 results) No results for input(s): "PROBNP" in the last 8760 hours. HbA1C: No results for input(s): "HGBA1C" in the last 72 hours. CBG: No results for input(s): "GLUCAP" in the last 168 hours. Lipid Profile: No results for input(s): "CHOL", "HDL", "LDLCALC", "TRIG", "CHOLHDL", "LDLDIRECT" in the last 72 hours. Thyroid Function Tests: No results for input(s): "TSH", "T4TOTAL", "FREET4", "T3FREE", "  THYROIDAB" in the last 72 hours. Anemia Panel: No results for input(s): "VITAMINB12", "FOLATE", "FERRITIN", "TIBC", "IRON", "RETICCTPCT" in the last 72 hours. Most Recent Urinalysis On File:     Component Value Date/Time   COLORURINE YELLOW (A) 08/05/2023 2126   APPEARANCEUR CLEAR (A) 08/05/2023 2126   LABSPEC 1.010 08/05/2023 2126   PHURINE 5.0 08/05/2023 2126   GLUCOSEU 50 (A) 08/05/2023 2126   HGBUR SMALL (A) 08/05/2023 2126   BILIRUBINUR NEGATIVE 08/05/2023 2126   BILIRUBINUR negative 06/30/2019 0823   KETONESUR NEGATIVE 08/05/2023 2126   PROTEINUR NEGATIVE 08/05/2023 2126   UROBILINOGEN 1.0 06/30/2019 0823   NITRITE NEGATIVE 08/05/2023 2126   LEUKOCYTESUR NEGATIVE 08/05/2023 2126   Sepsis Labs: @LABRCNTIP (procalcitonin:4,lacticidven:4) Microbiology: Recent Results (from the past 240 hours)  Culture, blood (Routine X 2) w Reflex to ID Panel     Status: None (Preliminary result)   Collection Time: 08/05/23 10:39 PM   Specimen: BLOOD  Result Value Ref Range Status   Specimen Description BLOOD RIGHT FOREARM  Final   Special Requests   Final    BOTTLES DRAWN AEROBIC AND ANAEROBIC Blood Culture results may not be optimal due to an  inadequate volume of blood received in culture bottles   Culture   Final    NO GROWTH < 12 HOURS Performed at [facility], [address], [place], [place] [address]    Report Status PENDING  Incomplete  Culture, blood (Routine X 2) w Reflex to ID Panel     Status: None (Preliminary result)   Collection Time: 08/05/23 11:55 PM   Specimen: BLOOD  Result Value Ref Range Status   Specimen Description BLOOD RIGHT ARM  Final   Special Requests   Final    BOTTLES DRAWN AEROBIC AND ANAEROBIC Blood Culture results may not be optimal due to an inadequate volume of blood received in culture bottles   Culture   Final    NO GROWTH < 12 HOURS Performed at [facility], [address] [place], [place] [address]    Report Status PENDING  Incomplete      Radiology Studies last 3 days: US Renal Result Date: 08/06/2023 CLINICAL DATA:  Acute renal failure. EXAM: RENAL / URINARY TRACT ULTRASOUND COMPLETE COMPARISON:  None Available. FINDINGS: Right Kidney: Renal measurements: 10.7 cm x 5.6 cm x 4.3 cm = volume: 135.9 mL. There is diffuse renal cortical thinning. Diffusely increased echogenicity of the renal parenchyma is noted. A 2.5 cm x 2.6 cm x 2.3 cm cyst is seen within the upper pole of the right kidney. There is moderate severity hydronephrosis. Left Kidney: Renal measurements: 10.3 cm x 4.6 cm x 4.3 cm = volume: 106.4 mL. There is diffuse renal cortical thinning. Diffusely increased echogenicity of the renal parenchyma is noted. A 0.9 cm x 1.0 cm x 0.7 cm cyst is seen within the left kidney. There is moderate severity hydronephrosis. Bladder: A Foley catheter is in place. Other: None. IMPRESSION: 1. Bilateral echogenic kidneys which may represent sequelae associated with medical renal disease. 2. Bilateral renal cysts. 3. Moderate severity bilateral hydronephrosis. Electronically Signed   By: Aram Candela M.D.   On: 08/06/2023 00:30   CT ABDOMEN PELVIS WO CONTRAST Result Date:  08/05/2023 CLINICAL DATA:  Acute on chronic kidney disease and hematuria EXAM: CT ABDOMEN AND PELVIS WITHOUT CONTRAST TECHNIQUE: Multidetector CT imaging of the abdomen and pelvis was performed following the standard protocol without IV contrast. RADIATION DOSE REDUCTION: This exam was performed according to the departmental dose-optimization  program which includes automated exposure control, adjustment of the mA and/or kV according to patient size and/or use of iterative reconstruction technique. COMPARISON:  CT chest dated 03/04/2022, CT abdomen and pelvis dated 02/22/2022 FINDINGS: Lower chest: New 9 x 8 mm right middle lobe nodule (3:4). Small left pleural effusion with relaxation atelectasis of the left lower lobe. Partially imaged heart size is normal. Coronary artery calcifications. Hepatobiliary: No focal hepatic lesions. No intra or extrahepatic biliary ductal dilation. Normal gallbladder. Pancreas: No focal lesions or main ductal dilation. Spleen: Normal in size without focal abnormality. Adrenals/Urinary Tract: 10 mm left adrenal nodule (2:24) contains macroscopic fat, -53 HU, in keeping with myelolipoma. Additional 12 mm nodule measures 36 HU, not substantially changed in size from 02/22/2022, likely adenoma. No specific follow-up imaging recommended. No right adrenal nodule. No suspicious renal masses by noncontrast technique. Bilateral diffuse renal cortical thinning with severe hydroureteronephrosis to the level of the markedly distended urinary bladder. Asymmetric small to moderate volume left perinephric/retroperitoneal free fluid. Markedly distended urinary bladder with a large diverticulum arising from the posterior left. Stomach/Bowel: Large hiatal hernia contains nearly the entire stomach. Normal appearance of the stomach. No evidence of bowel wall thickening, distention, or inflammatory changes. Colonic diverticulosis without acute diverticulitis. Normal appendix. Vascular/Lymphatic: Aortic  atherosclerosis. IVC is markedly effaced by the markedly distended urinary bladder and right ureter immediately above the iliac bifurcation. No enlarged abdominal or pelvic lymph nodes. Reproductive: Mildly enlarged prostate gland containing dystrophic calcifications. Other: Small volume perihepatic free fluid in addition to left retroperitoneal free fluid as above. No free air fluid collection. Musculoskeletal: No acute or abnormal lytic or blastic osseous lesions. IMPRESSION: 1. Markedly distended urinary bladder with increased severe bilateral hydroureteronephrosis to the level of the markedly distended urinary bladder in keeping with bladder outlet obstruction. Asymmetric small to moderate volume left perinephric/retroperitoneal free fluid, which may be reactive or secondary to rupture of renal collecting system. 2. New 9 x 8 mm right middle lobe nodule. Consider one of the following in 3 months for both low-risk and high-risk individuals: (a) repeat chest CT, (b) follow-up PET-CT, or (c) tissue sampling. This recommendation follows the consensus statement: Guidelines for Management of Incidental Pulmonary Nodules Detected on CT Images: From the Fleischner Society 2017; Radiology 2017; 284:228-243. 3. Small left pleural effusion with relaxation atelectasis of the left lower lobe. 4. Large hiatal hernia contains nearly the entire stomach. 5. Aortic Atherosclerosis (ICD10-I70.0). Coronary artery calcifications. Assessment for potential risk factor modification, dietary therapy or pharmacologic therapy may be warranted, if clinically indicated. Critical Value/emergent results were called by telephone at the time of interpretation on 08/05/2023 at 1:48 pm to provider MUNSOOR LATEEF , who verbally acknowledged these results. The patient will be escorted to the emergency department. Electronically Signed   By: Agustin Cree M.D.   On: 08/05/2023 13:53       Time spent: 50 min     Sunnie Nielsen, DO [facility] 08/06/2023, 2:44 PM    Dictation software may have been used to generate the above note. Typos may occur and escape review in typed/dictated notes. Please contact Dr Lyn Hollingshead directly for clarity if needed.  Staff may message me via secure chat in Epic  but this may not receive an immediate response,  please page me for urgent matters!  If 7PM-7AM, please contact night coverage [email or web address]

## 2023-08-06 NOTE — Progress Notes (Addendum)
 [facility]  ROUNDING NOTE   Subjective:   Zachary [name] is a 83 year old male with past medical conditions including hypertension, Meniere's disease, B12 deficiency, hyponatremia and chronic kidney disease stage IIIb.  Patient presents to the emergency department at the advice of his nephrologist for evaluation of severe hydronephrosis.  Patient currently admitted for AKI (acute kidney injury) (HCC) [N17.9] Acute urinary retention [R33.8] Acute renal failure, unspecified acute renal failure type [facility]) [N17.9]  Patient is known to our practice and was seen in office on 07/28/2023 by Dr. Cherylann Ratel.  It was found during chart review that patient was previously found to have a bilateral hydronephrosis on a CT scan in 2023.  It appeared this had not been followed up by urology as patient was not currently followed by urology.  Patient did report discrete episodes of hematuria during office visit.  Patient states he began recently having hematuria more often.  Foley catheter placed at this time, hematuria noted.  Denies any current pain at this time.  It appears on ED arrival, patient complained of abdominal pain.  Labs on ED arrival concerning for sodium 123, potassium 5.4, serum bicarb 10, BUN 145, creatinine 10.05 with GFR 5, white count 14.9 with hemoglobin 11.4.  UA appears clear at this time.  Blood cultures pending.  CT abdomen pelvis shows a 10 mm left adrenal nodule, a severe hydroureteronephrosis with markedly distended urinary bladder with perinephric/retroperitoneal free fluid.  This finding confirmed on renal ultrasound.  We have been consulted to monitor acute kidney failure.  Objective:  Vital signs in last 24 hours:  Temp:  [97.5 F (36.4 C)-98.2 F (36.8 C)] 98.2 F (36.8 C) (03/05 0732) Pulse Rate:  [78-87] 78 (03/05 0732) Resp:  [16-20] 20 (03/05 0732) BP: (95-144)/(64-87) 97/69 (03/05 0732) SpO2:  [93 %-100 %] 93 % (03/05 0732) Weight:  [76.2 kg] 76.2 kg (03/04  1858)  Weight change:  Filed Weights   08/05/23 1858  Weight: 76.2 kg    Intake/Output: I/O last 3 completed shifts: In: 1479.1 [I.V.:379.1; Other:1000; IV Piggyback:100] Out: 8800 [Urine:8800]   Intake/Output this shift:  Total I/O In: 10 [I.V.:10] Out: 2150 [Urine:2150]  Physical Exam: General: NAD  Head: Normocephalic, atraumatic. Moist oral mucosal membranes  Eyes: Anicteric  Lungs:  Clear to auscultation, normal effort  Heart: Regular rate and rhythm  Abdomen:  Soft, nontender, nondistended  Extremities: No peripheral edema.  Neurologic: Alert and oriented, moving all four extremities  Skin: No lesions  Access: None  GU-Foley catheter-gross hematuria  Basic Metabolic Panel: Recent Labs  Lab 08/05/23 1859 08/05/23 2210 08/06/23 0404  NA 123* 125* 128*  K 5.4* 5.7* 4.0  CL 93* 96* 98  CO2 10* 9* 12*  GLUCOSE 116* 110* 130*  BUN 145* 149* 120*  CREATININE 10.05* 9.60* 8.48*  CALCIUM 8.2* 7.7* 7.4*    Liver Function Tests: Recent Labs  Lab 08/05/23 1859  AST 23  ALT 21  ALKPHOS 85  BILITOT 1.0  PROT 7.0  ALBUMIN 3.4*   Recent Labs  Lab 08/05/23 1859  LIPASE 130*   No results for input(s): "AMMONIA" in the last 168 hours.  CBC: Recent Labs  Lab 08/05/23 1859 08/05/23 2239 08/06/23 0404  WBC 14.9* 13.4* 11.0*  HGB 11.4* 10.5* 9.9*  HCT 32.2* 30.1* 26.7*  MCV 93.9 95.0 90.5  PLT 540* 439* 428*    Cardiac Enzymes: No results for input(s): "CKTOTAL", "CKMB", "CKMBINDEX", "TROPONINI" in the last 168 hours.  BNP: Invalid input(s): "POCBNP"  CBG: No results for input(s): "GLUCAP" in the last 168 hours.  Microbiology: Results for orders placed or performed during the hospital encounter of 08/05/23  Culture, blood (Routine X 2) w Reflex to ID Panel     Status: None (Preliminary result)   Collection Time: 08/05/23 10:39 PM   Specimen: BLOOD  Result Value Ref Range Status   Specimen Description BLOOD RIGHT FOREARM  Final   Special  Requests   Final    BOTTLES DRAWN AEROBIC AND ANAEROBIC Blood Culture results may not be optimal due to an inadequate volume of blood received in culture bottles   Culture   Final    NO GROWTH < 12 HOURS Performed at [facility], [address], [place], [place] [address]    Report Status PENDING  Incomplete  Culture, blood (Routine X 2) w Reflex to ID Panel     Status: None (Preliminary result)   Collection Time: 08/05/23 11:55 PM   Specimen: BLOOD  Result Value Ref Range Status   Specimen Description BLOOD RIGHT ARM  Final   Special Requests   Final    BOTTLES DRAWN AEROBIC AND ANAEROBIC Blood Culture results may not be optimal due to an inadequate volume of blood received in culture bottles   Culture   Final    NO GROWTH < 12 HOURS Performed at [facility], [address] [place], [place] [address]    Report Status PENDING  Incomplete    Coagulation Studies: Recent Labs    08/06/23 0404  LABPROT 15.2  INR 1.2    Urinalysis: Recent Labs    08/05/23 2126  COLORURINE YELLOW*  LABSPEC 1.010  PHURINE 5.0  GLUCOSEU 50*  HGBUR SMALL*  BILIRUBINUR NEGATIVE  KETONESUR NEGATIVE  PROTEINUR NEGATIVE  NITRITE NEGATIVE  LEUKOCYTESUR NEGATIVE      Imaging: US Renal Result Date: 08/06/2023 CLINICAL DATA:  Acute renal failure. EXAM: RENAL / URINARY TRACT ULTRASOUND COMPLETE COMPARISON:  None Available. FINDINGS: Right Kidney: Renal measurements: 10.7 cm x 5.6 cm x 4.3 cm = volume: 135.9 mL. There is diffuse renal cortical thinning. Diffusely increased echogenicity of the renal parenchyma is noted. A 2.5 cm x 2.6 cm x 2.3 cm cyst is seen within the upper pole of the right kidney. There is moderate severity hydronephrosis. Left Kidney: Renal measurements: 10.3 cm x 4.6 cm x 4.3 cm = volume: 106.4 mL. There is diffuse renal cortical thinning. Diffusely increased echogenicity of the renal parenchyma is noted. A 0.9 cm x 1.0 cm x 0.7 cm cyst is seen within the left  kidney. There is moderate severity hydronephrosis. Bladder: A Foley catheter is in place. Other: None. IMPRESSION: 1. Bilateral echogenic kidneys which may represent sequelae associated with medical renal disease. 2. Bilateral renal cysts. 3. Moderate severity bilateral hydronephrosis. Electronically Signed   By: Aram Candela M.D.   On: 08/06/2023 00:30   CT ABDOMEN PELVIS WO CONTRAST Result Date: 08/05/2023 CLINICAL DATA:  Acute on chronic kidney disease and hematuria EXAM: CT ABDOMEN AND PELVIS WITHOUT CONTRAST TECHNIQUE: Multidetector CT imaging of the abdomen and pelvis was performed following the standard protocol without IV contrast. RADIATION DOSE REDUCTION: This exam was performed according to the departmental dose-optimization program which includes automated exposure control, adjustment of the mA and/or kV according to patient size and/or use of iterative reconstruction technique. COMPARISON:  CT chest dated 03/04/2022, CT abdomen and pelvis dated 02/22/2022 FINDINGS: Lower chest: New 9 x 8 mm right middle lobe nodule (3:4). Small left pleural  effusion with relaxation atelectasis of the left lower lobe. Partially imaged heart size is normal. Coronary artery calcifications. Hepatobiliary: No focal hepatic lesions. No intra or extrahepatic biliary ductal dilation. Normal gallbladder. Pancreas: No focal lesions or main ductal dilation. Spleen: Normal in size without focal abnormality. Adrenals/Urinary Tract: 10 mm left adrenal nodule (2:24) contains macroscopic fat, -53 HU, in keeping with myelolipoma. Additional 12 mm nodule measures 36 HU, not substantially changed in size from 02/22/2022, likely adenoma. No specific follow-up imaging recommended. No right adrenal nodule. No suspicious renal masses by noncontrast technique. Bilateral diffuse renal cortical thinning with severe hydroureteronephrosis to the level of the markedly distended urinary bladder. Asymmetric small to moderate volume left  perinephric/retroperitoneal free fluid. Markedly distended urinary bladder with a large diverticulum arising from the posterior left. Stomach/Bowel: Large hiatal hernia contains nearly the entire stomach. Normal appearance of the stomach. No evidence of bowel wall thickening, distention, or inflammatory changes. Colonic diverticulosis without acute diverticulitis. Normal appendix. Vascular/Lymphatic: Aortic atherosclerosis. IVC is markedly effaced by the markedly distended urinary bladder and right ureter immediately above the iliac bifurcation. No enlarged abdominal or pelvic lymph nodes. Reproductive: Mildly enlarged prostate gland containing dystrophic calcifications. Other: Small volume perihepatic free fluid in addition to left retroperitoneal free fluid as above. No free air fluid collection. Musculoskeletal: No acute or abnormal lytic or blastic osseous lesions. IMPRESSION: 1. Markedly distended urinary bladder with increased severe bilateral hydroureteronephrosis to the level of the markedly distended urinary bladder in keeping with bladder outlet obstruction. Asymmetric small to moderate volume left perinephric/retroperitoneal free fluid, which may be reactive or secondary to rupture of renal collecting system. 2. New 9 x 8 mm right middle lobe nodule. Consider one of the following in 3 months for both low-risk and high-risk individuals: (a) repeat chest CT, (b) follow-up PET-CT, or (c) tissue sampling. This recommendation follows the consensus statement: Guidelines for Management of Incidental Pulmonary Nodules Detected on CT Images: From the Fleischner Society 2017; Radiology 2017; 284:228-243. 3. Small left pleural effusion with relaxation atelectasis of the left lower lobe. 4. Large hiatal hernia contains nearly the entire stomach. 5. Aortic Atherosclerosis (ICD10-I70.0). Coronary artery calcifications. Assessment for potential risk factor modification, dietary therapy or pharmacologic therapy may be  warranted, if clinically indicated. Critical Value/emergent results were called by telephone at the time of interpretation on 08/05/2023 at 1:48 pm to provider MUNSOOR LATEEF , who verbally acknowledged these results. The patient will be escorted to the emergency department. Electronically Signed   By: Agustin Cree M.D.   On: 08/05/2023 13:53     Medications:    ampicillin-sulbactam (UNASYN) IV Stopped (08/06/23 0134)   sodium bicarbonate 150 mEq in dextrose 5 % 1,150 mL infusion 75 mL/hr at 08/06/23 0315    Chlorhexidine Gluconate Cloth  6 each Topical Daily   heparin  5,000 Units Subcutaneous Q8H   sodium chloride flush  3 mL Intravenous Q12H   acetaminophen **OR** acetaminophen, polyethylene glycol  Assessment/ Plan:  Mr. Zachary [name] is a 83 y.o.  male with past medical conditions including hypertension, Meniere's disease, B12 deficiency, hyponatremia and chronic kidney disease stage IIIb.  Patient presents to the emergency department at the advice of his nephrologist for evaluation of severe hydronephrosis.  Patient currently admitted for AKI (acute kidney injury) (HCC) [N17.9] Acute urinary retention [R33.8] Acute renal failure, unspecified acute renal failure type (HCC) [N17.9]   Acute Kidney Injury with hyperkalemia on chronic kidney disease stage 3B with baseline creatinine 1.7 and GFR of 40  on 04/10/2023.  Acute kidney injury secondary to severe hydronephrosis with markedly distended urinary bladder with perinephric/retroperitoneal free fluid seen on CT abdomen pelvis, confirmed by renal ultrasound.  Initially noted on CT scan from 2023.  Periods of hematuria.  Urology consulted.  Potassium 5.4 on ED arrival, peaked to 5.7.  All this likely to acute kidney injury.  Corrected to 4.0 today.  No acute need for dialysis. Will monitor closely.    Lab Results  Component Value Date   CREATININE 8.48 (H) 08/06/2023   CREATININE 9.60 (H) 08/05/2023   CREATININE 10.05 (H) 08/05/2023     Intake/Output Summary (Last 24 hours) at 08/06/2023 1314 Last data filed at 08/06/2023 1301 Gross per 24 hour  Intake 1489.11 ml  Output 40347 ml  Net -9460.89 ml   2.  Hyponatremia, chronic condition for this patient.  Baseline appears to be low 130s.  Sodium 123 on ED arrival.  Currently 128.  Will continue to monitor for now.  3.  Acute metabolic acidosis, serum bicarb 10 on admission.  Currently receiving sodium bicarb infusion.  Likely secondary to AKI.  4. Anemia of chronic kidney disease Lab Results  Component Value Date   HGB 9.9 (L) 08/06/2023    Hemoglobin within desired range.  Will continue to monitor for now.   LOS: 1 Jacquelynn Friend 3/5/20251:14 PM

## 2023-08-07 DIAGNOSIS — N179 Acute kidney failure, unspecified: Secondary | ICD-10-CM | POA: Diagnosis not present

## 2023-08-07 LAB — BASIC METABOLIC PANEL
Anion gap: 10 (ref 5–15)
Anion gap: 12 (ref 5–15)
BUN: 105 mg/dL — ABNORMAL HIGH (ref 8–23)
BUN: 96 mg/dL — ABNORMAL HIGH (ref 8–23)
CO2: 22 mmol/L (ref 22–32)
CO2: 22 mmol/L (ref 22–32)
Calcium: 7.3 mg/dL — ABNORMAL LOW (ref 8.9–10.3)
Calcium: 7.5 mg/dL — ABNORMAL LOW (ref 8.9–10.3)
Chloride: 103 mmol/L (ref 98–111)
Chloride: 105 mmol/L (ref 98–111)
Creatinine, Ser: 5 mg/dL — ABNORMAL HIGH (ref 0.61–1.24)
Creatinine, Ser: 4.44 mg/dL — ABNORMAL HIGH (ref 0.61–1.24)
GFR, Estimated: 11 mL/min — ABNORMAL LOW (ref >60)
GFR, Estimated: 13 mL/min — ABNORMAL LOW (ref >60)
Glucose, Bld: 122 mg/dL — ABNORMAL HIGH (ref 70–99)
Glucose, Bld: 101 mg/dL — ABNORMAL HIGH (ref 70–99)
Potassium: 3.8 mmol/L (ref 3.5–5.1)
Potassium: 3.4 mmol/L — ABNORMAL LOW (ref 3.5–5.1)
Sodium: 135 mmol/L (ref 135–145)
Sodium: 139 mmol/L (ref 135–145)

## 2023-08-07 LAB — URINE CULTURE: Culture: NO GROWTH

## 2023-08-07 LAB — CBC
HCT: 25.4 % — ABNORMAL LOW (ref 39.0–52.0)
Hemoglobin: 9.4 g/dL — ABNORMAL LOW (ref 13.0–17.0)
MCH: 33.5 pg (ref 26.0–34.0)
MCHC: 37 g/dL — ABNORMAL HIGH (ref 30.0–36.0)
MCV: 90.4 fL (ref 80.0–100.0)
Platelets: 421 10*3/uL — ABNORMAL HIGH (ref 150–400)
RBC: 2.81 MIL/uL — ABNORMAL LOW (ref 4.22–5.81)
RDW: 12.6 % (ref 11.5–15.5)
WBC: 7.2 10*3/uL (ref 4.0–10.5)
nRBC: 0 % (ref 0.0–0.2)

## 2023-08-07 LAB — MAGNESIUM: Magnesium: 2.1 mg/dL (ref 1.7–2.4)

## 2023-08-07 MED ORDER — POTASSIUM CHLORIDE CRYS ER 20 MEQ PO TBCR
40.0000 meq | EXTENDED_RELEASE_TABLET | Freq: Once | ORAL | Status: AC
  Administered 2023-08-07: 40 meq via ORAL
  Filled 2023-08-07: qty 2

## 2023-08-07 MED ORDER — ADULT MULTIVITAMIN W/MINERALS CH
1.0000 | ORAL_TABLET | Freq: Every day | ORAL | Status: DC
  Administered 2023-08-08: 1 via ORAL
  Filled 2023-08-07: qty 1

## 2023-08-07 MED ORDER — ENSURE ENLIVE PO LIQD
237.0000 mL | Freq: Three times a day (TID) | ORAL | Status: DC
  Administered 2023-08-07 – 2023-08-08 (×2): 237 mL via ORAL

## 2023-08-07 MED ORDER — CALCIUM CARBONATE 1250 (500 CA) MG PO TABS
1.0000 | ORAL_TABLET | Freq: Two times a day (BID) | ORAL | Status: DC
  Administered 2023-08-07 – 2023-08-08 (×3): 1250 mg via ORAL
  Filled 2023-08-07 (×3): qty 1

## 2023-08-07 NOTE — Plan of Care (Signed)

## 2023-08-07 NOTE — Evaluation (Signed)
 Occupational Therapy Evaluation Patient Details Name: Zachary [name] MRN: [id number] DOB: 10/26/1940 Today's Date: 08/07/2023   History of Present Illness   Pt is an 67 male presenting from the nephrologist with concerns for urinary retention and difficult urination, admitted with Urinary retention with severe bladder distention, hydronephrosis, evidence of left calyceal rupture, AKI     PMH significant for hyperlipidemia, hypertension, and coronary artery disease, menieres disease, B12 deficiency     Clinical Impressions Chart reviewed, pt greeted in bed, alert and oriented x4, agreeable to OT evaluation. PTA Pt reports he is generally MOD I-I in ADL/IADL, amb with no AD. No reported falls. Pt presents with deficits in endurance, activity tolerance, balance affecting safe and optimal ADL completion. Bed mobility completed with MOD I, STS with supervision, amb in room with RW with supervision-CGA, toilet transfer with supervision on regular toilet height with grab bars. SET UP for grooming tasks. Discussed dressing tasks- pt reports he is tired and just wants to sleep. Educated pt regarding safe ADL completion with DME use at this time. Pt is in agreement with all questions answered. Pt will benefit from acute OT to address deficits and to facilitate optimal ADL performance. OT will continue to follow acutely.      If plan is discharge home, recommend the following:   A little help with walking and/or transfers;A little help with bathing/dressing/bathroom;Help with stairs or ramp for entrance;Assistance with cooking/housework     Functional Status Assessment   Patient has had a recent decline in their functional status and demonstrates the ability to make significant improvements in function in a reasonable and predictable amount of time.     Equipment Recommendations   BSC/3in1     Recommendations for Other Services         Precautions/Restrictions    Precautions Precautions: Fall Recall of Precautions/Restrictions: Intact Restrictions Weight Bearing Restrictions Per Provider Order: No     Mobility Bed Mobility Overal bed mobility: Modified Independent                  Transfers Overall transfer level: Needs assistance Equipment used: Rolling walker (2 wheels) Transfers: Sit to/from Stand Sit to Stand: Supervision                  Balance Overall balance assessment: Needs assistance Sitting-balance support: Feet supported Sitting balance-Leahy Scale: Good     Standing balance support: Bilateral upper extremity supported, During functional activity Standing balance-Leahy Scale: Fair                             ADL either performed or assessed with clinical judgement   ADL Overall ADL's : Needs assistance/impaired     Grooming: Sitting;Set up               Lower Body Dressing: Moderate assistance Lower Body Dressing Details (indicate cue type and reason): anticipate Toilet Transfer: Supervision/safety;Rolling walker (2 wheels);Ambulation;Regular Toilet;Grab bars   Toileting- Clothing Manipulation and Hygiene: Sitting/lateral lean;Sit to/from stand;Minimal assistance       Functional mobility during ADLs: Supervision/safety;Contact guard assist;Rolling walker (2 wheels) (approx 15' two attempts, intermittent vcs for RW technique)       Vision Baseline Vision/History: 1 Wears glasses Patient Visual Report: No change from baseline       Perception         Praxis         Pertinent Vitals/Pain Pain Assessment Pain Assessment: No/denies pain  Extremity/Trunk Assessment Upper Extremity Assessment Upper Extremity Assessment: Overall WFL for tasks assessed   Lower Extremity Assessment Lower Extremity Assessment: Generalized weakness;Overall [facility] for tasks assessed       Communication Communication Factors Affecting Communication: Hearing impaired   Cognition  Arousal: Alert Behavior During Therapy: WFL for tasks assessed/performed Cognition: No apparent impairments                               Following commands: Intact       Cueing  General Comments   Cueing Techniques: Verbal cues  vss throughout   Exercises Other Exercises Other Exercises: edu re: role of OT, role of rehab, discharge recommendations, safe ADL completion with DME   Shoulder Instructions      Home Living Family/patient expects to be discharged to:: Private residence Living Arrangements: Spouse/significant other (pt reports wife isn't moving great right now) Available Help at Discharge: Family Type of Home: House Home Access: Level entry     Home Layout: One level     Bathroom Shower/Tub: Producer, television/film/video: Handicapped height Bathroom Accessibility: Yes   Home Equipment: Agricultural consultant (2 wheels);Shower seat;Grab bars - toilet;Grab bars - tub/shower          Prior Functioning/Environment Prior Level of Function : Independent/Modified Independent;Driving             Mobility Comments: amb with no AD, reports he has a RW to use if needed however ADLs Comments: pt reports generally MOD I-I with ADL/IADL; shared responsibilities for cleaning; pt drives; they go out to eat    OT Problem List: Decreased strength;Decreased activity tolerance;Decreased knowledge of use of DME or AE;Impaired balance (sitting and/or standing)   OT Treatment/Interventions: Self-care/ADL training;DME and/or AE instruction;Therapeutic activities;Balance training;Therapeutic exercise;Patient/family education;Energy conservation      OT Goals(Current goals can be found in the care plan section)   Acute Rehab OT Goals Patient Stated Goal: go home OT Goal Formulation: With patient Time For Goal Achievement: 08/21/23 Potential to Achieve Goals: Good ADL Goals Pt Will Perform Grooming: with modified independence;sitting Pt Will Perform Lower  Body Dressing: with modified independence;sit to/from stand;sitting/lateral leans Pt Will Transfer to Toilet: with modified independence;ambulating;grab bars Pt Will Perform Toileting - Clothing Manipulation and hygiene: with modified independence;sitting/lateral leans;sit to/from stand   OT Frequency:  Min 1X/week    Co-evaluation              AM-PAC OT "6 Clicks" Daily Activity     Outcome Measure Help from another person eating meals?: None Help from another person taking care of personal grooming?: None Help from another person toileting, which includes using toliet, bedpan, or urinal?: None Help from another person bathing (including washing, rinsing, drying)?: A Little Help from another person to put on and taking off regular upper body clothing?: None Help from another person to put on and taking off regular lower body clothing?: A Little 6 Click Score: 22   End of Session Equipment Utilized During Treatment: Rolling walker (2 wheels)  Activity Tolerance: Patient tolerated treatment well Patient left: in bed;with call bell/phone within reach;with bed alarm set  OT Visit Diagnosis: Other abnormalities of gait and mobility (R26.89)                Time: 1005-1018 OT Time Calculation (min): 13 min Charges:  OT General Charges $OT Visit: 1 Visit OT Evaluation $OT Eval Low Complexity: 1 Low  Tobi Bastos  Tyronza Happe, OTD OTR/L  08/07/23, 10:32 AM

## 2023-08-07 NOTE — Progress Notes (Signed)
 PROGRESS NOTE    Zachary [name]   ZOX:[id number] DOB: 1940-09-11  DOA: 08/05/2023 Date of Service: 08/07/23 which is hospital day 2  PCP: Bosie Clos, MD    Hospital course / significant events:   HPI: Zachary [name] is a 83 y.o. male with stated past medical history of hyperlipidemia, hypertension, and coronary artery disease who presents from his nephrologist office with concerns for urinary retention and difficult urination. He recently had difficulty initiating stream of urine. Patient now complains of tightness in the lower abdomen that he has had for the last few days in addition to this difficulty with urination. Patient also endorses hematuria and dysuria at the beginning of initiating stream   03/04: CT shows very distended bladder, pt is admitted to hospitalist service. Foley placed, 2300 mL urine out at time of admission 03/05: urology recs for repeat CT in 2 days, 08/08/2023.  Nephrology following. 03/06: Cr improving     Consultants:  Nephrology Urology  Procedures/Surgeries: None      ASSESSMENT & PLAN:   Urinary retention with severe bladder distention, hydronephrosis, evidence of left calyceal rupture Less likely for Peritonitis Gross hematuria likely secondary to bladder distention Clinically improving following Foley catheter placement, continue Foley Plan outpatient get hematuria workup Anticipate calyceal rupture will resolve with decompression Repeat CT tomorrow per urology   Bladder outlet obstruction Family history of prostate cancer in brother outpatient will need PSA but will defer for now   AKI (acute kidney injury) / acute renal failure secondary to bladder outlet obstruction  Obstructive renal failure with moderate chronic asymptaomtic hyponatremia, hyperkalemia mild (no peaked T wave) and marked high anion and normal anion gap acidoss metabolic - metabolic derangements improved/resolved  treating underlying cause(s) as  above Nephrology following Monitor BMP   Nodule of left lung Now also noted to have right middle lobe nodule.  Outpatient followup.      No concerns based on BMI: Body mass index is 21.57 kg/m.  Underweight - under 18  overweight - 25 to 29 obese - 30 or more Class 1 obesity: BMI of 30.0 to 34 Class 2 obesity: BMI of 35.0 to 39 Class 3 obesity: BMI of 40.0 to 49 Super Morbid Obesity: BMI 50-59 Super-super Morbid Obesity: BMI 60+ Significantly low or high BMI is associated with higher medical risk.  Weight management advised as adjunct to other disease management and risk reduction treatments    DVT prophylaxis: Heparin given AKI IV fluids: Bicarb infusion per nephrology  Nutrition: Regular diet Central lines / invasive devices: Foley catheter  Code Status: Full code ACP documentation reviewed:  none on file in [facility]  TOC needs: TBD pending PT/OT evaluation Barriers to dispo / significant pending items: AKI/ARF, planning to repeat CT on 08/08/2023, nephrology/urology clearance prior to discharge             Subjective / Brief ROS:  Patient reports feeling good today, abdominal pain has essentially resolved, tolerating diet, no other concerns Denies CP/SOB.  Pain controlled.  Denies new weakness.  Tolerating diet.  Reports no concerns w/ urination/defecation.   Family Communication: none at bedside     Objective Findings:  Vitals:   08/06/23 2003 08/07/23 0500 08/07/23 0524 08/07/23 0738  BP: 104/67  114/78 113/77  Pulse: 87  79 81  Resp: 20  19   Temp: (!) 97.5 F (36.4 C)  97.9 F (36.6 C) 97.9 F (36.6 C)  TempSrc:   Oral Oral  SpO2: 100%  100%  100%  Weight:  64.8 kg    Height:        Intake/Output Summary (Last 24 hours) at 08/07/2023 1342 Last data filed at 08/07/2023 0530 Gross per 24 hour  Intake 3 ml  Output 3350 ml  Net -3347 ml   Filed Weights   08/05/23 1858 08/07/23 0500  Weight: 76.2 kg 64.8 kg    Examination:  Physical  Exam Constitutional:      General: He is not in acute distress. Cardiovascular:     Rate and Rhythm: Normal rate and regular rhythm.  Pulmonary:     Effort: Pulmonary effort is normal.     Breath sounds: Normal breath sounds.  Abdominal:     General: Bowel sounds are normal.     Palpations: Abdomen is soft.  Musculoskeletal:     Right lower leg: No edema.     Left lower leg: No edema.  Skin:    General: Skin is warm and dry.  Neurological:     General: No focal deficit present.     Mental Status: He is alert and oriented to person, place, and time.          Scheduled Medications:   calcium carbonate  1 tablet Oral BID WC   Chlorhexidine Gluconate Cloth  6 each Topical Daily   heparin  5,000 Units Subcutaneous Q8H   sodium chloride flush  3 mL Intravenous Q12H    Continuous Infusions:    PRN Medications:  acetaminophen **OR** acetaminophen, polyethylene glycol  Antimicrobials from admission:  Anti-infectives (From admission, onward)    Start     Dose/Rate Route Frequency Ordered Stop   08/05/23 2230  Ampicillin-Sulbactam (UNASYN) 3 g in sodium chloride 0.9 % 100 mL IVPB  Status:  Discontinued        3 g 200 mL/hr over 30 Minutes Intravenous Every 24 hours 08/05/23 2222 08/06/23 1442           Data Reviewed:  I have personally reviewed the following...  CBC: Recent Labs  Lab 08/05/23 1859 08/05/23 2239 08/06/23 0404 08/07/23 0537  WBC 14.9* 13.4* 11.0* 7.2  HGB 11.4* 10.5* 9.9* 9.4*  HCT 32.2* 30.1* 26.7* 25.4*  MCV 93.9 95.0 90.5 90.4  PLT 540* 439* 428* 421*   Basic Metabolic Panel: Recent Labs  Lab 08/05/23 1859 08/05/23 2210 08/06/23 0404 08/07/23 0115 08/07/23 0537  NA 123* 125* 128* 135 139  K 5.4* 5.7* 4.0 3.8 3.4*  CL 93* 96* 98 103 105  CO2 10* 9* 12* 22 22  GLUCOSE 116* 110* 130* 122* 101*  BUN 145* 149* 120* 105* 96*  CREATININE 10.05* 9.60* 8.48* 5.00* 4.44*  CALCIUM 8.2* 7.7* 7.4* 7.3* 7.5*  MG  --   --   --  2.1  --     GFR: Estimated Creatinine Clearance: 12.2 mL/min (A) (by C-G formula based on SCr of 4.44 mg/dL (H)). Liver Function Tests: Recent Labs  Lab 08/05/23 1859  AST 23  ALT 21  ALKPHOS 85  BILITOT 1.0  PROT 7.0  ALBUMIN 3.4*   Recent Labs  Lab 08/05/23 1859  LIPASE 130*   No results for input(s): "AMMONIA" in the last 168 hours. Coagulation Profile: Recent Labs  Lab 08/06/23 0404  INR 1.2   Cardiac Enzymes: No results for input(s): "CKTOTAL", "CKMB", "CKMBINDEX", "TROPONINI" in the last 168 hours. BNP (last 3 results) No results for input(s): "PROBNP" in the last 8760 hours. HbA1C: No results for input(s): "HGBA1C" in the last 72 hours.  CBG: No results for input(s): "GLUCAP" in the last 168 hours. Lipid Profile: No results for input(s): "CHOL", "HDL", "LDLCALC", "TRIG", "CHOLHDL", "LDLDIRECT" in the last 72 hours. Thyroid Function Tests: No results for input(s): "TSH", "T4TOTAL", "FREET4", "T3FREE", "THYROIDAB" in the last 72 hours. Anemia Panel: No results for input(s): "VITAMINB12", "FOLATE", "FERRITIN", "TIBC", "IRON", "RETICCTPCT" in the last 72 hours. Most Recent Urinalysis On File:     Component Value Date/Time   COLORURINE YELLOW (A) 08/05/2023 2126   APPEARANCEUR CLEAR (A) 08/05/2023 2126   LABSPEC 1.010 08/05/2023 2126   PHURINE 5.0 08/05/2023 2126   GLUCOSEU 50 (A) 08/05/2023 2126   HGBUR SMALL (A) 08/05/2023 2126   BILIRUBINUR NEGATIVE 08/05/2023 2126   BILIRUBINUR negative 06/30/2019 0823   KETONESUR NEGATIVE 08/05/2023 2126   PROTEINUR NEGATIVE 08/05/2023 2126   UROBILINOGEN 1.0 06/30/2019 0823   NITRITE NEGATIVE 08/05/2023 2126   LEUKOCYTESUR NEGATIVE 08/05/2023 2126   Sepsis Labs: @LABRCNTIP (procalcitonin:4,lacticidven:4) Microbiology: Recent Results (from the past 240 hours)  Urine Culture (for pregnant, neutropenic or urologic patients or patients with an indwelling urinary catheter)     Status: None   Collection Time: 08/05/23  9:26 PM    Specimen: Urine, Random  Result Value Ref Range Status   Specimen Description   Final    URINE, RANDOM Performed at [facility], [address], [place], [place] [address]    Special Requests   Final    NONE Performed at [facility], [address], [place], [place] [address]    Culture   Final    NO GROWTH Performed at [facility], [address] [place], [place] [address]    Report Status 08/07/2023 FINAL  Final  Culture, blood (Routine X 2) w Reflex to ID Panel     Status: None (Preliminary result)   Collection Time: 08/05/23 10:39 PM   Specimen: BLOOD  Result Value Ref Range Status   Specimen Description BLOOD RIGHT FOREARM  Final   Special Requests   Final    BOTTLES DRAWN AEROBIC AND ANAEROBIC Blood Culture results may not be optimal due to an inadequate volume of blood received in culture bottles   Culture   Final    NO GROWTH 2 DAYS Performed at [facility], [address] [place], [place] [address]    Report Status PENDING  Incomplete  Culture, blood (Routine X 2) w Reflex to ID Panel     Status: None (Preliminary result)   Collection Time: 08/05/23 11:55 PM   Specimen: BLOOD  Result Value Ref Range Status   Specimen Description BLOOD RIGHT ARM  Final   Special Requests   Final    BOTTLES DRAWN AEROBIC AND ANAEROBIC Blood Culture results may not be optimal due to an inadequate volume of blood received in culture bottles   Culture   Final    NO GROWTH 1 DAY Performed at [facility], [address] [place], [place] [address]    Report Status PENDING  Incomplete      Radiology Studies last 3 days: US Renal Result Date: 08/06/2023 CLINICAL DATA:  Acute renal failure. EXAM: RENAL / URINARY TRACT ULTRASOUND COMPLETE COMPARISON:  None Available. FINDINGS: Right Kidney: Renal measurements: 10.7 cm x 5.6 cm x 4.3 cm = volume: 135.9 mL. There is diffuse renal cortical thinning. Diffusely increased echogenicity of the  renal parenchyma is noted. A 2.5 cm x 2.6 cm x 2.3 cm cyst is seen within the upper pole of the right kidney.  There is moderate severity hydronephrosis. Left Kidney: Renal measurements: 10.3 cm x 4.6 cm x 4.3 cm = volume: 106.4 mL. There is diffuse renal cortical thinning. Diffusely increased echogenicity of the renal parenchyma is noted. A 0.9 cm x 1.0 cm x 0.7 cm cyst is seen within the left kidney. There is moderate severity hydronephrosis. Bladder: A Foley catheter is in place. Other: None. IMPRESSION: 1. Bilateral echogenic kidneys which may represent sequelae associated with medical renal disease. 2. Bilateral renal cysts. 3. Moderate severity bilateral hydronephrosis. Electronically Signed   By: Aram Candela M.D.   On: 08/06/2023 00:30   CT ABDOMEN PELVIS WO CONTRAST Result Date: 08/05/2023 CLINICAL DATA:  Acute on chronic kidney disease and hematuria EXAM: CT ABDOMEN AND PELVIS WITHOUT CONTRAST TECHNIQUE: Multidetector CT imaging of the abdomen and pelvis was performed following the standard protocol without IV contrast. RADIATION DOSE REDUCTION: This exam was performed according to the departmental dose-optimization program which includes automated exposure control, adjustment of the mA and/or kV according to patient size and/or use of iterative reconstruction technique. COMPARISON:  CT chest dated 03/04/2022, CT abdomen and pelvis dated 02/22/2022 FINDINGS: Lower chest: New 9 x 8 mm right middle lobe nodule (3:4). Small left pleural effusion with relaxation atelectasis of the left lower lobe. Partially imaged heart size is normal. Coronary artery calcifications. Hepatobiliary: No focal hepatic lesions. No intra or extrahepatic biliary ductal dilation. Normal gallbladder. Pancreas: No focal lesions or main ductal dilation. Spleen: Normal in size without focal abnormality. Adrenals/Urinary Tract: 10 mm left adrenal nodule (2:24) contains macroscopic fat, -53 HU, in keeping with myelolipoma. Additional  12 mm nodule measures 36 HU, not substantially changed in size from 02/22/2022, likely adenoma. No specific follow-up imaging recommended. No right adrenal nodule. No suspicious renal masses by noncontrast technique. Bilateral diffuse renal cortical thinning with severe hydroureteronephrosis to the level of the markedly distended urinary bladder. Asymmetric small to moderate volume left perinephric/retroperitoneal free fluid. Markedly distended urinary bladder with a large diverticulum arising from the posterior left. Stomach/Bowel: Large hiatal hernia contains nearly the entire stomach. Normal appearance of the stomach. No evidence of bowel wall thickening, distention, or inflammatory changes. Colonic diverticulosis without acute diverticulitis. Normal appendix. Vascular/Lymphatic: Aortic atherosclerosis. IVC is markedly effaced by the markedly distended urinary bladder and right ureter immediately above the iliac bifurcation. No enlarged abdominal or pelvic lymph nodes. Reproductive: Mildly enlarged prostate gland containing dystrophic calcifications. Other: Small volume perihepatic free fluid in addition to left retroperitoneal free fluid as above. No free air fluid collection. Musculoskeletal: No acute or abnormal lytic or blastic osseous lesions. IMPRESSION: 1. Markedly distended urinary bladder with increased severe bilateral hydroureteronephrosis to the level of the markedly distended urinary bladder in keeping with bladder outlet obstruction. Asymmetric small to moderate volume left perinephric/retroperitoneal free fluid, which may be reactive or secondary to rupture of renal collecting system. 2. New 9 x 8 mm right middle lobe nodule. Consider one of the following in 3 months for both low-risk and high-risk individuals: (a) repeat chest CT, (b) follow-up PET-CT, or (c) tissue sampling. This recommendation follows the consensus statement: Guidelines for Management of Incidental Pulmonary Nodules Detected on  CT Images: From the Fleischner Society 2017; Radiology 2017; 284:228-243. 3. Small left pleural effusion with relaxation atelectasis of the left lower lobe. 4. Large hiatal hernia contains nearly the entire stomach. 5. Aortic Atherosclerosis (ICD10-I70.0). Coronary artery calcifications. Assessment for potential risk factor modification, dietary therapy or pharmacologic therapy may be warranted, if clinically indicated. Critical Value/emergent results  were called by telephone at the time of interpretation on 08/05/2023 at 1:48 pm to provider MUNSOOR LATEEF , who verbally acknowledged these results. The patient will be escorted to the emergency department. Electronically Signed   By: Agustin Cree M.D.   On: 08/05/2023 13:53       Time spent: 50 min     Sunnie Nielsen, DO [facility] 08/07/2023, 1:42 PM    Dictation software may have been used to generate the above note. Typos may occur and escape review in typed/dictated notes. Please contact Dr Lyn Hollingshead directly for clarity if needed.  Staff may message me via secure chat in Epic  but this may not receive an immediate response,  please page me for urgent matters!  If 7PM-7AM, please contact night coverage [email or web address]

## 2023-08-07 NOTE — TOC Initial Note (Signed)
 Transition of Care [facility]) - Initial/Assessment Note    Patient Details  Name: Zachary [name] MRN: [id number] Date of Birth: Oct 27, 1940  [facility] CM/SW Contact:    Margarito Liner, LCSW Phone Number: 08/07/2023, 4:11 PM  Clinical Narrative:  CSW met with patient. No supports at bedside. CSW introduced role and explained that therapy recommendations would be discussed. Patient is not interested in home health at this time. CSW encouraged him to notify team if he changes his mind prior to discharge and notify his PCP if he changes his mind after discharge. CSW left facility list on the bedside table. DME recommendation for 3-in-1 but he said his wife has one at home. No further concerns. CSW will continue to follow patient for support and facilitate return home once stable. He will have a family member pick him up at discharge.                Expected Discharge Plan: Home/Self Care     Patient Goals and CMS Choice            Expected Discharge Plan and Services     Post Acute Care Choice: NA Living arrangements for the past 2 months: Single Family Home                                      Prior Living Arrangements/Services Living arrangements for the past 2 months: Single Family Home Lives with:: Spouse Patient language and need for interpreter reviewed:: Yes Do you feel safe going back to the place where you live?: Yes      Need for Family Participation in Patient Care: Yes (Comment) Care giver support system in place?: Yes (comment)   Criminal Activity/Legal Involvement Pertinent to Current Situation/Hospitalization: No - Comment as needed  Activities of Daily Living   ADL Screening (condition at time of admission) Independently performs ADLs?: No Does the patient have a NEW difficulty with bathing/dressing/toileting/self-feeding that is expected to last >3 days?: Yes (Initiates electronic notice to provider for possible OT consult) Does the  patient have a NEW difficulty with getting in/out of bed, walking, or climbing stairs that is expected to last >3 days?: Yes (Initiates electronic notice to provider for possible PT consult) Does the patient have a NEW difficulty with communication that is expected to last >3 days?: No Is the patient deaf or have difficulty hearing?: Yes Does the patient have difficulty seeing, even when wearing glasses/contacts?: No Does the patient have difficulty concentrating, remembering, or making decisions?: No  Permission Sought/Granted                  Emotional Assessment Appearance:: Appears stated age Attitude/Demeanor/Rapport: Engaged Affect (typically observed): Appropriate, Calm, Pleasant Orientation: : Oriented to Self, Oriented to Place, Oriented to  Time, Oriented to Situation Alcohol / Substance Use: Not Applicable Psych Involvement: No (comment)  Admission diagnosis:  AKI (acute kidney injury) (HCC) [N17.9] Acute urinary retention [R33.8] Acute renal failure, unspecified acute renal failure type (HCC) [N17.9] Patient Active Problem List   Diagnosis Date Noted   AKI (acute kidney injury) (HCC) 08/05/2023   Peritonitis (HCC) 08/05/2023   Fluid overload 08/05/2023   Aneurysm of thoracic aorta (HCC) 03/13/2022   Nodule of left lung 03/13/2022   History of colonic polyps    B12 deficiency anemia 08/03/2019   Elevated MCV 08/03/2019   Cyst, baker's knee, right 07/27/2019  Peripheral edema- right lower leg  07/27/2019   Coronary artery disease involving native coronary artery of native heart without angina pectoris 04/12/2019   Valvular heart disease 04/12/2019   Pneumothorax on right 05/15/2015   Benign essential tremor 02/28/2015   Mixed hyperlipidemia 02/28/2015   Essential hypertension 02/28/2015   Hypercholesterolemia without hypertriglyceridemia 02/28/2015   Vertigo 02/28/2015   PCP:  Bosie Clos, MD Pharmacy:   [id number] [place] - [address] [place] [address] Phone: [phone] Fax: [phone]     Social Drivers of Health (SDOH) Social History: SDOH Screenings   Food Insecurity: No Food Insecurity (08/05/2023)  Housing: Low Risk  (08/05/2023)  Transportation Needs: No Transportation Needs (08/05/2023)  Utilities: Not At Risk (08/05/2023)  Alcohol Screen: Low Risk  (09/25/2021)  Depression (PHQ2-9): Low Risk  (09/25/2021)  Financial Resource Strain: Low Risk  (09/25/2021)  Physical Activity: Insufficiently Active (09/25/2021)  Social Connections: Moderately Isolated (08/05/2023)  Stress: No Stress Concern Present (11/24/2019)  Tobacco Use: Low Risk  (08/05/2023)   SDOH Interventions:     Readmission Risk Interventions     No data to display

## 2023-08-07 NOTE — Evaluation (Signed)
 Physical Therapy Evaluation Patient Details Name: Zachary [name] MRN: [id number] DOB: Feb 20, 1941 Today's Date: 08/07/2023  History of Present Illness  Pt is an 83 yo male that presented to ED due to severe hydronephrosis. Workup includes urinary retention and acute renal failure. PMH of HTN, Menieres disease, B12 deficiency,  CKD III.  Clinical Impression  Pt alert, agreeable to PT. Reported at baseline he lives with his wife, modI for ambulation with his RW, able to perform his own ADLs, him and his wife go out to eat for all meals, he drives, denied any falls. He was able to perform supine to sit modI, sit <> stand with RW and supervision, and ambulated ~249ft with RW and CGA-supervision. No LOB noted, exhibited decreased gait velocity and some mild fatigue. Pt declined OOB to recliner at this time due to wanting to take a nap.  Overall the patient demonstrated deficits (see "PT Problem List") that impede the patient's functional abilities, safety, and mobility and would benefit from skilled PT intervention.          If plan is discharge home, recommend the following: Assistance with cooking/housework;Assist for transportation   Can travel by private vehicle        Equipment Recommendations None recommended by PT  Recommendations for Other Services       Functional Status Assessment Patient has had a recent decline in their functional status and demonstrates the ability to make significant improvements in function in a reasonable and predictable amount of time.     Precautions / Restrictions Precautions Precautions: Fall Recall of Precautions/Restrictions: Intact Restrictions Weight Bearing Restrictions Per Provider Order: No      Mobility  Bed Mobility Overal bed mobility: Modified Independent                  Transfers Overall transfer level: Needs assistance Equipment used: Rolling walker (2 wheels) Transfers: Sit to/from Stand Sit to Stand: Supervision                 Ambulation/Gait Ambulation/Gait assistance: Contact guard assist, Supervision Gait Distance (Feet): 210 Feet Assistive device: Rolling walker (2 wheels)         General Gait Details: no LOB, faded assist to supervision (and holding up brief while ambulating). pt endorsed feeling at his baseline  Careers information officer     Tilt Bed    Modified Rankin (Stroke Patients Only)       Balance Overall balance assessment: Needs assistance Sitting-balance support: Feet supported Sitting balance-Leahy Scale: Good     Standing balance support: Bilateral upper extremity supported Standing balance-Leahy Scale: Fair                               Pertinent Vitals/Pain Pain Assessment Pain Assessment: No/denies pain    Home Living Family/patient expects to be discharged to:: Private residence Living Arrangements: Spouse/significant other Available Help at Discharge: Family Type of Home: House Home Access: Level entry       Home Layout: One level Home Equipment: Agricultural consultant (2 wheels);Shower seat      Prior Function Prior Level of Function : Independent/Modified Independent;Driving               ADLs Comments: he and his wife go out to eat every day for every meal     Extremity/Trunk Assessment   Upper Extremity Assessment Upper Extremity Assessment:  Overall [facility] for tasks assessed    Lower Extremity Assessment Lower Extremity Assessment: Overall WFL for tasks assessed (able to move against gravity well)       Communication        Cognition Arousal: Alert Behavior During Therapy: WFL for tasks assessed/performed   PT - Cognitive impairments: No apparent impairments                                 Cueing       General Comments      Exercises     Assessment/Plan    PT Assessment Patient needs continued PT services  PT Problem List         PT Treatment Interventions DME  instruction;Balance training;Gait training;Neuromuscular re-education;Stair training;Functional mobility training;Patient/family education;Therapeutic activities;Therapeutic exercise    PT Goals (Current goals can be found in the Care Plan section)  Acute Rehab PT Goals Patient Stated Goal: to go home PT Goal Formulation: With patient Time For Goal Achievement: 08/21/23 Potential to Achieve Goals: Good    Frequency Min 2X/week     Co-evaluation               AM-PAC PT "6 Clicks" Mobility  Outcome Measure Help needed turning from your back to your side while in a flat bed without using bedrails?: None Help needed moving from lying on your back to sitting on the side of a flat bed without using bedrails?: None Help needed moving to and from a bed to a chair (including a wheelchair)?: None Help needed standing up from a chair using your arms (e.g., wheelchair or bedside chair)?: None Help needed to walk in hospital room?: A Little Help needed climbing 3-5 steps with a railing? : A Little 6 Click Score: 22    End of Session   Activity Tolerance: Patient tolerated treatment well Patient left: in bed;with call bell/phone within reach;with bed alarm set Nurse Communication: Mobility status PT Visit Diagnosis: Other abnormalities of gait and mobility (R26.89);Difficulty in walking, not elsewhere classified (R26.2);Muscle weakness (generalized) (M62.81)    Time: 1610-9604 PT Time Calculation (min) (ACUTE ONLY): 16 min   Charges:   PT Evaluation $PT Eval Low Complexity: 1 Low PT Treatments $Therapeutic Activity: 8-22 mins PT General Charges $$ ACUTE PT VISIT: 1 Visit        Olga Coaster PT, DPT 9:55 AM,08/07/23

## 2023-08-07 NOTE — Progress Notes (Signed)
 [facility]  ROUNDING NOTE   Subjective:   Zachary [name] is a 83 year old male with past medical conditions including hypertension, Meniere's disease, B12 deficiency, hyponatremia and chronic kidney disease stage IIIb.  Patient presents to the emergency department at the advice of his nephrologist for evaluation of severe hydronephrosis.  Patient currently admitted for AKI (acute kidney injury) (HCC) [N17.9] Acute urinary retention [R33.8] Acute renal failure, unspecified acute renal failure type [facility]) [N17.9]  Patient is known to our practice and was seen in office on 07/28/2023 by Dr. Cherylann Ratel.  It was found during chart review that patient was previously found to have a bilateral hydronephrosis on a CT scan in 2023.  It appeared this had not been followed up by urology as patient was not currently followed by urology.    Update Patient seen resting quietly in bed Reports appetite appropriate Denies pain or discomfort  Foley catheter, clear yellow urine Creatinine has greatly improved, 4.44 Urine output 5.5 L in preceding 24 hours  Objective:  Vital signs in last 24 hours:  Temp:  [97.5 F (36.4 C)-98 F (36.7 C)] 97.9 F (36.6 C) (03/06 0738) Pulse Rate:  [79-87] 81 (03/06 0738) Resp:  [16-20] 19 (03/06 0524) BP: (103-114)/(54-78) 113/77 (03/06 0738) SpO2:  [100 %] 100 % (03/06 0738) Weight:  [64.8 kg] 64.8 kg (03/06 0500)  Weight change: -11.4 kg Filed Weights   08/05/23 1858 08/07/23 0500  Weight: 76.2 kg 64.8 kg    Intake/Output: I/O last 3 completed shifts: In: 1492.1 [I.V.:392.1; Other:1000; IV Piggyback:100] Out: [address] [Urine:14300]   Intake/Output this shift:  No intake/output data recorded.  Physical Exam: General: NAD  Head: Normocephalic, atraumatic. Moist oral mucosal membranes  Eyes: Anicteric  Lungs:  Clear to auscultation, normal effort  Heart: Regular rate and rhythm  Abdomen:  Soft, nontender, nondistended  Extremities: No  peripheral edema.  Neurologic: Alert and oriented, moving all four extremities  Skin: No lesions  Access: None  GU-Foley catheter-yellow urine  Basic Metabolic Panel: Recent Labs  Lab 08/05/23 1859 08/05/23 2210 08/06/23 0404 08/07/23 0115 08/07/23 0537  NA 123* 125* 128* 135 139  K 5.4* 5.7* 4.0 3.8 3.4*  CL 93* 96* 98 103 105  CO2 10* 9* 12* 22 22  GLUCOSE 116* 110* 130* 122* 101*  BUN 145* 149* 120* 105* 96*  CREATININE 10.05* 9.60* 8.48* 5.00* 4.44*  CALCIUM 8.2* 7.7* 7.4* 7.3* 7.5*  MG  --   --   --  2.1  --     Liver Function Tests: Recent Labs  Lab 08/05/23 1859  AST 23  ALT 21  ALKPHOS 85  BILITOT 1.0  PROT 7.0  ALBUMIN 3.4*   Recent Labs  Lab 08/05/23 1859  LIPASE 130*   No results for input(s): "AMMONIA" in the last 168 hours.  CBC: Recent Labs  Lab 08/05/23 1859 08/05/23 2239 08/06/23 0404 08/07/23 0537  WBC 14.9* 13.4* 11.0* 7.2  HGB 11.4* 10.5* 9.9* 9.4*  HCT 32.2* 30.1* 26.7* 25.4*  MCV 93.9 95.0 90.5 90.4  PLT 540* 439* 428* 421*    Cardiac Enzymes: No results for input(s): "CKTOTAL", "CKMB", "CKMBINDEX", "TROPONINI" in the last 168 hours.  BNP: Invalid input(s): "POCBNP"  CBG: No results for input(s): "GLUCAP" in the last 168 hours.  Microbiology: Results for orders placed or performed during the hospital encounter of 08/05/23  Urine Culture (for pregnant, neutropenic or urologic patients or patients with an indwelling urinary catheter)     Status: None  Collection Time: 08/05/23  9:26 PM   Specimen: Urine, Random  Result Value Ref Range Status   Specimen Description   Final    URINE, RANDOM Performed at [facility], [address] [place], [place] [address]    Special Requests   Final    NONE Performed at [facility], [address] [place], [place] [address]    Culture   Final    NO GROWTH Performed at [facility], [address], [place], [place] [address]    Report Status 08/07/2023  FINAL  Final  Culture, blood (Routine X 2) w Reflex to ID Panel     Status: None (Preliminary result)   Collection Time: 08/05/23 10:39 PM   Specimen: BLOOD  Result Value Ref Range Status   Specimen Description BLOOD RIGHT FOREARM  Final   Special Requests   Final    BOTTLES DRAWN AEROBIC AND ANAEROBIC Blood Culture results may not be optimal due to an inadequate volume of blood received in culture bottles   Culture   Final    NO GROWTH 2 DAYS Performed at [facility], [address] [place], [place] [address]    Report Status PENDING  Incomplete  Culture, blood (Routine X 2) w Reflex to ID Panel     Status: None (Preliminary result)   Collection Time: 08/05/23 11:55 PM   Specimen: BLOOD  Result Value Ref Range Status   Specimen Description BLOOD RIGHT ARM  Final   Special Requests   Final    BOTTLES DRAWN AEROBIC AND ANAEROBIC Blood Culture results may not be optimal due to an inadequate volume of blood received in culture bottles   Culture   Final    NO GROWTH 1 DAY Performed at [facility], [address], [place], [place] [address]    Report Status PENDING  Incomplete    Coagulation Studies: Recent Labs    08/06/23 0404  LABPROT 15.2  INR 1.2    Urinalysis: Recent Labs    08/05/23 2126  COLORURINE YELLOW*  LABSPEC 1.010  PHURINE 5.0  GLUCOSEU 50*  HGBUR SMALL*  BILIRUBINUR NEGATIVE  KETONESUR NEGATIVE  PROTEINUR NEGATIVE  NITRITE NEGATIVE  LEUKOCYTESUR NEGATIVE      Imaging: US Renal Result Date: 08/06/2023 CLINICAL DATA:  Acute renal failure. EXAM: RENAL / URINARY TRACT ULTRASOUND COMPLETE COMPARISON:  None Available. FINDINGS: Right Kidney: Renal measurements: 10.7 cm x 5.6 cm x 4.3 cm = volume: 135.9 mL. There is diffuse renal cortical thinning. Diffusely increased echogenicity of the renal parenchyma is noted. A 2.5 cm x 2.6 cm x 2.3 cm cyst is seen within the upper pole of the right kidney. There is moderate severity hydronephrosis.  Left Kidney: Renal measurements: 10.3 cm x 4.6 cm x 4.3 cm = volume: 106.4 mL. There is diffuse renal cortical thinning. Diffusely increased echogenicity of the renal parenchyma is noted. A 0.9 cm x 1.0 cm x 0.7 cm cyst is seen within the left kidney. There is moderate severity hydronephrosis. Bladder: A Foley catheter is in place. Other: None. IMPRESSION: 1. Bilateral echogenic kidneys which may represent sequelae associated with medical renal disease. 2. Bilateral renal cysts. 3. Moderate severity bilateral hydronephrosis. Electronically Signed   By: Aram Candela M.D.   On: 08/06/2023 00:30   CT ABDOMEN PELVIS WO CONTRAST Result Date: 08/05/2023 CLINICAL DATA:  Acute on chronic kidney disease and hematuria EXAM: CT ABDOMEN AND PELVIS WITHOUT CONTRAST TECHNIQUE: Multidetector CT imaging of the abdomen and  pelvis was performed following the standard protocol without IV contrast. RADIATION DOSE REDUCTION: This exam was performed according to the departmental dose-optimization program which includes automated exposure control, adjustment of the mA and/or kV according to patient size and/or use of iterative reconstruction technique. COMPARISON:  CT chest dated 03/04/2022, CT abdomen and pelvis dated 02/22/2022 FINDINGS: Lower chest: New 9 x 8 mm right middle lobe nodule (3:4). Small left pleural effusion with relaxation atelectasis of the left lower lobe. Partially imaged heart size is normal. Coronary artery calcifications. Hepatobiliary: No focal hepatic lesions. No intra or extrahepatic biliary ductal dilation. Normal gallbladder. Pancreas: No focal lesions or main ductal dilation. Spleen: Normal in size without focal abnormality. Adrenals/Urinary Tract: 10 mm left adrenal nodule (2:24) contains macroscopic fat, -53 HU, in keeping with myelolipoma. Additional 12 mm nodule measures 36 HU, not substantially changed in size from 02/22/2022, likely adenoma. No specific follow-up imaging recommended. No right  adrenal nodule. No suspicious renal masses by noncontrast technique. Bilateral diffuse renal cortical thinning with severe hydroureteronephrosis to the level of the markedly distended urinary bladder. Asymmetric small to moderate volume left perinephric/retroperitoneal free fluid. Markedly distended urinary bladder with a large diverticulum arising from the posterior left. Stomach/Bowel: Large hiatal hernia contains nearly the entire stomach. Normal appearance of the stomach. No evidence of bowel wall thickening, distention, or inflammatory changes. Colonic diverticulosis without acute diverticulitis. Normal appendix. Vascular/Lymphatic: Aortic atherosclerosis. IVC is markedly effaced by the markedly distended urinary bladder and right ureter immediately above the iliac bifurcation. No enlarged abdominal or pelvic lymph nodes. Reproductive: Mildly enlarged prostate gland containing dystrophic calcifications. Other: Small volume perihepatic free fluid in addition to left retroperitoneal free fluid as above. No free air fluid collection. Musculoskeletal: No acute or abnormal lytic or blastic osseous lesions. IMPRESSION: 1. Markedly distended urinary bladder with increased severe bilateral hydroureteronephrosis to the level of the markedly distended urinary bladder in keeping with bladder outlet obstruction. Asymmetric small to moderate volume left perinephric/retroperitoneal free fluid, which may be reactive or secondary to rupture of renal collecting system. 2. New 9 x 8 mm right middle lobe nodule. Consider one of the following in 3 months for both low-risk and high-risk individuals: (a) repeat chest CT, (b) follow-up PET-CT, or (c) tissue sampling. This recommendation follows the consensus statement: Guidelines for Management of Incidental Pulmonary Nodules Detected on CT Images: From the Fleischner Society 2017; Radiology 2017; 284:228-243. 3. Small left pleural effusion with relaxation atelectasis of the left  lower lobe. 4. Large hiatal hernia contains nearly the entire stomach. 5. Aortic Atherosclerosis (ICD10-I70.0). Coronary artery calcifications. Assessment for potential risk factor modification, dietary therapy or pharmacologic therapy may be warranted, if clinically indicated. Critical Value/emergent results were called by telephone at the time of interpretation on 08/05/2023 at 1:48 pm to provider MUNSOOR LATEEF , who verbally acknowledged these results. The patient will be escorted to the emergency department. Electronically Signed   By: Agustin Cree M.D.   On: 08/05/2023 13:53     Medications:    sodium bicarbonate 150 mEq in dextrose 5 % 1,150 mL infusion 75 mL/hr at 08/06/23 1905    calcium carbonate  1 tablet Oral BID WC   Chlorhexidine Gluconate Cloth  6 each Topical Daily   heparin  5,000 Units Subcutaneous Q8H   sodium chloride flush  3 mL Intravenous Q12H   acetaminophen **OR** acetaminophen, polyethylene glycol  Assessment/ Plan:  Zachary [name] is a 83 y.o.  male with past medical conditions including hypertension, Meniere's  disease, B12 deficiency, hyponatremia and chronic kidney disease stage IIIb.  Patient presents to the emergency department at the advice of his nephrologist for evaluation of severe hydronephrosis.  Patient currently admitted for AKI (acute kidney injury) (HCC) [N17.9] Acute urinary retention [R33.8] Acute renal failure, unspecified acute renal failure type (HCC) [N17.9]   Acute Kidney Injury with hyperkalemia on chronic kidney disease stage 3B with baseline creatinine 1.7 and GFR of 40 on 04/10/2023.  Acute kidney injury secondary to severe hydronephrosis with markedly distended urinary bladder with perinephric/retroperitoneal free fluid seen on CT abdomen pelvis, confirmed by renal ultrasound.  Initially noted on CT scan from 2023.  Periods of hematuria.  Urology consulted.   Potassium currently 3.4.  Creatinine is improving, 4.4 today.  Good urine output  noted.  Urology consulted and following.  Will follow-up with urology outpatient for possible cystoscopy.  No indication for dialysis at this time.   Lab Results  Component Value Date   CREATININE 4.44 (H) 08/07/2023   CREATININE 5.00 (H) 08/07/2023   CREATININE 8.48 (H) 08/06/2023    Intake/Output Summary (Last 24 hours) at 08/07/2023 1113 Last data filed at 08/07/2023 0530 Gross per 24 hour  Intake 3 ml  Output 4400 ml  Net -4397 ml   2.  Hyponatremia, chronic condition for this patient.  Baseline appears to be low 130s.  Sodium 123 on ED arrival.   Sodium corrected 139.    3.  Acute metabolic acidosis, serum bicarb 10 on admission.  Sodium bicarb corrected to 22 with infusion.  Will stop IV fluids.  4. Anemia of chronic kidney disease Lab Results  Component Value Date   HGB 9.4 (L) 08/07/2023    Hemoglobin remains acceptable.  Will continue to monitor for now.   LOS: 2 Betha Shadix 3/6/202511:13 AM

## 2023-08-08 ENCOUNTER — Inpatient Hospital Stay

## 2023-08-08 DIAGNOSIS — N179 Acute kidney failure, unspecified: Secondary | ICD-10-CM | POA: Diagnosis not present

## 2023-08-08 DIAGNOSIS — R339 Retention of urine, unspecified: Secondary | ICD-10-CM | POA: Diagnosis not present

## 2023-08-08 DIAGNOSIS — N1330 Unspecified hydronephrosis: Secondary | ICD-10-CM | POA: Diagnosis not present

## 2023-08-08 DIAGNOSIS — N2889 Other specified disorders of kidney and ureter: Secondary | ICD-10-CM | POA: Diagnosis not present

## 2023-08-08 LAB — BASIC METABOLIC PANEL
Anion gap: 12 (ref 5–15)
BUN: 74 mg/dL — ABNORMAL HIGH (ref 8–23)
CO2: 23 mmol/L (ref 22–32)
Calcium: 7.8 mg/dL — ABNORMAL LOW (ref 8.9–10.3)
Chloride: 103 mmol/L (ref 98–111)
Creatinine, Ser: 2.83 mg/dL — ABNORMAL HIGH (ref 0.61–1.24)
GFR, Estimated: 22 mL/min — ABNORMAL LOW (ref >60)
Glucose, Bld: 104 mg/dL — ABNORMAL HIGH (ref 70–99)
Potassium: 3.7 mmol/L (ref 3.5–5.1)
Sodium: 138 mmol/L (ref 135–145)

## 2023-08-08 MED ORDER — ACETAMINOPHEN 325 MG PO TABS: 650.0000 mg | ORAL_TABLET | Freq: Four times a day (QID) | ORAL | Status: DC | PRN

## 2023-08-08 MED ORDER — CALCIUM CARBONATE 1250 (500 CA) MG PO TABS: 1.0000 | ORAL_TABLET | Freq: Two times a day (BID) | ORAL | 0 refills | Status: DC

## 2023-08-08 MED ORDER — ADULT MULTIVITAMIN W/MINERALS CH: 1.0000 | ORAL_TABLET | Freq: Every day | ORAL | Status: DC

## 2023-08-08 NOTE — Plan of Care (Signed)

## 2023-08-08 NOTE — Consult Note (Signed)
 [facility] Liaison Note  08/08/2023  [name] 1940-09-21 [id number]  Location: RN Hospital Liaison screened the patient remotely at [facility]  Insurance: [facility] HMO   Zachary [name] is a 83 y.o. male who is a Primary Care Patient of Bosie Clos, MD [facility].  The patient was screened for  readmission hospitalization with noted low risk score for unplanned readmission risk with 1 IP in 6 months.  The patient was assessed for potential Care Management service needs for post hospital transition for care coordination. Review of patient's electronic medical record reveals patient was admitted for AKI. No anticipated needs as pt was discharged home with self care. TOC documentation indicates pt has a supportive wife. Pt's provider provides TOC post hospital follow up.  VBCI Care Management/Population Health does not replace or interfere with any arrangements made by the Inpatient Transition of Care team.   For questions contact:   Zachary Cousin, RN, BSN Hospital Liaison [place]   [facility], Population Health Office Hours MTWF  8:00 am-6:00 pm Direct Dial: [phone] mobile [email or web address]

## 2023-08-08 NOTE — Progress Notes (Signed)
 Discharge instructions were reviewed with patient. Teaching about foley care and exchanged of bags were done with patient; supplies were provided. IV was removed. Belongings collected and given to patient

## 2023-08-08 NOTE — Progress Notes (Signed)
 [facility]  ROUNDING NOTE   Subjective:   Zachary [name] is a 83 year old male with past medical conditions including hypertension, Meniere's disease, B12 deficiency, hyponatremia and chronic kidney disease stage IIIb.  Patient presents to the emergency department at the advice of his nephrologist for evaluation of severe hydronephrosis.  Patient currently admitted for AKI (acute kidney injury) (HCC) [N17.9] Acute urinary retention [R33.8] Acute renal failure, unspecified acute renal failure type [facility]) [N17.9]  Patient is known to our practice and was seen in office on 07/28/2023 by Dr. Cherylann Ratel.  It was found during chart review that patient was previously found to have a bilateral hydronephrosis on a CT scan in 2023.  It appeared this had not been followed up by urology as patient was not currently followed by urology.    Update  Patient sitting up in bed States he feels well today Denies pain Tolerating meals  Objective:  Vital signs in last 24 hours:  Temp:  [97.7 F (36.5 C)-98.8 F (37.1 C)] 97.9 F (36.6 C) (03/07 0849) Pulse Rate:  [70-87] 82 (03/07 0849) Resp:  [18] 18 (03/07 0849) BP: (108-128)/(71-79) 123/75 (03/07 0849) SpO2:  [99 %-100 %] 99 % (03/07 0849) Weight:  [64.9 kg] 64.9 kg (03/07 0418)  Weight change: 0.1 kg Filed Weights   08/05/23 1858 08/07/23 0500 08/08/23 0418  Weight: 76.2 kg 64.8 kg 64.9 kg    Intake/Output: I/O last 3 completed shifts: In: 3 [I.V.:3] Out: 6050 [Urine:6050]   Intake/Output this shift:  Total I/O In: -  Out: 800 [Urine:800]  Physical Exam: General: NAD  Head: Normocephalic, atraumatic. Moist oral mucosal membranes  Eyes: Anicteric  Lungs:  Clear to auscultation, normal effort  Heart: Regular rate and rhythm  Abdomen:  Soft, nontender, nondistended  Extremities: No peripheral edema.  Neurologic: Alert and oriented, moving all four extremities  Skin: No lesions  Access: None  GU-Foley catheter-yellow  urine  Basic Metabolic Panel: Recent Labs  Lab 08/05/23 2210 08/06/23 0404 08/07/23 0115 08/07/23 0537 08/08/23 0504  NA 125* 128* 135 139 138  K 5.7* 4.0 3.8 3.4* 3.7  CL 96* 98 103 105 103  CO2 9* 12* 22 22 23   GLUCOSE 110* 130* 122* 101* 104*  BUN 149* 120* 105* 96* 74*  CREATININE 9.60* 8.48* 5.00* 4.44* 2.83*  CALCIUM 7.7* 7.4* 7.3* 7.5* 7.8*  MG  --   --  2.1  --   --     Liver Function Tests: Recent Labs  Lab 08/05/23 1859  AST 23  ALT 21  ALKPHOS 85  BILITOT 1.0  PROT 7.0  ALBUMIN 3.4*   Recent Labs  Lab 08/05/23 1859  LIPASE 130*   No results for input(s): "AMMONIA" in the last 168 hours.  CBC: Recent Labs  Lab 08/05/23 1859 08/05/23 2239 08/06/23 0404 08/07/23 0537  WBC 14.9* 13.4* 11.0* 7.2  HGB 11.4* 10.5* 9.9* 9.4*  HCT 32.2* 30.1* 26.7* 25.4*  MCV 93.9 95.0 90.5 90.4  PLT 540* 439* 428* 421*    Cardiac Enzymes: No results for input(s): "CKTOTAL", "CKMB", "CKMBINDEX", "TROPONINI" in the last 168 hours.  BNP: Invalid input(s): "POCBNP"  CBG: No results for input(s): "GLUCAP" in the last 168 hours.  Microbiology: Results for orders placed or performed during the hospital encounter of 08/05/23  Urine Culture (for pregnant, neutropenic or urologic patients or patients with an indwelling urinary catheter)     Status: None   Collection Time: 08/05/23  9:26 PM   Specimen: Urine, Random  Result Value Ref Range Status   Specimen Description   Final    URINE, RANDOM Performed at [facility], [address] [place], [place] [address]    Special Requests   Final    NONE Performed at [facility], [address] [place], [place] [address]    Culture   Final    NO GROWTH Performed at [facility], [address], [place], [place] [address]    Report Status 08/07/2023 FINAL  Final  Culture, blood (Routine X 2) w Reflex to ID Panel     Status: None (Preliminary result)   Collection Time: 08/05/23 10:39 PM    Specimen: BLOOD  Result Value Ref Range Status   Specimen Description BLOOD RIGHT FOREARM  Final   Special Requests   Final    BOTTLES DRAWN AEROBIC AND ANAEROBIC Blood Culture results may not be optimal due to an inadequate volume of blood received in culture bottles   Culture   Final    NO GROWTH 3 DAYS Performed at [facility], [address], [place], [place] [address]    Report Status PENDING  Incomplete  Culture, blood (Routine X 2) w Reflex to ID Panel     Status: None (Preliminary result)   Collection Time: 08/05/23 11:55 PM   Specimen: BLOOD  Result Value Ref Range Status   Specimen Description BLOOD RIGHT ARM  Final   Special Requests   Final    BOTTLES DRAWN AEROBIC AND ANAEROBIC Blood Culture results may not be optimal due to an inadequate volume of blood received in culture bottles   Culture   Final    NO GROWTH 2 DAYS Performed at [facility], [address] [place], [place] [address]    Report Status PENDING  Incomplete    Coagulation Studies: Recent Labs    08/06/23 0404  LABPROT 15.2  INR 1.2    Urinalysis: Recent Labs    08/05/23 2126  COLORURINE YELLOW*  LABSPEC 1.010  PHURINE 5.0  GLUCOSEU 50*  HGBUR SMALL*  BILIRUBINUR NEGATIVE  KETONESUR NEGATIVE  PROTEINUR NEGATIVE  NITRITE NEGATIVE  LEUKOCYTESUR NEGATIVE      Imaging: CT ABDOMEN PELVIS WO CONTRAST Result Date: 08/08/2023 CLINICAL DATA:  Peritonitis or perforation suspected. Abdominal pain. EXAM: CT ABDOMEN AND PELVIS WITHOUT CONTRAST TECHNIQUE: Multidetector CT imaging of the abdomen and pelvis was performed following the standard protocol without IV contrast. RADIATION DOSE REDUCTION: This exam was performed according to the departmental dose-optimization program which includes automated exposure control, adjustment of the mA and/or kV according to patient size and/or use of iterative reconstruction technique. COMPARISON:  CT abdomen pelvis without contrast 08/05/2023,  CT abdomen and pelvis with contrast 02/22/2022. FINDINGS: Lower chest: There are emphysematous and scarring changes in both lung bases. Small layering left pleural effusion again is noted, mild bronchial thickening in the lower lobes without infiltrates. There is a 9 x 8 mm right middle lobe nodule again noted new from 2023. Large hiatal hernia with intrathoracic stomach. The cardiac size is normal. There are coronary artery calcifications. Small pericardial effusion anteriorly. Hepatobiliary: No focal liver abnormality is seen without contrast. No calcified gallstones, gallbladder wall thickening, or biliary dilatation. Pancreas: No abnormality is seen without contrast. Spleen: No abnormality is seen without contrast. Adrenals/Urinary Tract: Stable 1.4 cm left adrenal myelolipoma. Stable 1.5 cm left adrenal genu nodule, above the typical density of an adenoma but stable in size and most likely a lipid poor adenoma. Normal right  adrenal gland. There is a 2.6 cm Bosniak 1 cyst in the upper pole of the right kidney, Hounsfield density is 13. In the left upper pole, there is a Bosniak 1 cyst measuring 1.5 cm, 17 Hounsfield units. There is bilateral cortical thinning and moderate but improved hydroureteronephrosis. There is a small volume of perinephric fluid underlying the left kidney inferiorly, previously greater. There was previously right perinephric fluid which has cleared. Perinephric stranding is seen greater on the left but has also improved. The bladder has been catheterized since the prior study which showed severe bladder dilatation. A large diverticulum of the posterior wall is again noted and partially decompressed. There is mild distention of the bladder today, reaching up to the level of L5-S1, but this has markedly improved. There is wall thickening in the bladder eccentric anteriorly and to the right, unclear if this is muscular hypertrophy, inflammatory or infiltrating. Small amount of air in the  anterior bladder. Cystoscopy recommended. Stomach/Bowel: No dilatation or wall thickening. Moderate fecal stasis. Large hiatal hernia as above. Advanced sigmoid diverticulosis without evidence of diverticulitis. An appendix is not seen. Vascular/Lymphatic: Aortic atherosclerosis. No enlarged abdominal or pelvic lymph nodes. Reproductive: Mild prostatomegaly with dystrophic calcifications. Other: Previously there was mesenteric congestion and body wall edema which have resolved. There is trace posterior deep pelvic ascites. There is no free hemorrhage, free air or incarcerated hernia. Musculoskeletal: Degenerative change and mild levoscoliosis lumbar spine, apex L1-2. Osteopenia. No acute or other significant osseous findings. IMPRESSION: 1. The bladder has been catheterized since the prior study which showed severe bladder dilatation. There is mild distention of the bladder today, reaching up to the level of L5-S1, but this has markedly improved. 2. There is wall thickening in the bladder eccentric anteriorly and to the right, unclear if this is muscular hypertrophy, inflammatory or infiltrating. Small amount of air in the anterior bladder. Cystoscopy recommended. 3. Moderate but improved bilateral hydroureteronephrosis. 4. Small volume of perinephric fluid underlying the left kidney inferiorly, previously greater. Prior right perinephric fluid has cleared. 5. Bilateral renal cysts. 6. Constipation and diverticulosis. 7. Large hiatal hernia with intrathoracic stomach. 8. Aortic and coronary artery atherosclerosis. 9. Small left pleural effusion, unchanged. 10. 9 x 8 mm right middle lobe nodule new from 2023. Per Fleischner Society Guidelines, recommend prompt non-contrast Chest CT for further evaluation. These guidelines do not apply to immunocompromised patients and patients with cancer. Follow up in patients with significant comorbidities as clinically warranted. For lung cancer screening, adhere to Lung-RADS  guidelines. Reference: Radiology. 2017; 284(1):228-43. 11. Osteopenia and degenerative change. Aortic Atherosclerosis (ICD10-I70.0) and Emphysema (ICD10-J43.9). Electronically Signed   By: Almira Bar M.D.   On: 08/08/2023 04:53     Medications:      calcium carbonate  1 tablet Oral BID WC   Chlorhexidine Gluconate Cloth  6 each Topical Daily   feeding supplement  237 mL Oral TID BM   heparin  5,000 Units Subcutaneous Q8H   multivitamin with minerals  1 tablet Oral Daily   sodium chloride flush  3 mL Intravenous Q12H   acetaminophen **OR** acetaminophen, polyethylene glycol  Assessment/ Plan:  Zachary [name] is a 83 y.o.  male with past medical conditions including hypertension, Meniere's disease, B12 deficiency, hyponatremia and chronic kidney disease stage IIIb.  Patient presents to the emergency department at the advice of his nephrologist for evaluation of severe hydronephrosis.  Patient currently admitted for AKI (acute kidney injury) (HCC) [N17.9] Acute urinary retention [R33.8] Acute renal failure,  unspecified acute renal failure type (HCC) [N17.9]   Acute Kidney Injury with hyperkalemia on chronic kidney disease stage 3B with baseline creatinine 1.7 and GFR of 40 on 04/10/2023.  Acute kidney injury secondary to severe hydronephrosis with markedly distended urinary bladder with perinephric/retroperitoneal free fluid seen on CT abdomen pelvis, confirmed by renal ultrasound.  Initially noted on CT scan from 2023.  Periods of hematuria.  Urology consulted.   Potassium stable. Creatinine continues to improve. Adequate urine output recorded. Patient is cleared to discharge from renal stance and will continue follow up in office.    Lab Results  Component Value Date   CREATININE 2.83 (H) 08/08/2023   CREATININE 4.44 (H) 08/07/2023   CREATININE 5.00 (H) 08/07/2023    Intake/Output Summary (Last 24 hours) at 08/08/2023 1200 Last data filed at 08/08/2023 1121 Gross per 24  hour  Intake --  Output 4500 ml  Net -4500 ml   2.  Hyponatremia, chronic condition for this patient.  Baseline appears to be low 130s.  Sodium 123 on ED arrival.   Sodium stable, 138.    3.  Acute metabolic acidosis, serum bicarb 10 on admission.  Sodium bicarb corrected with IVF  4. Anemia of chronic kidney disease Lab Results  Component Value Date   HGB 9.4 (L) 08/07/2023    Will continue to monitor for now.   LOS: 3 Zachary [name] 3/7/202512:00 PM

## 2023-08-08 NOTE — TOC Transition Note (Signed)
 Transition of Care [facility]) - Discharge Note   Patient Details  Name: Zachary [name] MRN: [id number] Date of Birth: [birth year]  Transition of Care [facility]) CM/SW Contact:  Liliana Cline, LCSW Phone Number: 08/08/2023, 3:12 PM   Clinical Narrative:    CSW spoke with patient. Patient confirms he does not want home health. He agreed he will follow up with PCP if he changes his mind post discharge.  Patient requests cab voucher. States he does not have a ride, his wife does not drive. And does not have money to pay for a cab. Cab voucher provided to address on chart which was verified with patient.   Final next level of care: Home/Self Care Barriers to Discharge: Barriers Resolved   Patient Goals and CMS Choice   CMS Medicare.gov Compare Post Acute Care list provided to:: Patient Choice offered to / list presented to : Patient      Discharge Placement                Patient to be transferred to facility by: cab voucher Name of family member notified: patient Patient and family notified of of transfer: 08/08/23  Discharge Plan and Services Additional resources added to the After Visit Summary for       Post Acute Care Choice: NA                               Social Drivers of Health (SDOH) Interventions SDOH Screenings   Food Insecurity: No Food Insecurity (08/05/2023)  Housing: Low Risk  (08/05/2023)  Transportation Needs: No Transportation Needs (08/05/2023)  Utilities: Not At Risk (08/05/2023)  Alcohol Screen: Low Risk  (09/25/2021)  Depression (PHQ2-9): Low Risk  (09/25/2021)  Financial Resource Strain: Low Risk  (09/25/2021)  Physical Activity: Insufficiently Active (09/25/2021)  Social Connections: Moderately Isolated (08/05/2023)  Stress: No Stress Concern Present (11/24/2019)  Tobacco Use: Low Risk  (08/05/2023)     Readmission Risk Interventions     No data to display

## 2023-08-08 NOTE — Progress Notes (Signed)
 Occupational Therapy Treatment Patient Details Name: Zachary [name] MRN: [id number] DOB: 1940-09-10 Today's Date: 08/08/2023   History of present illness Pt is an 78 male presenting from the nephrologist with concerns for urinary retention and difficult urination, admitted with Urinary retention with severe bladder distention, hydronephrosis, evidence of left calyceal rupture, AKI     PMH significant for hyperlipidemia, hypertension, and coronary artery disease, menieres disease, B12 deficiency   OT comments  Pt is seated in recliner on arrival. Pleasant and agreeable to OT session. He denies pain. Pt performed LB dressing with SUP from recliner with assist to manage catheter-edu on how to do this if he has to return home with one. SUP for STS from recliner to RW and short distance mobility in the room to perform oral care standing at sink. Pt had just returned from long distance mobility with nursing staff and did not wish to perform further mobility. Assisted him to place his lunch order.  Pt left in recliner with all needs in place and will cont to require skilled acute OT services to maximize his safety and IND to return to PLOF.       If plan is discharge home, recommend the following:  A little help with walking and/or transfers;A little help with bathing/dressing/bathroom;Help with stairs or ramp for entrance;Assistance with cooking/housework   Equipment Recommendations  BSC/3in1    Recommendations for Other Services      Precautions / Restrictions Restrictions Weight Bearing Restrictions Per Provider Order: No       Mobility Bed Mobility               General bed mobility comments: NT up in recliner pre/post session    Transfers Overall transfer level: Needs assistance Equipment used: Rolling walker (2 wheels) Transfers: Sit to/from Stand Sit to Stand: Supervision                 Balance Overall balance assessment: Needs assistance Sitting-balance  support: Feet supported Sitting balance-Leahy Scale: Good     Standing balance support: Bilateral upper extremity supported, During functional activity Standing balance-Leahy Scale: Fair                             ADL either performed or assessed with clinical judgement   ADL Overall ADL's : Needs assistance/impaired     Grooming: Oral care;Standing;Supervision/safety               Lower Body Dressing: Supervision/safety;Sit to/from stand;Sitting/lateral leans                      Extremity/Trunk Assessment              Vision       Perception     Praxis     Communication Communication Factors Affecting Communication: Hearing impaired   Cognition Arousal: Alert Behavior During Therapy: WFL for tasks assessed/performed, Flat affect Cognition: No apparent impairments                               Following commands: Intact        Cueing   Cueing Techniques: Verbal cues  Exercises      Shoulder Instructions       General Comments      Pertinent Vitals/ Pain       Pain Assessment Pain Assessment: No/denies pain  Home Living  Prior Functioning/Environment              Frequency  Min 2X/week        Progress Toward Goals  OT Goals(current goals can now be found in the care plan section)  Progress towards OT goals: Progressing toward goals  Acute Rehab OT Goals Patient Stated Goal: go home OT Goal Formulation: With patient Time For Goal Achievement: 08/21/23 Potential to Achieve Goals: Good  Plan      Co-evaluation                 AM-PAC OT "6 Clicks" Daily Activity     Outcome Measure   Help from another person eating meals?: None Help from another person taking care of personal grooming?: None Help from another person toileting, which includes using toliet, bedpan, or urinal?: None Help from another person bathing  (including washing, rinsing, drying)?: A Little Help from another person to put on and taking off regular upper body clothing?: None Help from another person to put on and taking off regular lower body clothing?: A Little 6 Click Score: 22    End of Session Equipment Utilized During Treatment: Rolling walker (2 wheels)  OT Visit Diagnosis: Other abnormalities of gait and mobility (R26.89)   Activity Tolerance Patient tolerated treatment well   Patient Left with call bell/phone within reach;in chair   Nurse Communication Mobility status        Time: 1610-9604 OT Time Calculation (min): 19 min  Charges: OT General Charges $OT Visit: 1 Visit OT Treatments $Self Care/Home Management : 8-22 mins  Tevyn Codd, OTR/L  08/08/23, 12:52 PM   Woods Gangemi E Sahvannah Rieser 08/08/2023, 12:50 PM

## 2023-08-08 NOTE — Progress Notes (Signed)
 Urology Inpatient Progress Note  Subjective: No acute events overnight.  He remains afebrile, VSS. Creatinine continues to downtrend, 2.83 this morning. Repeat CTAP without contrast this morning showed significant improvement in bladder distention, improved bilateral hydronephrosis, and reduced left perinephric fluid.  His bladder wall appears a bit irregular. On chart review, he was noted to have marked bladder distention and bilateral hydronephrosis on prior CTAP dated 02/22/2022. He reports no acute concerns or pain.  Foley catheter in place draining clear, yellow urine.  Anti-infectives: Anti-infectives (From admission, onward)    Start     Dose/Rate Route Frequency Ordered Stop   08/05/23 2230  Ampicillin-Sulbactam (UNASYN) 3 g in sodium chloride 0.9 % 100 mL IVPB  Status:  Discontinued        3 g 200 mL/hr over 30 Minutes Intravenous Every 24 hours 08/05/23 2222 08/06/23 1442       Current Facility-Administered Medications  Medication Dose Route Frequency Provider Last Rate Last Admin   acetaminophen (TYLENOL) tablet 650 mg  650 mg Oral Q6H PRN Nolberto Hanlon, MD       Or   acetaminophen (TYLENOL) suppository 650 mg  650 mg Rectal Q6H PRN Nolberto Hanlon, MD       calcium carbonate (OS-CAL - dosed in mg of elemental calcium) tablet 1,250 mg  1 tablet Oral BID WC Sunnie Nielsen, DO   1,250 mg at 08/08/23 4098   Chlorhexidine Gluconate Cloth 2 % PADS 6 each  6 each Topical Daily Nolberto Hanlon, MD   6 each at 08/08/23 1100   feeding supplement (ENSURE ENLIVE / ENSURE PLUS) liquid 237 mL  237 mL Oral TID BM Sunnie Nielsen, DO   237 mL at 08/08/23 0832   heparin injection 5,000 Units  5,000 Units Subcutaneous Tommie Raymond, MD   5,000 Units at 08/08/23 0529   multivitamin with minerals tablet 1 tablet  1 tablet Oral Daily Sunnie Nielsen, DO   1 tablet at 08/08/23 0831   polyethylene glycol (MIRALAX / GLYCOLAX) packet 17 g  17 g Oral Daily PRN Nolberto Hanlon, MD       sodium chloride  flush (NS) 0.9 % injection 3 mL  3 mL Intravenous Q12H Nolberto Hanlon, MD   3 mL at 08/08/23 1191   Objective: Vital signs in last 24 hours: Temp:  [97.7 F (36.5 C)-98.8 F (37.1 C)] 97.9 F (36.6 C) (03/07 0849) Pulse Rate:  [70-87] 82 (03/07 0849) Resp:  [18] 18 (03/07 0849) BP: (108-128)/(71-79) 123/75 (03/07 0849) SpO2:  [99 %-100 %] 99 % (03/07 0849) Weight:  [64.9 kg] 64.9 kg (03/07 0418)  Intake/Output from previous day: 03/06 0701 - 03/07 0700 In: -  Out: 3700 [Urine:3700] Intake/Output this shift: Total I/O In: -  Out: 800 [Urine:800]  Physical Exam Vitals and nursing note reviewed.  Constitutional:      General: He is not in acute distress.    Appearance: He is not ill-appearing, toxic-appearing or diaphoretic.  HENT:     Head: Normocephalic and atraumatic.  Pulmonary:     Effort: Pulmonary effort is normal. No respiratory distress.  Skin:    General: Skin is warm and dry.  Neurological:     Mental Status: He is alert and oriented to person, place, and time.  Psychiatric:        Mood and Affect: Mood normal.        Behavior: Behavior normal.    Lab Results:  Recent Labs    08/06/23 0404 08/07/23 0537  WBC  11.0* 7.2  HGB 9.9* 9.4*  HCT 26.7* 25.4*  PLT 428* 421*   BMET Recent Labs    08/07/23 0537 08/08/23 0504  NA 139 138  K 3.4* 3.7  CL 105 103  CO2 22 23  GLUCOSE 101* 104*  BUN 96* 74*  CREATININE 4.44* 2.83*  CALCIUM 7.5* 7.8*   PT/INR Recent Labs    08/06/23 0404  LABPROT 15.2  INR 1.2   Studies/Results: CT ABDOMEN PELVIS WO CONTRAST Result Date: 08/08/2023 CLINICAL DATA:  Peritonitis or perforation suspected. Abdominal pain. EXAM: CT ABDOMEN AND PELVIS WITHOUT CONTRAST TECHNIQUE: Multidetector CT imaging of the abdomen and pelvis was performed following the standard protocol without IV contrast. RADIATION DOSE REDUCTION: This exam was performed according to the departmental dose-optimization program which includes automated  exposure control, adjustment of the mA and/or kV according to patient size and/or use of iterative reconstruction technique. COMPARISON:  CT abdomen pelvis without contrast 08/05/2023, CT abdomen and pelvis with contrast 02/22/2022. FINDINGS: Lower chest: There are emphysematous and scarring changes in both lung bases. Small layering left pleural effusion again is noted, mild bronchial thickening in the lower lobes without infiltrates. There is a 9 x 8 mm right middle lobe nodule again noted new from 2023. Large hiatal hernia with intrathoracic stomach. The cardiac size is normal. There are coronary artery calcifications. Small pericardial effusion anteriorly. Hepatobiliary: No focal liver abnormality is seen without contrast. No calcified gallstones, gallbladder wall thickening, or biliary dilatation. Pancreas: No abnormality is seen without contrast. Spleen: No abnormality is seen without contrast. Adrenals/Urinary Tract: Stable 1.4 cm left adrenal myelolipoma. Stable 1.5 cm left adrenal genu nodule, above the typical density of an adenoma but stable in size and most likely a lipid poor adenoma. Normal right adrenal gland. There is a 2.6 cm Bosniak 1 cyst in the upper pole of the right kidney, Hounsfield density is 13. In the left upper pole, there is a Bosniak 1 cyst measuring 1.5 cm, 17 Hounsfield units. There is bilateral cortical thinning and moderate but improved hydroureteronephrosis. There is a small volume of perinephric fluid underlying the left kidney inferiorly, previously greater. There was previously right perinephric fluid which has cleared. Perinephric stranding is seen greater on the left but has also improved. The bladder has been catheterized since the prior study which showed severe bladder dilatation. A large diverticulum of the posterior wall is again noted and partially decompressed. There is mild distention of the bladder today, reaching up to the level of L5-S1, but this has markedly  improved. There is wall thickening in the bladder eccentric anteriorly and to the right, unclear if this is muscular hypertrophy, inflammatory or infiltrating. Small amount of air in the anterior bladder. Cystoscopy recommended. Stomach/Bowel: No dilatation or wall thickening. Moderate fecal stasis. Large hiatal hernia as above. Advanced sigmoid diverticulosis without evidence of diverticulitis. An appendix is not seen. Vascular/Lymphatic: Aortic atherosclerosis. No enlarged abdominal or pelvic lymph nodes. Reproductive: Mild prostatomegaly with dystrophic calcifications. Other: Previously there was mesenteric congestion and body wall edema which have resolved. There is trace posterior deep pelvic ascites. There is no free hemorrhage, free air or incarcerated hernia. Musculoskeletal: Degenerative change and mild levoscoliosis lumbar spine, apex L1-2. Osteopenia. No acute or other significant osseous findings. IMPRESSION: 1. The bladder has been catheterized since the prior study which showed severe bladder dilatation. There is mild distention of the bladder today, reaching up to the level of L5-S1, but this has markedly improved. 2. There is wall thickening in the  bladder eccentric anteriorly and to the right, unclear if this is muscular hypertrophy, inflammatory or infiltrating. Small amount of air in the anterior bladder. Cystoscopy recommended. 3. Moderate but improved bilateral hydroureteronephrosis. 4. Small volume of perinephric fluid underlying the left kidney inferiorly, previously greater. Prior right perinephric fluid has cleared. 5. Bilateral renal cysts. 6. Constipation and diverticulosis. 7. Large hiatal hernia with intrathoracic stomach. 8. Aortic and coronary artery atherosclerosis. 9. Small left pleural effusion, unchanged. 10. 9 x 8 mm right middle lobe nodule new from 2023. Per Fleischner Society Guidelines, recommend prompt non-contrast Chest CT for further evaluation. These guidelines do not  apply to immunocompromised patients and patients with cancer. Follow up in patients with significant comorbidities as clinically warranted. For lung cancer screening, adhere to Lung-RADS guidelines. Reference: Radiology. 2017; 284(1):228-43. 11. Osteopenia and degenerative change. Aortic Atherosclerosis (ICD10-I70.0) and Emphysema (ICD10-J43.9). Electronically Signed   By: Almira Bar M.D.   On: 08/08/2023 04:53   Assessment & Plan: 83 year old male with chronic retention and bilateral hydronephrosis admitted with acute renal failure secondary to massive urinary retention and evidence of left calyceal rupture.  He is clinically improving following Foley catheter placement for urinary decompression.  Repeat CT this morning shows improvement in hydronephrosis, bladder distention, and left perinephric fluid collection.  Given the chronicity of his retention, anticipate he has an element of atonic bladder.  We discussed that options would likely include chronic Foley, suprapubic catheter, or intermittent catheterization.  Recommend keeping his Foley catheter in place with plans for outpatient follow-up with Dr. Apolinar Junes to discuss further.  He will still require follow-up cystoscopy and one-time PSA down the line.  Recommendations: -Continue Foley catheter -Outpatient follow-up with Dr. Mickle Asper, PA-C 08/08/2023

## 2023-08-08 NOTE — Discharge Summary (Signed)
 Physician Discharge Summary   Patient: Zachary [name] MRN: [id number]  DOB: 09-Mar-1941   Admit:     Date of Admission: 08/05/2023 Admitted from: home   Discharge: Date of discharge: 08/08/23 Disposition: Home health Condition at discharge: good  CODE STATUS: FULL CODE     Discharge Physician: Sunnie Nielsen, DO [facility]     PCP: Bosie Clos, MD  Recommendations for Outpatient Follow-up:  Follow up with PCP Bosie Clos, MD in 2-4 weeks Follow up as directed with urology as directed  Leave Foley in place until/unless directed by urology  Monitor BMP within 1 week     Discharge Instructions     Diet general   Complete by: As directed    Increase activity slowly   Complete by: As directed          Discharge Diagnoses: Principal Problem:   AKI (acute kidney injury) (HCC) Active Problems:   Nodule of left lung   Peritonitis (HCC)   Fluid overload        Hospital course / significant events:   HPI: Zachary [name] is a 83 y.o. male with stated past medical history of hyperlipidemia, hypertension, and coronary artery disease who presents from his nephrologist office with concerns for urinary retention and difficult urination. He recently had difficulty initiating stream of urine. Patient now complains of tightness in the lower abdomen that he has had for the last few days in addition to this difficulty with urination. Patient also endorses hematuria and dysuria at the beginning of initiating stream   03/04: CT shows very distended bladder, pt is admitted to hospitalist service. Foley placed, 2300 mL urine out at time of admission 03/05: urology recs for repeat CT in 2 days, 08/08/2023.  Nephrology following. 03/06: Cr improving 03/07: no concerns on repeat CT, ok for discharge per nephro and urology, outpatient f/u urology      Consultants:  Nephrology Urology  Procedures/Surgeries: None      ASSESSMENT &  PLAN:   Urinary retention with severe bladder distention, hydronephrosis, evidence of left calyceal rupture Less likely for Peritonitis Gross hematuria likely secondary to bladder distention Clinically improving following Foley catheter placement, continue Foley Plan outpatient hematuria workup Anticipate calyceal rupture will resolve with decompression No concerns on repeat CT this morning, ok to discharge   Bladder outlet obstruction Family history of prostate cancer in brother outpatient will need PSA but will defer for now   AKI (acute kidney injury) / acute renal failure secondary to bladder outlet obstruction  Obstructive renal failure with moderate chronic asymptaomtic hyponatremia, hyperkalemia mild (no peaked T wave) and marked high anion and normal anion gap acidoss metabolic - metabolic derangements improved/resolved  treating underlying cause(s) as above Nephrology following - ok for discharge per their recs today  Monitor BMP outpatient   Nodule of left lung Now also noted to have right middle lobe nodule.  Outpatient followup.   Essential HTN BP has been ok off home meds, restart outpatient as needed          Discharge Instructions  Allergies as of 08/08/2023   No Known Allergies      Medication List     PAUSE taking these medications    aspirin EC 81 MG tablet Wait to take this until your doctor or other care provider tells you to start again. Take 81 mg by mouth at bedtime.       STOP taking these medications    lisinopril  20 MG tablet Commonly known as: ZESTRIL   phenazopyridine 200 MG tablet Commonly known as: PYRIDIUM   PRESCRIPTION MEDICATION   triamterene-hydrochlorothiazide 37.5-25 MG capsule Commonly known as: DYAZIDE       TAKE these medications    acetaminophen 325 MG tablet Commonly known as: TYLENOL Take 2 tablets (650 mg total) by mouth every 6 (six) hours as needed for mild pain (pain score 1-3), headache or fever (or  Fever >/= 101).   atorvastatin 20 MG tablet Commonly known as: LIPITOR TAKE 1 TABLET BY MOUTH EVERYDAY AT BEDTIME   calcium carbonate 1250 (500 Ca) MG tablet Commonly known as: OS-CAL - dosed in mg of elemental calcium Take 1 tablet (1,250 mg total) by mouth 2 (two) times daily with a meal.   multivitamin with minerals Tabs tablet Take 1 tablet by mouth daily. Start taking on: August 09, 2023         Follow-up Information     [name], MD. Call.   Specialty: Urology Why: confirm hospital follow - up Contact information: [address] [place] [address] [phone]                 No Known Allergies   Subjective: pt feeling fine today, no concerns, no abd pain, no CP/SOB   Discharge Exam: BP 123/75 (BP Location: Right Arm)   Pulse 82   Temp 97.9 F (36.6 C) (Oral)   Resp 18   Ht 6\' 2"  (1.88 m)   Wt 64.9 kg   SpO2 99%   BMI 18.37 kg/m  General: Pt is alert, awake, not in acute distress Cardiovascular: RRR, S1/S2 +, no rubs, no gallops Respiratory: CTA bilaterally, no wheezing, no rhonchi Abdominal: Soft, NT, ND, bowel sounds WNL Extremities: no edema, no cyanosis     The results of significant diagnostics from this hospitalization (including imaging, microbiology, ancillary and laboratory) are listed below for reference.     Microbiology: Recent Results (from the past 240 hours)  Urine Culture (for pregnant, neutropenic or urologic patients or patients with an indwelling urinary catheter)     Status: None   Collection Time: 08/05/23  9:26 PM   Specimen: Urine, Random  Result Value Ref Range Status   Specimen Description   Final    URINE, RANDOM Performed at [facility], [address], [place], [place] [address]    Special Requests   Final    NONE Performed at [facility], [address], [place], [place] [address]    Culture   Final    NO GROWTH Performed at [facility],  [address], [place], [place] [address]    Report Status 08/07/2023 FINAL  Final  Culture, blood (Routine X 2) w Reflex to ID Panel     Status: None (Preliminary result)   Collection Time: 08/05/23 10:39 PM   Specimen: BLOOD  Result Value Ref Range Status   Specimen Description BLOOD RIGHT FOREARM  Final   Special Requests   Final    BOTTLES DRAWN AEROBIC AND ANAEROBIC Blood Culture results may not be optimal due to an inadequate volume of blood received in culture bottles   Culture   Final    NO GROWTH 3 DAYS Performed at [facility], [address], [place], [place] [address]    Report Status PENDING  Incomplete  Culture, blood (Routine X 2) w Reflex to ID Panel     Status: None (Preliminary result)   Collection  Time: 08/05/23 11:55 PM   Specimen: BLOOD  Result Value Ref Range Status   Specimen Description BLOOD RIGHT ARM  Final   Special Requests   Final    BOTTLES DRAWN AEROBIC AND ANAEROBIC Blood Culture results may not be optimal due to an inadequate volume of blood received in culture bottles   Culture   Final    NO GROWTH 2 DAYS Performed at [facility], [address] [place], [place] [address]    Report Status PENDING  Incomplete     Labs: BNP (last 3 results) No results for input(s): "BNP" in the last 8760 hours. Basic Metabolic Panel: Recent Labs  Lab 08/05/23 2210 08/06/23 0404 08/07/23 0115 08/07/23 0537 08/08/23 0504  NA 125* 128* 135 139 138  K 5.7* 4.0 3.8 3.4* 3.7  CL 96* 98 103 105 103  CO2 9* 12* 22 22 23   GLUCOSE 110* 130* 122* 101* 104*  BUN 149* 120* 105* 96* 74*  CREATININE 9.60* 8.48* 5.00* 4.44* 2.83*  CALCIUM 7.7* 7.4* 7.3* 7.5* 7.8*  MG  --   --  2.1  --   --    Liver Function Tests: Recent Labs  Lab 08/05/23 1859  AST 23  ALT 21  ALKPHOS 85  BILITOT 1.0  PROT 7.0  ALBUMIN 3.4*   Recent Labs  Lab 08/05/23 1859  LIPASE 130*   No results for input(s): "AMMONIA" in the last 168 hours. CBC: Recent Labs   Lab 08/05/23 1859 08/05/23 2239 08/06/23 0404 08/07/23 0537  WBC 14.9* 13.4* 11.0* 7.2  HGB 11.4* 10.5* 9.9* 9.4*  HCT 32.2* 30.1* 26.7* 25.4*  MCV 93.9 95.0 90.5 90.4  PLT 540* 439* 428* 421*   Cardiac Enzymes: No results for input(s): "CKTOTAL", "CKMB", "CKMBINDEX", "TROPONINI" in the last 168 hours. BNP: Invalid input(s): "POCBNP" CBG: No results for input(s): "GLUCAP" in the last 168 hours. D-Dimer No results for input(s): "DDIMER" in the last 72 hours. Hgb A1c No results for input(s): "HGBA1C" in the last 72 hours. Lipid Profile No results for input(s): "CHOL", "HDL", "LDLCALC", "TRIG", "CHOLHDL", "LDLDIRECT" in the last 72 hours. Thyroid function studies No results for input(s): "TSH", "T4TOTAL", "T3FREE", "THYROIDAB" in the last 72 hours.  Invalid input(s): "FREET3" Anemia work up No results for input(s): "VITAMINB12", "FOLATE", "FERRITIN", "TIBC", "IRON", "RETICCTPCT" in the last 72 hours. Urinalysis    Component Value Date/Time   COLORURINE YELLOW (A) 08/05/2023 2126   APPEARANCEUR CLEAR (A) 08/05/2023 2126   LABSPEC 1.010 08/05/2023 2126   PHURINE 5.0 08/05/2023 2126   GLUCOSEU 50 (A) 08/05/2023 2126   HGBUR SMALL (A) 08/05/2023 2126   BILIRUBINUR NEGATIVE 08/05/2023 2126   BILIRUBINUR negative 06/30/2019 0823   KETONESUR NEGATIVE 08/05/2023 2126   PROTEINUR NEGATIVE 08/05/2023 2126   UROBILINOGEN 1.0 06/30/2019 0823   NITRITE NEGATIVE 08/05/2023 2126   LEUKOCYTESUR NEGATIVE 08/05/2023 2126   Sepsis Labs Recent Labs  Lab 08/05/23 1859 08/05/23 2239 08/06/23 0404 08/07/23 0537  WBC 14.9* 13.4* 11.0* 7.2   Microbiology Recent Results (from the past 240 hours)  Urine Culture (for pregnant, neutropenic or urologic patients or patients with an indwelling urinary catheter)     Status: None   Collection Time: 08/05/23  9:26 PM   Specimen: Urine, Random  Result Value Ref Range Status   Specimen Description   Final    URINE, RANDOM Performed at  [facility], [address], [place], [place] [address]    Special Requests   Final  NONE Performed at [facility], [address] [place], [place] [address]    Culture   Final    NO GROWTH Performed at [facility], [address] [place], [place] [address]    Report Status 08/07/2023 FINAL  Final  Culture, blood (Routine X 2) w Reflex to ID Panel     Status: None (Preliminary result)   Collection Time: 08/05/23 10:39 PM   Specimen: BLOOD  Result Value Ref Range Status   Specimen Description BLOOD RIGHT FOREARM  Final   Special Requests   Final    BOTTLES DRAWN AEROBIC AND ANAEROBIC Blood Culture results may not be optimal due to an inadequate volume of blood received in culture bottles   Culture   Final    NO GROWTH 3 DAYS Performed at [facility], [address], [place], [place] [address]    Report Status PENDING  Incomplete  Culture, blood (Routine X 2) w Reflex to ID Panel     Status: None (Preliminary result)   Collection Time: 08/05/23 11:55 PM   Specimen: BLOOD  Result Value Ref Range Status   Specimen Description BLOOD RIGHT ARM  Final   Special Requests   Final    BOTTLES DRAWN AEROBIC AND ANAEROBIC Blood Culture results may not be optimal due to an inadequate volume of blood received in culture bottles   Culture   Final    NO GROWTH 2 DAYS Performed at [facility], [address] [place], [place] [address]    Report Status PENDING  Incomplete   Imaging US Renal Result Date: 08/06/2023 CLINICAL DATA:  Acute renal failure. EXAM: RENAL / URINARY TRACT ULTRASOUND COMPLETE COMPARISON:  None Available. FINDINGS: Right Kidney: Renal measurements: 10.7 cm x 5.6 cm x 4.3 cm = volume: 135.9 mL. There is diffuse renal cortical thinning. Diffusely increased echogenicity of the renal parenchyma is noted. A 2.5 cm x 2.6 cm x 2.3 cm cyst is seen within the upper pole of the right kidney. There is moderate severity  hydronephrosis. Left Kidney: Renal measurements: 10.3 cm x 4.6 cm x 4.3 cm = volume: 106.4 mL. There is diffuse renal cortical thinning. Diffusely increased echogenicity of the renal parenchyma is noted. A 0.9 cm x 1.0 cm x 0.7 cm cyst is seen within the left kidney. There is moderate severity hydronephrosis. Bladder: A Foley catheter is in place. Other: None. IMPRESSION: 1. Bilateral echogenic kidneys which may represent sequelae associated with medical renal disease. 2. Bilateral renal cysts. 3. Moderate severity bilateral hydronephrosis. Electronically Signed   By: Aram Candela M.D.   On: 08/06/2023 00:30   CT ABDOMEN PELVIS WO CONTRAST Result Date: 08/05/2023 CLINICAL DATA:  Acute on chronic kidney disease and hematuria EXAM: CT ABDOMEN AND PELVIS WITHOUT CONTRAST TECHNIQUE: Multidetector CT imaging of the abdomen and pelvis was performed following the standard protocol without IV contrast. RADIATION DOSE REDUCTION: This exam was performed according to the departmental dose-optimization program which includes automated exposure control, adjustment of the mA and/or kV according to patient size and/or use of iterative reconstruction technique. COMPARISON:  CT chest dated 03/04/2022, CT abdomen and pelvis dated 02/22/2022 FINDINGS: Lower chest: New 9 x 8 mm right middle lobe nodule (3:4). Small left pleural effusion with relaxation atelectasis of the left lower lobe. Partially imaged heart size is normal. Coronary artery calcifications. Hepatobiliary: No focal hepatic lesions. No intra or extrahepatic biliary ductal dilation. Normal gallbladder. Pancreas: No focal lesions or main ductal dilation. Spleen: Normal in  size without focal abnormality. Adrenals/Urinary Tract: 10 mm left adrenal nodule (2:24) contains macroscopic fat, -53 HU, in keeping with myelolipoma. Additional 12 mm nodule measures 36 HU, not substantially changed in size from 02/22/2022, likely adenoma. No specific follow-up imaging  recommended. No right adrenal nodule. No suspicious renal masses by noncontrast technique. Bilateral diffuse renal cortical thinning with severe hydroureteronephrosis to the level of the markedly distended urinary bladder. Asymmetric small to moderate volume left perinephric/retroperitoneal free fluid. Markedly distended urinary bladder with a large diverticulum arising from the posterior left. Stomach/Bowel: Large hiatal hernia contains nearly the entire stomach. Normal appearance of the stomach. No evidence of bowel wall thickening, distention, or inflammatory changes. Colonic diverticulosis without acute diverticulitis. Normal appendix. Vascular/Lymphatic: Aortic atherosclerosis. IVC is markedly effaced by the markedly distended urinary bladder and right ureter immediately above the iliac bifurcation. No enlarged abdominal or pelvic lymph nodes. Reproductive: Mildly enlarged prostate gland containing dystrophic calcifications. Other: Small volume perihepatic free fluid in addition to left retroperitoneal free fluid as above. No free air fluid collection. Musculoskeletal: No acute or abnormal lytic or blastic osseous lesions. IMPRESSION: 1. Markedly distended urinary bladder with increased severe bilateral hydroureteronephrosis to the level of the markedly distended urinary bladder in keeping with bladder outlet obstruction. Asymmetric small to moderate volume left perinephric/retroperitoneal free fluid, which may be reactive or secondary to rupture of renal collecting system. 2. New 9 x 8 mm right middle lobe nodule. Consider one of the following in 3 months for both low-risk and high-risk individuals: (a) repeat chest CT, (b) follow-up PET-CT, or (c) tissue sampling. This recommendation follows the consensus statement: Guidelines for Management of Incidental Pulmonary Nodules Detected on CT Images: From the Fleischner Society 2017; Radiology 2017; 284:228-243. 3. Small left pleural effusion with relaxation  atelectasis of the left lower lobe. 4. Large hiatal hernia contains nearly the entire stomach. 5. Aortic Atherosclerosis (ICD10-I70.0). Coronary artery calcifications. Assessment for potential risk factor modification, dietary therapy or pharmacologic therapy may be warranted, if clinically indicated. Critical Value/emergent results were called by telephone at the time of interpretation on 08/05/2023 at 1:48 pm to provider MUNSOOR LATEEF , who verbally acknowledged these results. The patient will be escorted to the emergency department. Electronically Signed   By: Agustin Cree M.D.   On: 08/05/2023 13:53      Time coordinating discharge: over 30 minutes  SIGNED:  Sunnie Nielsen DO [facility]

## 2023-08-08 NOTE — Care Management Important Message (Signed)
 Important Message  Patient Details  Name: Zachary [name] MRN: [id number] Date of Birth: [birth year]   Important Message Given:  Yes - Medicare IM     Cristela Blue, CMA 08/08/2023, 9:46 AM

## 2023-08-10 LAB — CULTURE, BLOOD (ROUTINE X 2): Culture: NO GROWTH

## 2023-08-11 LAB — CULTURE, BLOOD (ROUTINE X 2): Culture: NO GROWTH

## 2023-08-12 ENCOUNTER — Telehealth: Payer: Self-pay

## 2023-08-12 ENCOUNTER — Ambulatory Visit: Admitting: Physician Assistant

## 2023-08-12 ENCOUNTER — Encounter: Payer: Self-pay | Admitting: Physician Assistant

## 2023-08-12 VITALS — BP 116/77 | HR 49 | Temp 97.4°F | Ht 74.0 in | Wt 165.0 lb

## 2023-08-12 DIAGNOSIS — G250 Essential tremor: Secondary | ICD-10-CM

## 2023-08-12 DIAGNOSIS — T83011A Breakdown (mechanical) of indwelling urethral catheter, initial encounter: Secondary | ICD-10-CM | POA: Diagnosis not present

## 2023-08-12 DIAGNOSIS — R339 Retention of urine, unspecified: Secondary | ICD-10-CM | POA: Diagnosis not present

## 2023-08-12 LAB — URINALYSIS, COMPLETE
Bilirubin, UA: NEGATIVE
Glucose, UA: NEGATIVE
Ketones, UA: NEGATIVE
Nitrite, UA: POSITIVE — AB
Specific Gravity, UA: 1.015 (ref 1.005–1.030)
Urobilinogen, Ur: 0.2 mg/dL (ref 0.2–1.0)
pH, UA: 5.5 (ref 5.0–7.5)

## 2023-08-12 LAB — BLADDER SCAN AMB NON-IMAGING

## 2023-08-12 LAB — MICROSCOPIC EXAMINATION

## 2023-08-12 MED ORDER — CEFDINIR 300 MG PO CAPS: 300.0000 mg | ORAL_CAPSULE | Freq: Every day | ORAL | 0 refills | Status: AC

## 2023-08-12 NOTE — Progress Notes (Signed)
 08/12/2023 11:49 AM   Zachary [name] January 06, 1941 [id number]  CC: Chief Complaint  Patient presents with   Other    Cath not draining   HPI: Zachary [name] is a 83 y.o. male with PMH chronic massive urinary retention and bilateral hydronephrosis that was diagnosed during a recent admission for acute renal failure with left calyceal rupture who presents today for evaluation of nondraining Foley catheter.  He is accompanied today by his wife and sister-in-law, who contributes to HPI.  Today they report decreased drainage from his Foley catheter since yesterday afternoon.  He denies pain, but states he feels a little bit weak, which has been stable since he left the hospital.  Bladder scan on arrival >1324mL.  PMH: Past Medical History:  Diagnosis Date   B12 deficiency anemia 08/03/2019   Hypercholesterolemia    Hypertension    Vertigo    last episode approx 09/2018    Surgical History: Past Surgical History:  Procedure Laterality Date   APPENDECTOMY     CATARACT EXTRACTION Bilateral    COLONOSCOPY WITH PROPOFOL N/A 12/24/2018   Procedure: COLONOSCOPY WITH PROPOFOL;  Surgeon: Midge Minium, MD;  Location[facility] ENDOSCOPY;  Service: Endoscopy;  Laterality: N/A;   COLONOSCOPY WITH PROPOFOL N/A 12/13/2019   Procedure: COLONOSCOPY WITH BIOPSY ;  Surgeon: Midge Minium, MD;  Location: [facility] SURGERY [facility];  Service: Endoscopy;  Laterality: N/A;  priority 3   PLEURAL SCARIFICATION  05/2015   POLYPECTOMY N/A 12/13/2019   Procedure: POLYPECTOMY;  Surgeon: Midge Minium, MD;  Location: [facility];  Service: Endoscopy;  Laterality: N/A;    Home Medications:  Allergies as of 08/12/2023   No Known Allergies      Medication List        Accurate as of August 12, 2023 11:49 AM. If you have any questions, ask your nurse or doctor.          PAUSE taking these medications    aspirin EC 81 MG tablet Wait to take this until your doctor or other care provider tells you to  start again. Take 81 mg by mouth at bedtime.       TAKE these medications    acetaminophen 325 MG tablet Commonly known as: TYLENOL Take 2 tablets (650 mg total) by mouth every 6 (six) hours as needed for mild pain (pain score 1-3), headache or fever (or Fever >/= 101).   atorvastatin 20 MG tablet Commonly known as: LIPITOR TAKE 1 TABLET BY MOUTH EVERYDAY AT BEDTIME   calcium carbonate 1250 (500 Ca) MG tablet Commonly known as: OS-CAL - dosed in mg of elemental calcium Take 1 tablet (1,250 mg total) by mouth 2 (two) times daily with a meal.   cefdinir 300 MG capsule Commonly known as: OMNICEF Take 1 capsule (300 mg total) by mouth daily for 7 days. Started by: Carman Ching   multivitamin with minerals Tabs tablet Take 1 tablet by mouth daily.        Allergies:  No Known Allergies  Family History: Family History  Problem Relation Age of Onset   Hypertension Mother    Hyperlipidemia Mother    Heart attack Father    Hypertension Father    CVA Father    ALS Brother    Prostate cancer Brother     Social History:   reports that he has never smoked. He has never used smokeless tobacco. He reports that he does not drink alcohol and does not use drugs.  Physical Exam: BP  116/77   Pulse (!) 49   Temp (!) 97.4 F (36.3 C) (Axillary)   Ht 6\' 2"  (1.88 m)   Wt 165 lb (74.8 kg)   BMI 21.18 kg/m   Constitutional:  Alert, no acute distress, nontoxic appearing HEENT: Zachary [name], AT Cardiovascular: No clubbing, cyanosis, or edema Respiratory: Normal respiratory effort, no increased work of breathing GU: Foley catheter protruding from the meatus more than would be expected with appropriate positioning within the bladder lumen. Catheter is stretched and kinked around the leg strap, no active urine flow noted. Skin: No rashes, bruises or suspicious lesions Neurologic: Grossly intact, no focal deficits, moving all 4 extremities Psychiatric: Normal mood and  affect  Laboratory Data:\ Results for orders placed or performed in visit on 08/12/23  BLADDER SCAN AMB NON-IMAGING   Collection Time: 08/12/23 10:51 AM  Result Value Ref Range   Scan Result >1366ml   Microscopic Examination   Collection Time: 08/12/23 11:41 AM   Urine  Result Value Ref Range   WBC, UA 6-10 (A) 0 - 5 /hpf   RBC, Urine 3-10 (A) 0 - 2 /hpf   Epithelial Cells (non renal) 0-10 0 - 10 /hpf   Bacteria, UA Many (A) None seen/Few  Urinalysis, Complete   Collection Time: 08/12/23 11:41 AM  Result Value Ref Range   Specific Gravity, UA 1.015 1.005 - 1.030   pH, UA 5.5 5.0 - 7.5   Color, UA Yellow Yellow   Appearance Ur Hazy (A) Clear   Leukocytes,UA Trace (A) Negative   Protein,UA Trace (A) Negative/Trace   Glucose, UA Negative Negative   Ketones, UA Negative Negative   RBC, UA Trace (A) Negative   Bilirubin, UA Negative Negative   Urobilinogen, Ur 0.2 0.2 - 1.0 mg/dL   Nitrite, UA Positive (A) Negative   Microscopic Examination See below:    Cath Change/ Replacement  Patient is present today for a catheter change due to urinary retention.  8ml of water was removed from the balloon, a 14FR foley cath was removed without difficulty.  Patient was cleaned and prepped in a sterile fashion with betadine and 2% lidocaine jelly was instilled into the urethra. A 16 FR coude foley cath was replaced into the bladder, no complications were noted. Urine return was noted 2000+ml and urine was yellow in color. The balloon was filled with 10ml of sterile water. A leg bag was attached for drainage.  A night bag was also given to the patient and patient was given instruction on how to change from one bag to another. Patient was given proper instruction on catheter care.    Performed by: Carman Ching, PA-C   Assessment & Plan:   1. Malfunction of Foley catheter, initial encounter (HCC) (Primary) He was hypotensive but afebrile, HR WNL on arrival. I unkinked his Foley catheter  and it immediately started to drain. Patient became pale and had a near syncopal episode with this; wife clarifies that this is not atypical for him.   I exchanged his Foley catheter in clinic as above and obtained a urine sample for UA/cx. He tolerated well. Presyncopal episode and pallor resolved and he reported feeling much better with bladder drainage. His hypotension had resolved by the conclusion of our visit.  I elected not to refer him back to the ED since his vitals and clinical presentation normalized with Foley exchange/bladder drainage. I'm starting empiric Omnicef, dose adjusted for renal function, out of an abundance of caution. We discussed strict return precautions including fever,  malaise, or loss of consciousness. They are in agreement with this plan. - BLADDER SCAN AMB NON-IMAGING - CULTURE, URINE COMPREHENSIVE - Urinalysis, Complete - cefdinir (OMNICEF) 300 MG capsule; Take 1 capsule (300 mg total) by mouth daily for 7 days.  Dispense: 7 capsule; Refill: 0  Return in about 3 days (around 08/15/2023) for Keep follow-up as scheduled.  Carman Ching, PA-C  [facility] [place] [address] [place], [place] [address] [phone]

## 2023-08-12 NOTE — Telephone Encounter (Signed)
 Patient wife called concerned that patient has no urine in his catheter bag this morning.  She said the last they noticed urine was last night.  Consulted with PA and appointment was scheduled.

## 2023-08-13 ENCOUNTER — Encounter: Payer: Self-pay | Admitting: Emergency Medicine

## 2023-08-13 ENCOUNTER — Emergency Department
Admission: EM | Admit: 2023-08-13 | Discharge: 2023-08-13 | Disposition: A | Discharge status: Home / Self Care | Attending: Emergency Medicine | Admitting: Emergency Medicine

## 2023-08-13 ENCOUNTER — Other Ambulatory Visit: Payer: Self-pay

## 2023-08-13 DIAGNOSIS — I10 Essential (primary) hypertension: Secondary | ICD-10-CM | POA: Diagnosis not present

## 2023-08-13 DIAGNOSIS — D72829 Elevated white blood cell count, unspecified: Secondary | ICD-10-CM | POA: Diagnosis not present

## 2023-08-13 DIAGNOSIS — R310 Gross hematuria: Secondary | ICD-10-CM | POA: Insufficient documentation

## 2023-08-13 DIAGNOSIS — R001 Bradycardia, unspecified: Secondary | ICD-10-CM | POA: Diagnosis not present

## 2023-08-13 DIAGNOSIS — R319 Hematuria, unspecified: Secondary | ICD-10-CM | POA: Diagnosis not present

## 2023-08-13 LAB — CBC
HCT: 30.6 % — ABNORMAL LOW (ref 39.0–52.0)
Hemoglobin: 10.4 g/dL — ABNORMAL LOW (ref 13.0–17.0)
MCH: 33 pg (ref 26.0–34.0)
MCHC: 34 g/dL (ref 30.0–36.0)
MCV: 97.1 fL (ref 80.0–100.0)
Platelets: 321 10*3/uL (ref 150–400)
RBC: 3.15 MIL/uL — ABNORMAL LOW (ref 4.22–5.81)
RDW: 12.4 % (ref 11.5–15.5)
WBC: 15.4 10*3/uL — ABNORMAL HIGH (ref 4.0–10.5)
nRBC: 0 % (ref 0.0–0.2)

## 2023-08-13 LAB — URINALYSIS, ROUTINE W REFLEX MICROSCOPIC
RBC / HPF: >50 RBC/hpf (ref 0–5)
Squamous Epithelial / HPF: 0 /HPF (ref 0–5)
WBC, UA: >50 WBC/hpf (ref 0–5)

## 2023-08-13 LAB — BASIC METABOLIC PANEL
Anion gap: 10 (ref 5–15)
BUN: 52 mg/dL — ABNORMAL HIGH (ref 8–23)
CO2: 23 mmol/L (ref 22–32)
Calcium: 8.6 mg/dL — ABNORMAL LOW (ref 8.9–10.3)
Chloride: 103 mmol/L (ref 98–111)
Creatinine, Ser: 2.16 mg/dL — ABNORMAL HIGH (ref 0.61–1.24)
GFR, Estimated: 30 mL/min — ABNORMAL LOW (ref >60)
Glucose, Bld: 125 mg/dL — ABNORMAL HIGH (ref 70–99)
Potassium: 3.6 mmol/L (ref 3.5–5.1)
Sodium: 136 mmol/L (ref 135–145)

## 2023-08-13 LAB — SAMPLE TO BLOOD BANK

## 2023-08-13 MED ORDER — SODIUM CHLORIDE 0.9 % IV SOLN
1.0000 g | Freq: Once | INTRAVENOUS | Status: AC
  Administered 2023-08-13: 1 g via INTRAVENOUS
  Filled 2023-08-13: qty 10

## 2023-08-13 MED ORDER — LACTATED RINGERS IV BOLUS
1000.0000 mL | Freq: Once | INTRAVENOUS | Status: AC
  Administered 2023-08-13: 1000 mL via INTRAVENOUS

## 2023-08-13 MED ORDER — SODIUM CHLORIDE 0.9 % IR SOLN
3000.0000 mL | Status: DC
Start: 2023-08-13 — End: 2023-08-14
  Administered 2023-08-13: 3000 mL

## 2023-08-13 NOTE — ED Triage Notes (Signed)
 FIRST RN: pt from home began having blood in his urine last night, pt has foley.

## 2023-08-13 NOTE — ED Triage Notes (Signed)
 Patient to ED via POV for blood in urine. States he was admitted for same last week. Seen at urologist yesterday and had catheter change. Blood in urine started today. NAD noted.

## 2023-08-13 NOTE — ED Provider Notes (Signed)
 [facility] Provider Note    Event Date/Time   First MD Initiated Contact with Patient 08/13/23 1503     (approximate)   History   Chief Complaint Hematuria   HPI  Zachary [name] is a 83 y.o. male with past medical history of hypertension, hyperlipidemia, and CAD who presents to the ED complaining of hematuria.  Patient reports that he noticed dark red blood in his Foley collecting black after waking up this morning.  He initially had a catheter placed about 1 week ago following admission for urinary retention and renal failure, but states he has had intermittent bleeding even prior to that.  He was seen at urology yesterday due to obstruction of his catheter, had a catheter replaced at that time with improvement in flow but had minimal bleeding.  He was started on Omnicef at that time and urine was sent for culture.  Today he denies any fevers, flank pain, abdominal pain, nausea, or vomiting.  He does not take any blood thinners.      Physical Exam   Triage Vital Signs: ED Triage Vitals  Encounter Vitals Group     BP 08/13/23 1224 (!) 88/64     Systolic BP Percentile --      Diastolic BP Percentile --      Pulse Rate 08/13/23 1224 78     Resp 08/13/23 1224 18     Temp 08/13/23 1224 97.8 F (36.6 C)     Temp Source 08/13/23 1224 Oral     SpO2 08/13/23 1224 97 %     Weight 08/13/23 1222 168 lb (76.2 kg)     Height 08/13/23 1222 6' 2.5" (1.892 m)     Head Circumference --      Peak Flow --      Pain Score 08/13/23 1222 0     Pain Loc --      Pain Education --      Exclude from Growth Chart --     Most recent vital signs: Vitals:   08/13/23 1515 08/13/23 1700  BP: 106/63 114/61  Pulse: 75 67  Resp: (!) 22 17  Temp: 97.7 F (36.5 C)   SpO2: 100% 100%    Constitutional: Alert and oriented. Eyes: Conjunctivae are normal. Head: Atraumatic. Nose: No congestion/rhinnorhea. Mouth/Throat: Mucous membranes are moist.  Cardiovascular:  Normal rate, regular rhythm. Grossly normal heart sounds.  2+ radial pulses bilaterally. Respiratory: Normal respiratory effort.  No retractions. Lungs CTAB. Gastrointestinal: Soft and nontender.  No CVA tenderness bilaterally.  No distention. Genitourinary: Foley catheter in place draining dark red and cloudy urine. Musculoskeletal: No lower extremity tenderness nor edema.  Neurologic:  Normal speech and language. No gross focal neurologic deficits are appreciated.    ED Results / Procedures / Treatments   Labs (all labs ordered are listed, but only abnormal results are displayed) Labs Reviewed  URINALYSIS, ROUTINE W REFLEX MICROSCOPIC - Abnormal; Notable for the following components:      Result Value   Color, Urine RED (*)    APPearance TURBID (*)    Glucose, UA   (*)    Value: TEST NOT REPORTED DUE TO COLOR INTERFERENCE OF URINE PIGMENT   Hgb urine dipstick   (*)    Value: TEST NOT REPORTED DUE TO COLOR INTERFERENCE OF URINE PIGMENT   Bilirubin Urine   (*)    Value: TEST NOT REPORTED DUE TO COLOR INTERFERENCE OF URINE PIGMENT   Ketones, ur   (*)  Value: TEST NOT REPORTED DUE TO COLOR INTERFERENCE OF URINE PIGMENT   Protein, ur   (*)    Value: TEST NOT REPORTED DUE TO COLOR INTERFERENCE OF URINE PIGMENT   Nitrite   (*)    Value: TEST NOT REPORTED DUE TO COLOR INTERFERENCE OF URINE PIGMENT   Leukocytes,Ua   (*)    Value: TEST NOT REPORTED DUE TO COLOR INTERFERENCE OF URINE PIGMENT   Bacteria, UA MANY (*)    All other components within normal limits  BASIC METABOLIC PANEL - Abnormal; Notable for the following components:   Glucose, Bld 125 (*)    BUN 52 (*)    Creatinine, Ser 2.16 (*)    Calcium 8.6 (*)    GFR, Estimated 30 (*)    All other components within normal limits  CBC - Abnormal; Notable for the following components:   WBC 15.4 (*)    RBC 3.15 (*)    Hemoglobin 10.4 (*)    HCT 30.6 (*)    All other components within normal limits  URINE CULTURE  SAMPLE TO  BLOOD BANK    PROCEDURES:  Critical Care performed: No  Procedures   MEDICATIONS ORDERED IN ED: Medications  sodium chloride irrigation 0.9 % 3,000 mL (0 mLs Irrigation Stopped 08/13/23 1802)  lactated ringers bolus 1,000 mL (0 mLs Intravenous Stopped 08/13/23 1802)  cefTRIAXone (ROCEPHIN) 1 g in sodium chloride 0.9 % 100 mL IVPB (0 g Intravenous Stopped 08/13/23 1658)     IMPRESSION / MDM / ASSESSMENT AND PLAN / ED COURSE  I reviewed the triage vital signs and the nursing notes.                              83 y.o. male with past medical history of hypertension, hyperlipidemia, and CAD who presents to the ED complaining of hematuria since waking up this morning following catheter replacement at urology yesterday.  Patient's presentation is most consistent with acute presentation with potential threat to life or bodily function.  Differential diagnosis includes, but is not limited to, cystitis, pyelonephritis, catheter obstruction, AKI, anemia, electrolyte abnormality.  Patient nontoxic-appearing and in no acute distress, vital signs initially remarkable for borderline hypotension, but this has improved without intervention.  Remainder of vital signs are reassuring, hemoglobin improved compared to previous, renal function also seems to be improving without acute electrolyte abnormality.  He does have increasing leukocytosis, was started on antibiotics yesterday but culture has not yet resulted.  We will send for repeat culture today and give dose of IV Rocephin, hydrate with IV fluids.  Plan to place three-way Foley catheter for CBI and discuss with urology.  Hematuria cleared with CBI, patient remains well-appearing with no ongoing complaints on reassessment.  Given hematuria has resolved and hemoglobin is stable, patient appropriate for discharge home with outpatient urology follow-up in 2 days.  He was counseled to continue antibiotics and to return to the ED for new or worsening  symptoms.  Patient agrees with plan.      FINAL CLINICAL IMPRESSION(S) / ED DIAGNOSES   Final diagnoses:  Gross hematuria     Rx / DC Orders   ED Discharge Orders     None        Note:  This document was prepared using Dragon voice recognition software and may include unintentional dictation errors.   Chesley Noon, MD 08/13/23 [phone]

## 2023-08-13 NOTE — ED Notes (Signed)
 Type and screen sent to blood bank at this time.

## 2023-08-15 ENCOUNTER — Ambulatory Visit (INDEPENDENT_AMBULATORY_CARE_PROVIDER_SITE_OTHER): Admitting: Urology

## 2023-08-15 VITALS — BP 134/78 | HR 80 | Ht 74.5 in | Wt 165.0 lb

## 2023-08-15 DIAGNOSIS — N390 Urinary tract infection, site not specified: Secondary | ICD-10-CM

## 2023-08-15 DIAGNOSIS — N319 Neuromuscular dysfunction of bladder, unspecified: Secondary | ICD-10-CM

## 2023-08-15 DIAGNOSIS — R339 Retention of urine, unspecified: Secondary | ICD-10-CM

## 2023-08-15 LAB — CULTURE, URINE COMPREHENSIVE

## 2023-08-15 NOTE — Progress Notes (Signed)
 [name],acting as a scribe for [name], MD.,have documented all relevant documentation on the behalf of [name], MD,as directed by  [name], MD while in the presence of [name], MD.  08/15/23 2:08 PM   Zachary [name] [birth year] [id number]  Referring provider: Bosie Clos, MD [address] [place],  [place] [address]  Chief Complaint  Patient presents with   Follow-up   Urinary Retention    HPI: 83 year-old male who presents today for follow-up of acute on chronic renal failure in the setting of acute on chronic urinary retention.   He had a CT abdomen pelvis without contrast on 08-05-2023 that showed marked bladder distension up to the level of the xyphoid, severe bilateral hydroureteronephrosis, and fornaceal rupture. His creatinine at the time was 10, with a previous baseline around 1.5. Notably, his marked distension was seen as long as two years ago.   He was seen more recently in the office after a catheter issue, and a urine culture was sent. He was started on presumptive Omnicef for a possible UTI, which is growing E. coli, but sensitivities are still pending. He returned to the ER with gross hematuria, which was managed, and he was discharged home.   He presents today to discuss concerns and definitive management of his chronic massive urinary retention. He reports that his urine flow has been inconsistent for about a month, with a steady flow followed by a break and then a steady flow again. He denies any previous issues with urination until about ten days ago when he noticed blood in his urine. He denies any neurological conditions such as stroke, Parkinson's, or diabetes.    PMH: Past Medical History:  Diagnosis Date   B12 deficiency anemia 08/03/2019   Hypercholesterolemia    Hypertension    Vertigo    last episode approx 09/2018    Surgical History: Past Surgical History:  Procedure Laterality Date   APPENDECTOMY      CATARACT EXTRACTION Bilateral    COLONOSCOPY WITH PROPOFOL N/A 12/24/2018   Procedure: COLONOSCOPY WITH PROPOFOL;  Surgeon: Midge Minium, MD;  Location: [facility] ENDOSCOPY;  Service: Endoscopy;  Laterality: N/A;   COLONOSCOPY WITH PROPOFOL N/A 12/13/2019   Procedure: COLONOSCOPY WITH BIOPSY ;  Surgeon: Midge Minium, MD;  Location: [facility];  Service: Endoscopy;  Laterality: N/A;  priority 3   PLEURAL SCARIFICATION  05/2015   POLYPECTOMY N/A 12/13/2019   Procedure: POLYPECTOMY;  Surgeon: Midge Minium, MD;  Location: [facility];  Service: Endoscopy;  Laterality: N/A;    Home Medications:  Allergies as of 08/15/2023   No Known Allergies      Medication List        Accurate as of August 15, 2023  2:08 PM. If you have any questions, ask your nurse or doctor.          PAUSE taking these medications    aspirin EC 81 MG tablet Wait to take this until your doctor or other care provider tells you to start again. Take 81 mg by mouth at bedtime.       TAKE these medications    acetaminophen 325 MG tablet Commonly known as: TYLENOL Take 2 tablets (650 mg total) by mouth every 6 (six) hours as needed for mild pain (pain score 1-3), headache or fever (or Fever >/= 101).   atorvastatin 10 MG tablet Commonly known as: LIPITOR Take 10 mg by mouth daily. What changed: Another medication with the  same name was removed. Continue taking this medication, and follow the directions you see here.   calcium carbonate 1250 (500 Ca) MG tablet Commonly known as: OS-CAL - dosed in mg of elemental calcium Take 1 tablet (1,250 mg total) by mouth 2 (two) times daily with a meal.   cefdinir 300 MG capsule Commonly known as: OMNICEF Take 1 capsule (300 mg total) by mouth daily for 7 days.   multivitamin with minerals Tabs tablet Take 1 tablet by mouth daily.        Family History: Family History  Problem Relation Age of Onset   Hypertension Mother    Hyperlipidemia Mother     Heart attack Father    Hypertension Father    CVA Father    ALS Brother    Prostate cancer Brother     Social History:  reports that he has never smoked. He has never used smokeless tobacco. He reports that he does not drink alcohol and does not use drugs.   Physical Exam: BP 134/78   Pulse 80   Ht 6' 2.5" (1.892 m)   Wt 165 lb (74.8 kg)   BMI 20.90 kg/m   Constitutional:  Alert and oriented, No acute distress. HEENT: [place] AT, moist mucus membranes.  Trachea midline, no masses. Neurologic: Grossly intact, no focal deficits, moving all 4 extremities. Psychiatric: Normal mood and affect.   Pertinent Imaging: EXAM: RENAL / URINARY TRACT ULTRASOUND COMPLETE  COMPARISON:  None Available.  FINDINGS: Right Kidney:  Renal measurements: 10.7 cm x 5.6 cm x 4.3 cm = volume: 135.9 mL. There is diffuse renal cortical thinning. Diffusely increased echogenicity of the renal parenchyma is noted. A 2.5 cm x 2.6 cm x 2.3 cm cyst is seen within the upper pole of the right kidney. There is moderate severity hydronephrosis.  Left Kidney:  Renal measurements: 10.3 cm x 4.6 cm x 4.3 cm = volume: 106.4 mL. There is diffuse renal cortical thinning. Diffusely increased echogenicity of the renal parenchyma is noted. A 0.9 cm x 1.0 cm x 0.7 cm cyst is seen within the left kidney. There is moderate severity hydronephrosis.  Bladder:  A Foley catheter is in place.  Other:  None.  IMPRESSION: 1. Bilateral echogenic kidneys which may represent sequelae associated with medical renal disease. 2. Bilateral renal cysts. 3. Moderate severity bilateral hydronephrosis.   Electronically Signed By: Aram Candela M.D. On: 08/06/2023 00:30    Assessment & Plan:    1. Acute on Chronic Renal Failure - His renal failure is likely secondary to chronic urinary retention and bladder dysfunction.  - His creatinine was significantly elevated at 10, with a baseline of 1.5. The primary goal is  to prevent further renal damage by ensuring effective bladder drainage.  2. Chronic Urinary Retention - He has a history of significant bladder distension and hydronephrosis, suggesting bladder dysfunction.  - Options for management include maintaining an indwelling catheter or considering a suprapubic tube for long-term management.  - He is unable to self-catheterize due to tremors, and his wife is also unable to assist effectively.  3. Urinary Tract Infection - He is currently on Omnicef for a UTI with E. coli.  - Continue antibiotics as prescribed, and adjust based on culture sensitivities once available.  4. Bladder Dysfunction - Suspected bladder failure due to inability to feel bladder fullness and significant distension.  - Recommend urodynamic testing to assess bladder function and determine if surgical intervention on the prostate would be beneficial.  5. Plan  for Suprapubic Tube - Discussed the option of a suprapubic tube with him as a potentially better long-term solution to prevent infection and protect renal function.  - Provided written information about the procedure for him to review with his family.   Return for further discussion with his wife about the possibility of a SPT. Consider scheduling urodynamic testing to evaluate bladder function. Ensured that he understands the importance of preventing further renal damage and the potential risks associated with his current condition.  I have reviewed the above documentation for accuracy and completeness, and I agree with the above.   [name], MD   [facility] [address] [place], [place] [address] [phone]  I spent 33 total minutes on the day of the encounter including pre-visit review of the medical record, face-to-face time with the patient, and post visit ordering of labs/imaging/tests.

## 2023-08-15 NOTE — Patient Instructions (Signed)
   What Is A Supra-Pubic Catheter?   A Supra Pubic Catheter is a urinary catheter that is inserted into the bladder from a small cut in your tummy, just above your pubic bone. You will not need to pass urine yourself from your urethra.  The Supra Pubic Catheter is kept in place by a balloon that is filled with 10ml of sterile water to prevent it falling out. The catheter will drain urine from the bladder into a catheter bag. Alternatively, you may be offered a catheter valve that attaches to the end of your catheter but this will be discussed by your Nurse/Doctor because catheter valves are not suitable for all patients.  Why is a Supra Pubic catheter used?  A Supra Pubic Catheter is inserted if you:  Have a problem with your urethra. If you have a blockage called a stricture and a urethral catheter cannot be passed.  Have an enlarged prostate and a urethral catheter cannot be passed.  Have suffered pelvic or urethral trauma.  Require a catheter for long term use due to a congenital or progressive degenerative illness.  Require a long term catheter and suffer from difficult urethral catheter insertions or long term urinary tract infections.  A long term catheter has damaged the end of your penis and this will save further damage.  Poorly functioning bladder which either fails to store or to expel urine adequately.    How is a Supra Pubic Catheter inserted?  Normally a Supra Pubic Catheter is inserted via a hospital day case appointment (you will go home on the same day of admission).  Insertion of a supra pubic catheter normally takes approximately 30 minutes. This will be performed by a doctor working on your Consultant's Team.  The procedure to put this catheter in place should not be too uncomfortable.  If you already have a urethral catheter in place, your bladder will be artificially filled with sterile fluid before the Supra Pubic Catheter is inserted.  A small injection of local  anaesthetic will be given in your tummy to numb the area before a small cut is made.  The catheter is put into your bladder through the small cut, and it stays in place by a small balloon which is inflated once the catheter is in your bladder. The catheter is then attached to a catheter bag where the urine is then collected.  In some circumstances a stitch is used to close the wound in your tummy and secure the catheter temporarily. The stitch needs to be removed after 10 days and this can be arranged with your [facility].  A dressing will be placed around the catheter site until the skin around the catheter has healed.   Once you have had your Supra Pubic Catheter inserted this will stay in place for three months to allow the tract to settle and become patent before a catheter change is performed by your [facility] Nurse. There after, your supra pubic catheter is then changed approximately on three monthly basis by the [facility] Nurse.

## 2023-08-16 LAB — URINE CULTURE: Culture: >=100000 — AB

## 2023-08-17 NOTE — Progress Notes (Unsigned)
 08/18/2023 1:33 PM   [name] 12-Dec-1940 [id number]  Referring provider: Bosie Clos, MD [address] [place],  [place] [address]  Urological history: 1. High risk hematuria/urinary retention  -non smoker -non-contrast CT (08/2023) - severe distention improved, improved bilateral hydroureteronephrosis and bilateral renal cysts -RUS (08/2023) - bilateral echogenic kidneys, bilateral renal cysts and moderate severity bilateral hydronephrosis -secondary to over distended bladder   No chief complaint on file.  HPI: Zachary [name] is a 83 y.o. male who presents today for discussion for SPT placement with his wife, Truddie Coco, and his brother, Dayton Scrape.    Previous records reviewed.   He has not had any issues with his catheter since he was last seen in the office.  He does have a little bit of difficulty emptying the leg bag, but he can mostly manage it.  His urine culture did result with ESBL E. coli and we will prescribe a dose of fosfomycin.    PMH: Past Medical History:  Diagnosis Date   B12 deficiency anemia 08/03/2019   Hypercholesterolemia    Hypertension    Vertigo    last episode approx 09/2018    Surgical History: Past Surgical History:  Procedure Laterality Date   APPENDECTOMY     CATARACT EXTRACTION Bilateral    COLONOSCOPY WITH PROPOFOL N/A 12/24/2018   Procedure: COLONOSCOPY WITH PROPOFOL;  Surgeon: Midge Minium, MD;  Location[facility] ENDOSCOPY;  Service: Endoscopy;  Laterality: N/A;   COLONOSCOPY WITH PROPOFOL N/A 12/13/2019   Procedure: COLONOSCOPY WITH BIOPSY ;  Surgeon: Midge Minium, MD;  Location: [facility];  Service: Endoscopy;  Laterality: N/A;  priority 3   PLEURAL SCARIFICATION  05/2015   POLYPECTOMY N/A 12/13/2019   Procedure: POLYPECTOMY;  Surgeon: Midge Minium, MD;  Location: [facility];  Service: Endoscopy;  Laterality: N/A;    Home Medications:  Allergies as of 08/18/2023   No Known Allergies       Medication List        Accurate as of August 18, 2023  1:33 PM. If you have any questions, ask your nurse or doctor.          PAUSE taking these medications    aspirin EC 81 MG tablet Wait to take this until your doctor or other care provider tells you to start again. Take 81 mg by mouth at bedtime.       TAKE these medications    acetaminophen 325 MG tablet Commonly known as: TYLENOL Take 2 tablets (650 mg total) by mouth every 6 (six) hours as needed for mild pain (pain score 1-3), headache or fever (or Fever >/= 101).   atorvastatin 10 MG tablet Commonly known as: LIPITOR Take 10 mg by mouth daily.   calcium carbonate 1250 (500 Ca) MG tablet Commonly known as: OS-CAL - dosed in mg of elemental calcium Take 1 tablet (1,250 mg total) by mouth 2 (two) times daily with a meal.   cefdinir 300 MG capsule Commonly known as: OMNICEF Take 1 capsule (300 mg total) by mouth daily for 7 days.   fosfomycin 3 g Pack Commonly known as: MONUROL Take 3 g by mouth once for 1 dose.   multivitamin with minerals Tabs tablet Take 1 tablet by mouth daily.        Allergies: No Known Allergies  Family History: Family History  Problem Relation Age of Onset   Hypertension Mother    Hyperlipidemia Mother    Heart attack Father  Hypertension Father    CVA Father    ALS Brother    Prostate cancer Brother     Social History:  reports that he has never smoked. He has never used smokeless tobacco. He reports that he does not drink alcohol and does not use drugs.  ROS: Pertinent ROS in HPI  Physical Exam: BP 96/64   Pulse 76   Ht 6' 2.5" (1.892 m)   Wt 165 lb (74.8 kg)   BMI 20.90 kg/m   Constitutional:  Well nourished. Alert and oriented, No acute distress. HEENT: [place] AT, moist mucus membranes.  Trachea midline Cardiovascular: No clubbing, cyanosis, or edema. Respiratory: Normal respiratory effort, no increased work of breathing. Neurologic: Grossly intact, no focal  deficits, moving all 4 extremities. Psychiatric: Normal mood and affect.  Laboratory Data: Lab Results  Component Value Date   WBC 15.4 (H) 08/13/2023   HGB 10.4 (L) 08/13/2023   HCT 30.6 (L) 08/13/2023   MCV 97.1 08/13/2023   PLT 321 08/13/2023   Lab Results  Component Value Date   CREATININE 2.16 (H) 08/13/2023   Lab Results  Component Value Date   AST 23 08/05/2023   Lab Results  Component Value Date   ALT 21 08/05/2023   Urinalysis  See EPIC and HPI  I have reviewed the labs.   Pertinent Imaging: NA  Assessment & Plan:    1. Urinary retention -currently managed with an indwelling Foley -We discussed how an SPT is placed under CT guidance by interventional radiologist and that we try to get the 16 French size catheter and at the first attempt, but sometimes it does require to return for serial dilation.  Once a regular 16 [place] Foley is able to place in the SPT track, we can have monthly exchanges here in the office.  I explained the benefits of an SPT, such as less discomfort for Foley catheter exchange, once incidence of recurrent UTI and no risk of penile erosion.  I did explain that SPT still drains urine dependently from the bladder and that we can still have infection, blood in the urine and clogging of the catheter. -They are all in agreement and would like to be referred to interventional radiology for consideration for SPT placement  2. ESBL E.coli -He does not currently have symptoms of UTI, but since he is undergoing instrumentation in the future I did send a dose of fosfomycin into his pharmacy and he will use a single care coupon to cover the cost  Return for refer to IR for SPT placement .  These notes generated with voice recognition software. I apologize for typographical errors.  Cloretta Ned  [facility] [address]  [address] [place], [place] [address] [phone]  I spent 30  minutes on the day of the  encounter to include pre-visit record review, face-to-face time with the patient, and post-visit ordering of tests.

## 2023-08-18 ENCOUNTER — Ambulatory Visit: Admitting: Urology

## 2023-08-18 ENCOUNTER — Other Ambulatory Visit: Payer: Self-pay | Admitting: Urology

## 2023-08-18 ENCOUNTER — Encounter: Payer: Self-pay | Admitting: Urology

## 2023-08-18 VITALS — BP 96/64 | HR 76 | Ht 74.5 in | Wt 165.0 lb

## 2023-08-18 DIAGNOSIS — R339 Retention of urine, unspecified: Secondary | ICD-10-CM | POA: Diagnosis not present

## 2023-08-18 DIAGNOSIS — Z22358 Carrier of other enterobacterales: Secondary | ICD-10-CM

## 2023-08-18 MED ORDER — FOSFOMYCIN TROMETHAMINE 3 G PO PACK: 3.0000 g | PACK | Freq: Once | ORAL | 0 refills | Status: AC

## 2023-08-18 NOTE — Progress Notes (Addendum)
 ED Antimicrobial Stewardship Positive Culture Follow Up   Zachary [name] is an 83 y.o. male who presented to [facility] on 08/13/2023 with a chief complaint of dark red blood in their foley.   Chief Complaint  Patient presents with   Hematuria   Recent Results (from the past 720 hours)  Urine Culture     Status: None   Collection Time: 07/25/23 12:06 PM   Specimen: Urine, Clean Catch  Result Value Ref Range Status   Specimen Description   Final    URINE, CLEAN CATCH Performed at [facility], [address], [place], [place] [address]    Special Requests   Final    Normal Performed at [facility], [address], [place], [place] [address]    Culture   Final    NO GROWTH Performed at [facility], [address] [place], [place] [address]    Report Status 07/26/2023 FINAL  Final  Urine Culture (for pregnant, neutropenic or urologic patients or patients with an indwelling urinary catheter)     Status: None   Collection Time: 08/05/23  9:26 PM   Specimen: Urine, Random  Result Value Ref Range Status   Specimen Description   Final    URINE, RANDOM Performed at [facility], [address], [place], [place] [address]    Special Requests   Final    NONE Performed at [facility], [address] [place], [place] [address]    Culture   Final    NO GROWTH Performed at [facility], [address], [place], [place] [address]    Report Status 08/07/2023 FINAL  Final  Culture, blood (Routine X 2) w Reflex to ID Panel     Status: None   Collection Time: 08/05/23 10:39 PM   Specimen: BLOOD  Result Value Ref Range Status   Specimen Description BLOOD RIGHT FOREARM  Final   Special Requests   Final    BOTTLES DRAWN AEROBIC AND ANAEROBIC Blood Culture results may not be optimal due to an inadequate volume of blood received in culture bottles   Culture   Final    NO GROWTH 5 DAYS Performed at [facility],  [address] [place], [place] [address]    Report Status 08/10/2023 FINAL  Final  Culture, blood (Routine X 2) w Reflex to ID Panel     Status: None   Collection Time: 08/05/23 11:55 PM   Specimen: BLOOD  Result Value Ref Range Status   Specimen Description BLOOD RIGHT ARM  Final   Special Requests   Final    BOTTLES DRAWN AEROBIC AND ANAEROBIC Blood Culture results may not be optimal due to an inadequate volume of blood received in culture bottles   Culture   Final    NO GROWTH 5 DAYS Performed at [facility], [address] [place], [place] [address]    Report Status 08/11/2023 FINAL  Final  CULTURE, URINE COMPREHENSIVE     Status: None   Collection Time: 08/12/23 11:41 AM   Specimen: Urine   CU  Result Value Ref Range Status   Urine Culture, Comprehensive CANCELED      Comment: Test not performed. Patient was unable to provide a self-collected specimen for the requested testing. The following test(s) were not performed:  Result canceled by the ancillary.   Microscopic Examination     Status: Abnormal   Collection Time: 08/12/23 11:41 AM  Urine  Result Value Ref Range Status   WBC, UA 6-10 (A) 0 - 5 /hpf Final   RBC, Urine 3-10 (A) 0 - 2 /hpf Final   Epithelial Cells (non renal) 0-10 0 - 10 /hpf Final   Bacteria, UA Many (A) None seen/Few Final  Urine Culture     Status: Abnormal   Collection Time: 08/13/23 12:26 PM   Specimen: Urine, Catheterized  Result Value Ref Range Status   Specimen Description   Final    URINE, CATHETERIZED Performed at [facility], [address] [place], [place] [address]    Special Requests   Final    NONE Performed at [facility], [address] [place], [place] [address]    Culture (A)  Final    >=100,000 COLONIES/mL ESCHERICHIA COLI Confirmed Extended Spectrum Beta-Lactamase Producer (ESBL).  In bloodstream infections from ESBL organisms, carbapenems are preferred over piperacillin/tazobactam.  They are shown to have a lower risk of mortality.    Report Status 08/16/2023 FINAL  Final   Organism ID, Bacteria ESCHERICHIA COLI (A)  Final      Susceptibility   Escherichia coli - MIC*    AMPICILLIN >=32 RESISTANT Resistant     CEFAZOLIN >=64 RESISTANT Resistant     CEFEPIME 16 RESISTANT Resistant     CEFTRIAXONE >=64 RESISTANT Resistant     CIPROFLOXACIN >=4 RESISTANT Resistant     GENTAMICIN <=1 SENSITIVE Sensitive     IMIPENEM <=0.25 SENSITIVE Sensitive     NITROFURANTOIN <=16 SENSITIVE Sensitive     TRIMETH/SULFA >=320 RESISTANT Resistant     AMPICILLIN/SULBACTAM 8 SENSITIVE Sensitive     PIP/TAZO <=4 SENSITIVE Sensitive ug/mL    * >=100,000 COLONIES/mL ESCHERICHIA COLI    [x]  Treated with Omnicef, organism resistant to prescribed antimicrobial []  Patient discharged originally without antimicrobial agent and treatment is now indicated  Plan:  Reached out to patient's urologist since they have a follow-up appointment on 3/17 for follow-up Called patient 3/16 and they reported feeling great without any fevers, chills, or concern for active infection at this time Advised patient if they started feeling poorly before seeing their provider the next day to go to the emergency room for treatment   Provider: [name], MD; Michiel Cowboy, PA  Effie Shy, PharmD Pharmacy Resident  08/18/2023 12:03 PM

## 2023-08-20 ENCOUNTER — Telehealth: Payer: Self-pay

## 2023-08-20 NOTE — Telephone Encounter (Signed)
 Patient scheduled for IR visit on Tuesday March 25th, 2025 10am appt for SPT Placement. 9am arrival. NPO aftermidnight, and must have a driver for visit. Patient to be holding ASA starting Thursday 3/20. Advised that IR coordinator will call and go over information on Monday. Advised to arrive at the Heart and Vascular Entrance.

## 2023-08-25 ENCOUNTER — Other Ambulatory Visit: Payer: Self-pay

## 2023-08-25 DIAGNOSIS — H8101 Meniere's disease, right ear: Secondary | ICD-10-CM | POA: Diagnosis not present

## 2023-08-25 DIAGNOSIS — Z01818 Encounter for other preprocedural examination: Secondary | ICD-10-CM

## 2023-08-25 NOTE — H&P (Incomplete)
 Chief Complaint: Chronic renal failure related to chronic urinary retention; request for image guided suprapubic catheter placement  Referring Provider(s): McGowan,Shannon A   Supervising Physician: {Supervising Physician:[id number]}  Patient Status: [facility] - Out-pt  History of Present Illness: Zachary [name] is a 83 y.o. male with a PMH significant for HTN, hypercholesterolemia, vertigo, pernicious anemia, acute on chronic renal failure in the setting of chronic urinary retention. A recent CT on 08/05/23 demonstrated significant bladder distention  and bilateral hydronephrosis. At that time creatinine was also increased to 10 from a baseline of 1.5, and the patient was admitted. He followed up with nephrology on 3/11 after requesting a office visit for catheter issues (no longer draining). The foley was exchanged and antibiotics empirically ordered while pending culture.   Patient then experienced hematuria on 3/12 and presented to the ED. Hematuria cleared with CBI in ED, pt discharged with urology f/u in 2 days. At that follow up visit, the long term need for a catheter was discussed leading to decision to consult IR for suprapubic catheter to prevent infection and protect renal function. He is not a candidate for self cath d/t tremors, wife unable to assist effectively.    Patient is Full Code  Past Medical History:  Diagnosis Date   B12 deficiency anemia 08/03/2019   Hypercholesterolemia    Hypertension    Vertigo    last episode approx 09/2018    Past Surgical History:  Procedure Laterality Date   APPENDECTOMY     CATARACT EXTRACTION Bilateral    COLONOSCOPY WITH PROPOFOL N/A 12/24/2018   Procedure: COLONOSCOPY WITH PROPOFOL;  Surgeon: Midge Minium, MD;  Location: [facility] ENDOSCOPY;  Service: Endoscopy;  Laterality: N/A;   COLONOSCOPY WITH PROPOFOL N/A 12/13/2019   Procedure: COLONOSCOPY WITH BIOPSY ;  Surgeon: Midge Minium, MD;  Location: [facility];  Service: Endoscopy;   Laterality: N/A;  priority 3   PLEURAL SCARIFICATION  05/2015   POLYPECTOMY N/A 12/13/2019   Procedure: POLYPECTOMY;  Surgeon: Midge Minium, MD;  Location: [facility];  Service: Endoscopy;  Laterality: N/A;    Allergies: Patient has no known allergies.  Medications: Prior to Admission medications   Medication Sig Start Date End Date Taking? Authorizing Provider  acetaminophen (TYLENOL) 325 MG tablet Take 2 tablets (650 mg total) by mouth every 6 (six) hours as needed for mild pain (pain score 1-3), headache or fever (or Fever >/= 101). 08/08/23   Sunnie Nielsen, DO  aspirin EC 81 MG tablet Take 81 mg by mouth at bedtime.    [provider]  atorvastatin (LIPITOR) 10 MG tablet Take 10 mg by mouth daily. 08/13/23   [provider]  calcium carbonate (OS-CAL - DOSED IN MG OF ELEMENTAL CALCIUM) 1250 (500 Ca) MG tablet Take 1 tablet (1,250 mg total) by mouth 2 (two) times daily with a meal. 08/08/23   Sunnie Nielsen, DO  Multiple Vitamin (MULTIVITAMIN WITH MINERALS) TABS tablet Take 1 tablet by mouth daily. 08/09/23   Sunnie Nielsen, DO     Family History  Problem Relation Age of Onset   Hypertension Mother    Hyperlipidemia Mother    Heart attack Father    Hypertension Father    CVA Father    ALS Brother    Prostate cancer Brother     Social History   Socioeconomic History   Marital status: Married    Spouse name: Not on file   Number of children: 0   Years of education: Not on file  Highest education level: 12th grade  Occupational History   Occupation: retired  Tobacco Use   Smoking status: Never   Smokeless tobacco: Never  Vaping Use   Vaping status: Never Used  Substance and Sexual Activity   Alcohol use: No    Alcohol/week: 0.0 standard drinks of alcohol   Drug use: No   Sexual activity: Not on file  Other Topics Concern   Not on file  Social History Narrative   Not on file   Social Drivers of Health   Financial Resource  Strain: Low Risk  (09/25/2021)   Overall Financial Resource Strain (CARDIA)    Difficulty of Paying Living Expenses: Not hard at all  Food Insecurity: No Food Insecurity (08/05/2023)   Hunger Vital Sign    Worried About Running Out of Food in the Last Year: Never true    Ran Out of Food in the Last Year: Never true  Transportation Needs: No Transportation Needs (08/05/2023)   PRAPARE - Administrator, Civil Service (Medical): No    Lack of Transportation (Non-Medical): No  Physical Activity: Insufficiently Active (09/25/2021)   Exercise Vital Sign    Days of Exercise per Week: 2 days    Minutes of Exercise per Session: 60 min  Stress: No Stress Concern Present (11/24/2019)   [name]    Feeling of Stress : Not at all  Social Connections: Moderately Isolated (08/05/2023)   Social Connection and Isolation Panel [NHANES]    Frequency of Communication with Friends and Family: Twice a week    Frequency of Social Gatherings with Friends and Family: Once a week    Attends Religious Services: Never    Database administrator or Organizations: No    Attends Engineer, structural: Never    Marital Status: Married     Review of Systems: A 12 point ROS discussed and pertinent positives are indicated in the HPI above.  All other systems are negative.  Review of Systems  Vital Signs: There were no vitals taken for this visit.  Advance Care Plan: No documents on file.    Physical Exam  Imaging: CT ABDOMEN PELVIS WO CONTRAST Result Date: 08/08/2023 CLINICAL DATA:  Peritonitis or perforation suspected. Abdominal pain. EXAM: CT ABDOMEN AND PELVIS WITHOUT CONTRAST TECHNIQUE: Multidetector CT imaging of the abdomen and pelvis was performed following the standard protocol without IV contrast. RADIATION DOSE REDUCTION: This exam was performed according to the departmental dose-optimization program which includes  automated exposure control, adjustment of the mA and/or kV according to patient size and/or use of iterative reconstruction technique. COMPARISON:  CT abdomen pelvis without contrast 08/05/2023, CT abdomen and pelvis with contrast 02/22/2022. FINDINGS: Lower chest: There are emphysematous and scarring changes in both lung bases. Small layering left pleural effusion again is noted, mild bronchial thickening in the lower lobes without infiltrates. There is a 9 x 8 mm right middle lobe nodule again noted new from 2023. Large hiatal hernia with intrathoracic stomach. The cardiac size is normal. There are coronary artery calcifications. Small pericardial effusion anteriorly. Hepatobiliary: No focal liver abnormality is seen without contrast. No calcified gallstones, gallbladder wall thickening, or biliary dilatation. Pancreas: No abnormality is seen without contrast. Spleen: No abnormality is seen without contrast. Adrenals/Urinary Tract: Stable 1.4 cm left adrenal myelolipoma. Stable 1.5 cm left adrenal genu nodule, above the typical density of an adenoma but stable in size and most likely a lipid poor adenoma. Normal right  adrenal gland. There is a 2.6 cm Bosniak 1 cyst in the upper pole of the right kidney, Hounsfield density is 13. In the left upper pole, there is a Bosniak 1 cyst measuring 1.5 cm, 17 Hounsfield units. There is bilateral cortical thinning and moderate but improved hydroureteronephrosis. There is a small volume of perinephric fluid underlying the left kidney inferiorly, previously greater. There was previously right perinephric fluid which has cleared. Perinephric stranding is seen greater on the left but has also improved. The bladder has been catheterized since the prior study which showed severe bladder dilatation. A large diverticulum of the posterior wall is again noted and partially decompressed. There is mild distention of the bladder today, reaching up to the level of L5-S1, but this has  markedly improved. There is wall thickening in the bladder eccentric anteriorly and to the right, unclear if this is muscular hypertrophy, inflammatory or infiltrating. Small amount of air in the anterior bladder. Cystoscopy recommended. Stomach/Bowel: No dilatation or wall thickening. Moderate fecal stasis. Large hiatal hernia as above. Advanced sigmoid diverticulosis without evidence of diverticulitis. An appendix is not seen. Vascular/Lymphatic: Aortic atherosclerosis. No enlarged abdominal or pelvic lymph nodes. Reproductive: Mild prostatomegaly with dystrophic calcifications. Other: Previously there was mesenteric congestion and body wall edema which have resolved. There is trace posterior deep pelvic ascites. There is no free hemorrhage, free air or incarcerated hernia. Musculoskeletal: Degenerative change and mild levoscoliosis lumbar spine, apex L1-2. Osteopenia. No acute or other significant osseous findings. IMPRESSION: 1. The bladder has been catheterized since the prior study which showed severe bladder dilatation. There is mild distention of the bladder today, reaching up to the level of L5-S1, but this has markedly improved. 2. There is wall thickening in the bladder eccentric anteriorly and to the right, unclear if this is muscular hypertrophy, inflammatory or infiltrating. Small amount of air in the anterior bladder. Cystoscopy recommended. 3. Moderate but improved bilateral hydroureteronephrosis. 4. Small volume of perinephric fluid underlying the left kidney inferiorly, previously greater. Prior right perinephric fluid has cleared. 5. Bilateral renal cysts. 6. Constipation and diverticulosis. 7. Large hiatal hernia with intrathoracic stomach. 8. Aortic and coronary artery atherosclerosis. 9. Small left pleural effusion, unchanged. 10. 9 x 8 mm right middle lobe nodule new from 2023. Per Fleischner Society Guidelines, recommend prompt non-contrast Chest CT for further evaluation. These guidelines do  not apply to immunocompromised patients and patients with cancer. Follow up in patients with significant comorbidities as clinically warranted. For lung cancer screening, adhere to Lung-RADS guidelines. Reference: Radiology. 2017; 284(1):228-43. 11. Osteopenia and degenerative change. Aortic Atherosclerosis (ICD10-I70.0) and Emphysema (ICD10-J43.9). Electronically Signed   By: Almira Bar M.D.   On: 08/08/2023 04:53   US Renal Result Date: 08/06/2023 CLINICAL DATA:  Acute renal failure. EXAM: RENAL / URINARY TRACT ULTRASOUND COMPLETE COMPARISON:  None Available. FINDINGS: Right Kidney: Renal measurements: 10.7 cm x 5.6 cm x 4.3 cm = volume: 135.9 mL. There is diffuse renal cortical thinning. Diffusely increased echogenicity of the renal parenchyma is noted. A 2.5 cm x 2.6 cm x 2.3 cm cyst is seen within the upper pole of the right kidney. There is moderate severity hydronephrosis. Left Kidney: Renal measurements: 10.3 cm x 4.6 cm x 4.3 cm = volume: 106.4 mL. There is diffuse renal cortical thinning. Diffusely increased echogenicity of the renal parenchyma is noted. A 0.9 cm x 1.0 cm x 0.7 cm cyst is seen within the left kidney. There is moderate severity hydronephrosis. Bladder: A Foley catheter is in place.  Other: None. IMPRESSION: 1. Bilateral echogenic kidneys which may represent sequelae associated with medical renal disease. 2. Bilateral renal cysts. 3. Moderate severity bilateral hydronephrosis. Electronically Signed   By: Aram Candela M.D.   On: 08/06/2023 00:30   CT ABDOMEN PELVIS WO CONTRAST Result Date: 08/05/2023 CLINICAL DATA:  Acute on chronic kidney disease and hematuria EXAM: CT ABDOMEN AND PELVIS WITHOUT CONTRAST TECHNIQUE: Multidetector CT imaging of the abdomen and pelvis was performed following the standard protocol without IV contrast. RADIATION DOSE REDUCTION: This exam was performed according to the departmental dose-optimization program which includes automated exposure control,  adjustment of the mA and/or kV according to patient size and/or use of iterative reconstruction technique. COMPARISON:  CT chest dated 03/04/2022, CT abdomen and pelvis dated 02/22/2022 FINDINGS: Lower chest: New 9 x 8 mm right middle lobe nodule (3:4). Small left pleural effusion with relaxation atelectasis of the left lower lobe. Partially imaged heart size is normal. Coronary artery calcifications. Hepatobiliary: No focal hepatic lesions. No intra or extrahepatic biliary ductal dilation. Normal gallbladder. Pancreas: No focal lesions or main ductal dilation. Spleen: Normal in size without focal abnormality. Adrenals/Urinary Tract: 10 mm left adrenal nodule (2:24) contains macroscopic fat, -53 HU, in keeping with myelolipoma. Additional 12 mm nodule measures 36 HU, not substantially changed in size from 02/22/2022, likely adenoma. No specific follow-up imaging recommended. No right adrenal nodule. No suspicious renal masses by noncontrast technique. Bilateral diffuse renal cortical thinning with severe hydroureteronephrosis to the level of the markedly distended urinary bladder. Asymmetric small to moderate volume left perinephric/retroperitoneal free fluid. Markedly distended urinary bladder with a large diverticulum arising from the posterior left. Stomach/Bowel: Large hiatal hernia contains nearly the entire stomach. Normal appearance of the stomach. No evidence of bowel wall thickening, distention, or inflammatory changes. Colonic diverticulosis without acute diverticulitis. Normal appendix. Vascular/Lymphatic: Aortic atherosclerosis. IVC is markedly effaced by the markedly distended urinary bladder and right ureter immediately above the iliac bifurcation. No enlarged abdominal or pelvic lymph nodes. Reproductive: Mildly enlarged prostate gland containing dystrophic calcifications. Other: Small volume perihepatic free fluid in addition to left retroperitoneal free fluid as above. No free air fluid collection.  Musculoskeletal: No acute or abnormal lytic or blastic osseous lesions. IMPRESSION: 1. Markedly distended urinary bladder with increased severe bilateral hydroureteronephrosis to the level of the markedly distended urinary bladder in keeping with bladder outlet obstruction. Asymmetric small to moderate volume left perinephric/retroperitoneal free fluid, which may be reactive or secondary to rupture of renal collecting system. 2. New 9 x 8 mm right middle lobe nodule. Consider one of the following in 3 months for both low-risk and high-risk individuals: (a) repeat chest CT, (b) follow-up PET-CT, or (c) tissue sampling. This recommendation follows the consensus statement: Guidelines for Management of Incidental Pulmonary Nodules Detected on CT Images: From the Fleischner Society 2017; Radiology 2017; 284:228-243. 3. Small left pleural effusion with relaxation atelectasis of the left lower lobe. 4. Large hiatal hernia contains nearly the entire stomach. 5. Aortic Atherosclerosis (ICD10-I70.0). Coronary artery calcifications. Assessment for potential risk factor modification, dietary therapy or pharmacologic therapy may be warranted, if clinically indicated. Critical Value/emergent results were called by telephone at the time of interpretation on 08/05/2023 at 1:48 pm to provider MUNSOOR LATEEF , who verbally acknowledged these results. The patient will be escorted to the emergency department. Electronically Signed   By: Agustin Cree M.D.   On: 08/05/2023 13:53    Labs:  CBC: Recent Labs    08/05/23 2239 08/06/23 0404 08/07/23 2440  08/13/23 1224  WBC 13.4* 11.0* 7.2 15.4*  HGB 10.5* 9.9* 9.4* 10.4*  HCT 30.1* 26.7* 25.4* 30.6*  PLT 439* 428* 421* 321    COAGS: Recent Labs    08/06/23 0404  INR 1.2  APTT 43*    BMP: Recent Labs    08/07/23 0115 08/07/23 0537 08/08/23 0504 08/13/23 1224  NA 135 139 138 136  K 3.8 3.4* 3.7 3.6  CL 103 105 103 103  CO2 22 22 23 23   GLUCOSE 122* 101* 104* 125*   BUN 105* 96* 74* 52*  CALCIUM 7.3* 7.5* 7.8* 8.6*  CREATININE 5.00* 4.44* 2.83* 2.16*  GFRNONAA 11* 13* 22* 30*    LIVER FUNCTION TESTS: Recent Labs    08/05/23 1859  BILITOT 1.0  AST 23  ALT 21  ALKPHOS 85  PROT 7.0  ALBUMIN 3.4*    TUMOR MARKERS: No results for input(s): "AFPTM", "CEA", "CA199", "CHROMGRNA" in the last 8760 hours.  Assessment and Plan: Chronic Renal Failure related to urinary retention; request for image guided suprapubic catheter placement - Existing foley in place, to be clamped morning of procedure - moderate sedation: NPO after MN, driver and supervision for 24 hr available - 3/25 CBC, BMP, PT/INR ordered, pending - Daily 81mg  ASA ?CONFIRM?  Risks and benefits of suprapubic catheter placement were discussed with the patient including bleeding, infection, damage to adjacent structures, bladder perforation/fistula connection, and sepsis.  All of the patient's questions were answered, patient is agreeable to proceed. Consent signed and in chart.   Thank you for allowing our service to participate in Zachary [name] 's care.    Electronically Signed: Carlton Adam, NP   08/25/2023, 3:11 PM     I spent a total of  30 Minutes   in face to face in clinical consultation, greater than 50% of which was counseling/coordinating care for image guided suprapubic catheter initial placement for chronic urinary retention.   (A copy of this note was sent to the referring provider and the time of visit.)

## 2023-08-26 ENCOUNTER — Encounter: Payer: Self-pay | Admitting: Radiology

## 2023-08-26 ENCOUNTER — Ambulatory Visit
Admission: RE | Admit: 2023-08-26 | Discharge: 2023-08-26 | Disposition: A | Discharge status: Home / Self Care | Source: Ambulatory Visit | Attending: Urology | Admitting: Urology

## 2023-08-26 ENCOUNTER — Other Ambulatory Visit: Payer: Self-pay

## 2023-08-26 VITALS — BP 103/68 | HR 92 | Temp 97.5°F | Resp 15 | Ht 74.5 in | Wt 142.0 lb

## 2023-08-26 DIAGNOSIS — I129 Hypertensive chronic kidney disease with stage 1 through stage 4 chronic kidney disease, or unspecified chronic kidney disease: Secondary | ICD-10-CM | POA: Diagnosis not present

## 2023-08-26 DIAGNOSIS — Z8249 Family history of ischemic heart disease and other diseases of the circulatory system: Secondary | ICD-10-CM | POA: Diagnosis not present

## 2023-08-26 DIAGNOSIS — N329 Bladder disorder, unspecified: Secondary | ICD-10-CM | POA: Diagnosis not present

## 2023-08-26 DIAGNOSIS — N189 Chronic kidney disease, unspecified: Secondary | ICD-10-CM | POA: Insufficient documentation

## 2023-08-26 DIAGNOSIS — R339 Retention of urine, unspecified: Secondary | ICD-10-CM

## 2023-08-26 DIAGNOSIS — Z01818 Encounter for other preprocedural examination: Secondary | ICD-10-CM

## 2023-08-26 HISTORY — PX: IR CYSTOSTOMY TUBE PLACEMENT/BLADDER ASPIRATION: IMG1097

## 2023-08-26 LAB — BASIC METABOLIC PANEL
Anion gap: 8 (ref 5–15)
BUN: 25 mg/dL — ABNORMAL HIGH (ref 8–23)
CO2: 21 mmol/L — ABNORMAL LOW (ref 22–32)
Calcium: 8.8 mg/dL — ABNORMAL LOW (ref 8.9–10.3)
Chloride: 109 mmol/L (ref 98–111)
Creatinine, Ser: 1.64 mg/dL — ABNORMAL HIGH (ref 0.61–1.24)
GFR, Estimated: 42 mL/min — ABNORMAL LOW (ref >60)
Glucose, Bld: 80 mg/dL (ref 70–99)
Potassium: 4 mmol/L (ref 3.5–5.1)
Sodium: 138 mmol/L (ref 135–145)

## 2023-08-26 LAB — CBC WITH DIFFERENTIAL/PLATELET
Abs Immature Granulocytes: 0.51 10*3/uL — ABNORMAL HIGH (ref 0.00–0.07)
Basophils Absolute: 0.1 10*3/uL (ref 0.0–0.1)
Basophils Relative: 1 %
Eosinophils Absolute: 0.3 10*3/uL (ref 0.0–0.5)
Eosinophils Relative: 4 %
HCT: 32.3 % — ABNORMAL LOW (ref 39.0–52.0)
Hemoglobin: 10.8 g/dL — ABNORMAL LOW (ref 13.0–17.0)
Immature Granulocytes: 7 %
Lymphocytes Relative: 7 %
Lymphs Abs: 0.5 10*3/uL — ABNORMAL LOW (ref 0.7–4.0)
MCH: 33.1 pg (ref 26.0–34.0)
MCHC: 33.4 g/dL (ref 30.0–36.0)
MCV: 99.1 fL (ref 80.0–100.0)
Monocytes Absolute: 0.6 10*3/uL (ref 0.1–1.0)
Monocytes Relative: 7 %
Neutro Abs: 5.8 10*3/uL (ref 1.7–7.7)
Neutrophils Relative %: 74 %
Platelets: 421 10*3/uL — ABNORMAL HIGH (ref 150–400)
RBC: 3.26 MIL/uL — ABNORMAL LOW (ref 4.22–5.81)
RDW: 13.9 % (ref 11.5–15.5)
Smear Review: NORMAL
WBC: 7.7 10*3/uL (ref 4.0–10.5)
nRBC: 0 % (ref 0.0–0.2)

## 2023-08-26 LAB — PROTIME-INR
INR: 1 (ref 0.8–1.2)
Prothrombin Time: 13.5 s (ref 11.4–15.2)

## 2023-08-26 MED ORDER — FENTANYL CITRATE (PF) 100 MCG/2ML IJ SOLN
INTRAMUSCULAR | Status: AC | PRN
  Administered 2023-08-26: 50 ug via INTRAVENOUS

## 2023-08-26 MED ORDER — IOHEXOL 300 MG/ML  SOLN
50.0000 mL | Freq: Once | INTRAMUSCULAR | Status: AC | PRN
  Administered 2023-08-26: 5 mL

## 2023-08-26 MED ORDER — MIDAZOLAM HCL 2 MG/2ML IJ SOLN
INTRAMUSCULAR | Status: AC
  Filled 2023-08-26: qty 2

## 2023-08-26 MED ORDER — MIDAZOLAM HCL 2 MG/2ML IJ SOLN
INTRAMUSCULAR | Status: AC | PRN
  Administered 2023-08-26: 1 mg via INTRAVENOUS

## 2023-08-26 MED ORDER — SODIUM CHLORIDE 0.9 % IV SOLN: INTRAVENOUS | Status: DC

## 2023-08-26 MED ORDER — LIDOCAINE HCL 1 % IJ SOLN
20.0000 mL | Freq: Once | INTRAMUSCULAR | Status: AC
  Administered 2023-08-26: 10 mL
  Filled 2023-08-26: qty 20

## 2023-08-26 MED ORDER — FENTANYL CITRATE (PF) 100 MCG/2ML IJ SOLN
INTRAMUSCULAR | Status: AC
  Filled 2023-08-26: qty 2

## 2023-08-26 MED ORDER — LIDOCAINE HCL 1 % IJ SOLN
INTRAMUSCULAR | Status: AC
  Filled 2023-08-26: qty 20

## 2023-08-26 NOTE — Procedures (Signed)
 Interventional Radiology Procedure Note  Procedure:   Image guided placement of supra-pubic catheter, 62F council tip balloon retention catheter.    Complications: None EBL: none  Recommendations:  - Ok to remove foley cath - Continue SP tube to gravity - routine wound care - ok to shower in 24 hours, do not submerge - OK to start exchanging at the bedside with 62F balloon catheter in ~8weeks.  - Advance diet - 1 hr dc home - OK to restart all home meds, including ASA  Signed,  Yvone Neu. Loreta Ave, DO, ABVM, RPVI

## 2023-09-01 DIAGNOSIS — I10 Essential (primary) hypertension: Secondary | ICD-10-CM | POA: Diagnosis not present

## 2023-09-01 DIAGNOSIS — N1832 Chronic kidney disease, stage 3b: Secondary | ICD-10-CM | POA: Diagnosis not present

## 2023-09-01 DIAGNOSIS — N179 Acute kidney failure, unspecified: Secondary | ICD-10-CM | POA: Diagnosis not present

## 2023-09-15 DIAGNOSIS — Z9889 Other specified postprocedural states: Secondary | ICD-10-CM | POA: Diagnosis not present

## 2023-09-15 DIAGNOSIS — Z0100 Encounter for examination of eyes and vision without abnormal findings: Secondary | ICD-10-CM | POA: Diagnosis not present

## 2023-09-15 DIAGNOSIS — Z961 Presence of intraocular lens: Secondary | ICD-10-CM | POA: Diagnosis not present

## 2023-09-23 DIAGNOSIS — E782 Mixed hyperlipidemia: Secondary | ICD-10-CM | POA: Diagnosis not present

## 2023-09-23 DIAGNOSIS — I38 Endocarditis, valve unspecified: Secondary | ICD-10-CM | POA: Diagnosis not present

## 2023-09-23 DIAGNOSIS — I10 Essential (primary) hypertension: Secondary | ICD-10-CM | POA: Diagnosis not present

## 2023-09-23 DIAGNOSIS — I2510 Atherosclerotic heart disease of native coronary artery without angina pectoris: Secondary | ICD-10-CM | POA: Diagnosis not present

## 2023-09-24 DIAGNOSIS — R911 Solitary pulmonary nodule: Secondary | ICD-10-CM | POA: Diagnosis not present

## 2023-09-24 DIAGNOSIS — E871 Hypo-osmolality and hyponatremia: Secondary | ICD-10-CM | POA: Diagnosis not present

## 2023-09-24 DIAGNOSIS — N1330 Unspecified hydronephrosis: Secondary | ICD-10-CM | POA: Diagnosis not present

## 2023-09-24 DIAGNOSIS — N1832 Chronic kidney disease, stage 3b: Secondary | ICD-10-CM | POA: Diagnosis not present

## 2023-09-24 DIAGNOSIS — D519 Vitamin B12 deficiency anemia, unspecified: Secondary | ICD-10-CM | POA: Diagnosis not present

## 2023-09-24 DIAGNOSIS — I129 Hypertensive chronic kidney disease with stage 1 through stage 4 chronic kidney disease, or unspecified chronic kidney disease: Secondary | ICD-10-CM | POA: Diagnosis not present

## 2023-09-24 DIAGNOSIS — R634 Abnormal weight loss: Secondary | ICD-10-CM | POA: Diagnosis not present

## 2023-09-26 ENCOUNTER — Telehealth: Payer: Self-pay | Admitting: *Deleted

## 2023-09-26 ENCOUNTER — Other Ambulatory Visit: Payer: Self-pay | Admitting: Oncology

## 2023-09-26 ENCOUNTER — Inpatient Hospital Stay

## 2023-09-26 ENCOUNTER — Encounter: Payer: Self-pay | Admitting: Oncology

## 2023-09-26 ENCOUNTER — Inpatient Hospital Stay: Attending: Oncology | Admitting: Oncology

## 2023-09-26 VITALS — BP 91/75 | HR 100 | Temp 86.0°F | Resp 16 | Wt 123.0 lb

## 2023-09-26 DIAGNOSIS — Z8042 Family history of malignant neoplasm of prostate: Secondary | ICD-10-CM | POA: Diagnosis not present

## 2023-09-26 DIAGNOSIS — D649 Anemia, unspecified: Secondary | ICD-10-CM

## 2023-09-26 DIAGNOSIS — R634 Abnormal weight loss: Secondary | ICD-10-CM | POA: Diagnosis not present

## 2023-09-26 DIAGNOSIS — N189 Chronic kidney disease, unspecified: Secondary | ICD-10-CM | POA: Diagnosis not present

## 2023-09-26 DIAGNOSIS — D72829 Elevated white blood cell count, unspecified: Secondary | ICD-10-CM | POA: Insufficient documentation

## 2023-09-26 DIAGNOSIS — R911 Solitary pulmonary nodule: Secondary | ICD-10-CM

## 2023-09-26 DIAGNOSIS — E039 Hypothyroidism, unspecified: Secondary | ICD-10-CM | POA: Diagnosis not present

## 2023-09-26 LAB — CBC WITH DIFFERENTIAL/PLATELET
Abs Immature Granulocytes: 0.25 10*3/uL — ABNORMAL HIGH (ref 0.00–0.07)
Basophils Absolute: 0.1 10*3/uL (ref 0.0–0.1)
Basophils Relative: 1 %
Eosinophils Absolute: 0.1 10*3/uL (ref 0.0–0.5)
Eosinophils Relative: 1 %
HCT: 32 % — ABNORMAL LOW (ref 39.0–52.0)
Hemoglobin: 10.8 g/dL — ABNORMAL LOW (ref 13.0–17.0)
Immature Granulocytes: 2 %
Lymphocytes Relative: 5 %
Lymphs Abs: 0.5 10*3/uL — ABNORMAL LOW (ref 0.7–4.0)
MCH: 33.1 pg (ref 26.0–34.0)
MCHC: 33.8 g/dL (ref 30.0–36.0)
MCV: 98.2 fL (ref 80.0–100.0)
Monocytes Absolute: 0.7 10*3/uL (ref 0.1–1.0)
Monocytes Relative: 6 %
Neutro Abs: 9.8 10*3/uL — ABNORMAL HIGH (ref 1.7–7.7)
Neutrophils Relative %: 85 %
Platelets: 300 10*3/uL (ref 150–400)
RBC: 3.26 MIL/uL — ABNORMAL LOW (ref 4.22–5.81)
RDW: 14.6 % (ref 11.5–15.5)
WBC: 11.4 10*3/uL — ABNORMAL HIGH (ref 4.0–10.5)
nRBC: 0 % (ref 0.0–0.2)

## 2023-09-26 LAB — RETIC PANEL
Immature Retic Fract: 9.1 % (ref 2.3–15.9)
RBC.: 3.32 MIL/uL — ABNORMAL LOW (ref 4.22–5.81)
Retic Count, Absolute: 60.8 10*3/uL (ref 19.0–186.0)
Retic Ct Pct: 1.8 % (ref 0.4–3.1)
Reticulocyte Hemoglobin: 35.9 pg (ref >27.9)

## 2023-09-26 LAB — IRON AND TIBC
Iron: 41 ug/dL — ABNORMAL LOW (ref 45–182)
Saturation Ratios: 15 % — ABNORMAL LOW (ref 17.9–39.5)
TIBC: 280 ug/dL (ref 250–450)
UIBC: 239 ug/dL

## 2023-09-26 LAB — FERRITIN: Ferritin: 323 ng/mL (ref 24–336)

## 2023-09-26 LAB — VITAMIN B12: Vitamin B-12: >7500 pg/mL — ABNORMAL HIGH (ref 180–914)

## 2023-09-26 LAB — TECHNOLOGIST SMEAR REVIEW: Plt Morphology: ADEQUATE

## 2023-09-26 LAB — LACTATE DEHYDROGENASE: LDH: 206 U/L — ABNORMAL HIGH (ref 98–192)

## 2023-09-26 LAB — TSH: TSH: 13.526 u[IU]/mL — ABNORMAL HIGH (ref 0.350–4.500)

## 2023-09-26 LAB — FOLATE: Folate: 27 ng/mL (ref >5.9)

## 2023-09-26 NOTE — Progress Notes (Signed)
 Pt and brother in law who is patient POA referred by Dr Oletta Berry.  Pt reports has had UTI's for last 2 months and has a suprapubic catheter.  Over the 2 months he reports a decreased appetite and drastic weight loss of 40lbs.

## 2023-09-26 NOTE — Assessment & Plan Note (Signed)
 Probably anemia secondary to chronic kidney disease.  Rule out other etiologies. Check CBC, smear, B12, folate, iron, TIBC ferritin, protein electrophoresis. Lab Results  Component Value Date   HGB 10.8 (L) 09/26/2023   TIBC 280 09/26/2023   IRONPCTSAT 15 (L) 09/26/2023   FERRITIN 323 09/26/2023

## 2023-09-26 NOTE — Addendum Note (Signed)
 Addended by: [name] on: 09/26/2023 04:49 PM   Modules accepted: Orders

## 2023-09-26 NOTE — Assessment & Plan Note (Addendum)
 Check thyroid  function. Recommend nutrition supplements

## 2023-09-26 NOTE — Progress Notes (Signed)
 Hematology/Oncology Consult note [phone] [phone]        REFERRING PROVIDER: Lateef, Munsoor, MD   CHIEF COMPLAINTS/REASON FOR VISIT:  Evaluation of anemia, lung nodule.    ASSESSMENT & PLAN:   Normocytic anemia Probably anemia secondary to chronic kidney disease.  Rule out other etiologies. Check CBC, smear, B12, folate, iron, TIBC ferritin, protein electrophoresis. Lab Results  Component Value Date   HGB 10.8 (L) 09/26/2023   TIBC 280 09/26/2023   IRONPCTSAT 15 (L) 09/26/2023   FERRITIN 323 09/26/2023      Leukocytosis In the context of weight loss, early satiety, check flowcytometry.  Lung nodule CT findings were reviewed and discussed with patient.  I will Obtain a dedicated CT chest without contrast.  Weight loss Check thyroid  function. Recommend nutrition supplements   Orders Placed This Encounter  Procedures   CT Chest Wo Contrast    Standing Status:   Future    Expected Date:   10/03/2023    Expiration Date:   09/25/2024    Preferred imaging location?:   [facility]   Vitamin B12    Standing Status:   Future    Number of Occurrences:   1    Expected Date:   09/26/2023    Expiration Date:   09/25/2024   Folate    Standing Status:   Future    Number of Occurrences:   1    Expected Date:   09/26/2023    Expiration Date:   09/25/2024   Ferritin    Standing Status:   Future    Number of Occurrences:   1    Expected Date:   09/26/2023    Expiration Date:   03/27/2024   Iron and TIBC    Standing Status:   Future    Number of Occurrences:   1    Expected Date:   09/26/2023    Expiration Date:   09/25/2024   CBC with Differential/Platelet    Standing Status:   Future    Number of Occurrences:   1    Expected Date:   09/26/2023    Expiration Date:   09/25/2024   Retic Panel    Standing Status:   Future    Number of Occurrences:   1    Expected Date:   09/26/2023    Expiration Date:   09/25/2024   Multiple Myeloma Panel  (SPEP&IFE w/QIG)    Standing Status:   Future    Number of Occurrences:   1    Expected Date:   09/26/2023    Expiration Date:   09/25/2024   Kappa/lambda light chains    Standing Status:   Future    Number of Occurrences:   1    Expected Date:   09/26/2023    Expiration Date:   09/25/2024   Lactate dehydrogenase    Standing Status:   Future    Number of Occurrences:   1    Expected Date:   09/26/2023    Expiration Date:   09/25/2024   Technologist smear review    Standing Status:   Future    Number of Occurrences:   1    Expected Date:   09/26/2023    Expiration Date:   09/25/2024    Clinical information::   anemia   TSH    Standing Status:   Future    Number of Occurrences:   1    Expected Date:   09/26/2023  Expiration Date:   09/25/2024   Flow cytometry panel-leukemia/lymphoma work-up    Standing Status:   Future    Number of Occurrences:   1    Expected Date:   09/26/2023    Expiration Date:   09/25/2024   Follow-up in a few weeks to review results. All questions were answered. The patient knows to call the clinic with any problems, questions or concerns.  [name], MD, PhD [facility] Hematology Oncology 09/26/2023   HISTORY OF PRESENTING ILLNESS:   Zachary [name] is a  83 y.o.  male with PMH listed below was seen in consultation at the request of  Rhesa Celeste, Munsoor, MD  for evaluation of anemia and lung nodule.   Patient reports feeling tired and fatigued. He has lost a weight since his recent hospitalization in March 2025.  + Early satiety  he was accompanied by his brother-in-law who is also his POA Patient was admitted 08/05/2023 - 08/08/2022 secondary to urinary retention with severe bladder distention, hydronephrosis, evidence of left calyceal rupture, left hematuria.  Patient has suprapubic catheter placed.  Patient distally manages her catheter well and drains 4 times a day.  He has an appointment with urology next month.  PSA was normal.  Mild prostamegaly  08/08/2023  CT abdomen pelvis without contrast showed new nodule 9 x 8 mm right middle lobe new from 2023.  Patient developed AKI secondary to bladder outlet obstruction. He has a history of chronic kidney disease.  Most recent creatinine was 1.64, close to his baseline   MEDICAL HISTORY:  Past Medical History:  Diagnosis Date   B12 deficiency anemia 08/03/2019   Hypercholesterolemia    Hypertension    Vertigo    last episode approx 09/2018    SURGICAL HISTORY: Past Surgical History:  Procedure Laterality Date   APPENDECTOMY     CATARACT EXTRACTION Bilateral    COLONOSCOPY WITH PROPOFOL  N/A 12/24/2018   Procedure: COLONOSCOPY WITH PROPOFOL ;  Surgeon: Marnee Sink, MD;  Location: [facility];  Service: Endoscopy;  Laterality: N/A;   COLONOSCOPY WITH PROPOFOL  N/A 12/13/2019   Procedure: COLONOSCOPY WITH BIOPSY ;  Surgeon: Marnee Sink, MD;  Location: [facility];  Service: Endoscopy;  Laterality: N/A;  priority 3   IR CYSTOSTOMY TUBE PLACEMENT/BLADDER ASPIRATION  08/26/2023   PLEURAL SCARIFICATION  05/2015   POLYPECTOMY N/A 12/13/2019   Procedure: POLYPECTOMY;  Surgeon: Marnee Sink, MD;  Location: [facility];  Service: Endoscopy;  Laterality: N/A;    SOCIAL HISTORY: Social History   Socioeconomic History   Marital status: Married    Spouse name: Not on file   Number of children: 0   Years of education: Not on file   Highest education level: 12th grade  Occupational History   Occupation: retired  Tobacco Use   Smoking status: Never   Smokeless tobacco: Never  Vaping Use   Vaping status: Never Used  Substance and Sexual Activity   Alcohol  use: No    Alcohol /week: 0.0 standard drinks of alcohol    Drug use: No   Sexual activity: Not on file  Other Topics Concern   Not on file  Social History Narrative   Lives with [name], wife.    Social Drivers of Corporate investment banker Strain: Low Risk  (09/25/2021)   Overall Financial Resource Strain (CARDIA)     Difficulty of Paying Living Expenses: Not hard at all  Food Insecurity: No Food Insecurity (09/26/2023)   Hunger Vital Sign    Worried About  Running Out of Food in the Last Year: Never true    Ran Out of Food in the Last Year: Never true  Transportation Needs: No Transportation Needs (09/26/2023)   PRAPARE - Administrator, Civil Service (Medical): No    Lack of Transportation (Non-Medical): No  Physical Activity: Insufficiently Active (09/25/2021)   Exercise Vital Sign    Days of Exercise per Week: 2 days    Minutes of Exercise per Session: 60 min  Stress: No Stress Concern Present (11/24/2019)   [name]    Feeling of Stress : Not at all  Social Connections: Moderately Isolated (08/05/2023)   Social Connection and Isolation Panel [NHANES]    Frequency of Communication with Friends and Family: Twice a week    Frequency of Social Gatherings with Friends and Family: Once a week    Attends Religious Services: Never    Database administrator or Organizations: No    Attends Banker Meetings: Never    Marital Status: Married  Catering manager Violence: Not At Risk (09/26/2023)   Humiliation, Afraid, Rape, and Kick questionnaire    Fear of Current or Ex-Partner: No    Emotionally Abused: No    Physically Abused: No    Sexually Abused: No    FAMILY HISTORY: Family History  Problem Relation Age of Onset   Hypertension Mother    Hyperlipidemia Mother    Heart attack Father    Hypertension Father    CVA Father    ALS Brother    Prostate cancer Brother     ALLERGIES:  has no known allergies.  MEDICATIONS:  Current Outpatient Medications  Medication Sig Dispense Refill   acetaminophen  (TYLENOL ) 325 MG tablet Take 2 tablets (650 mg total) by mouth every 6 (six) hours as needed for mild pain (pain score 1-3), headache or fever (or Fever >/= 101).     [Paused] aspirin  EC 81 MG tablet Take 81 mg by  mouth at bedtime. Pt states he has resumed as of 09/26/23     atorvastatin  (LIPITOR) 10 MG tablet Take 10 mg by mouth daily.     calcium  carbonate (OS-CAL - DOSED IN MG OF ELEMENTAL CALCIUM ) 1250 (500 Ca) MG tablet Take 1 tablet (1,250 mg total) by mouth 2 (two) times daily with a meal. 30 tablet 0   Multiple Vitamin (MULTIVITAMIN WITH MINERALS) TABS tablet Take 1 tablet by mouth daily.     triamterene -hydrochlorothiazide (DYAZIDE) 37.5-25 MG capsule Take 1 capsule by mouth daily. (Patient not taking: Reported on 09/26/2023)     No current facility-administered medications for this visit.    Review of Systems  Constitutional:  Positive for appetite change, fatigue and unexpected weight change. Negative for chills and fever.  HENT:   Negative for hearing loss and voice change.   Eyes:  Negative for eye problems and icterus.  Respiratory:  Negative for chest tightness, cough and shortness of breath.   Cardiovascular:  Negative for chest pain and leg swelling.  Gastrointestinal:  Negative for abdominal distention and abdominal pain.  Endocrine: Negative for hot flashes.  Genitourinary:  Negative for difficulty urinating, dysuria and frequency.   Musculoskeletal:  Negative for arthralgias.  Skin:  Negative for itching and rash.  Neurological:  Negative for light-headedness and numbness.  Hematological:  Negative for adenopathy. Does not bruise/bleed easily.  Psychiatric/Behavioral:  Negative for confusion.    PHYSICAL EXAMINATION: ECOG PERFORMANCE STATUS: 2 - Symptomatic, <50% confined  to bed Vitals:   09/26/23 1233  BP: 91/75  Pulse: 100  Resp: 16  Temp: (!) 86 F (30 C)  SpO2: 100%   Filed Weights   09/26/23 1233  Weight: 123 lb (55.8 kg)    Physical Exam Constitutional:      General: He is not in acute distress. HENT:     Head: Normocephalic and atraumatic.  Eyes:     General: No scleral icterus. Cardiovascular:     Rate and Rhythm: Normal rate and regular rhythm.   Pulmonary:     Effort: Pulmonary effort is normal. No respiratory distress.     Breath sounds: No wheezing.  Abdominal:     General: Bowel sounds are normal. There is no distension.     Palpations: Abdomen is soft.  Musculoskeletal:        General: No deformity. Normal range of motion.     Cervical back: Normal range of motion and neck supple.  Skin:    General: Skin is warm and dry.     Findings: No erythema or rash.  Neurological:     Mental Status: He is alert and oriented to person, place, and time. Mental status is at baseline.  Psychiatric:        Mood and Affect: Mood normal.     LABORATORY DATA:  I have reviewed the data as listed    Latest Ref Rng & Units 09/26/2023    1:00 PM 08/26/2023    9:13 AM 08/13/2023   12:24 PM  CBC  WBC 4.0 - 10.5 K/uL 11.4  7.7  15.4   Hemoglobin 13.0 - 17.0 g/dL 91.4  78.2  95.6   Hematocrit 39.0 - 52.0 % 32.0  32.3  30.6   Platelets 150 - 400 K/uL 300  421  321       Latest Ref Rng & Units 08/26/2023    9:13 AM 08/13/2023   12:24 PM 08/08/2023    5:04 AM  CMP  Glucose 70 - 99 mg/dL 80  213  086   BUN 8 - 23 mg/dL 25  52  74   Creatinine 0.61 - 1.24 mg/dL 5.78  4.69  6.29   Sodium 135 - 145 mmol/L 138  136  138   Potassium 3.5 - 5.1 mmol/L 4.0  3.6  3.7   Chloride 98 - 111 mmol/L 109  103  103   CO2 22 - 32 mmol/L 21  23  23    Calcium  8.9 - 10.3 mg/dL 8.8  8.6  7.8       RADIOGRAPHIC STUDIES: I have personally reviewed the radiological images as listed and agreed with the findings in the report. IR CYSTOSTOMY TUBE PLACEMENT/BLADDER ASPIRATION Result Date: 08/26/2023 INDICATION: 83 year old male presents for suprapubic catheter placement EXAM: IMAGE GUIDED SUPRAPUBIC CATHETER PLACEMENT COMPARISON:  CT 08/08/2023 MEDICATIONS: None ANESTHESIA/SEDATION: Moderate (conscious) sedation was employed during this procedure. A total of Versed  1.0 mg and Fentanyl  50 mcg was administered intravenously by the radiology nurse. Total intra-service  moderate Sedation Time: 14 minutes. The patient's level of consciousness and vital signs were monitored continuously by radiology nursing throughout the procedure under my direct supervision. CONTRAST:  5 cc-administered into the collecting system(s) FLUOROSCOPY: Radiation Exposure Index (as provided by the fluoroscopic device): 1 mGy Kerma COMPLICATIONS: None PROCEDURE: Informed written consent was obtained from the patient after a thorough discussion of the procedural risks, benefits and alternatives. All questions were addressed. Maximal Sterile Barrier Technique was utilized including caps, mask, sterile  gowns, sterile gloves, sterile drape, hand hygiene and skin antiseptic. A timeout was performed prior to the initiation of the procedure. Patient was positioned supine under the image intensifier. The pelvic region was prepped and draped in the usual sterile fashion. Ultrasound images were performed with images stored sent to PACs. 1% lidocaine  was used for local anesthesia of the skin and subcutaneous tissues. Small stab incision was made with 11 blade scalpel and blunt dissection was performed. Under ultrasound 18 gauge trocar needle was advanced into the urinary bladder in the midline. We confirmed needle tip position the stylet was removed and a wire was advanced into the bladder under ultrasound guidance. Needle was removed. Over the wire, a coaxial system of a 7 mm diameter, 80 mm length balloon and a 16 French Councill tip balloon retention urinary catheter advanced to the soft tissues. The balloon was gently inflated to profile. Once gentle dilation was performed the balloon was deflated and some attain E asleep the balloon and Council tip catheter were advanced into the urinary bladder under fluoroscopic guidance on the wire. Balloon was completely deflated and the balloon catheter was removed with the wire. 8 cc of saline used to inflate the new Council tip balloon retention catheter, which was  withdrawn to the bladder wall. Contrast was injected confirming location with images stored and sent to PACs. The indwelling urinary catheter was then removed. Sterile dressing placed. Patient tolerated the procedure well and remained hemodynamically stable throughout. No complications were encountered and no significant blood loss. IMPRESSION: Status post image guided placement of suprapubic 16 French balloon retention Council tip catheter. Signed, Marciano Settles. Dalene Duck, ABVM, RPVI [facility] Radiology PLAN: After approximately 8 weeks to allow the tract to mature, bedside catheter exchange may proceed. Electronically Signed   By: Myrlene Asper D.O.   On: 08/26/2023 13:10   CT ABDOMEN PELVIS WO CONTRAST Result Date: 08/08/2023 CLINICAL DATA:  Peritonitis or perforation suspected. Abdominal pain. EXAM: CT ABDOMEN AND PELVIS WITHOUT CONTRAST TECHNIQUE: Multidetector CT imaging of the abdomen and pelvis was performed following the standard protocol without IV contrast. RADIATION DOSE REDUCTION: This exam was performed according to the departmental dose-optimization program which includes automated exposure control, adjustment of the mA and/or kV according to patient size and/or use of iterative reconstruction technique. COMPARISON:  CT abdomen pelvis without contrast 08/05/2023, CT abdomen and pelvis with contrast 02/22/2022. FINDINGS: Lower chest: There are emphysematous and scarring changes in both lung bases. Small layering left pleural effusion again is noted, mild bronchial thickening in the lower lobes without infiltrates. There is a 9 x 8 mm right middle lobe nodule again noted new from 2023. Large hiatal hernia with intrathoracic stomach. The cardiac size is normal. There are coronary artery calcifications. Small pericardial effusion anteriorly. Hepatobiliary: No focal liver abnormality is seen without contrast. No calcified gallstones, gallbladder wall  thickening, or biliary dilatation. Pancreas: No abnormality is seen without contrast. Spleen: No abnormality is seen without contrast. Adrenals/Urinary Tract: Stable 1.4 cm left adrenal myelolipoma. Stable 1.5 cm left adrenal genu nodule, above the typical density of an adenoma but stable in size and most likely a lipid poor adenoma. Normal right adrenal gland. There is a 2.6 cm Bosniak 1 cyst in the upper pole of the right kidney, Hounsfield density is 13. In the left upper pole, there is a Bosniak 1 cyst measuring 1.5 cm, 17 Hounsfield units. There is bilateral cortical thinning and moderate but improved hydroureteronephrosis. There is a small  volume of perinephric fluid underlying the left kidney inferiorly, previously greater. There was previously right perinephric fluid which has cleared. Perinephric stranding is seen greater on the left but has also improved. The bladder has been catheterized since the prior study which showed severe bladder dilatation. A large diverticulum of the posterior wall is again noted and partially decompressed. There is mild distention of the bladder today, reaching up to the level of L5-S1, but this has markedly improved. There is wall thickening in the bladder eccentric anteriorly and to the right, unclear if this is muscular hypertrophy, inflammatory or infiltrating. Small amount of air in the anterior bladder. Cystoscopy recommended. Stomach/Bowel: No dilatation or wall thickening. Moderate fecal stasis. Large hiatal hernia as above. Advanced sigmoid diverticulosis without evidence of diverticulitis. An appendix is not seen. Vascular/Lymphatic: Aortic atherosclerosis. No enlarged abdominal or pelvic lymph nodes. Reproductive: Mild prostatomegaly with dystrophic calcifications. Other: Previously there was mesenteric congestion and body wall edema which have resolved. There is trace posterior deep pelvic ascites. There is no free hemorrhage, free air or incarcerated hernia.  Musculoskeletal: Degenerative change and mild levoscoliosis lumbar spine, apex L1-2. Osteopenia. No acute or other significant osseous findings. IMPRESSION: 1. The bladder has been catheterized since the prior study which showed severe bladder dilatation. There is mild distention of the bladder today, reaching up to the level of L5-S1, but this has markedly improved. 2. There is wall thickening in the bladder eccentric anteriorly and to the right, unclear if this is muscular hypertrophy, inflammatory or infiltrating. Small amount of air in the anterior bladder. Cystoscopy recommended. 3. Moderate but improved bilateral hydroureteronephrosis. 4. Small volume of perinephric fluid underlying the left kidney inferiorly, previously greater. Prior right perinephric fluid has cleared. 5. Bilateral renal cysts. 6. Constipation and diverticulosis. 7. Large hiatal hernia with intrathoracic stomach. 8. Aortic and coronary artery atherosclerosis. 9. Small left pleural effusion, unchanged. 10. 9 x 8 mm right middle lobe nodule new from 2023. Per Fleischner Society Guidelines, recommend prompt non-contrast Chest CT for further evaluation. These guidelines do not apply to immunocompromised patients and patients with cancer. Follow up in patients with significant comorbidities as clinically warranted. For lung cancer screening, adhere to Lung-RADS guidelines. Reference: Radiology. 2017; 284(1):228-43. 11. Osteopenia and degenerative change. Aortic Atherosclerosis (ICD10-I70.0) and Emphysema (ICD10-J43.9). Electronically Signed   By: Denman Fischer M.D.   On: 08/08/2023 04:53   US  Renal Result Date: 08/06/2023 CLINICAL DATA:  Acute renal failure. EXAM: RENAL / URINARY TRACT ULTRASOUND COMPLETE COMPARISON:  None Available. FINDINGS: Right Kidney: Renal measurements: 10.7 cm x 5.6 cm x 4.3 cm = volume: 135.9 mL. There is diffuse renal cortical thinning. Diffusely increased echogenicity of the renal parenchyma is noted. A 2.5 cm x  2.6 cm x 2.3 cm cyst is seen within the upper pole of the right kidney. There is moderate severity hydronephrosis. Left Kidney: Renal measurements: 10.3 cm x 4.6 cm x 4.3 cm = volume: 106.4 mL. There is diffuse renal cortical thinning. Diffusely increased echogenicity of the renal parenchyma is noted. A 0.9 cm x 1.0 cm x 0.7 cm cyst is seen within the left kidney. There is moderate severity hydronephrosis. Bladder: A Foley catheter is in place. Other: None. IMPRESSION: 1. Bilateral echogenic kidneys which may represent sequelae associated with medical renal disease. 2. Bilateral renal cysts. 3. Moderate severity bilateral hydronephrosis. Electronically Signed   By: Virgle Grime M.D.   On: 08/06/2023 00:30   CT ABDOMEN PELVIS WO CONTRAST Result Date: 08/05/2023 CLINICAL DATA:  Acute  on chronic kidney disease and hematuria EXAM: CT ABDOMEN AND PELVIS WITHOUT CONTRAST TECHNIQUE: Multidetector CT imaging of the abdomen and pelvis was performed following the standard protocol without IV contrast. RADIATION DOSE REDUCTION: This exam was performed according to the departmental dose-optimization program which includes automated exposure control, adjustment of the mA and/or kV according to patient size and/or use of iterative reconstruction technique. COMPARISON:  CT chest dated 03/04/2022, CT abdomen and pelvis dated 02/22/2022 FINDINGS: Lower chest: New 9 x 8 mm right middle lobe nodule (3:4). Small left pleural effusion with relaxation atelectasis of the left lower lobe. Partially imaged heart size is normal. Coronary artery calcifications. Hepatobiliary: No focal hepatic lesions. No intra or extrahepatic biliary ductal dilation. Normal gallbladder. Pancreas: No focal lesions or main ductal dilation. Spleen: Normal in size without focal abnormality. Adrenals/Urinary Tract: 10 mm left adrenal nodule (2:24) contains macroscopic fat, -53 HU, in keeping with myelolipoma. Additional 12 mm nodule measures 36 HU, not  substantially changed in size from 02/22/2022, likely adenoma. No specific follow-up imaging recommended. No right adrenal nodule. No suspicious renal masses by noncontrast technique. Bilateral diffuse renal cortical thinning with severe hydroureteronephrosis to the level of the markedly distended urinary bladder. Asymmetric small to moderate volume left perinephric/retroperitoneal free fluid. Markedly distended urinary bladder with a large diverticulum arising from the posterior left. Stomach/Bowel: Large hiatal hernia contains nearly the entire stomach. Normal appearance of the stomach. No evidence of bowel wall thickening, distention, or inflammatory changes. Colonic diverticulosis without acute diverticulitis. Normal appendix. Vascular/Lymphatic: Aortic atherosclerosis. IVC is markedly effaced by the markedly distended urinary bladder and right ureter immediately above the iliac bifurcation. No enlarged abdominal or pelvic lymph nodes. Reproductive: Mildly enlarged prostate gland containing dystrophic calcifications. Other: Small volume perihepatic free fluid in addition to left retroperitoneal free fluid as above. No free air fluid collection. Musculoskeletal: No acute or abnormal lytic or blastic osseous lesions. IMPRESSION: 1. Markedly distended urinary bladder with increased severe bilateral hydroureteronephrosis to the level of the markedly distended urinary bladder in keeping with bladder outlet obstruction. Asymmetric small to moderate volume left perinephric/retroperitoneal free fluid, which may be reactive or secondary to rupture of renal collecting system. 2. New 9 x 8 mm right middle lobe nodule. Consider one of the following in 3 months for both low-risk and high-risk individuals: (a) repeat chest CT, (b) follow-up PET-CT, or (c) tissue sampling. This recommendation follows the consensus statement: Guidelines for Management of Incidental Pulmonary Nodules Detected on CT Images: From the Fleischner  Society 2017; Radiology 2017; 284:228-243. 3. Small left pleural effusion with relaxation atelectasis of the left lower lobe. 4. Large hiatal hernia contains nearly the entire stomach. 5. Aortic Atherosclerosis (ICD10-I70.0). Coronary artery calcifications. Assessment for potential risk factor modification, dietary therapy or pharmacologic therapy may be warranted, if clinically indicated. Critical Value/emergent results were called by telephone at the time of interpretation on 08/05/2023 at 1:48 pm to provider MUNSOOR LATEEF , who verbally acknowledged these results. The patient will be escorted to the emergency department. Electronically Signed   By: Limin  Xu M.D.   On: 08/05/2023 13:53

## 2023-09-26 NOTE — Assessment & Plan Note (Signed)
 In the context of weight loss, early satiety, check flowcytometry.

## 2023-09-26 NOTE — Telephone Encounter (Signed)
 After hours and need to add t4 and the lab tech in main lab released the T4 so they can run it.

## 2023-09-26 NOTE — Assessment & Plan Note (Signed)
 CT findings were reviewed and discussed with patient.  I will Obtain a dedicated CT chest without contrast.

## 2023-09-29 LAB — MULTIPLE MYELOMA PANEL, SERUM
Albumin SerPl Elph-Mcnc: 3.1 g/dL (ref 2.9–4.4)
Albumin/Glob SerPl: 1 (ref 0.7–1.7)
Alpha 1: 0.3 g/dL (ref 0.0–0.4)
Alpha2 Glob SerPl Elph-Mcnc: 0.9 g/dL (ref 0.4–1.0)
B-Globulin SerPl Elph-Mcnc: 0.9 g/dL (ref 0.7–1.3)
Gamma Glob SerPl Elph-Mcnc: 1.1 g/dL (ref 0.4–1.8)
Globulin, Total: 3.3 g/dL (ref 2.2–3.9)
IgA: 150 mg/dL (ref 61–437)
IgG (Immunoglobin G), Serum: 1162 mg/dL (ref 603–1613)
IgM (Immunoglobulin M), Srm: 76 mg/dL (ref 15–143)
Total Protein ELP: 6.4 g/dL (ref 6.0–8.5)

## 2023-09-29 LAB — KAPPA/LAMBDA LIGHT CHAINS
Kappa free light chain: 65.4 mg/L — ABNORMAL HIGH (ref 3.3–19.4)
Kappa, lambda light chain ratio: 1.23 (ref 0.26–1.65)
Lambda free light chains: 53.3 mg/L — ABNORMAL HIGH (ref 5.7–26.3)

## 2023-09-30 ENCOUNTER — Inpatient Hospital Stay
Admission: EM | Admit: 2023-09-30 | Discharge: 2023-10-14 | DRG: 871 | Disposition: A | Discharge status: Skilled Nursing Facility | Attending: Hospitalist | Admitting: Hospitalist

## 2023-09-30 ENCOUNTER — Emergency Department

## 2023-09-30 ENCOUNTER — Other Ambulatory Visit: Payer: Self-pay

## 2023-09-30 DIAGNOSIS — Z1152 Encounter for screening for COVID-19: Secondary | ICD-10-CM | POA: Diagnosis not present

## 2023-09-30 DIAGNOSIS — Z8042 Family history of malignant neoplasm of prostate: Secondary | ICD-10-CM

## 2023-09-30 DIAGNOSIS — A4102 Sepsis due to Methicillin resistant Staphylococcus aureus: Secondary | ICD-10-CM | POA: Diagnosis not present

## 2023-09-30 DIAGNOSIS — J9811 Atelectasis: Secondary | ICD-10-CM | POA: Diagnosis not present

## 2023-09-30 DIAGNOSIS — Z7401 Bed confinement status: Secondary | ICD-10-CM | POA: Diagnosis not present

## 2023-09-30 DIAGNOSIS — I959 Hypotension, unspecified: Secondary | ICD-10-CM | POA: Diagnosis present

## 2023-09-30 DIAGNOSIS — G9341 Metabolic encephalopathy: Secondary | ICD-10-CM | POA: Diagnosis present

## 2023-09-30 DIAGNOSIS — I3139 Other pericardial effusion (noninflammatory): Secondary | ICD-10-CM | POA: Diagnosis not present

## 2023-09-30 DIAGNOSIS — E872 Acidosis, unspecified: Secondary | ICD-10-CM | POA: Diagnosis present

## 2023-09-30 DIAGNOSIS — R627 Adult failure to thrive: Secondary | ICD-10-CM | POA: Diagnosis present

## 2023-09-30 DIAGNOSIS — A419 Sepsis, unspecified organism: Principal | ICD-10-CM | POA: Diagnosis present

## 2023-09-30 DIAGNOSIS — R000 Tachycardia, unspecified: Secondary | ICD-10-CM | POA: Diagnosis not present

## 2023-09-30 DIAGNOSIS — D631 Anemia in chronic kidney disease: Secondary | ICD-10-CM | POA: Diagnosis not present

## 2023-09-30 DIAGNOSIS — Z681 Body mass index (BMI) 19 or less, adult: Secondary | ICD-10-CM | POA: Diagnosis not present

## 2023-09-30 DIAGNOSIS — R54 Age-related physical debility: Secondary | ICD-10-CM | POA: Diagnosis present

## 2023-09-30 DIAGNOSIS — R64 Cachexia: Secondary | ICD-10-CM | POA: Diagnosis present

## 2023-09-30 DIAGNOSIS — R918 Other nonspecific abnormal finding of lung field: Secondary | ICD-10-CM | POA: Diagnosis not present

## 2023-09-30 DIAGNOSIS — R634 Abnormal weight loss: Secondary | ICD-10-CM | POA: Diagnosis not present

## 2023-09-30 DIAGNOSIS — R748 Abnormal levels of other serum enzymes: Secondary | ICD-10-CM | POA: Diagnosis not present

## 2023-09-30 DIAGNOSIS — I429 Cardiomyopathy, unspecified: Secondary | ICD-10-CM | POA: Diagnosis present

## 2023-09-30 DIAGNOSIS — J90 Pleural effusion, not elsewhere classified: Secondary | ICD-10-CM | POA: Diagnosis not present

## 2023-09-30 DIAGNOSIS — M6281 Muscle weakness (generalized): Secondary | ICD-10-CM | POA: Diagnosis not present

## 2023-09-30 DIAGNOSIS — R339 Retention of urine, unspecified: Secondary | ICD-10-CM

## 2023-09-30 DIAGNOSIS — N189 Chronic kidney disease, unspecified: Secondary | ICD-10-CM | POA: Diagnosis not present

## 2023-09-30 DIAGNOSIS — Z8619 Personal history of other infectious and parasitic diseases: Secondary | ICD-10-CM

## 2023-09-30 DIAGNOSIS — E785 Hyperlipidemia, unspecified: Secondary | ICD-10-CM | POA: Diagnosis not present

## 2023-09-30 DIAGNOSIS — N390 Urinary tract infection, site not specified: Secondary | ICD-10-CM | POA: Insufficient documentation

## 2023-09-30 DIAGNOSIS — B9562 Methicillin resistant Staphylococcus aureus infection as the cause of diseases classified elsewhere: Secondary | ICD-10-CM | POA: Diagnosis not present

## 2023-09-30 DIAGNOSIS — J984 Other disorders of lung: Secondary | ICD-10-CM

## 2023-09-30 DIAGNOSIS — R590 Localized enlarged lymph nodes: Secondary | ICD-10-CM | POA: Diagnosis present

## 2023-09-30 DIAGNOSIS — I2609 Other pulmonary embolism with acute cor pulmonale: Secondary | ICD-10-CM | POA: Diagnosis not present

## 2023-09-30 DIAGNOSIS — E43 Unspecified severe protein-calorie malnutrition: Secondary | ICD-10-CM | POA: Insufficient documentation

## 2023-09-30 DIAGNOSIS — I2699 Other pulmonary embolism without acute cor pulmonale: Secondary | ICD-10-CM | POA: Diagnosis not present

## 2023-09-30 DIAGNOSIS — Z9359 Other cystostomy status: Secondary | ICD-10-CM | POA: Diagnosis not present

## 2023-09-30 DIAGNOSIS — W19XXXA Unspecified fall, initial encounter: Secondary | ICD-10-CM | POA: Diagnosis present

## 2023-09-30 DIAGNOSIS — J189 Pneumonia, unspecified organism: Secondary | ICD-10-CM | POA: Diagnosis not present

## 2023-09-30 DIAGNOSIS — N179 Acute kidney failure, unspecified: Secondary | ICD-10-CM | POA: Diagnosis not present

## 2023-09-30 DIAGNOSIS — B952 Enterococcus as the cause of diseases classified elsewhere: Secondary | ICD-10-CM | POA: Diagnosis not present

## 2023-09-30 DIAGNOSIS — A4189 Other specified sepsis: Secondary | ICD-10-CM | POA: Diagnosis not present

## 2023-09-30 DIAGNOSIS — K449 Diaphragmatic hernia without obstruction or gangrene: Secondary | ICD-10-CM | POA: Diagnosis not present

## 2023-09-30 DIAGNOSIS — I352 Nonrheumatic aortic (valve) stenosis with insufficiency: Secondary | ICD-10-CM | POA: Diagnosis present

## 2023-09-30 DIAGNOSIS — R509 Fever, unspecified: Secondary | ICD-10-CM | POA: Diagnosis not present

## 2023-09-30 DIAGNOSIS — I129 Hypertensive chronic kidney disease with stage 1 through stage 4 chronic kidney disease, or unspecified chronic kidney disease: Secondary | ICD-10-CM | POA: Diagnosis present

## 2023-09-30 DIAGNOSIS — J15212 Pneumonia due to Methicillin resistant Staphylococcus aureus: Secondary | ICD-10-CM | POA: Diagnosis present

## 2023-09-30 DIAGNOSIS — Z48813 Encounter for surgical aftercare following surgery on the respiratory system: Secondary | ICD-10-CM | POA: Diagnosis not present

## 2023-09-30 DIAGNOSIS — J439 Emphysema, unspecified: Secondary | ICD-10-CM | POA: Diagnosis present

## 2023-09-30 DIAGNOSIS — I2489 Other forms of acute ischemic heart disease: Secondary | ICD-10-CM | POA: Diagnosis present

## 2023-09-30 DIAGNOSIS — Z83438 Family history of other disorder of lipoprotein metabolism and other lipidemia: Secondary | ICD-10-CM

## 2023-09-30 DIAGNOSIS — Z9181 History of falling: Secondary | ICD-10-CM | POA: Diagnosis not present

## 2023-09-30 DIAGNOSIS — I4891 Unspecified atrial fibrillation: Secondary | ICD-10-CM | POA: Diagnosis not present

## 2023-09-30 DIAGNOSIS — R531 Weakness: Secondary | ICD-10-CM | POA: Diagnosis not present

## 2023-09-30 DIAGNOSIS — B9620 Unspecified Escherichia coli [E. coli] as the cause of diseases classified elsewhere: Secondary | ICD-10-CM | POA: Diagnosis not present

## 2023-09-30 DIAGNOSIS — Z7982 Long term (current) use of aspirin: Secondary | ICD-10-CM

## 2023-09-30 DIAGNOSIS — J851 Abscess of lung with pneumonia: Secondary | ICD-10-CM | POA: Diagnosis not present

## 2023-09-30 DIAGNOSIS — Z8249 Family history of ischemic heart disease and other diseases of the circulatory system: Secondary | ICD-10-CM

## 2023-09-30 DIAGNOSIS — R7989 Other specified abnormal findings of blood chemistry: Secondary | ICD-10-CM

## 2023-09-30 DIAGNOSIS — J440 Chronic obstructive pulmonary disease with acute lower respiratory infection: Secondary | ICD-10-CM | POA: Diagnosis present

## 2023-09-30 DIAGNOSIS — J181 Lobar pneumonia, unspecified organism: Secondary | ICD-10-CM | POA: Diagnosis not present

## 2023-09-30 DIAGNOSIS — Z66 Do not resuscitate: Secondary | ICD-10-CM | POA: Diagnosis present

## 2023-09-30 DIAGNOSIS — I10 Essential (primary) hypertension: Secondary | ICD-10-CM | POA: Diagnosis not present

## 2023-09-30 DIAGNOSIS — I214 Non-ST elevation (NSTEMI) myocardial infarction: Secondary | ICD-10-CM | POA: Diagnosis not present

## 2023-09-30 DIAGNOSIS — R7401 Elevation of levels of liver transaminase levels: Secondary | ICD-10-CM | POA: Diagnosis present

## 2023-09-30 DIAGNOSIS — R6521 Severe sepsis with septic shock: Secondary | ICD-10-CM | POA: Diagnosis not present

## 2023-09-30 DIAGNOSIS — J850 Gangrene and necrosis of lung: Secondary | ICD-10-CM | POA: Diagnosis not present

## 2023-09-30 DIAGNOSIS — Z79899 Other long term (current) drug therapy: Secondary | ICD-10-CM

## 2023-09-30 DIAGNOSIS — Z515 Encounter for palliative care: Secondary | ICD-10-CM | POA: Diagnosis not present

## 2023-09-30 DIAGNOSIS — D649 Anemia, unspecified: Secondary | ICD-10-CM | POA: Diagnosis not present

## 2023-09-30 DIAGNOSIS — I2690 Septic pulmonary embolism without acute cor pulmonale: Secondary | ICD-10-CM | POA: Diagnosis not present

## 2023-09-30 DIAGNOSIS — Z823 Family history of stroke: Secondary | ICD-10-CM

## 2023-09-30 DIAGNOSIS — R7881 Bacteremia: Secondary | ICD-10-CM

## 2023-09-30 DIAGNOSIS — Y92009 Unspecified place in unspecified non-institutional (private) residence as the place of occurrence of the external cause: Secondary | ICD-10-CM | POA: Diagnosis not present

## 2023-09-30 DIAGNOSIS — R2689 Other abnormalities of gait and mobility: Secondary | ICD-10-CM | POA: Diagnosis not present

## 2023-09-30 DIAGNOSIS — D519 Vitamin B12 deficiency anemia, unspecified: Secondary | ICD-10-CM | POA: Diagnosis present

## 2023-09-30 DIAGNOSIS — D509 Iron deficiency anemia, unspecified: Secondary | ICD-10-CM | POA: Diagnosis present

## 2023-09-30 DIAGNOSIS — A4151 Sepsis due to Escherichia coli [E. coli]: Secondary | ICD-10-CM | POA: Diagnosis not present

## 2023-09-30 DIAGNOSIS — I2510 Atherosclerotic heart disease of native coronary artery without angina pectoris: Secondary | ICD-10-CM | POA: Diagnosis present

## 2023-09-30 DIAGNOSIS — N139 Obstructive and reflux uropathy, unspecified: Secondary | ICD-10-CM | POA: Diagnosis present

## 2023-09-30 DIAGNOSIS — A403 Sepsis due to Streptococcus pneumoniae: Secondary | ICD-10-CM | POA: Diagnosis not present

## 2023-09-30 DIAGNOSIS — N323 Diverticulum of bladder: Secondary | ICD-10-CM | POA: Diagnosis present

## 2023-09-30 DIAGNOSIS — G928 Other toxic encephalopathy: Secondary | ICD-10-CM

## 2023-09-30 DIAGNOSIS — R051 Acute cough: Secondary | ICD-10-CM | POA: Diagnosis not present

## 2023-09-30 DIAGNOSIS — R262 Difficulty in walking, not elsewhere classified: Secondary | ICD-10-CM | POA: Diagnosis not present

## 2023-09-30 DIAGNOSIS — D72829 Elevated white blood cell count, unspecified: Secondary | ICD-10-CM | POA: Diagnosis not present

## 2023-09-30 DIAGNOSIS — E782 Mixed hyperlipidemia: Secondary | ICD-10-CM | POA: Diagnosis present

## 2023-09-30 DIAGNOSIS — R091 Pleurisy: Secondary | ICD-10-CM | POA: Diagnosis not present

## 2023-09-30 DIAGNOSIS — Z7189 Other specified counseling: Secondary | ICD-10-CM | POA: Diagnosis not present

## 2023-09-30 DIAGNOSIS — R1311 Dysphagia, oral phase: Secondary | ICD-10-CM | POA: Diagnosis not present

## 2023-09-30 DIAGNOSIS — G9340 Encephalopathy, unspecified: Secondary | ICD-10-CM | POA: Insufficient documentation

## 2023-09-30 DIAGNOSIS — R6520 Severe sepsis without septic shock: Secondary | ICD-10-CM | POA: Diagnosis not present

## 2023-09-30 DIAGNOSIS — R41841 Cognitive communication deficit: Secondary | ICD-10-CM | POA: Diagnosis not present

## 2023-09-30 DIAGNOSIS — A4902 Methicillin resistant Staphylococcus aureus infection, unspecified site: Secondary | ICD-10-CM | POA: Diagnosis not present

## 2023-09-30 DIAGNOSIS — N184 Chronic kidney disease, stage 4 (severe): Secondary | ICD-10-CM | POA: Diagnosis not present

## 2023-09-30 DIAGNOSIS — I6529 Occlusion and stenosis of unspecified carotid artery: Secondary | ICD-10-CM | POA: Diagnosis not present

## 2023-09-30 DIAGNOSIS — Z043 Encounter for examination and observation following other accident: Secondary | ICD-10-CM | POA: Diagnosis not present

## 2023-09-30 DIAGNOSIS — I7121 Aneurysm of the ascending aorta, without rupture: Secondary | ICD-10-CM | POA: Diagnosis not present

## 2023-09-30 LAB — URINALYSIS, W/ REFLEX TO CULTURE (INFECTION SUSPECTED)
Bilirubin Urine: NEGATIVE
Glucose, UA: NEGATIVE mg/dL
Ketones, ur: NEGATIVE mg/dL
Nitrite: NEGATIVE
Protein, ur: 30 mg/dL — AB
Specific Gravity, Urine: 1.015 (ref 1.005–1.030)
WBC, UA: >50 WBC/hpf (ref 0–5)
pH: 5 (ref 5.0–8.0)

## 2023-09-30 LAB — CBC WITH DIFFERENTIAL/PLATELET
Abs Immature Granulocytes: 0.77 10*3/uL — ABNORMAL HIGH (ref 0.00–0.07)
Basophils Absolute: 0 10*3/uL (ref 0.0–0.1)
Basophils Relative: 0 %
Eosinophils Absolute: 0 10*3/uL (ref 0.0–0.5)
Eosinophils Relative: 0 %
HCT: 27.4 % — ABNORMAL LOW (ref 39.0–52.0)
Hemoglobin: 9.2 g/dL — ABNORMAL LOW (ref 13.0–17.0)
Immature Granulocytes: 5 %
Lymphocytes Relative: 2 %
Lymphs Abs: 0.3 10*3/uL — ABNORMAL LOW (ref 0.7–4.0)
MCH: 33.5 pg (ref 26.0–34.0)
MCHC: 33.6 g/dL (ref 30.0–36.0)
MCV: 99.6 fL (ref 80.0–100.0)
Monocytes Absolute: 0.9 10*3/uL (ref 0.1–1.0)
Monocytes Relative: 6 %
Neutro Abs: 12.8 10*3/uL — ABNORMAL HIGH (ref 1.7–7.7)
Neutrophils Relative %: 87 %
Platelets: 215 10*3/uL (ref 150–400)
RBC: 2.75 MIL/uL — ABNORMAL LOW (ref 4.22–5.81)
RDW: 14.6 % (ref 11.5–15.5)
Smear Review: NORMAL
WBC: 14.8 10*3/uL — ABNORMAL HIGH (ref 4.0–10.5)
nRBC: 0 % (ref 0.0–0.2)

## 2023-09-30 LAB — TYPE AND SCREEN
ABO/RH(D): O POS
Antibody Screen: NEGATIVE

## 2023-09-30 LAB — COMPREHENSIVE METABOLIC PANEL WITH GFR
ALT: 20 U/L (ref 0–44)
AST: 21 U/L (ref 15–41)
Albumin: 2.4 g/dL — ABNORMAL LOW (ref 3.5–5.0)
Alkaline Phosphatase: 76 U/L (ref 38–126)
Anion gap: 11 (ref 5–15)
BUN: 67 mg/dL — ABNORMAL HIGH (ref 8–23)
CO2: 20 mmol/L — ABNORMAL LOW (ref 22–32)
Calcium: 8.7 mg/dL — ABNORMAL LOW (ref 8.9–10.3)
Chloride: 98 mmol/L (ref 98–111)
Creatinine, Ser: 2.41 mg/dL — ABNORMAL HIGH (ref 0.61–1.24)
GFR, Estimated: 26 mL/min — ABNORMAL LOW (ref >60)
Glucose, Bld: 135 mg/dL — ABNORMAL HIGH (ref 70–99)
Potassium: 4.2 mmol/L (ref 3.5–5.1)
Sodium: 129 mmol/L — ABNORMAL LOW (ref 135–145)
Total Bilirubin: 1.1 mg/dL (ref 0.0–1.2)
Total Protein: 5.8 g/dL — ABNORMAL LOW (ref 6.5–8.1)

## 2023-09-30 LAB — RESP PANEL BY RT-PCR (RSV, FLU A&B, COVID)  RVPGX2
Influenza A by PCR: NEGATIVE
Influenza B by PCR: NEGATIVE
Resp Syncytial Virus by PCR: NEGATIVE
SARS Coronavirus 2 by RT PCR: NEGATIVE

## 2023-09-30 LAB — PROTIME-INR
INR: 1.2 (ref 0.8–1.2)
INR: 2.3 — ABNORMAL HIGH (ref 0.8–1.2)
Prothrombin Time: 15.5 s — ABNORMAL HIGH (ref 11.4–15.2)
Prothrombin Time: 25.4 s — ABNORMAL HIGH (ref 11.4–15.2)

## 2023-09-30 LAB — TROPONIN I (HIGH SENSITIVITY)
Troponin I (High Sensitivity): 229 ng/L (ref <18)
Troponin I (High Sensitivity): 204 ng/L (ref <18)
Troponin I (High Sensitivity): 130 ng/L (ref <18)

## 2023-09-30 LAB — CORTISOL: Cortisol, Plasma: 41.2 ug/dL

## 2023-09-30 LAB — SPECIMEN STATUS REPORT

## 2023-09-30 LAB — PROCALCITONIN: Procalcitonin: 6.79 ng/mL

## 2023-09-30 LAB — LACTIC ACID, PLASMA: Lactic Acid, Venous: 1.4 mmol/L (ref 0.5–1.9)

## 2023-09-30 LAB — BLOOD GAS, VENOUS

## 2023-09-30 LAB — APTT: aPTT: 59 s — ABNORMAL HIGH (ref 24–36)

## 2023-09-30 LAB — MAGNESIUM: Magnesium: 2.1 mg/dL (ref 1.7–2.4)

## 2023-09-30 MED ORDER — SODIUM CHLORIDE 0.9 % IV SOLN
500.0000 mg | Freq: Two times a day (BID) | INTRAVENOUS | Status: DC
  Administered 2023-09-30 – 2023-10-02 (×5): 500 mg via INTRAVENOUS
  Filled 2023-09-30 (×9): qty 10

## 2023-09-30 MED ORDER — ONDANSETRON HCL 4 MG/2ML IJ SOLN: 4.0000 mg | Freq: Four times a day (QID) | INTRAMUSCULAR | Status: DC | PRN

## 2023-09-30 MED ORDER — LACTATED RINGERS IV BOLUS
1000.0000 mL | Freq: Once | INTRAVENOUS | Status: AC
  Administered 2023-09-30: 1000 mL via INTRAVENOUS

## 2023-09-30 MED ORDER — ACETAMINOPHEN 325 MG PO TABS
650.0000 mg | ORAL_TABLET | Freq: Four times a day (QID) | ORAL | Status: DC | PRN
  Administered 2023-10-05: 650 mg via ORAL
  Filled 2023-09-30: qty 2

## 2023-09-30 MED ORDER — METRONIDAZOLE 500 MG/100ML IV SOLN
500.0000 mg | Freq: Once | INTRAVENOUS | Status: AC
  Administered 2023-09-30: 500 mg via INTRAVENOUS
  Filled 2023-09-30: qty 100

## 2023-09-30 MED ORDER — LACTATED RINGERS IV SOLN: INTRAVENOUS | Status: DC

## 2023-09-30 MED ORDER — ACETAMINOPHEN 500 MG PO TABS
1000.0000 mg | ORAL_TABLET | Freq: Once | ORAL | Status: AC
  Administered 2023-09-30: 1000 mg via ORAL
  Filled 2023-09-30: qty 2

## 2023-09-30 MED ORDER — ENOXAPARIN SODIUM 30 MG/0.3ML IJ SOSY
30.0000 mg | PREFILLED_SYRINGE | INTRAMUSCULAR | Status: DC
  Administered 2023-09-30 – 2023-10-05 (×6): 30 mg via SUBCUTANEOUS
  Filled 2023-09-30 (×6): qty 0.3

## 2023-09-30 MED ORDER — LACTATED RINGERS IV BOLUS (SEPSIS)
1000.0000 mL | Freq: Once | INTRAVENOUS | Status: AC
  Administered 2023-09-30: 1000 mL via INTRAVENOUS

## 2023-09-30 MED ORDER — ENOXAPARIN SODIUM 40 MG/0.4ML IJ SOSY: 40.0000 mg | PREFILLED_SYRINGE | INTRAMUSCULAR | Status: DC

## 2023-09-30 MED ORDER — ACETAMINOPHEN 650 MG RE SUPP: 650.0000 mg | Freq: Four times a day (QID) | RECTAL | Status: DC | PRN

## 2023-09-30 MED ORDER — VANCOMYCIN VARIABLE DOSE PER UNSTABLE RENAL FUNCTION (PHARMACIST DOSING)
Status: DC
  Filled 2023-09-30: qty 1

## 2023-09-30 MED ORDER — LACTATED RINGERS IV SOLN
150.0000 mL/h | INTRAVENOUS | Status: AC
Start: 2023-09-30 — End: 2023-10-01
  Administered 2023-09-30 (×3): 150 mL/h via INTRAVENOUS

## 2023-09-30 MED ORDER — ASPIRIN 81 MG PO CHEW
324.0000 mg | CHEWABLE_TABLET | Freq: Once | ORAL | Status: AC
  Administered 2023-09-30: 324 mg via ORAL
  Filled 2023-09-30: qty 4

## 2023-09-30 MED ORDER — ALBUTEROL SULFATE (2.5 MG/3ML) 0.083% IN NEBU: 2.5000 mg | INHALATION_SOLUTION | RESPIRATORY_TRACT | Status: AC | PRN

## 2023-09-30 MED ORDER — SODIUM CHLORIDE 0.9 % IV SOLN
2.0000 g | Freq: Once | INTRAVENOUS | Status: AC
  Administered 2023-09-30: 2 g via INTRAVENOUS
  Filled 2023-09-30: qty 12.5

## 2023-09-30 MED ORDER — ONDANSETRON HCL 4 MG PO TABS: 4.0000 mg | ORAL_TABLET | Freq: Four times a day (QID) | ORAL | Status: DC | PRN

## 2023-09-30 MED ORDER — VANCOMYCIN HCL IN DEXTROSE 1-5 GM/200ML-% IV SOLN
1000.0000 mg | Freq: Once | INTRAVENOUS | Status: AC
  Administered 2023-09-30: 1000 mg via INTRAVENOUS
  Filled 2023-09-30: qty 200

## 2023-09-30 MED ORDER — HYDROMORPHONE HCL 1 MG/ML IJ SOLN: 0.5000 mg | INTRAMUSCULAR | Status: AC | PRN

## 2023-09-30 MED ORDER — ONDANSETRON HCL 4 MG/2ML IJ SOLN: 4.0000 mg | Freq: Three times a day (TID) | INTRAMUSCULAR | Status: DC | PRN

## 2023-09-30 NOTE — Assessment & Plan Note (Signed)
 Positive generalized lethargy in setting of active sepsis with recurrent multifocal pneumonia and UTI CT head within normal  Check ammonia level Monitor

## 2023-09-30 NOTE — ED Notes (Signed)
 Lab called reporting critical troponin of 229. Kristen Ward, DO notified.

## 2023-09-30 NOTE — ED Triage Notes (Addendum)
 Pt arrives via [facility] from home for c/o generalized weakness for a couple of days. Reports decreased po intake. Had suprapubic catheter placed x 4 weeks ago. States, "They placed it because I had blood in my urine." Temp: 100.7 F oral en route with EMS. Denies N/V/D. Reported to have fallen in garage yesterday but refused to have EMS called. Reports PMH: HTN; hematuria. Arrived with an 18 G IV in LFA and RFA. Receiving NS en route. Received 250 mL total PTA. Also reports increased shortness of breath with standing. A&Ox4 at this time.

## 2023-09-30 NOTE — Assessment & Plan Note (Addendum)
 Troponin in the 200s on presentation in setting of sepsis associated with multifocal pneumonia and UTI No active chest pain Suspect secondary demand ischemia Stage IV CKD and poor renal clearance also confounding issues Will trend troponin Defer ASA in setting of transient hematuria  Monitor

## 2023-09-30 NOTE — Assessment & Plan Note (Signed)
 Hold BP regimen in setting hypotension assd with sepsis  Monitor

## 2023-09-30 NOTE — Assessment & Plan Note (Signed)
 Creatinine 2.4 on presentation with GFR in the 20s Appears to be near baseline Monitor

## 2023-09-30 NOTE — Progress Notes (Signed)
 CODE SEPSIS - PHARMACY COMMUNICATION  **Broad Spectrum Antibiotics should be administered within 1 hour of Sepsis diagnosis**  Time Code Sepsis Called/Page Received: 0981  Antibiotics Ordered: Cefepime, Vancomycin, Flagyl  Time of 1st antibiotic administration: 0536  Coretta Dexter, PharmD, MBA 09/30/2023 5:00 AM

## 2023-09-30 NOTE — Sepsis Progress Note (Signed)
 Elink following for sepsis protocol.

## 2023-09-30 NOTE — Consult Note (Signed)
 Pharmacy Antibiotic Note  Zachary [name] is a 83 y.o. male admitted on 09/30/2023 with sepsis.  Patient has history of ESBL UTI from 08/2023. Pharmacy has been consulted for vancomycin and meropenem dosing.  AKI with Scr 2.41 (Scr 1.64 08/26/23)  Vancomycin 1 grams IV x 1 given 4/29 @ 0608  Plan: Will use variable vancomycin dosing due to AKI Will order vancomycin random for 4/30 with AM labs to guide dosing Meropenem 500 mg IV every 12 hours Follow renal function and cultures for adjustments  Height: 6\' 2"  (188 cm) Weight: 56.7 kg (125 lb) IBW/kg (Calculated) : 82.2  Temp (24hrs), Avg:98.1 F (36.7 C), Min:98.1 F (36.7 C), Max:98.1 F (36.7 C)  Recent Labs  Lab 09/26/23 1300 09/30/23 0506  WBC 11.4* 14.8*  CREATININE  --  2.41*  LATICACIDVEN  --  1.4    Estimated Creatinine Clearance: 19.6 mL/min (A) (by C-G formula based on SCr of 2.41 mg/dL (H)).    No Known Allergies  Antimicrobials this admission: vancomycin 4/29 >>  Meropenem 4/29 >>  Cefepime x 1 4/29 Flagyl x 1 4/29  Microbiology results: 4/29 BCx: pending 4/29 UCx: pending  4/29 MRSA PCR: ordered  Thank you for allowing pharmacy to be a part of this patient's care.  Ramonita Burow, PharmD 09/30/2023 7:29 AM

## 2023-09-30 NOTE — ED Notes (Signed)
 Urine sample obtained from leg bag. Sent to lab. Urine orange, with sediment, and strong-foul odor. Leg bag that pt arrived with had long tubing wrapped in coban and looped multiple times. After removing coban and upon inspection, pt had abrasion formed on right thigh beneath coban. No bleeding noted at this time. Kristen Ward, DO notified and showed at stretcher side. This nurse verified with Verneda Golder, DO if pt's leg bag could be replaced with one from here in the ED. Kristen Ward, DO verified. Pt leg bag replaced by this nurse. Pt tolerated well.

## 2023-09-30 NOTE — Consult Note (Signed)
 NAME:  Zachary [name], MRN:  [id number], DOB:  [birth year], LOS: 0 ADMISSION DATE:  09/30/2023, CONSULTATION DATE:  09/30/2023 REFERRING MD:  Pablo Boards, MD, CHIEF COMPLAINT:  Severe Sepsis   History of Present Illness:   Patient is an 83 year old male with a past medical history of urinary retention status post suprapubic catheter placement coming in for a few days history of weakness.  Patient tells us  that he has been unable to do anything at home secondary to weakness and overall feeling unwell.  He has been unable to go to the bathroom or walk around his house.  He has not had any fevers, chills, night sweats, abdominal pain, cough, or shortness of breath.  Due to the weakness and overall failure to thrive, his wife called EMS and he was brought in for an evaluation.  On presentation to the hospital, he had a fever of up to 100.7 F.  He was noted to have an AKI as well as leukocytosis with a urinalysis that is consistent with a UTI. Troponin is mildly elevated, and lactic acid was normal at 1.4. He was started on IV fluids and blood cultures were drawn. Broad-spectrum antibiotics were initiated and he was admitted to the medicine service.  Given soft blood pressures, PCCM is consulted for evaluation of sepsis.  Medical chart reviewed, he was admitted to the hospital early March 2025 for obstructive uropathy and AKI.  He was then seen by urology in clinic in March 2025 for urinary retention with plan for suprapubic catheter later that month.  He had a urine culture that returned positive for E. coli ESBL on 08/13/2023 in the ED.  Pertinent  Medical History  -CAD -HTN -HLD  Significant Hospital Events: Including procedures, antibiotic start and stop dates in addition to other pertinent events   4/29: admission, fluids and IV antibiotics administered  Objective   Blood pressure (!) 94/59, pulse 62, temperature 98.1 F (36.7 C), temperature source Oral, resp. rate 14, height 6\' 2"   (1.88 m), weight 56.7 kg, SpO2 100%.        Intake/Output Summary (Last 24 hours) at 09/30/2023 0850 Last data filed at 09/30/2023 0615 Gross per 24 hour  Intake 1099.38 ml  Output --  Net 1099.38 ml   Filed Weights   09/30/23 0457  Weight: 56.7 kg    Examination: Physical Exam Constitutional:      General: He is not in acute distress.    Appearance: He is ill-appearing.  Cardiovascular:     Rate and Rhythm: Normal rate and regular rhythm.     Pulses: Normal pulses.     Heart sounds: Normal heart sounds.  Pulmonary:     Effort: Pulmonary effort is normal.     Breath sounds: Normal breath sounds.  Abdominal:     General: There is no distension.     Palpations: Abdomen is soft. There is no mass.     Tenderness: There is no abdominal tenderness. There is no guarding.     Comments: Suprapubic catheter in place, site is clean and does not look infected. No purulence noted.  Musculoskeletal:     Right lower leg: No edema.     Left lower leg: No edema.  Neurological:     General: No focal deficit present.     Mental Status: He is alert. Mental status is at baseline.     Motor: Weakness present.      Assessment & Plan:   #Severe Sepsis #Complicated UTI #AKI #non-ischemic  myocardial injury #Urinary Retention s/p supra-pubic catheter placement #History of ESBL E. Coli UTI #Toxic Metabolic Encephalopathy #Paraesophageal Hiatal Hernia  Neuro - encephalopathy due to sepsis, hold psychotropic medications as able, tylenol  for pain recommended over hydromorphone. VBG checked, mild metabolic acidosis, no hypercarbia. CT head negative for any acute changes. CV - sepsis secondary to UTI, with improvement in blood pressure after fluid bolus. Lactic acid normal, no signs of hypoperfusion. He also has mild elevation in troponin in the setting of sepsis resulting in non-ischemic myocardial injury given no EKG changes and no chest pain. Reviewed EKG on admission and compared to prior,  no sign of NSTEMI. Patient followed by cardiology outpatient [facility]), consider consultation as necessary. Pulm - respiratory status at baseline GI - history of paraesophageal hiatal hernia on previous imaging. Monitor. Renal - AKI on CKD in the setting of sepsis. On IV fluids, checked VBG, no significant metabolic acidosis to warrant switching fluids to sodium bicarbonate  Endo - monitor glucose, avoid hypoglycemia Hem/Onc - DVT prophylaxis per primary team, currently on enoxaparin ID - ESBL E. Coli on prior urine cultures, likely causative organism behind current UTI and sepsis. Lactic acid normal, no sign of shock. Continue with broad spectrum antibiotics with meropenem pending blood and urinary culture. Can de-escalate vancomycin quickly.  Best Practice (right click and "Reselect all SmartList Selections" daily)   Diet/type: Regular consistency (see orders) DVT prophylaxis LMWH Pressure ulcer(s): N/A GI prophylaxis: N/A Lines: N/A Foley:  Yes, and it is still needed Code Status:  DNR Last date of multidisciplinary goals of care discussion [09/30/2023]  Labs   CBC: Recent Labs  Lab 09/26/23 1300 09/30/23 0506  WBC 11.4* 14.8*  NEUTROABS 9.8* 12.8*  HGB 10.8* 9.2*  HCT 32.0* 27.4*  MCV 98.2 99.6  PLT 300 215    Basic Metabolic Panel: Recent Labs  Lab 09/30/23 0506  NA 129*  K 4.2  CL 98  CO2 20*  GLUCOSE 135*  BUN 67*  CREATININE 2.41*  CALCIUM  8.7*  MG 2.1   GFR: Estimated Creatinine Clearance: 19.6 mL/min (A) (by C-G formula based on SCr of 2.41 mg/dL (H)). Recent Labs  Lab 09/26/23 1300 09/30/23 0506  WBC 11.4* 14.8*  LATICACIDVEN  --  1.4    Liver Function Tests: Recent Labs  Lab 09/30/23 0506  AST 21  ALT 20  ALKPHOS 76  BILITOT 1.1  PROT 5.8*  ALBUMIN 2.4*   No results for input(s): "LIPASE", "AMYLASE" in the last 168 hours. No results for input(s): "AMMONIA" in the last 168 hours.  ABG No results found for: "PHART", "PCO2ART", "PO2ART",  "HCO3", "TCO2", "ACIDBASEDEF", "O2SAT"   Coagulation Profile: Recent Labs  Lab 09/30/23 0506  INR 1.2    Cardiac Enzymes: No results for input(s): "CKTOTAL", "CKMB", "CKMBINDEX", "TROPONINI" in the last 168 hours.  HbA1C: Hgb A1c MFr Bld  Date/Time Value Ref Range Status  05/19/2015 09:06 AM 5.2 4.0 - 6.0 % Final    CBG: No results for input(s): "GLUCAP" in the last 168 hours.  Review of Systems:   Review of Systems  Constitutional:  Positive for malaise/fatigue and weight loss. Negative for chills and fever.  Respiratory:  Negative for cough, hemoptysis and shortness of breath.   Cardiovascular:  Negative for chest pain, palpitations, orthopnea and leg swelling.  Genitourinary:  Negative for dysuria and flank pain.  Neurological:  Positive for dizziness.     Past Medical History:  He,  has a past medical history of B12 deficiency anemia (08/03/2019), Hypercholesterolemia,  Hypertension, and Vertigo.   Surgical History:   Past Surgical History:  Procedure Laterality Date   APPENDECTOMY     CATARACT EXTRACTION Bilateral    COLONOSCOPY WITH PROPOFOL  N/A 12/24/2018   Procedure: COLONOSCOPY WITH PROPOFOL ;  Surgeon: Marnee Sink, MD;  Location: [facility];  Service: Endoscopy;  Laterality: N/A;   COLONOSCOPY WITH PROPOFOL  N/A 12/13/2019   Procedure: COLONOSCOPY WITH BIOPSY ;  Surgeon: Marnee Sink, MD;  Location: [facility];  Service: Endoscopy;  Laterality: N/A;  priority 3   IR CYSTOSTOMY TUBE PLACEMENT/BLADDER ASPIRATION  08/26/2023   PLEURAL SCARIFICATION  05/2015   POLYPECTOMY N/A 12/13/2019   Procedure: POLYPECTOMY;  Surgeon: Marnee Sink, MD;  Location: [facility];  Service: Endoscopy;  Laterality: N/A;     Social History:   reports that he has never smoked. He has never used smokeless tobacco. He reports that he does not drink alcohol  and does not use drugs.   Family History:  His family history includes ALS in his brother; CVA in his father;  Heart attack in his father; Hyperlipidemia in his mother; Hypertension in his father and mother; Prostate cancer in his brother.   Allergies No Known Allergies   Home Medications  Prior to Admission medications   Medication Sig Start Date End Date Taking? Authorizing Provider  acetaminophen  (TYLENOL ) 325 MG tablet Take 2 tablets (650 mg total) by mouth every 6 (six) hours as needed for mild pain (pain score 1-3), headache or fever (or Fever >/= 101). 08/08/23  Yes Alexander, Natalie, DO  atorvastatin  (LIPITOR) 10 MG tablet Take 10 mg by mouth daily. 08/13/23  Yes [provider]  calcium  carbonate (OS-CAL - DOSED IN MG OF ELEMENTAL CALCIUM ) 1250 (500 Ca) MG tablet Take 1 tablet (1,250 mg total) by mouth 2 (two) times daily with a meal. 08/08/23  Yes Melodi Sprung, DO  Multiple Vitamin (MULTIVITAMIN WITH MINERALS) TABS tablet Take 1 tablet by mouth daily. 08/09/23  Yes Alexander, Natalie, DO  triamterene -hydrochlorothiazide (DYAZIDE) 37.5-25 MG capsule Take 1 capsule by mouth daily. 09/25/23  Yes [provider]  aspirin  EC 81 MG tablet Take 81 mg by mouth at bedtime. Pt states he has resumed as of 09/26/23    [provider]     Critical care time: 58 minutes    Vergia Glasgow, MD [facility] 09/30/2023 9:16 AM

## 2023-09-30 NOTE — ED Provider Notes (Addendum)
 [facility] Provider Note    Event Date/Time   First MD Initiated Contact with Patient 09/30/23 0448     (approximate)   History   Weakness and Fever   HPI  Zachary [name] is a 83 y.o. male with history of hypertension, hyperlipidemia, anemia, chronic urinary retention status post recent suprapubic catheter placement who presents to the emergency department as a code sepsis.  Patient reports he has felt very weak over the past 2 days and has had 2 falls.  He denies any pain.  No chest pain but has felt short of breath with standing and walking.  No vomiting or diarrhea.  Had a suprapubic catheter placed 4 weeks ago.  Has not seen any blood in his urine.  Denies bloody stools or melena.  Not on blood thinners.  Lives at home with his wife.  Patient reports he is a DNR/DNI.  Temperature with EMS was 100.7.  Systolic blood pressures in the 90s.   History provided by patient, EMS.    Past Medical History:  Diagnosis Date   B12 deficiency anemia 08/03/2019   Hypercholesterolemia    Hypertension    Vertigo    last episode approx 09/2018    Past Surgical History:  Procedure Laterality Date   APPENDECTOMY     CATARACT EXTRACTION Bilateral    COLONOSCOPY WITH PROPOFOL  N/A 12/24/2018   Procedure: COLONOSCOPY WITH PROPOFOL ;  Surgeon: Marnee Sink, MD;  Location: [facility] END[facility];  Service: Endoscopy;  Laterality: N/A;   COLONOSCOPY WITH PROPOFOL  N/A 12/13/2019   Procedure: COLONOSCOPY WITH BIOPSY ;  Surgeon: Marnee Sink, MD;  Location: [facility];  Service: Endoscopy;  Laterality: N/A;  priority 3   IR CYSTOSTOMY TUBE PLACEMENT/BLADDER ASPIRATION  08/26/2023   PLEURAL SCARIFICATION  05/2015   POLYPECTOMY N/A 12/13/2019   Procedure: POLYPECTOMY;  Surgeon: Marnee Sink, MD;  Location: [facility];  Service: Endoscopy;  Laterality: N/A;    MEDICATIONS:  Prior to Admission medications   Medication Sig Start Date End Date Taking? Authorizing  Provider  acetaminophen  (TYLENOL ) 325 MG tablet Take 2 tablets (650 mg total) by mouth every 6 (six) hours as needed for mild pain (pain score 1-3), headache or fever (or Fever >/= 101). 08/08/23   Alexander, Natalie, DO  aspirin  EC 81 MG tablet Take 81 mg by mouth at bedtime. Pt states he has resumed as of 09/26/23    [provider]  atorvastatin  (LIPITOR) 10 MG tablet Take 10 mg by mouth daily. 08/13/23   [provider]  calcium  carbonate (OS-CAL - DOSED IN MG OF ELEMENTAL CALCIUM ) 1250 (500 Ca) MG tablet Take 1 tablet (1,250 mg total) by mouth 2 (two) times daily with a meal. 08/08/23   Melodi Sprung, DO  Multiple Vitamin (MULTIVITAMIN WITH MINERALS) TABS tablet Take 1 tablet by mouth daily. 08/09/23   Alexander, Natalie, DO  triamterene -hydrochlorothiazide (DYAZIDE) 37.5-25 MG capsule Take 1 capsule by mouth daily. Patient not taking: Reported on 09/26/2023 09/25/23   [provider]    Physical Exam   Triage Vital Signs: ED Triage Vitals  Encounter Vitals Group     BP 09/30/23 0456 97/63     Systolic BP Percentile --      Diastolic BP Percentile --      Pulse Rate 09/30/23 0456 79     Resp 09/30/23 0456 (!) 26     Temp --      Temp src --      SpO2 09/30/23  0456 100 %     Weight 09/30/23 0457 125 lb (56.7 kg)     Height 09/30/23 0457 6\' 2"  (1.88 m)     Head Circumference --      Peak Flow --      Pain Score 09/30/23 0457 0     Pain Loc --      Pain Education --      Exclude from Growth Chart --     Most recent vital signs: Vitals:   09/30/23 0530 09/30/23 0600  BP: 99/66 93/71  Pulse: 84   Resp: (!) 28 (!) 26  Temp:    SpO2: 100%     CONSTITUTIONAL: Alert, responds appropriately to questions.  Elderly, thin HEAD: Normocephalic, atraumatic EYES: Conjunctivae clear, pupils appear equal, sclera nonicteric, conjunctival pallor noted ENT: normal nose; moist mucous membranes NECK: Supple, normal ROM CARD: RRR; S1 and S2 appreciated RESP: Patient  is slightly tachypneic, breath sounds clear and equal bilaterally; no wheezes, no rhonchi, no rales, no hypoxia or respiratory distress, speaking full sentences ABD/GI: Non-distended; soft, non-tender, no rebound, no guarding, no peritoneal signs, suprapubic catheter in place without surrounding redness, warmth or drainage BACK: The back appears normal EXT: Normal ROM in all joints; no deformity noted, no edema SKIN: Normal color for age and race; warm; no rash on exposed skin NEURO: Moves all extremities equally, normal speech PSYCH: The patient's mood and manner are appropriate.   ED Results / Procedures / Treatments   LABS: (all labs ordered are listed, but only abnormal results are displayed) Labs Reviewed  COMPREHENSIVE METABOLIC PANEL WITH GFR - Abnormal; Notable for the following components:      Result Value   Sodium 129 (*)    CO2 20 (*)    Glucose, Bld 135 (*)    BUN 67 (*)    Creatinine, Ser 2.41 (*)    Calcium  8.7 (*)    Total Protein 5.8 (*)    Albumin 2.4 (*)    GFR, Estimated 26 (*)    All other components within normal limits  CBC WITH DIFFERENTIAL/PLATELET - Abnormal; Notable for the following components:   WBC 14.8 (*)    RBC 2.75 (*)    Hemoglobin 9.2 (*)    HCT 27.4 (*)    Neutro Abs 12.8 (*)    Lymphs Abs 0.3 (*)    Abs Immature Granulocytes 0.77 (*)    All other components within normal limits  PROTIME-INR - Abnormal; Notable for the following components:   Prothrombin Time 15.5 (*)    All other components within normal limits  URINALYSIS, W/ REFLEX TO CULTURE (INFECTION SUSPECTED) - Abnormal; Notable for the following components:   Color, Urine YELLOW (*)    APPearance CLOUDY (*)    Hgb urine dipstick SMALL (*)    Protein, ur 30 (*)    Leukocytes,Ua LARGE (*)    Bacteria, UA MANY (*)    All other components within normal limits  TROPONIN I (HIGH SENSITIVITY) - Abnormal; Notable for the following components:   Troponin I (High Sensitivity) 229 (*)     All other components within normal limits  RESP PANEL BY RT-PCR (RSV, FLU A&B, COVID)  RVPGX2  CULTURE, BLOOD (ROUTINE X 2)  CULTURE, BLOOD (ROUTINE X 2)  URINE CULTURE  LACTIC ACID, PLASMA  MAGNESIUM  PROCALCITONIN  TYPE AND SCREEN  TROPONIN I (HIGH SENSITIVITY)     EKG:  EKG Interpretation Date/Time:  Tuesday September 30 2023 05:09:16 EDT Ventricular Rate:  92 PR Interval:  97 QRS Duration:  129 QT Interval:  367 QTC Calculation: 454 R Axis:   89  Text Interpretation: Sinus rhythm Multiform ventricular premature complexes Short PR interval Nonspecific intraventricular conduction delay Nonspecific T abnormalities, lateral leads ST elevation, consider inferior injury Confirmed by Verneda Golder [phone]) on 09/30/2023 5:11:11 AM         RADIOLOGY: My personal review and interpretation of imaging: CT head and cervical spine show no traumatic injury.  Chest x-ray shows right upper and lower lobe pneumonia.  I have personally reviewed all radiology reports.   DG Chest Port 1 View Result Date: 09/30/2023 CLINICAL DATA:  83 year old male with possible sepsis. EXAM: PORTABLE CHEST 1 VIEW COMPARISON:  Chest CT 03/04/2022 and earlier. FINDINGS: Portable AP supine views at 0600 hours. Chronic retrocardiac moderate to large gastric hiatal hernia. Stable cardiac size and mediastinal contours. Chronic pulmonary hyperinflation. Patchy and indistinct new multifocal right lung opacity in both the upper and lower lung. No superimposed pneumothorax, pulmonary edema, pleural effusion. No definite left lung involvement. No acute osseous abnormality identified. Nondilated gas-filled bowel in the upper abdomen. IMPRESSION: 1. Chronic pulmonary hyperinflation with indistinct new multifocal right lung opacity suspicious for developing multilobar bronchopneumonia/pneumonia in this setting. No pleural effusion. 2. Chronic moderate to large hiatal hernia. Electronically Signed   By: Marlise Simpers M.D.   On:  09/30/2023 06:25   CT Cervical Spine Wo Contrast Result Date: 09/30/2023 CLINICAL DATA:  83 year old male status post fall in garage ester day. Generalized weakness, decreased p.o. Hematuria. Shortness of breath. EXAM: CT CERVICAL SPINE WITHOUT CONTRAST TECHNIQUE: Multidetector CT imaging of the cervical spine was performed without intravenous contrast. Multiplanar CT image reconstructions were also generated. RADIATION DOSE REDUCTION: This exam was performed according to the departmental dose-optimization program which includes automated exposure control, adjustment of the mA and/or kV according to patient size and/or use of iterative reconstruction technique. COMPARISON:  Head CT today. FINDINGS: Alignment: Maintained cervical lordosis. Cervicothoracic junction alignment is within normal limits. Bilateral posterior element alignment is within normal limits. Skull base and vertebrae: Bone mineralization is within normal limits for age. Visualized skull base is intact. No atlanto-occipital dissociation. C1 and C2 appear intact and aligned. No acute osseous abnormality identified. Soft tissues and spinal canal: No prevertebral fluid or swelling. No visible canal hematoma. Bulky calcified carotid atherosclerosis in the neck, severe on the right (series 3, image 58). Disc levels: Generally mild for age cervical spine degeneration and capacious CT appearance of the cervical spinal canal at most levels. Upper chest: Visible upper thoracic levels appear intact. Clear lung apices with questionable emphysema. Trace intravenous gas at the thoracic inlet, likely IV access related. IMPRESSION: 1. No acute traumatic injury identified in the cervical spine. Mild for age cervical spine degeneration. 2. Calcified carotid atherosclerosis in the neck, Severe at the Right ICA. Electronically Signed   By: Marlise Simpers M.D.   On: 09/30/2023 05:57   CT HEAD WO CONTRAST ( ) Result Date: 09/30/2023 CLINICAL DATA:  83 year old male  status post fall in garage ester day. Generalized weakness, decreased p.o. Hematuria. Shortness of breath. EXAM: CT HEAD WITHOUT CONTRAST TECHNIQUE: Contiguous axial images were obtained from the base of the skull through the vertex without intravenous contrast. RADIATION DOSE REDUCTION: This exam was performed according to the departmental dose-optimization program which includes automated exposure control, adjustment of the mA and/or kV according to patient size and/or use of iterative reconstruction technique. COMPARISON:  Brain MRI 04/12/2020.  Head CT 01/20/2017.  FINDINGS: Brain: Cerebral volume stable and within normal limits for age. Choroid plexus cysts, normal variant. No midline shift, ventriculomegaly, mass effect, evidence of mass lesion, intracranial hemorrhage or evidence of cortically based acute infarction. Gray-white differentiation stable and within normal limits for age. Vascular: Advanced calcified atherosclerosis at the skull base. No suspicious intracranial vascular hyperdensity. Skull: Stable, intact. Sinuses/Orbits: Visualized paranasal sinuses and mastoids are clear. Other: No acute orbit or scalp soft tissue injury identified. Stable postoperative changes to the globes. IMPRESSION: 1. No acute intracranial abnormality or acute traumatic injury identified. 2. Stable and negative for age noncontrast CT appearance of the brain. Electronically Signed   By: Marlise Simpers M.D.   On: 09/30/2023 05:55     PROCEDURES:  Critical Care performed: Yes, see critical care procedure note(s)   CRITICAL CARE Performed by: Starling Eck Link Burgeson   Total critical care time: 40 minutes  Critical care time was exclusive of separately billable procedures and treating other patients.  Critical care was necessary to treat or prevent imminent or life-threatening deterioration.  Critical care was time spent personally by me on the following activities: development of treatment plan with patient and/or surrogate as  well as nursing, discussions with consultants, evaluation of patient's response to treatment, examination of patient, obtaining history from patient or surrogate, ordering and performing treatments and interventions, ordering and review of laboratory studies, ordering and review of radiographic studies, pulse oximetry and re-evaluation of patient's condition.   [name]1-3 Lead EKG Interpretation  Performed by: Ladean Steinmeyer, Clover Dao, DO Authorized by: Mazy Culton, Clover Dao, DO     Interpretation: normal     ECG rate:  79   ECG rate assessment: normal     Rhythm: sinus rhythm     Ectopy: none     Conduction: normal       IMPRESSION / MDM / ASSESSMENT AND PLAN / ED COURSE  I reviewed the triage vital signs and the nursing notes.    Patient here as a code sepsis.  Fever with EMS 100.7.  Hypotensive, tachypneic.  The patient is on the cardiac monitor to evaluate for evidence of arrhythmia and/or significant heart rate changes.   DIFFERENTIAL DIAGNOSIS (includes but not limited to):   Sepsis, UTI, bacteremia, pneumonia, anemia, electrolyte derangement, dehydration, ACS, arrhythmia, PE, intracranial hemorrhage, cervical spine fracture   Patient's presentation is most consistent with acute presentation with potential threat to life or bodily function.   PLAN: Will obtain labs, urine, cultures, chest x-ray, COVID and flu swab.  Will give IV fluids and broad-spectrum antibiotics.  Patient will need admission.  He is a DNR/DNI.   MEDICATIONS GIVEN IN ED: Medications  lactated ringers  infusion (has no administration in time range)  metroNIDAZOLE (FLAGYL) IVPB 500 mg (has no administration in time range)  vancomycin (VANCOCIN) IVPB 1000 mg/200 mL premix (1,000 mg Intravenous New Bag/Given 09/30/23 0608)  ondansetron  (ZOFRAN ) injection 4 mg (has no administration in time range)  HYDROmorphone (DILAUDID) injection 0.5 mg (has no administration in time range)  albuterol  (PROVENTIL ) (2.5 MG/3ML) 0.083%  nebulizer solution 2.5 mg (has no administration in time range)  lactated ringers  bolus 1,000 mL (1,000 mLs Intravenous New Bag/Given 09/30/23 0513)    And  lactated ringers  bolus 1,000 mL (1,000 mLs Intravenous New Bag/Given 09/30/23 0539)  ceFEPIme (MAXIPIME) 2 g in sodium chloride  0.9 % 100 mL IVPB (0 g Intravenous Stopped 09/30/23 0608)  acetaminophen  (TYLENOL ) tablet 1,000 mg (1,000 mg Oral Given 09/30/23 0514)  aspirin  chewable tablet 324 mg (324 mg  Oral Given 09/30/23 0618)     ED COURSE: CT head and cervical spine reviewed and interpreted by myself and the radiologist and show no acute abnormality.  Patient does have a leukocytosis of 14,000.  He is anemic which is his baseline.  Lactic normal.  Urine is grossly infected.  Getting antibiotics, fluids.  Troponin elevated.  He denies any chest pain, shortness of breath currently.  Likely from demand ischemia.  EKGs show nonspecific changes, increased ectopy.  Normal electrolytes.  Will give full dose aspirin .  Chest x-ray reviewed and interpreted by myself and the radiologist and shows right upper and lower lobe pneumonia.  COVID, flu and RSV negative.  No hypoxia.  Urine appears grossly infected with greater than 50,000 white blood cells, many bacteria.  Culture pending.  CONSULTS:  Consulted and discussed patient's case with hospitalist, Dr, Vallarie Gauze.  I have recommended admission and consulting physician agrees and will place admission orders.  Patient (and family if present) agree with this plan.   I reviewed all nursing notes, vitals, pertinent previous records.  All labs, EKGs, imaging ordered have been independently reviewed and interpreted by myself.    OUTSIDE RECORDS REVIEWED: Reviewed last hematology/oncology and urology notes.       FINAL CLINICAL IMPRESSION(S) / ED DIAGNOSES   Final diagnoses:  Acute sepsis (HCC)  Elevated troponin  Acute UTI  Pneumonia of right upper lobe due to infectious organism  Pneumonia of  right lower lobe due to infectious organism     Rx / DC Orders   ED Discharge Orders     None        Note:  This document was prepared using Dragon voice recognition software and may include unintentional dictation errors.   Jetson Pickrel, Clover Dao, DO 09/30/23 0631    Tarsha Blando, Clover Dao, DO 09/30/23 [phone]

## 2023-09-30 NOTE — Assessment & Plan Note (Addendum)
 Urinary retention s/p SP cath  History of ESBL E coli infection  Urinary retention s/p suprapubic catheter placement  UA indicative of infection with presentation of sepsis on arrival  Noted 08/2023 Urine culture showing ESBL E coli  Will place on meropenem for infectious coverage pending urine culture  Suprapubic catheter exchanged in the ER  Follow

## 2023-09-30 NOTE — Assessment & Plan Note (Signed)
 Cont lipitor

## 2023-09-30 NOTE — Assessment & Plan Note (Signed)
 Multilobar pneumonia on presentation in the setting of sepsis  No hypoxia at present  Will place on IV meropenem and vancomycin in setting of concurrent UTI with history of ESBL E coli infection  Blood and resp cultures  Incentive spirometry  Follow closely

## 2023-09-30 NOTE — Progress Notes (Signed)
 PHARMACIST - PHYSICIAN COMMUNICATION  CONCERNING:  Enoxaparin (Lovenox) for DVT Prophylaxis    RECOMMENDATION: Patient was prescribed enoxaprin 40mg  q24 hours for VTE prophylaxis.   Filed Weights   09/30/23 0457  Weight: 56.7 kg (125 lb)    Body mass index is 16.05 kg/m.  Estimated Creatinine Clearance: 19.6 mL/min (A) (by C-G formula based on SCr of 2.41 mg/dL (H)).   Patient is candidate for enoxaparin 30mg  every 24 hours based on CrCl <6ml/min or Weight <45kg  DESCRIPTION: Pharmacy has adjusted enoxaparin dose per [facility] policy.  Patient is now receiving enoxaparin 30 mg every 24 hours    Janette Medley, PharmD Clinical Pharmacist  09/30/2023 7:24 AM

## 2023-09-30 NOTE — Assessment & Plan Note (Signed)
 Meeting sepsis criteria based on  RR 20s, SBP in 80s and WBC 14  Noted concern for multilobar pneumonia on CXR UA also indicative of infection on urine culture in setting of chronic indwelling foley  Prior history of ESBL E coli is a confounding issue  Lactate 1.4  Will place on meropenem and vancomycin for broad spectrum coverage  Panculture  Continue with aggressive LR IVF resuscitation Monitor

## 2023-09-30 NOTE — H&P (Addendum)
 History and Physical    Patient: Zachary [name]:[id number] DOB: Jan 24, 1941 DOA: 09/30/2023 DOS: the patient was seen and examined on 09/30/2023 PCP: [name], MD  Patient coming from: Home  Chief Complaint:  Chief Complaint  Patient presents with   Weakness   Fever   HPI: Zachary [name] is a 83 y.o. male with medical history significant of HTN, urinary retention s/p SP catheter placement,  HLD presenting with sepsis, pna, uti, encephalopathy. Limited history in setting of encephalopathy.  Per report, patient with weakness at home for multiple days.  Noted admission March 2025 for issues including urinary retention and bladder outlet obstruction had SP catheter placed roughly 4 weeks ago.  Per report, patient with recurrent blood in the urine since placement.  Had Tmax 100.7 on route to the ER.  Positive fall at home yesterday.  Patient refused to go to the EMS.  At present, there are no reports of chest pain, shortness of breath.  Positive generalized weakness.  No reported abdominal pain.  No focal hemiparesis or confusion.  Of note, during March 2025 admission, urine culture positive for ESBL E. coli. Presented to the ER afebrile, heart rate 50s to 80s, respirations mid 20s.  Blood pressure 70s to 90s.  Satting well on room air.  White count 14.8, hemoglobin 9.2, platelets 215, troponin 229-->204.  Urinalysis indicative of infection.  COVID flu and RSV negative.  Creatinine 2.41. Review of Systems: As mentioned in the history of present illness. All other systems reviewed and are negative. Past Medical History:  Diagnosis Date   B12 deficiency anemia 08/03/2019   Hypercholesterolemia    Hypertension    Vertigo    last episode approx 09/2018   Past Surgical History:  Procedure Laterality Date   APPENDECTOMY     CATARACT EXTRACTION Bilateral    COLONOSCOPY WITH PROPOFOL  N/A 12/24/2018   Procedure: COLONOSCOPY WITH PROPOFOL ;  Surgeon: Marnee Sink, MD;  Location: [facility]  ENDOSCOPY;  Service: Endoscopy;  Laterality: N/A;   COLONOSCOPY WITH PROPOFOL  N/A 12/13/2019   Procedure: COLONOSCOPY WITH BIOPSY ;  Surgeon: Marnee Sink, MD;  Location: [facility];  Service: Endoscopy;  Laterality: N/A;  priority 3   IR CYSTOSTOMY TUBE PLACEMENT/BLADDER ASPIRATION  08/26/2023   PLEURAL SCARIFICATION  05/2015   POLYPECTOMY N/A 12/13/2019   Procedure: POLYPECTOMY;  Surgeon: Marnee Sink, MD;  Location: [facility];  Service: Endoscopy;  Laterality: N/A;   Social History:  reports that he has never smoked. He has never used smokeless tobacco. He reports that he does not drink alcohol  and does not use drugs.  No Known Allergies  Family History  Problem Relation Age of Onset   Hypertension Mother    Hyperlipidemia Mother    Heart attack Father    Hypertension Father    CVA Father    ALS Brother    Prostate cancer Brother     Prior to Admission medications   Medication Sig Start Date End Date Taking? Authorizing Provider  atorvastatin  (LIPITOR) 10 MG tablet Take 10 mg by mouth daily. 08/13/23  Yes [provider]  acetaminophen  (TYLENOL ) 325 MG tablet Take 2 tablets (650 mg total) by mouth every 6 (six) hours as needed for mild pain (pain score 1-3), headache or fever (or Fever >/= 101). 08/08/23   Alexander, Natalie, DO  aspirin  EC 81 MG tablet Take 81 mg by mouth at bedtime. Pt states he has resumed as of 09/26/23    [provider]  calcium   carbonate (OS-CAL - DOSED IN MG OF ELEMENTAL CALCIUM ) 1250 (500 Ca) MG tablet Take 1 tablet (1,250 mg total) by mouth 2 (two) times daily with a meal. 08/08/23   Melodi Sprung, DO  Multiple Vitamin (MULTIVITAMIN WITH MINERALS) TABS tablet Take 1 tablet by mouth daily. 08/09/23   Alexander, Natalie, DO  triamterene -hydrochlorothiazide (DYAZIDE) 37.5-25 MG capsule Take 1 capsule by mouth daily. Patient not taking: Reported on 09/26/2023 09/25/23   [provider]    Physical Exam: Vitals:    09/30/23 0600 09/30/23 0645 09/30/23 0700 09/30/23 0730  BP: 93/71 (!) 89/73 (!) 87/68 (!) 86/64  Pulse:  68 65 (!) 50  Resp: (!) 26 16 12 12   Temp:      TempSrc:      SpO2:  100% 100% 100%  Weight:      Height:       Physical Exam Constitutional:      Appearance: He is normal weight.  HENT:     Head: Normocephalic and atraumatic.     Nose: Nose normal.  Eyes:     Pupils: Pupils are equal, round, and reactive to light.  Cardiovascular:     Rate and Rhythm: Normal rate and regular rhythm.  Pulmonary:     Effort: Pulmonary effort is normal.  Abdominal:     General: Bowel sounds are normal.     Comments: SP catheter stable    Musculoskeletal:        General: Normal range of motion.  Skin:    General: Skin is warm.  Neurological:     General: No focal deficit present.  Psychiatric:        Mood and Affect: Mood normal.     Data Reviewed:  There are no new results to review at this time.  DG Chest Port 1 View CLINICAL DATA:  83 year old male with possible sepsis.  EXAM: PORTABLE CHEST 1 VIEW  COMPARISON:  Chest CT 03/04/2022 and earlier.  FINDINGS: Portable AP supine views at 0600 hours. Chronic retrocardiac moderate to large gastric hiatal hernia. Stable cardiac size and mediastinal contours. Chronic pulmonary hyperinflation. Patchy and indistinct new multifocal right lung opacity in both the upper and lower lung. No superimposed pneumothorax, pulmonary edema, pleural effusion. No definite left lung involvement.  No acute osseous abnormality identified. Nondilated gas-filled bowel in the upper abdomen.  IMPRESSION: 1. Chronic pulmonary hyperinflation with indistinct new multifocal right lung opacity suspicious for developing multilobar bronchopneumonia/pneumonia in this setting. No pleural effusion. 2. Chronic moderate to large hiatal hernia.  Electronically Signed   By: Marlise Simpers M.D.   On: 09/30/2023 06:25 CT Cervical Spine Wo Contrast CLINICAL DATA:   83 year old male status post fall in garage ester day. Generalized weakness, decreased p.o. Hematuria. Shortness of breath.  EXAM: CT CERVICAL SPINE WITHOUT CONTRAST  TECHNIQUE: Multidetector CT imaging of the cervical spine was performed without intravenous contrast. Multiplanar CT image reconstructions were also generated.  RADIATION DOSE REDUCTION: This exam was performed according to the departmental dose-optimization program which includes automated exposure control, adjustment of the mA and/or kV according to patient size and/or use of iterative reconstruction technique.  COMPARISON:  Head CT today.  FINDINGS: Alignment: Maintained cervical lordosis. Cervicothoracic junction alignment is within normal limits. Bilateral posterior element alignment is within normal limits.  Skull base and vertebrae: Bone mineralization is within normal limits for age. Visualized skull base is intact. No atlanto-occipital dissociation. C1 and C2 appear intact and aligned. No acute osseous abnormality identified.  Soft tissues and  spinal canal: No prevertebral fluid or swelling. No visible canal hematoma. Bulky calcified carotid atherosclerosis in the neck, severe on the right (series 3, image 58).  Disc levels: Generally mild for age cervical spine degeneration and capacious CT appearance of the cervical spinal canal at most levels.  Upper chest: Visible upper thoracic levels appear intact. Clear lung apices with questionable emphysema. Trace intravenous gas at the thoracic inlet, likely IV access related.  IMPRESSION: 1. No acute traumatic injury identified in the cervical spine. Mild for age cervical spine degeneration. 2. Calcified carotid atherosclerosis in the neck, Severe at the Right ICA.  Electronically Signed   By: Marlise Simpers M.D.   On: 09/30/2023 05:57 CT HEAD WO CONTRAST ( ) CLINICAL DATA:  83 year old male status post fall in garage ester day. Generalized weakness,  decreased p.o. Hematuria. Shortness of breath.  EXAM: CT HEAD WITHOUT CONTRAST  TECHNIQUE: Contiguous axial images were obtained from the base of the skull through the vertex without intravenous contrast.  RADIATION DOSE REDUCTION: This exam was performed according to the departmental dose-optimization program which includes automated exposure control, adjustment of the mA and/or kV according to patient size and/or use of iterative reconstruction technique.  COMPARISON:  Brain MRI 04/12/2020.  Head CT 01/20/2017.  FINDINGS: Brain: Cerebral volume stable and within normal limits for age. Choroid plexus cysts, normal variant. No midline shift, ventriculomegaly, mass effect, evidence of mass lesion, intracranial hemorrhage or evidence of cortically based acute infarction.  Gray-white differentiation stable and within normal limits for age.  Vascular: Advanced calcified atherosclerosis at the skull base. No suspicious intracranial vascular hyperdensity.  Skull: Stable, intact.  Sinuses/Orbits: Visualized paranasal sinuses and mastoids are clear.  Other: No acute orbit or scalp soft tissue injury identified. Stable postoperative changes to the globes.  IMPRESSION: 1. No acute intracranial abnormality or acute traumatic injury identified. 2. Stable and negative for age noncontrast CT appearance of the brain.  Electronically Signed   By: Marlise Simpers M.D.   On: 09/30/2023 05:55  Lab Results  Component Value Date   WBC 14.8 (H) 09/30/2023   HGB 9.2 (L) 09/30/2023   HCT 27.4 (L) 09/30/2023   MCV 99.6 09/30/2023   PLT 215 09/30/2023   Last metabolic panel Lab Results  Component Value Date   GLUCOSE 135 (H) 09/30/2023   NA 129 (L) 09/30/2023   K 4.2 09/30/2023   CL 98 09/30/2023   CO2 20 (L) 09/30/2023   BUN 67 (H) 09/30/2023   CREATININE 2.41 (H) 09/30/2023   GFRNONAA 26 (L) 09/30/2023   CALCIUM  8.7 (L) 09/30/2023   PHOS 3.7 03/04/2022   PROT 5.8 (L) 09/30/2023    ALBUMIN 2.4 (L) 09/30/2023   LABGLOB 3.3 09/26/2023   AGRATIO 2.0 02/22/2022   BILITOT 1.1 09/30/2023   ALKPHOS 76 09/30/2023   AST 21 09/30/2023   ALT 20 09/30/2023   ANIONGAP 11 09/30/2023    Assessment and Plan: * Sepsis (HCC) Meeting sepsis criteria based on  RR 20s, SBP in 80s and WBC 14  Noted concern for multilobar pneumonia on CXR UA also indicative of infection on urine culture in setting of chronic indwelling foley  Prior history of ESBL E coli is a confounding issue  Lactate 1.4  Will place on meropenem and vancomycin for broad spectrum coverage  Panculture  Continue with aggressive LR IVF resuscitation Monitor     NSTEMI (non-ST elevated myocardial infarction) (HCC) Troponin in the 200s on presentation in setting of sepsis associated with multifocal pneumonia and  UTI No active chest pain Suspect secondary demand ischemia Stage IV CKD and poor renal clearance also confounding issues Will trend troponin Defer ASA in setting of transient hematuria  Monitor  CKD (chronic kidney disease) stage 4, GFR 15-29 ml/min (HCC) Creatinine 2.4 on presentation with GFR in the 20s Appears to be near baseline Monitor  Acute encephalopathy Positive generalized lethargy in setting of active sepsis with recurrent multifocal pneumonia and UTI CT head within normal  Check ammonia level Monitor   UTI (urinary tract infection) Urinary retention s/p SP cath  History of ESBL E coli infection  Urinary retention s/p suprapubic catheter placement  UA indicative of infection with presentation of sepsis on arrival  Noted 08/2023 Urine culture showing ESBL E coli  Will place on meropenem for infectious coverage pending urine culture  Suprapubic catheter exchanged in the ER  Follow    Pneumonia Multilobar pneumonia on presentation in the setting of sepsis  No hypoxia at present  Will place on IV meropenem and vancomycin in setting of concurrent UTI with history of ESBL E coli  infection  Blood and resp cultures  Incentive spirometry  Follow closely  Essential hypertension Hold BP regimen in setting hypotension assd with sepsis  Monitor    Mixed hyperlipidemia Cont lipitor       Advance Care Planning:   Code Status: Limited: Do not attempt resuscitation (DNR) -DNR-LIMITED -Do Not Intubate/DNI    Consults: Critical Care   Family Communication: Plan of care discussed with patient's POA Marsh Skeans.  Patient aware of systolic pressures into the 70s.  He is okay with pressors.  Wants to maintain DNR/DNI status.  Will consult critical care.  Severity of Illness: The appropriate patient status for this patient is OBSERVATION. Observation status is judged to be reasonable and necessary in order to provide the required intensity of service to ensure the patient's safety. The patient's presenting symptoms, physical exam findings, and initial radiographic and laboratory data in the context of their medical condition is felt to place them at decreased risk for further clinical deterioration. Furthermore, it is anticipated that the patient will be medically stable for discharge from the hospital within 2 midnights of admission.   Author: Corrinne Din, MD 09/30/2023 7:43 AM  For on call review [email or web address].

## 2023-09-30 NOTE — ED Notes (Addendum)
 Pt in CT at this time.

## 2023-10-01 ENCOUNTER — Inpatient Hospital Stay

## 2023-10-01 DIAGNOSIS — E43 Unspecified severe protein-calorie malnutrition: Secondary | ICD-10-CM | POA: Insufficient documentation

## 2023-10-01 DIAGNOSIS — G9341 Metabolic encephalopathy: Secondary | ICD-10-CM

## 2023-10-01 DIAGNOSIS — A419 Sepsis, unspecified organism: Secondary | ICD-10-CM | POA: Diagnosis not present

## 2023-10-01 DIAGNOSIS — R6520 Severe sepsis without septic shock: Secondary | ICD-10-CM | POA: Diagnosis not present

## 2023-10-01 LAB — COMPREHENSIVE METABOLIC PANEL WITH GFR
ALT: 22 U/L (ref 0–44)
AST: 26 U/L (ref 15–41)
Albumin: 1.7 g/dL — ABNORMAL LOW (ref 3.5–5.0)
Alkaline Phosphatase: 80 U/L (ref 38–126)
Anion gap: 10 (ref 5–15)
BUN: 59 mg/dL — ABNORMAL HIGH (ref 8–23)
CO2: 18 mmol/L — ABNORMAL LOW (ref 22–32)
Calcium: 8.2 mg/dL — ABNORMAL LOW (ref 8.9–10.3)
Chloride: 104 mmol/L (ref 98–111)
Creatinine, Ser: 2.05 mg/dL — ABNORMAL HIGH (ref 0.61–1.24)
GFR, Estimated: 32 mL/min — ABNORMAL LOW (ref >60)
Glucose, Bld: 95 mg/dL (ref 70–99)
Potassium: 3.9 mmol/L (ref 3.5–5.1)
Sodium: 132 mmol/L — ABNORMAL LOW (ref 135–145)
Total Bilirubin: 0.7 mg/dL (ref 0.0–1.2)
Total Protein: 4.6 g/dL — ABNORMAL LOW (ref 6.5–8.1)

## 2023-10-01 LAB — CBC
HCT: 25.2 % — ABNORMAL LOW (ref 39.0–52.0)
Hemoglobin: 8.5 g/dL — ABNORMAL LOW (ref 13.0–17.0)
MCH: 33.1 pg (ref 26.0–34.0)
MCHC: 33.7 g/dL (ref 30.0–36.0)
MCV: 98.1 fL (ref 80.0–100.0)
Platelets: 184 10*3/uL (ref 150–400)
RBC: 2.57 MIL/uL — ABNORMAL LOW (ref 4.22–5.81)
RDW: 14.5 % (ref 11.5–15.5)
WBC: 8.1 10*3/uL (ref 4.0–10.5)
nRBC: 0 % (ref 0.0–0.2)

## 2023-10-01 LAB — VANCOMYCIN, RANDOM: Vancomycin Rm: 9 ug/mL

## 2023-10-01 MED ORDER — ENSURE ENLIVE PO LIQD
237.0000 mL | Freq: Three times a day (TID) | ORAL | Status: DC
  Administered 2023-10-01 – 2023-10-13 (×28): 237 mL via ORAL

## 2023-10-01 MED ORDER — VANCOMYCIN HCL IN DEXTROSE 1-5 GM/200ML-% IV SOLN
1000.0000 mg | INTRAVENOUS | Status: DC
  Administered 2023-10-01: 1000 mg via INTRAVENOUS
  Filled 2023-10-01: qty 200

## 2023-10-01 MED ORDER — ADULT MULTIVITAMIN W/MINERALS CH
1.0000 | ORAL_TABLET | Freq: Every day | ORAL | Status: DC
  Administered 2023-10-01 – 2023-10-14 (×12): 1 via ORAL
  Filled 2023-10-01 (×14): qty 1

## 2023-10-01 NOTE — Evaluation (Signed)
 Physical Therapy Evaluation Patient Details Name: Zachary [name] MRN: [id number] DOB: Mar 02, 1941 Today's Date: 10/01/2023  History of Present Illness  Zachary [name] is an 82yoM who comes to [facility] with acute weakness at home, s/p recent fall, no significant injury sustained. Pt still generally weak from his admission last month. PMH: HLD, HTN, CAD, menieres disease, B12 deficiency, suprapubic catheter.  Clinical Impression  Pt not feeling any better despite start of ABX< still feels ill and quite weak. Pt is kind, patient, and motivated to try his best with mobility assessment, no physical assistance needed in session, but heavy effort requires for rising to standing and AMB distance limited by severe DOE after 45ft which correlated to significant tachycardia into 130s. Pt returned to be at end of evaluation, all needs met.         If plan is discharge home, recommend the following: A little help with walking and/or transfers;Assistance with cooking/housework;Assist for transportation;Help with stairs or ramp for entrance   Can travel by private vehicle   No    Equipment Recommendations None recommended by PT  Recommendations for Other Services       Functional Status Assessment Patient has had a recent decline in their functional status and demonstrates the ability to make significant improvements in function in a reasonable and predictable amount of time.     Precautions / Restrictions Precautions Precautions: Fall Restrictions Weight Bearing Restrictions Per Provider Order: No      Mobility  Bed Mobility Overal bed mobility: Modified Independent                  Transfers Overall transfer level: Needs assistance Equipment used: Rolling walker (2 wheels) Transfers: Sit to/from Stand Sit to Stand: Supervision           General transfer comment: slight elevation, but pt really powers through, heavy effort    Ambulation/Gait Ambulation/Gait assistance:  Contact guard assist, Supervision Gait Distance (Feet): 35 Feet Assistive device: Rolling walker (2 wheels) Gait Pattern/deviations: Step-to pattern       General Gait Details: wants to attempt AMB to door and back, does well, but has signififcant exhaustion thereafter, throws self into supine on bed, HR in 130s x2 minute.  Stairs            Wheelchair Mobility     Tilt Bed    Modified Rankin (Stroke Patients Only)       Balance                                             Pertinent Vitals/Pain Pain Assessment Pain Assessment: No/denies pain    Home Living Family/patient expects to be discharged to:: Private residence Living Arrangements: Spouse/significant other Available Help at Discharge: Family Type of Home: House Home Access: Level entry       Home Layout: One level Home Equipment: Agricultural consultant (2 wheels);Shower seat;Grab bars - toilet;Grab bars - tub/shower      Prior Function Prior Level of Function : Independent/Modified Independent;Driving             Mobility Comments: since prior admission, has been limited to short household distances, HHPT not started yet       Extremity/Trunk Assessment                Communication        Cognition Arousal: Alert Behavior During  Therapy: WFL for tasks assessed/performed   PT - Cognitive impairments: No apparent impairments                                 Cueing       General Comments      Exercises     Assessment/Plan    PT Assessment Patient needs continued PT services  PT Problem List Decreased strength;Decreased range of motion;Decreased activity tolerance;Decreased balance;Decreased mobility;Decreased coordination       PT Treatment Interventions DME instruction;Gait training;Stair training;Functional mobility training;Therapeutic activities;Therapeutic exercise;Balance training;Patient/family education    PT Goals (Current goals can be  found in the Care Plan section)  Acute Rehab PT Goals Patient Stated Goal: regain strenght, be able to return to short community AMB PT Goal Formulation: With patient Time For Goal Achievement: 10/15/23 Potential to Achieve Goals: Fair    Frequency Min 2X/week     Co-evaluation               AM-PAC PT "6 Clicks" Mobility  Outcome Measure Help needed turning from your back to your side while in a flat bed without using bedrails?: A Little Help needed moving from lying on your back to sitting on the side of a flat bed without using bedrails?: A Little Help needed moving to and from a bed to a chair (including a wheelchair)?: A Little Help needed standing up from a chair using your arms (e.g., wheelchair or bedside chair)?: A Little Help needed to walk in hospital room?: A Little Help needed climbing 3-5 steps with a railing? : A Little 6 Click Score: 18    End of Session   Activity Tolerance: Patient tolerated treatment well;Patient limited by fatigue;No increased pain Patient left: in bed;with call bell/phone within reach;with family/visitor present Nurse Communication: Mobility status (HR response) PT Visit Diagnosis: Difficulty in walking, not elsewhere classified (R26.2);Muscle weakness (generalized) (M62.81)    Time: 1009-1030 PT Time Calculation (min) (ACUTE ONLY): 21 min   Charges:   PT Evaluation $PT Eval Moderate Complexity: 1 Mod   PT General Charges $$ ACUTE PT VISIT: 1 Visit       10:44 AM, 10/01/23 Dawn Eth, PT, DPT Physical Therapist - [facility]  [phone] (ASCOM)    Zaley Talley C 10/01/2023, 10:42 AM

## 2023-10-01 NOTE — Plan of Care (Signed)
  Problem: Respiratory: Goal: Ability to maintain adequate ventilation will improve Outcome: Progressing   Problem: Clinical Measurements: Goal: Will remain free from infection Outcome: Progressing Goal: Respiratory complications will improve Outcome: Progressing   Problem: Elimination: Goal: Will not experience complications related to bowel motility Outcome: Progressing Goal: Will not experience complications related to urinary retention Outcome: Progressing   Problem: Pain Managment: Goal: General experience of comfort will improve and/or be controlled Outcome: Progressing   Problem: Safety: Goal: Ability to remain free from injury will improve Outcome: Progressing   Problem: Skin Integrity: Goal: Risk for impaired skin integrity will decrease Outcome: Progressing

## 2023-10-01 NOTE — Progress Notes (Signed)
 Initial Nutrition Assessment  DOCUMENTATION CODES:   Underweight, Severe malnutrition in context of chronic illness  INTERVENTION:   -Continue regular diet -MVI with minerals daily -Ensure Enlive po TID, each supplement provides 350 kcal and 20 grams of protein  NUTRITION DIAGNOSIS:   Severe Malnutrition related to social / environmental circumstances as evidenced by percent weight loss, severe fat depletion, severe muscle depletion.  GOAL:   Patient will meet greater than or equal to 90% of their needs  MONITOR:   PO intake, Supplement acceptance  REASON FOR ASSESSMENT:   Consult Assessment of nutrition requirement/status, Poor PO  ASSESSMENT:   Pt with a past medical history of urinary retention status post suprapubic catheter placement admitted for history of weakness.  Pt admitted with sepsis and NSTEMI.   Reviewed I/O's: +1.2 L x 24 hours and +2.3 L since admission  UOP: 300 ml x 24 hours  Case discussed with RN. Pt with no difficulty chewing and swallowing and able to provide a history; RN performed bedside swallow screen earlier this shift. Pt consumed most of his breakfast this morning.   Spoke with pt at bedside, who was pleasant and in good spirits today. Pt complained of being weak, but reports this has improved since admission. Pt shares that he has a very poor appetite, which he attributes to lack of appetite from subrapubic catheter placement 6 weeks ago. Pt typically consumes 2 meals per day ([employer] or BBQ and sandwich for dinner).   Pt reports he has lost about 30# over the past month. Reviewed wt hx; pt has experienced a 12.1% wt loss over the past month, which is significant for time frame.   Discussed importance of good meal and supplement intake to promote healing. Pt started drinking 2 Boost supplements daily PTA and lieks these. Pt amenable to Ensure and discussed importance of continuing supplements to help promote nutritional adequacy.    Medications reviewed and included lovenox.   Labs reviewed: Na: 132.    NUTRITION - FOCUSED PHYSICAL EXAM:  Flowsheet Row Most Recent Value  Orbital Region Severe depletion  Upper Arm Region Severe depletion  Thoracic and Lumbar Region Severe depletion  Buccal Region Severe depletion  Temple Region Severe depletion  Clavicle Bone Region Severe depletion  Clavicle and Acromion Bone Region Severe depletion  Scapular Bone Region Severe depletion  Dorsal Hand Severe depletion  Patellar Region Severe depletion  Anterior Thigh Region Severe depletion  Posterior Calf Region Severe depletion  Edema (RD Assessment) None  Hair Reviewed  Eyes Reviewed  Mouth Reviewed  Skin Reviewed  Nails Reviewed       Diet Order:   Diet Order             Diet regular Room service appropriate? Yes; Fluid consistency: Thin  Diet effective now                   EDUCATION NEEDS:   Education needs have been addressed  Skin:  Skin Assessment: Reviewed RN Assessment  Last BM:  Unknown  Height:   Ht Readings from Last 1 Encounters:  09/30/23 6\' 2"  (1.88 m)    Weight:   Wt Readings from Last 1 Encounters:  09/30/23 56.7 kg    Ideal Body Weight:  86.4 kg  BMI:  Body mass index is 16.05 kg/m.  Estimated Nutritional Needs:   Kcal:  2050-2250  Protein:  115-130 grams  Fluid:  2.0-2.2 L    Herschel Lords, RD, LDN, CDCES Registered Dietitian III Certified  Diabetes Care and Education Specialist If unable to reach this RD, please use "RD Inpatient" group chat on secure chat between hours of 8am-4 pm daily

## 2023-10-01 NOTE — Progress Notes (Signed)
 PROGRESS NOTE    Zachary [name]   ZOX:[id number] DOB: [birth year]  DOA: 09/30/2023 Date of Service: 10/01/23 which is hospital day 1  PCP: [name], MD    Hospital course / significant events:   HPI: Zachary [name] is a 83 y.o. male with medical history significant of HTN, urinary retention s/p suprapubic catheter placement,  HLD. Presents to ED from home via EMS w/ weakness.   Of note, hospitalized last month 03/04-03/07/25 w/ substantial urinary retention and AKI, Foley placed, f/u urology and suprapubic cath was placed 08/26/23. Also notable, has been following w/ PCP last visit 09/24/23 concern for unintentional weight loss, SPEP --> pending. Has been following w/ hematology last visit 09/26/23 for anemia and lung nodule, flow cytometry --> neg, CT chest --> pending   04/29: admitted to hospitalist service w/ sepsis, pna, uti, encephalopathy. ICU consult but he did not need pressors 04/30: await cultures, CT chest to eval pna/other RML    Consultants:  PCCM  Procedures/Surgeries: none      ASSESSMENT & PLAN:   Sepsis d/t UTI, +/- pneumonia  Noted concern for multilobar pneumonia on CXR UA also indicative of infection on urine culture in setting of chronic indwelling foley  Prior history of ESBL E coli is a confounding issue  Lactate 1.4  Will place on meropenem and vancomycin for broad spectrum coverage  Panculture  Continue with aggressive LR IVF resuscitation Monitor     RML nodule on CT abd/pelvis  On CXR indistinct new multifocal right lung opacity suspicious for developing multilobar bronchopneumonia/pneumonia Clinically significant in setting of sepsis, weight los, ddx would include pneumonia, malignancy CT chest    Elevated troponin likely demand ischemis in setting of sepsis, less likely NSTEMI (non-ST elevated myocardial infarction)  Troponin in the 200s on presentation in setting of sepsis associated with multifocal pneumonia and  UTI No active chest pain Suspect secondary demand ischemia Stage IV CKD and poor renal clearance also confounding issues Will trend troponin Defer ASA in setting of hematuria  Monitor   CKD (chronic kidney disease) stage 4, GFR 15-29 ml/min  Creatinine 2.4 on presentation with GFR in the 20s Appears to be near baseline Monitor   Acute encephalopathy Positive generalized lethargy in setting of active sepsis with recurrent multifocal pneumonia and UTI CT head within normal  Monitor   UTI (urinary tract infection) Urinary retention s/p SP cath  History of ESBL E coli infection based on culture 03/12 UA indicative of infection POA 08/2023 Urine culture showing ESBL E coli  meropenem for infectious coverage pending urine culture  Suprapubic catheter exchanged in the ER  Follow cultures Keep cath site clean/dry    Pneumonia Multilobar pneumonia on presentation on CXR in the setting of sepsis  No hypoxia at present  Will place on IV meropenem and vancomycin in setting of concurrent UTI with history of ESBL E coli infection  CT chest given recent findings on CT abd/pelv concerning for RML nodule Blood and resp cultures  Incentive spirometry  Follow closely   Essential hypertension Hold BP regimen in setting hypotension assd with sepsis  Monitor     Mixed hyperlipidemia hold lipitor          underweight based on BMI: Body mass index is 16.05 kg/m.[name] Significantly low or high BMI is associated with higher medical risk.  Underweight - under 18  overweight - 25 to 29 obese - 30 or more Class 1 obesity: BMI of 30.0 to 34 Class 2 obesity:  BMI of 35.0 to 39 Class 3 obesity: BMI of 40.0 to 49 Super Morbid Obesity: BMI 50-59 Super-super Morbid Obesity: BMI 60+ Healthy nutrition and physical activity advised as adjunct to other disease management and risk reduction treatments    DVT prophylaxis: lovenox  IV fluids: LR continuous IV fluids can dc once taking po  Nutrition:  per dietician Central lines / other devices: suprapubic cath  Code Status: DNR ACP documentation reviewed:  none on file in [facility]  TOC needs: TBD Medical barriers to dispo: sepsis/infection awiting cultures and clinical improvement. Expected medical readiness for discharge several more days.              Subjective / Brief ROS:  Patient reports feeling tired but no other concerns Denies CP/SOB.  Pain controlled.  Denies new weakness.  Tolerating diet.  Reports no concerns w/ urination/defecation.   Family Communication: none at this time     Objective Findings:  Vitals:   10/01/23 1028 10/01/23 1035 10/01/23 1144 10/01/23 1620  BP:   (!) 103/59 (!) 125/58  Pulse: (!) 135 100 78 81  Resp: (!) 23 20 19 19   Temp:   98.6 F (37 C) 98.2 F (36.8 C)  TempSrc:      SpO2:   100% 99%  Weight:      Height:        Intake/Output Summary (Last 24 hours) at 10/01/2023 1643 Last data filed at 10/01/2023 1044 Gross per 24 hour  Intake --  Output 300 ml  Net -300 ml   Filed Weights   09/30/23 0457  Weight: 56.7 kg    Examination:  Physical Exam Constitutional:      General: He is not in acute distress.    Appearance: He is ill-appearing (frail, thin). He is not toxic-appearing.  Cardiovascular:     Rate and Rhythm: Normal rate and regular rhythm.  Pulmonary:     Effort: Pulmonary effort is normal.     Breath sounds: Normal breath sounds.  Abdominal:     General: Bowel sounds are normal. There is no distension.  Musculoskeletal:     Right lower leg: No edema.     Left lower leg: No edema.  Skin:    Comments: Examined suprapubic cath site - skin does not appear infected but it is damp dressing  Neurological:     General: No focal deficit present.     Mental Status: He is alert and oriented to person, place, and time. Mental status is at baseline.  Psychiatric:        Mood and Affect: Mood normal.        Behavior: Behavior normal.           Scheduled Medications:   enoxaparin (LOVENOX) injection  30 mg Subcutaneous Q24H   feeding supplement  237 mL Oral TID BM   multivitamin with minerals  1 tablet Oral Daily    Continuous Infusions:  meropenem (MERREM) IV 500 mg (10/01/23 1125)   vancomycin 1,000 mg (10/01/23 0949)    PRN Medications:  acetaminophen  **OR** acetaminophen , ondansetron  **OR** ondansetron  (ZOFRAN ) IV  Antimicrobials from admission:  Anti-infectives (From admission, onward)    Start     Dose/Rate Route Frequency Ordered Stop   10/01/23 0900  vancomycin (VANCOCIN) IVPB 1000 mg/200 mL premix        1,000 mg 200 mL/hr over 60 Minutes Intravenous Every 48 hours 10/01/23 0736     10/01/23 0800  vancomycin variable dose per unstable renal function (pharmacist  dosing)  Status:  Discontinued         Does not apply See admin instructions 09/30/23 0747 10/01/23 0738   09/30/23 1000  meropenem (MERREM) 500 mg in sodium chloride  0.9 % 100 mL IVPB        500 mg 200 mL/hr over 30 Minutes Intravenous Every 12 hours 09/30/23 0728     09/30/23 0500  ceFEPIme (MAXIPIME) 2 g in sodium chloride  0.9 % 100 mL IVPB        2 g 200 mL/hr over 30 Minutes Intravenous  Once 09/30/23 0458 09/30/23 0608   09/30/23 0500  metroNIDAZOLE (FLAGYL) IVPB 500 mg        500 mg 100 mL/hr over 60 Minutes Intravenous  Once 09/30/23 0458 09/30/23 0826   09/30/23 0500  vancomycin (VANCOCIN) IVPB 1000 mg/200 mL premix        1,000 mg 200 mL/hr over 60 Minutes Intravenous  Once 09/30/23 0458 09/30/23 4098           Data Reviewed:  I have personally reviewed the following...  CBC: Recent Labs  Lab 09/26/23 1300 09/30/23 0506 10/01/23 0429  WBC 11.4* 14.8* 8.1  NEUTROABS 9.8* 12.8*  --   HGB 10.8* 9.2* 8.5*  HCT 32.0* 27.4* 25.2*  MCV 98.2 99.6 98.1  PLT 300 215 184   Basic Metabolic Panel: Recent Labs  Lab 09/30/23 0506 10/01/23 0429  NA 129* 132*  K 4.2 3.9  CL 98 104  CO2 20* 18*  GLUCOSE 135* 95   BUN 67* 59*  CREATININE 2.41* 2.05*  CALCIUM  8.7* 8.2*  MG 2.1  --    GFR: Estimated Creatinine Clearance: 23 mL/min (A) (by C-G formula based on SCr of 2.05 mg/dL (H)). Liver Function Tests: Recent Labs  Lab 09/30/23 0506 10/01/23 0429  AST 21 26  ALT 20 22  ALKPHOS 76 80  BILITOT 1.1 0.7  PROT 5.8* 4.6*  ALBUMIN 2.4* 1.7*   No results for input(s): "LIPASE", "AMYLASE" in the last 168 hours. No results for input(s): "AMMONIA" in the last 168 hours. Coagulation Profile: Recent Labs  Lab 09/30/23 0506 09/30/23 0909  INR 1.2 2.3*   Cardiac Enzymes: No results for input(s): "CKTOTAL", "CKMB", "CKMBINDEX", "TROPONINI" in the last 168 hours. BNP (last 3 results) No results for input(s): "PROBNP" in the last 8760 hours. HbA1C: No results for input(s): "HGBA1C" in the last 72 hours. CBG: No results for input(s): "GLUCAP" in the last 168 hours. Lipid Profile: No results for input(s): "CHOL", "HDL", "LDLCALC", "TRIG", "CHOLHDL", "LDLDIRECT" in the last 72 hours. Thyroid  Function Tests: No results for input(s): "TSH", "T4TOTAL", "FREET4", "T3FREE", "THYROIDAB" in the last 72 hours. Anemia Panel: No results for input(s): "VITAMINB12", "FOLATE", "FERRITIN", "TIBC", "IRON", "RETICCTPCT" in the last 72 hours. Most Recent Urinalysis On File:     Component Value Date/Time   COLORURINE YELLOW (A) 09/30/2023 0557   APPEARANCEUR CLOUDY (A) 09/30/2023 0557   APPEARANCEUR Hazy (A) 08/12/2023 1141   LABSPEC 1.015 09/30/2023 0557   PHURINE 5.0 09/30/2023 0557   GLUCOSEU NEGATIVE 09/30/2023 0557   HGBUR SMALL (A) 09/30/2023 0557   BILIRUBINUR NEGATIVE 09/30/2023 0557   BILIRUBINUR Negative 08/12/2023 1141   KETONESUR NEGATIVE 09/30/2023 0557   PROTEINUR 30 (A) 09/30/2023 0557   UROBILINOGEN 1.0 06/30/2019 0823   NITRITE NEGATIVE 09/30/2023 0557   LEUKOCYTESUR LARGE (A) 09/30/2023 0557   Sepsis Labs: @LABRCNTIP (procalcitonin:4,lacticidven:4) Microbiology: Recent Results (from  the past 240 hours)  Resp panel by RT-PCR (RSV, Flu A&B, Covid) Anterior  Nasal Swab     Status: None   Collection Time: 09/30/23  5:06 AM   Specimen: Anterior Nasal Swab  Result Value Ref Range Status   SARS Coronavirus 2 by RT PCR NEGATIVE NEGATIVE Final    Comment: (NOTE) SARS-CoV-2 target nucleic acids are NOT DETECTED.  The SARS-CoV-2 RNA is generally detectable in upper respiratory specimens during the acute phase of infection. The lowest concentration of SARS-CoV-2 viral copies this assay can detect is 138 copies/mL. A negative result does not preclude SARS-Cov-2 infection and should not be used as the sole basis for treatment or other patient management decisions. A negative result may occur with  improper specimen collection/handling, submission of specimen other than nasopharyngeal swab, presence of viral mutation(s) within the areas targeted by this assay, and inadequate number of viral copies(<138 copies/mL). A negative result must be combined with clinical observations, patient history, and epidemiological information. The expected result is Negative.  Fact Sheet for Patients:  BloggerCourse.com  Fact Sheet for Healthcare Providers:  SeriousBroker.it  This test is no t yet approved or cleared by the [place]  FDA and  has been authorized for detection and/or diagnosis of SARS-CoV-2 by FDA under an Emergency Use Authorization (EUA). This EUA will remain  in effect (meaning this test can be used) for the duration of the COVID-19 declaration under Section 564(b)(1) of the Act, 21 U.S.C.section 360bbb-3(b)(1), unless the authorization is terminated  or revoked sooner.       Influenza A by PCR NEGATIVE NEGATIVE Final   Influenza B by PCR NEGATIVE NEGATIVE Final    Comment: (NOTE) The Xpert Xpress SARS-CoV-2/FLU/RSV plus assay is intended as an aid in the diagnosis of influenza from Nasopharyngeal swab specimens  and should not be used as a sole basis for treatment. Nasal washings and aspirates are unacceptable for Xpert Xpress SARS-CoV-2/FLU/RSV testing.  Fact Sheet for Patients: BloggerCourse.com  Fact Sheet for Healthcare Providers: SeriousBroker.it  This test is not yet approved or cleared by the [place]  FDA and has been authorized for detection and/or diagnosis of SARS-CoV-2 by FDA under an Emergency Use Authorization (EUA). This EUA will remain in effect (meaning this test can be used) for the duration of the COVID-19 declaration under Section 564(b)(1) of the Act, 21 U.S.C. section 360bbb-3(b)(1), unless the authorization is terminated or revoked.     Resp Syncytial Virus by PCR NEGATIVE NEGATIVE Final    Comment: (NOTE) Fact Sheet for Patients: BloggerCourse.com  Fact Sheet for Healthcare Providers: SeriousBroker.it  This test is not yet approved or cleared by the [place]  FDA and has been authorized for detection and/or diagnosis of SARS-CoV-2 by FDA under an Emergency Use Authorization (EUA). This EUA will remain in effect (meaning this test can be used) for the duration of the COVID-19 declaration under Section 564(b)(1) of the Act, 21 U.S.C. section 360bbb-3(b)(1), unless the authorization is terminated or revoked.  Performed at [facility], [address] [place], [place] [address]   Blood Culture (routine x 2)     Status: None (Preliminary result)   Collection Time: 09/30/23  5:06 AM   Specimen: BLOOD  Result Value Ref Range Status   Specimen Description BLOOD LEFT [facility]  Final   Special Requests   Final    BOTTLES DRAWN AEROBIC AND ANAEROBIC Blood Culture results may not be optimal due to an inadequate volume of blood received in culture bottles   Culture   Final    NO GROWTH < 24 HOURS Performed at [facility]  [facility], [address]  [place], [place] [address]    Report Status PENDING  Incomplete  Blood Culture (routine x 2)     Status: None (Preliminary result)   Collection Time: 09/30/23  5:06 AM   Specimen: BLOOD  Result Value Ref Range Status   Specimen Description BLOOD RIGTH [facility]  Final   Special Requests   Final    BOTTLES DRAWN AEROBIC AND ANAEROBIC Blood Culture adequate volume   Culture   Final    NO GROWTH < 24 HOURS Performed at [facility], [address], [place], [place] [address]    Report Status PENDING  Incomplete  Urine Culture     Status: None (Preliminary result)   Collection Time: 09/30/23  5:57 AM   Specimen: Urine, Suprapubic  Result Value Ref Range Status   Specimen Description   Final    URINE, SUPRAPUBIC Performed at [facility], [address], [place], [place] [address]    Special Requests   Final    NONE Reflexed from [phone] Performed at [facility], [address] [place], [place] [address]    Culture PENDING  Incomplete   Report Status PENDING  Incomplete      Radiology Studies last 3 days: [facility] Chest Port 1 View Result Date: 09/30/2023 CLINICAL DATA:  83 year old male with possible sepsis. EXAM: PORTABLE CHEST 1 VIEW COMPARISON:  Chest CT 03/04/2022 and earlier. FINDINGS: Portable AP supine views at 0600 hours. Chronic retrocardiac moderate to large gastric hiatal hernia. Stable cardiac size and mediastinal contours. Chronic pulmonary hyperinflation. Patchy and indistinct new multifocal right lung opacity in both the upper and lower lung. No superimposed pneumothorax, pulmonary edema, pleural effusion. No definite left lung involvement. No acute osseous abnormality identified. Nondilated gas-filled bowel in the upper abdomen. IMPRESSION: 1. Chronic pulmonary hyperinflation with indistinct new multifocal right lung opacity suspicious for developing multilobar bronchopneumonia/pneumonia in this setting. No pleural effusion. 2. Chronic moderate to large hiatal  hernia. Electronically Signed   By: Marlise Simpers M.D.   On: 09/30/2023 06:25   CT Cervical Spine Wo Contrast Result Date: 09/30/2023 CLINICAL DATA:  83 year old male status post fall in garage ester day. Generalized weakness, decreased p.o. Hematuria. Shortness of breath. EXAM: CT CERVICAL SPINE WITHOUT CONTRAST TECHNIQUE: Multidetector CT imaging of the cervical spine was performed without intravenous contrast. Multiplanar CT image reconstructions were also generated. RADIATION DOSE REDUCTION: This exam was performed according to the departmental dose-optimization program which includes automated exposure control, adjustment of the mA and/or kV according to patient size and/or use of iterative reconstruction technique. COMPARISON:  Head CT today. FINDINGS: Alignment: Maintained cervical lordosis. Cervicothoracic junction alignment is within normal limits. Bilateral posterior element alignment is within normal limits. Skull base and vertebrae: Bone mineralization is within normal limits for age. Visualized skull base is intact. No atlanto-occipital dissociation. C1 and C2 appear intact and aligned. No acute osseous abnormality identified. Soft tissues and spinal canal: No prevertebral fluid or swelling. No visible canal hematoma. Bulky calcified carotid atherosclerosis in the neck, severe on the right (series 3, image 58). Disc levels: Generally mild for age cervical spine degeneration and capacious CT appearance of the cervical spinal canal at most levels. Upper chest: Visible upper thoracic levels appear intact. Clear lung apices with questionable emphysema. Trace intravenous gas at the thoracic inlet, likely IV access related. IMPRESSION: 1. No acute traumatic injury identified in the cervical spine. Mild for age cervical spine degeneration. 2. Calcified carotid atherosclerosis in the  neck, Severe at the Right ICA. Electronically Signed   By: Marlise Simpers M.D.   On: 09/30/2023 05:57   CT HEAD WO CONTRAST ( ) Result  Date: 09/30/2023 CLINICAL DATA:  83 year old male status post fall in garage ester day. Generalized weakness, decreased p.o. Hematuria. Shortness of breath. EXAM: CT HEAD WITHOUT CONTRAST TECHNIQUE: Contiguous axial images were obtained from the base of the skull through the vertex without intravenous contrast. RADIATION DOSE REDUCTION: This exam was performed according to the departmental dose-optimization program which includes automated exposure control, adjustment of the mA and/or kV according to patient size and/or use of iterative reconstruction technique. COMPARISON:  Brain MRI 04/12/2020.  Head CT 01/20/2017. FINDINGS: Brain: Cerebral volume stable and within normal limits for age. Choroid plexus cysts, normal variant. No midline shift, ventriculomegaly, mass effect, evidence of mass lesion, intracranial hemorrhage or evidence of cortically based acute infarction. Gray-white differentiation stable and within normal limits for age. Vascular: Advanced calcified atherosclerosis at the skull base. No suspicious intracranial vascular hyperdensity. Skull: Stable, intact. Sinuses/Orbits: Visualized paranasal sinuses and mastoids are clear. Other: No acute orbit or scalp soft tissue injury identified. Stable postoperative changes to the globes. IMPRESSION: 1. No acute intracranial abnormality or acute traumatic injury identified. 2. Stable and negative for age noncontrast CT appearance of the brain. Electronically Signed   By: Marlise Simpers M.D.   On: 09/30/2023 05:55       Time spent: 50 min     Melodi Sprung, DO [facility] 10/01/2023, 4:43 PM    Dictation software may have been used to generate the above note. Typos may occur and escape review in typed/dictated notes. Please contact Dr Authur Leghorn directly for clarity if needed.  Staff may message me via secure chat in Epic  but this may not receive an immediate response,  please page me for urgent matters!  If 7PM-7AM, please contact  night coverage [email or web address]

## 2023-10-01 NOTE — NC FL2 (Signed)
 [place]  MEDICAID [id number] LEVEL OF CARE FORM     IDENTIFICATION  Patient Name: Zachary [name] Birthdate: [birth year] Sex: male Admission Date (Current Location): 09/30/2023  [facility] and [place] Number:  Chiropodist and Address:  [facility], [address], [place], [place] [address]      Provider Number: [id number]  Attending Physician Name and Address:  Melodi Sprung, DO  Relative Name and Phone Number:       Current Level of Care: Hospital Recommended Level of Care: Skilled Nursing Facility Prior Approval Number:    Date Approved/Denied:   PASRR Number: [id number]  Discharge Plan: SNF    Current Diagnoses: Patient Active Problem List   Diagnosis Date Noted   Sepsis (HCC) 09/30/2023   Pneumonia 09/30/2023   UTI (urinary tract infection) 09/30/2023   Acute encephalopathy 09/30/2023   CKD (chronic kidney disease) stage 4, GFR 15-29 ml/min (HCC) 09/30/2023   NSTEMI (non-ST elevated myocardial infarction) (HCC) 09/30/2023   Normocytic anemia 09/26/2023   Weight loss 09/26/2023   Leukocytosis 09/26/2023   Acute kidney injury superimposed on chronic kidney disease (HCC) 08/05/2023   Peritonitis (HCC) 08/05/2023   Fluid overload 08/05/2023   Aneurysm of thoracic aorta (HCC) 03/13/2022   Lung nodule 03/13/2022   History of colonic polyps    B12 deficiency anemia 08/03/2019   Elevated MCV 08/03/2019   Cyst, baker's knee, right 07/27/2019   Peripheral edema- right lower leg  07/27/2019   Coronary artery disease involving native coronary artery of native heart without angina pectoris 04/12/2019   Valvular heart disease 04/12/2019   Pneumothorax on right 05/15/2015   Benign essential tremor 02/28/2015   Mixed hyperlipidemia 02/28/2015   Essential hypertension 02/28/2015   Hypercholesterolemia without hypertriglyceridemia 02/28/2015   Vertigo 02/28/2015    Orientation RESPIRATION BLADDER Height & Weight     Self, Time,  Situation, Place  Normal Indwelling catheter (Suprapubic catheter) Weight: 125 lb (56.7 kg) Height:  6\' 2"  (188 cm)  BEHAVIORAL SYMPTOMS/MOOD NEUROLOGICAL BOWEL NUTRITION STATUS   (None)  (None) Continent Diet (Regular)  AMBULATORY STATUS COMMUNICATION OF NEEDS Skin   Limited Assist Verbally Skin abrasions                       Personal Care Assistance Level of Assistance  Bathing, Feeding, Dressing Bathing Assistance: Limited assistance Feeding assistance: Limited assistance Dressing Assistance: Limited assistance     Functional Limitations Info  Sight, Hearing, Speech Sight Info: Adequate Hearing Info: Adequate Speech Info: Adequate    SPECIAL CARE FACTORS FREQUENCY  PT (By licensed PT), OT (By licensed OT)     PT Frequency: 5 x week OT Frequency: 5 x week            Contractures Contractures Info: Not present    Additional Factors Info  Code Status, Allergies, Isolation Precautions Code Status Info: DNR Allergies Info: NKDA     Isolation Precautions Info: Contact: ESBL     Current Medications (10/01/2023):  This is the current hospital active medication list Current Facility-Administered Medications  Medication Dose Route Frequency Provider Last Rate Last Admin   acetaminophen  (TYLENOL ) tablet 650 mg  650 mg Oral Q6H PRN Newton, Steven J, MD       Or   acetaminophen  (TYLENOL ) suppository 650 mg  650 mg Rectal Q6H PRN Newton, Steven J, MD       enoxaparin (LOVENOX) injection 30 mg  30 mg Subcutaneous Q24H Janette Medley, RPH   30  mg at 09/30/23 2115   feeding supplement (ENSURE ENLIVE / ENSURE PLUS) liquid 237 mL  237 mL Oral TID BM Alexander, Natalie, DO       meropenem (MERREM) 500 mg in sodium chloride  0.9 % 100 mL IVPB  500 mg Intravenous Q12H Ramonita Burow, RPH 200 mL/hr at 10/01/23 1125 500 mg at 10/01/23 1125   multivitamin with minerals tablet 1 tablet  1 tablet Oral Daily Alexander, Natalie, DO       ondansetron  (ZOFRAN ) tablet 4 mg  4 mg  Oral Q6H PRN Newton, Steven J, MD       Or   ondansetron  (ZOFRAN ) injection 4 mg  4 mg Intravenous Q6H PRN Newton, Steven J, MD       vancomycin (VANCOCIN) IVPB 1000 mg/200 mL premix  1,000 mg Intravenous Q48H Patel, Kishan S, RPH 200 mL/hr at 10/01/23 0949 1,000 mg at 10/01/23 9528     Discharge Medications: Please see discharge summary for a list of discharge medications.  Relevant Imaging Results:  Relevant Lab Results:   Additional Information SS#: [id number]  Odilia Bennett, LCSW

## 2023-10-01 NOTE — Evaluation (Signed)
 Occupational Therapy Evaluation Patient Details Name: Zachary [name] MRN: [id number] DOB: November 26, 1940 Today's Date: 10/01/2023   History of Present Illness   Zachary [name] is an 82yoM who comes to [facility] with acute weakness at home, s/p recent fall, no significant injury sustained. Pt still generally weak from his admission last month. PMH: HLD, HTN, CAD, menieres disease, B12 deficiency, suprapubic catheter.     Clinical Impressions Patient presenting with decreased Ind in self care,balance, functional mobility/transfers, endurance, and safety awareness.  Patient reports being Ind at baseline and living at home with wife. They have several family members and church friends who assist them with groceries or running errands for them.  Patient currently functioning at supervision- CGA with use of RW for functional mobility. Pt needing min A for trunk support to EOB. Pt stands at sink for grooming tasks but does fatigue quickly and returns to bed after standing for ~ 5 minutes. Patient will benefit from acute OT to increase overall independence in the areas of ADLs, functional mobility, and safety awareness in order to safely discharge.     If plan is discharge home, recommend the following:   A little help with walking and/or transfers;A little help with bathing/dressing/bathroom;Assistance with cooking/housework;Assist for transportation;Help with stairs or ramp for entrance     Functional Status Assessment   Patient has had a recent decline in their functional status and demonstrates the ability to make significant improvements in function in a reasonable and predictable amount of time.     Equipment Recommendations   Other (comment) (defer to next venue of care)      Precautions/Restrictions   Precautions Precautions: Fall     Mobility Bed Mobility Overal bed mobility: Needs Assistance Bed Mobility: Supine to Sit, Sit to Supine     Supine to sit: Min assist Sit to  supine: Supervision        Transfers Overall transfer level: Needs assistance Equipment used: Rolling walker (2 wheels) Transfers: Sit to/from Stand Sit to Stand: Contact guard assist                  Balance Overall balance assessment: Needs assistance Sitting-balance support: Feet supported Sitting balance-Leahy Scale: Good     Standing balance support: Reliant on assistive device for balance, During functional activity, Bilateral upper extremity supported Standing balance-Leahy Scale: Fair                             ADL either performed or assessed with clinical judgement   ADL Overall ADL's : Needs assistance/impaired     Grooming: Wash/dry hands;Wash/dry face;Oral care;Standing;Contact guard assist               Lower Body Dressing: Contact guard assist;Minimal assistance;Sit to/from stand                       Vision Patient Visual Report: No change from baseline              Pertinent Vitals/Pain Pain Assessment Pain Assessment: No/denies pain     Extremity/Trunk Assessment Upper Extremity Assessment Upper Extremity Assessment: Generalized weakness   Lower Extremity Assessment Lower Extremity Assessment: Defer to PT evaluation       Communication Communication Communication: Impaired Factors Affecting Communication: Hearing impaired   Cognition Arousal: Alert Behavior During Therapy: WFL for tasks assessed/performed Cognition: No apparent impairments  Following commands: Intact       Cueing  General Comments   Cueing Techniques: Verbal cues              Home Living Family/patient expects to be discharged to:: Private residence Living Arrangements: Spouse/significant other Available Help at Discharge: Family;Available PRN/intermittently Type of Home: House Home Access: Level entry     Home Layout: One level     Bathroom Shower/Tub: Company secretary: Handicapped height Bathroom Accessibility: Yes   Home Equipment: Agricultural consultant (2 wheels);Shower seat;Grab bars - toilet;Grab bars - tub/shower          Prior Functioning/Environment Prior Level of Function : Independent/Modified Independent;Driving             Mobility Comments: since prior admission, has been limited to short household distances, HHPT not started yet ADLs Comments: Pt reports being Ind at baseline for ADLs and shares IADLs with wife. Family assists with getting groceries and they eat out quite a bit.    OT Problem List: Decreased strength;Decreased activity tolerance;Decreased safety awareness;Impaired balance (sitting and/or standing);Decreased knowledge of use of DME or AE   OT Treatment/Interventions: Self-care/ADL training;Therapeutic exercise;Therapeutic activities;Energy conservation;DME and/or AE instruction;Patient/family education;Balance training      OT Goals(Current goals can be found in the care plan section)   Acute Rehab OT Goals Patient Stated Goal: to get stronger OT Goal Formulation: With patient Time For Goal Achievement: 10/15/23 Potential to Achieve Goals: Fair ADL Goals Pt Will Perform Grooming: with modified independence;standing Pt Will Perform Lower Body Dressing: with modified independence;sit to/from stand Pt Will Transfer to Toilet: with modified independence;ambulating Pt Will Perform Toileting - Clothing Manipulation and hygiene: with modified independence;sit to/from stand   OT Frequency:  Min 2X/week       AM-PAC OT "6 Clicks" Daily Activity     Outcome Measure Help from another person eating meals?: None Help from another person taking care of personal grooming?: None Help from another person toileting, which includes using toliet, bedpan, or urinal?: A Little Help from another person bathing (including washing, rinsing, drying)?: A Little Help from another person to put on and taking off regular  upper body clothing?: None Help from another person to put on and taking off regular lower body clothing?: A Little 6 Click Score: 21   End of Session Equipment Utilized During Treatment: Rolling walker (2 wheels) Nurse Communication: Mobility status;Other (comment) (cath bag emptied 300cc)  Activity Tolerance: Patient tolerated treatment well Patient left: in bed;with call bell/phone within reach;with bed alarm set  OT Visit Diagnosis: Unsteadiness on feet (R26.81);Muscle weakness (generalized) (M62.81)                Time: 1610-9604 OT Time Calculation (min): 20 min Charges:  OT General Charges $OT Visit: 1 Visit OT Evaluation $OT Eval Low Complexity: 1 Low OT Treatments $Self Care/Home Management : 8-22 mins George Kinder, MS, OTR/L , CBIS ascom [phone]  10/01/23, 11:41 AM

## 2023-10-01 NOTE — Consult Note (Signed)
 Pharmacy Antibiotic Note  Zachary [name] is a 83 y.o. male admitted on 09/30/2023 with sepsis.  Patient has history of ESBL UTI from 08/2023. Pharmacy has been consulted for vancomycin and meropenem dosing.  AKI with Scr 2.41 (Scr 1.64 08/26/23)  Vancomycin 1 grams IV x 1 given 4/29 @ 0608  Plan: Scr trending down towards baseline. Will start vancomycin 1000 mg q48H. Vancomycin random was 9 (~24 hours post dose). Will dose the vancomycin today. Scr 2.05, Vd 0.72. used TBW. Plan to obtain vancomycin levels after 3rd or 4th dose. MRSA PCR ordered.   Continue meropenem 500 mg BID.   Height: 6\' 2"  (188 cm) Weight: 56.7 kg (125 lb) IBW/kg (Calculated) : 82.2  Temp (24hrs), Avg:98.6 F (37 C), Min:98.3 F (36.8 C), Max:99.2 F (37.3 C)  Recent Labs  Lab 09/26/23 1300 09/30/23 0506 10/01/23 0429  WBC 11.4* 14.8* 8.1  CREATININE  --  2.41* 2.05*  LATICACIDVEN  --  1.4  --   VANCORANDOM  --   --  9    Estimated Creatinine Clearance: 23 mL/min (A) (by C-G formula based on SCr of 2.05 mg/dL (H)).    No Known Allergies  Antimicrobials this admission: vancomycin 4/29 >>  Meropenem 4/29 >>  Cefepime x 1 4/29 Flagyl x 1 4/29  Microbiology results: 4/29 BCx: pending 4/29 UCx: pending  4/29 MRSA PCR: ordered  Thank you for allowing pharmacy to be a part of this patient's care.  Haneefah Venturini S Tanga Gloor, PharmD 10/01/2023 7:36 AM

## 2023-10-01 NOTE — Hospital Course (Addendum)
 Hospital course / significant events:   HPI: Zachary [name] is a 83 y.o. male with medical history significant of HTN, urinary retention s/p suprapubic catheter placement,  HLD. Presents to ED from home via EMS w/ weakness.   Of note, hospitalized last month 03/04-03/07/25 w/ substantial urinary retention and AKI, Foley placed, f/u urology and suprapubic cath was placed 08/26/23. Also notable, has been following w/ PCP last visit 09/24/23 concern for unintentional weight loss, SPEP --> pending. Has been following w/ hematology last visit 09/26/23 for anemia and lung nodule, flow cytometry --> neg, CT chest --> pending   04/29: admitted to hospitalist service w/ sepsis, pna, uti, encephalopathy. ICU consult but he did not need pressors 04/30: await cultures, CT chest to eval pna/other RML. Alert today. 05/01: CT demonstrating cavitary pneumonia.  1 blood culture positive, concern for MRSA.  Patient requesting to leave AMA, see progress note and IPAL note.  Will stay at least through tonight.  ID has seen patient, considering for p.o. antibiotics in case patient is wanting to go home tomorrow 05/02: pt has reconsidered for treatment, he states he "made a mistake" yesterday and is now open to continuing IV antibiotics and going ot rehab hopefully same facility as his wife will be going to post-op. Palliative to follow either way.  05/03: repeat BCx negative thus far, continue current IV abx     Consultants:  PCCM Infectious disease Palliative care   Procedures/Surgeries: none      ASSESSMENT & PLAN:   Sepsis POA ESBL UTI Cavitary pneumonia RML MRSA bacteremia Infectious Disease following  Abx per ID, linezolid  possible if not going to continue w/ IV abx  Monitor repeat cultures   Goals of care discussion Palliative care consult to facilitate GOC discussion and plan In my judgment, patient has capacity to voice his goals for quality of life Has changed his mind from yesterday,  I do not think this indicates diminished capacity, he is within his rights to change plans/goals    Elevated troponin likely demand ischemis in setting of sepsis, less likely NSTEMI (non-ST elevated myocardial infarction)  Troponin in the 200s on presentation in setting of sepsis associated with multifocal pneumonia and UTI No active chest pain Suspect secondary demand ischemia Stage IV CKD and poor renal clearance also confounding issues Defer ASA in setting of hematuria  Monitor   CKD (chronic kidney disease) stage 4, GFR 15-29 ml/min  Creatinine 2.4 on presentation with GFR in the 20s Appears to be near baseline Monitor   Acute encephalopathy POA - resolved  Positive generalized lethargy in setting of active sepsis with recurrent multifocal pneumonia and UTI CT head within normal  Monitor   Urinary retention s/p SP cath  History of ESBL E coli infection based on culture 03/12 UA indicative of infection POA 08/2023 Urine culture showing ESBL E coli  Abx as above per ID Suprapubic catheter exchanged in the ER  Follow cultures Keep cath site clean/dry    Essential hypertension Hold BP regimen in setting hypotension assd with sepsis  Monitor     Mixed hyperlipidemia hold lipitor          underweight based on BMI: Body mass index is 16.05 kg/m.Zachary [name] Significantly low or high BMI is associated with higher medical risk.  Underweight - under 18  overweight - 25 to 29 obese - 30 or more Class 1 obesity: BMI of 30.0 to 34 Class 2 obesity: BMI of 35.0 to 39 Class 3 obesity: BMI of 40.0  to 49 Super Morbid Obesity: BMI 50-59 Super-super Morbid Obesity: BMI 60+ Healthy nutrition and physical activity advised as adjunct to other disease management and risk reduction treatments    DVT prophylaxis: lovenox   IV fluids: none at this time  Nutrition: per dietician Central lines / other devices: suprapubic cath  Code Status: DNR ACP documentation reviewed:  none on file in  [facility]  [facility] needs: SNF rehab Medical barriers to dispo: sepsis/infection awiting cultures and clinical improvement. Expected medical readiness for discharge several more days.

## 2023-10-01 NOTE — TOC Initial Note (Signed)
 Transition of Care [facility]) - Initial/Assessment Note    Patient Details  Name: Zachary [name] MRN: [id number] Date of Birth: 24-Oct-1940  Transition of Care [facility]) CM/SW Contact:    Odilia Bennett, LCSW Phone Number: 10/01/2023, 2:44 PM  Clinical Narrative:   CSW met with patient. No family at bedside. CSW introduced role and explained that therapy recommendations would be discussed. Patient is agreeable to SNF placement. First preference is [facility] due to proximity to his home. Courtney at [facility] is aware and will review. No further concerns. CSW will continue to follow patient for support and facilitate discharge to SNF once medically stable.               Expected Discharge Plan: Skilled Nursing Facility Barriers to Discharge: Continued Medical Work up   Patient Goals and CMS Choice            Expected Discharge Plan and Services     Post Acute Care Choice: Skilled Nursing Facility Living arrangements for the past 2 months: Single Family Home                                      Prior Living Arrangements/Services Living arrangements for the past 2 months: Single Family Home Lives with:: Spouse Patient language and need for interpreter reviewed:: Yes Do you feel safe going back to the place where you live?: Yes      Need for Family Participation in Patient Care: Yes (Comment) Care giver support system in place?: Yes (comment)   Criminal Activity/Legal Involvement Pertinent to Current Situation/Hospitalization: No - Comment as needed  Activities of Daily Living   ADL Screening (condition at time of admission) Independently performs ADLs?: No Does the patient have a NEW difficulty with bathing/dressing/toileting/self-feeding that is expected to last >3 days?: Yes (Initiates electronic notice to provider for possible OT consult) Does the patient have a NEW difficulty with getting in/out of bed, walking, or climbing stairs that is expected to last >3  days?: Yes (Initiates electronic notice to provider for possible PT consult) Does the patient have a NEW difficulty with communication that is expected to last >3 days?: Yes (Initiates electronic notice to provider for possible SLP consult) Is the patient deaf or have difficulty hearing?: Yes Does the patient have difficulty seeing, even when wearing glasses/contacts?: No Does the patient have difficulty concentrating, remembering, or making decisions?: No  Permission Sought/Granted Permission sought to share information with : Facility Industrial/product designer granted to share information with : Yes, Verbal Permission Granted     Permission granted to share info w AGENCY: SNF's        Emotional Assessment Appearance:: Appears stated age Attitude/Demeanor/Rapport: Engaged, Gracious Affect (typically observed): Accepting, Appropriate, Calm, Pleasant Orientation: : Oriented to Self, Oriented to Place, Oriented to  Time, Oriented to Situation Alcohol  / Substance Use: Not Applicable Psych Involvement: No (comment)  Admission diagnosis:  Elevated troponin [R79.89] Acute UTI [N39.0] Sepsis (HCC) [A41.9] Pneumonia of right upper lobe due to infectious organism [J18.9] Pneumonia of right lower lobe due to infectious organism [J18.9] Acute sepsis [facility]) [A41.9] Patient Active Problem List   Diagnosis Date Noted   Sepsis (HCC) 09/30/2023   Pneumonia 09/30/2023   UTI (urinary tract infection) 09/30/2023   Acute encephalopathy 09/30/2023   CKD (chronic kidney disease) stage 4, GFR 15-29 ml/min (HCC) 09/30/2023   NSTEMI (non-ST elevated myocardial infarction) (HCC) 09/30/2023  Normocytic anemia 09/26/2023   Weight loss 09/26/2023   Leukocytosis 09/26/2023   Acute kidney injury superimposed on chronic kidney disease (HCC) 08/05/2023   Peritonitis (HCC) 08/05/2023   Fluid overload 08/05/2023   Aneurysm of thoracic aorta (HCC) 03/13/2022   Lung nodule 03/13/2022   History of  colonic polyps    B12 deficiency anemia 08/03/2019   Elevated MCV 08/03/2019   Cyst, baker's knee, right 07/27/2019   Peripheral edema- right lower leg  07/27/2019   Coronary artery disease involving native coronary artery of native heart without angina pectoris 04/12/2019   Valvular heart disease 04/12/2019   Pneumothorax on right 05/15/2015   Benign essential tremor 02/28/2015   Mixed hyperlipidemia 02/28/2015   Essential hypertension 02/28/2015   Hypercholesterolemia without hypertriglyceridemia 02/28/2015   Vertigo 02/28/2015   PCP:  [name], MD Pharmacy:   [facility] [address] [place] - [address] [place] [address] Phone: [phone] Fax: [phone]     Social Drivers of Health (SDOH) Social History: SDOH Screenings   Food Insecurity: No Food Insecurity (09/30/2023)  Housing: Low Risk  (09/30/2023)  Transportation Needs: No Transportation Needs (09/30/2023)  Utilities: Not At Risk (09/30/2023)  Alcohol  Screen: Low Risk  (09/25/2021)  Depression (PHQ2-9): Low Risk  (09/26/2023)  Financial Resource Strain: Low Risk  (09/25/2021)  Physical Activity: Insufficiently Active (09/25/2021)  Social Connections: Moderately Isolated (09/30/2023)  Stress: No Stress Concern Present (11/24/2019)  Tobacco Use: Low Risk  (09/30/2023)   SDOH Interventions:     Readmission Risk Interventions     No data to display

## 2023-10-02 DIAGNOSIS — R7881 Bacteremia: Secondary | ICD-10-CM | POA: Diagnosis not present

## 2023-10-02 DIAGNOSIS — J15212 Pneumonia due to Methicillin resistant Staphylococcus aureus: Secondary | ICD-10-CM

## 2023-10-02 DIAGNOSIS — B9620 Unspecified Escherichia coli [E. coli] as the cause of diseases classified elsewhere: Secondary | ICD-10-CM

## 2023-10-02 DIAGNOSIS — B952 Enterococcus as the cause of diseases classified elsewhere: Secondary | ICD-10-CM

## 2023-10-02 DIAGNOSIS — N189 Chronic kidney disease, unspecified: Secondary | ICD-10-CM | POA: Diagnosis not present

## 2023-10-02 DIAGNOSIS — G9341 Metabolic encephalopathy: Secondary | ICD-10-CM | POA: Diagnosis not present

## 2023-10-02 DIAGNOSIS — R6520 Severe sepsis without septic shock: Secondary | ICD-10-CM | POA: Diagnosis not present

## 2023-10-02 DIAGNOSIS — A419 Sepsis, unspecified organism: Secondary | ICD-10-CM | POA: Diagnosis not present

## 2023-10-02 DIAGNOSIS — B9562 Methicillin resistant Staphylococcus aureus infection as the cause of diseases classified elsewhere: Secondary | ICD-10-CM | POA: Diagnosis not present

## 2023-10-02 LAB — BLOOD CULTURE ID PANEL (REFLEXED) - BCID2

## 2023-10-02 LAB — COMP PANEL: LEUKEMIA/LYMPHOMA

## 2023-10-02 LAB — SPECIMEN STATUS REPORT

## 2023-10-02 LAB — URINE CULTURE

## 2023-10-02 LAB — MRSA NEXT GEN BY PCR, NASAL: MRSA by PCR Next Gen: DETECTED — AB

## 2023-10-02 MED ORDER — LINEZOLID 600 MG PO TABS
600.0000 mg | ORAL_TABLET | Freq: Two times a day (BID) | ORAL | Status: DC
  Administered 2023-10-02 – 2023-10-03 (×2): 600 mg via ORAL
  Filled 2023-10-02 (×2): qty 1

## 2023-10-02 NOTE — Progress Notes (Signed)
 PHARMACY - PHYSICIAN COMMUNICATION CRITICAL VALUE ALERT - BLOOD CULTURE IDENTIFICATION (BCID)  Results for orders placed or performed during the hospital encounter of 09/30/23  Resp panel by RT-PCR (RSV, Flu A&B, Covid) Anterior Nasal Swab     Status: None   Collection Time: 09/30/23  5:06 AM   Specimen: Anterior Nasal Swab  Result Value Ref Range Status   SARS Coronavirus 2 by RT PCR NEGATIVE NEGATIVE Final    Comment: (NOTE) SARS-CoV-2 target nucleic acids are NOT DETECTED.  The SARS-CoV-2 RNA is generally detectable in upper respiratory specimens during the acute phase of infection. The lowest concentration of SARS-CoV-2 viral copies this assay can detect is 138 copies/mL. A negative result does not preclude SARS-Cov-2 infection and should not be used as the sole basis for treatment or other patient management decisions. A negative result may occur with  improper specimen collection/handling, submission of specimen other than nasopharyngeal swab, presence of viral mutation(s) within the areas targeted by this assay, and inadequate number of viral copies(<138 copies/mL). A negative result must be combined with clinical observations, patient history, and epidemiological information. The expected result is Negative.  Fact Sheet for Patients:  BloggerCourse.com  Fact Sheet for Healthcare Providers:  SeriousBroker.it  This test is no t yet approved or cleared by the [place]  FDA and  has been authorized for detection and/or diagnosis of SARS-CoV-2 by FDA under an Emergency Use Authorization (EUA). This EUA will remain  in effect (meaning this test can be used) for the duration of the COVID-19 declaration under Section 564(b)(1) of the Act, 21 U.S.C.section 360bbb-3(b)(1), unless the authorization is terminated  or revoked sooner.       Influenza A by PCR NEGATIVE NEGATIVE Final   Influenza B by PCR NEGATIVE NEGATIVE Final     Comment: (NOTE) The Xpert Xpress SARS-CoV-2/FLU/RSV plus assay is intended as an aid in the diagnosis of influenza from Nasopharyngeal swab specimens and should not be used as a sole basis for treatment. Nasal washings and aspirates are unacceptable for Xpert Xpress SARS-CoV-2/FLU/RSV testing.  Fact Sheet for Patients: BloggerCourse.com  Fact Sheet for Healthcare Providers: SeriousBroker.it  This test is not yet approved or cleared by the [place]  FDA and has been authorized for detection and/or diagnosis of SARS-CoV-2 by FDA under an Emergency Use Authorization (EUA). This EUA will remain in effect (meaning this test can be used) for the duration of the COVID-19 declaration under Section 564(b)(1) of the Act, 21 U.S.C. section 360bbb-3(b)(1), unless the authorization is terminated or revoked.     Resp Syncytial Virus by PCR NEGATIVE NEGATIVE Final    Comment: (NOTE) Fact Sheet for Patients: BloggerCourse.com  Fact Sheet for Healthcare Providers: SeriousBroker.it  This test is not yet approved or cleared by the [place]  FDA and has been authorized for detection and/or diagnosis of SARS-CoV-2 by FDA under an Emergency Use Authorization (EUA). This EUA will remain in effect (meaning this test can be used) for the duration of the COVID-19 declaration under Section 564(b)(1) of the Act, 21 U.S.C. section 360bbb-3(b)(1), unless the authorization is terminated or revoked.  Performed at [facility], [address], [place], [place] [address]   Blood Culture (routine x 2)     Status: None (Preliminary result)   Collection Time: 09/30/23  5:06 AM   Specimen: BLOOD  Result Value Ref Range Status   Specimen Description BLOOD LEFT [facility]  Final   Special Requests   Final    BOTTLES DRAWN AEROBIC AND ANAEROBIC  Blood Culture results may not be optimal due to an  inadequate volume of blood received in culture bottles   Culture  Setup Time   Final    GRAM POSITIVE COCCI AEROBIC BOTTLE ONLY Organism ID to follow CRITICAL RESULT CALLED TO, READ BACK BY AND VERIFIED WITH:  Danitra Payano AT 0401 10/02/23 JG Performed at [facility], [address] [place], [place] [address]    Culture PENDING  Incomplete   Report Status PENDING  Incomplete  Blood Culture (routine x 2)     Status: None (Preliminary result)   Collection Time: 09/30/23  5:06 AM   Specimen: BLOOD  Result Value Ref Range Status   Specimen Description BLOOD RIGTH [facility]  Final   Special Requests   Final    BOTTLES DRAWN AEROBIC AND ANAEROBIC Blood Culture adequate volume   Culture   Final    NO GROWTH < 24 HOURS Performed at [facility], [address] [place], [place] [address]    Report Status PENDING  Incomplete  Blood Culture ID Panel (Reflexed)     Status: Abnormal   Collection Time: 09/30/23  5:06 AM  Result Value Ref Range Status   Enterococcus faecalis NOT DETECTED NOT DETECTED Final   Enterococcus Faecium NOT DETECTED NOT DETECTED Final   Listeria monocytogenes NOT DETECTED NOT DETECTED Final   Staphylococcus species DETECTED (A) NOT DETECTED Final    Comment: CRITICAL RESULT CALLED TO, READ BACK BY AND VERIFIED WITH:  Jaleen Finch AT 0401 10/02/23 JG    Staphylococcus aureus (BCID) DETECTED (A) NOT DETECTED Final    Comment: Methicillin (oxacillin)-resistant Staphylococcus aureus (MRSA). MRSA is predictably resistant to beta-lactam antibiotics (except ceftaroline). Preferred therapy is vancomycin  unless clinically contraindicated. Patient requires contact precautions if  hospitalized. CRITICAL RESULT CALLED TO, READ BACK BY AND VERIFIED WITH:  Akilah Cureton AT 0401 10/02/23 JG    Staphylococcus epidermidis NOT DETECTED NOT DETECTED Final   Staphylococcus lugdunensis NOT DETECTED NOT DETECTED Final   Streptococcus species NOT DETECTED NOT DETECTED Final    Streptococcus agalactiae NOT DETECTED NOT DETECTED Final   Streptococcus pneumoniae NOT DETECTED NOT DETECTED Final   Streptococcus pyogenes NOT DETECTED NOT DETECTED Final   A.calcoaceticus-baumannii NOT DETECTED NOT DETECTED Final   Bacteroides fragilis NOT DETECTED NOT DETECTED Final   Enterobacterales NOT DETECTED NOT DETECTED Final   Enterobacter cloacae complex NOT DETECTED NOT DETECTED Final   Escherichia coli NOT DETECTED NOT DETECTED Final   Klebsiella aerogenes NOT DETECTED NOT DETECTED Final   Klebsiella oxytoca NOT DETECTED NOT DETECTED Final   Klebsiella pneumoniae NOT DETECTED NOT DETECTED Final   Proteus species NOT DETECTED NOT DETECTED Final   Salmonella species NOT DETECTED NOT DETECTED Final   Serratia marcescens NOT DETECTED NOT DETECTED Final   Haemophilus influenzae NOT DETECTED NOT DETECTED Final   Neisseria meningitidis NOT DETECTED NOT DETECTED Final   Pseudomonas aeruginosa NOT DETECTED NOT DETECTED Final   Stenotrophomonas maltophilia NOT DETECTED NOT DETECTED Final   Candida albicans NOT DETECTED NOT DETECTED Final   Candida auris NOT DETECTED NOT DETECTED Final   Candida glabrata NOT DETECTED NOT DETECTED Final   Candida krusei NOT DETECTED NOT DETECTED Final   Candida parapsilosis NOT DETECTED NOT DETECTED Final   Candida tropicalis NOT DETECTED NOT DETECTED Final   Cryptococcus neoformans/gattii NOT DETECTED NOT DETECTED Final   Meth resistant mecA/C and MREJ DETECTED (A) NOT DETECTED Final    Comment: CRITICAL RESULT CALLED TO, READ BACK BY AND VERIFIED WITH:  Elana Grayer E[place] AT 0401 10/02/23 JG Performed at [facility], [address] [place], [place] [address]   Urine Culture     Status: Abnormal (Preliminary result)   Collection Time: 09/30/23  5:57 AM   Specimen: Urine, Suprapubic  Result Value Ref Range Status   Specimen Description   Final    URINE, SUPRAPUBIC Performed at [facility], [address] [place], [place] [address]     Special Requests   Final    NONE Reflexed from [id number] Performed at [name], [address] [place], [place] [address]    Culture (A)  Final    >=100,000 COLONIES/mL Hillis Lu NEGATIVE RODS >=100,000 COLONIES/mL GRAM POSITIVE COCCI IDENTIFICATION AND SUSCEPTIBILITIES TO FOLLOW Performed at [facility], [address] [place], [place] [address]    Report Status PENDING  Incomplete  MRSA Next Gen by PCR, Nasal     Status: Abnormal   Collection Time: 10/01/23 10:05 PM   Specimen: Nasal Mucosa; Nasal Swab  Result Value Ref Range Status   MRSA by PCR Next Gen DETECTED (A) NOT DETECTED Final    Comment: RESULT CALLED TO, READ BACK BY AND VERIFIED WITH:  ERICKA KLARAS AT 3086 10/02/23 JG (NOTE) The GeneXpert MRSA Assay (FDA approved for NASAL specimens only), is one component of a comprehensive MRSA colonization surveillance program. It is not intended to diagnose MRSA infection nor to guide or monitor treatment for MRSA infections. Test performance is not FDA approved in patients less than 84 years old. Performed at [facility], [address], [place], [place] [address]     BCID Results: 1 (aerobic) of 4 bottles with MRSA.  Both mecA/C and MREJ detected. Pt currently on meropenem  and vancomycin .  Name of provider contacted: Louis Row, MD   Changes to prescribed antibiotics required: No changes at this time pending further lab cx results.  Coretta Dexter, PharmD, [facility] 10/02/2023 5:06 AM

## 2023-10-02 NOTE — Progress Notes (Signed)
 Full dc summary / note to follow  Pt has expressed he wants to go home, leaving against medical advice. Reason given is "I just want to see my wife."   I have educated him on risks of leaving without treatment of infections especially given (+)blood culture, (+)urinary infection w/ suprapubic cath in place, (+)new dx cavitary pneumonia. I explicitly stated he is at risk of death. He states he understands and wants to go home   He states "I'll come back if needed." He is aware that he cannot return to the floor he will have to seek evaluation in the ED   He is certain to get worse without antibiotics. He again voices understanding of all the above and repeats the above back to me. Patient has capacity to understand risks, alternatives, and consequences of leaving AMA vs staying for treatment. He has prioritized leaving to go home.

## 2023-10-02 NOTE — Plan of Care (Signed)

## 2023-10-02 NOTE — Progress Notes (Signed)
 PROGRESS NOTE    Zachary [name]   ZOX:[id number] DOB: 1940-10-10  DOA: 09/30/2023 Date of Service: 10/02/23 which is hospital day 2  PCP: [name], MD    Hospital course / significant events:   HPI: Zachary [name] is a 83 y.o. male with medical history significant of HTN, urinary retention s/p suprapubic catheter placement,  HLD. Presents to ED from home via EMS w/ weakness.   Of note, hospitalized last month 03/04-03/07/25 w/ substantial urinary retention and AKI, Foley placed, f/u urology and suprapubic cath was placed 08/26/23. Also notable, has been following w/ PCP last visit 09/24/23 concern for unintentional weight loss, SPEP --> pending. Has been following w/ hematology last visit 09/26/23 for anemia and lung nodule, flow cytometry --> neg, CT chest --> pending   04/29: admitted to hospitalist service w/ sepsis, pna, uti, encephalopathy. ICU consult but he did not need pressors 04/30: await cultures, CT chest to eval pna/other RML 05/01: CT demonstrating cavitary pneumonia.  1 blood culture positive, concern for MRSA.  Patient requesting to leave AMA, see progress note and IPAL note.  Will stay at least through tonight.  ID has seen patient, considering for p.o. antibiotics in case patient is wanting to go home tomorrow    Consultants:  PCCM  Procedures/Surgeries: none      ASSESSMENT & PLAN:   Sepsis d/t UTI, + cavitary pneumonia RML MRSA bacteremia Noted concern for multilobar pneumonia on CXR UA also indicative of infection on urine culture in setting of chronic indwelling foley  Prior history of ESBL E coli is a confounding issue  Lactate 1.4  Infectious Disease following  Abx per ID, linezolid  possible if not going to continue w/ IV abx  Monitor    Goals of care discussion See IPAL note Patient states clearly and explicitly "I am tired, I am done, I have not been able to do what I want to do, I cannot go anywhere, and cannot drive,  cannot even wash car, I cannot walk.  I just want to be done, I am ready to see my mom and my dad and my brother, I have lived a good life married to my wife for 60 years"  Palliative care consult to facilitate GOC discussion and plan In my judgment, patient has capacity to voice his goals which are to decline aggressive medical intervention, avoid hospitalization, spends what time he has left at home with his wife.  However, he does not seem to grasp that he will need significant amount of care through the dying process, which his family is unable to provide Consideration for hospice   Elevated troponin likely demand ischemis in setting of sepsis, less likely NSTEMI (non-ST elevated myocardial infarction)  Troponin in the 200s on presentation in setting of sepsis associated with multifocal pneumonia and UTI No active chest pain Suspect secondary demand ischemia Stage IV CKD and poor renal clearance also confounding issues Defer ASA in setting of hematuria  Monitor   CKD (chronic kidney disease) stage 4, GFR 15-29 ml/min  Creatinine 2.4 on presentation with GFR in the 20s Appears to be near baseline Monitor   Acute encephalopathy Positive generalized lethargy in setting of active sepsis with recurrent multifocal pneumonia and UTI CT head within normal  Monitor   Urinary retention s/p SP cath  History of ESBL E coli infection based on culture 03/12 UA indicative of infection POA 08/2023 Urine culture showing ESBL E coli  Abx as above per ID Suprapubic catheter exchanged  in the ER  Follow cultures Keep cath site clean/dry    Cavitary Pneumonia RML confirmed on CT chest  Multilobar pneumonia on presentation on CXR in the setting of sepsis  No hypoxia at present  Abx as above per ID  Follow cultures Incentive spirometry    Essential hypertension Hold BP regimen in setting hypotension assd with sepsis  Monitor     Mixed hyperlipidemia hold lipitor          underweight  based on BMI: Body mass index is 16.05 kg/m.[name] Significantly low or high BMI is associated with higher medical risk.  Underweight - under 18  overweight - 25 to 29 obese - 30 or more Class 1 obesity: BMI of 30.0 to 34 Class 2 obesity: BMI of 35.0 to 39 Class 3 obesity: BMI of 40.0 to 49 Super Morbid Obesity: BMI 50-59 Super-super Morbid Obesity: BMI 60+ Healthy nutrition and physical activity advised as adjunct to other disease management and risk reduction treatments    DVT prophylaxis: lovenox   IV fluids: LR continuous IV fluids can dc once taking po  Nutrition: per dietician Central lines / other devices: suprapubic cath  Code Status: DNR ACP documentation reviewed:  none on file in [facility]  TOC needs: TBD Medical barriers to dispo: sepsis/infection awiting cultures and clinical improvement. Expected medical readiness for discharge several more days.              Subjective / Brief ROS:  Patient reports feeling tired  See above under goals of care discussion assessment/plan, patient states he wants to go home Denies CP/SOB.  Pain controlled.  Denies new weakness.  Tolerating diet.   Family Communication: On discussion today with the patient's wife over the phone, this was done with her brother and his wife (who also help care for the patient) and at bedside w/ the patient, see IPAL note     Objective Findings:  Vitals:   10/01/23 2006 10/02/23 0017 10/02/23 0400 10/02/23 0815  BP:  (!) 96/53 (!) 92/53 101/63  Pulse:  67 66 73  Resp: 18 18    Temp:  98 F (36.7 C) 98.8 F (37.1 C) 98.1 F (36.7 C)  TempSrc:  Oral Oral   SpO2:  92% 96% 98%  Weight:      Height:        Intake/Output Summary (Last 24 hours) at 10/02/2023 1604 Last data filed at 10/02/2023 1556 Gross per 24 hour  Intake 861.42 ml  Output 1100 ml  Net -238.58 ml   Filed Weights   09/30/23 0457  Weight: 56.7 kg    Examination:  Physical Exam Constitutional:      General: He is not  in acute distress.    Appearance: He is ill-appearing (frail, thin). He is not toxic-appearing.  Cardiovascular:     Rate and Rhythm: Normal rate and regular rhythm.  Pulmonary:     Effort: Pulmonary effort is normal.     Breath sounds: Normal breath sounds.  Abdominal:     General: Bowel sounds are normal. There is no distension.  Musculoskeletal:     Right lower leg: No edema.     Left lower leg: No edema.  Skin:    Comments: Examined suprapubic cath site - skin does not appear infected but it is damp dressing  Neurological:     General: No focal deficit present.     Mental Status: He is alert and oriented to person, place, and time. Mental status is at baseline.  Psychiatric:        Mood and Affect: Mood normal.        Behavior: Behavior normal.          Scheduled Medications:   enoxaparin  (LOVENOX ) injection  30 mg Subcutaneous Q24H   feeding supplement  237 mL Oral TID BM   linezolid   600 mg Oral Q12H   multivitamin with minerals  1 tablet Oral Daily    Continuous Infusions:    PRN Medications:  acetaminophen  **OR** acetaminophen , ondansetron  **OR** ondansetron  (ZOFRAN ) IV  Antimicrobials from admission:  Anti-infectives (From admission, onward)    Start     Dose/Rate Route Frequency Ordered Stop   10/02/23 2200  linezolid  (ZYVOX ) tablet 600 mg        600 mg Oral Every 12 hours 10/02/23 1550     10/01/23 0900  vancomycin  (VANCOCIN ) IVPB 1000 mg/200 mL premix  Status:  Discontinued        1,000 mg 200 mL/hr over 60 Minutes Intravenous Every 48 hours 10/01/23 0736 10/02/23 1550   10/01/23 0800  vancomycin  variable dose per unstable renal function (pharmacist dosing)  Status:  Discontinued         Does not apply See admin instructions 09/30/23 0747 10/01/23 0738   09/30/23 1000  meropenem  (MERREM ) 500 mg in sodium chloride  0.9 % 100 mL IVPB  Status:  Discontinued        500 mg 200 mL/hr over 30 Minutes Intravenous Every 12 hours 09/30/23 0728 10/02/23 1550    09/30/23 0500  ceFEPIme  (MAXIPIME ) 2 g in sodium chloride  0.9 % 100 mL IVPB        2 g 200 mL/hr over 30 Minutes Intravenous  Once 09/30/23 0458 09/30/23 0608   09/30/23 0500  metroNIDAZOLE  (FLAGYL ) IVPB 500 mg        500 mg 100 mL/hr over 60 Minutes Intravenous  Once 09/30/23 0458 09/30/23 0826   09/30/23 0500  vancomycin  (VANCOCIN ) IVPB 1000 mg/200 mL premix        1,000 mg 200 mL/hr over 60 Minutes Intravenous  Once 09/30/23 0458 09/30/23 4098           Data Reviewed:  I have personally reviewed the following...  CBC: Recent Labs  Lab 09/26/23 1300 09/30/23 0506 10/01/23 0429  WBC 11.4* 14.8* 8.1  NEUTROABS 9.8* 12.8*  --   HGB 10.8* 9.2* 8.5*  HCT 32.0* 27.4* 25.2*  MCV 98.2 99.6 98.1  PLT 300 215 184   Basic Metabolic Panel: Recent Labs  Lab 09/30/23 0506 10/01/23 0429  NA 129* 132*  K 4.2 3.9  CL 98 104  CO2 20* 18*  GLUCOSE 135* 95  BUN 67* 59*  CREATININE 2.41* 2.05*  CALCIUM  8.7* 8.2*  MG 2.1  --    GFR: Estimated Creatinine Clearance: 23 mL/min (A) (by C-G formula based on SCr of 2.05 mg/dL (H)). Liver Function Tests: Recent Labs  Lab 09/30/23 0506 10/01/23 0429  AST 21 26  ALT 20 22  ALKPHOS 76 80  BILITOT 1.1 0.7  PROT 5.8* 4.6*  ALBUMIN 2.4* 1.7*   No results for input(s): "LIPASE", "AMYLASE" in the last 168 hours. No results for input(s): "AMMONIA" in the last 168 hours. Coagulation Profile: Recent Labs  Lab 09/30/23 0506 09/30/23 0909  INR 1.2 2.3*   Cardiac Enzymes: No results for input(s): "CKTOTAL", "CKMB", "CKMBINDEX", "TROPONINI" in the last 168 hours. BNP (last 3 results) No results for input(s): "PROBNP" in the last 8760 hours. HbA1C: No results for  input(s): "HGBA1C" in the last 72 hours. CBG: No results for input(s): "GLUCAP" in the last 168 hours. Lipid Profile: No results for input(s): "CHOL", "HDL", "LDLCALC", "TRIG", "CHOLHDL", "LDLDIRECT" in the last 72 hours. Thyroid  Function Tests: No results for  input(s): "TSH", "T4TOTAL", "FREET4", "T3FREE", "THYROIDAB" in the last 72 hours. Anemia Panel: No results for input(s): "VITAMINB12", "FOLATE", "FERRITIN", "TIBC", "IRON", "RETICCTPCT" in the last 72 hours. Most Recent Urinalysis On File:     Component Value Date/Time   COLORURINE YELLOW (A) 09/30/2023 0557   APPEARANCEUR CLOUDY (A) 09/30/2023 0557   APPEARANCEUR Hazy (A) 08/12/2023 1141   LABSPEC 1.015 09/30/2023 0557   PHURINE 5.0 09/30/2023 0557   GLUCOSEU NEGATIVE 09/30/2023 0557   HGBUR SMALL (A) 09/30/2023 0557   BILIRUBINUR NEGATIVE 09/30/2023 0557   BILIRUBINUR Negative 08/12/2023 1141   KETONESUR NEGATIVE 09/30/2023 0557   PROTEINUR 30 (A) 09/30/2023 0557   UROBILINOGEN 1.0 06/30/2019 0823   NITRITE NEGATIVE 09/30/2023 0557   LEUKOCYTESUR LARGE (A) 09/30/2023 0557   Sepsis Labs: @LABRCNTIP (procalcitonin:4,lacticidven:4) Microbiology: Recent Results (from the past 240 hours)  Resp panel by RT-PCR (RSV, Flu A&B, Covid) Anterior Nasal Swab     Status: None   Collection Time: 09/30/23  5:06 AM   Specimen: Anterior Nasal Swab  Result Value Ref Range Status   SARS Coronavirus 2 by RT PCR NEGATIVE NEGATIVE Final    Comment: (NOTE) SARS-CoV-2 target nucleic acids are NOT DETECTED.  The SARS-CoV-2 RNA is generally detectable in upper respiratory specimens during the acute phase of infection. The lowest concentration of SARS-CoV-2 viral copies this assay can detect is 138 copies/mL. A negative result does not preclude SARS-Cov-2 infection and should not be used as the sole basis for treatment or other patient management decisions. A negative result may occur with  improper specimen collection/handling, submission of specimen other than nasopharyngeal swab, presence of viral mutation(s) within the areas targeted by this assay, and inadequate number of viral copies(<138 copies/mL). A negative result must be combined with clinical observations, patient history, and  epidemiological information. The expected result is Negative.  Fact Sheet for Patients:  BloggerCourse.com  Fact Sheet for Healthcare Providers:  SeriousBroker.it  This test is no t yet approved or cleared by the [place]  FDA and  has been authorized for detection and/or diagnosis of SARS-CoV-2 by FDA under an Emergency Use Authorization (EUA). This EUA will remain  in effect (meaning this test can be used) for the duration of the COVID-19 declaration under Section 564(b)(1) of the Act, 21 U.S.C.section 360bbb-3(b)(1), unless the authorization is terminated  or revoked sooner.       Influenza A by PCR NEGATIVE NEGATIVE Final   Influenza B by PCR NEGATIVE NEGATIVE Final    Comment: (NOTE) The Xpert Xpress SARS-CoV-2/FLU/RSV plus assay is intended as an aid in the diagnosis of influenza from Nasopharyngeal swab specimens and should not be used as a sole basis for treatment. Nasal washings and aspirates are unacceptable for Xpert Xpress SARS-CoV-2/FLU/RSV testing.  Fact Sheet for Patients: BloggerCourse.com  Fact Sheet for Healthcare Providers: SeriousBroker.it  This test is not yet approved or cleared by the [place]  FDA and has been authorized for detection and/or diagnosis of SARS-CoV-2 by FDA under an Emergency Use Authorization (EUA). This EUA will remain in effect (meaning this test can be used) for the duration of the COVID-19 declaration under Section 564(b)(1) of the Act, 21 U.S.C. section 360bbb-3(b)(1), unless the authorization is terminated or revoked.     Resp Syncytial Virus  by PCR NEGATIVE NEGATIVE Final    Comment: (NOTE) Fact Sheet for Patients: BloggerCourse.com  Fact Sheet for Healthcare Providers: SeriousBroker.it  This test is not yet approved or cleared by the [place]  FDA and has been  authorized for detection and/or diagnosis of SARS-CoV-2 by FDA under an Emergency Use Authorization (EUA). This EUA will remain in effect (meaning this test can be used) for the duration of the COVID-19 declaration under Section 564(b)(1) of the Act, 21 U.S.C. section 360bbb-3(b)(1), unless the authorization is terminated or revoked.  Performed at [facility], [address] [place], [place] [address]   Blood Culture (routine x 2)     Status: None (Preliminary result)   Collection Time: 09/30/23  5:06 AM   Specimen: Left Antecubital; Blood  Result Value Ref Range Status   Specimen Description   Final    LEFT ANTECUBITAL Performed at [facility], [address], [place], [place] [address]    Special Requests   Final    BOTTLES DRAWN AEROBIC AND ANAEROBIC Blood Culture results may not be optimal due to an inadequate volume of blood received in culture bottles Performed at [facility], [address] [place], [place] [address]    Culture  Setup Time   Final    GRAM POSITIVE COCCI AEROBIC BOTTLE ONLY Organism ID to follow CRITICAL RESULT CALLED TO, READ BACK BY AND VERIFIED WITH:  NATHAN BELUE AT 0401 10/02/23 JG Performed at [facility], [address], [place], [place] [address]    Culture GRAM POSITIVE COCCI  Final   Report Status PENDING  Incomplete  Blood Culture (routine x 2)     Status: None (Preliminary result)   Collection Time: 09/30/23  5:06 AM   Specimen: BLOOD  Result Value Ref Range Status   Specimen Description BLOOD RIGTH [facility]  Final   Special Requests   Final    BOTTLES DRAWN AEROBIC AND ANAEROBIC Blood Culture adequate volume   Culture   Final    NO GROWTH 2 DAYS Performed at [facility], [address] [place], [place] [address]    Report Status PENDING  Incomplete  Blood Culture ID Panel (Reflexed)     Status: Abnormal   Collection Time: 09/30/23  5:06 AM  Result Value Ref Range Status   Enterococcus  faecalis NOT DETECTED NOT DETECTED Final   Enterococcus Faecium NOT DETECTED NOT DETECTED Final   Listeria monocytogenes NOT DETECTED NOT DETECTED Final   Staphylococcus species DETECTED (A) NOT DETECTED Final    Comment: CRITICAL RESULT CALLED TO, READ BACK BY AND VERIFIED WITH:  NATHAN BELUE AT 0401 10/02/23 JG    Staphylococcus aureus (BCID) DETECTED (A) NOT DETECTED Final    Comment: Methicillin (oxacillin)-resistant Staphylococcus aureus (MRSA). MRSA is predictably resistant to beta-lactam antibiotics (except ceftaroline). Preferred therapy is vancomycin  unless clinically contraindicated. Patient requires contact precautions if  hospitalized. CRITICAL RESULT CALLED TO, READ BACK BY AND VERIFIED WITH:  NATHAN BELUE AT 0401 10/02/23 JG    Staphylococcus epidermidis NOT DETECTED NOT DETECTED Final   Staphylococcus lugdunensis NOT DETECTED NOT DETECTED Final   Streptococcus species NOT DETECTED NOT DETECTED Final   Streptococcus agalactiae NOT DETECTED NOT DETECTED Final   Streptococcus pneumoniae NOT DETECTED NOT DETECTED Final   Streptococcus pyogenes NOT DETECTED NOT DETECTED Final   A.calcoaceticus-baumannii NOT DETECTED NOT DETECTED Final   Bacteroides fragilis NOT DETECTED NOT DETECTED Final   Enterobacterales NOT DETECTED NOT DETECTED Final   Enterobacter cloacae complex  NOT DETECTED NOT DETECTED Final   Escherichia coli NOT DETECTED NOT DETECTED Final   Klebsiella aerogenes NOT DETECTED NOT DETECTED Final   Klebsiella oxytoca NOT DETECTED NOT DETECTED Final   Klebsiella pneumoniae NOT DETECTED NOT DETECTED Final   Proteus species NOT DETECTED NOT DETECTED Final   Salmonella species NOT DETECTED NOT DETECTED Final   Serratia marcescens NOT DETECTED NOT DETECTED Final   Haemophilus influenzae NOT DETECTED NOT DETECTED Final   Neisseria meningitidis NOT DETECTED NOT DETECTED Final   Pseudomonas aeruginosa NOT DETECTED NOT DETECTED Final   Stenotrophomonas maltophilia NOT DETECTED  NOT DETECTED Final   Candida albicans NOT DETECTED NOT DETECTED Final   Candida auris NOT DETECTED NOT DETECTED Final   Candida glabrata NOT DETECTED NOT DETECTED Final   Candida krusei NOT DETECTED NOT DETECTED Final   Candida parapsilosis NOT DETECTED NOT DETECTED Final   Candida tropicalis NOT DETECTED NOT DETECTED Final   Cryptococcus neoformans/gattii NOT DETECTED NOT DETECTED Final   Meth resistant mecA/C and MREJ DETECTED (A) NOT DETECTED Final    Comment: CRITICAL RESULT CALLED TO, READ BACK BY AND VERIFIED WITHNoe Bath AT 0401 10/02/23 JG Performed at [facility], [address] [place], [place] [address]   Urine Culture     Status: Abnormal   Collection Time: 09/30/23  5:57 AM   Specimen: Urine, Suprapubic  Result Value Ref Range Status   Specimen Description   Final    URINE, SUPRAPUBIC Performed at [facility], [address], [place], [place] [address]    Special Requests   Final    NONE Reflexed from [phone] Performed at [facility], [address] [place], [place] [address]    Culture (A)  Final    >=100,000 COLONIES/mL ESCHERICHIA COLI Confirmed Extended Spectrum Beta-Lactamase Producer (ESBL).  In bloodstream infections from ESBL organisms, carbapenems are preferred over piperacillin/tazobactam. They are shown to have a lower risk of mortality. >=100,000 COLONIES/mL ENTEROCOCCUS FAECALIS    Report Status 10/02/2023 FINAL  Final   Organism ID, Bacteria ESCHERICHIA COLI (A)  Final   Organism ID, Bacteria ENTEROCOCCUS FAECALIS (A)  Final      Susceptibility   Escherichia coli - MIC*    AMPICILLIN  >=32 RESISTANT Resistant     CEFAZOLIN >=64 RESISTANT Resistant     CEFEPIME  8 INTERMEDIATE Intermediate     CEFTRIAXONE  >=64 RESISTANT Resistant     CIPROFLOXACIN  >=4 RESISTANT Resistant     GENTAMICIN <=1 SENSITIVE Sensitive     IMIPENEM 0.5 SENSITIVE Sensitive     NITROFURANTOIN <=16 SENSITIVE Sensitive     TRIMETH /SULFA  >=320  RESISTANT Resistant     AMPICILLIN /SULBACTAM 4 SENSITIVE Sensitive     PIP/TAZO <=4 SENSITIVE Sensitive ug/mL    * >=100,000 COLONIES/mL ESCHERICHIA COLI   Enterococcus faecalis - MIC*    AMPICILLIN  <=2 SENSITIVE Sensitive     NITROFURANTOIN <=16 SENSITIVE Sensitive     VANCOMYCIN  1 SENSITIVE Sensitive     * >=100,000 COLONIES/mL ENTEROCOCCUS FAECALIS  MRSA Next Gen by PCR, Nasal     Status: Abnormal   Collection Time: 10/01/23 10:05 PM   Specimen: Nasal Mucosa; Nasal Swab  Result Value Ref Range Status   MRSA by PCR Next Gen DETECTED (A) NOT DETECTED Final    Comment: RESULT CALLED TO, READ BACK BY AND VERIFIED WITH:  ERICKA KLARAS AT 0048 10/02/23 JG (NOTE) The GeneXpert MRSA Assay (FDA approved for NASAL specimens only), is one component of a comprehensive MRSA colonization  surveillance program. It is not intended to diagnose MRSA infection nor to guide or monitor treatment for MRSA infections. Test performance is not FDA approved in patients less than 69 years old. Performed at [facility], [address] [place], [place] [address]       Radiology Studies last 3 days: CT CHEST WO CONTRAST Result Date: 10/01/2023 CLINICAL DATA:  Weakness, fever, abnormal x-ray EXAM: CT CHEST WITHOUT CONTRAST TECHNIQUE: Multidetector CT imaging of the chest was performed following the standard protocol without IV contrast. RADIATION DOSE REDUCTION: This exam was performed according to the departmental dose-optimization program which includes automated exposure control, adjustment of the mA and/or kV according to patient size and/or use of iterative reconstruction technique. COMPARISON:  03/04/2022, 08/08/2023, 09/30/2023 FINDINGS: Cardiovascular: Unenhanced imaging of the heart is unremarkable, without significant pericardial effusion. 4.5 cm ascending thoracic aortic aneurysm. Atherosclerosis of the aorta and coronary vasculature. Assessment of the vascular lumen cannot be performed without  intravenous contrast. Mediastinum/Nodes: No enlarged mediastinal or axillary lymph nodes. Thyroid  gland, trachea, and esophagus demonstrate no significant findings. Moderate hiatal hernia. Lungs/Pleura: There is a thick walled right upper lobe cavity measuring 9.2 x 7.2 x 7.5 cm, with gas fluid level. There is surrounding dense airspace disease within the right upper lobe as well as within the right middle lobe. Findings are most consistent with cavitary pneumonia, new since the 08/08/2023 CT. There are small free-flowing bilateral pleural effusions, right greater than left. Dependent consolidation within the lower lobes most consistent with atelectasis. No pneumothorax. Central airways are patent. Upper Abdomen: Upper abdominal ascites. Musculoskeletal: No acute or destructive bony abnormalities. Reconstructed images demonstrate no additional findings. IMPRESSION: 1. Large thick walled right upper lobe cavity with gas fluid level, with dense airspace disease involving the right upper and right middle lobes, most consistent with cavitating pneumonia. This has developed since the abdominal CT 08/08/2023. 2. Small free-flowing bilateral pleural effusions. 3. Moderate hiatal hernia. 4. 4.5 cm ascending thoracic aortic aneurysm. Ascending thoracic aortic aneurysm. Recommend semi-annual imaging followup by CTA or MRA and referral to cardiothoracic surgery if not already obtained. This recommendation follows 2010 ACCF/AHA/AATS/ACR/ASA/SCA/SCAI/SIR/STS/SVM Guidelines for the Diagnosis and Management of Patients With Thoracic Aortic Disease. Circulation. 2010; 121: U045-W098. Aortic aneurysm NOS (ICD10-I71.9) 5.  Aortic Atherosclerosis (ICD10-I70.0). 6. Upper abdominal ascites. Electronically Signed   By: Bobbye Burrow M.D.   On: 10/01/2023 20:12   DG Chest Port 1 View Result Date: 09/30/2023 CLINICAL DATA:  83 year old male with possible sepsis. EXAM: PORTABLE CHEST 1 VIEW COMPARISON:  Chest CT 03/04/2022 and earlier.  FINDINGS: Portable AP supine views at 0600 hours. Chronic retrocardiac moderate to large gastric hiatal hernia. Stable cardiac size and mediastinal contours. Chronic pulmonary hyperinflation. Patchy and indistinct new multifocal right lung opacity in both the upper and lower lung. No superimposed pneumothorax, pulmonary edema, pleural effusion. No definite left lung involvement. No acute osseous abnormality identified. Nondilated gas-filled bowel in the upper abdomen. IMPRESSION: 1. Chronic pulmonary hyperinflation with indistinct new multifocal right lung opacity suspicious for developing multilobar bronchopneumonia/pneumonia in this setting. No pleural effusion. 2. Chronic moderate to large hiatal hernia. Electronically Signed   By: Marlise Simpers M.D.   On: 09/30/2023 06:25   CT Cervical Spine Wo Contrast Result Date: 09/30/2023 CLINICAL DATA:  83 year old male status post fall in garage ester day. Generalized weakness, decreased p.o. Hematuria. Shortness of breath. EXAM: CT CERVICAL SPINE WITHOUT CONTRAST TECHNIQUE: Multidetector CT imaging of the cervical spine was performed without intravenous contrast. Multiplanar CT image reconstructions  were also generated. RADIATION DOSE REDUCTION: This exam was performed according to the departmental dose-optimization program which includes automated exposure control, adjustment of the mA and/or kV according to patient size and/or use of iterative reconstruction technique. COMPARISON:  Head CT today. FINDINGS: Alignment: Maintained cervical lordosis. Cervicothoracic junction alignment is within normal limits. Bilateral posterior element alignment is within normal limits. Skull base and vertebrae: Bone mineralization is within normal limits for age. Visualized skull base is intact. No atlanto-occipital dissociation. C1 and C2 appear intact and aligned. No acute osseous abnormality identified. Soft tissues and spinal canal: No prevertebral fluid or swelling. No visible canal  hematoma. Bulky calcified carotid atherosclerosis in the neck, severe on the right (series 3, image 58). Disc levels: Generally mild for age cervical spine degeneration and capacious CT appearance of the cervical spinal canal at most levels. Upper chest: Visible upper thoracic levels appear intact. Clear lung apices with questionable emphysema. Trace intravenous gas at the thoracic inlet, likely IV access related. IMPRESSION: 1. No acute traumatic injury identified in the cervical spine. Mild for age cervical spine degeneration. 2. Calcified carotid atherosclerosis in the neck, Severe at the Right ICA. Electronically Signed   By: Marlise Simpers M.D.   On: 09/30/2023 05:57   CT HEAD WO CONTRAST ( ) Result Date: 09/30/2023 CLINICAL DATA:  83 year old male status post fall in garage ester day. Generalized weakness, decreased p.o. Hematuria. Shortness of breath. EXAM: CT HEAD WITHOUT CONTRAST TECHNIQUE: Contiguous axial images were obtained from the base of the skull through the vertex without intravenous contrast. RADIATION DOSE REDUCTION: This exam was performed according to the departmental dose-optimization program which includes automated exposure control, adjustment of the mA and/or kV according to patient size and/or use of iterative reconstruction technique. COMPARISON:  Brain MRI 04/12/2020.  Head CT 01/20/2017. FINDINGS: Brain: Cerebral volume stable and within normal limits for age. Choroid plexus cysts, normal variant. No midline shift, ventriculomegaly, mass effect, evidence of mass lesion, intracranial hemorrhage or evidence of cortically based acute infarction. Gray-white differentiation stable and within normal limits for age. Vascular: Advanced calcified atherosclerosis at the skull base. No suspicious intracranial vascular hyperdensity. Skull: Stable, intact. Sinuses/Orbits: Visualized paranasal sinuses and mastoids are clear. Other: No acute orbit or scalp soft tissue injury identified. Stable  postoperative changes to the globes. IMPRESSION: 1. No acute intracranial abnormality or acute traumatic injury identified. 2. Stable and negative for age noncontrast CT appearance of the brain. Electronically Signed   By: Marlise Simpers M.D.   On: 09/30/2023 05:55       Time spent: 50 min     Melodi Sprung, DO [facility] 10/02/2023, 4:04 PM    Dictation software may have been used to generate the above note. Typos may occur and escape review in typed/dictated notes. Please contact Dr Authur Leghorn directly for clarity if needed.  Staff may message me via secure chat in Epic  but this may not receive an immediate response,  please page me for urgent matters!  If 7PM-7AM, please contact night coverage [email or web address]

## 2023-10-02 NOTE — Consult Note (Signed)
 NAME: Zachary [name]  DOB: [birth year]  MRN: [id number]  Date/Time: 10/02/2023 1:55 PM  REQUESTING PROVIDER: Dr.Alexander Subjective:  REASON FOR CONSULT: MRSA bacteremia ? Zachary [name] is a 83 y.o. male with a history of COPD, hematuria urinary retention /bladder distension  with b/l hydronephrosis in march s/p SPC on 3/25 presents from home with generalized weakness, , poor Po intake and had fallen the day before but refused to hae EMS called , lives with his wife presented from home on 09/2923. EMS found his temp to be 100.7  09/30/23 04:56  BP 97/63  Pulse Rate 79  Resp 26 !  SpO2 100 %   Temp 98.1  Labs  Latest Reference Range & Units 09/30/23 05:06  WBC 4.0 - 10.5 K/uL 14.8 (H)  Hemoglobin 13.0 - 17.0 g/dL 9.2 (L)  HCT 08.6 - 57.8 % 27.4 (L)  Platelets 150 - 400 K/uL 215  Creatinine 0.61 - 1.24 mg/dL 4.69 (H)  Blood culture sent and I am seeing the patient as it is MRSA UC has ben ESBL ecoli and enterococcus Pt is on vanco and meropenem  Pt is saying he wants to go home When asked about it he says he wants to go home and see his wife He then says he is tired of hospital and various treatment and wants to go home and be in bed . He says he has lost 40 pounds in the past few weeks  Past Medical History:  Diagnosis Date   B12 deficiency anemia 08/03/2019   Hypercholesterolemia    Hypertension    Vertigo    last episode approx 09/2018    Past Surgical History:  Procedure Laterality Date   APPENDECTOMY     CATARACT EXTRACTION Bilateral    COLONOSCOPY WITH PROPOFOL  N/A 12/24/2018   Procedure: COLONOSCOPY WITH PROPOFOL ;  Surgeon: Marnee Sink, MD;  Location: [facility];  Service: Endoscopy;  Laterality: N/A;   COLONOSCOPY WITH PROPOFOL  N/A 12/13/2019   Procedure: COLONOSCOPY WITH BIOPSY ;  Surgeon: Marnee Sink, MD;  Location: [facility];  Service: Endoscopy;  Laterality: N/A;  priority 3   IR CYSTOSTOMY TUBE PLACEMENT/BLADDER ASPIRATION  08/26/2023    PLEURAL SCARIFICATION  05/2015   POLYPECTOMY N/A 12/13/2019   Procedure: POLYPECTOMY;  Surgeon: Marnee Sink, MD;  Location: [facility];  Service: Endoscopy;  Laterality: N/A;    Social History   Socioeconomic History   Marital status: Married    Spouse name: Not on file   Number of children: 0   Years of education: Not on file   Highest education level: 12th grade  Occupational History   Occupation: retired  Tobacco Use   Smoking status: Never   Smokeless tobacco: Never  Vaping Use   Vaping status: Never Used  Substance and Sexual Activity   Alcohol  use: No    Alcohol /week: 0.0 standard drinks of alcohol    Drug use: No   Sexual activity: Not on file  Other Topics Concern   Not on file  Social History Narrative   Lives with [name], wife.    Social Drivers of Corporate investment banker Strain: Low Risk  (09/25/2021)   Overall Financial Resource Strain (CARDIA)    Difficulty of Paying Living Expenses: Not hard at all  Food Insecurity: No Food Insecurity (09/30/2023)   Hunger Vital Sign    Worried About Running Out of Food in the Last Year: Never true    Ran Out of Food in the Last Year: Never true  Transportation Needs: No Transportation Needs (09/30/2023)   PRAPARE - Administrator, Civil Service (Medical): No    Lack of Transportation (Non-Medical): No  Physical Activity: Insufficiently Active (09/25/2021)   Exercise Vital Sign    Days of Exercise per Week: 2 days    Minutes of Exercise per Session: 60 min  Stress: No Stress Concern Present (11/24/2019)   [name]    Feeling of Stress : Not at all  Social Connections: Moderately Isolated (09/30/2023)   Social Connection and Isolation Panel [NHANES]    Frequency of Communication with Friends and Family: Twice a week    Frequency of Social Gatherings with Friends and Family: More than three times a week    Attends Religious Services:  Never    Database administrator or Organizations: No    Attends Banker Meetings: Never    Marital Status: Married  Catering manager Violence: Not At Risk (09/30/2023)   Humiliation, Afraid, Rape, and Kick questionnaire    Fear of Current or Ex-Partner: No    Emotionally Abused: No    Physically Abused: No    Sexually Abused: No    Family History  Problem Relation Age of Onset   Hypertension Mother    Hyperlipidemia Mother    Heart attack Father    Hypertension Father    CVA Father    ALS Brother    Prostate cancer Brother    No Known Allergies I? Current Facility-Administered Medications  Medication Dose Route Frequency Provider Last Rate Last Admin   acetaminophen  (TYLENOL ) tablet 650 mg  650 mg Oral Q6H PRN Corrinne Din, MD       Or   acetaminophen  (TYLENOL ) suppository 650 mg  650 mg Rectal Q6H PRN Newton, Steven J, MD       enoxaparin  (LOVENOX ) injection 30 mg  30 mg Subcutaneous Q24H Kluttz, Lisa G, RPH   30 mg at 10/01/23 2152   feeding supplement (ENSURE ENLIVE / ENSURE PLUS) liquid 237 mL  237 mL Oral TID BM Melodi Sprung, DO   237 mL at 10/02/23 1025   meropenem  (MERREM ) 500 mg in sodium chloride  0.9 % 100 mL IVPB  500 mg Intravenous Q12H Ramonita Burow, RPH 200 mL/hr at 10/02/23 1023 500 mg at 10/02/23 1023   multivitamin with minerals tablet 1 tablet  1 tablet Oral Daily Alexander, Natalie, DO   1 tablet at 10/02/23 1019   ondansetron  (ZOFRAN ) tablet 4 mg  4 mg Oral Q6H PRN Corrinne Din, MD       Or   ondansetron  (ZOFRAN ) injection 4 mg  4 mg Intravenous Q6H PRN Newton, Steven J, MD       vancomycin  (VANCOCIN ) IVPB 1000 mg/200 mL premix  1,000 mg Intravenous Q48H Patel, Kishan S, RPH 200 mL/hr at 10/01/23 0949 1,000 mg at 10/01/23 0949     Abtx:  Anti-infectives (From admission, onward)    Start     Dose/Rate Route Frequency Ordered Stop   10/01/23 0900  vancomycin  (VANCOCIN ) IVPB 1000 mg/200 mL premix        1,000 mg 200 mL/hr  over 60 Minutes Intravenous Every 48 hours 10/01/23 0736     10/01/23 0800  vancomycin  variable dose per unstable renal function (pharmacist dosing)  Status:  Discontinued         Does not apply See admin instructions 09/30/23 0747 10/01/23 0738   09/30/23 1000  meropenem  (MERREM ) 500  mg in sodium chloride  0.9 % 100 mL IVPB        500 mg 200 mL/hr over 30 Minutes Intravenous Every 12 hours 09/30/23 0728     09/30/23 0500  ceFEPIme  (MAXIPIME ) 2 g in sodium chloride  0.9 % 100 mL IVPB        2 g 200 mL/hr over 30 Minutes Intravenous  Once 09/30/23 0458 09/30/23 0608   09/30/23 0500  metroNIDAZOLE  (FLAGYL ) IVPB 500 mg        500 mg 100 mL/hr over 60 Minutes Intravenous  Once 09/30/23 0458 09/30/23 0826   09/30/23 0500  vancomycin  (VANCOCIN ) IVPB 1000 mg/200 mL premix        1,000 mg 200 mL/hr over 60 Minutes Intravenous  Once 09/30/23 0458 09/30/23 0728       REVIEW OF SYSTEMS:  Const: fever, negative chills, weight loss Eyes: negative diplopia or visual changes, negative eye pain ENT: negative coryza, negative sore throat Resp:  cough,  dyspnea Cards: negative for chest pain, palpitations, rt lower extremity edema GU: negative for frequency, dysuria and hematuria GI: Negative for abdominal pain, diarrhea, bleeding, constipation Skin: negative for rash and pruritus Heme: negative for easy bruising and gum/nose bleeding MS: weakness Neurolo:negative for headaches, + dizziness, vertigo, memory problems  Psych: depression  Endocrine: negative for thyroid , diabetes Allergy/Immunology- negative for any medication or food allergies ?  Objective:  VITALS:  BP 101/63 (BP Location: Left Arm)   Pulse 73   Temp 98.1 F (36.7 C)   Resp 18   Ht 6\' 2"  (1.88 m)   Wt 56.7 kg   SpO2 98%   BMI 16.05 kg/m  LDA SPC PHYSICAL EXAM:  General: Alert, cooperative, no distress, appears stated age. emaciated Head: Normocephalic, without obvious abnormality, atraumatic. Eyes: Conjunctivae clear,  anicteric sclerae. Pupils are equal ENT Nares normal. No drainage or sinus tenderness. Lips, mucosa, and tongue normal. No Thrush Neck: Supple, symmetrical, no adenopathy, thyroid : non tender no carotid bruit and no JVD. Back: No CVA tenderness. Lungs: b/l air entry Heart: Tachycardia Abdomen: Soft, non-tender,not distended. Bowel sounds normal. No masses Extremities: rt leg edema Skin: No rashes or lesions. Or bruising Lymph: Cervical, supraclavicular normal. Neurologic: Grossly non-focal Pertinent Labs Lab Results CBC    Component Value Date/Time   WBC 8.1 10/01/2023 0429   RBC 2.57 (L) 10/01/2023 0429   HGB 8.5 (L) 10/01/2023 0429   HGB 13.6 02/05/2022 1500   HCT 25.2 (L) 10/01/2023 0429   HCT 38.4 02/05/2022 1500   PLT 184 10/01/2023 0429   PLT 271 02/05/2022 1500   MCV 98.1 10/01/2023 0429   MCV 94 02/05/2022 1500   MCH 33.1 10/01/2023 0429   MCHC 33.7 10/01/2023 0429   RDW 14.5 10/01/2023 0429   RDW 11.7 02/05/2022 1500   LYMPHSABS 0.3 (L) 09/30/2023 0506   LYMPHSABS 0.9 02/05/2022 1500   MONOABS 0.9 09/30/2023 0506   EOSABS 0.0 09/30/2023 0506   EOSABS 0.1 02/05/2022 1500   BASOSABS 0.0 09/30/2023 0506   BASOSABS 0.1 02/05/2022 1500       Latest Ref Rng & Units 10/01/2023    4:29 AM 09/30/2023    5:06 AM 08/26/2023    9:13 AM  CMP  Glucose 70 - 99 mg/dL 95  161  80   BUN 8 - 23 mg/dL 59  67  25   Creatinine 0.61 - 1.24 mg/dL 0.96  0.45  4.09   Sodium 135 - 145 mmol/L 132  129  138  Potassium 3.5 - 5.1 mmol/L 3.9  4.2  4.0   Chloride 98 - 111 mmol/L 104  98  109   CO2 22 - 32 mmol/L 18  20  21    Calcium  8.9 - 10.3 mg/dL 8.2  8.7  8.8   Total Protein 6.5 - 8.1 g/dL 4.6  5.8    Total Bilirubin 0.0 - 1.2 mg/dL 0.7  1.1    Alkaline Phos 38 - 126 U/L 80  76    AST 15 - 41 U/L 26  21    ALT 0 - 44 U/L 22  20        Microbiology: Recent Results (from the past 240 hours)  Resp panel by RT-PCR (RSV, Flu A&B, Covid) Anterior Nasal Swab     Status: None    Collection Time: 09/30/23  5:06 AM   Specimen: Anterior Nasal Swab  Result Value Ref Range Status   SARS Coronavirus 2 by RT PCR NEGATIVE NEGATIVE Final    Comment: (NOTE) SARS-CoV-2 target nucleic acids are NOT DETECTED.  The SARS-CoV-2 RNA is generally detectable in upper respiratory specimens during the acute phase of infection. The lowest concentration of SARS-CoV-2 viral copies this assay can detect is 138 copies/mL. A negative result does not preclude SARS-Cov-2 infection and should not be used as the sole basis for treatment or other patient management decisions. A negative result may occur with  improper specimen collection/handling, submission of specimen other than nasopharyngeal swab, presence of viral mutation(s) within the areas targeted by this assay, and inadequate number of viral copies(<138 copies/mL). A negative result must be combined with clinical observations, patient history, and epidemiological information. The expected result is Negative.  Fact Sheet for Patients:  BloggerCourse.com  Fact Sheet for Healthcare Providers:  SeriousBroker.it  This test is no t yet approved or cleared by the [place]  FDA and  has been authorized for detection and/or diagnosis of SARS-CoV-2 by FDA under an Emergency Use Authorization (EUA). This EUA will remain  in effect (meaning this test can be used) for the duration of the COVID-19 declaration under Section 564(b)(1) of the Act, 21 U.S.C.section 360bbb-3(b)(1), unless the authorization is terminated  or revoked sooner.       Influenza A by PCR NEGATIVE NEGATIVE Final   Influenza B by PCR NEGATIVE NEGATIVE Final    Comment: (NOTE) The Xpert Xpress SARS-CoV-2/FLU/RSV plus assay is intended as an aid in the diagnosis of influenza from Nasopharyngeal swab specimens and should not be used as a sole basis for treatment. Nasal washings and aspirates are unacceptable for  Xpert Xpress SARS-CoV-2/FLU/RSV testing.  Fact Sheet for Patients: BloggerCourse.com  Fact Sheet for Healthcare Providers: SeriousBroker.it  This test is not yet approved or cleared by the [place]  FDA and has been authorized for detection and/or diagnosis of SARS-CoV-2 by FDA under an Emergency Use Authorization (EUA). This EUA will remain in effect (meaning this test can be used) for the duration of the COVID-19 declaration under Section 564(b)(1) of the Act, 21 U.S.C. section 360bbb-3(b)(1), unless the authorization is terminated or revoked.     Resp Syncytial Virus by PCR NEGATIVE NEGATIVE Final    Comment: (NOTE) Fact Sheet for Patients: BloggerCourse.com  Fact Sheet for Healthcare Providers: SeriousBroker.it  This test is not yet approved or cleared by the [place]  FDA and has been authorized for detection and/or diagnosis of SARS-CoV-2 by FDA under an Emergency Use Authorization (EUA). This EUA will remain in effect (meaning this test can be used)  for the duration of the COVID-19 declaration under Section 564(b)(1) of the Act, 21 U.S.C. section 360bbb-3(b)(1), unless the authorization is terminated or revoked.  Performed at [facility], [address] [place], [place] [address]   Blood Culture (routine x 2)     Status: None (Preliminary result)   Collection Time: 09/30/23  5:06 AM   Specimen: Left Antecubital; Blood  Result Value Ref Range Status   Specimen Description   Final    LEFT ANTECUBITAL Performed at [facility], [address] [place], [place] [address]    Special Requests   Final    BOTTLES DRAWN AEROBIC AND ANAEROBIC Blood Culture results may not be optimal due to an inadequate volume of blood received in culture bottles Performed at [facility], [address] [place], [place] [address]    Culture  Setup Time    Final    GRAM POSITIVE COCCI AEROBIC BOTTLE ONLY Organism ID to follow CRITICAL RESULT CALLED TO, READ BACK BY AND VERIFIED WITH:  NATHAN BELUE AT 0401 10/02/23 JG Performed at [facility], [address] [place], [place] [address]    Culture GRAM POSITIVE COCCI  Final   Report Status PENDING  Incomplete  Blood Culture (routine x 2)     Status: None (Preliminary result)   Collection Time: 09/30/23  5:06 AM   Specimen: BLOOD  Result Value Ref Range Status   Specimen Description BLOOD RIGTH [facility]  Final   Special Requests   Final    BOTTLES DRAWN AEROBIC AND ANAEROBIC Blood Culture adequate volume   Culture   Final    NO GROWTH 2 DAYS Performed at [facility], [address] [place], [place] [address]    Report Status PENDING  Incomplete  Blood Culture ID Panel (Reflexed)     Status: Abnormal   Collection Time: 09/30/23  5:06 AM  Result Value Ref Range Status   Enterococcus faecalis NOT DETECTED NOT DETECTED Final   Enterococcus Faecium NOT DETECTED NOT DETECTED Final   Listeria monocytogenes NOT DETECTED NOT DETECTED Final   Staphylococcus species DETECTED (A) NOT DETECTED Final    Comment: CRITICAL RESULT CALLED TO, READ BACK BY AND VERIFIED WITH:  NATHAN BELUE AT 0401 10/02/23 JG    Staphylococcus aureus (BCID) DETECTED (A) NOT DETECTED Final    Comment: Methicillin (oxacillin)-resistant Staphylococcus aureus (MRSA). MRSA is predictably resistant to beta-lactam antibiotics (except ceftaroline). Preferred therapy is vancomycin  unless clinically contraindicated. Patient requires contact precautions if  hospitalized. CRITICAL RESULT CALLED TO, READ BACK BY AND VERIFIED WITH:  NATHAN BELUE AT 0401 10/02/23 JG    Staphylococcus epidermidis NOT DETECTED NOT DETECTED Final   Staphylococcus lugdunensis NOT DETECTED NOT DETECTED Final   Streptococcus species NOT DETECTED NOT DETECTED Final   Streptococcus agalactiae NOT DETECTED NOT DETECTED Final   Streptococcus  pneumoniae NOT DETECTED NOT DETECTED Final   Streptococcus pyogenes NOT DETECTED NOT DETECTED Final   A.calcoaceticus-baumannii NOT DETECTED NOT DETECTED Final   Bacteroides fragilis NOT DETECTED NOT DETECTED Final   Enterobacterales NOT DETECTED NOT DETECTED Final   Enterobacter cloacae complex NOT DETECTED NOT DETECTED Final   Escherichia coli NOT DETECTED NOT DETECTED Final   Klebsiella aerogenes NOT DETECTED NOT DETECTED Final   Klebsiella oxytoca NOT DETECTED NOT DETECTED Final   Klebsiella pneumoniae NOT DETECTED NOT DETECTED Final   Proteus species NOT DETECTED NOT DETECTED Final   Salmonella species NOT DETECTED NOT DETECTED Final   Serratia marcescens NOT DETECTED NOT  DETECTED Final   Haemophilus influenzae NOT DETECTED NOT DETECTED Final   Neisseria meningitidis NOT DETECTED NOT DETECTED Final   Pseudomonas aeruginosa NOT DETECTED NOT DETECTED Final   Stenotrophomonas maltophilia NOT DETECTED NOT DETECTED Final   Candida albicans NOT DETECTED NOT DETECTED Final   Candida auris NOT DETECTED NOT DETECTED Final   Candida glabrata NOT DETECTED NOT DETECTED Final   Candida krusei NOT DETECTED NOT DETECTED Final   Candida parapsilosis NOT DETECTED NOT DETECTED Final   Candida tropicalis NOT DETECTED NOT DETECTED Final   Cryptococcus neoformans/gattii NOT DETECTED NOT DETECTED Final   Meth resistant mecA/C and MREJ DETECTED (A) NOT DETECTED Final    Comment: CRITICAL RESULT CALLED TO, READ BACK BY AND VERIFIED WITHNoe Bath AT 0401 10/02/23 JG Performed at [facility], [address], [place], [place] [address]   Urine Culture     Status: Abnormal   Collection Time: 09/30/23  5:57 AM   Specimen: Urine, Suprapubic  Result Value Ref Range Status   Specimen Description   Final    URINE, SUPRAPUBIC Performed at [facility], [address], [place], [place] [address]    Special Requests   Final    NONE Reflexed from [phone] Performed at [facility],  [address] [place], [place] [address]    Culture (A)  Final    >=100,000 COLONIES/mL ESCHERICHIA COLI Confirmed Extended Spectrum Beta-Lactamase Producer (ESBL).  In bloodstream infections from ESBL organisms, carbapenems are preferred over piperacillin/tazobactam. They are shown to have a lower risk of mortality. >=100,000 COLONIES/mL ENTEROCOCCUS FAECALIS    Report Status 10/02/2023 FINAL  Final   Organism ID, Bacteria ESCHERICHIA COLI (A)  Final   Organism ID, Bacteria ENTEROCOCCUS FAECALIS (A)  Final      Susceptibility   Escherichia coli - MIC*    AMPICILLIN  >=32 RESISTANT Resistant     CEFAZOLIN >=64 RESISTANT Resistant     CEFEPIME  8 INTERMEDIATE Intermediate     CEFTRIAXONE  >=64 RESISTANT Resistant     CIPROFLOXACIN  >=4 RESISTANT Resistant     GENTAMICIN <=1 SENSITIVE Sensitive     IMIPENEM 0.5 SENSITIVE Sensitive     NITROFURANTOIN <=16 SENSITIVE Sensitive     TRIMETH /SULFA  >=320 RESISTANT Resistant     AMPICILLIN /SULBACTAM 4 SENSITIVE Sensitive     PIP/TAZO <=4 SENSITIVE Sensitive ug/mL    * >=100,000 COLONIES/mL ESCHERICHIA COLI   Enterococcus faecalis - MIC*    AMPICILLIN  <=2 SENSITIVE Sensitive     NITROFURANTOIN <=16 SENSITIVE Sensitive     VANCOMYCIN  1 SENSITIVE Sensitive     * >=100,000 COLONIES/mL ENTEROCOCCUS FAECALIS  MRSA Next Gen by PCR, Nasal     Status: Abnormal   Collection Time: 10/01/23 10:05 PM   Specimen: Nasal Mucosa; Nasal Swab  Result Value Ref Range Status   MRSA by PCR Next Gen DETECTED (A) NOT DETECTED Final    Comment: RESULT CALLED TO, READ BACK BY AND VERIFIED WITH:  ERICKA KLARAS AT 9562 10/02/23 JG (NOTE) The GeneXpert MRSA Assay (FDA approved for NASAL specimens only), is one component of a comprehensive MRSA colonization surveillance program. It is not intended to diagnose MRSA infection nor to guide or monitor treatment for MRSA infections. Test performance is not FDA approved in patients less than 90 years old. Performed at  [facility], [address] [place], [place] [address]     IMAGING RESULTS:  I have personally reviewed the films ?Rt lung cavitary lesion, also has hyperinflated lungs  Impression/Recommendation ? ?MRSA bacteremia Likely source MRSA cavitary pneumonia MRSA nares positive Pt is on vancomycin   Anemia  Weight loss   H/o Urinary retention has SPC  AKI on CKD  ESBL ecoli and enterococcus in urine culture - pt has been on meropenem . Now that he has lost his picc line no need to continue  Pt wants to go home and does not want any more intervention He has lost peripheral line Will do PO linezolid  and watch closely for any side effects    ________________________________________________ Discussed with patient and requesting provider

## 2023-10-02 NOTE — TOC Progression Note (Signed)
 Transition of Care [facility]) - Progression Note    Patient Details  Name: Zachary [name] MRN: [id number] Date of Birth: 29-Dec-1940  Transition of Care [facility]) CM/SW Contact  Odilia Bennett, LCSW Phone Number: 10/02/2023, 11:35 AM  Clinical Narrative:  CSW met with patient to provide bed offers. Patient stated he wants to go home to see his wife. CSW explained that he is not medically stable for discharge yet and he would have to leave AMA and arrange his own transport. Patient is agreeable to this. CSW called and notified wife. Her brother is coming to the hospital around 1:00. CSW notified patient and he stated he is willing to wait. Patient was active with [facility] for RN and aide prior to admission. CSW asked liaison to see if they can add PT and OT.  Expected Discharge Plan: Skilled Nursing Facility Barriers to Discharge: Continued Medical Work up  Expected Discharge Plan and Services     Post Acute Care Choice: Skilled Nursing Facility Living arrangements for the past 2 months: Single Family Home                                       Social Determinants of Health (SDOH) Interventions SDOH Screenings   Food Insecurity: No Food Insecurity (09/30/2023)  Housing: Low Risk  (09/30/2023)  Transportation Needs: No Transportation Needs (09/30/2023)  Utilities: Not At Risk (09/30/2023)  Alcohol  Screen: Low Risk  (09/25/2021)  Depression (PHQ2-9): Low Risk  (09/26/2023)  Financial Resource Strain: Low Risk  (09/25/2021)  Physical Activity: Insufficiently Active (09/25/2021)  Social Connections: Moderately Isolated (09/30/2023)  Stress: No Stress Concern Present (11/24/2019)  Tobacco Use: Low Risk  (09/30/2023)    Readmission Risk Interventions    10/01/2023    2:47 PM  Readmission Risk Prevention Plan  PCP or Specialist Appt within 3-5 Days Complete  Social Work Consult for Recovery Care Planning/Counseling Complete  Palliative Care Screening Not Applicable

## 2023-10-02 NOTE — IPAL (Signed)
  Interdisciplinary Goals of Care Family Meeting   Date carried out: 10/02/2023  Location of the meeting: Unit  Member's involved: Physician, Family Member or next of kin, and Other: patient,  Durable Power of Insurance risk surveyor: patient has capacity at this time to voice his goals but seems to either 1) lack understanding of how much help he is going to need  or 2) want to just be left alone and not accept care or help from anyone  Discussion: We discussed goals of care for Zachary [name] .  This morning, he clearly states that he wants to leave the hospital, go home to be with his wife.  Received education that in light of significant infectious processes including bacteremia, cavitary pneumonia, catheter associated UTI he will certainly continue to decline and get worse without receiving appropriate antibiotic therapy.  He was set to leave AMA however his family would not take him home and he is not strong enough to walk out/arrange other transportation.  He voices this afternoon that he wants to "be done" he wants to go home and be left alone, he wants to lay in his bed and die, he wants to meet his deceased parents and brother, he states he has had a good life and he does not want to keep coming back to the hospital, he voices that his quality of life at this point is very poor and he does not want to continue trying to treat everything.  His family had some difficulty with this, his brother in law and sister-in-law were present for some of this conversation, I explained to them that her status as power of attorney does not mean that they can unilaterally make decisions for him or forced treatment if he has the capacity to refuse it, and while they may not agree with his decision he is coherent and oriented and understands risk of death with refusing treatment/leaving the hospital.  I discussed with the patient that if this is his wish I would see what I could do to get  hospice services, however family states that he will certainly not be able to have 24/7 help at home, would not want to be in a facility, so options here are limited.  We convinced patient to stay another day and continue antibiotics.  Patient was advised that given his infection, he is likely to make his wife sick and may delay her knee surgery, something that he has really been looking forward to her getting to hopefully help with her pain and her ambulation.  At this point: Continue DO NOT RESUSCITATE CODE STATUS Continue current acute care Plan for possible withdrawal/de-escalation of care Palliative consult Options going forward, possibly hospice  Code status:   Code Status: Limited: Do not attempt resuscitation (DNR) -DNR-LIMITED -Do Not Intubate/DNI    Disposition: Continue current acute care  Time spent for the meeting: 40 min      Melodi Sprung, DO  10/02/2023, 4:05 PM

## 2023-10-02 NOTE — TOC Transition Note (Signed)
 Transition of Care [facility]) - Discharge Note   Patient Details  Name: Zachary [name] MRN: [id number] Date of Birth: [birth year]  Transition of Care [facility]) CM/SW Contact:  Odilia Bennett, LCSW Phone Number: 10/02/2023, 12:09 PM   Clinical Narrative:  Patient is leaving AMA. [facility] liaison is aware. No further concerns. CSW signing off.   Final next level of care: Home w Home Health Services Barriers to Discharge: Patient left Against Medical Advice [facility])   Patient Goals and CMS Choice     Choice offered to / list presented to : Spouse      Discharge Placement                Patient to be transferred to facility by: Brother-in-law Name of family member notified: Morrell Aran Patient and family notified of of transfer: 10/02/23  Discharge Plan and Services Additional resources added to the After Visit Summary for       Post Acute Care Choice: Skilled Nursing Facility                    HH Arranged: RN, PT, OT, Nurse's Aide [facility] Agency: [facility] Date [facility] Agency Contacted: 10/02/23   Representative spoke with at [facility] Agency: Verdis Glade  Social Drivers of Health (SDOH) Interventions SDOH Screenings   Food Insecurity: No Food Insecurity (09/30/2023)  Housing: Low Risk  (09/30/2023)  Transportation Needs: No Transportation Needs (09/30/2023)  Utilities: Not At Risk (09/30/2023)  Alcohol  Screen: Low Risk  (09/25/2021)  Depression (PHQ2-9): Low Risk  (09/26/2023)  Financial Resource Strain: Low Risk  (09/25/2021)  Physical Activity: Insufficiently Active (09/25/2021)  Social Connections: Moderately Isolated (09/30/2023)  Stress: No Stress Concern Present (11/24/2019)  Tobacco Use: Low Risk  (09/30/2023)     Readmission Risk Interventions    10/01/2023    2:47 PM  Readmission Risk Prevention Plan  PCP or Specialist Appt within 3-5 Days Complete  Social Work Consult for Recovery Care Planning/Counseling Complete  Palliative Care Screening Not Applicable

## 2023-10-03 ENCOUNTER — Ambulatory Visit

## 2023-10-03 ENCOUNTER — Telehealth (HOSPITAL_COMMUNITY): Payer: Self-pay | Admitting: Pharmacy Technician

## 2023-10-03 ENCOUNTER — Other Ambulatory Visit (HOSPITAL_COMMUNITY): Payer: Self-pay

## 2023-10-03 DIAGNOSIS — R6520 Severe sepsis without septic shock: Secondary | ICD-10-CM | POA: Diagnosis not present

## 2023-10-03 DIAGNOSIS — N179 Acute kidney failure, unspecified: Secondary | ICD-10-CM | POA: Diagnosis not present

## 2023-10-03 DIAGNOSIS — J189 Pneumonia, unspecified organism: Secondary | ICD-10-CM | POA: Diagnosis not present

## 2023-10-03 DIAGNOSIS — R7881 Bacteremia: Secondary | ICD-10-CM | POA: Diagnosis not present

## 2023-10-03 DIAGNOSIS — Z515 Encounter for palliative care: Secondary | ICD-10-CM

## 2023-10-03 DIAGNOSIS — B9562 Methicillin resistant Staphylococcus aureus infection as the cause of diseases classified elsewhere: Secondary | ICD-10-CM | POA: Diagnosis not present

## 2023-10-03 DIAGNOSIS — N390 Urinary tract infection, site not specified: Secondary | ICD-10-CM | POA: Diagnosis not present

## 2023-10-03 DIAGNOSIS — G9341 Metabolic encephalopathy: Secondary | ICD-10-CM | POA: Diagnosis not present

## 2023-10-03 DIAGNOSIS — A419 Sepsis, unspecified organism: Secondary | ICD-10-CM | POA: Diagnosis not present

## 2023-10-03 DIAGNOSIS — N189 Chronic kidney disease, unspecified: Secondary | ICD-10-CM | POA: Diagnosis not present

## 2023-10-03 DIAGNOSIS — J15212 Pneumonia due to Methicillin resistant Staphylococcus aureus: Secondary | ICD-10-CM | POA: Diagnosis not present

## 2023-10-03 MED ORDER — VANCOMYCIN HCL 500 MG/100ML IV SOLN
500.0000 mg | INTRAVENOUS | Status: DC
  Administered 2023-10-04 – 2023-10-05 (×2): 500 mg via INTRAVENOUS
  Filled 2023-10-03 (×3): qty 100

## 2023-10-03 MED ORDER — CHLORHEXIDINE GLUCONATE CLOTH 2 % EX PADS
6.0000 | MEDICATED_PAD | Freq: Every day | CUTANEOUS | Status: DC
  Administered 2023-10-03 – 2023-10-14 (×11): 6 via TOPICAL

## 2023-10-03 MED ORDER — VANCOMYCIN HCL 500 MG/100ML IV SOLN
500.0000 mg | Freq: Once | INTRAVENOUS | Status: AC
  Administered 2023-10-03: 500 mg via INTRAVENOUS
  Filled 2023-10-03: qty 100

## 2023-10-03 NOTE — Progress Notes (Signed)
 Occupational Therapy Treatment Patient Details Name: Zachary [name] MRN: [id number] DOB: May 01, 1941 Today's Date: 10/03/2023   History of present illness Zachary [name] is an 82yoM who comes to [facility] with acute weakness at home, s/p recent fall, no significant injury sustained. Pt still generally weak from his admission last month. PMH: HLD, HTN, CAD, menieres disease, B12 deficiency, suprapubic catheter.   OT comments  Pt received seated in recliner. Appearing alert; willing to work with OT on walking to the door and washing hands. T/f supervision with extra time to standing at RW. See flowsheet below for further details of session. Left semi-reclined in bed with all needs in reach. OT also assisted pt in navigating use of phone to call wife (needed assist from nursing, as apparently system won't let patients call [phone] numbers, so had to instruct pt on use of operator). Patient will benefit from continued OT while in acute care.       If plan is discharge home, recommend the following:  A little help with walking and/or transfers;A little help with bathing/dressing/bathroom;Assistance with cooking/housework;Assist for transportation;Help with stairs or ramp for entrance   Equipment Recommendations  Other (comment) (defer)    Recommendations for Other Services      Precautions / Restrictions Precautions Precautions: Fall Restrictions Weight Bearing Restrictions Per Provider Order: No       Mobility Bed Mobility Overal bed mobility: Needs Assistance Bed Mobility: Sit to Supine       Sit to supine: Supervision   General bed mobility comments: cues for using bed rails to pull self up.    Transfers Overall transfer level: Needs assistance Equipment used: Rolling walker (2 wheels) Transfers: Sit to/from Stand Sit to Stand: Supervision           General transfer comment: from recliner with verbal cues for hand placement     Balance Overall balance assessment: Needs  assistance Sitting-balance support: Feet supported Sitting balance-Leahy Scale: Good     Standing balance support: Reliant on assistive device for balance, During functional activity, Bilateral upper extremity supported Standing balance-Leahy Scale: Fair Standing balance comment: RW                           ADL either performed or assessed with clinical judgement   ADL Overall ADL's : Needs assistance/impaired                                       General ADL Comments: Pt able to wash hands standing at sink with supervision.    Extremity/Trunk Assessment Upper Extremity Assessment Upper Extremity Assessment: Generalized weakness   Lower Extremity Assessment Lower Extremity Assessment: Generalized weakness        Vision       Perception     Praxis     Communication Communication Communication: No apparent difficulties;Impaired Factors Affecting Communication: Hearing impaired   Cognition Arousal: Alert Behavior During Therapy: WFL for tasks assessed/performed Cognition: No apparent impairments             OT - Cognition Comments: Pt able to discuss his hobbies ([employer] football/basketball) and relate to information OT provided (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 Follows all commands. Very appreciative and motivated.                 Following commands: Intact  Cueing   Cueing Techniques: Verbal cues  Exercises Other Exercises Other Exercises: Able to walk to door and then to sink and back around to other side of the bed, CGA/SBA with RW    Shoulder Instructions       General Comments NT helped at beginning of session with emptying catheter of urine. Pt was up in the chair and stating he had been up for approx 45 minutes.    Pertinent Vitals/ Pain       Pain Assessment Pain Assessment: No/denies pain  Home Living                                           Prior Functioning/Environment              Frequency  Min 2X/week        Progress Toward Goals  OT Goals(current goals can now be found in the care plan section)  Progress towards OT goals: Progressing toward goals  Acute Rehab OT Goals Patient Stated Goal: Get better OT Goal Formulation: With patient Time For Goal Achievement: 10/15/23 Potential to Achieve Goals: Fair ADL Goals Pt Will Perform Grooming: with modified independence;standing Pt Will Perform Lower Body Dressing: with modified independence;sit to/from stand Pt Will Transfer to Toilet: with modified independence;ambulating Pt Will Perform Toileting - Clothing Manipulation and hygiene: with modified independence;sit to/from stand  Plan      Co-evaluation                 AM-PAC OT "6 Clicks" Daily Activity     Outcome Measure   Help from another person eating meals?: None Help from another person taking care of personal grooming?: None Help from another person toileting, which includes using toliet, bedpan, or urinal?: A Little Help from another person bathing (including washing, rinsing, drying)?: A Little Help from another person to put on and taking off regular upper body clothing?: None Help from another person to put on and taking off regular lower body clothing?: A Little 6 Click Score: 21    End of Session Equipment Utilized During Treatment: Rolling walker (2 wheels)  OT Visit Diagnosis: Unsteadiness on feet (R26.81);Muscle weakness (generalized) (M62.81)   Activity Tolerance Patient tolerated treatment well   Patient Left in bed;with call bell/phone within reach;with bed alarm set   Nurse Communication Mobility status        Time: 1610-9604 OT Time Calculation (min): 28 min  Charges: OT General Charges $OT Visit: 1 Visit OT Treatments $Self Care/Home Management : 8-22 mins $Therapeutic Activity: 8-22 mins  Zachary Havens, MS, OTR/L   Zachary [name] 10/03/2023, 12:09  PM

## 2023-10-03 NOTE — Progress Notes (Signed)
 PROGRESS NOTE    Zachary [name]   ZOX:[id number] DOB: [birth year]  DOA: 09/30/2023 Date of Service: 10/03/23 which is hospital day 3  PCP: [name], MD    Hospital course / significant events:   HPI: Zachary [name] is a 83 y.o. male with medical history significant of HTN, urinary retention s/p suprapubic catheter placement,  HLD. Presents to ED from home via EMS w/ weakness.   Of note, hospitalized last month 03/04-03/07/25 w/ substantial urinary retention and AKI, Foley placed, f/u urology and suprapubic cath was placed 08/26/23. Also notable, has been following w/ PCP last visit 09/24/23 concern for unintentional weight loss, SPEP --> pending. Has been following w/ hematology last visit 09/26/23 for anemia and lung nodule, flow cytometry --> neg, CT chest --> pending   04/29: admitted to hospitalist service w/ sepsis, pna, uti, encephalopathy. ICU consult but he did not need pressors 04/30: await cultures, CT chest to eval pna/other RML. Alert today. 05/01: CT demonstrating cavitary pneumonia.  1 blood culture positive, concern for MRSA.  Patient requesting to leave AMA, see progress note and IPAL note.  Will stay at least through tonight.  ID has seen patient, considering for p.o. antibiotics in case patient is wanting to go home tomorrow 05/02: pt has reconsidered for treatment, he states he "made a mistake" yesterday and is now open to continuing IV antibiotics and going ot rehab hopefully same facility as his wife will be going to post-op. Palliative to follow either way.     Consultants:  PCCM Infectious disease Palliative care   Procedures/Surgeries: none      ASSESSMENT & PLAN:   Sepsis POA ESBL UTI, + cavitary pneumonia RML MRSA bacteremia Noted concern for multilobar pneumonia on CXR UA also indicative of infection on urine culture in setting of chronic indwelling foley  Prior history of ESBL E coli is a confounding issue  Lactate 1.4   Infectious Disease following  Abx per ID, linezolid  possible if not going to continue w/ IV abx  Monitor    Goals of care discussion Palliative care consult to facilitate GOC discussion and plan In my judgment, patient has capacity to voice his goals for quality of life Has changed his mind from yesterday, I do not think this indicates diminished capacity, he is within his rights to change plans/goals    Elevated troponin likely demand ischemis in setting of sepsis, less likely NSTEMI (non-ST elevated myocardial infarction)  Troponin in the 200s on presentation in setting of sepsis associated with multifocal pneumonia and UTI No active chest pain Suspect secondary demand ischemia Stage IV CKD and poor renal clearance also confounding issues Defer ASA in setting of hematuria  Monitor   CKD (chronic kidney disease) stage 4, GFR 15-29 ml/min  Creatinine 2.4 on presentation with GFR in the 20s Appears to be near baseline Monitor   Acute encephalopathy POA - resolved  Positive generalized lethargy in setting of active sepsis with recurrent multifocal pneumonia and UTI CT head within normal  Monitor   Urinary retention s/p SP cath  History of ESBL E coli infection based on culture 03/12 UA indicative of infection POA 08/2023 Urine culture showing ESBL E coli  Abx as above per ID Suprapubic catheter exchanged in the ER  Follow cultures Keep cath site clean/dry    Cavitary Pneumonia RML confirmed on CT chest  Multilobar pneumonia on presentation on CXR in the setting of sepsis  No hypoxia at present  Abx as above per ID  Follow cultures Incentive spirometry    Essential hypertension Hold BP regimen in setting hypotension assd with sepsis  Monitor     Mixed hyperlipidemia hold lipitor          underweight based on BMI: Body mass index is 16.05 kg/m.[name] Significantly low or high BMI is associated with higher medical risk.  Underweight - under 18  overweight - 25 to  29 obese - 30 or more Class 1 obesity: BMI of 30.0 to 34 Class 2 obesity: BMI of 35.0 to 39 Class 3 obesity: BMI of 40.0 to 49 Super Morbid Obesity: BMI 50-59 Super-super Morbid Obesity: BMI 60+ Healthy nutrition and physical activity advised as adjunct to other disease management and risk reduction treatments    DVT prophylaxis: lovenox   IV fluids: none at this time  Nutrition: per dietician Central lines / other devices: suprapubic cath  Code Status: DNR ACP documentation reviewed:  none on file in [facility]  [facility] needs: SNF rehab Medical barriers to dispo: sepsis/infection awiting cultures and clinical improvement. Expected medical readiness for discharge several more days.              Subjective / Brief ROS:  Patient is apologetic today - states "I made a mistake" referring to "giving up" yesterday. He voices goal to get stronger so he can see his wife See above under goals of care discussion  Denies CP/SOB.  Pain controlled.  Denies new weakness.  Tolerating diet.   Family Communication: will discuss with palliative care and family later today      Objective Findings:  Vitals:   10/02/23 2353 10/03/23 0322 10/03/23 0850 10/03/23 1210  BP: 100/63 103/73 114/68 94/69  Pulse: 68 65 86 (!) 59  Resp: 16 16    Temp: 98 F (36.7 C) 97.7 F (36.5 C) 98.1 F (36.7 C)   TempSrc: Oral Oral Oral   SpO2: 95% 95% 96% 99%  Weight:      Height:        Intake/Output Summary (Last 24 hours) at 10/03/2023 1434 Last data filed at 10/03/2023 0436 Gross per 24 hour  Intake 741.42 ml  Output 1300 ml  Net -558.58 ml   Filed Weights   09/30/23 0457  Weight: 56.7 kg    Examination:  Physical Exam Constitutional:      General: He is not in acute distress.    Appearance: He is not ill-appearing (frail, thin) or toxic-appearing.  Cardiovascular:     Rate and Rhythm: Normal rate and regular rhythm.  Pulmonary:     Effort: Pulmonary effort is normal.     Breath  sounds: Normal breath sounds.  Abdominal:     General: Bowel sounds are normal. There is no distension.  Musculoskeletal:     Right lower leg: No edema.     Left lower leg: No edema.  Neurological:     General: No focal deficit present.     Mental Status: He is alert and oriented to person, place, and time. Mental status is at baseline.  Psychiatric:        Mood and Affect: Mood normal.        Behavior: Behavior normal.          Scheduled Medications:   enoxaparin  (LOVENOX ) injection  30 mg Subcutaneous Q24H   feeding supplement  237 mL Oral TID BM   linezolid   600 mg Oral Q12H   multivitamin with minerals  1 tablet Oral Daily    Continuous Infusions:  PRN Medications:  acetaminophen  **OR** acetaminophen , ondansetron  **OR** ondansetron  (ZOFRAN ) IV  Antimicrobials from admission:  Anti-infectives (From admission, onward)    Start     Dose/Rate Route Frequency Ordered Stop   10/02/23 2200  linezolid  (ZYVOX ) tablet 600 mg        600 mg Oral Every 12 hours 10/02/23 1550     10/01/23 0900  vancomycin  (VANCOCIN ) IVPB 1000 mg/200 mL premix  Status:  Discontinued        1,000 mg 200 mL/hr over 60 Minutes Intravenous Every 48 hours 10/01/23 0736 10/02/23 1550   10/01/23 0800  vancomycin  variable dose per unstable renal function (pharmacist dosing)  Status:  Discontinued         Does not apply See admin instructions 09/30/23 0747 10/01/23 0738   09/30/23 1000  meropenem  (MERREM ) 500 mg in sodium chloride  0.9 % 100 mL IVPB  Status:  Discontinued        500 mg 200 mL/hr over 30 Minutes Intravenous Every 12 hours 09/30/23 0728 10/02/23 1550   09/30/23 0500  ceFEPIme  (MAXIPIME ) 2 g in sodium chloride  0.9 % 100 mL IVPB        2 g 200 mL/hr over 30 Minutes Intravenous  Once 09/30/23 0458 09/30/23 0608   09/30/23 0500  metroNIDAZOLE  (FLAGYL ) IVPB 500 mg        500 mg 100 mL/hr over 60 Minutes Intravenous  Once 09/30/23 0458 09/30/23 0826   09/30/23 0500  vancomycin  (VANCOCIN )  IVPB 1000 mg/200 mL premix        1,000 mg 200 mL/hr over 60 Minutes Intravenous  Once 09/30/23 0458 09/30/23 1610           Data Reviewed:  I have personally reviewed the following...  CBC: Recent Labs  Lab 09/30/23 0506 10/01/23 0429  WBC 14.8* 8.1  NEUTROABS 12.8*  --   HGB 9.2* 8.5*  HCT 27.4* 25.2*  MCV 99.6 98.1  PLT 215 184   Basic Metabolic Panel: Recent Labs  Lab 09/30/23 0506 10/01/23 0429  NA 129* 132*  K 4.2 3.9  CL 98 104  CO2 20* 18*  GLUCOSE 135* 95  BUN 67* 59*  CREATININE 2.41* 2.05*  CALCIUM  8.7* 8.2*  MG 2.1  --    GFR: Estimated Creatinine Clearance: 23 mL/min (A) (by C-G formula based on SCr of 2.05 mg/dL (H)). Liver Function Tests: Recent Labs  Lab 09/30/23 0506 10/01/23 0429  AST 21 26  ALT 20 22  ALKPHOS 76 80  BILITOT 1.1 0.7  PROT 5.8* 4.6*  ALBUMIN 2.4* 1.7*   No results for input(s): "LIPASE", "AMYLASE" in the last 168 hours. No results for input(s): "AMMONIA" in the last 168 hours. Coagulation Profile: Recent Labs  Lab 09/30/23 0506 09/30/23 0909  INR 1.2 2.3*   Cardiac Enzymes: No results for input(s): "CKTOTAL", "CKMB", "CKMBINDEX", "TROPONINI" in the last 168 hours. BNP (last 3 results) No results for input(s): "PROBNP" in the last 8760 hours. HbA1C: No results for input(s): "HGBA1C" in the last 72 hours. CBG: No results for input(s): "GLUCAP" in the last 168 hours. Lipid Profile: No results for input(s): "CHOL", "HDL", "LDLCALC", "TRIG", "CHOLHDL", "LDLDIRECT" in the last 72 hours. Thyroid  Function Tests: No results for input(s): "TSH", "T4TOTAL", "FREET4", "T3FREE", "THYROIDAB" in the last 72 hours. Anemia Panel: No results for input(s): "VITAMINB12", "FOLATE", "FERRITIN", "TIBC", "IRON", "RETICCTPCT" in the last 72 hours. Most Recent Urinalysis On File:     Component Value Date/Time   COLORURINE YELLOW (A) 09/30/2023 9604  APPEARANCEUR CLOUDY (A) 09/30/2023 0557   APPEARANCEUR [place] (A) 08/12/2023  1141   LABSPEC 1.015 09/30/2023 0557   PHURINE 5.0 09/30/2023 0557   GLUCOSEU NEGATIVE 09/30/2023 0557   HGBUR SMALL (A) 09/30/2023 0557   BILIRUBINUR NEGATIVE 09/30/2023 0557   BILIRUBINUR Negative 08/12/2023 1141   KETONESUR NEGATIVE 09/30/2023 0557   PROTEINUR 30 (A) 09/30/2023 0557   UROBILINOGEN 1.0 06/30/2019 0823   NITRITE NEGATIVE 09/30/2023 0557   LEUKOCYTESUR LARGE (A) 09/30/2023 0557   Sepsis Labs: @LABRCNTIP (procalcitonin:4,lacticidven:4) Microbiology: Recent Results (from the past 240 hours)  Resp panel by RT-PCR (RSV, Flu A&B, Covid) Anterior Nasal Swab     Status: None   Collection Time: 09/30/23  5:06 AM   Specimen: Anterior Nasal Swab  Result Value Ref Range Status   SARS Coronavirus 2 by RT PCR NEGATIVE NEGATIVE Final    Comment: (NOTE) SARS-CoV-2 target nucleic acids are NOT DETECTED.  The SARS-CoV-2 RNA is generally detectable in upper respiratory specimens during the acute phase of infection. The lowest concentration of SARS-CoV-2 viral copies this assay can detect is 138 copies/mL. A negative result does not preclude SARS-Cov-2 infection and should not be used as the sole basis for treatment or other patient management decisions. A negative result may occur with  improper specimen collection/handling, submission of specimen other than nasopharyngeal swab, presence of viral mutation(s) within the areas targeted by this assay, and inadequate number of viral copies(<138 copies/mL). A negative result must be combined with clinical observations, patient history, and epidemiological information. The expected result is Negative.  Fact Sheet for Patients:  BloggerCourse.com  Fact Sheet for Healthcare Providers:  SeriousBroker.it  This test is no t yet approved or cleared by the [place]  FDA and  has been authorized for detection and/or diagnosis of SARS-CoV-2 by FDA under an Emergency Use Authorization  (EUA). This EUA will remain  in effect (meaning this test can be used) for the duration of the COVID-19 declaration under Section 564(b)(1) of the Act, 21 U.S.C.section 360bbb-3(b)(1), unless the authorization is terminated  or revoked sooner.       Influenza A by PCR NEGATIVE NEGATIVE Final   Influenza B by PCR NEGATIVE NEGATIVE Final    Comment: (NOTE) The Xpert Xpress SARS-CoV-2/FLU/RSV plus assay is intended as an aid in the diagnosis of influenza from Nasopharyngeal swab specimens and should not be used as a sole basis for treatment. Nasal washings and aspirates are unacceptable for Xpert Xpress SARS-CoV-2/FLU/RSV testing.  Fact Sheet for Patients: BloggerCourse.com  Fact Sheet for Healthcare Providers: SeriousBroker.it  This test is not yet approved or cleared by the [place]  FDA and has been authorized for detection and/or diagnosis of SARS-CoV-2 by FDA under an Emergency Use Authorization (EUA). This EUA will remain in effect (meaning this test can be used) for the duration of the COVID-19 declaration under Section 564(b)(1) of the Act, 21 U.S.C. section 360bbb-3(b)(1), unless the authorization is terminated or revoked.     Resp Syncytial Virus by PCR NEGATIVE NEGATIVE Final    Comment: (NOTE) Fact Sheet for Patients: BloggerCourse.com  Fact Sheet for Healthcare Providers: SeriousBroker.it  This test is not yet approved or cleared by the [place]  FDA and has been authorized for detection and/or diagnosis of SARS-CoV-2 by FDA under an Emergency Use Authorization (EUA). This EUA will remain in effect (meaning this test can be used) for the duration of the COVID-19 declaration under Section 564(b)(1) of the Act, 21 U.S.C. section 360bbb-3(b)(1), unless the authorization is terminated or revoked.  Performed at [facility], [address]  [place], [place] [address]   Blood Culture (routine x 2)     Status: Abnormal (Preliminary result)   Collection Time: 09/30/23  5:06 AM   Specimen: Left Antecubital; Blood  Result Value Ref Range Status   Specimen Description   Final    LEFT ANTECUBITAL Performed at [facility], [address], [place], [place] [address]    Special Requests   Final    BOTTLES DRAWN AEROBIC AND ANAEROBIC Blood Culture results may not be optimal due to an inadequate volume of blood received in culture bottles Performed at [facility], [address] [place], [place] [address]    Culture  Setup Time   Final    GRAM POSITIVE COCCI AEROBIC BOTTLE ONLY CRITICAL RESULT CALLED TO, READ BACK BY AND VERIFIED WITH:  NATHAN BELUE AT 0401 10/02/23 JG    Culture (A)  Final    STAPHYLOCOCCUS AUREUS SUSCEPTIBILITIES TO FOLLOW Performed at [facility], [address], [place], [place] [address]    Report Status PENDING  Incomplete  Blood Culture (routine x 2)     Status: None (Preliminary result)   Collection Time: 09/30/23  5:06 AM   Specimen: BLOOD  Result Value Ref Range Status   Specimen Description BLOOD RIGTH [facility]  Final   Special Requests   Final    BOTTLES DRAWN AEROBIC AND ANAEROBIC Blood Culture adequate volume   Culture   Final    NO GROWTH 3 DAYS Performed at [facility], [address] [place], [place] [address]    Report Status PENDING  Incomplete  Blood Culture ID Panel (Reflexed)     Status: Abnormal   Collection Time: 09/30/23  5:06 AM  Result Value Ref Range Status   Enterococcus faecalis NOT DETECTED NOT DETECTED Final   Enterococcus Faecium NOT DETECTED NOT DETECTED Final   Listeria monocytogenes NOT DETECTED NOT DETECTED Final   Staphylococcus species DETECTED (A) NOT DETECTED Final    Comment: CRITICAL RESULT CALLED TO, READ BACK BY AND VERIFIED WITH:  NATHAN BELUE AT 0401 10/02/23 JG    Staphylococcus aureus (BCID) DETECTED (A) NOT DETECTED Final     Comment: Methicillin (oxacillin)-resistant Staphylococcus aureus (MRSA). MRSA is predictably resistant to beta-lactam antibiotics (except ceftaroline). Preferred therapy is vancomycin  unless clinically contraindicated. Patient requires contact precautions if  hospitalized. CRITICAL RESULT CALLED TO, READ BACK BY AND VERIFIED WITH:  NATHAN BELUE AT 0401 10/02/23 JG    Staphylococcus epidermidis NOT DETECTED NOT DETECTED Final   Staphylococcus lugdunensis NOT DETECTED NOT DETECTED Final   Streptococcus species NOT DETECTED NOT DETECTED Final   Streptococcus agalactiae NOT DETECTED NOT DETECTED Final   Streptococcus pneumoniae NOT DETECTED NOT DETECTED Final   Streptococcus pyogenes NOT DETECTED NOT DETECTED Final   A.calcoaceticus-baumannii NOT DETECTED NOT DETECTED Final   Bacteroides fragilis NOT DETECTED NOT DETECTED Final   Enterobacterales NOT DETECTED NOT DETECTED Final   Enterobacter cloacae complex NOT DETECTED NOT DETECTED Final   Escherichia coli NOT DETECTED NOT DETECTED Final   Klebsiella aerogenes NOT DETECTED NOT DETECTED Final   Klebsiella oxytoca NOT DETECTED NOT DETECTED Final   Klebsiella pneumoniae NOT DETECTED NOT DETECTED Final   Proteus species NOT DETECTED NOT DETECTED Final   Salmonella species NOT DETECTED NOT DETECTED Final   Serratia marcescens NOT DETECTED NOT DETECTED Final   Haemophilus influenzae NOT DETECTED NOT DETECTED Final   Neisseria meningitidis NOT DETECTED NOT DETECTED Final  Pseudomonas aeruginosa NOT DETECTED NOT DETECTED Final   Stenotrophomonas maltophilia NOT DETECTED NOT DETECTED Final   Candida albicans NOT DETECTED NOT DETECTED Final   Candida auris NOT DETECTED NOT DETECTED Final   Candida glabrata NOT DETECTED NOT DETECTED Final   Candida krusei NOT DETECTED NOT DETECTED Final   Candida parapsilosis NOT DETECTED NOT DETECTED Final   Candida tropicalis NOT DETECTED NOT DETECTED Final   Cryptococcus neoformans/gattii NOT DETECTED NOT  DETECTED Final   Meth resistant mecA/C and MREJ DETECTED (A) NOT DETECTED Final    Comment: CRITICAL RESULT CALLED TO, READ BACK BY AND VERIFIED WITHNoe Bath AT 0401 10/02/23 JG Performed at [facility], [address], [place], [place] [address]   Urine Culture     Status: Abnormal   Collection Time: 09/30/23  5:57 AM   Specimen: Urine, Suprapubic  Result Value Ref Range Status   Specimen Description   Final    URINE, SUPRAPUBIC Performed at [facility], [address] [place], [place] [address]    Special Requests   Final    NONE Reflexed from [id number] Performed at [facility], [address] [place], [place] [address]    Culture (A)  Final    >=100,000 COLONIES/mL ESCHERICHIA COLI Confirmed Extended Spectrum Beta-Lactamase Producer (ESBL).  In bloodstream infections from ESBL organisms, carbapenems are preferred over piperacillin/tazobactam. They are shown to have a lower risk of mortality. >=100,000 COLONIES/mL ENTEROCOCCUS FAECALIS    Report Status 10/02/2023 FINAL  Final   Organism ID, Bacteria ESCHERICHIA COLI (A)  Final   Organism ID, Bacteria ENTEROCOCCUS FAECALIS (A)  Final      Susceptibility   Escherichia coli - MIC*    AMPICILLIN  >=32 RESISTANT Resistant     CEFAZOLIN >=64 RESISTANT Resistant     CEFEPIME  8 INTERMEDIATE Intermediate     CEFTRIAXONE  >=64 RESISTANT Resistant     CIPROFLOXACIN  >=4 RESISTANT Resistant     GENTAMICIN <=1 SENSITIVE Sensitive     IMIPENEM 0.5 SENSITIVE Sensitive     NITROFURANTOIN <=16 SENSITIVE Sensitive     TRIMETH /SULFA  >=320 RESISTANT Resistant     AMPICILLIN /SULBACTAM 4 SENSITIVE Sensitive     PIP/TAZO <=4 SENSITIVE Sensitive ug/mL    * >=100,000 COLONIES/mL ESCHERICHIA COLI   Enterococcus faecalis - MIC*    AMPICILLIN  <=2 SENSITIVE Sensitive     NITROFURANTOIN <=16 SENSITIVE Sensitive     VANCOMYCIN  1 SENSITIVE Sensitive     * >=100,000 COLONIES/mL ENTEROCOCCUS FAECALIS  MRSA Next Gen by PCR,  Nasal     Status: Abnormal   Collection Time: 10/01/23 10:05 PM   Specimen: Nasal Mucosa; Nasal Swab  Result Value Ref Range Status   MRSA by PCR Next Gen DETECTED (A) NOT DETECTED Final    Comment: RESULT CALLED TO, READ BACK BY AND VERIFIED WITH:  ERICKA KLARAS AT 9562 10/02/23 JG (NOTE) The GeneXpert MRSA Assay (FDA approved for NASAL specimens only), is one component of a comprehensive MRSA colonization surveillance program. It is not intended to diagnose MRSA infection nor to guide or monitor treatment for MRSA infections. Test performance is not FDA approved in patients less than 2 years old. Performed at [facility], [address] [place], [place] [address]       Radiology Studies last 3 days: CT CHEST WO CONTRAST Result Date: 10/01/2023 CLINICAL DATA:  Weakness, fever, abnormal x-ray EXAM: CT CHEST WITHOUT CONTRAST TECHNIQUE: Multidetector CT imaging of the chest was performed following the standard protocol without  IV contrast. RADIATION DOSE REDUCTION: This exam was performed according to the departmental dose-optimization program which includes automated exposure control, adjustment of the mA and/or kV according to patient size and/or use of iterative reconstruction technique. COMPARISON:  03/04/2022, 08/08/2023, 09/30/2023 FINDINGS: Cardiovascular: Unenhanced imaging of the heart is unremarkable, without significant pericardial effusion. 4.5 cm ascending thoracic aortic aneurysm. Atherosclerosis of the aorta and coronary vasculature. Assessment of the vascular lumen cannot be performed without intravenous contrast. Mediastinum/Nodes: No enlarged mediastinal or axillary lymph nodes. Thyroid  gland, trachea, and esophagus demonstrate no significant findings. Moderate hiatal hernia. Lungs/Pleura: There is a thick walled right upper lobe cavity measuring 9.2 x 7.2 x 7.5 cm, with gas fluid level. There is surrounding dense airspace disease within the right upper lobe as well as  within the right middle lobe. Findings are most consistent with cavitary pneumonia, new since the 08/08/2023 CT. There are small free-flowing bilateral pleural effusions, right greater than left. Dependent consolidation within the lower lobes most consistent with atelectasis. No pneumothorax. Central airways are patent. Upper Abdomen: Upper abdominal ascites. Musculoskeletal: No acute or destructive bony abnormalities. Reconstructed images demonstrate no additional findings. IMPRESSION: 1. Large thick walled right upper lobe cavity with gas fluid level, with dense airspace disease involving the right upper and right middle lobes, most consistent with cavitating pneumonia. This has developed since the abdominal CT 08/08/2023. 2. Small free-flowing bilateral pleural effusions. 3. Moderate hiatal hernia. 4. 4.5 cm ascending thoracic aortic aneurysm. Ascending thoracic aortic aneurysm. Recommend semi-annual imaging followup by CTA or MRA and referral to cardiothoracic surgery if not already obtained. This recommendation follows 2010 ACCF/AHA/AATS/ACR/ASA/SCA/SCAI/SIR/STS/SVM Guidelines for the Diagnosis and Management of Patients With Thoracic Aortic Disease. Circulation. 2010; 121: V784-O962. Aortic aneurysm NOS (ICD10-I71.9) 5.  Aortic Atherosclerosis (ICD10-I70.0). 6. Upper abdominal ascites. Electronically Signed   By: Bobbye Burrow M.D.   On: 10/01/2023 20:12   DG Chest Port 1 View Result Date: 09/30/2023 CLINICAL DATA:  83 year old male with possible sepsis. EXAM: PORTABLE CHEST 1 VIEW COMPARISON:  Chest CT 03/04/2022 and earlier. FINDINGS: Portable AP supine views at 0600 hours. Chronic retrocardiac moderate to large gastric hiatal hernia. Stable cardiac size and mediastinal contours. Chronic pulmonary hyperinflation. Patchy and indistinct new multifocal right lung opacity in both the upper and lower lung. No superimposed pneumothorax, pulmonary edema, pleural effusion. No definite left lung involvement. No  acute osseous abnormality identified. Nondilated gas-filled bowel in the upper abdomen. IMPRESSION: 1. Chronic pulmonary hyperinflation with indistinct new multifocal right lung opacity suspicious for developing multilobar bronchopneumonia/pneumonia in this setting. No pleural effusion. 2. Chronic moderate to large hiatal hernia. Electronically Signed   By: Marlise Simpers M.D.   On: 09/30/2023 06:25   CT Cervical Spine Wo Contrast Result Date: 09/30/2023 CLINICAL DATA:  83 year old male status post fall in garage ester day. Generalized weakness, decreased p.o. Hematuria. Shortness of breath. EXAM: CT CERVICAL SPINE WITHOUT CONTRAST TECHNIQUE: Multidetector CT imaging of the cervical spine was performed without intravenous contrast. Multiplanar CT image reconstructions were also generated. RADIATION DOSE REDUCTION: This exam was performed according to the departmental dose-optimization program which includes automated exposure control, adjustment of the mA and/or kV according to patient size and/or use of iterative reconstruction technique. COMPARISON:  Head CT today. FINDINGS: Alignment: Maintained cervical lordosis. Cervicothoracic junction alignment is within normal limits. Bilateral posterior element alignment is within normal limits. Skull base and vertebrae: Bone mineralization is within normal limits for age. Visualized skull base is intact. No atlanto-occipital dissociation. C1 and C2 appear intact and  aligned. No acute osseous abnormality identified. Soft tissues and spinal canal: No prevertebral fluid or swelling. No visible canal hematoma. Bulky calcified carotid atherosclerosis in the neck, severe on the right (series 3, image 58). Disc levels: Generally mild for age cervical spine degeneration and capacious CT appearance of the cervical spinal canal at most levels. Upper chest: Visible upper thoracic levels appear intact. Clear lung apices with questionable emphysema. Trace intravenous gas at the thoracic  inlet, likely IV access related. IMPRESSION: 1. No acute traumatic injury identified in the cervical spine. Mild for age cervical spine degeneration. 2. Calcified carotid atherosclerosis in the neck, Severe at the Right ICA. Electronically Signed   By: Marlise Simpers M.D.   On: 09/30/2023 05:57   CT HEAD WO CONTRAST ( ) Result Date: 09/30/2023 CLINICAL DATA:  83 year old male status post fall in garage ester day. Generalized weakness, decreased p.o. Hematuria. Shortness of breath. EXAM: CT HEAD WITHOUT CONTRAST TECHNIQUE: Contiguous axial images were obtained from the base of the skull through the vertex without intravenous contrast. RADIATION DOSE REDUCTION: This exam was performed according to the departmental dose-optimization program which includes automated exposure control, adjustment of the mA and/or kV according to patient size and/or use of iterative reconstruction technique. COMPARISON:  Brain MRI 04/12/2020.  Head CT 01/20/2017. FINDINGS: Brain: Cerebral volume stable and within normal limits for age. Choroid plexus cysts, normal variant. No midline shift, ventriculomegaly, mass effect, evidence of mass lesion, intracranial hemorrhage or evidence of cortically based acute infarction. Gray-white differentiation stable and within normal limits for age. Vascular: Advanced calcified atherosclerosis at the skull base. No suspicious intracranial vascular hyperdensity. Skull: Stable, intact. Sinuses/Orbits: Visualized paranasal sinuses and mastoids are clear. Other: No acute orbit or scalp soft tissue injury identified. Stable postoperative changes to the globes. IMPRESSION: 1. No acute intracranial abnormality or acute traumatic injury identified. 2. Stable and negative for age noncontrast CT appearance of the brain. Electronically Signed   By: Marlise Simpers M.D.   On: 09/30/2023 05:55       Time spent: 50 min     Melodi Sprung, DO [facility] 10/03/2023, 2:34 PM    Dictation software may have  been used to generate the above note. Typos may occur and escape review in typed/dictated notes. Please contact Dr Authur Leghorn directly for clarity if needed.  Staff may message me via secure chat in Epic  but this may not receive an immediate response,  please page me for urgent matters!  If 7PM-7AM, please contact night coverage [email or web address]

## 2023-10-03 NOTE — Consult Note (Signed)
 Pharmacy Antibiotic Note  ESAU WILKIN is a 83 y.o. male admitted on 09/30/2023 with MRSA bacteremia.  Patient has history of ESBL UTI from 08/2023. On 5/1 patient stated he wished to leave and peripheral IV was removed.  He was then talked into staying the night 5/1 and his antibiotic was change to oral linezolid .  On 5/2 patient was agreeable to placing IV and eventually PICC for long-term antibiotics to treat MRSA bacteremia.  CT chest with cavitary pneumonia. Pharmacy has been  consulted for vancomycin  dosing.   Today, 10/03/2023 Day # 4 antibiotics  Renal: SCr 2.05 with PMH of CKD WBC WNL Afebrile 4/29 blood cx: 1/4 GPC, MRSA per BCID 4/29 urine cx: ESBL E coli and E faecalis (amp susc) 5/2 repeat blood cx: pending Previously on vancomycin  1gm IV q48h - last dose given 4/30 at 0949   Plan: Anticipate vancomycin  levels detectable based on previous dosing regimen and dose due today.  Will resume vancomycin  at 500mg  IV q24h AUC predicted 520 (goall 400-600) Used SCr of 2.05 and total BW (as weighs < IBW).  Cmin = 16.3 Monitor renal function closely Check peak and trough with 5/4 dose Follow repeat blood cultures ID following  Height: 6\' 2"  (188 cm) Weight: 56.7 kg (125 lb) IBW/kg (Calculated) : 82.2  Temp (24hrs), Avg:98 F (36.7 C), Min:97.7 F (36.5 C), Max:98.5 F (36.9 C)  Recent Labs  Lab 09/30/23 0506 10/01/23 0429  WBC 14.8* 8.1  CREATININE 2.41* 2.05*  LATICACIDVEN 1.4  --   VANCORANDOM  --  9    Estimated Creatinine Clearance: 23 mL/min (A) (by C-G formula based on SCr of 2.05 mg/dL (H)).    No Known Allergies  Antimicrobials this admission: Linezolid  5/1 >> 5/2 vancomycin  4/29 >> 4/30, 5/2 >> Meropenem  4/29 >> 5/1 Cefepime  x 1 4/29 Flagyl  x 1 4/29   Thank you for allowing pharmacy to be a part of this patient's care.  Kalum Minner, PharmD, BCPS, BCIDP Work Cell: [phone] 10/03/2023 4:29 PM

## 2023-10-03 NOTE — Plan of Care (Signed)

## 2023-10-03 NOTE — Care Management Important Message (Signed)
 Important Message  Patient Details  Name: Zachary [name] MRN: [id number] Date of Birth: 31-May-1941   Important Message Given:  Yes - Medicare IM     Anise Kerns 10/03/2023, 12:10 PM

## 2023-10-03 NOTE — Telephone Encounter (Signed)
 Patient Product/process development scientist completed.    The patient is insured through [employer]. Patient has Medicare and is not eligible for a copay card, but may be able to apply for patient assistance or Medicare RX Payment Plan (Patient Must reach out to their plan, if eligible for payment plan), if available.    Ran test claim for linezolid  600 mg and the current 28 day co-pay is $136.16 due to a $250.00 deductible.   This test claim was processed through [facility]- copay amounts may vary at other pharmacies due to pharmacy/plan contracts, or as the patient moves through the different stages of their insurance plan.     Morgan Arab, CPHT Pharmacy Technician III Certified Patient Advocate [facility] Patient Advocate Team Direct Number: [phone]  Fax: [phone]

## 2023-10-03 NOTE — Progress Notes (Signed)
 Physical Therapy Treatment Patient Details Name: Zachary [name] MRN: [id number] DOB: 06/18/1940 Today's Date: 10/03/2023   History of Present Illness Zachary [name] is an 82yoM who comes to [facility] with acute weakness at home, s/p recent fall, no significant injury sustained. Pt still generally weak from his admission last month. PMH: HLD, HTN, CAD, menieres disease, B12 deficiency, suprapubic catheter.    PT Comments  Pt in bed on entry, continues to feel weak, but no pain. Pt reports no mobility OOB yesterday. Pt requires minA to get to EOB, heavy min to light modA to come to standing, does better from surface elevation. Pt is able to stand for self dental care at sink with close supervision, but does fatigues and requires a sit break prior to AMB over to recliner. Session ended early to allow for labs to be drawn. WIll continue to follow.     If plan is discharge home, recommend the following: A little help with walking and/or transfers;Assistance with cooking/housework;Assist for transportation;Help with stairs or ramp for entrance   Can travel by private vehicle     No  Equipment Recommendations  None recommended by PT    Recommendations for Other Services       Precautions / Restrictions Precautions Precautions: Fall Restrictions Weight Bearing Restrictions Per Provider Order: No     Mobility  Bed Mobility Overal bed mobility: Needs Assistance Bed Mobility: Supine to Sit     Supine to sit: Min assist          Transfers Overall transfer level: Needs assistance Equipment used: Rolling walker (2 wheels) Transfers: Sit to/from Stand Sit to Stand: Min assist, Mod assist                Ambulation/Gait   Gait Distance (Feet): 15 Feet Assistive device: Rolling walker (2 wheels) Gait Pattern/deviations: Step-through pattern       General Gait Details: going to recliner so Lab can service this patient.   Stairs             Wheelchair Mobility      Tilt Bed    Modified Rankin (Stroke Patients Only)       Balance                                            Communication    Cognition                                        Cueing    Exercises Other Exercises Other Exercises: standing at sink, brushing teeth, then sits for arest break thereafter    General Comments        Pertinent Vitals/Pain Pain Assessment Pain Assessment: No/denies pain    Home Living                          Prior Function            PT Goals (current goals can now be found in the care plan section) Acute Rehab PT Goals Patient Stated Goal: regain strength, be able to return to short community AMB PT Goal Formulation: With patient Time For Goal Achievement: 10/15/23 Potential to Achieve Goals: Fair Progress towards PT goals: Progressing toward goals    Frequency  Min 2X/week      PT Plan      Co-evaluation              AM-PAC PT "6 Clicks" Mobility   Outcome Measure  Help needed turning from your back to your side while in a flat bed without using bedrails?: A Little Help needed moving from lying on your back to sitting on the side of a flat bed without using bedrails?: A Little Help needed moving to and from a bed to a chair (including a wheelchair)?: A Little Help needed standing up from a chair using your arms (e.g., wheelchair or bedside chair)?: A Little Help needed to walk in hospital room?: A Little Help needed climbing 3-5 steps with a railing? : A Little 6 Click Score: 18    End of Session Equipment Utilized During Treatment: Gait belt Activity Tolerance: Patient tolerated treatment well;Patient limited by fatigue;No increased pain Patient left: in bed;with call bell/phone within reach;with family/visitor present Nurse Communication: Mobility status PT Visit Diagnosis: Difficulty in walking, not elsewhere classified (R26.2);Muscle weakness (generalized)  (M62.81)     Time: 1040-1100 PT Time Calculation (min) (ACUTE ONLY): 20 min  Charges:    $Therapeutic Activity: 8-22 mins PT General Charges $$ ACUTE PT VISIT: 1 Visit                    11:16 AM, 10/03/23 Dawn Eth, PT, DPT Physical Therapist - [facility]  [phone] (ASCOM)     Maleah Rabago C 10/03/2023, 11:10 AM

## 2023-10-03 NOTE — Progress Notes (Signed)
 Date of Admission:  09/30/2023      ID: Zachary [name] is a 83 y.o. male  Principal Problem:   Sepsis (HCC) Active Problems:   Mixed hyperlipidemia   Essential hypertension   Acute kidney injury superimposed on chronic kidney disease (HCC)   Pneumonia   UTI (urinary tract infection)   Acute encephalopathy   CKD (chronic kidney disease) stage 4, GFR 15-29 ml/min (HCC)   NSTEMI (non-ST elevated myocardial infarction) (HCC)   Protein-calorie malnutrition, severe    Subjective: Patient is feeling better Appetite good  Medications:   enoxaparin  (LOVENOX ) injection  30 mg Subcutaneous Q24H   feeding supplement  237 mL Oral TID BM   linezolid   600 mg Oral Q12H   multivitamin with minerals  1 tablet Oral Daily    Objective: Vital signs in last 24 hours: Patient Vitals for the past 24 hrs:  BP Temp Temp src Pulse Resp SpO2  10/03/23 0850 114/68 98.1 F (36.7 C) Oral 86 -- 96 %  10/03/23 0322 103/73 97.7 F (36.5 C) Oral 65 16 95 %  10/02/23 2353 100/63 98 F (36.7 C) Oral 68 16 95 %  10/02/23 1941 101/63 98.5 F (36.9 C) Oral 76 18 92 %      PHYSICAL EXAM:  General: Alert, cooperative, no distress, emaciated Head: Normocephalic, without obvious abnormality, atraumatic. Eyes: Conjunctivae clear, anicteric sclerae. Pupils are equal ENT Nares normal. No drainage or sinus tenderness. Lips, mucosa, and tongue normal. No Thrush Neck: Supple, symmetrical, no adenopathy, thyroid : non tender no carotid bruit and no JVD. Back: No CVA tenderness. Lungs: Clear to auscultation bilaterally. No Wheezing or Rhonchi. No rales. Heart: Regular rate and rhythm, no murmur, rub or gallop. Abdomen: Soft, non-tender,not distended. Bowel sounds normal. No masses Extremities: atraumatic, no cyanosis. No edema. No clubbing Skin: No rashes or lesions. Or bruising Lymph: Cervical, supraclavicular normal. Neurologic: Grossly non-focal  Lab Results    Latest Ref Rng & Units 10/01/2023     4:29 AM 09/30/2023    5:06 AM 09/26/2023    1:00 PM  CBC  WBC 4.0 - 10.5 K/uL 8.1  14.8  11.4   Hemoglobin 13.0 - 17.0 g/dL 8.5  9.2  16.1   Hematocrit 39.0 - 52.0 % 25.2  27.4  32.0   Platelets 150 - 400 K/uL 184  215  300        Latest Ref Rng & Units 10/01/2023    4:29 AM 09/30/2023    5:06 AM 08/26/2023    9:13 AM  CMP  Glucose 70 - 99 mg/dL 95  096  80   BUN 8 - 23 mg/dL 59  67  25   Creatinine 0.61 - 1.24 mg/dL 0.45  4.09  8.11   Sodium 135 - 145 mmol/L 132  129  138   Potassium 3.5 - 5.1 mmol/L 3.9  4.2  4.0   Chloride 98 - 111 mmol/L 104  98  109   CO2 22 - 32 mmol/L 18  20  21    Calcium  8.9 - 10.3 mg/dL 8.2  8.7  8.8   Total Protein 6.5 - 8.1 g/dL 4.6  5.8    Total Bilirubin 0.0 - 1.2 mg/dL 0.7  1.1    Alkaline Phos 38 - 126 U/L 80  76    AST 15 - 41 U/L 26  21    ALT 0 - 44 U/L 22  20        Microbiology: Blood culture MRSA  Studies/Results: CT CHEST WO CONTRAST Result Date: 10/01/2023 CLINICAL DATA:  Weakness, fever, abnormal x-ray EXAM: CT CHEST WITHOUT CONTRAST TECHNIQUE: Multidetector CT imaging of the chest was performed following the standard protocol without IV contrast. RADIATION DOSE REDUCTION: This exam was performed according to the departmental dose-optimization program which includes automated exposure control, adjustment of the mA and/or kV according to patient size and/or use of iterative reconstruction technique. COMPARISON:  03/04/2022, 08/08/2023, 09/30/2023 FINDINGS: Cardiovascular: Unenhanced imaging of the heart is unremarkable, without significant pericardial effusion. 4.5 cm ascending thoracic aortic aneurysm. Atherosclerosis of the aorta and coronary vasculature. Assessment of the vascular lumen cannot be performed without intravenous contrast. Mediastinum/Nodes: No enlarged mediastinal or axillary lymph nodes. Thyroid  gland, trachea, and esophagus demonstrate no significant findings. Moderate hiatal hernia. Lungs/Pleura: There is a thick walled  right upper lobe cavity measuring 9.2 x 7.2 x 7.5 cm, with gas fluid level. There is surrounding dense airspace disease within the right upper lobe as well as within the right middle lobe. Findings are most consistent with cavitary pneumonia, new since the 08/08/2023 CT. There are small free-flowing bilateral pleural effusions, right greater than left. Dependent consolidation within the lower lobes most consistent with atelectasis. No pneumothorax. Central airways are patent. Upper Abdomen: Upper abdominal ascites. Musculoskeletal: No acute or destructive bony abnormalities. Reconstructed images demonstrate no additional findings. IMPRESSION: 1. Large thick walled right upper lobe cavity with gas fluid level, with dense airspace disease involving the right upper and right middle lobes, most consistent with cavitating pneumonia. This has developed since the abdominal CT 08/08/2023. 2. Small free-flowing bilateral pleural effusions. 3. Moderate hiatal hernia. 4. 4.5 cm ascending thoracic aortic aneurysm. Ascending thoracic aortic aneurysm. Recommend semi-annual imaging followup by CTA or MRA and referral to cardiothoracic surgery if not already obtained. This recommendation follows 2010 ACCF/AHA/AATS/ACR/ASA/SCA/SCAI/SIR/STS/SVM Guidelines for the Diagnosis and Management of Patients With Thoracic Aortic Disease. Circulation. 2010; 121: J478-G956. Aortic aneurysm NOS (ICD10-I71.9) 5.  Aortic Atherosclerosis (ICD10-I70.0). 6. Upper abdominal ascites. Electronically Signed   By: Bobbye Burrow M.D.   On: 10/01/2023 20:12     Assessment/Plan: MRSA bacteremia Likely source MRSA cavitary pneumonia Patient was on vancomycin  and then changed to linezolid  and now he is agreeable for treatment so will be going back on IV vancomycin  Will repeat blood cultures Needs 2D echo  Anemia  Weight loss of 40 pounds  History of urinary retention has suprapubic catheter ESBL E. coli and Enterococcus in urine culture.   Likely colonizing the catheter   AKI on CKD Observe closely as he is on vancomycin  If it increases we will have to switch vancomycin  back to linezolid .  Discussed the management with the patient, family and with the hospitalist and pharmacist  ID will not routinely see him this weekend On-call ID available by phone for urgent issues.  Call if needed

## 2023-10-03 NOTE — Consult Note (Signed)
 Consultation Note Date: 10/03/2023 at 1130  Patient Name: Zachary [name]  DOB: 1941-02-02  MRN: [id number]  Age / Sex: 83 y.o., male  PCP: [name], MD Referring Physician: Melodi Sprung, DO  HPI/Patient Profile: 83 y.o. male  with past medical history of HTN, urinary retention s/p suprapubic catheter placement, HLD admitted on 09/30/2023 with weakness.  Of note, hospitalized last month 03/04-03/07/25 w/ substantial urinary retention and AKI, Foley placed, f/u urology and suprapubic cath was placed 08/26/23. Also notable, has been following w/ PCP last visit 09/24/23 concern for unintentional weight loss, SPEP --> pending. Has been following w/ hematology last visit 09/26/23 for anemia and lung nodule, flow cytometry --> neg, CT chest --> pending    04/29: admitted to hospitalist service w/ sepsis, pna, uti, encephalopathy. ICU consult but he did not need pressors 04/30: await cultures, CT chest to eval pna/other RML. Alert today. 05/01: CT demonstrating cavitary pneumonia.  1 blood culture positive, concern for MRSA.  Patient requesting to leave AMA, see progress note and IPAL note.  Will stay at least through tonight.  ID has seen patient, considering for p.o. antibiotics in case patient is wanting to go home tomorrow 05/02: pt has reconsidered for treatment, he states he "made a mistake" yesterday and is now open to continuing IV antibiotics and going ot rehab hopefully same facility as his wife will be going to post-op.  PMT was consulted to support patient and family with goals of care discussion.  Clinical Assessment and Goals of Care: Extensive chart review completed prior to meeting patient including labs, vital signs, imaging, progress notes, orders, and available advanced directive documents from current and previous encounters. I then met with patient at bedside to discuss diagnosis  prognosis, GOC, EOL wishes, disposition and options.  I introduced Palliative Medicine as specialized medical care for people living with serious illness. It focuses on providing relief from the symptoms and stress of a serious illness. The goal is to improve quality of life for both the patient and the family.  We discussed a brief life review of the patient.  Patient works for [employer] and lived in [place] the majority of his life.  He is been married for over 60 years.  He shares that he has not spend more than a day away from his wife since he can remember.  He shares that he is concerned about her health given that she is about to undergo knee surgery.  Therapeutic sounds, active listening, and emotional support provided.  I discussed that patient shared yesterday that he was ready to leave the hospital and to forego medical treatment.  He shares he regrets saying that.  He states he wants to apologize to the nurses and doctors the way he misspoke yesterday.  He shares that he was very upset about being separated from his wife.  However, he is thought more about it and recognizes that he needs to get well in order to be able to help her at home.  He shares  that he is accepting of all offered, available, and appropriate medical interventions to treat his current acute issues.  He remains in agreement with DNR with limited interventions.  We discussed use of antibiotics, and likely transferring to a rehab for improving his mobility and functional status.  He shares he is in agreement with this plan of care.  During our discussion, his wife called and he requested to take the phone call.   I shared that PMT remains available and will continue to follow him throughout his hospitalization.  He was appreciative of our discussion and again wanted to make sure I conveyed his apologies to the medical team for the way he spoke yesterday.  Patient has capacity to make medical decision making  independently at this time.  Additionally, he remains in agreement with current medical recommendations.  Counseled with Dr. Authur Leghorn and conveyed above discussion.  No acute palliative issues at this time.  PMT will continue to follow.  We will monitor the patient peripherally and reengage when appropriate.  Please reach out to PMT should goals change, at patient/family's request, or if patient's health deteriorates during hospitalization.  Primary Decision Maker PATIENT  Physical Exam Constitutional:      General: He is not in acute distress.    Appearance: He is normal weight.  HENT:     Head: Normocephalic.     Mouth/Throat:     Mouth: Mucous membranes are moist.  Eyes:     Pupils: Pupils are equal, round, and reactive to light.  Cardiovascular:     Rate and Rhythm: Normal rate.  Pulmonary:     Effort: Pulmonary effort is normal.  Abdominal:     Palpations: Abdomen is soft.  Musculoskeletal:     Comments: Generalized weakness, MAETC  Skin:    General: Skin is warm and dry.  Neurological:     Mental Status: He is alert and oriented to person, place, and time.  Psychiatric:        Mood and Affect: Mood normal.        Behavior: Behavior normal.        Thought Content: Thought content normal.        Judgment: Judgment normal.     Palliative Assessment/Data: 60-70%     Thank you for this consult. Palliative medicine will continue to follow and assist holistically.   Time Total: 75 minutes  Time spent includes: Detailed review of medical records (labs, imaging, vital signs), medically appropriate exam (mental status, respiratory, cardiac, skin), discussed with treatment team, counseling and educating patient, family and staff, documenting clinical information, medication management and coordination of care.  Signed by: Eller Gut, DNP, FNP-BC Palliative Medicine   Please contact Palliative Medicine Team providers via [facility] for questions and concerns.

## 2023-10-04 DIAGNOSIS — B9562 Methicillin resistant Staphylococcus aureus infection as the cause of diseases classified elsewhere: Secondary | ICD-10-CM | POA: Diagnosis not present

## 2023-10-04 DIAGNOSIS — R7881 Bacteremia: Secondary | ICD-10-CM

## 2023-10-04 DIAGNOSIS — J189 Pneumonia, unspecified organism: Secondary | ICD-10-CM | POA: Diagnosis not present

## 2023-10-04 LAB — CBC WITH DIFFERENTIAL/PLATELET
Abs Immature Granulocytes: 2.71 10*3/uL — ABNORMAL HIGH (ref 0.00–0.07)
Basophils Absolute: 0 10*3/uL (ref 0.0–0.1)
Basophils Relative: 0 %
Eosinophils Absolute: 0.2 10*3/uL (ref 0.0–0.5)
Eosinophils Relative: 1 %
HCT: 29.3 % — ABNORMAL LOW (ref 39.0–52.0)
Hemoglobin: 9.7 g/dL — ABNORMAL LOW (ref 13.0–17.0)
Immature Granulocytes: 18 %
Lymphocytes Relative: 3 %
Lymphs Abs: 0.4 10*3/uL — ABNORMAL LOW (ref 0.7–4.0)
MCH: 32.2 pg (ref 26.0–34.0)
MCHC: 33.1 g/dL (ref 30.0–36.0)
MCV: 97.3 fL (ref 80.0–100.0)
Monocytes Absolute: 0.9 10*3/uL (ref 0.1–1.0)
Monocytes Relative: 6 %
Neutro Abs: 10.9 10*3/uL — ABNORMAL HIGH (ref 1.7–7.7)
Neutrophils Relative %: 72 %
Platelets: 281 10*3/uL (ref 150–400)
RBC: 3.01 MIL/uL — ABNORMAL LOW (ref 4.22–5.81)
RDW: 14.8 % (ref 11.5–15.5)
Smear Review: NORMAL
WBC: 15.1 10*3/uL — ABNORMAL HIGH (ref 4.0–10.5)
nRBC: 0 % (ref 0.0–0.2)

## 2023-10-04 LAB — COMPREHENSIVE METABOLIC PANEL WITH GFR
ALT: 72 U/L — ABNORMAL HIGH (ref 0–44)
AST: 78 U/L — ABNORMAL HIGH (ref 15–41)
Albumin: 1.7 g/dL — ABNORMAL LOW (ref 3.5–5.0)
Alkaline Phosphatase: 152 U/L — ABNORMAL HIGH (ref 38–126)
Anion gap: 11 (ref 5–15)
BUN: 59 mg/dL — ABNORMAL HIGH (ref 8–23)
CO2: 21 mmol/L — ABNORMAL LOW (ref 22–32)
Calcium: 8.1 mg/dL — ABNORMAL LOW (ref 8.9–10.3)
Chloride: 102 mmol/L (ref 98–111)
Creatinine, Ser: 1.7 mg/dL — ABNORMAL HIGH (ref 0.61–1.24)
GFR, Estimated: 40 mL/min — ABNORMAL LOW (ref >60)
Glucose, Bld: 121 mg/dL — ABNORMAL HIGH (ref 70–99)
Potassium: 3.9 mmol/L (ref 3.5–5.1)
Sodium: 134 mmol/L — ABNORMAL LOW (ref 135–145)
Total Bilirubin: 0.6 mg/dL (ref 0.0–1.2)
Total Protein: 4.8 g/dL — ABNORMAL LOW (ref 6.5–8.1)

## 2023-10-04 LAB — T4: T4, Total: 4.4 ug/dL — ABNORMAL LOW (ref 4.5–12.0)

## 2023-10-04 LAB — CULTURE, BLOOD (ROUTINE X 2)

## 2023-10-04 LAB — SPECIMEN STATUS REPORT

## 2023-10-04 MED ORDER — GUAIFENESIN-DM 100-10 MG/5ML PO SYRP
5.0000 mL | ORAL_SOLUTION | ORAL | Status: DC | PRN
  Administered 2023-10-04 – 2023-10-12 (×8): 5 mL via ORAL
  Filled 2023-10-04 (×8): qty 10

## 2023-10-04 NOTE — Progress Notes (Signed)
 PROGRESS NOTE    [name]   NGE:[id number] DOB: Sep 09, 1940  DOA: 09/30/2023 Date of Service: 10/04/23 which is hospital day 4  PCP: Zachary Barters, MD    Hospital course / significant events:   HPI: Zachary [name] is a 83 y.o. male with medical history significant of HTN, urinary retention s/p suprapubic catheter placement,  HLD. Presents to ED from home via EMS w/ weakness.   Of note, hospitalized last month 03/04-03/07/25 w/ substantial urinary retention and AKI, Foley placed, f/u urology and suprapubic cath was placed 08/26/23. Also notable, has been following w/ PCP last visit 09/24/23 concern for unintentional weight loss, SPEP --> pending. Has been following w/ hematology last visit 09/26/23 for anemia and lung nodule, flow cytometry --> neg, CT chest --> pending   04/29: admitted to hospitalist service w/ sepsis, pna, uti, encephalopathy. ICU consult but he did not need pressors 04/30: await cultures, CT chest to eval pna/other RML. Alert today. 05/01: CT demonstrating cavitary pneumonia.  1 blood culture positive, concern for MRSA.  Patient requesting to leave AMA, see progress note and IPAL note.  Will stay at least through tonight.  ID has seen patient, considering for p.o. antibiotics in case patient is wanting to go home tomorrow 05/02: pt has reconsidered for treatment, he states he "made a mistake" yesterday and is now open to continuing IV antibiotics and going ot rehab hopefully same facility as his wife will be going to post-op. Palliative to follow either way.  05/03: repeat BCx negative thus far, continue current IV abx     Consultants:  PCCM Infectious disease Palliative care   Procedures/Surgeries: none      ASSESSMENT & PLAN:   Sepsis POA ESBL UTI Cavitary pneumonia RML MRSA bacteremia Infectious Disease following  Abx per ID, linezolid  possible if not going to continue w/ IV abx  Monitor repeat cultures   Goals of care  discussion Palliative care consult to facilitate GOC discussion and plan In my judgment, patient has capacity to voice his goals for quality of life Has changed his mind from yesterday, I do not think this indicates diminished capacity, he is within his rights to change plans/goals    Elevated troponin likely demand ischemis in setting of sepsis, less likely NSTEMI (non-ST elevated myocardial infarction)  Troponin in the 200s on presentation in setting of sepsis associated with multifocal pneumonia and UTI No active chest pain Suspect secondary demand ischemia Stage IV CKD and poor renal clearance also confounding issues Defer ASA in setting of hematuria  Monitor   CKD (chronic kidney disease) stage 4, GFR 15-29 ml/min  Creatinine 2.4 on presentation with GFR in the 20s Appears to be near baseline Monitor   Acute encephalopathy POA - resolved  Positive generalized lethargy in setting of active sepsis with recurrent multifocal pneumonia and UTI CT head within normal  Monitor   Urinary retention s/p SP cath  History of ESBL E coli infection based on culture 03/12 UA indicative of infection POA 08/2023 Urine culture showing ESBL E coli  Abx as above per ID Suprapubic catheter exchanged in the ER  Follow cultures Keep cath site clean/dry    Essential hypertension Hold BP regimen in setting hypotension assd with sepsis  Monitor     Mixed hyperlipidemia hold lipitor          underweight based on BMI: Body mass index is 16.05 kg/m.[name] Significantly low or high BMI is associated with higher medical risk.  Underweight - under  18  overweight - 25 to 29 obese - 30 or more Class 1 obesity: BMI of 30.0 to 34 Class 2 obesity: BMI of 35.0 to 39 Class 3 obesity: BMI of 40.0 to 49 Super Morbid Obesity: BMI 50-59 Super-super Morbid Obesity: BMI 60+ Healthy nutrition and physical activity advised as adjunct to other disease management and risk reduction treatments    DVT  prophylaxis: lovenox   IV fluids: none at this time  Nutrition: per dietician Central lines / other devices: suprapubic cath  Code Status: DNR ACP documentation reviewed:  none on file in [facility]  [facility] needs: SNF rehab Medical barriers to dispo: sepsis/infection awiting cultures and clinical improvement. Expected medical readiness for discharge several more days.              Subjective / Brief ROS:  Patient has no concerns today  Denies CP/SOB.  Pain controlled.  Denies new weakness.  Tolerating diet.   Family Communication: none at bedside      Objective Findings:  Vitals:   10/04/23 0017 10/04/23 0340 10/04/23 0817 10/04/23 1202  BP: 96/65 102/66 97/62 109/61  Pulse: (!) 55 61 (!) 59 (!) 40  Resp: 18 20    Temp: 98.9 F (37.2 C) 97.6 F (36.4 C)  97.8 F (36.6 C)  TempSrc:      SpO2: 97% 95% 98% 98%  Weight:      Height:        Intake/Output Summary (Last 24 hours) at 10/04/2023 1623 Last data filed at 10/04/2023 1525 Gross per 24 hour  Intake 450.91 ml  Output 1050 ml  Net -599.09 ml   Filed Weights   09/30/23 0457  Weight: 56.7 kg    Examination:  Physical Exam Constitutional:      General: He is not in acute distress.    Appearance: He is not ill-appearing (frail, thin) or toxic-appearing.  Cardiovascular:     Rate and Rhythm: Normal rate and regular rhythm.  Pulmonary:     Effort: Pulmonary effort is normal.     Breath sounds: Normal breath sounds.  Abdominal:     General: Bowel sounds are normal. There is no distension.  Musculoskeletal:     Right lower leg: No edema.     Left lower leg: No edema.  Neurological:     General: No focal deficit present.     Mental Status: He is alert and oriented to person, place, and time. Mental status is at baseline.  Psychiatric:        Mood and Affect: Mood normal.        Behavior: Behavior normal.          Scheduled Medications:   Chlorhexidine  Gluconate Cloth  6 each Topical Q0600    enoxaparin  (LOVENOX ) injection  30 mg Subcutaneous Q24H   feeding supplement  237 mL Oral TID BM   multivitamin with minerals  1 tablet Oral Daily    Continuous Infusions:  vancomycin  Stopped (10/04/23 1458)     PRN Medications:  acetaminophen  **OR** acetaminophen , guaiFENesin-dextromethorphan, ondansetron  **OR** ondansetron  (ZOFRAN ) IV  Antimicrobials from admission:  Anti-infectives (From admission, onward)    Start     Dose/Rate Route Frequency Ordered Stop   10/04/23 1400  vancomycin  (VANCOREADY) IVPB 500 mg/100 mL        500 mg 100 mL/hr over 60 Minutes Intravenous Every 24 hours 10/03/23 1624     10/03/23 1800  vancomycin  (VANCOREADY) IVPB 500 mg/100 mL        500 mg  100 mL/hr over 60 Minutes Intravenous  Once 10/03/23 1624 10/03/23 1859   10/02/23 2200  linezolid  (ZYVOX ) tablet 600 mg  Status:  Discontinued        600 mg Oral Every 12 hours 10/02/23 1550 10/03/23 1501   10/01/23 0900  vancomycin  (VANCOCIN ) IVPB 1000 mg/200 mL premix  Status:  Discontinued        1,000 mg 200 mL/hr over 60 Minutes Intravenous Every 48 hours 10/01/23 0736 10/02/23 1550   10/01/23 0800  vancomycin  variable dose per unstable renal function (pharmacist dosing)  Status:  Discontinued         Does not apply See admin instructions 09/30/23 0747 10/01/23 0738   09/30/23 1000  meropenem  (MERREM ) 500 mg in sodium chloride  0.9 % 100 mL IVPB  Status:  Discontinued        500 mg 200 mL/hr over 30 Minutes Intravenous Every 12 hours 09/30/23 0728 10/02/23 1550   09/30/23 0500  ceFEPIme  (MAXIPIME ) 2 g in sodium chloride  0.9 % 100 mL IVPB        2 g 200 mL/hr over 30 Minutes Intravenous  Once 09/30/23 0458 09/30/23 0608   09/30/23 0500  metroNIDAZOLE  (FLAGYL ) IVPB 500 mg        500 mg 100 mL/hr over 60 Minutes Intravenous  Once 09/30/23 0458 09/30/23 0826   09/30/23 0500  vancomycin  (VANCOCIN ) IVPB 1000 mg/200 mL premix        1,000 mg 200 mL/hr over 60 Minutes Intravenous  Once 09/30/23 0458 09/30/23  1610           Data Reviewed:  I have personally reviewed the following...  CBC: Recent Labs  Lab 09/30/23 0506 10/01/23 0429 10/04/23 0432  WBC 14.8* 8.1 15.1*  NEUTROABS 12.8*  --  10.9*  HGB 9.2* 8.5* 9.7*  HCT 27.4* 25.2* 29.3*  MCV 99.6 98.1 97.3  PLT 215 184 281   Basic Metabolic Panel: Recent Labs  Lab 09/30/23 0506 10/01/23 0429 10/04/23 0432  NA 129* 132* 134*  K 4.2 3.9 3.9  CL 98 104 102  CO2 20* 18* 21*  GLUCOSE 135* 95 121*  BUN 67* 59* 59*  CREATININE 2.41* 2.05* 1.70*  CALCIUM  8.7* 8.2* 8.1*  MG 2.1  --   --    GFR: Estimated Creatinine Clearance: 27.8 mL/min (A) (by C-G formula based on SCr of 1.7 mg/dL (H)). Liver Function Tests: Recent Labs  Lab 09/30/23 0506 10/01/23 0429 10/04/23 0432  AST 21 26 78*  ALT 20 22 72*  ALKPHOS 76 80 152*  BILITOT 1.1 0.7 0.6  PROT 5.8* 4.6* 4.8*  ALBUMIN 2.4* 1.7* 1.7*   No results for input(s): "LIPASE", "AMYLASE" in the last 168 hours. No results for input(s): "AMMONIA" in the last 168 hours. Coagulation Profile: Recent Labs  Lab 09/30/23 0506 09/30/23 0909  INR 1.2 2.3*   Cardiac Enzymes: No results for input(s): "CKTOTAL", "CKMB", "CKMBINDEX", "TROPONINI" in the last 168 hours. BNP (last 3 results) No results for input(s): "PROBNP" in the last 8760 hours. HbA1C: No results for input(s): "HGBA1C" in the last 72 hours. CBG: No results for input(s): "GLUCAP" in the last 168 hours. Lipid Profile: No results for input(s): "CHOL", "HDL", "LDLCALC", "TRIG", "CHOLHDL", "LDLDIRECT" in the last 72 hours. Thyroid  Function Tests: No results for input(s): "TSH", "T4TOTAL", "FREET4", "T3FREE", "THYROIDAB" in the last 72 hours. Anemia Panel: No results for input(s): "VITAMINB12", "FOLATE", "FERRITIN", "TIBC", "IRON", "RETICCTPCT" in the last 72 hours. Most Recent Urinalysis On File:  Component Value Date/Time   COLORURINE YELLOW (A) 09/30/2023 0557   APPEARANCEUR CLOUDY (A) 09/30/2023 0557    APPEARANCEUR Hazy (A) 08/12/2023 1141   LABSPEC 1.015 09/30/2023 0557   PHURINE 5.0 09/30/2023 0557   GLUCOSEU NEGATIVE 09/30/2023 0557   HGBUR SMALL (A) 09/30/2023 0557   BILIRUBINUR NEGATIVE 09/30/2023 0557   BILIRUBINUR Negative 08/12/2023 1141   KETONESUR NEGATIVE 09/30/2023 0557   PROTEINUR 30 (A) 09/30/2023 0557   UROBILINOGEN 1.0 06/30/2019 0823   NITRITE NEGATIVE 09/30/2023 0557   LEUKOCYTESUR LARGE (A) 09/30/2023 0557   Sepsis Labs: @LABRCNTIP (procalcitonin:4,lacticidven:4) Microbiology: Recent Results (from the past 240 hours)  Resp panel by RT-PCR (RSV, Flu A&B, Covid) Anterior Nasal Swab     Status: None   Collection Time: 09/30/23  5:06 AM   Specimen: Anterior Nasal Swab  Result Value Ref Range Status   SARS Coronavirus 2 by RT PCR NEGATIVE NEGATIVE Final    Comment: (NOTE) SARS-CoV-2 target nucleic acids are NOT DETECTED.  The SARS-CoV-2 RNA is generally detectable in upper respiratory specimens during the acute phase of infection. The lowest concentration of SARS-CoV-2 viral copies this assay can detect is 138 copies/mL. A negative result does not preclude SARS-Cov-2 infection and should not be used as the sole basis for treatment or other patient management decisions. A negative result may occur with  improper specimen collection/handling, submission of specimen other than nasopharyngeal swab, presence of viral mutation(s) within the areas targeted by this assay, and inadequate number of viral copies(<138 copies/mL). A negative result must be combined with clinical observations, patient history, and epidemiological information. The expected result is Negative.  Fact Sheet for Patients:  BloggerCourse.com  Fact Sheet for Healthcare Providers:  SeriousBroker.it  This test is no t yet approved or cleared by the [place]  FDA and  has been authorized for detection and/or diagnosis of SARS-CoV-2 by FDA under  an Emergency Use Authorization (EUA). This EUA will remain  in effect (meaning this test can be used) for the duration of the COVID-19 declaration under Section 564(b)(1) of the Act, 21 U.S.C.section 360bbb-3(b)(1), unless the authorization is terminated  or revoked sooner.       Influenza A by PCR NEGATIVE NEGATIVE Final   Influenza B by PCR NEGATIVE NEGATIVE Final    Comment: (NOTE) The Xpert Xpress SARS-CoV-2/FLU/RSV plus assay is intended as an aid in the diagnosis of influenza from Nasopharyngeal swab specimens and should not be used as a sole basis for treatment. Nasal washings and aspirates are unacceptable for Xpert Xpress SARS-CoV-2/FLU/RSV testing.  Fact Sheet for Patients: BloggerCourse.com  Fact Sheet for Healthcare Providers: SeriousBroker.it  This test is not yet approved or cleared by the [place]  FDA and has been authorized for detection and/or diagnosis of SARS-CoV-2 by FDA under an Emergency Use Authorization (EUA). This EUA will remain in effect (meaning this test can be used) for the duration of the COVID-19 declaration under Section 564(b)(1) of the Act, 21 U.S.C. section 360bbb-3(b)(1), unless the authorization is terminated or revoked.     Resp Syncytial Virus by PCR NEGATIVE NEGATIVE Final    Comment: (NOTE) Fact Sheet for Patients: BloggerCourse.com  Fact Sheet for Healthcare Providers: SeriousBroker.it  This test is not yet approved or cleared by the [place]  FDA and has been authorized for detection and/or diagnosis of SARS-CoV-2 by FDA under an Emergency Use Authorization (EUA). This EUA will remain in effect (meaning this test can be used) for the duration of the COVID-19 declaration under Section 564(b)(1) of the  Act, 21 U.S.C. section 360bbb-3(b)(1), unless the authorization is terminated or revoked.  Performed at [facility], [address], [place], [place] [address]   Blood Culture (routine x 2)     Status: Abnormal   Collection Time: 09/30/23  5:06 AM   Specimen: Left Antecubital; Blood  Result Value Ref Range Status   Specimen Description   Final    LEFT ANTECUBITAL Performed at [facility], [address], [place], [place] [address]    Special Requests   Final    BOTTLES DRAWN AEROBIC AND ANAEROBIC Blood Culture results may not be optimal due to an inadequate volume of blood received in culture bottles Performed at [facility], [address] [place], [place] [address]    Culture  Setup Time   Final    GRAM POSITIVE COCCI AEROBIC BOTTLE ONLY CRITICAL RESULT CALLED TO, READ BACK BY AND VERIFIED WITH:  NATHAN BELUE AT 0401 10/02/23 JG Performed at [facility], [address] [place], [place] [address]    Culture METHICILLIN RESISTANT STAPHYLOCOCCUS AUREUS (A)  Final   Report Status 10/04/2023 FINAL  Final   Organism ID, Bacteria METHICILLIN RESISTANT STAPHYLOCOCCUS AUREUS  Final      Susceptibility   Methicillin resistant staphylococcus aureus - MIC*    CIPROFLOXACIN  >=8 RESISTANT Resistant     ERYTHROMYCIN >=8 RESISTANT Resistant     GENTAMICIN <=0.5 SENSITIVE Sensitive     OXACILLIN >=4 RESISTANT Resistant     TETRACYCLINE <=1 SENSITIVE Sensitive     VANCOMYCIN  1 SENSITIVE Sensitive     TRIMETH /SULFA  <=10 SENSITIVE Sensitive     CLINDAMYCIN <=0.25 SENSITIVE Sensitive     RIFAMPIN <=0.5 SENSITIVE Sensitive     Inducible Clindamycin NEGATIVE Sensitive     LINEZOLID  2 SENSITIVE Sensitive     * METHICILLIN RESISTANT STAPHYLOCOCCUS AUREUS  Blood Culture (routine x 2)     Status: None (Preliminary result)   Collection Time: 09/30/23  5:06 AM   Specimen: BLOOD  Result Value Ref Range Status   Specimen Description BLOOD RIGTH [facility]  Final   Special Requests   Final    BOTTLES DRAWN AEROBIC AND ANAEROBIC Blood Culture adequate volume   Culture   Final    NO GROWTH 4  DAYS Performed at [facility], [address] [place], [place] [address]    Report Status PENDING  Incomplete  Blood Culture ID Panel (Reflexed)     Status: Abnormal   Collection Time: 09/30/23  5:06 AM  Result Value Ref Range Status   Enterococcus faecalis NOT DETECTED NOT DETECTED Final   Enterococcus Faecium NOT DETECTED NOT DETECTED Final   Listeria monocytogenes NOT DETECTED NOT DETECTED Final   Staphylococcus species DETECTED (A) NOT DETECTED Final    Comment: CRITICAL RESULT CALLED TO, READ BACK BY AND VERIFIED WITH:  NATHAN BELUE AT 0401 10/02/23 JG    Staphylococcus aureus (BCID) DETECTED (A) NOT DETECTED Final    Comment: Methicillin (oxacillin)-resistant Staphylococcus aureus (MRSA). MRSA is predictably resistant to beta-lactam antibiotics (except ceftaroline). Preferred therapy is vancomycin  unless clinically contraindicated. Patient requires contact precautions if  hospitalized. CRITICAL RESULT CALLED TO, READ BACK BY AND VERIFIED WITH:  NATHAN BELUE AT 0401 10/02/23 JG    Staphylococcus epidermidis NOT DETECTED NOT DETECTED Final   Staphylococcus lugdunensis NOT DETECTED NOT DETECTED Final   Streptococcus species NOT DETECTED NOT DETECTED Final   Streptococcus agalactiae NOT DETECTED NOT DETECTED Final   Streptococcus pneumoniae NOT DETECTED NOT  DETECTED Final   Streptococcus pyogenes NOT DETECTED NOT DETECTED Final   A.calcoaceticus-baumannii NOT DETECTED NOT DETECTED Final   Bacteroides fragilis NOT DETECTED NOT DETECTED Final   Enterobacterales NOT DETECTED NOT DETECTED Final   Enterobacter cloacae complex NOT DETECTED NOT DETECTED Final   Escherichia coli NOT DETECTED NOT DETECTED Final   Klebsiella aerogenes NOT DETECTED NOT DETECTED Final   Klebsiella oxytoca NOT DETECTED NOT DETECTED Final   Klebsiella pneumoniae NOT DETECTED NOT DETECTED Final   Proteus species NOT DETECTED NOT DETECTED Final   Salmonella species NOT DETECTED NOT DETECTED Final    Serratia marcescens NOT DETECTED NOT DETECTED Final   Haemophilus influenzae NOT DETECTED NOT DETECTED Final   Neisseria meningitidis NOT DETECTED NOT DETECTED Final   Pseudomonas aeruginosa NOT DETECTED NOT DETECTED Final   Stenotrophomonas maltophilia NOT DETECTED NOT DETECTED Final   Candida albicans NOT DETECTED NOT DETECTED Final   Candida auris NOT DETECTED NOT DETECTED Final   Candida glabrata NOT DETECTED NOT DETECTED Final   Candida krusei NOT DETECTED NOT DETECTED Final   Candida parapsilosis NOT DETECTED NOT DETECTED Final   Candida tropicalis NOT DETECTED NOT DETECTED Final   Cryptococcus neoformans/gattii NOT DETECTED NOT DETECTED Final   Meth resistant mecA/C and MREJ DETECTED (A) NOT DETECTED Final    Comment: CRITICAL RESULT CALLED TO, READ BACK BY AND VERIFIED WITHNoe Bath AT 0401 10/02/23 JG Performed at [facility], [address] [place], [place] [address]   Urine Culture     Status: Abnormal   Collection Time: 09/30/23  5:57 AM   Specimen: Urine, Suprapubic  Result Value Ref Range Status   Specimen Description   Final    URINE, SUPRAPUBIC Performed at [facility], [address], [place], [place] [address]    Special Requests   Final    NONE Reflexed from [id number] Performed at [facility], [address] [place], [place] [address]    Culture (A)  Final    >=100,000 COLONIES/mL ESCHERICHIA COLI Confirmed Extended Spectrum Beta-Lactamase Producer (ESBL).  In bloodstream infections from ESBL organisms, carbapenems are preferred over piperacillin/tazobactam. They are shown to have a lower risk of mortality. >=100,000 COLONIES/mL ENTEROCOCCUS FAECALIS    Report Status 10/02/2023 FINAL  Final   Organism ID, Bacteria ESCHERICHIA COLI (A)  Final   Organism ID, Bacteria ENTEROCOCCUS FAECALIS (A)  Final      Susceptibility   Escherichia coli - MIC*    AMPICILLIN  >=32 RESISTANT Resistant     CEFAZOLIN >=64 RESISTANT Resistant      CEFEPIME  8 INTERMEDIATE Intermediate     CEFTRIAXONE  >=64 RESISTANT Resistant     CIPROFLOXACIN  >=4 RESISTANT Resistant     GENTAMICIN <=1 SENSITIVE Sensitive     IMIPENEM 0.5 SENSITIVE Sensitive     NITROFURANTOIN <=16 SENSITIVE Sensitive     TRIMETH /SULFA  >=320 RESISTANT Resistant     AMPICILLIN /SULBACTAM 4 SENSITIVE Sensitive     PIP/TAZO <=4 SENSITIVE Sensitive ug/mL    * >=100,000 COLONIES/mL ESCHERICHIA COLI   Enterococcus faecalis - MIC*    AMPICILLIN  <=2 SENSITIVE Sensitive     NITROFURANTOIN <=16 SENSITIVE Sensitive     VANCOMYCIN  1 SENSITIVE Sensitive     * >=100,000 COLONIES/mL ENTEROCOCCUS FAECALIS  MRSA Next Gen by PCR, Nasal     Status: Abnormal   Collection Time: 10/01/23 10:05 PM   Specimen: Nasal Mucosa; Nasal Swab  Result Value Ref Range Status   MRSA by PCR Next Gen DETECTED (A) NOT  DETECTED Final    Comment: RESULT CALLED TO, READ BACK BY AND VERIFIED WITH:  ERICKA KLARAS AT 5409 10/02/23 JG (NOTE) The GeneXpert MRSA Assay (FDA approved for NASAL specimens only), is one component of a comprehensive MRSA colonization surveillance program. It is not intended to diagnose MRSA infection nor to guide or monitor treatment for MRSA infections. Test performance is not FDA approved in patients less than 2 years old. Performed at [facility], [address] [place], [place] [address]   Culture, blood (Routine X 2) w Reflex to ID Panel     Status: None (Preliminary result)   Collection Time: 10/03/23 11:02 AM   Specimen: BLOOD  Result Value Ref Range Status   Specimen Description BLOOD RIGHT ANTECUBITAL  Final   Special Requests   Final    BOTTLES DRAWN AEROBIC AND ANAEROBIC Blood Culture adequate volume   Culture   Final    NO GROWTH < 24 HOURS Performed at [facility], [address], [place], [place] [address]    Report Status PENDING  Incomplete  Culture, blood (Routine X 2) w Reflex to ID Panel     Status: None (Preliminary result)    Collection Time: 10/03/23 11:10 AM   Specimen: BLOOD  Result Value Ref Range Status   Specimen Description BLOOD BLOOD RIGHT FOREARM  Final   Special Requests   Final    BOTTLES DRAWN AEROBIC AND ANAEROBIC Blood Culture adequate volume   Culture   Final    NO GROWTH < 24 HOURS Performed at [facility], [address] [place], [place] [address]    Report Status PENDING  Incomplete      Radiology Studies last 3 days: CT CHEST WO CONTRAST Result Date: 10/01/2023 CLINICAL DATA:  Weakness, fever, abnormal x-ray EXAM: CT CHEST WITHOUT CONTRAST TECHNIQUE: Multidetector CT imaging of the chest was performed following the standard protocol without IV contrast. RADIATION DOSE REDUCTION: This exam was performed according to the departmental dose-optimization program which includes automated exposure control, adjustment of the mA and/or kV according to patient size and/or use of iterative reconstruction technique. COMPARISON:  03/04/2022, 08/08/2023, 09/30/2023 FINDINGS: Cardiovascular: Unenhanced imaging of the heart is unremarkable, without significant pericardial effusion. 4.5 cm ascending thoracic aortic aneurysm. Atherosclerosis of the aorta and coronary vasculature. Assessment of the vascular lumen cannot be performed without intravenous contrast. Mediastinum/Nodes: No enlarged mediastinal or axillary lymph nodes. Thyroid  gland, trachea, and esophagus demonstrate no significant findings. Moderate hiatal hernia. Lungs/Pleura: There is a thick walled right upper lobe cavity measuring 9.2 x 7.2 x 7.5 cm, with gas fluid level. There is surrounding dense airspace disease within the right upper lobe as well as within the right middle lobe. Findings are most consistent with cavitary pneumonia, new since the 08/08/2023 CT. There are small free-flowing bilateral pleural effusions, right greater than left. Dependent consolidation within the lower lobes most consistent with atelectasis. No pneumothorax.  Central airways are patent. Upper Abdomen: Upper abdominal ascites. Musculoskeletal: No acute or destructive bony abnormalities. Reconstructed images demonstrate no additional findings. IMPRESSION: 1. Large thick walled right upper lobe cavity with gas fluid level, with dense airspace disease involving the right upper and right middle lobes, most consistent with cavitating pneumonia. This has developed since the abdominal CT 08/08/2023. 2. Small free-flowing bilateral pleural effusions. 3. Moderate hiatal hernia. 4. 4.5 cm ascending thoracic aortic aneurysm. Ascending thoracic aortic aneurysm. Recommend semi-annual imaging followup by CTA or MRA and referral to cardiothoracic surgery if not already obtained. This  recommendation follows 2010 ACCF/AHA/AATS/ACR/ASA/SCA/SCAI/SIR/STS/SVM Guidelines for the Diagnosis and Management of Patients With Thoracic Aortic Disease. Circulation. 2010; 121: O130-Q657. Aortic aneurysm NOS (ICD10-I71.9) 5.  Aortic Atherosclerosis (ICD10-I70.0). 6. Upper abdominal ascites. Electronically Signed   By: Bobbye Burrow M.D.   On: 10/01/2023 20:12       Time spent: 50 min     Crayton Savarese, DO [facility] 10/04/2023, 4:23 PM    Dictation software may have been used to generate the above note. Typos may occur and escape review in typed/dictated notes. Please contact Dr Authur Leghorn directly for clarity if needed.  Staff may message me via secure chat in Epic  but this may not receive an immediate response,  please page me for urgent matters!  If 7PM-7AM, please contact night coverage [email or web address]

## 2023-10-04 NOTE — Plan of Care (Signed)

## 2023-10-05 DIAGNOSIS — G9341 Metabolic encephalopathy: Secondary | ICD-10-CM | POA: Diagnosis not present

## 2023-10-05 DIAGNOSIS — A419 Sepsis, unspecified organism: Secondary | ICD-10-CM | POA: Diagnosis not present

## 2023-10-05 DIAGNOSIS — R6520 Severe sepsis without septic shock: Secondary | ICD-10-CM | POA: Diagnosis not present

## 2023-10-05 LAB — CULTURE, BLOOD (ROUTINE X 2)
Culture: NO GROWTH
Special Requests: ADEQUATE

## 2023-10-05 LAB — BLOOD GAS, VENOUS
Acid-base deficit: 9 mmol/L — ABNORMAL HIGH (ref 0.0–2.0)
Bicarbonate: 16.1 mmol/L — ABNORMAL LOW (ref 20.0–28.0)
O2 Saturation: 47.4 %
Patient temperature: 37
pCO2, Ven: 32 mmHg — ABNORMAL LOW (ref 44–60)
pH, Ven: 7.31 (ref 7.25–7.43)

## 2023-10-05 LAB — CREATININE, SERUM
Creatinine, Ser: 1.6 mg/dL — ABNORMAL HIGH (ref 0.61–1.24)
GFR, Estimated: 43 mL/min — ABNORMAL LOW (ref >60)

## 2023-10-05 LAB — VANCOMYCIN, PEAK: Vancomycin Pk: 18 ug/mL — ABNORMAL LOW (ref 30–40)

## 2023-10-05 NOTE — Plan of Care (Signed)

## 2023-10-05 NOTE — Progress Notes (Signed)
 PROGRESS NOTE    Zachary [name]   YQM:[id number] DOB: 10-29-1940  DOA: 09/30/2023 Date of Service: 10/05/23 which is hospital day 5  PCP: [name], MD    Hospital course / significant events:   HPI: ONOFRIO [name] is a 83 y.o. male with medical history significant of HTN, urinary retention s/p suprapubic catheter placement,  HLD. Presents to ED from home via EMS w/ weakness.   Of note, hospitalized last month 03/04-03/07/25 w/ substantial urinary retention and AKI, Foley placed, f/u urology and suprapubic cath was placed 08/26/23. Also notable, has been following w/ PCP last visit 09/24/23 concern for unintentional weight loss, SPEP --> pending. Has been following w/ hematology last visit 09/26/23 for anemia and lung nodule, flow cytometry --> neg, CT chest --> pending   04/29: admitted to hospitalist service w/ sepsis, pna, uti, encephalopathy. ICU consult but he did not need pressors 04/30: await cultures, CT chest to eval pna/other RML. Alert today. 05/01: CT demonstrating cavitary pneumonia.  1 blood culture positive, concern for MRSA.  Patient requesting to leave AMA, see progress note and IPAL note.  Will stay at least through tonight.  ID has seen patient, considering for p.o. antibiotics in case patient is wanting to go home tomorrow 05/02: pt has reconsidered for treatment, he states he "made a mistake" yesterday and is now open to continuing IV antibiotics and going ot rehab hopefully same facility as his wife will be going to post-op. Palliative to follow either way.  05/03-05/04: repeat BCx negative thus far, continue current IV abx, placement to follow after the weekend     Consultants:  PCCM Infectious disease Palliative care   Procedures/Surgeries: none      ASSESSMENT & PLAN:   Sepsis POA ESBL UTI Cavitary pneumonia RML MRSA bacteremia Infectious Disease following  Abx per ID, linezolid  po may be possible if not going to continue w/ IV  abx but for now plan is to continue vanc  Monitor repeat cultures   Goals of care discussion Palliative care consult to facilitate GOC discussion and plan In my judgment, patient has capacity to voice his goals for quality of life   Elevated troponin likely demand ischemis in setting of sepsis, less likely NSTEMI (non-ST elevated myocardial infarction)  Troponin in the 200s on presentation in setting of sepsis associated with multifocal pneumonia and UTI No active chest pain Suspect secondary demand ischemia Stage IV CKD and poor renal clearance also confounding issues Defer ASA in setting of hematuria  Monitor   CKD (chronic kidney disease) stage 4, GFR 15-29 ml/min  Creatinine 2.4 on presentation with GFR in the 20s Appears to be near baseline Monitor   Acute encephalopathy POA - resolved  Positive generalized lethargy in setting of active sepsis with recurrent multifocal pneumonia and UTI CT head within normal  Monitor   Urinary retention s/p SP cath  History of ESBL E coli infection based on culture 03/12 UA indicative of infection POA 08/2023 Urine culture showing ESBL E coli  Abx as above per ID Suprapubic catheter exchanged in the ER  Follow cultures Keep cath site clean/dry    Essential hypertension Hold BP regimen in setting hypotension assd with sepsis  Monitor     Mixed hyperlipidemia hold lipitor          underweight based on BMI: Body mass index is 16.05 kg/m.[name] Significantly low or high BMI is associated with higher medical risk.  Underweight - under 18  overweight - 25 to  29 obese - 30 or more Class 1 obesity: BMI of 30.0 to 34 Class 2 obesity: BMI of 35.0 to 39 Class 3 obesity: BMI of 40.0 to 49 Super Morbid Obesity: BMI 50-59 Super-super Morbid Obesity: BMI 60+ Healthy nutrition and physical activity advised as adjunct to other disease management and risk reduction treatments    DVT prophylaxis: lovenox   IV fluids: none at this time   Nutrition: per dietician Central lines / other devices: suprapubic cath  Code Status: DNR ACP documentation reviewed:  none on file in [facility]  [facility] needs: SNF rehab Medical barriers to dispo: sepsis/infection awiting ID recs and clinical improvement. Expected medical readiness for discharge possibly tomorrow              Subjective / Brief ROS:  Patient has no concerns today    Family Communication: none at bedside      Objective Findings:  Vitals:   10/05/23 0511 10/05/23 0848 10/05/23 1140 10/05/23 1300  BP: 102/86 107/75 99/71 102/70  Pulse: (!) 51 97 (!) 45 62  Resp: 18 20 20 19   Temp: 97.8 F (36.6 C) (!) 97.3 F (36.3 C) 99.7 F (37.6 C) 97.8 F (36.6 C)  TempSrc:    Oral  SpO2: 96% 96% 94% 95%  Weight:      Height:        Intake/Output Summary (Last 24 hours) at 10/05/2023 1304 Last data filed at 10/05/2023 0518 Gross per 24 hour  Intake 470.83 ml  Output 250 ml  Net 220.83 ml   Filed Weights   09/30/23 0457  Weight: 56.7 kg    Examination:  Physical Exam Constitutional:      General: He is not in acute distress.    Appearance: He is not ill-appearing (frail, thin) or toxic-appearing.  Cardiovascular:     Rate and Rhythm: Normal rate and regular rhythm.  Pulmonary:     Effort: Pulmonary effort is normal.     Breath sounds: Normal breath sounds.  Abdominal:     General: Bowel sounds are normal. There is no distension.  Musculoskeletal:     Right lower leg: No edema.     Left lower leg: No edema.  Neurological:     General: No focal deficit present.     Mental Status: He is alert and oriented to person, place, and time. Mental status is at baseline.  Psychiatric:        Mood and Affect: Mood normal.        Behavior: Behavior normal.          Scheduled Medications:   Chlorhexidine  Gluconate Cloth  6 each Topical Q0600   enoxaparin  (LOVENOX ) injection  30 mg Subcutaneous Q24H   feeding supplement  237 mL Oral TID BM    multivitamin with minerals  1 tablet Oral Daily    Continuous Infusions:  vancomycin  Stopped (10/04/23 1458)     PRN Medications:  acetaminophen  **OR** acetaminophen , guaiFENesin-dextromethorphan, ondansetron  **OR** ondansetron  (ZOFRAN ) IV  Antimicrobials from admission:  Anti-infectives (From admission, onward)    Start     Dose/Rate Route Frequency Ordered Stop   10/04/23 1400  vancomycin  (VANCOREADY) IVPB 500 mg/100 mL        500 mg 100 mL/hr over 60 Minutes Intravenous Every 24 hours 10/03/23 1624     10/03/23 1800  vancomycin  (VANCOREADY) IVPB 500 mg/100 mL        500 mg 100 mL/hr over 60 Minutes Intravenous  Once 10/03/23 1624 10/03/23 1859   10/02/23  2200  linezolid  (ZYVOX ) tablet 600 mg  Status:  Discontinued        600 mg Oral Every 12 hours 10/02/23 1550 10/03/23 1501   10/01/23 0900  vancomycin  (VANCOCIN ) IVPB 1000 mg/200 mL premix  Status:  Discontinued        1,000 mg 200 mL/hr over 60 Minutes Intravenous Every 48 hours 10/01/23 0736 10/02/23 1550   10/01/23 0800  vancomycin  variable dose per unstable renal function (pharmacist dosing)  Status:  Discontinued         Does not apply See admin instructions 09/30/23 0747 10/01/23 0738   09/30/23 1000  meropenem  (MERREM ) 500 mg in sodium chloride  0.9 % 100 mL IVPB  Status:  Discontinued        500 mg 200 mL/hr over 30 Minutes Intravenous Every 12 hours 09/30/23 0728 10/02/23 1550   09/30/23 0500  ceFEPIme  (MAXIPIME ) 2 g in sodium chloride  0.9 % 100 mL IVPB        2 g 200 mL/hr over 30 Minutes Intravenous  Once 09/30/23 0458 09/30/23 0608   09/30/23 0500  metroNIDAZOLE  (FLAGYL ) IVPB 500 mg        500 mg 100 mL/hr over 60 Minutes Intravenous  Once 09/30/23 0458 09/30/23 0826   09/30/23 0500  vancomycin  (VANCOCIN ) IVPB 1000 mg/200 mL premix        1,000 mg 200 mL/hr over 60 Minutes Intravenous  Once 09/30/23 0458 09/30/23 1610           Data Reviewed:  I have personally reviewed the following...  CBC: Recent  Labs  Lab 09/30/23 0506 10/01/23 0429 10/04/23 0432  WBC 14.8* 8.1 15.1*  NEUTROABS 12.8*  --  10.9*  HGB 9.2* 8.5* 9.7*  HCT 27.4* 25.2* 29.3*  MCV 99.6 98.1 97.3  PLT 215 184 281   Basic Metabolic Panel: Recent Labs  Lab 09/30/23 0506 10/01/23 0429 10/04/23 0432 10/05/23 0424  NA 129* 132* 134*  --   K 4.2 3.9 3.9  --   CL 98 104 102  --   CO2 20* 18* 21*  --   GLUCOSE 135* 95 121*  --   BUN 67* 59* 59*  --   CREATININE 2.41* 2.05* 1.70* 1.60*  CALCIUM  8.7* 8.2* 8.1*  --   MG 2.1  --   --   --    GFR: Estimated Creatinine Clearance: 29.5 mL/min (A) (by C-G formula based on SCr of 1.6 mg/dL (H)). Liver Function Tests: Recent Labs  Lab 09/30/23 0506 10/01/23 0429 10/04/23 0432  AST 21 26 78*  ALT 20 22 72*  ALKPHOS 76 80 152*  BILITOT 1.1 0.7 0.6  PROT 5.8* 4.6* 4.8*  ALBUMIN 2.4* 1.7* 1.7*   No results for input(s): "LIPASE", "AMYLASE" in the last 168 hours. No results for input(s): "AMMONIA" in the last 168 hours. Coagulation Profile: Recent Labs  Lab 09/30/23 0506 09/30/23 0909  INR 1.2 2.3*   Cardiac Enzymes: No results for input(s): "CKTOTAL", "CKMB", "CKMBINDEX", "TROPONINI" in the last 168 hours. BNP (last 3 results) No results for input(s): "PROBNP" in the last 8760 hours. HbA1C: No results for input(s): "HGBA1C" in the last 72 hours. CBG: No results for input(s): "GLUCAP" in the last 168 hours. Lipid Profile: No results for input(s): "CHOL", "HDL", "LDLCALC", "TRIG", "CHOLHDL", "LDLDIRECT" in the last 72 hours. Thyroid  Function Tests: No results for input(s): "TSH", "T4TOTAL", "FREET4", "T3FREE", "THYROIDAB" in the last 72 hours. Anemia Panel: No results for input(s): "VITAMINB12", "FOLATE", "FERRITIN", "TIBC", "IRON", "RETICCTPCT"  in the last 72 hours. Most Recent Urinalysis On File:     Component Value Date/Time   COLORURINE YELLOW (A) 09/30/2023 0557   APPEARANCEUR CLOUDY (A) 09/30/2023 0557   APPEARANCEUR Hazy (A) 08/12/2023 1141    LABSPEC 1.015 09/30/2023 0557   PHURINE 5.0 09/30/2023 0557   GLUCOSEU NEGATIVE 09/30/2023 0557   HGBUR SMALL (A) 09/30/2023 0557   BILIRUBINUR NEGATIVE 09/30/2023 0557   BILIRUBINUR Negative 08/12/2023 1141   KETONESUR NEGATIVE 09/30/2023 0557   PROTEINUR 30 (A) 09/30/2023 0557   UROBILINOGEN 1.0 06/30/2019 0823   NITRITE NEGATIVE 09/30/2023 0557   LEUKOCYTESUR LARGE (A) 09/30/2023 0557   Sepsis Labs: @LABRCNTIP (procalcitonin:4,lacticidven:4) Microbiology: Recent Results (from the past 240 hours)  Resp panel by RT-PCR (RSV, Flu A&B, Covid) Anterior Nasal Swab     Status: None   Collection Time: 09/30/23  5:06 AM   Specimen: Anterior Nasal Swab  Result Value Ref Range Status   SARS Coronavirus 2 by RT PCR NEGATIVE NEGATIVE Final    Comment: (NOTE) SARS-CoV-2 target nucleic acids are NOT DETECTED.  The SARS-CoV-2 RNA is generally detectable in upper respiratory specimens during the acute phase of infection. The lowest concentration of SARS-CoV-2 viral copies this assay can detect is 138 copies/mL. A negative result does not preclude SARS-Cov-2 infection and should not be used as the sole basis for treatment or other patient management decisions. A negative result may occur with  improper specimen collection/handling, submission of specimen other than nasopharyngeal swab, presence of viral mutation(s) within the areas targeted by this assay, and inadequate number of viral copies(<138 copies/mL). A negative result must be combined with clinical observations, patient history, and epidemiological information. The expected result is Negative.  Fact Sheet for Patients:  BloggerCourse.com  Fact Sheet for Healthcare Providers:  SeriousBroker.it  This test is no t yet approved or cleared by the [place]  FDA and  has been authorized for detection and/or diagnosis of SARS-CoV-2 by FDA under an Emergency Use Authorization (EUA).  This EUA will remain  in effect (meaning this test can be used) for the duration of the COVID-19 declaration under Section 564(b)(1) of the Act, 21 U.S.C.section 360bbb-3(b)(1), unless the authorization is terminated  or revoked sooner.       Influenza A by PCR NEGATIVE NEGATIVE Final   Influenza B by PCR NEGATIVE NEGATIVE Final    Comment: (NOTE) The Xpert Xpress SARS-CoV-2/FLU/RSV plus assay is intended as an aid in the diagnosis of influenza from Nasopharyngeal swab specimens and should not be used as a sole basis for treatment. Nasal washings and aspirates are unacceptable for Xpert Xpress SARS-CoV-2/FLU/RSV testing.  Fact Sheet for Patients: BloggerCourse.com  Fact Sheet for Healthcare Providers: SeriousBroker.it  This test is not yet approved or cleared by the [place]  FDA and has been authorized for detection and/or diagnosis of SARS-CoV-2 by FDA under an Emergency Use Authorization (EUA). This EUA will remain in effect (meaning this test can be used) for the duration of the COVID-19 declaration under Section 564(b)(1) of the Act, 21 U.S.C. section 360bbb-3(b)(1), unless the authorization is terminated or revoked.     Resp Syncytial Virus by PCR NEGATIVE NEGATIVE Final    Comment: (NOTE) Fact Sheet for Patients: BloggerCourse.com  Fact Sheet for Healthcare Providers: SeriousBroker.it  This test is not yet approved or cleared by the [place]  FDA and has been authorized for detection and/or diagnosis of SARS-CoV-2 by FDA under an Emergency Use Authorization (EUA). This EUA will remain in effect (meaning this test can  be used) for the duration of the COVID-19 declaration under Section 564(b)(1) of the Act, 21 U.S.C. section 360bbb-3(b)(1), unless the authorization is terminated or revoked.  Performed at [facility], [address]  [place], [place] [address]   Blood Culture (routine x 2)     Status: Abnormal   Collection Time: 09/30/23  5:06 AM   Specimen: Left Antecubital; Blood  Result Value Ref Range Status   Specimen Description   Final    LEFT ANTECUBITAL Performed at [facility], [address] [place], [place] [address]    Special Requests   Final    BOTTLES DRAWN AEROBIC AND ANAEROBIC Blood Culture results may not be optimal due to an inadequate volume of blood received in culture bottles Performed at [facility], [address] [place], [place] [address]    Culture  Setup Time   Final    GRAM POSITIVE COCCI AEROBIC BOTTLE ONLY CRITICAL RESULT CALLED TO, READ BACK BY AND VERIFIED WITH:  NATHAN BELUE AT 0401 10/02/23 JG Performed at [facility], [address] [place], [place] [address]    Culture METHICILLIN RESISTANT STAPHYLOCOCCUS AUREUS (A)  Final   Report Status 10/04/2023 FINAL  Final   Organism ID, Bacteria METHICILLIN RESISTANT STAPHYLOCOCCUS AUREUS  Final      Susceptibility   Methicillin resistant staphylococcus aureus - MIC*    CIPROFLOXACIN  >=8 RESISTANT Resistant     ERYTHROMYCIN >=8 RESISTANT Resistant     GENTAMICIN <=0.5 SENSITIVE Sensitive     OXACILLIN >=4 RESISTANT Resistant     TETRACYCLINE <=1 SENSITIVE Sensitive     VANCOMYCIN  1 SENSITIVE Sensitive     TRIMETH /SULFA  <=10 SENSITIVE Sensitive     CLINDAMYCIN <=0.25 SENSITIVE Sensitive     RIFAMPIN <=0.5 SENSITIVE Sensitive     Inducible Clindamycin NEGATIVE Sensitive     LINEZOLID  2 SENSITIVE Sensitive     * METHICILLIN RESISTANT STAPHYLOCOCCUS AUREUS  Blood Culture (routine x 2)     Status: None   Collection Time: 09/30/23  5:06 AM   Specimen: BLOOD  Result Value Ref Range Status   Specimen Description BLOOD RIGTH [facility]  Final   Special Requests   Final    BOTTLES DRAWN AEROBIC AND ANAEROBIC Blood Culture adequate volume   Culture   Final    NO GROWTH 5 DAYS Performed at [facility], [address] [place], [place] [address]    Report Status 10/05/2023 FINAL  Final  Blood Culture ID Panel (Reflexed)     Status: Abnormal   Collection Time: 09/30/23  5:06 AM  Result Value Ref Range Status   Enterococcus faecalis NOT DETECTED NOT DETECTED Final   Enterococcus Faecium NOT DETECTED NOT DETECTED Final   Listeria monocytogenes NOT DETECTED NOT DETECTED Final   Staphylococcus species DETECTED (A) NOT DETECTED Final    Comment: CRITICAL RESULT CALLED TO, READ BACK BY AND VERIFIED WITH:  NATHAN BELUE AT 0401 10/02/23 JG    Staphylococcus aureus (BCID) DETECTED (A) NOT DETECTED Final    Comment: Methicillin (oxacillin)-resistant Staphylococcus aureus (MRSA). MRSA is predictably resistant to beta-lactam antibiotics (except ceftaroline). Preferred therapy is vancomycin  unless clinically contraindicated. Patient requires contact precautions if  hospitalized. CRITICAL RESULT CALLED TO, READ BACK BY AND VERIFIED WITH:  NATHAN BELUE AT 0401 10/02/23 JG    Staphylococcus epidermidis NOT DETECTED NOT DETECTED Final   Staphylococcus lugdunensis NOT DETECTED NOT DETECTED Final   Streptococcus species NOT DETECTED NOT DETECTED Final   Streptococcus  agalactiae NOT DETECTED NOT DETECTED Final   Streptococcus pneumoniae NOT DETECTED NOT DETECTED Final   Streptococcus pyogenes NOT DETECTED NOT DETECTED Final   A.calcoaceticus-baumannii NOT DETECTED NOT DETECTED Final   Bacteroides fragilis NOT DETECTED NOT DETECTED Final   Enterobacterales NOT DETECTED NOT DETECTED Final   Enterobacter cloacae complex NOT DETECTED NOT DETECTED Final   Escherichia coli NOT DETECTED NOT DETECTED Final   Klebsiella aerogenes NOT DETECTED NOT DETECTED Final   Klebsiella oxytoca NOT DETECTED NOT DETECTED Final   Klebsiella pneumoniae NOT DETECTED NOT DETECTED Final   Proteus species NOT DETECTED NOT DETECTED Final   Salmonella species NOT DETECTED NOT DETECTED Final   Serratia marcescens NOT DETECTED NOT DETECTED  Final   Haemophilus influenzae NOT DETECTED NOT DETECTED Final   Neisseria meningitidis NOT DETECTED NOT DETECTED Final   Pseudomonas aeruginosa NOT DETECTED NOT DETECTED Final   Stenotrophomonas maltophilia NOT DETECTED NOT DETECTED Final   Candida albicans NOT DETECTED NOT DETECTED Final   Candida auris NOT DETECTED NOT DETECTED Final   Candida glabrata NOT DETECTED NOT DETECTED Final   Candida krusei NOT DETECTED NOT DETECTED Final   Candida parapsilosis NOT DETECTED NOT DETECTED Final   Candida tropicalis NOT DETECTED NOT DETECTED Final   Cryptococcus neoformans/gattii NOT DETECTED NOT DETECTED Final   Meth resistant mecA/C and MREJ DETECTED (A) NOT DETECTED Final    Comment: CRITICAL RESULT CALLED TO, READ BACK BY AND VERIFIED WITHNoe Bath AT 0401 10/02/23 JG Performed at [facility], [address], [place], [place] [address]   Urine Culture     Status: Abnormal   Collection Time: 09/30/23  5:57 AM   Specimen: Urine, Suprapubic  Result Value Ref Range Status   Specimen Description   Final    URINE, SUPRAPUBIC Performed at [facility], [address], [place], [place] [address]    Special Requests   Final    NONE Reflexed from [id number] Performed at [facility], [address] [place], [place] [address]    Culture (A)  Final    >=100,000 COLONIES/mL ESCHERICHIA COLI Confirmed Extended Spectrum Beta-Lactamase Producer (ESBL).  In bloodstream infections from ESBL organisms, carbapenems are preferred over piperacillin/tazobactam. They are shown to have a lower risk of mortality. >=100,000 COLONIES/mL ENTEROCOCCUS FAECALIS    Report Status 10/02/2023 FINAL  Final   Organism ID, Bacteria ESCHERICHIA COLI (A)  Final   Organism ID, Bacteria ENTEROCOCCUS FAECALIS (A)  Final      Susceptibility   Escherichia coli - MIC*    AMPICILLIN  >=32 RESISTANT Resistant     CEFAZOLIN >=64 RESISTANT Resistant     CEFEPIME  8 INTERMEDIATE Intermediate      CEFTRIAXONE  >=64 RESISTANT Resistant     CIPROFLOXACIN  >=4 RESISTANT Resistant     GENTAMICIN <=1 SENSITIVE Sensitive     IMIPENEM 0.5 SENSITIVE Sensitive     NITROFURANTOIN <=16 SENSITIVE Sensitive     TRIMETH /SULFA  >=320 RESISTANT Resistant     AMPICILLIN /SULBACTAM 4 SENSITIVE Sensitive     PIP/TAZO <=4 SENSITIVE Sensitive ug/mL    * >=100,000 COLONIES/mL ESCHERICHIA COLI   Enterococcus faecalis - MIC*    AMPICILLIN  <=2 SENSITIVE Sensitive     NITROFURANTOIN <=16 SENSITIVE Sensitive     VANCOMYCIN  1 SENSITIVE Sensitive     * >=100,000 COLONIES/mL ENTEROCOCCUS FAECALIS  MRSA Next Gen by PCR, Nasal     Status: Abnormal   Collection Time: 10/01/23 10:05 PM   Specimen: Nasal Mucosa; Nasal Swab  Result Value  Ref Range Status   MRSA by PCR Next Gen DETECTED (A) NOT DETECTED Final    Comment: RESULT CALLED TO, READ BACK BY AND VERIFIED WITH:  ERICKA KLARAS AT 1610 10/02/23 JG (NOTE) The GeneXpert MRSA Assay (FDA approved for NASAL specimens only), is one component of a comprehensive MRSA colonization surveillance program. It is not intended to diagnose MRSA infection nor to guide or monitor treatment for MRSA infections. Test performance is not FDA approved in patients less than 91 years old. Performed at [facility], [address] [place], [place] [address]   Culture, blood (Routine X 2) w Reflex to ID Panel     Status: None (Preliminary result)   Collection Time: 10/03/23 11:02 AM   Specimen: BLOOD  Result Value Ref Range Status   Specimen Description BLOOD RIGHT ANTECUBITAL  Final   Special Requests   Final    BOTTLES DRAWN AEROBIC AND ANAEROBIC Blood Culture adequate volume   Culture   Final    NO GROWTH 2 DAYS Performed at [facility], [address] [place], [place] [address]    Report Status PENDING  Incomplete  Culture, blood (Routine X 2) w Reflex to ID Panel     Status: None (Preliminary result)   Collection Time: 10/03/23 11:10 AM   Specimen:  BLOOD  Result Value Ref Range Status   Specimen Description BLOOD BLOOD RIGHT FOREARM  Final   Special Requests   Final    BOTTLES DRAWN AEROBIC AND ANAEROBIC Blood Culture adequate volume   Culture   Final    NO GROWTH 2 DAYS Performed at [facility], [address], [place], [place] [address]    Report Status PENDING  Incomplete      Radiology Studies last 3 days: CT CHEST WO CONTRAST Result Date: 10/01/2023 CLINICAL DATA:  Weakness, fever, abnormal x-ray EXAM: CT CHEST WITHOUT CONTRAST TECHNIQUE: Multidetector CT imaging of the chest was performed following the standard protocol without IV contrast. RADIATION DOSE REDUCTION: This exam was performed according to the departmental dose-optimization program which includes automated exposure control, adjustment of the mA and/or kV according to patient size and/or use of iterative reconstruction technique. COMPARISON:  03/04/2022, 08/08/2023, 09/30/2023 FINDINGS: Cardiovascular: Unenhanced imaging of the heart is unremarkable, without significant pericardial effusion. 4.5 cm ascending thoracic aortic aneurysm. Atherosclerosis of the aorta and coronary vasculature. Assessment of the vascular lumen cannot be performed without intravenous contrast. Mediastinum/Nodes: No enlarged mediastinal or axillary lymph nodes. Thyroid  gland, trachea, and esophagus demonstrate no significant findings. Moderate hiatal hernia. Lungs/Pleura: There is a thick walled right upper lobe cavity measuring 9.2 x 7.2 x 7.5 cm, with gas fluid level. There is surrounding dense airspace disease within the right upper lobe as well as within the right middle lobe. Findings are most consistent with cavitary pneumonia, new since the 08/08/2023 CT. There are small free-flowing bilateral pleural effusions, right greater than left. Dependent consolidation within the lower lobes most consistent with atelectasis. No pneumothorax. Central airways are patent. Upper Abdomen: Upper  abdominal ascites. Musculoskeletal: No acute or destructive bony abnormalities. Reconstructed images demonstrate no additional findings. IMPRESSION: 1. Large thick walled right upper lobe cavity with gas fluid level, with dense airspace disease involving the right upper and right middle lobes, most consistent with cavitating pneumonia. This has developed since the abdominal CT 08/08/2023. 2. Small free-flowing bilateral pleural effusions. 3. Moderate hiatal hernia. 4. 4.5 cm ascending thoracic aortic aneurysm. Ascending thoracic aortic aneurysm. Recommend semi-annual imaging followup by CTA or  MRA and referral to cardiothoracic surgery if not already obtained. This recommendation follows 2010 ACCF/AHA/AATS/ACR/ASA/SCA/SCAI/SIR/STS/SVM Guidelines for the Diagnosis and Management of Patients With Thoracic Aortic Disease. Circulation. 2010; 121: I696-E952. Aortic aneurysm NOS (ICD10-I71.9) 5.  Aortic Atherosclerosis (ICD10-I70.0). 6. Upper abdominal ascites. Electronically Signed   By: Bobbye Burrow M.D.   On: 10/01/2023 20:12       Time spent: 50 min     Marabella Popiel, DO [facility] 10/05/2023, 1:04 PM    Dictation software may have been used to generate the above note. Typos may occur and escape review in typed/dictated notes. Please contact Dr Authur Leghorn directly for clarity if needed.  Staff may message me via secure chat in Epic  but this may not receive an immediate response,  please page me for urgent matters!  If 7PM-7AM, please contact night coverage [email or web address]

## 2023-10-06 ENCOUNTER — Inpatient Hospital Stay

## 2023-10-06 DIAGNOSIS — J90 Pleural effusion, not elsewhere classified: Secondary | ICD-10-CM

## 2023-10-06 DIAGNOSIS — A4902 Methicillin resistant Staphylococcus aureus infection, unspecified site: Secondary | ICD-10-CM

## 2023-10-06 DIAGNOSIS — B9562 Methicillin resistant Staphylococcus aureus infection as the cause of diseases classified elsewhere: Secondary | ICD-10-CM | POA: Diagnosis not present

## 2023-10-06 DIAGNOSIS — G9341 Metabolic encephalopathy: Secondary | ICD-10-CM | POA: Diagnosis not present

## 2023-10-06 DIAGNOSIS — A419 Sepsis, unspecified organism: Secondary | ICD-10-CM | POA: Diagnosis not present

## 2023-10-06 DIAGNOSIS — J189 Pneumonia, unspecified organism: Secondary | ICD-10-CM

## 2023-10-06 DIAGNOSIS — R6520 Severe sepsis without septic shock: Secondary | ICD-10-CM | POA: Diagnosis not present

## 2023-10-06 DIAGNOSIS — J984 Other disorders of lung: Secondary | ICD-10-CM

## 2023-10-06 DIAGNOSIS — R7881 Bacteremia: Secondary | ICD-10-CM | POA: Diagnosis not present

## 2023-10-06 DIAGNOSIS — A403 Sepsis due to Streptococcus pneumoniae: Secondary | ICD-10-CM | POA: Diagnosis not present

## 2023-10-06 LAB — BASIC METABOLIC PANEL WITH GFR
Anion gap: 9 (ref 5–15)
BUN: 54 mg/dL — ABNORMAL HIGH (ref 8–23)
CO2: 22 mmol/L (ref 22–32)
Calcium: 8.1 mg/dL — ABNORMAL LOW (ref 8.9–10.3)
Chloride: 106 mmol/L (ref 98–111)
Creatinine, Ser: 1.54 mg/dL — ABNORMAL HIGH (ref 0.61–1.24)
GFR, Estimated: 45 mL/min — ABNORMAL LOW (ref >60)
Glucose, Bld: 113 mg/dL — ABNORMAL HIGH (ref 70–99)
Potassium: 4.2 mmol/L (ref 3.5–5.1)
Sodium: 137 mmol/L (ref 135–145)

## 2023-10-06 LAB — CBC
HCT: 27.6 % — ABNORMAL LOW (ref 39.0–52.0)
Hemoglobin: 9.2 g/dL — ABNORMAL LOW (ref 13.0–17.0)
MCH: 32.9 pg (ref 26.0–34.0)
MCHC: 33.3 g/dL (ref 30.0–36.0)
MCV: 98.6 fL (ref 80.0–100.0)
Platelets: 338 10*3/uL (ref 150–400)
RBC: 2.8 MIL/uL — ABNORMAL LOW (ref 4.22–5.81)
RDW: 14.7 % (ref 11.5–15.5)
WBC: 19.8 10*3/uL — ABNORMAL HIGH (ref 4.0–10.5)
nRBC: 0 % (ref 0.0–0.2)

## 2023-10-06 LAB — VANCOMYCIN, RANDOM: Vancomycin Rm: 12 ug/mL

## 2023-10-06 LAB — APTT: aPTT: 40 s — ABNORMAL HIGH (ref 24–36)

## 2023-10-06 LAB — EXPECTORATED SPUTUM ASSESSMENT W GRAM STAIN, RFLX TO RESP C

## 2023-10-06 LAB — HEPATIC FUNCTION PANEL
ALT: 152 U/L — ABNORMAL HIGH (ref 0–44)
AST: 130 U/L — ABNORMAL HIGH (ref 15–41)
Albumin: 1.6 g/dL — ABNORMAL LOW (ref 3.5–5.0)
Alkaline Phosphatase: 223 U/L — ABNORMAL HIGH (ref 38–126)
Bilirubin, Direct: 0.2 mg/dL (ref 0.0–0.2)
Indirect Bilirubin: 0.6 mg/dL (ref 0.3–0.9)
Total Bilirubin: 0.8 mg/dL (ref 0.0–1.2)
Total Protein: 4.7 g/dL — ABNORMAL LOW (ref 6.5–8.1)

## 2023-10-06 LAB — GAMMA GT: GGT: 63 U/L — ABNORMAL HIGH (ref 7–50)

## 2023-10-06 LAB — CK: Total CK: 13 U/L — ABNORMAL LOW (ref 49–397)

## 2023-10-06 LAB — PROTIME-INR
INR: 1.2 (ref 0.8–1.2)
Prothrombin Time: 15.3 s — ABNORMAL HIGH (ref 11.4–15.2)

## 2023-10-06 MED ORDER — HEPARIN (PORCINE) 25000 UT/250ML-% IV SOLN
1200.0000 [IU]/h | INTRAVENOUS | Status: DC
  Administered 2023-10-06: 900 [IU]/h via INTRAVENOUS
  Filled 2023-10-06: qty 250

## 2023-10-06 MED ORDER — IOHEXOL 300 MG/ML  SOLN
80.0000 mL | Freq: Once | INTRAMUSCULAR | Status: AC | PRN
  Administered 2023-10-06: 80 mL via INTRAVENOUS

## 2023-10-06 MED ORDER — HEPARIN BOLUS VIA INFUSION
3400.0000 [IU] | Freq: Once | INTRAVENOUS | Status: AC
  Administered 2023-10-06: 3400 [IU] via INTRAVENOUS
  Filled 2023-10-06: qty 3400

## 2023-10-06 MED ORDER — SODIUM CHLORIDE 0.9 % IV SOLN
1.0000 g | Freq: Two times a day (BID) | INTRAVENOUS | Status: DC
  Administered 2023-10-06 – 2023-10-10 (×8): 1 g via INTRAVENOUS
  Filled 2023-10-06 (×9): qty 20

## 2023-10-06 MED ORDER — SODIUM CHLORIDE 0.9 % IV SOLN: INTRAVENOUS | Status: DC

## 2023-10-06 MED ORDER — IOHEXOL 9 MG/ML PO SOLN
500.0000 mL | ORAL | Status: AC
Start: 2023-10-06 — End: 2023-10-06
  Administered 2023-10-06 (×2): 500 mL via ORAL

## 2023-10-06 MED ORDER — SODIUM CHLORIDE 3 % IN NEBU
4.0000 mL | INHALATION_SOLUTION | RESPIRATORY_TRACT | Status: AC | PRN
  Filled 2023-10-06: qty 4

## 2023-10-06 MED ORDER — ALBUTEROL SULFATE (2.5 MG/3ML) 0.083% IN NEBU: 2.5000 mg | INHALATION_SOLUTION | RESPIRATORY_TRACT | Status: DC | PRN

## 2023-10-06 MED ORDER — VANCOMYCIN HCL 750 MG/150ML IV SOLN
750.0000 mg | INTRAVENOUS | Status: DC
  Administered 2023-10-06 – 2023-10-08 (×3): 750 mg via INTRAVENOUS
  Filled 2023-10-06 (×3): qty 150

## 2023-10-06 NOTE — Consult Note (Signed)
 [facility] CARDIOLOGY CONSULT NOTE       Patient ID: Zachary [name] MRN: [id number] DOB/AGE: [birth year] 83 y.o.  Admit date: 09/30/2023 Referring Physician Dr. Melodi Sprung Primary Physician [name], MD  Primary Cardiologist Dr. Braxton Calico Reason for Consultation MRSA bacteremia, need for TEE  HPI: Zachary [name] is a 83 y.o. male  with a past medical history of hypertension, hyperlipidemia, urinary retention s/p suprapubic catheter placement who presented to the ED on 09/30/2023 for weakness. Found to be septic, blood cx positive for MRSA. Cardiology was consulted for further evaluation.   Patient initially presented to the ED with complaints of weakn, has had recent weight loss of roughly 40 pounds. Workup in the ED notable for creatinine 2.41, potassium 4.2, sodium 129, albumin 2.4, hemoglobin 9.2, WBC 14.8.  Lactic acid normal at 1.4.  Procalcitonin elevated at 6.79.  Troponins mildly elevated and flat trending at 229 > 204 > 130.  Chest x-ray in the ED with chronic hyperinflation and new multifocal right lung opacity.  Noncontrast chest CT 4/30 with large thick-walled right upper lobe cavitary lesion, consistent with cavitating pneumonia.  He was started on IV antibiotics and IV fluids in ED.  Initial blood cultures came back positive for MRSA.  Antibiotics have been continued and on repeat CT chest today with contrast he was noted to have bilateral pulmonary emboli.  Given his bacteremia we were asked to see the patient for TEE to rule out endocarditis.  At the time of my evaluation this afternoon the patient is resting comfortably in hospital bed with friend present at bedside.  We discussed his symptoms in further detail.  He reports that today he is feeling relatively well overall.  Denies any chest pain or shortness of breath.  Remains on room air.  No significant cough.   Review of systems complete and found to be negative unless listed above    Past  Medical History:  Diagnosis Date   B12 deficiency anemia 08/03/2019   Hypercholesterolemia    Hypertension    Vertigo    last episode approx 09/2018    Past Surgical History:  Procedure Laterality Date   APPENDECTOMY     CATARACT EXTRACTION Bilateral    COLONOSCOPY WITH PROPOFOL  N/A 12/24/2018   Procedure: COLONOSCOPY WITH PROPOFOL ;  Surgeon: Marnee Sink, MD;  Location: [facility];  Service: Endoscopy;  Laterality: N/A;   COLONOSCOPY WITH PROPOFOL  N/A 12/13/2019   Procedure: COLONOSCOPY WITH BIOPSY ;  Surgeon: Marnee Sink, MD;  Location: [facility];  Service: Endoscopy;  Laterality: N/A;  priority 3   IR CYSTOSTOMY TUBE PLACEMENT/BLADDER ASPIRATION  08/26/2023   PLEURAL SCARIFICATION  05/2015   POLYPECTOMY N/A 12/13/2019   Procedure: POLYPECTOMY;  Surgeon: Marnee Sink, MD;  Location: [facility];  Service: Endoscopy;  Laterality: N/A;    Medications Prior to Admission  Medication Sig Dispense Refill Last Dose/Taking   acetaminophen  (TYLENOL ) 325 MG tablet Take 2 tablets (650 mg total) by mouth every 6 (six) hours as needed for mild pain (pain score 1-3), headache or fever (or Fever >/= 101).   Past Week   atorvastatin  (LIPITOR) 10 MG tablet Take 10 mg by mouth daily.   09/29/2023   calcium  carbonate (OS-CAL - DOSED IN MG OF ELEMENTAL CALCIUM ) 1250 (500 Ca) MG tablet Take 1 tablet (1,250 mg total) by mouth 2 (two) times daily with a meal. 30 tablet 0 09/29/2023   Multiple Vitamin (MULTIVITAMIN WITH MINERALS) TABS tablet Take 1 tablet by mouth  daily.   09/29/2023   triamterene -hydrochlorothiazide (DYAZIDE) 37.5-25 MG capsule Take 1 capsule by mouth daily.   09/29/2023   [Paused] aspirin  EC 81 MG tablet Take 81 mg by mouth at bedtime. Pt states he has resumed as of 09/26/23      Social History   Socioeconomic History   Marital status: Married    Spouse name: Not on file   Number of children: 0   Years of education: Not on file   Highest education level: 12th grade   Occupational History   Occupation: retired  Tobacco Use   Smoking status: Never   Smokeless tobacco: Never  Vaping Use   Vaping status: Never Used  Substance and Sexual Activity   Alcohol  use: No    Alcohol /week: 0.0 standard drinks of alcohol    Drug use: No   Sexual activity: Not on file  Other Topics Concern   Not on file  Social History Narrative   Lives with [name], wife.    Social Drivers of Corporate investment banker Strain: Low Risk  (09/25/2021)   Overall Financial Resource Strain (CARDIA)    Difficulty of Paying Living Expenses: Not hard at all  Food Insecurity: No Food Insecurity (09/30/2023)   Hunger Vital Sign    Worried About Running Out of Food in the Last Year: Never true    Ran Out of Food in the Last Year: Never true  Transportation Needs: No Transportation Needs (09/30/2023)   PRAPARE - Administrator, Civil Service (Medical): No    Lack of Transportation (Non-Medical): No  Physical Activity: Insufficiently Active (09/25/2021)   Exercise Vital Sign    Days of Exercise per Week: 2 days    Minutes of Exercise per Session: 60 min  Stress: No Stress Concern Present (11/24/2019)   [name]    Feeling of Stress : Not at all  Social Connections: Moderately Isolated (09/30/2023)   Social Connection and Isolation Panel [NHANES]    Frequency of Communication with Friends and Family: Twice a week    Frequency of Social Gatherings with Friends and Family: More than three times a week    Attends Religious Services: Never    Database administrator or Organizations: No    Attends Banker Meetings: Never    Marital Status: Married  Catering manager Violence: Not At Risk (09/30/2023)   Humiliation, Afraid, Rape, and Kick questionnaire    Fear of Current or Ex-Partner: No    Emotionally Abused: No    Physically Abused: No    Sexually Abused: No    Family History  Problem Relation  Age of Onset   Hypertension Mother    Hyperlipidemia Mother    Heart attack Father    Hypertension Father    CVA Father    ALS Brother    Prostate cancer Brother      Vitals:   10/06/23 0007 10/06/23 0458 10/06/23 0837 10/06/23 1233  BP: 93/60 90/66 104/77 117/65  Pulse: 73 (!) 57 74 77  Resp: 18 16 16 18   Temp: 98.4 F (36.9 C) 98.6 F (37 C) 97.9 F (36.6 C) 97.8 F (36.6 C)  TempSrc: Oral Oral Oral Oral  SpO2: 97% 97% 100% 99%  Weight:      Height:        PHYSICAL EXAM General: Chronically ill-appearing elderly male, cachectic, in no acute distress. HEENT: Normocephalic and atraumatic. Neck: No JVD.  Lungs: Normal respiratory  effort on room air.  Right-sided rhonchi Heart: HRRR. Normal S1 and S2 without gallops or murmurs.  Abdomen: Non-distended appearing.  Msk: Normal strength and tone for age. Extremities: Warm and well perfused. No clubbing, cyanosis.  No edema.  Neuro: Alert and oriented X 3. Psych: Answers questions appropriately.   Labs: Basic Metabolic Panel: Recent Labs    10/04/23 0432 10/05/23 0424 10/06/23 0434  NA 134*  --  137  K 3.9  --  4.2  CL 102  --  106  CO2 21*  --  22  GLUCOSE 121*  --  113*  BUN 59*  --  54*  CREATININE 1.70* 1.60* 1.54*  CALCIUM  8.1*  --  8.1*   Liver Function Tests: Recent Labs    10/04/23 0432 10/06/23 0434  AST 78* 130*  ALT 72* 152*  ALKPHOS 152* 223*  BILITOT 0.6 0.8  PROT 4.8* 4.7*  ALBUMIN 1.7* 1.6*   No results for input(s): "LIPASE", "AMYLASE" in the last 72 hours. CBC: Recent Labs    10/04/23 0432 10/06/23 0434  WBC 15.1* 19.8*  NEUTROABS 10.9*  --   HGB 9.7* 9.2*  HCT 29.3* 27.6*  MCV 97.3 98.6  PLT 281 338   Cardiac Enzymes: Recent Labs    10/06/23 1307  CKTOTAL 13*   BNP: No results for input(s): "BNP" in the last 72 hours. D-Dimer: No results for input(s): "DDIMER" in the last 72 hours. Hemoglobin A1C: No results for input(s): "HGBA1C" in the last 72 hours. Fasting  Lipid Panel: No results for input(s): "CHOL", "HDL", "LDLCALC", "TRIG", "CHOLHDL", "LDLDIRECT" in the last 72 hours. Thyroid  Function Tests: No results for input(s): "TSH", "T4TOTAL", "T3FREE", "THYROIDAB" in the last 72 hours.  Invalid input(s): "FREET3" Anemia Panel: No results for input(s): "VITAMINB12", "FOLATE", "FERRITIN", "TIBC", "IRON", "RETICCTPCT" in the last 72 hours.   Radiology: CT CHEST ABDOMEN PELVIS W CONTRAST Result Date: 10/06/2023 CLINICAL DATA:  Worsening leukocytosis and transaminitis despite antibiotic treatment. Unintentional weight loss. Known cavitary pneumonia. Clinical concern for possible abscess or malignancy. EXAM: CT CHEST, ABDOMEN, AND PELVIS WITH CONTRAST TECHNIQUE: Multidetector CT imaging of the chest, abdomen and pelvis was performed following the standard protocol during bolus administration of intravenous contrast. RADIATION DOSE REDUCTION: This exam was performed according to the departmental dose-optimization program which includes automated exposure control, adjustment of the mA and/or kV according to patient size and/or use of iterative reconstruction technique. CONTRAST:  80mL OMNIPAQUE  IOHEXOL  300 MG/ML  SOLN COMPARISON:  Chest CT dated 10/01/2023. Abdomen and pelvis CT dated 08/08/2023. FINDINGS: CT CHEST FINDINGS Cardiovascular: There is a moderate-sized pulmonary embolus in the distal left main pulmonary artery and extending into the proximal left upper lobe pulmonary artery without occlusion. A smaller embolus is demonstrated in a right lower lobe pulmonary artery branch. These are not sufficient to cause right heart strain. The previously demonstrated 4.5 cm ascending thoracic aorta aneurysm currently measures 4.3 cm. Enlarged central pulmonary arteries with a main pulmonary artery measuring 3.6 cm in diameter. Enlarged heart. Atheromatous calcifications, including the coronary arteries and aorta. Slight increase in size of a small pericardial effusion with a  maximum thickness of 8 mm. Mediastinum/Nodes: No enlarged mediastinal, hilar, or axillary lymph nodes. Thyroid  gland, trachea, and esophagus demonstrate no significant findings. Again demonstrated is a large hiatal hernia with an intrathoracic stomach. Lungs/Pleura: Increased size and confluence of the large cavitary lesion previously demonstrated in the right upper lobe with a moderately thick, irregular peripheral rind and adjacent airspace opacity. This continues  to contain some fluid, with mild improvement. This area currently measures 13.2 x 8.2 cm on image number 94/4, previously 8.6 x 6.5 cm in corresponding dimensions. Continued patchy airspace opacity in the adjacent portions of the right upper lobe. These areas have increased some in size and decreased some in density. The previously demonstrated right middle lobe nodular density has cavitated with 2 adjacent areas of cavitation and patchy opacity currently demonstrated in that area. Small to moderate-sized bilateral pleural effusions, increased on the right and without significant change on the left. Mild bilateral lower lobe atelectasis. Musculoskeletal: Mild thoracic spine degenerative changes. No evidence of bony metastatic disease. CT ABDOMEN PELVIS FINDINGS Hepatobiliary: No focal liver abnormality is seen. No gallstones, gallbladder wall thickening, or biliary dilatation. Pancreas: Unremarkable. No pancreatic ductal dilatation or surrounding inflammatory changes. Spleen: Normal in size without focal abnormality. Adrenals/Urinary Tract: Moderate dilatation of the left renal collecting system and ureter with mild progression mild dilatation of the right renal collecting system and ureter with mild improvement. Again demonstrated is asymmetrical anterior bladder wall thickening, thicker on the right, with improvement. A suprapubic bladder catheter is currently demonstrated. Stable fat density left adrenal myelolipoma and probable lipid poor left  adrenal adenoma. Unremarkable right adrenal gland. Stomach/Bowel: Extensive sigmoid and descending colon diverticulosis without evidence of diverticulitis. The appendix is not visualized. Again noted is the entire stomach within a large hiatal hernia. Unremarkable small bowel. Vascular/Lymphatic: Atheromatous arterial calcifications without aneurysm. No enlarged lymph nodes. Reproductive: Mildly enlarged and heterogeneous prostate gland with coarse calcifications. Other: No abdominal wall hernia or abnormality. No abdominopelvic ascites. Musculoskeletal: Moderate lumbar spine degenerative changes and mild scoliosis. IMPRESSION: 1. Bilateral pulmonary emboli, as described above. These are not sufficient to cause right heart strain. 2. Increased size and confluence of the large cavitary lesion previously demonstrated in the right upper lobe with a moderately thick, irregular peripheral rind and adjacent airspace opacity. This continues to contain some fluid, with mild improvement. This is most likely due to a cavitary pneumonia. A cavitary neoplasm is less likely since it was not present on the abdomen and pelvis CT obtained on 08/08/2023. 3. Continued patchy airspace opacity in the adjacent portions of the right upper lobe. These areas have increased some in size and decreased some in density, compatible with ongoing pneumonia. 4. The previously demonstrated right middle lobe nodular density has cavitated with 2 adjacent areas of cavitation and patchy opacity currently demonstrated in that area, also compatible with cavitary pneumonia. 5. Small to moderate-sized bilateral pleural effusions, increased on the right and without significant change on the left. 6. Mild bilateral lower lobe atelectasis. 7. Slight increase in size of a small pericardial effusion. 8. 4.3 cm ascending thoracic aorta aneurysm. Recommend annual imaging followup by CTA or MRA. This recommendation follows 2010  ACCF/AHA/AATS/ACR/ASA/SCA/SCAI/SIR/STS/SVM Guidelines for the Diagnosis and Management of Patients with Thoracic Aortic Disease. Circulation. 2010; 121: Z610-R604. Aortic aneurysm NOS (ICD10-I71.9) 9. Enlarged central pulmonary arteries, suggesting pulmonary arterial hypertension. 10. Cardiomegaly. 11. Large hiatal hernia with an intrathoracic stomach. 12. Moderate dilatation of the left renal collecting system and ureter with mild progression. Mild dilatation of the right renal collecting system and ureter with mild improvement. This remains compatible with bilateral ureteral obstruction at the level of the ureterovesical junction 13. Asymmetrical anterior bladder wall thickening, thicker on the right, with improvement. A suprapubic bladder catheter is currently demonstrated. 14. Stable left adrenal myelolipoma and probable lipid poor adenoma. 15. Extensive sigmoid and descending colon diverticulosis without evidence of diverticulitis.  16. Mildly enlarged and heterogeneous prostate gland. 17.  Calcific coronary artery and aortic atherosclerosis. Aortic Atherosclerosis (ICD10-I70.0). Critical Value/emergent results were called by telephone at the time of interpretation on 10/06/2023 at 2:14 pm to provider Melodi Sprung , who verbally acknowledged these results. Electronically Signed   By: Catherin Closs M.D.   On: 10/06/2023 14:32   CT CHEST WO CONTRAST Result Date: 10/01/2023 CLINICAL DATA:  Weakness, fever, abnormal x-ray EXAM: CT CHEST WITHOUT CONTRAST TECHNIQUE: Multidetector CT imaging of the chest was performed following the standard protocol without IV contrast. RADIATION DOSE REDUCTION: This exam was performed according to the departmental dose-optimization program which includes automated exposure control, adjustment of the mA and/or kV according to patient size and/or use of iterative reconstruction technique. COMPARISON:  03/04/2022, 08/08/2023, 09/30/2023 FINDINGS: Cardiovascular: Unenhanced imaging  of the heart is unremarkable, without significant pericardial effusion. 4.5 cm ascending thoracic aortic aneurysm. Atherosclerosis of the aorta and coronary vasculature. Assessment of the vascular lumen cannot be performed without intravenous contrast. Mediastinum/Nodes: No enlarged mediastinal or axillary lymph nodes. Thyroid  gland, trachea, and esophagus demonstrate no significant findings. Moderate hiatal hernia. Lungs/Pleura: There is a thick walled right upper lobe cavity measuring 9.2 x 7.2 x 7.5 cm, with gas fluid level. There is surrounding dense airspace disease within the right upper lobe as well as within the right middle lobe. Findings are most consistent with cavitary pneumonia, new since the 08/08/2023 CT. There are small free-flowing bilateral pleural effusions, right greater than left. Dependent consolidation within the lower lobes most consistent with atelectasis. No pneumothorax. Central airways are patent. Upper Abdomen: Upper abdominal ascites. Musculoskeletal: No acute or destructive bony abnormalities. Reconstructed images demonstrate no additional findings. IMPRESSION: 1. Large thick walled right upper lobe cavity with gas fluid level, with dense airspace disease involving the right upper and right middle lobes, most consistent with cavitating pneumonia. This has developed since the abdominal CT 08/08/2023. 2. Small free-flowing bilateral pleural effusions. 3. Moderate hiatal hernia. 4. 4.5 cm ascending thoracic aortic aneurysm. Ascending thoracic aortic aneurysm. Recommend semi-annual imaging followup by CTA or MRA and referral to cardiothoracic surgery if not already obtained. This recommendation follows 2010 ACCF/AHA/AATS/ACR/ASA/SCA/SCAI/SIR/STS/SVM Guidelines for the Diagnosis and Management of Patients With Thoracic Aortic Disease. Circulation. 2010; 121: [id number]. Aortic aneurysm NOS (ICD10-I71.9) 5.  Aortic Atherosclerosis (ICD10-I70.0). 6. Upper abdominal ascites. Electronically  Signed   By: Bobbye Burrow M.D.   On: 10/01/2023 20:12   DG Chest Port 1 View Result Date: 09/30/2023 CLINICAL DATA:  83 year old male with possible sepsis. EXAM: PORTABLE CHEST 1 VIEW COMPARISON:  Chest CT 03/04/2022 and earlier. FINDINGS: Portable AP supine views at 0600 hours. Chronic retrocardiac moderate to large gastric hiatal hernia. Stable cardiac size and mediastinal contours. Chronic pulmonary hyperinflation. Patchy and indistinct new multifocal right lung opacity in both the upper and lower lung. No superimposed pneumothorax, pulmonary edema, pleural effusion. No definite left lung involvement. No acute osseous abnormality identified. Nondilated gas-filled bowel in the upper abdomen. IMPRESSION: 1. Chronic pulmonary hyperinflation with indistinct new multifocal right lung opacity suspicious for developing multilobar bronchopneumonia/pneumonia in this setting. No pleural effusion. 2. Chronic moderate to large hiatal hernia. Electronically Signed   By: Marlise Simpers M.D.   On: 09/30/2023 06:25   CT Cervical Spine Wo Contrast Result Date: 09/30/2023 CLINICAL DATA:  83 year old male status post fall in garage ester day. Generalized weakness, decreased p.o. Hematuria. Shortness of breath. EXAM: CT CERVICAL SPINE WITHOUT CONTRAST TECHNIQUE: Multidetector CT imaging of the cervical spine was performed without  intravenous contrast. Multiplanar CT image reconstructions were also generated. RADIATION DOSE REDUCTION: This exam was performed according to the departmental dose-optimization program which includes automated exposure control, adjustment of the mA and/or kV according to patient size and/or use of iterative reconstruction technique. COMPARISON:  Head CT today. FINDINGS: Alignment: Maintained cervical lordosis. Cervicothoracic junction alignment is within normal limits. Bilateral posterior element alignment is within normal limits. Skull base and vertebrae: Bone mineralization is within normal limits for  age. Visualized skull base is intact. No atlanto-occipital dissociation. C1 and C2 appear intact and aligned. No acute osseous abnormality identified. Soft tissues and spinal canal: No prevertebral fluid or swelling. No visible canal hematoma. Bulky calcified carotid atherosclerosis in the neck, severe on the right (series 3, image 58). Disc levels: Generally mild for age cervical spine degeneration and capacious CT appearance of the cervical spinal canal at most levels. Upper chest: Visible upper thoracic levels appear intact. Clear lung apices with questionable emphysema. Trace intravenous gas at the thoracic inlet, likely IV access related. IMPRESSION: 1. No acute traumatic injury identified in the cervical spine. Mild for age cervical spine degeneration. 2. Calcified carotid atherosclerosis in the neck, Severe at the Right ICA. Electronically Signed   By: Marlise Simpers M.D.   On: 09/30/2023 05:57   CT HEAD WO CONTRAST ( ) Result Date: 09/30/2023 CLINICAL DATA:  83 year old male status post fall in garage ester day. Generalized weakness, decreased p.o. Hematuria. Shortness of breath. EXAM: CT HEAD WITHOUT CONTRAST TECHNIQUE: Contiguous axial images were obtained from the base of the skull through the vertex without intravenous contrast. RADIATION DOSE REDUCTION: This exam was performed according to the departmental dose-optimization program which includes automated exposure control, adjustment of the mA and/or kV according to patient size and/or use of iterative reconstruction technique. COMPARISON:  Brain MRI 04/12/2020.  Head CT 01/20/2017. FINDINGS: Brain: Cerebral volume stable and within normal limits for age. Choroid plexus cysts, normal variant. No midline shift, ventriculomegaly, mass effect, evidence of mass lesion, intracranial hemorrhage or evidence of cortically based acute infarction. Gray-white differentiation stable and within normal limits for age. Vascular: Advanced calcified atherosclerosis at  the skull base. No suspicious intracranial vascular hyperdensity. Skull: Stable, intact. Sinuses/Orbits: Visualized paranasal sinuses and mastoids are clear. Other: No acute orbit or scalp soft tissue injury identified. Stable postoperative changes to the globes. IMPRESSION: 1. No acute intracranial abnormality or acute traumatic injury identified. 2. Stable and negative for age noncontrast CT appearance of the brain. Electronically Signed   By: Marlise Simpers M.D.   On: 09/30/2023 05:55    ECHO ordered  TELEMETRY reviewed by me 10/06/2023: Not on telemetry  EKG reviewed by me: sinus rhythm PVCs PACs rate 92 bpm  Data reviewed by me 10/06/2023: last 24h vitals tele labs imaging I/O ED provider note, admission H&P  Principal Problem:   Sepsis (HCC) Active Problems:   Mixed hyperlipidemia   Essential hypertension   Acute kidney injury superimposed on chronic kidney disease (HCC)   Pneumonia   UTI (urinary tract infection)   Acute encephalopathy   CKD (chronic kidney disease) stage 4, GFR 15-29 ml/min (HCC)   NSTEMI (non-ST elevated myocardial infarction) (HCC)   Protein-calorie malnutrition, severe   MRSA bacteremia    ASSESSMENT AND PLAN:  Zachary [name] is a 83 y.o. male  with a past medical history of hypertension, hyperlipidemia, urinary retention s/p suprapubic catheter placement who presented to the ED on 09/30/2023 for weakness. Found to be septic, blood cx positive for MRSA. Cardiology  was consulted for further evaluation.   # MRSA Bacteremia # Cavitary pneumonia # Pulmonary embolus # Hypertension # Hyperlipidemia Patient presented with complaints of weakness, found to be septic and had positive blood cultures with MRSA.  Initial chest x-ray with right lung opacity concerning for pneumonia.  CT chest today revealed large cavitary lesion of right upper lobe as well as bilateral pulmonary emboli. -TTE ordered to evaluate RV strain. -TEE scheduled for tomorrow.  N.p.o. after  midnight. -Continue heparin  for PE.  -Further management of pulmonary emboli, pneumonia, bacteremia per primary team and infectious disease. -Home statin held given transaminitis.  Resume when liver function improves.   This patient's plan of care was discussed and created with Dr. Bob Burn and he is in agreement.  Signed: Hamp Levine, PA-C  10/06/2023, 2:59 PM [facility]

## 2023-10-06 NOTE — Plan of Care (Signed)

## 2023-10-06 NOTE — Consult Note (Signed)
 Pharmacy Antibiotic Note  Zachary [name] is a 83 y.o. male admitted on 09/30/2023 with MRSA bacteremia.  Patient has history of ESBL UTI from 08/2023. On 5/1 patient stated he wished to leave and peripheral IV was removed.  He was then talked into staying the night 5/1 and his antibiotic was change to oral linezolid .  On 5/2 patient was agreeable to placing IV and eventually PICC for long-term antibiotics to treat MRSA bacteremia.  CT chest with cavitary pneumonia. Pharmacy has been  consulted for vancomycin  dosing.   Today, 10/06/2023 Day # 7 antibiotics - currently on vancomycin  Renal: SCr 1.54 mg/dl with PMH of CKD Current SCr looks to be below baseline WBC 19.8 (increased) Afebrile 4/29 blood cx: 1/4 GPC, MRSA per BCID 4/29 urine cx: ESBL E coli and E faecalis (amp susc) 5/2 repeat blood cx: NGTD Previously on vancomycin  1gm IV q48h - last dose given 4/30 at 0949  Vancomycin  levels: 5/4 vancomycin  500mg  IV q24h - last dose given 5/4 at 14:47.   With 3rd dose of this regimen vancomycin  peak = 18 mcg/ml on 5/14 at 17:53 Vancomycin  random level 5/5 at 10:28 = 12 mcg/ml Calculated AUC = 348, Cmax 19 mcg/ml and Cmin 11 mcg/ml.  Half-life 28.3 hr  Plan: Based on levels,  adjust vancomycin  to 750mg  IV q24h AUC 522  (goal 400-600) Cmax = 28, Cmin 16 Monitor renal function closely May need to recheck level(s) prior to discharge Follow repeat blood cultures ID following  Height: 6\' 2"  (188 cm) Weight: 56.7 kg (125 lb) IBW/kg (Calculated) : 82.2  Temp (24hrs), Avg:98.3 F (36.8 C), Min:97.2 F (36.2 C), Max:99.7 F (37.6 C)  Recent Labs  Lab 09/30/23 0506 10/01/23 0429 10/04/23 0432 10/05/23 0424 10/05/23 1753 10/06/23 0434 10/06/23 1028  WBC 14.8* 8.1 15.1*  --   --  19.8*  --   CREATININE 2.41* 2.05* 1.70* 1.60*  --  1.54*  --   LATICACIDVEN 1.4  --   --   --   --   --   --   VANCOPEAK  --   --   --   --  18*  --   --   VANCORANDOM  --  9  --   --   --   --  12     Estimated Creatinine Clearance: 30.7 mL/min (A) (by C-G formula based on SCr of 1.54 mg/dL (H)).    No Known Allergies  Antimicrobials this admission: Linezolid  5/1 >> 5/2 vancomycin  4/29 >> 4/30, 5/2 >> Meropenem  4/29 >> 5/1 Cefepime  x 1 4/29 Flagyl  x 1 4/29   Thank you for allowing pharmacy to be a part of this patient's care.  Plez Belton, PharmD, BCPS, BCIDP Work Cell: [phone] 10/06/2023 11:24 AM

## 2023-10-06 NOTE — Progress Notes (Signed)
 Nutrition Follow-up  DOCUMENTATION CODES:   Underweight, Severe malnutrition in context of chronic illness  INTERVENTION:   -Continue regular diet -Continue MVI with minerals daily -Continue Ensure Enlive po TID, each supplement provides 350 kcal and 20 grams of protein  NUTRITION DIAGNOSIS:   Severe Malnutrition related to social / environmental circumstances as evidenced by percent weight loss, severe fat depletion, severe muscle depletion.  Ongoing  GOAL:   Patient will meet greater than or equal to 90% of their needs  Progressing   MONITOR:   PO intake, Supplement acceptance  REASON FOR ASSESSMENT:   Consult Assessment of nutrition requirement/status, Poor PO  ASSESSMENT:   Pt with a past medical history of urinary retention status post suprapubic catheter placement admitted for history of weakness.  Reviewed I/O's: -894 ml x 24 hours and -836 ml since admission  UOP: 1.1 L x 24 hours  Pt sleeping soundly at time of visit. RD did not disturb.   Pt remains on a regular diet. Noted meal completions improving; po 50-95%.   No new wt since admission.   Per MD notes, plan for SNF placement once medically stable.   Palliative care following for goals of care discussions.   Medications reviewed and include lovenox .   Labs reviewed: CBGS: 80.    Diet Order:   Diet Order             Diet regular Room service appropriate? Yes; Fluid consistency: Thin  Diet effective now                   EDUCATION NEEDS:   Education needs have been addressed  Skin:  Skin Assessment: Reviewed RN Assessment  Last BM:  10/04/23 (type 2)  Height:   Ht Readings from Last 1 Encounters:  09/30/23 6\' 2"  (1.88 m)    Weight:   Wt Readings from Last 1 Encounters:  09/30/23 56.7 kg    Ideal Body Weight:  86.4 kg  BMI:  Body mass index is 16.05 kg/m.  Estimated Nutritional Needs:   Kcal:  2050-2250  Protein:  115-130 grams  Fluid:  2.0-2.2 L    Herschel Lords, RD, LDN, CDCES Registered Dietitian III Certified Diabetes Care and Education Specialist If unable to reach this RD, please use "RD Inpatient" group chat on secure chat between hours of 8am-4 pm daily

## 2023-10-06 NOTE — Progress Notes (Signed)
 [facility] Liaison Note  10/06/2023  Zachary [name] [birth year] [id number]  Location: RN Hospital Liaison screened the patient remotely at [facility]. Insurance: [facility] HMO   Zachary [name] is a 83 y.o. male who is a Primary Care Patient of [name], MD [facility]). The patient was screened for  day readmission hospitalization with noted high risk score for unplanned readmission risk with 2 IP/2 ED in 6 months.  The patient was assessed for potential Care Management service needs for post hospital transition for care coordination. Review of patient's electronic medical record reveals patient was admitted for Sepsis. Pt followed heavily by oncology. [facility] with [facility]. No VBCI needs at this time.   Plan: [facility] Liaison will continue to follow progress and disposition to asess for post hospital community care coordination/management needs.  Referral request for community care coordination: pending disposition.   VBCI Care Management/Population Health does not replace or interfere with any arrangements made by the Inpatient Transition of Care team.   For questions contact:   Lilla Reichert, RN, BSN Hospital Liaison [place]   [facility], Population Health Office Hours MTWF  8:00 am-6:00 pm Direct Dial: [phone] mobile [email or web address]

## 2023-10-06 NOTE — Consult Note (Signed)
 Consult Note   HPI Mr. Becken is an 83 year old male patient with a past medical history of hypertension, urinary retention status post suprapubic cath on 08/26/2023 who presented to [facility] initially on 0/30 with lethargy, encephalopathy and signs of sepsis.  His suprapubic catheter was exchanged in the ED.  Urine culture grew ESBL E. coli for which she received 3 days of meropenem  that was stopped.  His CT chest showed a right upper lobe and right middle lobe cavitary lung lesion with mediastinal lymphadenopathy.  He was also found to have MRSA bacteremia for which he was started on vancomycin .  He reports about 4 weeks of weight loss and lethargy.  However he denies any respiratory symptoms including cough sputum production shortness of breath chest tightness chest pain or wheezing.  He had a CT chest in 2023 that showed a 1 cm left lower lobe groundglass nodule.  He underwent a CT abdomen pelvis in March 2025 that showed a 1 cm right middle lobe nodule that was new from prior.  The upper parts of the lungs were not captured.  His most recent CT chest shows an increase in size and confluence of the large cavitary lesion.  This was not present on his CT abdomen and pelvis in March.  Family history -denies any family history of pulmonary diseases.  Social history -never smoker.  Physical exam GEN no acute distress, frail HEENT supple neck, reactive pupils Lungs diminished over the right hemithorax CVS normal S1, normal S2, regular rate and rhythm Abdomen soft nontender nondistended positive bowel sound Extremities warm well-perfused no edema.  Labs and imaging were reviewed.  Assessment and Plan Mr. Baxendale is an 83 year old male patient with a past medical history of urinary retention status post suprapubic catheter on 08/26/2023 who presented to [facility] on 04/30 with lethargy.  He was found to have MRSA bacteremia and a cavitary lung lesion.  His repeat CT scan today compared to 5 days  ago shows worsening cavitary lung lesion.  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  Less likely to be malignancy however an underlying malignant process cannot be ruled out.  But I think right now OUR focus should be on treating this necrotizing pneumonia that is getting worse and now with uptrending white count.  He will need follow up CT chest in 4 weeks to determine need for biopsy.   I do not see a role for bronchoscopy at this point however if we cannot obtain an induced sputum and his underlying micro diagnosis is still in question we can obtain a bronchoscopy for BAL to try to isolate any additional bug.  I will defer to ID regarding dual coverage for MRSA necrotizing pneumonia.  Also recommend discussing with thoracic surgery for surgical intervention.  Finally, bedside ultrasound revealed very small right-sided pleural effusion that is simple but this will be serially followed and we will place a chest tube should that be needed.  I spent 80 minutes caring for this patient today, including preparing to see the patient, obtaining a medical history , reviewing a separately obtained history, performing a medically appropriate examination and/or evaluation, counseling and educating the patient/family/caregiver, documenting clinical information in the electronic health record, and independently interpreting results (not separately reported/billed) and communicating results to the patient/family/caregiver  Annitta Kindler, MD [place] [facility] 10/06/2023 4:49 PM

## 2023-10-06 NOTE — Progress Notes (Addendum)
 PROGRESS NOTE    Zachary [name]   UEA:[id number] DOB: 1941/04/07  DOA: 09/30/2023 Date of Service: 10/06/23 which is hospital day 6  PCP: [name], MD    Hospital course / significant events:   HPI: Zachary [name] is a 83 y.o. male with medical history significant of HTN, urinary retention s/p suprapubic catheter placement,  HLD. Presents to ED from home via EMS w/ weakness.   Of note, hospitalized last month 03/04-03/07/25 w/ substantial urinary retention and AKI, Foley placed, f/u urology and suprapubic cath was placed 08/26/23. Also notable, has been following w/ PCP last visit 09/24/23 concern for unintentional weight loss, SPEP --> pending. Has been following w/ hematology last visit 09/26/23 for anemia and lung nodule, flow cytometry --> neg, CT chest --> pending   04/29: admitted to hospitalist service w/ sepsis, pna, uti, encephalopathy. ICU consult but he did not need pressors 04/30: await cultures, CT chest to eval pna/other RML. Alert today. 05/01: CT demonstrating cavitary pneumonia.  1 blood culture positive, concern for MRSA.  Patient requesting to leave AMA, see progress note and IPAL note.  Will stay at least through tonight.  ID has seen patient, considering for p.o. antibiotics in case patient is wanting to go home tomorrow 05/02: pt has reconsidered for treatment, he states he "made a mistake" yesterday and is now open to continuing IV antibiotics and going ot rehab hopefully same facility as his wife will be going to post-op. Palliative to follow either way.  05/03-05/04: repeat BCx negative thus far, continue current IV abx, placement to follow after the weekend  05/05: liver enzymes continue to elevate as well as alk phos, WBC also worsening, pt reports more fatigued today but no other symptoms to explain findings, CT chest/abd/pelv ordered --> d/w radiologist, (+)PE nonocclusive and no apparent RV strain, worsening cavitary lung lesion w/u as below and  pulm consult, consult w/ cardio re: TEE vs TTE given bacteremia and now PE, started heparin  and dc lovenox     Consultants:  PCCM Infectious disease Palliative care   Procedures/Surgeries: none      ASSESSMENT & PLAN:   Sepsis POA Worsening leukocytosis likely d/t infection Treating underlying infections as below   Cavitary pneumonia RML Question source for MRSA bacteremia  Cavitary lesion appears worse on CT 10/06/23 per radiology,  Infectious disease following  Vancomycin   Pulmonary consult - appreciate ecs re needing CT surgery for culture/biopsy, if bronch would be useful, monitor? Quantiferon TB testing, Urine strep and legionella, sputum culture ordered   MRSA bacteremia Infectious Disease following  Vancomycin  Monitor repeat cultures --> neg thus far  Cardiology consult for TEE  ESBL UTI Suprapubic catheter d/t retention Continue vancomycin   Urology follow outpatient   Pulmonary Embolism dx 10/06/23 on CT chest  Start heparin  IV drip, dc lovenox  No evidence on CT for RV strain per radiology, will get Echo (TTE/TEE per cardiology)  Goals of care discussion Palliative care consult to facilitate GOC discussion and plan In my judgment, patient has capacity to voice his goals for quality of life Today, 10/06/23, pt states he wants us  to do whatever it takes to make him well, maintain DNR   Elevated transaminase AST, ALP, ALP Trending up  Bilirubin normal  Elevation is not so severe to give suspicion for ischemia, no symptoms to give suspicion for GB disease or biliary obstruction CT chest/abd/pelvis for rising WBC, eval for liver pathology --> per radiology, nothing to explain transaminitis  Hold hepatotoxic medications as able  CK level question rhabdo --> neg for rhabdo GGT confirm GI source elevated alk phos vs inflammatory Consider for IgG, ANA, ASMA question autoimmune hepatitis  Elevated troponin likely demand ischemis in setting of sepsis, less  likely NSTEMI (non-ST elevated myocardial infarction)  Suspect secondary demand ischemia d/t sepsis Poor renal clearance also confounding issue Deferred ASA in setting of hematuria, now on heparin  Monitor   CKD (chronic kidney disease) stage 4, GFR 15-29 ml/min  Creatinine 2.4 on presentation with GFR in the 20s Appears to be near baseline Monitor   Acute encephalopathy POA - resolved  Positive generalized lethargy in setting of active sepsis with recurrent multifocal pneumonia and UTI CT head within normal  Monitor   Urinary retention s/p SP cath  History of ESBL E coli infection based on culture 03/12 UA indicative of infection POA 08/2023 Urine culture showing ESBL E coli  Abx as above per ID Suprapubic catheter exchanged in the ER  Follow cultures Keep cath site clean/dry    Essential hypertension Hold BP regimen in setting hypotension assd with sepsis  Monitor     Mixed hyperlipidemia hold lipitor          underweight based on BMI: Body mass index is 16.05 kg/m.[name] Significantly low or high BMI is associated with higher medical risk.  Underweight - under 18  overweight - 25 to 29 obese - 30 or more Class 1 obesity: BMI of 30.0 to 34 Class 2 obesity: BMI of 35.0 to 39 Class 3 obesity: BMI of 40.0 to 49 Super Morbid Obesity: BMI 50-59 Super-super Morbid Obesity: BMI 60+ Healthy nutrition and physical activity advised as adjunct to other disease management and risk reduction treatments    DVT prophylaxis: lovenox   IV fluids: none at this time  Nutrition: per dietician Central lines / other devices: suprapubic cath  Code Status: DNR ACP documentation reviewed:  none on file in [facility]  [facility] needs: SNF rehab Medical barriers to dispo: sepsis/infection awiting ID recs and clinical improvement. Expected medical readiness for discharge possibly next few days, worsening as above today               Subjective / Brief ROS:  Patient has no concerns today  other than he is more tired today than before Denies pain, fever Reports appetite is ok    Family Communication: I offered call to family 10/06/23, patient states he does not want me to call anyone      Objective Findings:  Vitals:   10/06/23 0007 10/06/23 0458 10/06/23 0837 10/06/23 1233  BP: 93/60 90/66 104/77 117/65  Pulse: 73 (!) 57 74 77  Resp: 18 16 16 18   Temp: 98.4 F (36.9 C) 98.6 F (37 C) 97.9 F (36.6 C) 97.8 F (36.6 C)  TempSrc: Oral Oral Oral Oral  SpO2: 97% 97% 100% 99%  Weight:      Height:        Intake/Output Summary (Last 24 hours) at 10/06/2023 1453 Last data filed at 10/06/2023 0035 Gross per 24 hour  Intake 230.28 ml  Output 1125 ml  Net -894.72 ml   Filed Weights   09/30/23 0457  Weight: 56.7 kg    Examination:  Physical Exam Constitutional:      General: He is not in acute distress.    Appearance: He is not ill-appearing (frail, thin) or toxic-appearing.  Cardiovascular:     Rate and Rhythm: Normal rate and regular rhythm.  Pulmonary:     Effort: Pulmonary effort is normal.  Breath sounds: Normal breath sounds.  Abdominal:     General: Abdomen is flat. Bowel sounds are normal. There is no distension.     Palpations: Abdomen is soft.     Tenderness: There is no abdominal tenderness.  Musculoskeletal:     Right lower leg: No edema.     Left lower leg: No edema.  Skin:    General: Skin is warm and dry.     Comments: Appears normal around suprapubic cath site   Neurological:     General: No focal deficit present.     Mental Status: He is alert and oriented to person, place, and time. Mental status is at baseline.  Psychiatric:        Mood and Affect: Mood normal.        Behavior: Behavior normal.          Scheduled Medications:   Chlorhexidine  Gluconate Cloth  6 each Topical Q0600   feeding supplement  237 mL Oral TID BM   multivitamin with minerals  1 tablet Oral Daily    Continuous Infusions:  vancomycin         PRN Medications:  guaiFENesin-dextromethorphan, ondansetron  **OR** ondansetron  (ZOFRAN ) IV  Antimicrobials from admission:  Anti-infectives (From admission, onward)    Start     Dose/Rate Route Frequency Ordered Stop   10/06/23 1400  vancomycin  (VANCOREADY) IVPB 750 mg/150 mL        750 mg 150 mL/hr over 60 Minutes Intravenous Every 24 hours 10/06/23 1135     10/04/23 1400  vancomycin  (VANCOREADY) IVPB 500 mg/100 mL  Status:  Discontinued        500 mg 100 mL/hr over 60 Minutes Intravenous Every 24 hours 10/03/23 1624 10/06/23 1135   10/03/23 1800  vancomycin  (VANCOREADY) IVPB 500 mg/100 mL        500 mg 100 mL/hr over 60 Minutes Intravenous  Once 10/03/23 1624 10/03/23 1859   10/02/23 2200  linezolid  (ZYVOX ) tablet 600 mg  Status:  Discontinued        600 mg Oral Every 12 hours 10/02/23 1550 10/03/23 1501   10/01/23 0900  vancomycin  (VANCOCIN ) IVPB 1000 mg/200 mL premix  Status:  Discontinued        1,000 mg 200 mL/hr over 60 Minutes Intravenous Every 48 hours 10/01/23 0736 10/02/23 1550   10/01/23 0800  vancomycin  variable dose per unstable renal function (pharmacist dosing)  Status:  Discontinued         Does not apply See admin instructions 09/30/23 0747 10/01/23 0738   09/30/23 1000  meropenem  (MERREM ) 500 mg in sodium chloride  0.9 % 100 mL IVPB  Status:  Discontinued        500 mg 200 mL/hr over 30 Minutes Intravenous Every 12 hours 09/30/23 0728 10/02/23 1550   09/30/23 0500  ceFEPIme  (MAXIPIME ) 2 g in sodium chloride  0.9 % 100 mL IVPB        2 g 200 mL/hr over 30 Minutes Intravenous  Once 09/30/23 0458 09/30/23 0608   09/30/23 0500  metroNIDAZOLE  (FLAGYL ) IVPB 500 mg        500 mg 100 mL/hr over 60 Minutes Intravenous  Once 09/30/23 0458 09/30/23 0826   09/30/23 0500  vancomycin  (VANCOCIN ) IVPB 1000 mg/200 mL premix        1,000 mg 200 mL/hr over 60 Minutes Intravenous  Once 09/30/23 0458 09/30/23 1610           Data Reviewed:  I have personally reviewed  the following...  CBC:  Recent Labs  Lab 09/30/23 0506 10/01/23 0429 10/04/23 0432 10/06/23 0434  WBC 14.8* 8.1 15.1* 19.8*  NEUTROABS 12.8*  --  10.9*  --   HGB 9.2* 8.5* 9.7* 9.2*  HCT 27.4* 25.2* 29.3* 27.6*  MCV 99.6 98.1 97.3 98.6  PLT 215 184 281 338   Basic Metabolic Panel: Recent Labs  Lab 09/30/23 0506 10/01/23 0429 10/04/23 0432 10/05/23 0424 10/06/23 0434  NA 129* 132* 134*  --  137  K 4.2 3.9 3.9  --  4.2  CL 98 104 102  --  106  CO2 20* 18* 21*  --  22  GLUCOSE 135* 95 121*  --  113*  BUN 67* 59* 59*  --  54*  CREATININE 2.41* 2.05* 1.70* 1.60* 1.54*  CALCIUM  8.7* 8.2* 8.1*  --  8.1*  MG 2.1  --   --   --   --    GFR: Estimated Creatinine Clearance: 30.7 mL/min (A) (by C-G formula based on SCr of 1.54 mg/dL (H)). Liver Function Tests: Recent Labs  Lab 09/30/23 0506 10/01/23 0429 10/04/23 0432 10/06/23 0434  AST 21 26 78* 130*  ALT 20 22 72* 152*  ALKPHOS 76 80 152* 223*  BILITOT 1.1 0.7 0.6 0.8  PROT 5.8* 4.6* 4.8* 4.7*  ALBUMIN 2.4* 1.7* 1.7* 1.6*   No results for input(s): "LIPASE", "AMYLASE" in the last 168 hours. No results for input(s): "AMMONIA" in the last 168 hours. Coagulation Profile: Recent Labs  Lab 09/30/23 0506 09/30/23 0909  INR 1.2 2.3*   Cardiac Enzymes: Recent Labs  Lab 10/06/23 1307  CKTOTAL 13*   BNP (last 3 results) No results for input(s): "PROBNP" in the last 8760 hours. HbA1C: No results for input(s): "HGBA1C" in the last 72 hours. CBG: No results for input(s): "GLUCAP" in the last 168 hours. Lipid Profile: No results for input(s): "CHOL", "HDL", "LDLCALC", "TRIG", "CHOLHDL", "LDLDIRECT" in the last 72 hours. Thyroid  Function Tests: No results for input(s): "TSH", "T4TOTAL", "FREET4", "T3FREE", "THYROIDAB" in the last 72 hours. Anemia Panel: No results for input(s): "VITAMINB12", "FOLATE", "FERRITIN", "TIBC", "IRON", "RETICCTPCT" in the last 72 hours. Most Recent Urinalysis On File:     Component Value  Date/Time   COLORURINE YELLOW (A) 09/30/2023 0557   APPEARANCEUR CLOUDY (A) 09/30/2023 0557   APPEARANCEUR Hazy (A) 08/12/2023 1141   LABSPEC 1.015 09/30/2023 0557   PHURINE 5.0 09/30/2023 0557   GLUCOSEU NEGATIVE 09/30/2023 0557   HGBUR SMALL (A) 09/30/2023 0557   BILIRUBINUR NEGATIVE 09/30/2023 0557   BILIRUBINUR Negative 08/12/2023 1141   KETONESUR NEGATIVE 09/30/2023 0557   PROTEINUR 30 (A) 09/30/2023 0557   UROBILINOGEN 1.0 06/30/2019 0823   NITRITE NEGATIVE 09/30/2023 0557   LEUKOCYTESUR LARGE (A) 09/30/2023 0557   Sepsis Labs: @LABRCNTIP (procalcitonin:4,lacticidven:4) Microbiology: Recent Results (from the past 240 hours)  Resp panel by RT-PCR (RSV, Flu A&B, Covid) Anterior Nasal Swab     Status: None   Collection Time: 09/30/23  5:06 AM   Specimen: Anterior Nasal Swab  Result Value Ref Range Status   SARS Coronavirus 2 by RT PCR NEGATIVE NEGATIVE Final    Comment: (NOTE) SARS-CoV-2 target nucleic acids are NOT DETECTED.  The SARS-CoV-2 RNA is generally detectable in upper respiratory specimens during the acute phase of infection. The lowest concentration of SARS-CoV-2 viral copies this assay can detect is 138 copies/mL. A negative result does not preclude SARS-Cov-2 infection and should not be used as the sole basis for treatment or other patient management decisions. A negative result  may occur with  improper specimen collection/handling, submission of specimen other than nasopharyngeal swab, presence of viral mutation(s) within the areas targeted by this assay, and inadequate number of viral copies(<138 copies/mL). A negative result must be combined with clinical observations, patient history, and epidemiological information. The expected result is Negative.  Fact Sheet for Patients:  BloggerCourse.com  Fact Sheet for Healthcare Providers:  SeriousBroker.it  This test is no t yet approved or cleared by the  [place]  FDA and  has been authorized for detection and/or diagnosis of SARS-CoV-2 by FDA under an Emergency Use Authorization (EUA). This EUA will remain  in effect (meaning this test can be used) for the duration of the COVID-19 declaration under Section 564(b)(1) of the Act, 21 U.S.C.section 360bbb-3(b)(1), unless the authorization is terminated  or revoked sooner.       Influenza A by PCR NEGATIVE NEGATIVE Final   Influenza B by PCR NEGATIVE NEGATIVE Final    Comment: (NOTE) The Xpert Xpress SARS-CoV-2/FLU/RSV plus assay is intended as an aid in the diagnosis of influenza from Nasopharyngeal swab specimens and should not be used as a sole basis for treatment. Nasal washings and aspirates are unacceptable for Xpert Xpress SARS-CoV-2/FLU/RSV testing.  Fact Sheet for Patients: BloggerCourse.com  Fact Sheet for Healthcare Providers: SeriousBroker.it  This test is not yet approved or cleared by the [place]  FDA and has been authorized for detection and/or diagnosis of SARS-CoV-2 by FDA under an Emergency Use Authorization (EUA). This EUA will remain in effect (meaning this test can be used) for the duration of the COVID-19 declaration under Section 564(b)(1) of the Act, 21 U.S.C. section 360bbb-3(b)(1), unless the authorization is terminated or revoked.     Resp Syncytial Virus by PCR NEGATIVE NEGATIVE Final    Comment: (NOTE) Fact Sheet for Patients: BloggerCourse.com  Fact Sheet for Healthcare Providers: SeriousBroker.it  This test is not yet approved or cleared by the [place]  FDA and has been authorized for detection and/or diagnosis of SARS-CoV-2 by FDA under an Emergency Use Authorization (EUA). This EUA will remain in effect (meaning this test can be used) for the duration of the COVID-19 declaration under Section 564(b)(1) of the Act, 21 U.S.C. section  360bbb-3(b)(1), unless the authorization is terminated or revoked.  Performed at [facility], [address] [place], [place] [address]   Blood Culture (routine x 2)     Status: Abnormal   Collection Time: 09/30/23  5:06 AM   Specimen: Left Antecubital; Blood  Result Value Ref Range Status   Specimen Description   Final    LEFT ANTECUBITAL Performed at [facility], [address], [place], [place] [address]    Special Requests   Final    BOTTLES DRAWN AEROBIC AND ANAEROBIC Blood Culture results may not be optimal due to an inadequate volume of blood received in culture bottles Performed at [facility], [address] [place], [place] [address]    Culture  Setup Time   Final    GRAM POSITIVE COCCI AEROBIC BOTTLE ONLY CRITICAL RESULT CALLED TO, READ BACK BY AND VERIFIED WITH:  NATHAN BELUE AT 0401 10/02/23 JG Performed at [facility], [address], [place], [place] [address]    Culture METHICILLIN RESISTANT STAPHYLOCOCCUS AUREUS (A)  Final   Report Status 10/04/2023 FINAL  Final   Organism ID, Bacteria METHICILLIN RESISTANT STAPHYLOCOCCUS AUREUS  Final      Susceptibility   Methicillin resistant staphylococcus aureus - MIC*  CIPROFLOXACIN  >=8 RESISTANT Resistant     ERYTHROMYCIN >=8 RESISTANT Resistant     GENTAMICIN <=0.5 SENSITIVE Sensitive     OXACILLIN >=4 RESISTANT Resistant     TETRACYCLINE <=1 SENSITIVE Sensitive     VANCOMYCIN  1 SENSITIVE Sensitive     TRIMETH /SULFA  <=10 SENSITIVE Sensitive     CLINDAMYCIN <=0.25 SENSITIVE Sensitive     RIFAMPIN <=0.5 SENSITIVE Sensitive     Inducible Clindamycin NEGATIVE Sensitive     LINEZOLID  2 SENSITIVE Sensitive     * METHICILLIN RESISTANT STAPHYLOCOCCUS AUREUS  Blood Culture (routine x 2)     Status: None   Collection Time: 09/30/23  5:06 AM   Specimen: BLOOD  Result Value Ref Range Status   Specimen Description BLOOD [facility]  Final   Special Requests   Final    BOTTLES DRAWN AEROBIC  AND ANAEROBIC Blood Culture adequate volume   Culture   Final    NO GROWTH 5 DAYS Performed at [facility], [address] [place], [place] [address]    Report Status 10/05/2023 FINAL  Final  Blood Culture ID Panel (Reflexed)     Status: Abnormal   Collection Time: 09/30/23  5:06 AM  Result Value Ref Range Status   Enterococcus faecalis NOT DETECTED NOT DETECTED Final   Enterococcus Faecium NOT DETECTED NOT DETECTED Final   Listeria monocytogenes NOT DETECTED NOT DETECTED Final   Staphylococcus species DETECTED (A) NOT DETECTED Final    Comment: CRITICAL RESULT CALLED TO, READ BACK BY AND VERIFIED WITH:  NATHAN BELUE AT 0401 10/02/23 JG    Staphylococcus aureus (BCID) DETECTED (A) NOT DETECTED Final    Comment: Methicillin (oxacillin)-resistant Staphylococcus aureus (MRSA). MRSA is predictably resistant to beta-lactam antibiotics (except ceftaroline). Preferred therapy is vancomycin  unless clinically contraindicated. Patient requires contact precautions if  hospitalized. CRITICAL RESULT CALLED TO, READ BACK BY AND VERIFIED WITH:  NATHAN BELUE AT 0401 10/02/23 JG    Staphylococcus epidermidis NOT DETECTED NOT DETECTED Final   Staphylococcus lugdunensis NOT DETECTED NOT DETECTED Final   Streptococcus species NOT DETECTED NOT DETECTED Final   Streptococcus agalactiae NOT DETECTED NOT DETECTED Final   Streptococcus pneumoniae NOT DETECTED NOT DETECTED Final   Streptococcus pyogenes NOT DETECTED NOT DETECTED Final   A.calcoaceticus-baumannii NOT DETECTED NOT DETECTED Final   Bacteroides fragilis NOT DETECTED NOT DETECTED Final   Enterobacterales NOT DETECTED NOT DETECTED Final   Enterobacter cloacae complex NOT DETECTED NOT DETECTED Final   Escherichia coli NOT DETECTED NOT DETECTED Final   Klebsiella aerogenes NOT DETECTED NOT DETECTED Final   Klebsiella oxytoca NOT DETECTED NOT DETECTED Final   Klebsiella pneumoniae NOT DETECTED NOT DETECTED Final   Proteus species NOT  DETECTED NOT DETECTED Final   Salmonella species NOT DETECTED NOT DETECTED Final   Serratia marcescens NOT DETECTED NOT DETECTED Final   Haemophilus influenzae NOT DETECTED NOT DETECTED Final   Neisseria meningitidis NOT DETECTED NOT DETECTED Final   Pseudomonas aeruginosa NOT DETECTED NOT DETECTED Final   Stenotrophomonas maltophilia NOT DETECTED NOT DETECTED Final   Candida albicans NOT DETECTED NOT DETECTED Final   Candida auris NOT DETECTED NOT DETECTED Final   Candida glabrata NOT DETECTED NOT DETECTED Final   Candida krusei NOT DETECTED NOT DETECTED Final   Candida parapsilosis NOT DETECTED NOT DETECTED Final   Candida tropicalis NOT DETECTED NOT DETECTED Final   Cryptococcus neoformans/gattii NOT DETECTED NOT DETECTED Final   Meth resistant mecA/C and MREJ DETECTED (A) NOT DETECTED Final    Comment: CRITICAL RESULT  CALLED TO, READ BACK BY AND VERIFIED WITHNoe Bath AT 0401 10/02/23 JG Performed at [facility], [address], [place], [place] [address]   Urine Culture     Status: Abnormal   Collection Time: 09/30/23  5:57 AM   Specimen: Urine, Suprapubic  Result Value Ref Range Status   Specimen Description   Final    URINE, SUPRAPUBIC Performed at [facility], [address], [place], [place] [address]    Special Requests   Final    NONE Reflexed from [id number] Performed at [facility], [address] [place], [place] [address]    Culture (A)  Final    >=100,000 COLONIES/mL ESCHERICHIA COLI Confirmed Extended Spectrum Beta-Lactamase Producer (ESBL).  In bloodstream infections from ESBL organisms, carbapenems are preferred over piperacillin/tazobactam. They are shown to have a lower risk of mortality. >=100,000 COLONIES/mL ENTEROCOCCUS FAECALIS    Report Status 10/02/2023 FINAL  Final   Organism ID, Bacteria ESCHERICHIA COLI (A)  Final   Organism ID, Bacteria ENTEROCOCCUS FAECALIS (A)  Final      Susceptibility   Escherichia coli - MIC*     AMPICILLIN  >=32 RESISTANT Resistant     CEFAZOLIN >=64 RESISTANT Resistant     CEFEPIME  8 INTERMEDIATE Intermediate     CEFTRIAXONE  >=64 RESISTANT Resistant     CIPROFLOXACIN  >=4 RESISTANT Resistant     GENTAMICIN <=1 SENSITIVE Sensitive     IMIPENEM 0.5 SENSITIVE Sensitive     NITROFURANTOIN <=16 SENSITIVE Sensitive     TRIMETH /SULFA  >=320 RESISTANT Resistant     AMPICILLIN /SULBACTAM 4 SENSITIVE Sensitive     PIP/TAZO <=4 SENSITIVE Sensitive ug/mL    * >=100,000 COLONIES/mL ESCHERICHIA COLI   Enterococcus faecalis - MIC*    AMPICILLIN  <=2 SENSITIVE Sensitive     NITROFURANTOIN <=16 SENSITIVE Sensitive     VANCOMYCIN  1 SENSITIVE Sensitive     * >=100,000 COLONIES/mL ENTEROCOCCUS FAECALIS  MRSA Next Gen by PCR, Nasal     Status: Abnormal   Collection Time: 10/01/23 10:05 PM   Specimen: Nasal Mucosa; Nasal Swab  Result Value Ref Range Status   MRSA by PCR Next Gen DETECTED (A) NOT DETECTED Final    Comment: RESULT CALLED TO, READ BACK BY AND VERIFIED WITH:  ERICKA KLARAS AT 0102 10/02/23 JG (NOTE) The GeneXpert MRSA Assay (FDA approved for NASAL specimens only), is one component of a comprehensive MRSA colonization surveillance program. It is not intended to diagnose MRSA infection nor to guide or monitor treatment for MRSA infections. Test performance is not FDA approved in patients less than 81 years old. Performed at [facility], [address] [place], [place] [address]   Culture, blood (Routine X 2) w Reflex to ID Panel     Status: None (Preliminary result)   Collection Time: 10/03/23 11:02 AM   Specimen: BLOOD  Result Value Ref Range Status   Specimen Description BLOOD RIGHT ANTECUBITAL  Final   Special Requests   Final    BOTTLES DRAWN AEROBIC AND ANAEROBIC Blood Culture adequate volume   Culture   Final    NO GROWTH 3 DAYS Performed at [facility], [address], [place], [place] [address]    Report Status PENDING  Incomplete  Culture, blood  (Routine X 2) w Reflex to ID Panel     Status: None (Preliminary result)   Collection Time: 10/03/23 11:10 AM   Specimen: BLOOD  Result Value Ref Range Status   Specimen Description BLOOD BLOOD RIGHT  FOREARM  Final   Special Requests   Final    BOTTLES DRAWN AEROBIC AND ANAEROBIC Blood Culture adequate volume   Culture   Final    NO GROWTH 3 DAYS Performed at [facility], [address] [place], [place] [address]    Report Status PENDING  Incomplete      Radiology Studies last 3 days: CT CHEST ABDOMEN PELVIS W CONTRAST Result Date: 10/06/2023 CLINICAL DATA:  Worsening leukocytosis and transaminitis despite antibiotic treatment. Unintentional weight loss. Known cavitary pneumonia. Clinical concern for possible abscess or malignancy. EXAM: CT CHEST, ABDOMEN, AND PELVIS WITH CONTRAST TECHNIQUE: Multidetector CT imaging of the chest, abdomen and pelvis was performed following the standard protocol during bolus administration of intravenous contrast. RADIATION DOSE REDUCTION: This exam was performed according to the departmental dose-optimization program which includes automated exposure control, adjustment of the mA and/or kV according to patient size and/or use of iterative reconstruction technique. CONTRAST:  80mL OMNIPAQUE  IOHEXOL  300 MG/ML  SOLN COMPARISON:  Chest CT dated 10/01/2023. Abdomen and pelvis CT dated 08/08/2023. FINDINGS: CT CHEST FINDINGS Cardiovascular: There is a moderate-sized pulmonary embolus in the distal left main pulmonary artery and extending into the proximal left upper lobe pulmonary artery without occlusion. A smaller embolus is demonstrated in a right lower lobe pulmonary artery branch. These are not sufficient to cause right heart strain. The previously demonstrated 4.5 cm ascending thoracic aorta aneurysm currently measures 4.3 cm. Enlarged central pulmonary arteries with a main pulmonary artery measuring 3.6 cm in diameter. Enlarged heart. Atheromatous  calcifications, including the coronary arteries and aorta. Slight increase in size of a small pericardial effusion with a maximum thickness of 8 mm. Mediastinum/Nodes: No enlarged mediastinal, hilar, or axillary lymph nodes. Thyroid  gland, trachea, and esophagus demonstrate no significant findings. Again demonstrated is a large hiatal hernia with an intrathoracic stomach. Lungs/Pleura: Increased size and confluence of the large cavitary lesion previously demonstrated in the right upper lobe with a moderately thick, irregular peripheral rind and adjacent airspace opacity. This continues to contain some fluid, with mild improvement. This area currently measures 13.2 x 8.2 cm on image number 94/4, previously 8.6 x 6.5 cm in corresponding dimensions. Continued patchy airspace opacity in the adjacent portions of the right upper lobe. These areas have increased some in size and decreased some in density. The previously demonstrated right middle lobe nodular density has cavitated with 2 adjacent areas of cavitation and patchy opacity currently demonstrated in that area. Small to moderate-sized bilateral pleural effusions, increased on the right and without significant change on the left. Mild bilateral lower lobe atelectasis. Musculoskeletal: Mild thoracic spine degenerative changes. No evidence of bony metastatic disease. CT ABDOMEN PELVIS FINDINGS Hepatobiliary: No focal liver abnormality is seen. No gallstones, gallbladder wall thickening, or biliary dilatation. Pancreas: Unremarkable. No pancreatic ductal dilatation or surrounding inflammatory changes. Spleen: Normal in size without focal abnormality. Adrenals/Urinary Tract: Moderate dilatation of the left renal collecting system and ureter with mild progression mild dilatation of the right renal collecting system and ureter with mild improvement. Again demonstrated is asymmetrical anterior bladder wall thickening, thicker on the right, with improvement. A suprapubic  bladder catheter is currently demonstrated. Stable fat density left adrenal myelolipoma and probable lipid poor left adrenal adenoma. Unremarkable right adrenal gland. Stomach/Bowel: Extensive sigmoid and descending colon diverticulosis without evidence of diverticulitis. The appendix is not visualized. Again noted is the entire stomach within a large hiatal hernia. Unremarkable small bowel. Vascular/Lymphatic: Atheromatous arterial calcifications without aneurysm. No enlarged lymph nodes.  Reproductive: Mildly enlarged and heterogeneous prostate gland with coarse calcifications. Other: No abdominal wall hernia or abnormality. No abdominopelvic ascites. Musculoskeletal: Moderate lumbar spine degenerative changes and mild scoliosis. IMPRESSION: 1. Bilateral pulmonary emboli, as described above. These are not sufficient to cause right heart strain. 2. Increased size and confluence of the large cavitary lesion previously demonstrated in the right upper lobe with a moderately thick, irregular peripheral rind and adjacent airspace opacity. This continues to contain some fluid, with mild improvement. This is most likely due to a cavitary pneumonia. A cavitary neoplasm is less likely since it was not present on the abdomen and pelvis CT obtained on 08/08/2023. 3. Continued patchy airspace opacity in the adjacent portions of the right upper lobe. These areas have increased some in size and decreased some in density, compatible with ongoing pneumonia. 4. The previously demonstrated right middle lobe nodular density has cavitated with 2 adjacent areas of cavitation and patchy opacity currently demonstrated in that area, also compatible with cavitary pneumonia. 5. Small to moderate-sized bilateral pleural effusions, increased on the right and without significant change on the left. 6. Mild bilateral lower lobe atelectasis. 7. Slight increase in size of a small pericardial effusion. 8. 4.3 cm ascending thoracic aorta aneurysm.  Recommend annual imaging followup by CTA or MRA. This recommendation follows 2010 ACCF/AHA/AATS/ACR/ASA/SCA/SCAI/SIR/STS/SVM Guidelines for the Diagnosis and Management of Patients with Thoracic Aortic Disease. Circulation. 2010; 121: Z610-R604. Aortic aneurysm NOS (ICD10-I71.9) 9. Enlarged central pulmonary arteries, suggesting pulmonary arterial hypertension. 10. Cardiomegaly. 11. Large hiatal hernia with an intrathoracic stomach. 12. Moderate dilatation of the left renal collecting system and ureter with mild progression. Mild dilatation of the right renal collecting system and ureter with mild improvement. This remains compatible with bilateral ureteral obstruction at the level of the ureterovesical junction 13. Asymmetrical anterior bladder wall thickening, thicker on the right, with improvement. A suprapubic bladder catheter is currently demonstrated. 14. Stable left adrenal myelolipoma and probable lipid poor adenoma. 15. Extensive sigmoid and descending colon diverticulosis without evidence of diverticulitis. 16. Mildly enlarged and heterogeneous prostate gland. 17.  Calcific coronary artery and aortic atherosclerosis. Aortic Atherosclerosis (ICD10-I70.0). Critical Value/emergent results were called by telephone at the time of interpretation on 10/06/2023 at 2:14 pm to provider Melodi Sprung , who verbally acknowledged these results. Electronically Signed   By: Catherin Closs M.D.   On: 10/06/2023 14:32        Time spent: 50 min     Braydn Carneiro, DO [facility] 10/06/2023, 2:53 PM    Dictation software may have been used to generate the above note. Typos may occur and escape review in typed/dictated notes. Please contact Dr Authur Leghorn directly for clarity if needed.  Staff may message me via secure chat in Epic  but this may not receive an immediate response,  please page me for urgent matters!  If 7PM-7AM, please contact night coverage [email or web address]

## 2023-10-06 NOTE — Progress Notes (Signed)
 SLP Cancellation Note  Patient Details Name: Zachary [name] MRN: [id number] DOB: 07-28-1940   Cancelled treatment:       Reason Eval/Treat Not Completed:  (chart reviewed; consulted MD re: pt's status.   MD indicated pt had No ST services needs at this time. NSG updated and agreed.)     Darla Edward, MS, CCC-SLP Speech Language Pathologist Rehab Services; [facility] [phone] (ascom) Bettejane Leavens 10/06/2023, 3:13 PM

## 2023-10-06 NOTE — Progress Notes (Signed)
 Date of Admission:  09/30/2023      ID: Zachary [name] is a 83 y.o. male  Principal Problem:   Sepsis (HCC) Active Problems:   Mixed hyperlipidemia   Essential hypertension   Acute kidney injury superimposed on chronic kidney disease (HCC)   Pneumonia   UTI (urinary tract infection)   Acute encephalopathy   CKD (chronic kidney disease) stage 4, GFR 15-29 ml/min (HCC)   NSTEMI (non-ST elevated myocardial infarction) (HCC)   Protein-calorie malnutrition, severe   MRSA bacteremia    Subjective: Pt has no specific complaints No cough or sob week  Medications:   Chlorhexidine  Gluconate Cloth  6 each Topical Q0600   enoxaparin  (LOVENOX ) injection  30 mg Subcutaneous Q24H   feeding supplement  237 mL Oral TID BM   multivitamin with minerals  1 tablet Oral Daily    Objective: Vital signs in last 24 hours: Patient Vitals for the past 24 hrs:  BP Temp Temp src Pulse Resp SpO2  10/06/23 0837 104/77 97.9 F (36.6 C) Oral 74 16 100 %  10/06/23 0458 90/66 98.6 F (37 C) Oral (!) 57 16 97 %  10/06/23 0007 93/60 98.4 F (36.9 C) Oral 73 18 97 %  10/05/23 2011 99/68 98.5 F (36.9 C) Oral (!) 53 18 96 %  10/05/23 1813 109/68 (!) 97.2 F (36.2 C) -- 89 18 95 %  10/05/23 1300 102/70 97.8 F (36.6 C) Oral 62 19 95 %      PHYSICAL EXAM:  General: Alert, cooperative, no distress, emaciated Lungs: b/l air entry- crepts rt side Heart: Regular rate and rhythm, no murmur, rub or gallop. Abdomen: Soft, non-tender,not distended. Bowel sounds normal. No masses Extremities: atraumatic, no cyanosis. No edema. No clubbing Skin: No rashes or lesions. Or bruising Lymph: Cervical, supraclavicular normal. Neurologic: Grossly non-focal  Lab Results    Latest Ref Rng & Units 10/06/2023    4:34 AM 10/04/2023    4:32 AM 10/01/2023    4:29 AM  CBC  WBC 4.0 - 10.5 K/uL 19.8  15.1  8.1   Hemoglobin 13.0 - 17.0 g/dL 9.2  9.7  8.5   Hematocrit 39.0 - 52.0 % 27.6  29.3  25.2   Platelets  150 - 400 K/uL 338  281  184        Latest Ref Rng & Units 10/06/2023    4:34 AM 10/05/2023    4:24 AM 10/04/2023    4:32 AM  CMP  Glucose 70 - 99 mg/dL 409   811   BUN 8 - 23 mg/dL 54   59   Creatinine 9.14 - 1.24 mg/dL 7.82  9.56  2.13   Sodium 135 - 145 mmol/L 137   134   Potassium 3.5 - 5.1 mmol/L 4.2   3.9   Chloride 98 - 111 mmol/L 106   102   CO2 22 - 32 mmol/L 22   21   Calcium  8.9 - 10.3 mg/dL 8.1   8.1   Total Protein 6.5 - 8.1 g/dL 4.7   4.8   Total Bilirubin 0.0 - 1.2 mg/dL 0.8   0.6   Alkaline Phos 38 - 126 U/L 223   152   AST 15 - 41 U/L 130   78   ALT 0 - 44 U/L 152   72       Microbiology: Blood culture MRSA  Studies/Results: No results found.    Assessment/Plan: MRSA bacteremia Likely source MRSA cavitary pneumonia Patient was on  vancomycin  and then changed to linezolid  and now he is agreeable for treatment so will be going back on IV vancomycin  Will repeat blood cultures 2D echo done Will need TEE if 2d echo does not show any vegetations   Worsening leucocytosis Abnormal LFTS  Anemia  Weight loss of 40 pounds  History of urinary retention has suprapubic catheter ESBL E. coli and Enterococcus in urine culture.  Will add meropenem  because of leucocytosis    AKI on CKD Observe closely as he is on vancomycin  If it increases we will have to switch  back to linezolid .    Discussed the management with the patient, and e hospitalist and pharmacist

## 2023-10-06 NOTE — Consult Note (Signed)
 PHARMACY - ANTICOAGULATION CONSULT NOTE  Pharmacy Consult for Heparin   Indication: pulmonary embolus  No Known Allergies  Patient Measurements: Height: 6\' 2"  (188 cm) Weight: 56.7 kg (125 lb) IBW/kg (Calculated) : 82.2 HEPARIN  DW (KG): 56.7  Vital Signs: Temp: 97.8 F (36.6 C) (05/05 1233) Temp Source: Oral (05/05 1233) BP: 117/65 (05/05 1233) Pulse Rate: 77 (05/05 1233)  Labs: Recent Labs    10/04/23 0432 10/05/23 0424 10/06/23 0434 10/06/23 1307  HGB 9.7*  --  9.2*  --   HCT 29.3*  --  27.6*  --   PLT 281  --  338  --   CREATININE 1.70* 1.60* 1.54*  --   CKTOTAL  --   --   --  13*    Estimated Creatinine Clearance: 30.7 mL/min (A) (by C-G formula based on SCr of 1.54 mg/dL (H)).   Medical History: Past Medical History:  Diagnosis Date   B12 deficiency anemia 08/03/2019   Hypercholesterolemia    Hypertension    Vertigo    last episode approx 09/2018    Medications:  Medications Prior to Admission  Medication Sig Dispense Refill Last Dose/Taking   acetaminophen  (TYLENOL ) 325 MG tablet Take 2 tablets (650 mg total) by mouth every 6 (six) hours as needed for mild pain (pain score 1-3), headache or fever (or Fever >/= 101).   Past Week   atorvastatin  (LIPITOR) 10 MG tablet Take 10 mg by mouth daily.   09/29/2023   calcium  carbonate (OS-CAL - DOSED IN MG OF ELEMENTAL CALCIUM ) 1250 (500 Ca) MG tablet Take 1 tablet (1,250 mg total) by mouth 2 (two) times daily with a meal. 30 tablet 0 09/29/2023   Multiple Vitamin (MULTIVITAMIN WITH MINERALS) TABS tablet Take 1 tablet by mouth daily.   09/29/2023   triamterene -hydrochlorothiazide (DYAZIDE) 37.5-25 MG capsule Take 1 capsule by mouth daily.   09/29/2023   [Paused] aspirin  EC 81 MG tablet Take 81 mg by mouth at bedtime. Pt states he has resumed as of 09/26/23      Scheduled:   Chlorhexidine  Gluconate Cloth  6 each Topical Q0600   feeding supplement  237 mL Oral TID BM   multivitamin with minerals  1 tablet Oral Daily    Infusions:   vancomycin      PRN: guaiFENesin-dextromethorphan, ondansetron  **OR** ondansetron  (ZOFRAN ) IV Anti-infectives (From admission, onward)    Start     Dose/Rate Route Frequency Ordered Stop   10/06/23 1400  vancomycin  (VANCOREADY) IVPB 750 mg/150 mL        750 mg 150 mL/hr over 60 Minutes Intravenous Every 24 hours 10/06/23 1135     10/04/23 1400  vancomycin  (VANCOREADY) IVPB 500 mg/100 mL  Status:  Discontinued        500 mg 100 mL/hr over 60 Minutes Intravenous Every 24 hours 10/03/23 1624 10/06/23 1135   10/03/23 1800  vancomycin  (VANCOREADY) IVPB 500 mg/100 mL        500 mg 100 mL/hr over 60 Minutes Intravenous  Once 10/03/23 1624 10/03/23 1859   10/02/23 2200  linezolid  (ZYVOX ) tablet 600 mg  Status:  Discontinued        600 mg Oral Every 12 hours 10/02/23 1550 10/03/23 1501   10/01/23 0900  vancomycin  (VANCOCIN ) IVPB 1000 mg/200 mL premix  Status:  Discontinued        1,000 mg 200 mL/hr over 60 Minutes Intravenous Every 48 hours 10/01/23 0736 10/02/23 1550   10/01/23 0800  vancomycin  variable dose per unstable renal function (pharmacist dosing)  Status:  Discontinued         Does not apply See admin instructions 09/30/23 0747 10/01/23 0738   09/30/23 1000  meropenem  (MERREM ) 500 mg in sodium chloride  0.9 % 100 mL IVPB  Status:  Discontinued        500 mg 200 mL/hr over 30 Minutes Intravenous Every 12 hours 09/30/23 0728 10/02/23 1550   09/30/23 0500  ceFEPIme  (MAXIPIME ) 2 g in sodium chloride  0.9 % 100 mL IVPB        2 g 200 mL/hr over 30 Minutes Intravenous  Once 09/30/23 0458 09/30/23 0608   09/30/23 0500  metroNIDAZOLE  (FLAGYL ) IVPB 500 mg        500 mg 100 mL/hr over 60 Minutes Intravenous  Once 09/30/23 0458 09/30/23 0826   09/30/23 0500  vancomycin  (VANCOCIN ) IVPB 1000 mg/200 mL premix        1,000 mg 200 mL/hr over 60 Minutes Intravenous  Once 09/30/23 0458 09/30/23 2841       Assessment: 83 y.o. male with medical history significant of HTN, urinary  retention s/p suprapubic catheter placement,  HLD. Presents to ED from home via EMS w/ weakness. CT scan: There is a moderate-sized pulmonary embolus in the distal left main pulmonary artery and extending into the proximal left upper lobe pulmonary artery without occlusion. Hgb and plt stable. No DOAC PTA.   Goal of Therapy:  Heparin  level 0.3-0.7 units/ml Monitor platelets by anticoagulation protocol: Yes   Plan:  Give 3400 units bolus x 1 Start heparin  infusion at 900 units/hr Check anti-Xa level in 8 hours and daily while on heparin  Continue to monitor H&H and platelets  Trinidad Funk, PharmD, BCPS 10/06/2023,3:04 PM

## 2023-10-06 NOTE — Plan of Care (Signed)
°  Problem: Fluid Volume: °Goal: Hemodynamic stability will improve °Outcome: Progressing °  °Problem: Clinical Measurements: °Goal: Diagnostic test results will improve °Outcome: Progressing °Goal: Signs and symptoms of infection will decrease °Outcome: Progressing °  °

## 2023-10-07 ENCOUNTER — Encounter
Admission: EM | Disposition: A | Discharge status: Skilled Nursing Facility | Payer: Self-pay | Source: Home / Self Care | Attending: Osteopathic Medicine

## 2023-10-07 ENCOUNTER — Telehealth (HOSPITAL_COMMUNITY): Payer: Self-pay | Admitting: Pharmacy Technician

## 2023-10-07 ENCOUNTER — Inpatient Hospital Stay

## 2023-10-07 ENCOUNTER — Inpatient Hospital Stay: Admit: 2023-10-07

## 2023-10-07 ENCOUNTER — Inpatient Hospital Stay: Admitting: Anesthesiology

## 2023-10-07 ENCOUNTER — Inpatient Hospital Stay: Admit: 2023-10-07 | Discharge: 2023-10-07 | Disposition: A | Discharge status: Home / Self Care | Attending: Student

## 2023-10-07 ENCOUNTER — Other Ambulatory Visit (HOSPITAL_COMMUNITY): Payer: Self-pay

## 2023-10-07 DIAGNOSIS — R7989 Other specified abnormal findings of blood chemistry: Secondary | ICD-10-CM

## 2023-10-07 DIAGNOSIS — J189 Pneumonia, unspecified organism: Secondary | ICD-10-CM | POA: Diagnosis not present

## 2023-10-07 DIAGNOSIS — R6520 Severe sepsis without septic shock: Secondary | ICD-10-CM | POA: Diagnosis not present

## 2023-10-07 DIAGNOSIS — N390 Urinary tract infection, site not specified: Secondary | ICD-10-CM | POA: Diagnosis not present

## 2023-10-07 DIAGNOSIS — G9341 Metabolic encephalopathy: Secondary | ICD-10-CM | POA: Diagnosis not present

## 2023-10-07 DIAGNOSIS — A403 Sepsis due to Streptococcus pneumoniae: Secondary | ICD-10-CM | POA: Diagnosis not present

## 2023-10-07 DIAGNOSIS — N184 Chronic kidney disease, stage 4 (severe): Secondary | ICD-10-CM

## 2023-10-07 DIAGNOSIS — A419 Sepsis, unspecified organism: Secondary | ICD-10-CM | POA: Diagnosis not present

## 2023-10-07 DIAGNOSIS — B9562 Methicillin resistant Staphylococcus aureus infection as the cause of diseases classified elsewhere: Secondary | ICD-10-CM | POA: Diagnosis not present

## 2023-10-07 HISTORY — PX: TEE WITHOUT CARDIOVERSION: SHX5443

## 2023-10-07 LAB — ECHO TEE
AR max vel: 1.46 cm2
AV Area VTI: 1.4 cm2
AV Area mean vel: 1.34 cm2
AV Mean grad: 11.5 mmHg
AV Peak grad: 20.9 mmHg
Ao pk vel: 2.29 m/s

## 2023-10-07 LAB — COMPREHENSIVE METABOLIC PANEL WITH GFR
ALT: 135 U/L — ABNORMAL HIGH (ref 0–44)
AST: 94 U/L — ABNORMAL HIGH (ref 15–41)
Albumin: 1.7 g/dL — ABNORMAL LOW (ref 3.5–5.0)
Alkaline Phosphatase: 219 U/L — ABNORMAL HIGH (ref 38–126)
Anion gap: 9 (ref 5–15)
BUN: 53 mg/dL — ABNORMAL HIGH (ref 8–23)
CO2: 21 mmol/L — ABNORMAL LOW (ref 22–32)
Calcium: 8 mg/dL — ABNORMAL LOW (ref 8.9–10.3)
Chloride: 102 mmol/L (ref 98–111)
Creatinine, Ser: 1.43 mg/dL — ABNORMAL HIGH (ref 0.61–1.24)
GFR, Estimated: 50 mL/min — ABNORMAL LOW (ref >60)
Glucose, Bld: 101 mg/dL — ABNORMAL HIGH (ref 70–99)
Potassium: 4.3 mmol/L (ref 3.5–5.1)
Sodium: 132 mmol/L — ABNORMAL LOW (ref 135–145)
Total Bilirubin: 0.8 mg/dL (ref 0.0–1.2)
Total Protein: 4.9 g/dL — ABNORMAL LOW (ref 6.5–8.1)

## 2023-10-07 LAB — CBC
HCT: 26.8 % — ABNORMAL LOW (ref 39.0–52.0)
Hemoglobin: 9 g/dL — ABNORMAL LOW (ref 13.0–17.0)
MCH: 32.7 pg (ref 26.0–34.0)
MCHC: 33.6 g/dL (ref 30.0–36.0)
MCV: 97.5 fL (ref 80.0–100.0)
Platelets: 350 10*3/uL (ref 150–400)
RBC: 2.75 MIL/uL — ABNORMAL LOW (ref 4.22–5.81)
RDW: 14.7 % (ref 11.5–15.5)
WBC: 20.8 10*3/uL — ABNORMAL HIGH (ref 4.0–10.5)
nRBC: 0 % (ref 0.0–0.2)

## 2023-10-07 LAB — HEPARIN LEVEL (UNFRACTIONATED)
Heparin Unfractionated: 0.18 [IU]/mL — ABNORMAL LOW (ref 0.30–0.70)
Heparin Unfractionated: 0.29 [IU]/mL — ABNORMAL LOW (ref 0.30–0.70)
Heparin Unfractionated: 0.29 [IU]/mL — ABNORMAL LOW (ref 0.30–0.70)

## 2023-10-07 LAB — BILIRUBIN, FRACTIONATED(TOT/DIR/INDIR)
Bilirubin, Direct: 0.2 mg/dL (ref 0.0–0.2)
Indirect Bilirubin: 0.7 mg/dL (ref 0.3–0.9)
Total Bilirubin: 0.9 mg/dL (ref 0.0–1.2)

## 2023-10-07 LAB — STREP PNEUMONIAE URINARY ANTIGEN: Strep Pneumo Urinary Antigen: NEGATIVE

## 2023-10-07 SURGERY — ECHOCARDIOGRAM, TRANSESOPHAGEAL
Anesthesia: General

## 2023-10-07 MED ORDER — HEPARIN BOLUS VIA INFUSION
1700.0000 [IU] | Freq: Once | INTRAVENOUS | Status: AC
  Administered 2023-10-07: 1700 [IU] via INTRAVENOUS
  Filled 2023-10-07: qty 1700

## 2023-10-07 MED ORDER — PROPOFOL 10 MG/ML IV BOLUS
INTRAVENOUS | Status: DC | PRN
  Administered 2023-10-07: 10 mg via INTRAVENOUS
  Administered 2023-10-07: 20 mg via INTRAVENOUS
  Administered 2023-10-07: 30 mg via INTRAVENOUS
  Administered 2023-10-07: 20 mg via INTRAVENOUS

## 2023-10-07 MED ORDER — HEPARIN BOLUS VIA INFUSION
900.0000 [IU] | Freq: Once | INTRAVENOUS | Status: AC
  Administered 2023-10-07: 900 [IU] via INTRAVENOUS
  Filled 2023-10-07: qty 900

## 2023-10-07 MED ORDER — BUTAMBEN-TETRACAINE-BENZOCAINE 2-2-14 % EX AERO
INHALATION_SPRAY | CUTANEOUS | Status: AC
  Filled 2023-10-07: qty 5

## 2023-10-07 MED ORDER — DEXMEDETOMIDINE HCL IN NACL 80 MCG/20ML IV SOLN
INTRAVENOUS | Status: DC | PRN
Start: 2023-10-07 — End: 2023-10-07
  Administered 2023-10-07: 8 ug via INTRAVENOUS

## 2023-10-07 MED ORDER — PHENYLEPHRINE 80 MCG/ML (10ML) SYRINGE FOR IV PUSH (FOR BLOOD PRESSURE SUPPORT)
PREFILLED_SYRINGE | INTRAVENOUS | Status: AC
  Filled 2023-10-07: qty 10

## 2023-10-07 MED ORDER — PHENYLEPHRINE 80 MCG/ML (10ML) SYRINGE FOR IV PUSH (FOR BLOOD PRESSURE SUPPORT)
PREFILLED_SYRINGE | INTRAVENOUS | Status: DC | PRN
  Administered 2023-10-07: 80 ug via INTRAVENOUS
  Administered 2023-10-07 (×3): 160 ug via INTRAVENOUS
  Administered 2023-10-07: 80 ug via INTRAVENOUS
  Administered 2023-10-07: 160 ug via INTRAVENOUS

## 2023-10-07 MED ORDER — HEPARIN BOLUS VIA INFUSION
850.0000 [IU] | Freq: Once | INTRAVENOUS | Status: AC
  Administered 2023-10-07: 850 [IU] via INTRAVENOUS
  Filled 2023-10-07: qty 850

## 2023-10-07 MED ORDER — EPHEDRINE SULFATE-NACL 50-0.9 MG/10ML-% IV SOSY
PREFILLED_SYRINGE | INTRAVENOUS | Status: DC | PRN
  Administered 2023-10-07: 5 mg via INTRAVENOUS
  Administered 2023-10-07: 10 mg via INTRAVENOUS

## 2023-10-07 MED ORDER — HEPARIN BOLUS VIA INFUSION
900.0000 [IU] | Freq: Once | INTRAVENOUS | Status: DC
  Filled 2023-10-07: qty 900

## 2023-10-07 MED ORDER — HEPARIN (PORCINE) 25000 UT/250ML-% IV SOLN
1300.0000 [IU]/h | INTRAVENOUS | Status: DC
  Administered 2023-10-07: 1200 [IU]/h via INTRAVENOUS
  Administered 2023-10-08 – 2023-10-10 (×3): 1300 [IU]/h via INTRAVENOUS
  Filled 2023-10-07 (×4): qty 250

## 2023-10-07 MED ORDER — LIDOCAINE VISCOUS HCL 2 % MT SOLN
OROMUCOSAL | Status: AC
  Filled 2023-10-07: qty 15

## 2023-10-07 NOTE — Plan of Care (Signed)

## 2023-10-07 NOTE — Progress Notes (Signed)
 Palliative Care Progress Note, Assessment & Plan   Patient Name: Zachary [name]       Date: 10/07/2023 DOB: [birth year]  Age: 83 y.o. MRN#: [id number] Attending Physician: Melodi Sprung, DO Primary Care Physician: [name], MD Admit Date: 09/30/2023  Subjective: Patient is lying in bed, awake alert and oriented x 4.  He acknowledges my presence and is able to make his wishes known.  No family or friends present during my visit.  HPI: Zachary [name] is a 83 y.o. male with medical history significant of HTN, urinary retention s/p suprapubic catheter placement,  HLD. Presents to ED from home via EMS w/ weakness.    Of note, hospitalized last month 03/04-03/07/25 w/ substantial urinary retention and AKI, Foley placed, f/u urology and suprapubic cath was placed 08/26/23. Also notable, has been following w/ PCP last visit 09/24/23 concern for unintentional weight loss, SPEP --> pending. Has been following w/ hematology last visit 09/26/23 for anemia and lung nodule, flow cytometry --> neg, CT chest --> pending    04/29: admitted to hospitalist service w/ sepsis, pna, uti, encephalopathy. ICU consult but he did not need pressors 04/30: await cultures, CT chest to eval pna/other RML. Alert today. 05/01: CT demonstrating cavitary pneumonia.  1 blood culture positive, concern for MRSA.  Patient requesting to leave AMA, see progress note and IPAL note.  Will stay at least through tonight.  ID has seen patient, considering for p.o. antibiotics in case patient is wanting to go home tomorrow 05/02: pt has reconsidered for treatment, he states he "made a mistake" yesterday and is now open to continuing IV antibiotics and going ot rehab hopefully same facility as his wife will be going to post-op.  Palliative to follow either way.  05/03-05/04: repeat BCx negative thus far, continue current IV abx, placement to follow after the weekend  05/05: liver enzymes continue to elevate as well as alk phos, WBC also worsening, pt reports more fatigued today but no other symptoms to explain findings, CT chest/abd/pelv ordered --> d/w radiologist, (+)PE nonocclusive and no apparent RV strain, worsening cavitary lung lesion w/u as below and pulm consult, consult w/ cardio re: TEE vs TTE given bacteremia and now PE, started heparin  and dc lovenox   PMT had backed away from daily visits but reengaged given patient's clinical status change.  Summary of counseling/coordination of care: Extensive chart review completed prior to meeting patient including labs, vital signs, imaging, progress notes, orders, and available advanced directive documents from current and previous encounters.   After reviewing the patient's chart and assessing the patient at bedside, I spoke with patient in regards to symptom management and goals of care.   Symptoms assessed.  Patient says he feels fine, has more energy, and feels more like himself.  However, he shares that it is extremely weakening for him to get up and move out of the bed.  He has no acute complaints at this time other than feeling weak with movement.  No adjustment to [facility] needed.  Attempted to gauge patient's understanding of his current medical situation.  He endorses that he is in agreement with planned TEE.  He states he is in good hands.  He  also shares that he just wants to get better so that he can be home with his wife.  However, endorses he does not want to do anything to sabotage himself getting "as well as I can get".   Education provided on TEE, 2D echo, elevated liver enzymes, increasing WBCs, and various antibiotic therapies to address patient's necrotizing pneumonia.  TEE scheduled for today. Patient endorses he remains in agreement with current plan of  care.  He says he feels like he is in "good hands" and just wants to get better.  No acute symptom management needs at this time.  Ongoing support to continue.  PMT will continue to follow and support patient throughout his hospitalization.  Physical Exam Vitals reviewed.  Constitutional:      General: He is not in acute distress.    Appearance: He is normal weight. He is not ill-appearing.  HENT:     Head: Normocephalic.     Mouth/Throat:     Mouth: Mucous membranes are moist.  Eyes:     Pupils: Pupils are equal, round, and reactive to light.  Cardiovascular:     Pulses: Normal pulses.  Pulmonary:     Effort: Pulmonary effort is normal.  Abdominal:     Palpations: Abdomen is soft.  Musculoskeletal:     Comments: Generalized weakness  Skin:    General: Skin is warm and dry.     Coloration: Skin is pale.  Neurological:     Mental Status: He is alert and oriented to person, place, and time.  Psychiatric:        Mood and Affect: Mood normal.        Behavior: Behavior normal.        Thought Content: Thought content normal.        Judgment: Judgment normal.             Total Time 25 minutes   Time spent includes: Detailed review of medical records (labs, imaging, vital signs), medically appropriate exam (mental status, respiratory, cardiac, skin), discussed with treatment team, counseling and educating patient, family and staff, documenting clinical information, medication management and coordination of care.  Judeen Nose L. Rebbeca Campi, DNP, FNP-BC Palliative Medicine Team

## 2023-10-07 NOTE — Progress Notes (Signed)
 Date of Admission:  09/30/2023      ID: Zachary [name] is a 83 y.o. male  Principal Problem:   Sepsis (HCC) Active Problems:   Mixed hyperlipidemia   Essential hypertension   Acute kidney injury superimposed on chronic kidney disease (HCC)   Pneumonia   UTI (urinary tract infection)   Acute encephalopathy   CKD (chronic kidney disease) stage 4, GFR 15-29 ml/min (HCC)   NSTEMI (non-ST elevated myocardial infarction) (HCC)   Protein-calorie malnutrition, severe   MRSA bacteremia   Cavitary pneumonia    Subjective: Patient had TEE today.  Feeling tired No fever No cough  Medications:   Chlorhexidine  Gluconate Cloth  6 each Topical Q0600   feeding supplement  237 mL Oral TID BM   heparin   900 Units Intravenous Once   multivitamin with minerals  1 tablet Oral Daily    Objective: Vital signs in last 24 hours: Patient Vitals for the past 24 hrs:  BP Temp Temp src Pulse Resp SpO2  10/07/23 1345 (!) 98/59 -- -- 62 13 95 %  10/07/23 1330 99/62 -- -- 67 17 99 %  10/07/23 1315 (!) 87/64 -- -- (!) 55 (!) 22 100 %  10/07/23 1311 93/81 -- -- 63 19 100 %  10/07/23 1305 (!) 95/56 -- -- -- -- --  10/07/23 1254 104/60 -- -- 65 (!) 24 99 %  10/07/23 1253 -- -- -- 68 (!) 28 98 %  10/07/23 1252 (!) 87/59 -- -- 65 18 99 %  10/07/23 1251 93/65 -- -- 63 20 99 %  10/07/23 1250 (!) 80/54 -- -- 67 (!) 21 100 %  10/07/23 1249 -- -- -- 62 19 100 %  10/07/23 1248 (!) 80/52 -- -- (!) 54 (!) 22 99 %  10/07/23 1247 -- -- -- (!) 53 (!) 22 100 %  10/07/23 1246 90/65 -- -- (!) 48 20 100 %  10/07/23 1245 -- -- -- 61 19 100 %  10/07/23 1244 (!) 88/60 -- -- (!) 57 19 99 %  10/07/23 1243 -- -- -- 66 18 100 %  10/07/23 1242 (!) 71/52 -- -- 62 20 100 %  10/07/23 1241 -- -- -- 61 18 100 %  10/07/23 1240 (!) 75/49 -- -- (!) 54 18 100 %  10/07/23 1208 106/66 97.8 F (36.6 C) Oral -- -- 97 %  10/07/23 1053 95/62 98.1 F (36.7 C) -- (!) 51 18 100 %  10/07/23 1050 95/62 98.1 F (36.7 C) Oral 78  19 100 %  10/07/23 0750 106/61 98.4 F (36.9 C) Oral 66 18 --  10/07/23 0430 106/62 -- -- -- -- --  10/07/23 0400 92/60 97.9 F (36.6 C) -- (!) 51 18 97 %  10/07/23 0230 (!) 96/58 -- -- -- -- --  10/06/23 2338 (!) 88/63 98 F (36.7 C) -- 72 20 97 %  10/06/23 2017 99/65 97.8 F (36.6 C) -- 72 18 100 %  10/06/23 1650 115/66 98.5 F (36.9 C) Oral 69 18 100 %      PHYSICAL EXAM:  General: Alert, cooperative, no distress, emaciated Lungs: b/l air entry- crepts rt side Heart: Regular rate and rhythm, no murmur, rub or gallop. Abdomen: Soft, suprapubic catheter present Extremities: atraumatic, no cyanosis. No edema. No clubbing Skin: No rashes or lesions. Or bruising Lymph: Cervical, supraclavicular normal. Neurologic: Grossly non-focal  Lab Results    Latest Ref Rng & Units 10/07/2023    3:16 AM 10/06/2023    4:34  AM 10/04/2023    4:32 AM  CBC  WBC 4.0 - 10.5 K/uL 20.8  19.8  15.1   Hemoglobin 13.0 - 17.0 g/dL 9.0  9.2  9.7   Hematocrit 39.0 - 52.0 % 26.8  27.6  29.3   Platelets 150 - 400 K/uL 350  338  281        Latest Ref Rng & Units 10/07/2023    3:16 AM 10/06/2023    4:34 AM 10/05/2023    4:24 AM  CMP  Glucose 70 - 99 mg/dL 161  096    BUN 8 - 23 mg/dL 53  54    Creatinine 0.45 - 1.24 mg/dL 4.09  8.11  9.14   Sodium 135 - 145 mmol/L 132  137    Potassium 3.5 - 5.1 mmol/L 4.3  4.2    Chloride 98 - 111 mmol/L 102  106    CO2 22 - 32 mmol/L 21  22    Calcium  8.9 - 10.3 mg/dL 8.0  8.1    Total Protein 6.5 - 8.1 g/dL 4.9  4.7    Total Bilirubin 0.0 - 1.2 mg/dL 0.8  0.8    Alkaline Phos 38 - 126 U/L 219  223    AST 15 - 41 U/L 94  130    ALT 0 - 44 U/L 135  152        Microbiology: 09/30/2023. 1 out of 2 sets blood culture MRSA 09/30/2023 urine culture ESBL E. coli and Enterococcus  Studies/Results: ECHO TEE Result Date: 10/07/2023    TRANSESOPHOGEAL ECHO REPORT   Patient Name:   Zachary [name] Date of Exam: 10/07/2023 Medical Rec #:  [id number]           Height:        74.0 in Accession #:    [id number]          Weight:       125.0 lb Date of Birth:  1940/10/04           BSA:          1.780 m Patient Age:    82 years            BP:           95/62 mmHg Patient Gender: M                   HR:           51 bpm. Exam Location:  [facility] Procedure: Transesophageal Echo, Cardiac Doppler and Color Doppler (Both            Spectral and Color Flow Doppler were utilized during procedure). Indications:     Not listed on TEE check-in sheet  History:         Patient has no prior history of Echocardiogram examinations.                  Risk Factors:Hypertension.  Sonographer:     Broadus Canes Referring Phys:  [id number] CARALYN HUDSON Diagnosing Phys: Joetta Mustache PROCEDURE: After discussion of the risks and benefits of a TEE, an informed consent was obtained from the patient. The transesophogeal probe was passed without difficulty through the esophogus of the patient. Local oropharyngeal anesthetic was provided with Cetacaine. Sedation performed by different physician. The patient was monitored while under deep sedation. Image quality was good. The patient's vital signs; including heart rate, blood pressure, and oxygen  saturation; remained stable throughout the  procedure. The patient  developed no complications during the procedure.  IMPRESSIONS  1. Left ventricular ejection fraction, by estimation, is 35 to 40%. The left ventricle has moderately decreased function.  2. Right ventricular systolic function is mildly reduced. The right ventricular size is normal.  3. Left atrial size was severely dilated. No left atrial/left atrial appendage thrombus was detected.  4. The mitral valve is normal in structure. Mild mitral valve regurgitation.  5. Low flow low gradient aortic stenosis, moderate. The aortic valve is tricuspid. Aortic valve regurgitation is mild to moderate. Conclusion(s)/Recommendation(s): No evidence of vegetation/infective endocarditis on this transesophageael echocardiogram. FINDINGS   Left Ventricle: Left ventricular ejection fraction, by estimation, is 35 to 40%. The left ventricle has moderately decreased function. The left ventricular internal cavity size was normal in size. Right Ventricle: The right ventricular size is normal. No increase in right ventricular wall thickness. Right ventricular systolic function is mildly reduced. Left Atrium: Left atrial size was severely dilated. No left atrial/left atrial appendage thrombus was detected. Right Atrium: Right atrial size was normal in size. Pericardium: There is no evidence of pericardial effusion. Mitral Valve: The mitral valve is normal in structure. Mild mitral valve regurgitation. Tricuspid Valve: The tricuspid valve is normal in structure. Tricuspid valve regurgitation is mild. Aortic Valve: Low flow low gradient aortic stenosis, moderate. The aortic valve is tricuspid. Aortic valve regurgitation is mild to moderate. Aortic valve mean gradient measures 11.5 mmHg. Aortic valve peak gradient measures 20.9 mmHg. Aortic valve area,  by VTI measures 1.40 cm. Pulmonic Valve: The pulmonic valve was normal in structure. Pulmonic valve regurgitation is trivial. Aorta: The aortic root is normal in size and structure. IAS/Shunts: No atrial level shunt detected by color flow Doppler.  LEFT VENTRICLE PLAX 2D LVOT diam:     2.60 cm LV SV:         56 LV SV Index:   32 LVOT Area:     5.31 cm  AORTIC VALVE AV Area (Vmax):    1.46 cm AV Area (Vmean):   1.34 cm AV Area (VTI):     1.40 cm AV Vmax:           228.50 cm/s AV Vmean:          155.000 cm/s AV VTI:            0.402 m AV Peak Grad:      20.9 mmHg AV Mean Grad:      11.5 mmHg LVOT Vmax:         62.70 cm/s LVOT Vmean:        39.200 cm/s LVOT VTI:          0.106 m LVOT/AV VTI ratio: 0.26  SHUNTS Systemic VTI:  0.11 m Systemic Diam: 2.60 cm Joetta Mustache Electronically signed by Joetta Mustache Signature Date/Time: 10/07/2023/2:00:53 PM    Final    DG Chest Port 1 View Result Date:  10/07/2023 CLINICAL DATA:  Weakness and fever.  Pleural effusion. EXAM: PORTABLE CHEST 1 VIEW COMPARISON:  CT chest, abdomen, and pelvis dated 10/06/2023. Chest radiograph dated 09/30/2023. FINDINGS: The heart size and mediastinal contours are unchanged. Aortic atherosclerosis. Large cavitary lesion is again noted in the right lung with surrounding airspace opacities. Similar small bilateral pleural effusions with associated bibasilar atelectasis. Unchanged large hiatal hernia. No pneumothorax. No acute osseous abnormality. IMPRESSION: 1. Large cavitary lesion is again noted in the right lung with surrounding airspace opacities, concerning for cavitary pneumonia, better evaluated on the prior CT chest, abdomen, and pelvis  dated 10/06/2023. 2. Similar small bilateral pleural effusions with associated bibasilar atelectasis. Electronically Signed   By: Mannie Seek M.D.   On: 10/07/2023 12:42   CT CHEST ABDOMEN PELVIS W CONTRAST Result Date: 10/06/2023 CLINICAL DATA:  Worsening leukocytosis and transaminitis despite antibiotic treatment. Unintentional weight loss. Known cavitary pneumonia. Clinical concern for possible abscess or malignancy. EXAM: CT CHEST, ABDOMEN, AND PELVIS WITH CONTRAST TECHNIQUE: Multidetector CT imaging of the chest, abdomen and pelvis was performed following the standard protocol during bolus administration of intravenous contrast. RADIATION DOSE REDUCTION: This exam was performed according to the departmental dose-optimization program which includes automated exposure control, adjustment of the mA and/or kV according to patient size and/or use of iterative reconstruction technique. CONTRAST:  80mL OMNIPAQUE  IOHEXOL  300 MG/ML  SOLN COMPARISON:  Chest CT dated 10/01/2023. Abdomen and pelvis CT dated 08/08/2023. FINDINGS: CT CHEST FINDINGS Cardiovascular: There is a moderate-sized pulmonary embolus in the distal left main pulmonary artery and extending into the proximal left upper lobe  pulmonary artery without occlusion. A smaller embolus is demonstrated in a right lower lobe pulmonary artery branch. These are not sufficient to cause right heart strain. The previously demonstrated 4.5 cm ascending thoracic aorta aneurysm currently measures 4.3 cm. Enlarged central pulmonary arteries with a main pulmonary artery measuring 3.6 cm in diameter. Enlarged heart. Atheromatous calcifications, including the coronary arteries and aorta. Slight increase in size of a small pericardial effusion with a maximum thickness of 8 mm. Mediastinum/Nodes: No enlarged mediastinal, hilar, or axillary lymph nodes. Thyroid  gland, trachea, and esophagus demonstrate no significant findings. Again demonstrated is a large hiatal hernia with an intrathoracic stomach. Lungs/Pleura: Increased size and confluence of the large cavitary lesion previously demonstrated in the right upper lobe with a moderately thick, irregular peripheral rind and adjacent airspace opacity. This continues to contain some fluid, with mild improvement. This area currently measures 13.2 x 8.2 cm on image number 94/4, previously 8.6 x 6.5 cm in corresponding dimensions. Continued patchy airspace opacity in the adjacent portions of the right upper lobe. These areas have increased some in size and decreased some in density. The previously demonstrated right middle lobe nodular density has cavitated with 2 adjacent areas of cavitation and patchy opacity currently demonstrated in that area. Small to moderate-sized bilateral pleural effusions, increased on the right and without significant change on the left. Mild bilateral lower lobe atelectasis. Musculoskeletal: Mild thoracic spine degenerative changes. No evidence of bony metastatic disease. CT ABDOMEN PELVIS FINDINGS Hepatobiliary: No focal liver abnormality is seen. No gallstones, gallbladder wall thickening, or biliary dilatation. Pancreas: Unremarkable. No pancreatic ductal dilatation or surrounding  inflammatory changes. Spleen: Normal in size without focal abnormality. Adrenals/Urinary Tract: Moderate dilatation of the left renal collecting system and ureter with mild progression mild dilatation of the right renal collecting system and ureter with mild improvement. Again demonstrated is asymmetrical anterior bladder wall thickening, thicker on the right, with improvement. A suprapubic bladder catheter is currently demonstrated. Stable fat density left adrenal myelolipoma and probable lipid poor left adrenal adenoma. Unremarkable right adrenal gland. Stomach/Bowel: Extensive sigmoid and descending colon diverticulosis without evidence of diverticulitis. The appendix is not visualized. Again noted is the entire stomach within a large hiatal hernia. Unremarkable small bowel. Vascular/Lymphatic: Atheromatous arterial calcifications without aneurysm. No enlarged lymph nodes. Reproductive: Mildly enlarged and heterogeneous prostate gland with coarse calcifications. Other: No abdominal wall hernia or abnormality. No abdominopelvic ascites. Musculoskeletal: Moderate lumbar spine degenerative changes and mild scoliosis. IMPRESSION: 1. Bilateral pulmonary emboli, as described above.  These are not sufficient to cause right heart strain. 2. Increased size and confluence of the large cavitary lesion previously demonstrated in the right upper lobe with a moderately thick, irregular peripheral rind and adjacent airspace opacity. This continues to contain some fluid, with mild improvement. This is most likely due to a cavitary pneumonia. A cavitary neoplasm is less likely since it was not present on the abdomen and pelvis CT obtained on 08/08/2023. 3. Continued patchy airspace opacity in the adjacent portions of the right upper lobe. These areas have increased some in size and decreased some in density, compatible with ongoing pneumonia. 4. The previously demonstrated right middle lobe nodular density has cavitated with 2  adjacent areas of cavitation and patchy opacity currently demonstrated in that area, also compatible with cavitary pneumonia. 5. Small to moderate-sized bilateral pleural effusions, increased on the right and without significant change on the left. 6. Mild bilateral lower lobe atelectasis. 7. Slight increase in size of a small pericardial effusion. 8. 4.3 cm ascending thoracic aorta aneurysm. Recommend annual imaging followup by CTA or MRA. This recommendation follows 2010 ACCF/AHA/AATS/ACR/ASA/SCA/SCAI/SIR/STS/SVM Guidelines for the Diagnosis and Management of Patients with Thoracic Aortic Disease. Circulation. 2010; 121: Z610-R604. Aortic aneurysm NOS (ICD10-I71.9) 9. Enlarged central pulmonary arteries, suggesting pulmonary arterial hypertension. 10. Cardiomegaly. 11. Large hiatal hernia with an intrathoracic stomach. 12. Moderate dilatation of the left renal collecting system and ureter with mild progression. Mild dilatation of the right renal collecting system and ureter with mild improvement. This remains compatible with bilateral ureteral obstruction at the level of the ureterovesical junction 13. Asymmetrical anterior bladder wall thickening, thicker on the right, with improvement. A suprapubic bladder catheter is currently demonstrated. 14. Stable left adrenal myelolipoma and probable lipid poor adenoma. 15. Extensive sigmoid and descending colon diverticulosis without evidence of diverticulitis. 16. Mildly enlarged and heterogeneous prostate gland. 17.  Calcific coronary artery and aortic atherosclerosis. Aortic Atherosclerosis (ICD10-I70.0). Critical Value/emergent results were called by telephone at the time of interpretation on 10/06/2023 at 2:14 pm to provider Melodi Sprung , who verbally acknowledged these results. Electronically Signed   By: Catherin Closs M.D.   On: 10/06/2023 14:32      Assessment/Plan: MRSA bacteremia Likely source MRSA cavitary pneumonia Patient was on vancomycin  and then  changed to linezolid  and now back on vancomycin . Repeat blood culture no growth TEE no vegetations  Cavitary pneumonia on the right side with emphysema MRSA nares positive. Seen by pulmonary and they want to rule out tuberculosis Patient has to be in airborne isolation to collect AFB   Worsening leucocytosis  Abnormal LFTS.  Increasing transaminases and alkaline phosphatase  Anemia  Weight loss of 40 pounds  History of urinary retention has suprapubic catheter ESBL E. coli and Enterococcus in urine culture.  On Opana History of obstructive uropathy Bladder diverticulum  AKI on CKD Observe closely as he is on vancomycin  If it increases we will have to switch  back to linezolid .    Discussed the management with the patient and the care team

## 2023-10-07 NOTE — Telephone Encounter (Signed)
 Patient Product/process development scientist completed.    The patient is insured through [employer]. Patient has Medicare and is not eligible for a copay card, but may be able to apply for patient assistance or Medicare RX Payment Plan (Patient Must reach out to their plan, if eligible for payment plan), if available.    Ran test claim for Eliquis 5 mg and the current 30 day co-pay is $297.00 due to a $250.00 deductible.  Will be $47.00 once deductible is met.  Ran test claim for Xarelto 20 mg and the current 30 day co-pay is $297.00 due to a $250.00 deductible.  Will be $47.00 once deductible is met.  This test claim was processed through [facility]- copay amounts may vary at other pharmacies due to pharmacy/plan contracts, or as the patient moves through the different stages of their insurance plan.     Roland Earl, CPHT Pharmacy Technician III Certified Patient Advocate [facility] Patient Advocate Team Direct Number: [phone]  Fax: [phone]

## 2023-10-07 NOTE — Progress Notes (Signed)
 PROGRESS NOTE    [name]   [name] DOB: Dec 26, 1940  DOA: 09/30/2023 Date of Service: 10/07/23 which is hospital day 7  PCP: [name], MD    Hospital course / significant events:   HPI: Zachary [name] is a 83 y.o. male with medical history significant of HTN, urinary retention s/p suprapubic catheter placement,  HLD. Presents to ED from home via EMS w/ weakness.   Of note, hospitalized last month 03/04-03/07/25 w/ substantial urinary retention and AKI, Foley placed, f/u urology and suprapubic cath was placed 08/26/23. Also notable, has been following w/ PCP last visit 09/24/23 concern for unintentional weight loss, SPEP --> pending. Has been following w/ hematology last visit 09/26/23 for anemia and lung nodule, flow cytometry --> neg, CT chest --> pending   04/29: admitted to hospitalist service w/ sepsis, pna, uti, encephalopathy. ICU consult but he did not need pressors 04/30: await cultures, CT chest to eval PNA/other RML. Alert today, baseline mentation.  05/01: CT (+)cavitary pneumonia. 1 blood culture (+)MRSA.  Patient was requesting to leave AMA, see progress note and IPAL note.  Will stay at least through tonight.  ID considering for p.o. antibiotics in case patient is wanting to go home tomorrow 05/02: pt has reconsidered for treatment, he states he "made a mistake" yesterday and is now open to continuing IV antibiotics and going ot rehab hopefully same facility as his wife will be going to post-op. Palliative to follow either way.  05/03-05/04: repeat BCx negative thus far, continue current IV abx, placement to follow after the weekend  05/05: liver enzymes continue to elevate as well as alk phos, WBC also worsening, pt reports more fatigued today, coughing more toady/yesterday, but no other symptoms to explain findings, CT chest/abd/pelv --> d/w radiologist, (+)PE nonocclusive and no apparent RV strain, worsening cavitary lung lesion w/u as below and  pulm consult, consult w/ cardio re: TEE vs TTE given bacteremia and now PE, started heparin  and dc lovenox . Spoke w/ CT surgery, not a candidate, they recommend pulm to consider for bronch if able.  05/06: TEE - no vegetation, TTE pending to fully eval EF. Continuing IV abx and await cultures.     Consultants:  PCCM Infectious disease Palliative care  Cardiology  Informal discussion w/ CT surgery   Procedures/Surgeries: 10/07/23 TEE       ASSESSMENT & PLAN:   Sepsis POA Worsening leukocytosis likely d/t infection Treating underlying infections as below   Cavitary pneumonia RML Likely source for MRSA bacteremia Concern for necrotizing pneumonia  Less likely malignancy but unable to rule this out  Cavitary lesion appears worse on CT 10/06/23  Infectious disease following  Vancomycin   Pulmonary following - considering for bronch IF unable to obtain induced sputum for micro diagnosis Quantiferon TB testing, Urine strep and legionella, sputum culture ordered, acid fast, fungal cx Per CT surgery, not a candidate for any intervention on their end will need follow up CT chest in 4 weeks to determine need for biopsy.   R pleural effusion, small  Pulm performed bedside ultrasound 05/05 serially followed by pulmonary chest tube if needed, defer for now  MRSA bacteremia Infectious Disease following  Vancomycin  Monitor repeat cultures --> neg thus far  Pending TEE  ESBL UTI Suprapubic catheter d/t retention, cath was exchanged early admission S/p meropenem  Urology to follow outpatient   Pulmonary Embolism dx 10/06/23  heparin  IV drip No evidence on CT for RV strain per radiology, will get Echo (TEE per cardiology)  Goals of care / Advanced care planning Palliative care consult to facilitate GOC discussion  In my judgment, patient has capacity to voice his goals for quality of life 10/06/23, pt states he wants us  to do "whatever it takes to make me well," but maintain  DNR if decompensates to arrest   Elevated transaminase AST, ALP, ALP were trending up yesterday but improving today GGT slightly elevated, pointing to hepatobiliary over bone source Bilirubin normal  Elevation is not so severe to give suspicion for ischemia, no symptoms to give suspicion for GB disease or biliary obstruction CT chest/abd/pelvis - nothing to explain transaminitis  CK neg for rhabdo Hold hepatotoxic medications as able  Trend hepatic panel, bilirubin, GGT tomorrow  Consider for IgG, ANA, ASMA question autoimmune hepatitis if worsening again   Elevated troponin likely demand ischemis in setting of sepsis, less likely NSTEMI (non-ST elevated myocardial infarction) - on admission Secondary demand ischemia d/t sepsis Poor renal clearance also confounding issue Deferred ASA in setting of hematuria, now on heparin  Monitor   CKD (chronic kidney disease) stage 4, GFR 15-29 ml/min  Creatinine 2.4 on presentation with GFR in the 20s Appears to be near baseline Monitor   Acute encephalopathy POA - resolved  Positive generalized lethargy in setting of active sepsis with recurrent multifocal pneumonia and UTI CT head within normal  Monitor   Urinary retention s/p SP cath  History of ESBL E coli infection based on culture 03/12 UA indicative of infection POA 08/2023 Urine culture showing ESBL E coli  Abx as above per ID Suprapubic catheter exchanged in the ER  Follow cultures Keep cath site clean/dry    Essential hypertension Hold BP regimen in setting hypotension assd with sepsis  Monitor     Mixed hyperlipidemia hold lipitor          underweight based on BMI: Body mass index is 16.05 kg/m.[name] Significantly low or high BMI is associated with higher medical risk.  Underweight - under 18  overweight - 25 to 29 obese - 30 or more Class 1 obesity: BMI of 30.0 to 34 Class 2 obesity: BMI of 35.0 to 39 Class 3 obesity: BMI of 40.0 to 49 Super Morbid Obesity: BMI  50-59 Super-super Morbid Obesity: BMI 60+ Healthy nutrition and physical activity advised as adjunct to other disease management and risk reduction treatments    DVT prophylaxis: lovenox   IV fluids: none at this time  Nutrition: per dietician Central lines / other devices: suprapubic cath  Code Status: DNR ACP documentation reviewed:  none on file in [facility]  [facility] needs: SNF rehab Medical barriers to dispo: sepsis/infection, ID results, await clinical improvement. Now needing TEE and possibly bronchoscopy. Uncertain when will be medically ready or discharge             Subjective / Brief ROS:  Patient has no concerns today  Denies pain, fever Reports appetite is ok    Family Communication: I offered call to family 10/07/23, patient states he does not want me to call anyone      Objective Findings:  Vitals:   10/07/23 1305 10/07/23 1311 10/07/23 1315 10/07/23 1330  BP: (!) 95/56 93/81 (!) 87/64 99/62  Pulse:  63 (!) 55 67  Resp:  19 (!) 22 17  Temp:      TempSrc:      SpO2:  100% 100% 99%  Weight:      Height:        Intake/Output Summary (Last 24 hours) at 10/07/2023  1358 Last data filed at 10/07/2023 0847 Gross per 24 hour  Intake 668.2 ml  Output 1150 ml  Net -481.8 ml   Filed Weights   09/30/23 0457  Weight: 56.7 kg    Examination:  Physical Exam Constitutional:      General: He is not in acute distress.    Appearance: He is not ill-appearing (frail, thin) or toxic-appearing.  Cardiovascular:     Rate and Rhythm: Normal rate and regular rhythm.  Pulmonary:     Effort: Pulmonary effort is normal.     Breath sounds: Normal breath sounds.  Abdominal:     General: Abdomen is flat. Bowel sounds are normal. There is no distension.     Palpations: Abdomen is soft.     Tenderness: There is no abdominal tenderness.  Musculoskeletal:     Right lower leg: No edema.     Left lower leg: No edema.  Skin:    General: Skin is warm and dry.     Comments:  Appears normal around suprapubic cath site   Neurological:     General: No focal deficit present.     Mental Status: He is alert and oriented to person, place, and time. Mental status is at baseline.  Psychiatric:        Mood and Affect: Mood normal.        Behavior: Behavior normal.          Scheduled Medications:   [MAR Hold] Chlorhexidine  Gluconate Cloth  6 each Topical Q0600   [MAR Hold] feeding supplement  237 mL Oral TID BM   heparin   900 Units Intravenous Once   lidocaine        [MAR Hold] multivitamin with minerals  1 tablet Oral Daily   phenylephrine        Continuous Infusions:  sodium chloride  20 mL/hr at 10/07/23 1224   heparin      [MAR Hold] meropenem  (MERREM ) IV 1 g (10/07/23 0955)   [MAR Hold] vancomycin  Stopped (10/06/23 1749)     PRN Medications:  [MAR Hold] albuterol , [MAR Hold] guaiFENesin-dextromethorphan, lidocaine , [MAR Hold] ondansetron  **OR** [MAR Hold] ondansetron  (ZOFRAN ) IV, phenylephrine, [MAR Hold] sodium chloride  HYPERTONIC  Antimicrobials from admission:  Anti-infectives (From admission, onward)    Start     Dose/Rate Route Frequency Ordered Stop   10/06/23 1800  [MAR Hold]  meropenem  (MERREM ) 1 g in sodium chloride  0.9 % 100 mL IVPB        (MAR Hold since Tue 10/07/2023 at 1209.Hold Reason: Transfer to a Procedural area)   1 g 200 mL/hr over 30 Minutes Intravenous Every 12 hours 10/06/23 1548     10/06/23 1400  [MAR Hold]  vancomycin  (VANCOREADY) IVPB 750 mg/150 mL        (MAR Hold since Tue 10/07/2023 at 1209.Hold Reason: Transfer to a Procedural area)   750 mg 150 mL/hr over 60 Minutes Intravenous Every 24 hours 10/06/23 1135     10/04/23 1400  vancomycin  (VANCOREADY) IVPB 500 mg/100 mL  Status:  Discontinued        500 mg 100 mL/hr over 60 Minutes Intravenous Every 24 hours 10/03/23 1624 10/06/23 1135   10/03/23 1800  vancomycin  (VANCOREADY) IVPB 500 mg/100 mL        500 mg 100 mL/hr over 60 Minutes Intravenous  Once 10/03/23 1624  10/03/23 1859   10/02/23 2200  linezolid  (ZYVOX ) tablet 600 mg  Status:  Discontinued        600 mg Oral Every 12 hours 10/02/23  1550 10/03/23 1501   10/01/23 0900  vancomycin  (VANCOCIN ) IVPB 1000 mg/200 mL premix  Status:  Discontinued        1,000 mg 200 mL/hr over 60 Minutes Intravenous Every 48 hours 10/01/23 0736 10/02/23 1550   10/01/23 0800  vancomycin  variable dose per unstable renal function (pharmacist dosing)  Status:  Discontinued         Does not apply See admin instructions 09/30/23 0747 10/01/23 0738   09/30/23 1000  meropenem  (MERREM ) 500 mg in sodium chloride  0.9 % 100 mL IVPB  Status:  Discontinued        500 mg 200 mL/hr over 30 Minutes Intravenous Every 12 hours 09/30/23 0728 10/02/23 1550   09/30/23 0500  ceFEPIme  (MAXIPIME ) 2 g in sodium chloride  0.9 % 100 mL IVPB        2 g 200 mL/hr over 30 Minutes Intravenous  Once 09/30/23 0458 09/30/23 0608   09/30/23 0500  metroNIDAZOLE  (FLAGYL ) IVPB 500 mg        500 mg 100 mL/hr over 60 Minutes Intravenous  Once 09/30/23 0458 09/30/23 0826   09/30/23 0500  vancomycin  (VANCOCIN ) IVPB 1000 mg/200 mL premix        1,000 mg 200 mL/hr over 60 Minutes Intravenous  Once 09/30/23 0458 09/30/23 1478           Data Reviewed:  I have personally reviewed the following...  CBC: Recent Labs  Lab 10/01/23 0429 10/04/23 0432 10/06/23 0434 10/07/23 0316  WBC 8.1 15.1* 19.8* 20.8*  NEUTROABS  --  10.9*  --   --   HGB 8.5* 9.7* 9.2* 9.0*  HCT 25.2* 29.3* 27.6* 26.8*  MCV 98.1 97.3 98.6 97.5  PLT 184 281 338 350   Basic Metabolic Panel: Recent Labs  Lab 10/01/23 0429 10/04/23 0432 10/05/23 0424 10/06/23 0434 10/07/23 0316  NA 132* 134*  --  137 132*  K 3.9 3.9  --  4.2 4.3  CL 104 102  --  106 102  CO2 18* 21*  --  22 21*  GLUCOSE 95 121*  --  113* 101*  BUN 59* 59*  --  54* 53*  CREATININE 2.05* 1.70* 1.60* 1.54* 1.43*  CALCIUM  8.2* 8.1*  --  8.1* 8.0*   GFR: Estimated Creatinine Clearance: 33 mL/min (A) (by  C-G formula based on SCr of 1.43 mg/dL (H)). Liver Function Tests: Recent Labs  Lab 10/01/23 0429 10/04/23 0432 10/06/23 0434 10/07/23 0316  AST 26 78* 130* 94*  ALT 22 72* 152* 135*  ALKPHOS 80 152* 223* 219*  BILITOT 0.7 0.6 0.8 0.8  PROT 4.6* 4.8* 4.7* 4.9*  ALBUMIN 1.7* 1.7* 1.6* 1.7*   No results for input(s): "LIPASE", "AMYLASE" in the last 168 hours. No results for input(s): "AMMONIA" in the last 168 hours. Coagulation Profile: Recent Labs  Lab 10/06/23 1532  INR 1.2   Cardiac Enzymes: Recent Labs  Lab 10/06/23 1307  CKTOTAL 13*   BNP (last 3 results) No results for input(s): "PROBNP" in the last 8760 hours. HbA1C: No results for input(s): "HGBA1C" in the last 72 hours. CBG: No results for input(s): "GLUCAP" in the last 168 hours. Lipid Profile: No results for input(s): "CHOL", "HDL", "LDLCALC", "TRIG", "CHOLHDL", "LDLDIRECT" in the last 72 hours. Thyroid  Function Tests: No results for input(s): "TSH", "T4TOTAL", "FREET4", "T3FREE", "THYROIDAB" in the last 72 hours. Anemia Panel: No results for input(s): "VITAMINB12", "FOLATE", "FERRITIN", "TIBC", "IRON", "RETICCTPCT" in the last 72 hours. Most Recent Urinalysis On File:  Component Value Date/Time   COLORURINE YELLOW (A) 09/30/2023 0557   APPEARANCEUR CLOUDY (A) 09/30/2023 0557   APPEARANCEUR Hazy (A) 08/12/2023 1141   LABSPEC 1.015 09/30/2023 0557   PHURINE 5.0 09/30/2023 0557   GLUCOSEU NEGATIVE 09/30/2023 0557   HGBUR SMALL (A) 09/30/2023 0557   BILIRUBINUR NEGATIVE 09/30/2023 0557   BILIRUBINUR Negative 08/12/2023 1141   KETONESUR NEGATIVE 09/30/2023 0557   PROTEINUR 30 (A) 09/30/2023 0557   UROBILINOGEN 1.0 06/30/2019 0823   NITRITE NEGATIVE 09/30/2023 0557   LEUKOCYTESUR LARGE (A) 09/30/2023 0557   Sepsis Labs: @LABRCNTIP (procalcitonin:4,lacticidven:4) Microbiology: Recent Results (from the past 240 hours)  Resp panel by RT-PCR (RSV, Flu A&B, Covid) Anterior Nasal Swab     Status: None    Collection Time: 09/30/23  5:06 AM   Specimen: Anterior Nasal Swab  Result Value Ref Range Status   SARS Coronavirus 2 by RT PCR NEGATIVE NEGATIVE Final    Comment: (NOTE) SARS-CoV-2 target nucleic acids are NOT DETECTED.  The SARS-CoV-2 RNA is generally detectable in upper respiratory specimens during the acute phase of infection. The lowest concentration of SARS-CoV-2 viral copies this assay can detect is 138 copies/mL. A negative result does not preclude SARS-Cov-2 infection and should not be used as the sole basis for treatment or other patient management decisions. A negative result may occur with  improper specimen collection/handling, submission of specimen other than nasopharyngeal swab, presence of viral mutation(s) within the areas targeted by this assay, and inadequate number of viral copies(<138 copies/mL). A negative result must be combined with clinical observations, patient history, and epidemiological information. The expected result is Negative.  Fact Sheet for Patients:  BloggerCourse.com  Fact Sheet for Healthcare Providers:  SeriousBroker.it  This test is no t yet approved or cleared by the [place]  FDA and  has been authorized for detection and/or diagnosis of SARS-CoV-2 by FDA under an Emergency Use Authorization (EUA). This EUA will remain  in effect (meaning this test can be used) for the duration of the COVID-19 declaration under Section 564(b)(1) of the Act, 21 U.S.C.section 360bbb-3(b)(1), unless the authorization is terminated  or revoked sooner.       Influenza A by PCR NEGATIVE NEGATIVE Final   Influenza B by PCR NEGATIVE NEGATIVE Final    Comment: (NOTE) The Xpert Xpress SARS-CoV-2/FLU/RSV plus assay is intended as an aid in the diagnosis of influenza from Nasopharyngeal swab specimens and should not be used as a sole basis for treatment. Nasal washings and aspirates are unacceptable for  Xpert Xpress SARS-CoV-2/FLU/RSV testing.  Fact Sheet for Patients: BloggerCourse.com  Fact Sheet for Healthcare Providers: SeriousBroker.it  This test is not yet approved or cleared by the [place]  FDA and has been authorized for detection and/or diagnosis of SARS-CoV-2 by FDA under an Emergency Use Authorization (EUA). This EUA will remain in effect (meaning this test can be used) for the duration of the COVID-19 declaration under Section 564(b)(1) of the Act, 21 U.S.C. section 360bbb-3(b)(1), unless the authorization is terminated or revoked.     Resp Syncytial Virus by PCR NEGATIVE NEGATIVE Final    Comment: (NOTE) Fact Sheet for Patients: BloggerCourse.com  Fact Sheet for Healthcare Providers: SeriousBroker.it  This test is not yet approved or cleared by the [place]  FDA and has been authorized for detection and/or diagnosis of SARS-CoV-2 by FDA under an Emergency Use Authorization (EUA). This EUA will remain in effect (meaning this test can be used) for the duration of the COVID-19 declaration under Section 564(b)(1) of the  Act, 21 U.S.C. section 360bbb-3(b)(1), unless the authorization is terminated or revoked.  Performed at [facility], [address], [place], [place] [address]   Blood Culture (routine x 2)     Status: Abnormal   Collection Time: 09/30/23  5:06 AM   Specimen: Left Antecubital; Blood  Result Value Ref Range Status   Specimen Description   Final    LEFT ANTECUBITAL Performed at [facility], [address] [place], [place] [address]    Special Requests   Final    BOTTLES DRAWN AEROBIC AND ANAEROBIC Blood Culture results may not be optimal due to an inadequate volume of blood received in culture bottles Performed at [facility], [address] [place], [place] [address]    Culture  Setup Time   Final    GRAM  POSITIVE COCCI AEROBIC BOTTLE ONLY CRITICAL RESULT CALLED TO, READ BACK BY AND VERIFIED WITH:  NATHAN BELUE AT 0401 10/02/23 JG Performed at [facility], [address] [place], [place] [address]    Culture METHICILLIN RESISTANT STAPHYLOCOCCUS AUREUS (A)  Final   Report Status 10/04/2023 FINAL  Final   Organism ID, Bacteria METHICILLIN RESISTANT STAPHYLOCOCCUS AUREUS  Final      Susceptibility   Methicillin resistant staphylococcus aureus - MIC*    CIPROFLOXACIN  >=8 RESISTANT Resistant     ERYTHROMYCIN >=8 RESISTANT Resistant     GENTAMICIN <=0.5 SENSITIVE Sensitive     OXACILLIN >=4 RESISTANT Resistant     TETRACYCLINE <=1 SENSITIVE Sensitive     VANCOMYCIN  1 SENSITIVE Sensitive     TRIMETH /SULFA  <=10 SENSITIVE Sensitive     CLINDAMYCIN <=0.25 SENSITIVE Sensitive     RIFAMPIN <=0.5 SENSITIVE Sensitive     Inducible Clindamycin NEGATIVE Sensitive     LINEZOLID  2 SENSITIVE Sensitive     * METHICILLIN RESISTANT STAPHYLOCOCCUS AUREUS  Blood Culture (routine x 2)     Status: None   Collection Time: 09/30/23  5:06 AM   Specimen: BLOOD  Result Value Ref Range Status   Specimen Description BLOOD RIGTH [facility]  Final   Special Requests   Final    BOTTLES DRAWN AEROBIC AND ANAEROBIC Blood Culture adequate volume   Culture   Final    NO GROWTH 5 DAYS Performed at [facility], [address] [place], [place] [address]    Report Status 10/05/2023 FINAL  Final  Blood Culture ID Panel (Reflexed)     Status: Abnormal   Collection Time: 09/30/23  5:06 AM  Result Value Ref Range Status   Enterococcus faecalis NOT DETECTED NOT DETECTED Final   Enterococcus Faecium NOT DETECTED NOT DETECTED Final   Listeria monocytogenes NOT DETECTED NOT DETECTED Final   Staphylococcus species DETECTED (A) NOT DETECTED Final    Comment: CRITICAL RESULT CALLED TO, READ BACK BY AND VERIFIED WITH:  NATHAN BELUE AT 0401 10/02/23 JG    Staphylococcus aureus (BCID) DETECTED (A) NOT DETECTED Final     Comment: Methicillin (oxacillin)-resistant Staphylococcus aureus (MRSA). MRSA is predictably resistant to beta-lactam antibiotics (except ceftaroline). Preferred therapy is vancomycin  unless clinically contraindicated. Patient requires contact precautions if  hospitalized. CRITICAL RESULT CALLED TO, READ BACK BY AND VERIFIED WITH:  NATHAN BELUE AT 0401 10/02/23 JG    Staphylococcus epidermidis NOT DETECTED NOT DETECTED Final   Staphylococcus lugdunensis NOT DETECTED NOT DETECTED Final   Streptococcus species NOT DETECTED NOT DETECTED Final   Streptococcus agalactiae NOT DETECTED NOT DETECTED Final   Streptococcus pneumoniae NOT DETECTED NOT DETECTED  Final   Streptococcus pyogenes NOT DETECTED NOT DETECTED Final   A.calcoaceticus-baumannii NOT DETECTED NOT DETECTED Final   Bacteroides fragilis NOT DETECTED NOT DETECTED Final   Enterobacterales NOT DETECTED NOT DETECTED Final   Enterobacter cloacae complex NOT DETECTED NOT DETECTED Final   Escherichia coli NOT DETECTED NOT DETECTED Final   Klebsiella aerogenes NOT DETECTED NOT DETECTED Final   Klebsiella oxytoca NOT DETECTED NOT DETECTED Final   Klebsiella pneumoniae NOT DETECTED NOT DETECTED Final   Proteus species NOT DETECTED NOT DETECTED Final   Salmonella species NOT DETECTED NOT DETECTED Final   Serratia marcescens NOT DETECTED NOT DETECTED Final   Haemophilus influenzae NOT DETECTED NOT DETECTED Final   Neisseria meningitidis NOT DETECTED NOT DETECTED Final   Pseudomonas aeruginosa NOT DETECTED NOT DETECTED Final   Stenotrophomonas maltophilia NOT DETECTED NOT DETECTED Final   Candida albicans NOT DETECTED NOT DETECTED Final   Candida auris NOT DETECTED NOT DETECTED Final   Candida glabrata NOT DETECTED NOT DETECTED Final   Candida krusei NOT DETECTED NOT DETECTED Final   Candida parapsilosis NOT DETECTED NOT DETECTED Final   Candida tropicalis NOT DETECTED NOT DETECTED Final   Cryptococcus neoformans/gattii NOT DETECTED NOT  DETECTED Final   Meth resistant mecA/C and MREJ DETECTED (A) NOT DETECTED Final    Comment: CRITICAL RESULT CALLED TO, READ BACK BY AND VERIFIED WITHNoe Bath AT 0401 10/02/23 JG Performed at [facility], [address], [place], [place] [address]   Urine Culture     Status: Abnormal   Collection Time: 09/30/23  5:57 AM   Specimen: Urine, Suprapubic  Result Value Ref Range Status   Specimen Description   Final    URINE, SUPRAPUBIC Performed at [facility], [address], [place], [place] [address]    Special Requests   Final    NONE Reflexed from [id number] Performed at [facility], [address] [place], [place] [address]    Culture (A)  Final    >=100,000 COLONIES/mL ESCHERICHIA COLI Confirmed Extended Spectrum Beta-Lactamase Producer (ESBL).  In bloodstream infections from ESBL organisms, carbapenems are preferred over piperacillin/tazobactam. They are shown to have a lower risk of mortality. >=100,000 COLONIES/mL ENTEROCOCCUS FAECALIS    Report Status 10/02/2023 FINAL  Final   Organism ID, Bacteria ESCHERICHIA COLI (A)  Final   Organism ID, Bacteria ENTEROCOCCUS FAECALIS (A)  Final      Susceptibility   Escherichia coli - MIC*    AMPICILLIN  >=32 RESISTANT Resistant     CEFAZOLIN >=64 RESISTANT Resistant     CEFEPIME  8 INTERMEDIATE Intermediate     CEFTRIAXONE  >=64 RESISTANT Resistant     CIPROFLOXACIN  >=4 RESISTANT Resistant     GENTAMICIN <=1 SENSITIVE Sensitive     IMIPENEM 0.5 SENSITIVE Sensitive     NITROFURANTOIN <=16 SENSITIVE Sensitive     TRIMETH /SULFA  >=320 RESISTANT Resistant     AMPICILLIN /SULBACTAM 4 SENSITIVE Sensitive     PIP/TAZO <=4 SENSITIVE Sensitive ug/mL    * >=100,000 COLONIES/mL ESCHERICHIA COLI   Enterococcus faecalis - MIC*    AMPICILLIN  <=2 SENSITIVE Sensitive     NITROFURANTOIN <=16 SENSITIVE Sensitive     VANCOMYCIN  1 SENSITIVE Sensitive     * >=100,000 COLONIES/mL ENTEROCOCCUS FAECALIS  MRSA Next Gen by PCR,  Nasal     Status: Abnormal   Collection Time: 10/01/23 10:05 PM   Specimen: Nasal Mucosa; Nasal Swab  Result Value Ref Range Status   MRSA by PCR Next Gen DETECTED (A) NOT DETECTED  Final    Comment: RESULT CALLED TO, READ BACK BY AND VERIFIED WITH:  ERICKA KLARAS AT 0347 10/02/23 JG (NOTE) The GeneXpert MRSA Assay (FDA approved for NASAL specimens only), is one component of a comprehensive MRSA colonization surveillance program. It is not intended to diagnose MRSA infection nor to guide or monitor treatment for MRSA infections. Test performance is not FDA approved in patients less than 53 years old. Performed at [facility], [address] [place], [place] [address]   Culture, blood (Routine X 2) w Reflex to ID Panel     Status: None (Preliminary result)   Collection Time: 10/03/23 11:02 AM   Specimen: BLOOD  Result Value Ref Range Status   Specimen Description BLOOD RIGHT ANTECUBITAL  Final   Special Requests   Final    BOTTLES DRAWN AEROBIC AND ANAEROBIC Blood Culture adequate volume   Culture   Final    NO GROWTH 4 DAYS Performed at [facility], [address], [place], [place] [address]    Report Status PENDING  Incomplete  Culture, blood (Routine X 2) w Reflex to ID Panel     Status: None (Preliminary result)   Collection Time: 10/03/23 11:10 AM   Specimen: BLOOD  Result Value Ref Range Status   Specimen Description BLOOD BLOOD RIGHT FOREARM  Final   Special Requests   Final    BOTTLES DRAWN AEROBIC AND ANAEROBIC Blood Culture adequate volume   Culture   Final    NO GROWTH 4 DAYS Performed at [facility], [address], [place], [place] [address]    Report Status PENDING  Incomplete  Expectorated Sputum Assessment w Gram Stain, Rflx to Resp Cult     Status: None   Collection Time: 10/06/23  8:39 PM   Specimen: Sputum  Result Value Ref Range Status   Specimen Description SPUTUM  Final   Special Requests NONE  Final   Sputum evaluation    Final    THIS SPECIMEN IS ACCEPTABLE FOR SPUTUM CULTURE Performed at [facility], [address], [place], [place] [address]    Report Status 10/06/2023 FINAL  Final  Culture, Respiratory w Gram Stain     Status: None (Preliminary result)   Collection Time: 10/06/23  8:39 PM   Specimen: SPU  Result Value Ref Range Status   Specimen Description   Final    SPUTUM Performed at [facility], [address] [place], [place] [address]    Special Requests   Final    NONE Reflexed from [phone] Performed at [facility], [address] [place], [place] [address]    Gram Stain   Final    RARE WBC PRESENT, PREDOMINANTLY PMN FEW GRAM POSITIVE COCCI RARE BUDDING YEAST SEEN Performed at [facility], [address], [place], [place] [address]    Culture PENDING  Incomplete   Report Status PENDING  Incomplete      Radiology Studies last 3 days: DG Chest Port 1 View Result Date: 10/07/2023 CLINICAL DATA:  Weakness and fever.  Pleural effusion. EXAM: PORTABLE CHEST 1 VIEW COMPARISON:  CT chest, abdomen, and pelvis dated 10/06/2023. Chest radiograph dated 09/30/2023. FINDINGS: The heart size and mediastinal contours are unchanged. Aortic atherosclerosis. Large cavitary lesion is again noted in the right lung with surrounding airspace opacities. Similar small bilateral pleural effusions with associated bibasilar atelectasis. Unchanged large hiatal hernia. No pneumothorax. No acute osseous abnormality. IMPRESSION: 1. Large cavitary lesion is again noted in the  right lung with surrounding airspace opacities, concerning for cavitary pneumonia, better evaluated on the prior CT chest, abdomen, and pelvis dated 10/06/2023. 2. Similar small bilateral pleural effusions with associated bibasilar atelectasis. Electronically Signed   By: Mannie Seek M.D.   On: 10/07/2023 12:42   CT CHEST ABDOMEN PELVIS W CONTRAST Result Date: 10/06/2023 CLINICAL DATA:  Worsening  leukocytosis and transaminitis despite antibiotic treatment. Unintentional weight loss. Known cavitary pneumonia. Clinical concern for possible abscess or malignancy. EXAM: CT CHEST, ABDOMEN, AND PELVIS WITH CONTRAST TECHNIQUE: Multidetector CT imaging of the chest, abdomen and pelvis was performed following the standard protocol during bolus administration of intravenous contrast. RADIATION DOSE REDUCTION: This exam was performed according to the departmental dose-optimization program which includes automated exposure control, adjustment of the mA and/or kV according to patient size and/or use of iterative reconstruction technique. CONTRAST:  80mL OMNIPAQUE  IOHEXOL  300 MG/ML  SOLN COMPARISON:  Chest CT dated 10/01/2023. Abdomen and pelvis CT dated 08/08/2023. FINDINGS: CT CHEST FINDINGS Cardiovascular: There is a moderate-sized pulmonary embolus in the distal left main pulmonary artery and extending into the proximal left upper lobe pulmonary artery without occlusion. A smaller embolus is demonstrated in a right lower lobe pulmonary artery branch. These are not sufficient to cause right heart strain. The previously demonstrated 4.5 cm ascending thoracic aorta aneurysm currently measures 4.3 cm. Enlarged central pulmonary arteries with a main pulmonary artery measuring 3.6 cm in diameter. Enlarged heart. Atheromatous calcifications, including the coronary arteries and aorta. Slight increase in size of a small pericardial effusion with a maximum thickness of 8 mm. Mediastinum/Nodes: No enlarged mediastinal, hilar, or axillary lymph nodes. Thyroid  gland, trachea, and esophagus demonstrate no significant findings. Again demonstrated is a large hiatal hernia with an intrathoracic stomach. Lungs/Pleura: Increased size and confluence of the large cavitary lesion previously demonstrated in the right upper lobe with a moderately thick, irregular peripheral rind and adjacent airspace opacity. This continues to contain some  fluid, with mild improvement. This area currently measures 13.2 x 8.2 cm on image number 94/4, previously 8.6 x 6.5 cm in corresponding dimensions. Continued patchy airspace opacity in the adjacent portions of the right upper lobe. These areas have increased some in size and decreased some in density. The previously demonstrated right middle lobe nodular density has cavitated with 2 adjacent areas of cavitation and patchy opacity currently demonstrated in that area. Small to moderate-sized bilateral pleural effusions, increased on the right and without significant change on the left. Mild bilateral lower lobe atelectasis. Musculoskeletal: Mild thoracic spine degenerative changes. No evidence of bony metastatic disease. CT ABDOMEN PELVIS FINDINGS Hepatobiliary: No focal liver abnormality is seen. No gallstones, gallbladder wall thickening, or biliary dilatation. Pancreas: Unremarkable. No pancreatic ductal dilatation or surrounding inflammatory changes. Spleen: Normal in size without focal abnormality. Adrenals/Urinary Tract: Moderate dilatation of the left renal collecting system and ureter with mild progression mild dilatation of the right renal collecting system and ureter with mild improvement. Again demonstrated is asymmetrical anterior bladder wall thickening, thicker on the right, with improvement. A suprapubic bladder catheter is currently demonstrated. Stable fat density left adrenal myelolipoma and probable lipid poor left adrenal adenoma. Unremarkable right adrenal gland. Stomach/Bowel: Extensive sigmoid and descending colon diverticulosis without evidence of diverticulitis. The appendix is not visualized. Again noted is the entire stomach within a large hiatal hernia. Unremarkable small bowel. Vascular/Lymphatic: Atheromatous arterial calcifications without aneurysm. No enlarged lymph nodes. Reproductive: Mildly enlarged and heterogeneous prostate gland with coarse calcifications. Other: No abdominal wall  hernia or  abnormality. No abdominopelvic ascites. Musculoskeletal: Moderate lumbar spine degenerative changes and mild scoliosis. IMPRESSION: 1. Bilateral pulmonary emboli, as described above. These are not sufficient to cause right heart strain. 2. Increased size and confluence of the large cavitary lesion previously demonstrated in the right upper lobe with a moderately thick, irregular peripheral rind and adjacent airspace opacity. This continues to contain some fluid, with mild improvement. This is most likely due to a cavitary pneumonia. A cavitary neoplasm is less likely since it was not present on the abdomen and pelvis CT obtained on 08/08/2023. 3. Continued patchy airspace opacity in the adjacent portions of the right upper lobe. These areas have increased some in size and decreased some in density, compatible with ongoing pneumonia. 4. The previously demonstrated right middle lobe nodular density has cavitated with 2 adjacent areas of cavitation and patchy opacity currently demonstrated in that area, also compatible with cavitary pneumonia. 5. Small to moderate-sized bilateral pleural effusions, increased on the right and without significant change on the left. 6. Mild bilateral lower lobe atelectasis. 7. Slight increase in size of a small pericardial effusion. 8. 4.3 cm ascending thoracic aorta aneurysm. Recommend annual imaging followup by CTA or MRA. This recommendation follows 2010 ACCF/AHA/AATS/ACR/ASA/SCA/SCAI/SIR/STS/SVM Guidelines for the Diagnosis and Management of Patients with Thoracic Aortic Disease. Circulation. 2010; 121: Z610-R604. Aortic aneurysm NOS (ICD10-I71.9) 9. Enlarged central pulmonary arteries, suggesting pulmonary arterial hypertension. 10. Cardiomegaly. 11. Large hiatal hernia with an intrathoracic stomach. 12. Moderate dilatation of the left renal collecting system and ureter with mild progression. Mild dilatation of the right renal collecting system and ureter with mild  improvement. This remains compatible with bilateral ureteral obstruction at the level of the ureterovesical junction 13. Asymmetrical anterior bladder wall thickening, thicker on the right, with improvement. A suprapubic bladder catheter is currently demonstrated. 14. Stable left adrenal myelolipoma and probable lipid poor adenoma. 15. Extensive sigmoid and descending colon diverticulosis without evidence of diverticulitis. 16. Mildly enlarged and heterogeneous prostate gland. 17.  Calcific coronary artery and aortic atherosclerosis. Aortic Atherosclerosis (ICD10-I70.0). Critical Value/emergent results were called by telephone at the time of interpretation on 10/06/2023 at 2:14 pm to provider Melodi Sprung , who verbally acknowledged these results. Electronically Signed   By: Catherin Closs M.D.   On: 10/06/2023 14:32        Time spent: 50 min     Christia Domke, DO [facility] 10/07/2023, 1:58 PM    Dictation software may have been used to generate the above note. Typos may occur and escape review in typed/dictated notes. Please contact Dr Authur Leghorn directly for clarity if needed.  Staff may message me via secure chat in Epic  but this may not receive an immediate response,  please page me for urgent matters!  If 7PM-7AM, please contact night coverage [email or web address]

## 2023-10-07 NOTE — Anesthesia Preprocedure Evaluation (Signed)
 Anesthesia Evaluation  Patient identified by MRN, date of birth, ID band Patient awake    Reviewed: Allergy & Precautions, NPO status , Patient's Chart, lab work & pertinent test results  History of Anesthesia Complications Negative for: history of anesthetic complications  Airway Mallampati: III  TM Distance: <3 FB Neck ROM: full    Dental  (+) Chipped, Poor Dentition, Missing   Pulmonary pneumonia   Pulmonary exam normal        Cardiovascular Exercise Tolerance: Good hypertension, (-) angina + CAD and + Past MI  Normal cardiovascular exam     Neuro/Psych negative neurological ROS  negative psych ROS   GI/Hepatic negative GI ROS, Neg liver ROS,,,  Endo/Other  negative endocrine ROS    Renal/GU Renal disease  negative genitourinary   Musculoskeletal   Abdominal   Peds  Hematology negative hematology ROS (+)   Anesthesia Other Findings Past Medical History: 08/03/2019: B12 deficiency anemia No date: Hypercholesterolemia No date: Hypertension No date: Vertigo     Comment:  last episode approx 09/2018  Past Surgical History: No date: APPENDECTOMY No date: CATARACT EXTRACTION; Bilateral 12/24/2018: COLONOSCOPY WITH PROPOFOL ; N/A     Comment:  Procedure: COLONOSCOPY WITH PROPOFOL ;  Surgeon: Marnee Sink, MD;  Location: [facility];  Service:               Endoscopy;  Laterality: N/A; 12/13/2019: COLONOSCOPY WITH PROPOFOL ; N/A     Comment:  Procedure: COLONOSCOPY WITH BIOPSY ;  Surgeon: Marnee Sink, MD;  Location: [facility];  Service:               Endoscopy;  Laterality: N/A;  priority 3 08/26/2023: IR CYSTOSTOMY TUBE PLACEMENT/BLADDER ASPIRATION 05/2015: PLEURAL SCARIFICATION 12/13/2019: POLYPECTOMY; N/A     Comment:  Procedure: POLYPECTOMY;  Surgeon: Marnee Sink, MD;                Location: [facility];  Service: Endoscopy;                Laterality:  N/A;  BMI    Body Mass Index: 16.05 kg/m      Reproductive/Obstetrics negative OB ROS                             Anesthesia Physical Anesthesia Plan  ASA: 3  Anesthesia Plan: General   Post-op Pain Management:    Induction: Intravenous  PONV Risk Score and Plan: Propofol  infusion and TIVA  Airway Management Planned: Natural Airway and Nasal Cannula  Additional Equipment:   Intra-op Plan:   Post-operative Plan:   Informed Consent: I have reviewed the patients History and Physical, chart, labs and discussed the procedure including the risks, benefits and alternatives for the proposed anesthesia with the patient or authorized representative who has indicated his/her understanding and acceptance.     Dental Advisory Given  Plan Discussed with: Anesthesiologist, CRNA and Surgeon  Anesthesia Plan Comments: (Patient consented for risks of anesthesia including but not limited to:  - adverse reactions to medications - risk of airway placement if required - damage to eyes, teeth, lips or other oral mucosa - nerve damage due to positioning  - sore throat or hoarseness - Damage to heart, brain, nerves, lungs, other parts of body or loss of life  Patient voiced  understanding and assent.)       Anesthesia Quick Evaluation

## 2023-10-07 NOTE — Consult Note (Signed)
 PHARMACY - ANTICOAGULATION CONSULT NOTE  Pharmacy Consult for Heparin   Indication: pulmonary embolus  No Known Allergies  Patient Measurements: Height: 6\' 2"  (188 cm) Weight: 56.7 kg (125 lb) IBW/kg (Calculated) : 82.2 HEPARIN  DW (KG): 56.7  Vital Signs: Temp: 98.4 F (36.9 C) (05/06 0750) Temp Source: Oral (05/06 0750) BP: 106/61 (05/06 0750) Pulse Rate: 66 (05/06 0750)  Labs: Recent Labs    10/05/23 0424 10/06/23 0434 10/06/23 1307 10/06/23 1532 10/07/23 0006 10/07/23 0316 10/07/23 0903  HGB  --  9.2*  --   --   --  9.0*  --   HCT  --  27.6*  --   --   --  26.8*  --   PLT  --  338  --   --   --  350  --   APTT  --   --   --  40*  --   --   --   LABPROT  --   --   --  15.3*  --   --   --   INR  --   --   --  1.2  --   --   --   HEPARINUNFRC  --   --   --   --  0.18*  --  0.29*  CREATININE 1.60* 1.54*  --   --   --  1.43*  --   CKTOTAL  --   --  13*  --   --   --   --     Estimated Creatinine Clearance: 33 mL/min (A) (by C-G formula based on SCr of 1.43 mg/dL (H)).   Medical History: Past Medical History:  Diagnosis Date   B12 deficiency anemia 08/03/2019   Hypercholesterolemia    Hypertension    Vertigo    last episode approx 09/2018    Medications:  Medications Prior to Admission  Medication Sig Dispense Refill Last Dose/Taking   acetaminophen  (TYLENOL ) 325 MG tablet Take 2 tablets (650 mg total) by mouth every 6 (six) hours as needed for mild pain (pain score 1-3), headache or fever (or Fever >/= 101).   Past Week   atorvastatin  (LIPITOR) 10 MG tablet Take 10 mg by mouth daily.   09/29/2023   calcium  carbonate (OS-CAL - DOSED IN MG OF ELEMENTAL CALCIUM ) 1250 (500 Ca) MG tablet Take 1 tablet (1,250 mg total) by mouth 2 (two) times daily with a meal. 30 tablet 0 09/29/2023   Multiple Vitamin (MULTIVITAMIN WITH MINERALS) TABS tablet Take 1 tablet by mouth daily.   09/29/2023   triamterene -hydrochlorothiazide (DYAZIDE) 37.5-25 MG capsule Take 1 capsule by mouth  daily.   09/29/2023   [Paused] aspirin  EC 81 MG tablet Take 81 mg by mouth at bedtime. Pt states he has resumed as of 09/26/23      Scheduled:   Chlorhexidine  Gluconate Cloth  6 each Topical Q0600   feeding supplement  237 mL Oral TID BM   multivitamin with minerals  1 tablet Oral Daily   Infusions:   sodium chloride  20 mL/hr at 10/07/23 0616   heparin  1,100 Units/hr (10/07/23 0616)   meropenem  (MERREM ) IV 1 g (10/07/23 0955)   vancomycin  Stopped (10/06/23 1749)   PRN: albuterol , guaiFENesin-dextromethorphan, ondansetron  **OR** ondansetron  (ZOFRAN ) IV, sodium chloride  HYPERTONIC Anti-infectives (From admission, onward)    Start     Dose/Rate Route Frequency Ordered Stop   10/06/23 1800  meropenem  (MERREM ) 1 g in sodium chloride  0.9 % 100 mL IVPB  1 g 200 mL/hr over 30 Minutes Intravenous Every 12 hours 10/06/23 1548     10/06/23 1400  vancomycin  (VANCOREADY) IVPB 750 mg/150 mL        750 mg 150 mL/hr over 60 Minutes Intravenous Every 24 hours 10/06/23 1135     10/04/23 1400  vancomycin  (VANCOREADY) IVPB 500 mg/100 mL  Status:  Discontinued        500 mg 100 mL/hr over 60 Minutes Intravenous Every 24 hours 10/03/23 1624 10/06/23 1135   10/03/23 1800  vancomycin  (VANCOREADY) IVPB 500 mg/100 mL        500 mg 100 mL/hr over 60 Minutes Intravenous  Once 10/03/23 1624 10/03/23 1859   10/02/23 2200  linezolid  (ZYVOX ) tablet 600 mg  Status:  Discontinued        600 mg Oral Every 12 hours 10/02/23 1550 10/03/23 1501   10/01/23 0900  vancomycin  (VANCOCIN ) IVPB 1000 mg/200 mL premix  Status:  Discontinued        1,000 mg 200 mL/hr over 60 Minutes Intravenous Every 48 hours 10/01/23 0736 10/02/23 1550   10/01/23 0800  vancomycin  variable dose per unstable renal function (pharmacist dosing)  Status:  Discontinued         Does not apply See admin instructions 09/30/23 0747 10/01/23 0738   09/30/23 1000  meropenem  (MERREM ) 500 mg in sodium chloride  0.9 % 100 mL IVPB  Status:  Discontinued         500 mg 200 mL/hr over 30 Minutes Intravenous Every 12 hours 09/30/23 0728 10/02/23 1550   09/30/23 0500  ceFEPIme  (MAXIPIME ) 2 g in sodium chloride  0.9 % 100 mL IVPB        2 g 200 mL/hr over 30 Minutes Intravenous  Once 09/30/23 0458 09/30/23 0608   09/30/23 0500  metroNIDAZOLE  (FLAGYL ) IVPB 500 mg        500 mg 100 mL/hr over 60 Minutes Intravenous  Once 09/30/23 0458 09/30/23 0826   09/30/23 0500  vancomycin  (VANCOCIN ) IVPB 1000 mg/200 mL premix        1,000 mg 200 mL/hr over 60 Minutes Intravenous  Once 09/30/23 0458 09/30/23 6045       Assessment: 83 y.o. male with medical history significant of HTN, urinary retention s/p suprapubic catheter placement,  HLD. Presents to ED from home via EMS w/ weakness. CT scan: There is a moderate-sized pulmonary embolus in the distal left main pulmonary artery and extending into the proximal left upper lobe pulmonary artery without occlusion. Hgb and plt stable. No DOAC PTA.   5/6 0903 HL 0.29   Goal of Therapy:  Heparin  level 0.3-0.7 units/ml Monitor platelets by anticoagulation protocol: Yes   Plan:  Heparin  level is slightly subtherapeutic. Will give heparin  bolus of 900 units x 1 and increase heparin  infusion to 1200 units/hr. Recheck heparin  level in 8 hours. CBC daily while on heparin .   Trinidad Funk, PharmD 10/07/2023,10:37 AM

## 2023-10-07 NOTE — Anesthesia Postprocedure Evaluation (Signed)
 Anesthesia Post Note  Patient: Zachary [name]  Procedure(s) Performed: ECHOCARDIOGRAM, TRANSESOPHAGEAL  Patient location during evaluation: Specials Recovery Anesthesia Type: General Level of consciousness: awake and alert Pain management: pain level controlled Vital Signs Assessment: post-procedure vital signs reviewed and stable Respiratory status: spontaneous breathing, nonlabored ventilation, respiratory function stable and patient connected to nasal cannula oxygen  Cardiovascular status: blood pressure returned to baseline and stable Postop Assessment: no apparent nausea or vomiting Anesthetic complications: no   No notable events documented.   Last Vitals:  Vitals:   10/07/23 1315 10/07/23 1330  BP: (!) 87/64 99/62  Pulse: (!) 55 67  Resp: (!) 22 17  Temp:    SpO2: 100% 99%    Last Pain:  Vitals:   10/07/23 1330  TempSrc:   PainSc: 0-No pain                 Portia Brittle Patsy Varma

## 2023-10-07 NOTE — Progress Notes (Signed)
*  PRELIMINARY RESULTS* Echocardiogram Echocardiogram Transesophageal has been performed.  Zachary [name] 10/07/2023, 12:52 PM

## 2023-10-07 NOTE — Progress Notes (Signed)
 Performed at 1225 pm 10/07/2023

## 2023-10-07 NOTE — Progress Notes (Signed)
 Physical Therapy Treatment Patient Details Name: Zachary [name] MRN: [id number] DOB: 1940-09-26 Today's Date: 10/07/2023   History of Present Illness Zachary [name] is an 82yoM who comes to [facility] with acute weakness at home, s/p recent fall, no significant injury sustained. Pt still generally weak from his admission last month. PMH: HLD, HTN, CAD, menieres disease, B12 deficiency, suprapubic catheter.    PT Comments  Pt received supine in bed motivated to participate with PT. Pt moving well only needing supervision to CGA to perform mobility. Unfortunately limited in distance/treatment this date due to transport arriving for pt procedure. Is able however to easily stand to RW and ambulate to doorway and back without issue. NSG comes in post gait to assist in IV that fell out of LUE and left in care of transport and RN. All needs in reach. Will f/u per POC to address deficits.     If plan is discharge home, recommend the following: A little help with walking and/or transfers;Assistance with cooking/housework;Assist for transportation;Help with stairs or ramp for entrance   Can travel by private vehicle     No  Equipment Recommendations  None recommended by PT    Recommendations for Other Services       Precautions / Restrictions Precautions Precautions: Fall Recall of Precautions/Restrictions: Intact Restrictions Weight Bearing Restrictions Per Provider Order: No     Mobility  Bed Mobility Overal bed mobility: Needs Assistance Bed Mobility: Supine to Sit     Supine to sit: HOB elevated, Used rails, Supervision       Patient Response: Cooperative  Transfers Overall transfer level: Needs assistance Equipment used: Rolling walker (2 wheels) Transfers: Sit to/from Stand Sit to Stand: Contact guard assist                Ambulation/Gait Ambulation/Gait assistance: Supervision Gait Distance (Feet): 22 Feet Assistive device: Rolling walker (2 wheels) Gait  Pattern/deviations: Step-through pattern       General Gait Details: limited in distance due to transport needing to bring pt to procedure   Stairs             Wheelchair Mobility     Tilt Bed Tilt Bed Patient Response: Cooperative  Modified Rankin (Stroke Patients Only)       Balance Overall balance assessment: Needs assistance Sitting-balance support: Feet supported Sitting balance-Leahy Scale: Good     Standing balance support: Reliant on assistive device for balance, During functional activity, Bilateral upper extremity supported Standing balance-Leahy Scale: Fair                              Communication Communication Communication: No apparent difficulties;Impaired Factors Affecting Communication: Hearing impaired  Cognition Arousal: Alert Behavior During Therapy: WFL for tasks assessed/performed   PT - Cognitive impairments: No apparent impairments                         Following commands: Intact      Cueing Cueing Techniques: Verbal cues  Exercises      General Comments General comments (skin integrity, edema, etc.): Pt's IV in LUE fell out during bed mobility. Active bledding with wash cloth applied with pressure. Easily contained. NSG called and assisted during session.      Pertinent Vitals/Pain Pain Assessment Pain Assessment: No/denies pain    Home Living  Prior Function            PT Goals (current goals can now be found in the care plan section) Acute Rehab PT Goals Patient Stated Goal: regain strength, be able to return to short community AMB PT Goal Formulation: With patient Time For Goal Achievement: 10/15/23 Potential to Achieve Goals: Fair Progress towards PT goals: Progressing toward goals    Frequency    Min 2X/week      PT Plan      Co-evaluation              AM-PAC PT "6 Clicks" Mobility   Outcome Measure  Help needed turning from your  back to your side while in a flat bed without using bedrails?: A Little Help needed moving from lying on your back to sitting on the side of a flat bed without using bedrails?: A Little Help needed moving to and from a bed to a chair (including a wheelchair)?: A Little Help needed standing up from a chair using your arms (e.g., wheelchair or bedside chair)?: A Little Help needed to walk in hospital room?: A Little Help needed climbing 3-5 steps with a railing? : A Little 6 Click Score: 18    End of Session Equipment Utilized During Treatment: Gait belt Activity Tolerance: Patient tolerated treatment well;Other (comment) (transport/procedure) Patient left: in bed;with call bell/phone within reach;with nursing/sitter in room Nurse Communication: Mobility status PT Visit Diagnosis: Difficulty in walking, not elsewhere classified (R26.2);Muscle weakness (generalized) (M62.81)     Time: 1610-9604 PT Time Calculation (min) (ACUTE ONLY): 12 min  Charges:    $Therapeutic Activity: 8-22 mins                      Marc Senior. Fairly IV, PT, DPT Physical Therapist- [place]  [facility]  10/07/2023, 1:30 PM

## 2023-10-07 NOTE — Progress Notes (Signed)
 [facility] CLINIC CARDIOLOGY PROGRESS NOTE       Patient ID: Zachary [name] MRN: [id number] DOB/AGE: July 29, 1940 83 y.o.  Admit date: 09/30/2023 Referring Physician Dr. Melodi Sprung Primary Physician [name], MD  Primary Cardiologist Dr. Braxton Calico Reason for Consultation MRSA bacteremia, need for TEE  HPI: Zachary [name] is a 83 y.o. male  with a past medical history of hypertension, hyperlipidemia, urinary retention s/p suprapubic catheter placement who presented to the ED on 09/30/2023 for weakness. Found to be septic, blood cx positive for MRSA. Cardiology was consulted for further evaluation.   Interval history: -Patient seem and examined this AM, resting comfortably in bed.  -No complaints of SOB or CP. Remains on room air.  -Cr, Hgb, PLT stable on AM labs.  -Plan for TEE later today, patient aware of plan and amenable to proceeding.    Review of systems complete and found to be negative unless listed above    Past Medical History:  Diagnosis Date   B12 deficiency anemia 08/03/2019   Hypercholesterolemia    Hypertension    Vertigo    last episode approx 09/2018    Past Surgical History:  Procedure Laterality Date   APPENDECTOMY     CATARACT EXTRACTION Bilateral    COLONOSCOPY WITH PROPOFOL  N/A 12/24/2018   Procedure: COLONOSCOPY WITH PROPOFOL ;  Surgeon: Marnee Sink, MD;  Location: [facility] ENDOSCOPY;  Service: Endoscopy;  Laterality: N/A;   COLONOSCOPY WITH PROPOFOL  N/A 12/13/2019   Procedure: COLONOSCOPY WITH BIOPSY ;  Surgeon: Marnee Sink, MD;  Location: [facility];  Service: Endoscopy;  Laterality: N/A;  priority 3   IR CYSTOSTOMY TUBE PLACEMENT/BLADDER ASPIRATION  08/26/2023   PLEURAL SCARIFICATION  05/2015   POLYPECTOMY N/A 12/13/2019   Procedure: POLYPECTOMY;  Surgeon: Marnee Sink, MD;  Location: [facility];  Service: Endoscopy;  Laterality: N/A;    Medications Prior to Admission  Medication Sig Dispense Refill Last Dose/Taking    acetaminophen  (TYLENOL ) 325 MG tablet Take 2 tablets (650 mg total) by mouth every 6 (six) hours as needed for mild pain (pain score 1-3), headache or fever (or Fever >/= 101).   Past Week   atorvastatin  (LIPITOR) 10 MG tablet Take 10 mg by mouth daily.   09/29/2023   calcium  carbonate (OS-CAL - DOSED IN MG OF ELEMENTAL CALCIUM ) 1250 (500 Ca) MG tablet Take 1 tablet (1,250 mg total) by mouth 2 (two) times daily with a meal. 30 tablet 0 09/29/2023   Multiple Vitamin (MULTIVITAMIN WITH MINERALS) TABS tablet Take 1 tablet by mouth daily.   09/29/2023   triamterene -hydrochlorothiazide (DYAZIDE) 37.5-25 MG capsule Take 1 capsule by mouth daily.   09/29/2023   [Paused] aspirin  EC 81 MG tablet Take 81 mg by mouth at bedtime. Pt states he has resumed as of 09/26/23      Social History   Socioeconomic History   Marital status: Married    Spouse name: Not on file   Number of children: 0   Years of education: Not on file   Highest education level: 12th grade  Occupational History   Occupation: retired  Tobacco Use   Smoking status: Never   Smokeless tobacco: Never  Vaping Use   Vaping status: Never Used  Substance and Sexual Activity   Alcohol  use: No    Alcohol /week: 0.0 standard drinks of alcohol    Drug use: No   Sexual activity: Not on file  Other Topics Concern   Not on file  Social History Narrative   Lives with  Dell, wife.    Social Drivers of Corporate investment banker Strain: Low Risk  (09/25/2021)   Overall Financial Resource Strain (CARDIA)    Difficulty of Paying Living Expenses: Not hard at all  Food Insecurity: No Food Insecurity (09/30/2023)   Hunger Vital Sign    Worried About Running Out of Food in the Last Year: Never true    Ran Out of Food in the Last Year: Never true  Transportation Needs: No Transportation Needs (09/30/2023)   PRAPARE - Administrator, Civil Service (Medical): No    Lack of Transportation (Non-Medical): No  Physical Activity:  Insufficiently Active (09/25/2021)   Exercise Vital Sign    Days of Exercise per Week: 2 days    Minutes of Exercise per Session: 60 min  Stress: No Stress Concern Present (11/24/2019)   [name]    Feeling of Stress : Not at all  Social Connections: Moderately Isolated (09/30/2023)   Social Connection and Isolation Panel [NHANES]    Frequency of Communication with Friends and Family: Twice a week    Frequency of Social Gatherings with Friends and Family: More than three times a week    Attends Religious Services: Never    Database administrator or Organizations: No    Attends Banker Meetings: Never    Marital Status: Married  Catering manager Violence: Not At Risk (09/30/2023)   Humiliation, Afraid, Rape, and Kick questionnaire    Fear of Current or Ex-Partner: No    Emotionally Abused: No    Physically Abused: No    Sexually Abused: No    Family History  Problem Relation Age of Onset   Hypertension Mother    Hyperlipidemia Mother    Heart attack Father    Hypertension Father    CVA Father    ALS Brother    Prostate cancer Brother      Vitals:   10/07/23 0230 10/07/23 0400 10/07/23 0430 10/07/23 0750  BP: (!) 96/58 92/60 106/62 106/61  Pulse:  (!) 51  66  Resp:  18  18  Temp:  97.9 F (36.6 C)  98.4 F (36.9 C)  TempSrc:    Oral  SpO2:  97%    Weight:      Height:        PHYSICAL EXAM General: Chronically ill-appearing elderly male, cachectic, in no acute distress. HEENT: Normocephalic and atraumatic. Neck: No JVD.  Lungs: Normal respiratory effort on room air.  Right-sided rhonchi Heart: HRRR. Normal S1 and S2 without gallops or murmurs.  Abdomen: Non-distended appearing.  Msk: Normal strength and tone for age. Extremities: Warm and well perfused. No clubbing, cyanosis.  No edema.  Neuro: Alert and oriented X 3. Psych: Answers questions appropriately.   Labs: Basic Metabolic  Panel: Recent Labs    10/06/23 0434 10/07/23 0316  NA 137 132*  K 4.2 4.3  CL 106 102  CO2 22 21*  GLUCOSE 113* 101*  BUN 54* 53*  CREATININE 1.54* 1.43*  CALCIUM  8.1* 8.0*   Liver Function Tests: Recent Labs    10/06/23 0434 10/07/23 0316  AST 130* 94*  ALT 152* 135*  ALKPHOS 223* 219*  BILITOT 0.8 0.8  PROT 4.7* 4.9*  ALBUMIN 1.6* 1.7*   No results for input(s): "LIPASE", "AMYLASE" in the last 72 hours. CBC: Recent Labs    10/06/23 0434 10/07/23 0316  WBC 19.8* 20.8*  HGB 9.2* 9.0*  HCT 27.6*  26.8*  MCV 98.6 97.5  PLT 338 350   Cardiac Enzymes: Recent Labs    10/06/23 1307  CKTOTAL 13*   BNP: No results for input(s): "BNP" in the last 72 hours. D-Dimer: No results for input(s): "DDIMER" in the last 72 hours. Hemoglobin A1C: No results for input(s): "HGBA1C" in the last 72 hours. Fasting Lipid Panel: No results for input(s): "CHOL", "HDL", "LDLCALC", "TRIG", "CHOLHDL", "LDLDIRECT" in the last 72 hours. Thyroid  Function Tests: No results for input(s): "TSH", "T4TOTAL", "T3FREE", "THYROIDAB" in the last 72 hours.  Invalid input(s): "FREET3" Anemia Panel: No results for input(s): "VITAMINB12", "FOLATE", "FERRITIN", "TIBC", "IRON", "RETICCTPCT" in the last 72 hours.   Radiology: CT CHEST ABDOMEN PELVIS W CONTRAST Result Date: 10/06/2023 CLINICAL DATA:  Worsening leukocytosis and transaminitis despite antibiotic treatment. Unintentional weight loss. Known cavitary pneumonia. Clinical concern for possible abscess or malignancy. EXAM: CT CHEST, ABDOMEN, AND PELVIS WITH CONTRAST TECHNIQUE: Multidetector CT imaging of the chest, abdomen and pelvis was performed following the standard protocol during bolus administration of intravenous contrast. RADIATION DOSE REDUCTION: This exam was performed according to the departmental dose-optimization program which includes automated exposure control, adjustment of the mA and/or kV according to patient size and/or use of  iterative reconstruction technique. CONTRAST:  80mL OMNIPAQUE  IOHEXOL  300 MG/ML  SOLN COMPARISON:  Chest CT dated 10/01/2023. Abdomen and pelvis CT dated 08/08/2023. FINDINGS: CT CHEST FINDINGS Cardiovascular: There is a moderate-sized pulmonary embolus in the distal left main pulmonary artery and extending into the proximal left upper lobe pulmonary artery without occlusion. A smaller embolus is demonstrated in a right lower lobe pulmonary artery branch. These are not sufficient to cause right heart strain. The previously demonstrated 4.5 cm ascending thoracic aorta aneurysm currently measures 4.3 cm. Enlarged central pulmonary arteries with a main pulmonary artery measuring 3.6 cm in diameter. Enlarged heart. Atheromatous calcifications, including the coronary arteries and aorta. Slight increase in size of a small pericardial effusion with a maximum thickness of 8 mm. Mediastinum/Nodes: No enlarged mediastinal, hilar, or axillary lymph nodes. Thyroid  gland, trachea, and esophagus demonstrate no significant findings. Again demonstrated is a large hiatal hernia with an intrathoracic stomach. Lungs/Pleura: Increased size and confluence of the large cavitary lesion previously demonstrated in the right upper lobe with a moderately thick, irregular peripheral rind and adjacent airspace opacity. This continues to contain some fluid, with mild improvement. This area currently measures 13.2 x 8.2 cm on image number 94/4, previously 8.6 x 6.5 cm in corresponding dimensions. Continued patchy airspace opacity in the adjacent portions of the right upper lobe. These areas have increased some in size and decreased some in density. The previously demonstrated right middle lobe nodular density has cavitated with 2 adjacent areas of cavitation and patchy opacity currently demonstrated in that area. Small to moderate-sized bilateral pleural effusions, increased on the right and without significant change on the left. Mild bilateral  lower lobe atelectasis. Musculoskeletal: Mild thoracic spine degenerative changes. No evidence of bony metastatic disease. CT ABDOMEN PELVIS FINDINGS Hepatobiliary: No focal liver abnormality is seen. No gallstones, gallbladder wall thickening, or biliary dilatation. Pancreas: Unremarkable. No pancreatic ductal dilatation or surrounding inflammatory changes. Spleen: Normal in size without focal abnormality. Adrenals/Urinary Tract: Moderate dilatation of the left renal collecting system and ureter with mild progression mild dilatation of the right renal collecting system and ureter with mild improvement. Again demonstrated is asymmetrical anterior bladder wall thickening, thicker on the right, with improvement. A suprapubic bladder catheter is currently demonstrated. Stable fat density left adrenal  myelolipoma and probable lipid poor left adrenal adenoma. Unremarkable right adrenal gland. Stomach/Bowel: Extensive sigmoid and descending colon diverticulosis without evidence of diverticulitis. The appendix is not visualized. Again noted is the entire stomach within a large hiatal hernia. Unremarkable small bowel. Vascular/Lymphatic: Atheromatous arterial calcifications without aneurysm. No enlarged lymph nodes. Reproductive: Mildly enlarged and heterogeneous prostate gland with coarse calcifications. Other: No abdominal wall hernia or abnormality. No abdominopelvic ascites. Musculoskeletal: Moderate lumbar spine degenerative changes and mild scoliosis. IMPRESSION: 1. Bilateral pulmonary emboli, as described above. These are not sufficient to cause right heart strain. 2. Increased size and confluence of the large cavitary lesion previously demonstrated in the right upper lobe with a moderately thick, irregular peripheral rind and adjacent airspace opacity. This continues to contain some fluid, with mild improvement. This is most likely due to a cavitary pneumonia. A cavitary neoplasm is less likely since it was not  present on the abdomen and pelvis CT obtained on 08/08/2023. 3. Continued patchy airspace opacity in the adjacent portions of the right upper lobe. These areas have increased some in size and decreased some in density, compatible with ongoing pneumonia. 4. The previously demonstrated right middle lobe nodular density has cavitated with 2 adjacent areas of cavitation and patchy opacity currently demonstrated in that area, also compatible with cavitary pneumonia. 5. Small to moderate-sized bilateral pleural effusions, increased on the right and without significant change on the left. 6. Mild bilateral lower lobe atelectasis. 7. Slight increase in size of a small pericardial effusion. 8. 4.3 cm ascending thoracic aorta aneurysm. Recommend annual imaging followup by CTA or MRA. This recommendation follows 2010 ACCF/AHA/AATS/ACR/ASA/SCA/SCAI/SIR/STS/SVM Guidelines for the Diagnosis and Management of Patients with Thoracic Aortic Disease. Circulation. 2010; 121: R604-V409. Aortic aneurysm NOS (ICD10-I71.9) 9. Enlarged central pulmonary arteries, suggesting pulmonary arterial hypertension. 10. Cardiomegaly. 11. Large hiatal hernia with an intrathoracic stomach. 12. Moderate dilatation of the left renal collecting system and ureter with mild progression. Mild dilatation of the right renal collecting system and ureter with mild improvement. This remains compatible with bilateral ureteral obstruction at the level of the ureterovesical junction 13. Asymmetrical anterior bladder wall thickening, thicker on the right, with improvement. A suprapubic bladder catheter is currently demonstrated. 14. Stable left adrenal myelolipoma and probable lipid poor adenoma. 15. Extensive sigmoid and descending colon diverticulosis without evidence of diverticulitis. 16. Mildly enlarged and heterogeneous prostate gland. 17.  Calcific coronary artery and aortic atherosclerosis. Aortic Atherosclerosis (ICD10-I70.0). Critical Value/emergent  results were called by telephone at the time of interpretation on 10/06/2023 at 2:14 pm to provider Melodi Sprung , who verbally acknowledged these results. Electronically Signed   By: Catherin Closs M.D.   On: 10/06/2023 14:32   CT CHEST WO CONTRAST Result Date: 10/01/2023 CLINICAL DATA:  Weakness, fever, abnormal x-ray EXAM: CT CHEST WITHOUT CONTRAST TECHNIQUE: Multidetector CT imaging of the chest was performed following the standard protocol without IV contrast. RADIATION DOSE REDUCTION: This exam was performed according to the departmental dose-optimization program which includes automated exposure control, adjustment of the mA and/or kV according to patient size and/or use of iterative reconstruction technique. COMPARISON:  03/04/2022, 08/08/2023, 09/30/2023 FINDINGS: Cardiovascular: Unenhanced imaging of the heart is unremarkable, without significant pericardial effusion. 4.5 cm ascending thoracic aortic aneurysm. Atherosclerosis of the aorta and coronary vasculature. Assessment of the vascular lumen cannot be performed without intravenous contrast. Mediastinum/Nodes: No enlarged mediastinal or axillary lymph nodes. Thyroid  gland, trachea, and esophagus demonstrate no significant findings. Moderate hiatal hernia. Lungs/Pleura: There is a thick walled right upper  lobe cavity measuring 9.2 x 7.2 x 7.5 cm, with gas fluid level. There is surrounding dense airspace disease within the right upper lobe as well as within the right middle lobe. Findings are most consistent with cavitary pneumonia, new since the 08/08/2023 CT. There are small free-flowing bilateral pleural effusions, right greater than left. Dependent consolidation within the lower lobes most consistent with atelectasis. No pneumothorax. Central airways are patent. Upper Abdomen: Upper abdominal ascites. Musculoskeletal: No acute or destructive bony abnormalities. Reconstructed images demonstrate no additional findings. IMPRESSION: 1. Large thick  walled right upper lobe cavity with gas fluid level, with dense airspace disease involving the right upper and right middle lobes, most consistent with cavitating pneumonia. This has developed since the abdominal CT 08/08/2023. 2. Small free-flowing bilateral pleural effusions. 3. Moderate hiatal hernia. 4. 4.5 cm ascending thoracic aortic aneurysm. Ascending thoracic aortic aneurysm. Recommend semi-annual imaging followup by CTA or MRA and referral to cardiothoracic surgery if not already obtained. This recommendation follows 2010 ACCF/AHA/AATS/ACR/ASA/SCA/SCAI/SIR/STS/SVM Guidelines for the Diagnosis and Management of Patients With Thoracic Aortic Disease. Circulation. 2010; 121: Z610-R604. Aortic aneurysm NOS (ICD10-I71.9) 5.  Aortic Atherosclerosis (ICD10-I70.0). 6. Upper abdominal ascites. Electronically Signed   By: Bobbye Burrow M.D.   On: 10/01/2023 20:12   DG Chest Port 1 View Result Date: 09/30/2023 CLINICAL DATA:  83 year old male with possible sepsis. EXAM: PORTABLE CHEST 1 VIEW COMPARISON:  Chest CT 03/04/2022 and earlier. FINDINGS: Portable AP supine views at 0600 hours. Chronic retrocardiac moderate to large gastric hiatal hernia. Stable cardiac size and mediastinal contours. Chronic pulmonary hyperinflation. Patchy and indistinct new multifocal right lung opacity in both the upper and lower lung. No superimposed pneumothorax, pulmonary edema, pleural effusion. No definite left lung involvement. No acute osseous abnormality identified. Nondilated gas-filled bowel in the upper abdomen. IMPRESSION: 1. Chronic pulmonary hyperinflation with indistinct new multifocal right lung opacity suspicious for developing multilobar bronchopneumonia/pneumonia in this setting. No pleural effusion. 2. Chronic moderate to large hiatal hernia. Electronically Signed   By: Marlise Simpers M.D.   On: 09/30/2023 06:25   CT Cervical Spine Wo Contrast Result Date: 09/30/2023 CLINICAL DATA:  83 year old male status post fall in  garage ester day. Generalized weakness, decreased p.o. Hematuria. Shortness of breath. EXAM: CT CERVICAL SPINE WITHOUT CONTRAST TECHNIQUE: Multidetector CT imaging of the cervical spine was performed without intravenous contrast. Multiplanar CT image reconstructions were also generated. RADIATION DOSE REDUCTION: This exam was performed according to the departmental dose-optimization program which includes automated exposure control, adjustment of the mA and/or kV according to patient size and/or use of iterative reconstruction technique. COMPARISON:  Head CT today. FINDINGS: Alignment: Maintained cervical lordosis. Cervicothoracic junction alignment is within normal limits. Bilateral posterior element alignment is within normal limits. Skull base and vertebrae: Bone mineralization is within normal limits for age. Visualized skull base is intact. No atlanto-occipital dissociation. C1 and C2 appear intact and aligned. No acute osseous abnormality identified. Soft tissues and spinal canal: No prevertebral fluid or swelling. No visible canal hematoma. Bulky calcified carotid atherosclerosis in the neck, severe on the right (series 3, image 58). Disc levels: Generally mild for age cervical spine degeneration and capacious CT appearance of the cervical spinal canal at most levels. Upper chest: Visible upper thoracic levels appear intact. Clear lung apices with questionable emphysema. Trace intravenous gas at the thoracic inlet, likely IV access related. IMPRESSION: 1. No acute traumatic injury identified in the cervical spine. Mild for age cervical spine degeneration. 2. Calcified carotid atherosclerosis in the neck, Severe  at the Right ICA. Electronically Signed   By: Marlise Simpers M.D.   On: 09/30/2023 05:57   CT HEAD WO CONTRAST ( ) Result Date: 09/30/2023 CLINICAL DATA:  83 year old male status post fall in garage ester day. Generalized weakness, decreased p.o. Hematuria. Shortness of breath. EXAM: CT HEAD WITHOUT  CONTRAST TECHNIQUE: Contiguous axial images were obtained from the base of the skull through the vertex without intravenous contrast. RADIATION DOSE REDUCTION: This exam was performed according to the departmental dose-optimization program which includes automated exposure control, adjustment of the mA and/or kV according to patient size and/or use of iterative reconstruction technique. COMPARISON:  Brain MRI 04/12/2020.  Head CT 01/20/2017. FINDINGS: Brain: Cerebral volume stable and within normal limits for age. Choroid plexus cysts, normal variant. No midline shift, ventriculomegaly, mass effect, evidence of mass lesion, intracranial hemorrhage or evidence of cortically based acute infarction. Gray-white differentiation stable and within normal limits for age. Vascular: Advanced calcified atherosclerosis at the skull base. No suspicious intracranial vascular hyperdensity. Skull: Stable, intact. Sinuses/Orbits: Visualized paranasal sinuses and mastoids are clear. Other: No acute orbit or scalp soft tissue injury identified. Stable postoperative changes to the globes. IMPRESSION: 1. No acute intracranial abnormality or acute traumatic injury identified. 2. Stable and negative for age noncontrast CT appearance of the brain. Electronically Signed   By: Marlise Simpers M.D.   On: 09/30/2023 05:55    ECHO ordered  TELEMETRY reviewed by me 10/07/2023: Not on telemetry  EKG reviewed by me: sinus rhythm PVCs PACs rate 92 bpm  Data reviewed by me 10/07/2023: last 24h vitals tele labs imaging I/O hospitalist progress note  Principal Problem:   Sepsis (HCC) Active Problems:   Mixed hyperlipidemia   Essential hypertension   Acute kidney injury superimposed on chronic kidney disease (HCC)   Pneumonia   UTI (urinary tract infection)   Acute encephalopathy   CKD (chronic kidney disease) stage 4, GFR 15-29 ml/min (HCC)   NSTEMI (non-ST elevated myocardial infarction) (HCC)   Protein-calorie malnutrition, severe   MRSA  bacteremia   Cavitary pneumonia    ASSESSMENT AND PLAN:  Zachary [name] is a 83 y.o. male  with a past medical history of hypertension, hyperlipidemia, urinary retention s/p suprapubic catheter placement who presented to the ED on 09/30/2023 for weakness. Found to be septic, blood cx positive for MRSA. Cardiology was consulted for further evaluation.   # MRSA Bacteremia # Cavitary pneumonia # Pulmonary embolus # Hypertension # Hyperlipidemia Patient presented with complaints of weakness, found to be septic and had positive blood cultures with MRSA.  Initial chest x-ray with right lung opacity concerning for pneumonia.  CT chest today revealed large cavitary lesion of right upper lobe as well as bilateral pulmonary emboli. -TTE ordered to evaluate RV strain. Not yet done. -TEE planned for today to rule out endocarditis.  -Continue heparin  for PE.  -Further management of pulmonary emboli, pneumonia, bacteremia per primary team and infectious disease. -Home statin held given transaminitis.  Resume when liver function improves.   This patient's plan of care was discussed and created with Dr. Bob Burn and he is in agreement.  Signed: Hamp Levine, PA-C  10/07/2023, 10:17 AM [facility]

## 2023-10-07 NOTE — Transfer of Care (Signed)
 Immediate Anesthesia Transfer of Care Note  Patient: Zachary [name]  Procedure(s) Performed: ECHOCARDIOGRAM, TRANSESOPHAGEAL  Patient Location: PACU  Anesthesia Type:General  Level of Consciousness: awake, alert , and oriented  Airway & Oxygen  Therapy: Patient Spontanous Breathing  Post-op Assessment: Report given to RN and Post -op Vital signs reviewed and stable  Post vital signs: Reviewed and stable  Last Vitals:  Vitals Value Taken Time  BP 104/60 10/07/23 1254  Temp    Pulse 57 10/07/23 1255  Resp 20 10/07/23 1256  SpO2 88 % 10/07/23 1255  Vitals shown include unfiled device data.  Last Pain:  Vitals:   10/07/23 1208  TempSrc: Oral  PainSc: 0-No pain         Complications: No notable events documented.

## 2023-10-07 NOTE — Consult Note (Signed)
 PHARMACY - ANTICOAGULATION CONSULT NOTE  Pharmacy Consult for Heparin   Indication: pulmonary embolus  No Known Allergies  Patient Measurements: Height: 6\' 2"  (188 cm) Weight: 56.7 kg (125 lb) IBW/kg (Calculated) : 82.2 HEPARIN  DW (KG): 56.7  Vital Signs: Temp: 98 F (36.7 C) (05/05 2338) Temp Source: Oral (05/05 1650) BP: 88/63 (05/05 2338) Pulse Rate: 72 (05/05 2338)  Labs: Recent Labs    10/04/23 0432 10/05/23 0424 10/06/23 0434 10/06/23 1307 10/06/23 1532 10/07/23 0006  HGB 9.7*  --  9.2*  --   --   --   HCT 29.3*  --  27.6*  --   --   --   PLT 281  --  338  --   --   --   APTT  --   --   --   --  40*  --   LABPROT  --   --   --   --  15.3*  --   INR  --   --   --   --  1.2  --   HEPARINUNFRC  --   --   --   --   --  0.18*  CREATININE 1.70* 1.60* 1.54*  --   --   --   CKTOTAL  --   --   --  13*  --   --     Estimated Creatinine Clearance: 30.7 mL/min (A) (by C-G formula based on SCr of 1.54 mg/dL (H)).   Medical History: Past Medical History:  Diagnosis Date   B12 deficiency anemia 08/03/2019   Hypercholesterolemia    Hypertension    Vertigo    last episode approx 09/2018    Medications:  Medications Prior to Admission  Medication Sig Dispense Refill Last Dose/Taking   acetaminophen  (TYLENOL ) 325 MG tablet Take 2 tablets (650 mg total) by mouth every 6 (six) hours as needed for mild pain (pain score 1-3), headache or fever (or Fever >/= 101).   Past Week   atorvastatin  (LIPITOR) 10 MG tablet Take 10 mg by mouth daily.   09/29/2023   calcium  carbonate (OS-CAL - DOSED IN MG OF ELEMENTAL CALCIUM ) 1250 (500 Ca) MG tablet Take 1 tablet (1,250 mg total) by mouth 2 (two) times daily with a meal. 30 tablet 0 09/29/2023   Multiple Vitamin (MULTIVITAMIN WITH MINERALS) TABS tablet Take 1 tablet by mouth daily.   09/29/2023   triamterene -hydrochlorothiazide (DYAZIDE) 37.5-25 MG capsule Take 1 capsule by mouth daily.   09/29/2023   [Paused] aspirin  EC 81 MG tablet Take 81  mg by mouth at bedtime. Pt states he has resumed as of 09/26/23      Scheduled:   Chlorhexidine  Gluconate Cloth  6 each Topical Q0600   feeding supplement  237 mL Oral TID BM   heparin   1,700 Units Intravenous Once   multivitamin with minerals  1 tablet Oral Daily   Infusions:   sodium chloride  20 mL/hr at 10/06/23 2302   heparin  900 Units/hr (10/06/23 1759)   meropenem  (MERREM ) IV 1 g (10/06/23 2041)   vancomycin  750 mg (10/06/23 1504)   PRN: albuterol , guaiFENesin-dextromethorphan, ondansetron  **OR** ondansetron  (ZOFRAN ) IV, sodium chloride  HYPERTONIC Anti-infectives (From admission, onward)    Start     Dose/Rate Route Frequency Ordered Stop   10/06/23 1800  meropenem  (MERREM ) 1 g in sodium chloride  0.9 % 100 mL IVPB        1 g 200 mL/hr over 30 Minutes Intravenous Every 12 hours 10/06/23 1548  10/06/23 1400  vancomycin  (VANCOREADY) IVPB 750 mg/150 mL        750 mg 150 mL/hr over 60 Minutes Intravenous Every 24 hours 10/06/23 1135     10/04/23 1400  vancomycin  (VANCOREADY) IVPB 500 mg/100 mL  Status:  Discontinued        500 mg 100 mL/hr over 60 Minutes Intravenous Every 24 hours 10/03/23 1624 10/06/23 1135   10/03/23 1800  vancomycin  (VANCOREADY) IVPB 500 mg/100 mL        500 mg 100 mL/hr over 60 Minutes Intravenous  Once 10/03/23 1624 10/03/23 1859   10/02/23 2200  linezolid  (ZYVOX ) tablet 600 mg  Status:  Discontinued        600 mg Oral Every 12 hours 10/02/23 1550 10/03/23 1501   10/01/23 0900  vancomycin  (VANCOCIN ) IVPB 1000 mg/200 mL premix  Status:  Discontinued        1,000 mg 200 mL/hr over 60 Minutes Intravenous Every 48 hours 10/01/23 0736 10/02/23 1550   10/01/23 0800  vancomycin  variable dose per unstable renal function (pharmacist dosing)  Status:  Discontinued         Does not apply See admin instructions 09/30/23 0747 10/01/23 0738   09/30/23 1000  meropenem  (MERREM ) 500 mg in sodium chloride  0.9 % 100 mL IVPB  Status:  Discontinued        500 mg 200 mL/hr  over 30 Minutes Intravenous Every 12 hours 09/30/23 0728 10/02/23 1550   09/30/23 0500  ceFEPIme  (MAXIPIME ) 2 g in sodium chloride  0.9 % 100 mL IVPB        2 g 200 mL/hr over 30 Minutes Intravenous  Once 09/30/23 0458 09/30/23 0608   09/30/23 0500  metroNIDAZOLE  (FLAGYL ) IVPB 500 mg        500 mg 100 mL/hr over 60 Minutes Intravenous  Once 09/30/23 0458 09/30/23 0826   09/30/23 0500  vancomycin  (VANCOCIN ) IVPB 1000 mg/200 mL premix        1,000 mg 200 mL/hr over 60 Minutes Intravenous  Once 09/30/23 0458 09/30/23 4696       Assessment: 83 y.o. male with medical history significant of HTN, urinary retention s/p suprapubic catheter placement,  HLD. Presents to ED from home via EMS w/ weakness. CT scan: There is a moderate-sized pulmonary embolus in the distal left main pulmonary artery and extending into the proximal left upper lobe pulmonary artery without occlusion. Hgb and plt stable. No DOAC PTA.   Goal of Therapy:  Heparin  level 0.3-0.7 units/ml Monitor platelets by anticoagulation protocol: Yes   Plan:  5/6:  HL @ 0006 = 0.18, SUBtherapeutic - Will order heparin  1700 units IV X 1 bolus and increase drip rate to 1100 units/hr - Will recheck HL 8 hrs after rate change   Miette Molenda D, PharmD 10/07/2023,12:48 AM

## 2023-10-07 NOTE — TOC Progression Note (Signed)
 Transition of Care [facility]) - Progression Note    Patient Details  Name: Zachary [name] MRN: [id number] Date of Birth: 10-03-1940  Transition of Care [facility]) CM/SW Contact  Odilia Bennett, LCSW Phone Number: 10/07/2023, 2:34 PM  Clinical Narrative:  Patient confirmed he would like to accept the bed offer from [facility]. Per attending physician, patient is not medically stable for discharge. Insurance authorization is valid through today. Will restart when medically stable.  Expected Discharge Plan: Skilled Nursing Facility Barriers to Discharge: Patient left Against Medical Advice [facility])  Expected Discharge Plan and Services     Post Acute Care Choice: Skilled Nursing Facility Living arrangements for the past 2 months: Single Family Home                           HH Arranged: RN, PT, OT, Nurse's Aide HH Agency: [facility] Date [facility] Agency Contacted: 10/02/23   Representative spoke with at [facility] Agency: Verdis Glade   Social Determinants of Health (SDOH) Interventions SDOH Screenings   Food Insecurity: No Food Insecurity (09/30/2023)  Housing: Low Risk  (09/30/2023)  Transportation Needs: No Transportation Needs (09/30/2023)  Utilities: Not At Risk (09/30/2023)  Alcohol  Screen: Low Risk  (09/25/2021)  Depression (PHQ2-9): Low Risk  (09/26/2023)  Financial Resource Strain: Low Risk  (09/25/2021)  Physical Activity: Insufficiently Active (09/25/2021)  Social Connections: Moderately Isolated (09/30/2023)  Stress: No Stress Concern Present (11/24/2019)  Tobacco Use: Low Risk  (09/30/2023)    Readmission Risk Interventions    10/01/2023    2:47 PM  Readmission Risk Prevention Plan  PCP or Specialist Appt within 3-5 Days Complete  Social Work Consult for Recovery Care Planning/Counseling Complete  Palliative Care Screening Not Applicable

## 2023-10-08 ENCOUNTER — Inpatient Hospital Stay

## 2023-10-08 ENCOUNTER — Inpatient Hospital Stay
Admit: 2023-10-08 | Discharge: 2023-10-08 | Disposition: A | Discharge status: Home / Self Care | Attending: Student | Admitting: Student

## 2023-10-08 ENCOUNTER — Encounter: Payer: Self-pay | Admitting: Cardiology

## 2023-10-08 DIAGNOSIS — Z515 Encounter for palliative care: Secondary | ICD-10-CM

## 2023-10-08 DIAGNOSIS — J90 Pleural effusion, not elsewhere classified: Secondary | ICD-10-CM | POA: Diagnosis not present

## 2023-10-08 DIAGNOSIS — B9562 Methicillin resistant Staphylococcus aureus infection as the cause of diseases classified elsewhere: Secondary | ICD-10-CM | POA: Diagnosis not present

## 2023-10-08 DIAGNOSIS — A403 Sepsis due to Streptococcus pneumoniae: Secondary | ICD-10-CM | POA: Diagnosis not present

## 2023-10-08 DIAGNOSIS — J189 Pneumonia, unspecified organism: Secondary | ICD-10-CM | POA: Diagnosis not present

## 2023-10-08 DIAGNOSIS — R051 Acute cough: Secondary | ICD-10-CM

## 2023-10-08 DIAGNOSIS — R6520 Severe sepsis without septic shock: Secondary | ICD-10-CM | POA: Diagnosis not present

## 2023-10-08 DIAGNOSIS — J984 Other disorders of lung: Secondary | ICD-10-CM | POA: Diagnosis not present

## 2023-10-08 DIAGNOSIS — R7989 Other specified abnormal findings of blood chemistry: Secondary | ICD-10-CM | POA: Diagnosis not present

## 2023-10-08 DIAGNOSIS — R7881 Bacteremia: Secondary | ICD-10-CM | POA: Diagnosis not present

## 2023-10-08 DIAGNOSIS — N390 Urinary tract infection, site not specified: Secondary | ICD-10-CM | POA: Diagnosis not present

## 2023-10-08 DIAGNOSIS — G9341 Metabolic encephalopathy: Secondary | ICD-10-CM | POA: Diagnosis not present

## 2023-10-08 DIAGNOSIS — A419 Sepsis, unspecified organism: Secondary | ICD-10-CM | POA: Diagnosis not present

## 2023-10-08 LAB — CULTURE, BLOOD (ROUTINE X 2)
Culture: NO GROWTH
Culture: NO GROWTH
Special Requests: ADEQUATE
Special Requests: ADEQUATE

## 2023-10-08 LAB — COMPREHENSIVE METABOLIC PANEL WITH GFR
ALT: 105 U/L — ABNORMAL HIGH (ref 0–44)
AST: 57 U/L — ABNORMAL HIGH (ref 15–41)
Albumin: 1.6 g/dL — ABNORMAL LOW (ref 3.5–5.0)
Alkaline Phosphatase: 189 U/L — ABNORMAL HIGH (ref 38–126)
Anion gap: 8 (ref 5–15)
BUN: 52 mg/dL — ABNORMAL HIGH (ref 8–23)
CO2: 20 mmol/L — ABNORMAL LOW (ref 22–32)
Calcium: 7.8 mg/dL — ABNORMAL LOW (ref 8.9–10.3)
Chloride: 107 mmol/L (ref 98–111)
Creatinine, Ser: 1.5 mg/dL — ABNORMAL HIGH (ref 0.61–1.24)
GFR, Estimated: 47 mL/min — ABNORMAL LOW (ref >60)
Glucose, Bld: 93 mg/dL (ref 70–99)
Potassium: 4.3 mmol/L (ref 3.5–5.1)
Sodium: 135 mmol/L (ref 135–145)
Total Bilirubin: 0.8 mg/dL (ref 0.0–1.2)
Total Protein: 4.8 g/dL — ABNORMAL LOW (ref 6.5–8.1)

## 2023-10-08 LAB — HEPARIN LEVEL (UNFRACTIONATED)
Heparin Unfractionated: 0.6 [IU]/mL (ref 0.30–0.70)
Heparin Unfractionated: 0.48 [IU]/mL (ref 0.30–0.70)

## 2023-10-08 LAB — CBC
HCT: 26.8 % — ABNORMAL LOW (ref 39.0–52.0)
Hemoglobin: 8.8 g/dL — ABNORMAL LOW (ref 13.0–17.0)
MCH: 32.6 pg (ref 26.0–34.0)
MCHC: 32.8 g/dL (ref 30.0–36.0)
MCV: 99.3 fL (ref 80.0–100.0)
Platelets: 416 10*3/uL — ABNORMAL HIGH (ref 150–400)
RBC: 2.7 MIL/uL — ABNORMAL LOW (ref 4.22–5.81)
RDW: 14.7 % (ref 11.5–15.5)
WBC: 20.3 10*3/uL — ABNORMAL HIGH (ref 4.0–10.5)
nRBC: 0 % (ref 0.0–0.2)

## 2023-10-08 LAB — BODY FLUID CELL COUNT WITH DIFFERENTIAL
Eos, Fluid: 0 %
Lymphs, Fluid: 18 %
Monocyte-Macrophage-Serous Fluid: 6 %
Neutrophil Count, Fluid: 76 %
Total Nucleated Cell Count, Fluid: 1752 uL

## 2023-10-08 LAB — ALBUMIN, PLEURAL OR PERITONEAL FLUID: Albumin, Fluid: <1.5 g/dL

## 2023-10-08 LAB — GLUCOSE, PLEURAL OR PERITONEAL FLUID: Glucose, Fluid: 94 mg/dL

## 2023-10-08 LAB — PROTEIN, PLEURAL OR PERITONEAL FLUID: Total protein, fluid: <3 g/dL

## 2023-10-08 LAB — LACTATE DEHYDROGENASE, PLEURAL OR PERITONEAL FLUID: LD, Fluid: 144 U/L — ABNORMAL HIGH (ref 3–23)

## 2023-10-08 LAB — ACID FAST SMEAR (AFB, MYCOBACTERIA): Acid Fast Smear: NEGATIVE

## 2023-10-08 LAB — QUANTIFERON-TB GOLD PLUS (RQFGPL)
QuantiFERON Mitogen Value: 0.07 [IU]/mL
QuantiFERON Nil Value: 0.04 [IU]/mL
QuantiFERON TB1 Ag Value: 0.04 [IU]/mL
QuantiFERON TB2 Ag Value: 0.04 [IU]/mL

## 2023-10-08 LAB — QUANTIFERON-TB GOLD PLUS: QuantiFERON-TB Gold Plus: UNDETERMINED — AB

## 2023-10-08 LAB — PHOSPHORUS: Phosphorus: 3.5 mg/dL (ref 2.5–4.6)

## 2023-10-08 LAB — PROCALCITONIN: Procalcitonin: 0.37 ng/mL

## 2023-10-08 LAB — MAGNESIUM: Magnesium: 2.1 mg/dL (ref 1.7–2.4)

## 2023-10-08 MED ORDER — LIDOCAINE HCL (PF) 1 % IJ SOLN
10.0000 mL | Freq: Once | INTRAMUSCULAR | Status: AC
  Administered 2023-10-08: 10 mL via INTRADERMAL

## 2023-10-08 MED ORDER — MENTHOL 3 MG MT LOZG
1.0000 | LOZENGE | OROMUCOSAL | Status: DC | PRN
  Filled 2023-10-08: qty 9

## 2023-10-08 MED ORDER — LINEZOLID 600 MG/300ML IV SOLN
600.0000 mg | Freq: Two times a day (BID) | INTRAVENOUS | Status: AC
  Administered 2023-10-09 – 2023-10-13 (×8): 600 mg via INTRAVENOUS
  Filled 2023-10-08 (×10): qty 300

## 2023-10-08 NOTE — Consult Note (Signed)
 PHARMACY - ANTICOAGULATION CONSULT NOTE  Pharmacy Consult for Heparin   Indication: pulmonary embolus  No Known Allergies  Patient Measurements: Height: 6\' 2"  (188 cm) Weight: 56.7 kg (125 lb) IBW/kg (Calculated) : 82.2 HEPARIN  DW (KG): 56.7  Vital Signs: Temp: 97.7 F (36.5 C) (05/06 2310) BP: 92/72 (05/06 2310) Pulse Rate: 74 (05/06 2310)  Labs: Recent Labs    10/06/23 0434 10/06/23 1307 10/06/23 1532 10/07/23 0006 10/07/23 0316 10/07/23 0903 10/07/23 2121 10/08/23 0237  HGB 9.2*  --   --   --  9.0*  --   --  8.8*  HCT 27.6*  --   --   --  26.8*  --   --  26.8*  PLT 338  --   --   --  350  --   --  416*  APTT  --   --  40*  --   --   --   --   --   LABPROT  --   --  15.3*  --   --   --   --   --   INR  --   --  1.2  --   --   --   --   --   HEPARINUNFRC  --   --   --  0.18*  --  0.29* 0.29*  --   CREATININE 1.54*  --   --   --  1.43*  --   --  1.50*  CKTOTAL  --  13*  --   --   --   --   --   --     Estimated Creatinine Clearance: 31.5 mL/min (A) (by C-G formula based on SCr of 1.5 mg/dL (H)).   Medical History: Past Medical History:  Diagnosis Date   B12 deficiency anemia 08/03/2019   Hypercholesterolemia    Hypertension    Vertigo    last episode approx 09/2018    Medications:  Medications Prior to Admission  Medication Sig Dispense Refill Last Dose/Taking   acetaminophen  (TYLENOL ) 325 MG tablet Take 2 tablets (650 mg total) by mouth every 6 (six) hours as needed for mild pain (pain score 1-3), headache or fever (or Fever >/= 101).   Past Week   atorvastatin  (LIPITOR) 10 MG tablet Take 10 mg by mouth daily.   09/29/2023   calcium  carbonate (OS-CAL - DOSED IN MG OF ELEMENTAL CALCIUM ) 1250 (500 Ca) MG tablet Take 1 tablet (1,250 mg total) by mouth 2 (two) times daily with a meal. 30 tablet 0 09/29/2023   Multiple Vitamin (MULTIVITAMIN WITH MINERALS) TABS tablet Take 1 tablet by mouth daily.   09/29/2023   triamterene -hydrochlorothiazide (DYAZIDE) 37.5-25 MG  capsule Take 1 capsule by mouth daily.   09/29/2023   [Paused] aspirin  EC 81 MG tablet Take 81 mg by mouth at bedtime. Pt states he has resumed as of 09/26/23      Scheduled:   Chlorhexidine  Gluconate Cloth  6 each Topical Q0600   feeding supplement  237 mL Oral TID BM   multivitamin with minerals  1 tablet Oral Daily   Infusions:   heparin  1,300 Units/hr (10/07/23 2307)   meropenem  (MERREM ) IV 200 mL/hr at 10/07/23 2114   vancomycin  Stopped (10/07/23 1600)   PRN: albuterol , guaiFENesin-dextromethorphan, ondansetron  **OR** ondansetron  (ZOFRAN ) IV, sodium chloride  HYPERTONIC Anti-infectives (From admission, onward)    Start     Dose/Rate Route Frequency Ordered Stop   10/06/23 1800  meropenem  (MERREM ) 1 g in sodium chloride  0.9 %  100 mL IVPB        1 g 200 mL/hr over 30 Minutes Intravenous Every 12 hours 10/06/23 1548     10/06/23 1400  vancomycin  (VANCOREADY) IVPB 750 mg/150 mL        750 mg 150 mL/hr over 60 Minutes Intravenous Every 24 hours 10/06/23 1135     10/04/23 1400  vancomycin  (VANCOREADY) IVPB 500 mg/100 mL  Status:  Discontinued        500 mg 100 mL/hr over 60 Minutes Intravenous Every 24 hours 10/03/23 1624 10/06/23 1135   10/03/23 1800  vancomycin  (VANCOREADY) IVPB 500 mg/100 mL        500 mg 100 mL/hr over 60 Minutes Intravenous  Once 10/03/23 1624 10/03/23 1859   10/02/23 2200  linezolid  (ZYVOX ) tablet 600 mg  Status:  Discontinued        600 mg Oral Every 12 hours 10/02/23 1550 10/03/23 1501   10/01/23 0900  vancomycin  (VANCOCIN ) IVPB 1000 mg/200 mL premix  Status:  Discontinued        1,000 mg 200 mL/hr over 60 Minutes Intravenous Every 48 hours 10/01/23 0736 10/02/23 1550   10/01/23 0800  vancomycin  variable dose per unstable renal function (pharmacist dosing)  Status:  Discontinued         Does not apply See admin instructions 09/30/23 0747 10/01/23 0738   09/30/23 1000  meropenem  (MERREM ) 500 mg in sodium chloride  0.9 % 100 mL IVPB  Status:  Discontinued         500 mg 200 mL/hr over 30 Minutes Intravenous Every 12 hours 09/30/23 0728 10/02/23 1550   09/30/23 0500  ceFEPIme  (MAXIPIME ) 2 g in sodium chloride  0.9 % 100 mL IVPB        2 g 200 mL/hr over 30 Minutes Intravenous  Once 09/30/23 0458 09/30/23 0608   09/30/23 0500  metroNIDAZOLE  (FLAGYL ) IVPB 500 mg        500 mg 100 mL/hr over 60 Minutes Intravenous  Once 09/30/23 0458 09/30/23 0826   09/30/23 0500  vancomycin  (VANCOCIN ) IVPB 1000 mg/200 mL premix        1,000 mg 200 mL/hr over 60 Minutes Intravenous  Once 09/30/23 0458 09/30/23 0454       Assessment: 83 y.o. male with medical history significant of HTN, urinary retention s/p suprapubic catheter placement,  HLD. Presents to ED from home via EMS w/ weakness. CT scan: There is a moderate-sized pulmonary embolus in the distal left main pulmonary artery and extending into the proximal left upper lobe pulmonary artery without occlusion. Hgb and plt stable. No DOAC PTA.   5/6 0903 HL 0.29  5/6 2121 HL 0.29, SUBtherapeutic  Goal of Therapy:  Heparin  level 0.3-0.7 units/ml Monitor platelets by anticoagulation protocol: Yes   Plan: 5/7:  HL @ 2121 = 0.29, SUBtherapeutic  - will order heparin  850 units IV X 1 and increase drip rate to 1300 units/hr - Will recheck HL 8 hrs after rate change  Lamondre Wesche D, PharmD 10/08/2023,4:01 AM

## 2023-10-08 NOTE — Progress Notes (Signed)
 Physical Therapy Treatment Patient Details Name: Zachary [name] MRN: [id number] DOB: [birth year] Today's Date: 10/08/2023   History of Present Illness Zachary [name] is an 82yoM who comes to [facility] with acute weakness at home, s/p recent fall, no significant injury sustained. Pt still generally weak from his admission last month. PMH: HLD, HTN, CAD, menieres disease, B12 deficiency, suprapubic catheter.    PT Comments  Pt received supine in bed agreeable to PT. Reports feeling weak but eager to participate. Pt exiting bed and STS to RW at supervision level but needs increased time and bouts of momentum to complete. Pt able to ambulate 3 laps in room. Mildly impulsive likely due to being [facility] needing regular re-direction and multi modal cuing for Line/leads management. Pt completes overall ~ 45' of gait in room. Maintained in room due to new airborne precautions. Will plan for out of room ambulation next session with appropriate pt PPE for participation. Pt quick to endorse fatigue due to poor endurance requesting return to supine.  Pt with all needs in reach left in care of lab tech. D/c recs remain appropriate.    If plan is discharge home, recommend the following: A little help with walking and/or transfers;Assistance with cooking/housework;Assist for transportation;Help with stairs or ramp for entrance   Can travel by private vehicle     Yes  Equipment Recommendations  None recommended by PT    Recommendations for Other Services       Precautions / Restrictions Precautions Precautions: Fall Restrictions Weight Bearing Restrictions Per Provider Order: No     Mobility  Bed Mobility Overal bed mobility: Needs Assistance Bed Mobility: Supine to Sit, Sit to Supine     Supine to sit: HOB elevated, Used rails, Supervision Sit to supine: Supervision     Patient Response: Cooperative  Transfers Overall transfer level: Needs assistance Equipment used: Rolling walker (2  wheels) Transfers: Sit to/from Stand Sit to Stand: Supervision           General transfer comment: increased time and momtentum but no external support needed.    Ambulation/Gait Ambulation/Gait assistance: Supervision Gait Distance (Feet): 45 Feet Assistive device: Rolling walker (2 wheels) Gait Pattern/deviations: Step-through pattern       General Gait Details: VC's for limiting speed and turns in room due to IV pole. Did not go beyond room distances due to new airborne precautions   Stairs             Wheelchair Mobility     Tilt Bed Tilt Bed Patient Response: Cooperative  Modified Rankin (Stroke Patients Only)       Balance Overall balance assessment: Needs assistance Sitting-balance support: Feet supported Sitting balance-Leahy Scale: Good     Standing balance support: Reliant on assistive device for balance, During functional activity, Bilateral upper extremity supported Standing balance-Leahy Scale: Fair                              Hotel manager: Impaired Factors Affecting Communication: Hearing impaired  Cognition Arousal: Alert Behavior During Therapy: WFL for tasks assessed/performed   PT - Cognitive impairments: No apparent impairments                         Following commands: Intact      Cueing Cueing Techniques: Verbal cues  Exercises      General Comments        Pertinent Vitals/Pain Pain Assessment  Pain Assessment: No/denies pain    Home Living                          Prior Function            PT Goals (current goals can now be found in the care plan section) Acute Rehab PT Goals Patient Stated Goal: regain strength, be able to return to short community AMB PT Goal Formulation: With patient Time For Goal Achievement: 10/15/23 Potential to Achieve Goals: Fair Progress towards PT goals: Progressing toward goals    Frequency    Min 2X/week       PT Plan      Co-evaluation              AM-PAC PT "6 Clicks" Mobility   Outcome Measure  Help needed turning from your back to your side while in a flat bed without using bedrails?: A Little Help needed moving from lying on your back to sitting on the side of a flat bed without using bedrails?: A Little Help needed moving to and from a bed to a chair (including a wheelchair)?: A Little Help needed standing up from a chair using your arms (e.g., wheelchair or bedside chair)?: A Little Help needed to walk in hospital room?: A Little Help needed climbing 3-5 steps with a railing? : A Little 6 Click Score: 18    End of Session Equipment Utilized During Treatment: Gait belt Activity Tolerance: Patient tolerated treatment well Patient left: in bed;with call bell/phone within reach;with nursing/sitter in room Nurse Communication: Mobility status PT Visit Diagnosis: Difficulty in walking, not elsewhere classified (R26.2);Muscle weakness (generalized) (M62.81)     Time: 1610-9604 PT Time Calculation (min) (ACUTE ONLY): 18 min  Charges:    $Therapeutic Activity: 8-22 mins PT General Charges $$ ACUTE PT VISIT: 1 Visit                     Marc Senior. Fairly IV, PT, DPT Physical Therapist- [place]  [facility]  10/08/2023, 3:54 PM

## 2023-10-08 NOTE — Consult Note (Signed)
 PHARMACY - ANTICOAGULATION CONSULT NOTE  Pharmacy Consult for Heparin   Indication: pulmonary embolus  No Known Allergies  Patient Measurements: Height: 6\' 2"  (188 cm) Weight: 56.7 kg (125 lb) IBW/kg (Calculated) : 82.2 HEPARIN  DW (KG): 56.7  Vital Signs: Temp: 97.7 F (36.5 C) (05/07 1250) Temp Source: Oral (05/07 1250) BP: 105/63 (05/07 1250) Pulse Rate: 55 (05/07 1250)  Labs: Recent Labs    10/06/23 0434 10/06/23 1307 10/06/23 1532 10/07/23 0006 10/07/23 0316 10/07/23 0903 10/07/23 2121 10/08/23 0237 10/08/23 0713 10/08/23 1534  HGB 9.2*  --   --   --  9.0*  --   --  8.8*  --   --   HCT 27.6*  --   --   --  26.8*  --   --  26.8*  --   --   PLT 338  --   --   --  350  --   --  416*  --   --   APTT  --   --  40*  --   --   --   --   --   --   --   LABPROT  --   --  15.3*  --   --   --   --   --   --   --   INR  --   --  1.2  --   --   --   --   --   --   --   HEPARINUNFRC  --   --   --    < >  --    < > 0.29*  --  0.60 0.48  CREATININE 1.54*  --   --   --  1.43*  --   --  1.50*  --   --   CKTOTAL  --  13*  --   --   --   --   --   --   --   --    < > = values in this interval not displayed.    Estimated Creatinine Clearance: 31.5 mL/min (A) (by C-G formula based on SCr of 1.5 mg/dL (H)).   Medical History: Past Medical History:  Diagnosis Date   B12 deficiency anemia 08/03/2019   Hypercholesterolemia    Hypertension    Vertigo    last episode approx 09/2018    Medications:  Medications Prior to Admission  Medication Sig Dispense Refill Last Dose/Taking   acetaminophen  (TYLENOL ) 325 MG tablet Take 2 tablets (650 mg total) by mouth every 6 (six) hours as needed for mild pain (pain score 1-3), headache or fever (or Fever >/= 101).   Past Week   atorvastatin  (LIPITOR) 10 MG tablet Take 10 mg by mouth daily.   09/29/2023   calcium  carbonate (OS-CAL - DOSED IN MG OF ELEMENTAL CALCIUM ) 1250 (500 Ca) MG tablet Take 1 tablet (1,250 mg total) by mouth 2 (two) times  daily with a meal. 30 tablet 0 09/29/2023   Multiple Vitamin (MULTIVITAMIN WITH MINERALS) TABS tablet Take 1 tablet by mouth daily.   09/29/2023   triamterene -hydrochlorothiazide (DYAZIDE) 37.5-25 MG capsule Take 1 capsule by mouth daily.   09/29/2023   [Paused] aspirin  EC 81 MG tablet Take 81 mg by mouth at bedtime. Pt states he has resumed as of 09/26/23      Scheduled:   Chlorhexidine  Gluconate Cloth  6 each Topical Q0600   feeding supplement  237 mL Oral TID BM   multivitamin with  minerals  1 tablet Oral Daily   Infusions:   heparin  1,300 Units/hr (10/08/23 1001)   meropenem  (MERREM ) IV 1 g (10/08/23 0928)   vancomycin  750 mg (10/08/23 1416)   PRN: albuterol , guaiFENesin-dextromethorphan, menthol-cetylpyridinium, ondansetron  **OR** ondansetron  (ZOFRAN ) IV, sodium chloride  HYPERTONIC Anti-infectives (From admission, onward)    Start     Dose/Rate Route Frequency Ordered Stop   10/06/23 1800  meropenem  (MERREM ) 1 g in sodium chloride  0.9 % 100 mL IVPB        1 g 200 mL/hr over 30 Minutes Intravenous Every 12 hours 10/06/23 1548     10/06/23 1400  vancomycin  (VANCOREADY) IVPB 750 mg/150 mL        750 mg 150 mL/hr over 60 Minutes Intravenous Every 24 hours 10/06/23 1135     10/04/23 1400  vancomycin  (VANCOREADY) IVPB 500 mg/100 mL  Status:  Discontinued        500 mg 100 mL/hr over 60 Minutes Intravenous Every 24 hours 10/03/23 1624 10/06/23 1135   10/03/23 1800  vancomycin  (VANCOREADY) IVPB 500 mg/100 mL        500 mg 100 mL/hr over 60 Minutes Intravenous  Once 10/03/23 1624 10/03/23 1859   10/02/23 2200  linezolid  (ZYVOX ) tablet 600 mg  Status:  Discontinued        600 mg Oral Every 12 hours 10/02/23 1550 10/03/23 1501   10/01/23 0900  vancomycin  (VANCOCIN ) IVPB 1000 mg/200 mL premix  Status:  Discontinued        1,000 mg 200 mL/hr over 60 Minutes Intravenous Every 48 hours 10/01/23 0736 10/02/23 1550   10/01/23 0800  vancomycin  variable dose per unstable renal function (pharmacist  dosing)  Status:  Discontinued         Does not apply See admin instructions 09/30/23 0747 10/01/23 0738   09/30/23 1000  meropenem  (MERREM ) 500 mg in sodium chloride  0.9 % 100 mL IVPB  Status:  Discontinued        500 mg 200 mL/hr over 30 Minutes Intravenous Every 12 hours 09/30/23 0728 10/02/23 1550   09/30/23 0500  ceFEPIme  (MAXIPIME ) 2 g in sodium chloride  0.9 % 100 mL IVPB        2 g 200 mL/hr over 30 Minutes Intravenous  Once 09/30/23 0458 09/30/23 0608   09/30/23 0500  metroNIDAZOLE  (FLAGYL ) IVPB 500 mg        500 mg 100 mL/hr over 60 Minutes Intravenous  Once 09/30/23 0458 09/30/23 0826   09/30/23 0500  vancomycin  (VANCOCIN ) IVPB 1000 mg/200 mL premix        1,000 mg 200 mL/hr over 60 Minutes Intravenous  Once 09/30/23 0458 09/30/23 1610      Assessment: 83 y.o. male with medical history significant of hypertension, urinary retention s/p suprapubic catheter placement, hyperlipidemia. Presents to ED from home via EMS w/ weakness. CT scan: There is a moderate-sized pulmonary embolus in the distal left main pulmonary artery and extending into the proximal left upper lobe pulmonary artery without occlusion.  PTA medications: no DOAC  10/08/23 CBC: Hbg 8.8 (baseline 9-10), admission platelets 416.  5/6 0903 HL 0.29  5/6 2121 HL 0.29, SUBtherapeutic 5/7 0713 HL 0.60  therapeutic x 1 5/7 1534 HL 0.48, therapeutic x2  Goal of Therapy:  Heparin  level 0.3-0.7 units/ml Monitor platelets by anticoagulation protocol: Yes   Plan: Heparin  level is therapeutic x2 Continue heparin  infusion at 1300 units/hr, per cards note 5/7, plan is to continue heparin  Check heparin  level in AM - monitor heparin  levels  daily while on heparin  infusion CBC daily while on heparin   Thank you for involving pharmacy in this patient's care.   Ananias Balls, PharmD Clinical Pharmacist 10/08/2023 3:59 PM

## 2023-10-08 NOTE — Consult Note (Signed)
 PHARMACY - ANTICOAGULATION CONSULT NOTE  Pharmacy Consult for Heparin   Indication: pulmonary embolus  No Known Allergies  Patient Measurements: Height: 6\' 2"  (188 cm) Weight: 56.7 kg (125 lb) IBW/kg (Calculated) : 82.2 HEPARIN  DW (KG): 56.7  Vital Signs: Temp: 97.8 F (36.6 C) (05/07 0807) BP: 101/61 (05/07 0807) Pulse Rate: 78 (05/07 0807)  Labs: Recent Labs    10/06/23 0434 10/06/23 1307 10/06/23 1532 10/07/23 0006 10/07/23 0316 10/07/23 0903 10/07/23 2121 10/08/23 0237 10/08/23 0713  HGB 9.2*  --   --   --  9.0*  --   --  8.8*  --   HCT 27.6*  --   --   --  26.8*  --   --  26.8*  --   PLT 338  --   --   --  350  --   --  416*  --   APTT  --   --  40*  --   --   --   --   --   --   LABPROT  --   --  15.3*  --   --   --   --   --   --   INR  --   --  1.2  --   --   --   --   --   --   HEPARINUNFRC  --   --   --    < >  --  0.29* 0.29*  --  0.60  CREATININE 1.54*  --   --   --  1.43*  --   --  1.50*  --   CKTOTAL  --  13*  --   --   --   --   --   --   --    < > = values in this interval not displayed.    Estimated Creatinine Clearance: 31.5 mL/min (A) (by C-G formula based on SCr of 1.5 mg/dL (H)).   Medical History: Past Medical History:  Diagnosis Date   B12 deficiency anemia 08/03/2019   Hypercholesterolemia    Hypertension    Vertigo    last episode approx 09/2018    Medications:  Medications Prior to Admission  Medication Sig Dispense Refill Last Dose/Taking   acetaminophen  (TYLENOL ) 325 MG tablet Take 2 tablets (650 mg total) by mouth every 6 (six) hours as needed for mild pain (pain score 1-3), headache or fever (or Fever >/= 101).   Past Week   atorvastatin  (LIPITOR) 10 MG tablet Take 10 mg by mouth daily.   09/29/2023   calcium  carbonate (OS-CAL - DOSED IN MG OF ELEMENTAL CALCIUM ) 1250 (500 Ca) MG tablet Take 1 tablet (1,250 mg total) by mouth 2 (two) times daily with a meal. 30 tablet 0 09/29/2023   Multiple Vitamin (MULTIVITAMIN WITH MINERALS) TABS  tablet Take 1 tablet by mouth daily.   09/29/2023   triamterene -hydrochlorothiazide (DYAZIDE) 37.5-25 MG capsule Take 1 capsule by mouth daily.   09/29/2023   [Paused] aspirin  EC 81 MG tablet Take 81 mg by mouth at bedtime. Pt states he has resumed as of 09/26/23      Scheduled:   Chlorhexidine  Gluconate Cloth  6 each Topical Q0600   feeding supplement  237 mL Oral TID BM   multivitamin with minerals  1 tablet Oral Daily   Infusions:   heparin  1,300 Units/hr (10/08/23 0526)   meropenem  (MERREM ) IV Stopped (10/07/23 2139)   vancomycin  Stopped (10/07/23 1600)   PRN: albuterol , guaiFENesin-dextromethorphan,  ondansetron  **OR** ondansetron  (ZOFRAN ) IV, sodium chloride  HYPERTONIC Anti-infectives (From admission, onward)    Start     Dose/Rate Route Frequency Ordered Stop   10/06/23 1800  meropenem  (MERREM ) 1 g in sodium chloride  0.9 % 100 mL IVPB        1 g 200 mL/hr over 30 Minutes Intravenous Every 12 hours 10/06/23 1548     10/06/23 1400  vancomycin  (VANCOREADY) IVPB 750 mg/150 mL        750 mg 150 mL/hr over 60 Minutes Intravenous Every 24 hours 10/06/23 1135     10/04/23 1400  vancomycin  (VANCOREADY) IVPB 500 mg/100 mL  Status:  Discontinued        500 mg 100 mL/hr over 60 Minutes Intravenous Every 24 hours 10/03/23 1624 10/06/23 1135   10/03/23 1800  vancomycin  (VANCOREADY) IVPB 500 mg/100 mL        500 mg 100 mL/hr over 60 Minutes Intravenous  Once 10/03/23 1624 10/03/23 1859   10/02/23 2200  linezolid  (ZYVOX ) tablet 600 mg  Status:  Discontinued        600 mg Oral Every 12 hours 10/02/23 1550 10/03/23 1501   10/01/23 0900  vancomycin  (VANCOCIN ) IVPB 1000 mg/200 mL premix  Status:  Discontinued        1,000 mg 200 mL/hr over 60 Minutes Intravenous Every 48 hours 10/01/23 0736 10/02/23 1550   10/01/23 0800  vancomycin  variable dose per unstable renal function (pharmacist dosing)  Status:  Discontinued         Does not apply See admin instructions 09/30/23 0747 10/01/23 0738    09/30/23 1000  meropenem  (MERREM ) 500 mg in sodium chloride  0.9 % 100 mL IVPB  Status:  Discontinued        500 mg 200 mL/hr over 30 Minutes Intravenous Every 12 hours 09/30/23 0728 10/02/23 1550   09/30/23 0500  ceFEPIme  (MAXIPIME ) 2 g in sodium chloride  0.9 % 100 mL IVPB        2 g 200 mL/hr over 30 Minutes Intravenous  Once 09/30/23 0458 09/30/23 0608   09/30/23 0500  metroNIDAZOLE  (FLAGYL ) IVPB 500 mg        500 mg 100 mL/hr over 60 Minutes Intravenous  Once 09/30/23 0458 09/30/23 0826   09/30/23 0500  vancomycin  (VANCOCIN ) IVPB 1000 mg/200 mL premix        1,000 mg 200 mL/hr over 60 Minutes Intravenous  Once 09/30/23 0458 09/30/23 1610       Assessment: 83 y.o. male with medical history significant of hypertension, urinary retention s/p suprapubic catheter placement, hyperlipidemia. Presents to ED from home via EMS w/ weakness. CT scan: There is a moderate-sized pulmonary embolus in the distal left main pulmonary artery and extending into the proximal left upper lobe pulmonary artery without occlusion.  PTA medications: no DOAC  10/08/23 CBC: Hbg 8.8 (baseline 9-10), admission platelets 416.  5/6 0903 HL 0.29  5/6 2121 HL 0.29, SUBtherapeutic 5/7 0713 HL 0.60  therapeutic x 1  Goal of Therapy:  Heparin  level 0.3-0.7 units/ml Monitor platelets by anticoagulation protocol: Yes   Plan: --Continue heparin  infusion at 1300 units/hr, per cards note 5/7, plan is to continue heparin  --Will recheck HL 8 hrs to confirm --CBC daily while on heparin   Otha Blight, PharmD Candidate 10/08/2023,9:21 AM

## 2023-10-08 NOTE — Progress Notes (Signed)
 Date of Admission:  09/30/2023      ID: Zachary [name] is a 83 y.o. male  Principal Problem:   Sepsis (HCC) Active Problems:   Mixed hyperlipidemia   Essential hypertension   Acute kidney injury superimposed on chronic kidney disease (HCC)   Pneumonia   UTI (urinary tract infection)   Acute encephalopathy   CKD (chronic kidney disease) stage 4, GFR 15-29 ml/min (HCC)   NSTEMI (non-ST elevated myocardial infarction) (HCC)   Protein-calorie malnutrition, severe   MRSA bacteremia   Cavitary pneumonia    Subjective: Pt is feeling okay No cough or sob  Medications:   Chlorhexidine  Gluconate Cloth  6 each Topical Q0600   feeding supplement  237 mL Oral TID BM   multivitamin with minerals  1 tablet Oral Daily    Objective: Vital signs in last 24 hours: Patient Vitals for the past 24 hrs:  BP Temp Temp src Pulse Resp SpO2  10/08/23 1500 102/65 98 F (36.7 C) Oral 63 18 100 %  10/08/23 1250 105/63 97.7 F (36.5 C) Oral (!) 55 18 99 %  10/08/23 0807 101/61 97.8 F (36.6 C) -- 78 18 98 %  10/08/23 0524 91/61 97.8 F (36.6 C) -- 73 20 99 %  10/07/23 2310 92/72 97.7 F (36.5 C) -- 74 18 100 %  10/07/23 1956 (!) 93/59 98 F (36.7 C) -- (!) 55 20 100 %      PHYSICAL EXAM:  General: Alert, cooperative, no distress, emaciated Lungs: b/l air entry- crepts rt side Heart: Regular rate and rhythm, no murmur, rub or gallop. Abdomen: Soft, suprapubic catheter present Extremities: atraumatic, no cyanosis. No edema. No clubbing Skin: No rashes or lesions. Or bruising Lymph: Cervical, supraclavicular normal. Neurologic: Grossly non-focal  Lab Results    Latest Ref Rng & Units 10/08/2023    2:37 AM 10/07/2023    3:16 AM 10/06/2023    4:34 AM  CBC  WBC 4.0 - 10.5 K/uL 20.3  20.8  19.8   Hemoglobin 13.0 - 17.0 g/dL 8.8  9.0  9.2   Hematocrit 39.0 - 52.0 % 26.8  26.8  27.6   Platelets 150 - 400 K/uL 416  350  338        Latest Ref Rng & Units 10/08/2023    2:37 AM 10/07/2023     2:47 PM 10/07/2023    3:16 AM  CMP  Glucose 70 - 99 mg/dL 93   213   BUN 8 - 23 mg/dL 52   53   Creatinine 0.86 - 1.24 mg/dL 5.78   4.69   Sodium 629 - 145 mmol/L 135   132   Potassium 3.5 - 5.1 mmol/L 4.3   4.3   Chloride 98 - 111 mmol/L 107   102   CO2 22 - 32 mmol/L 20   21   Calcium  8.9 - 10.3 mg/dL 7.8   8.0   Total Protein 6.5 - 8.1 g/dL 4.8   4.9   Total Bilirubin 0.0 - 1.2 mg/dL 0.8  0.9  0.8   Alkaline Phos 38 - 126 U/L 189   219   AST 15 - 41 U/L 57   94   ALT 0 - 44 U/L 105   135       Microbiology: 09/30/2023. 1 out of 2 sets blood culture MRSA 09/30/2023 urine culture ESBL E. coli and Enterococcus  Studies/Results: DG Chest Port 1 View Result Date: 10/08/2023 CLINICAL DATA:  Status post right thoracentesis.  EXAM: PORTABLE CHEST 1 VIEW COMPARISON:  Oct 07, 2023. FINDINGS: No definite pneumothorax is noted status post thoracentesis. No significant residual pleural effusion is noted. As described on prior studies. IMPRESSION: No definite pneumothorax status post thoracentesis. Electronically Signed   By: Rosalene Colon M.D.   On: 10/08/2023 13:18   US  THORACENTESIS ASP PLEURAL SPACE W/IMG GUIDE Result Date: 10/08/2023 INDICATION: 83 year old male with MRSA pneumonia, lung empyema, bilateral pleural effusions. IR was requested for diagnostic and therapeutic right-sided thoracentesis. EXAM: ULTRASOUND GUIDED DIAGNOSTIC AND THERAPEUTIC THORACENTESIS MEDICATIONS: 4 cc of 1% lidocaine  COMPLICATIONS: None immediate. PROCEDURE: An ultrasound guided thoracentesis was thoroughly discussed with the patient and questions answered. The benefits, risks, alternatives and complications were also discussed. The patient understands and wishes to proceed with the procedure. Written consent was obtained. Ultrasound was performed to localize and mark an adequate pocket of fluid in the right chest. The area was then prepped and draped in the normal sterile fashion. 1% Lidocaine  was used for local  anesthesia. Under ultrasound guidance a 6 Fr Safe-T-Centesis catheter was introduced. Thoracentesis was performed. The catheter was removed and a dressing applied. FINDINGS: A total of approximately 300 cc of clear, straw-colored fluid fluid was removed. Samples were sent to the laboratory as requested by the clinical team. IMPRESSION: Successful ultrasound guided right thoracentesis yielding 300 cc of pleural fluid. Procedure performed by Lambert Pillion, PA-C Electronically Signed   By: Fernando Hoyer M.D.   On: 10/08/2023 12:27   ECHO TEE Result Date: 10/07/2023    TRANSESOPHOGEAL ECHO REPORT   Patient Name:   Zachary [name] Date of Exam: 10/07/2023 Medical Rec #:  [id number]           Height:       74.0 in Accession #:    [id number]          Weight:       125.0 lb Date of Birth:  Dec 02, 1940           BSA:          1.780 m Patient Age:    82 years            BP:           95/62 mmHg Patient Gender: M                   HR:           51 bpm. Exam Location:  [facility] Procedure: Transesophageal Echo, Cardiac Doppler and Color Doppler (Both            Spectral and Color Flow Doppler were utilized during procedure). Indications:     Not listed on TEE check-in sheet  History:         Patient has no prior history of Echocardiogram examinations.                  Risk Factors:Hypertension.  Sonographer:     Broadus Canes Referring Phys:  [id number] CARALYN HUDSON Diagnosing Phys: Joetta Mustache PROCEDURE: After discussion of the risks and benefits of a TEE, an informed consent was obtained from the patient. The transesophogeal probe was passed without difficulty through the esophogus of the patient. Local oropharyngeal anesthetic was provided with Cetacaine. Sedation performed by different physician. The patient was monitored while under deep sedation. Image quality was good. The patient's vital signs; including heart rate, blood pressure, and oxygen  saturation; remained stable throughout the  procedure. The patient developed  no complications during the  procedure.  IMPRESSIONS  1. Left ventricular ejection fraction, by estimation, is 35 to 40%. The left ventricle has moderately decreased function.  2. Right ventricular systolic function is mildly reduced. The right ventricular size is normal.  3. Left atrial size was severely dilated. No left atrial/left atrial appendage thrombus was detected.  4. The mitral valve is normal in structure. Mild mitral valve regurgitation.  5. Low flow low gradient aortic stenosis, moderate. The aortic valve is tricuspid. Aortic valve regurgitation is mild to moderate. Conclusion(s)/Recommendation(s): No evidence of vegetation/infective endocarditis on this transesophageael echocardiogram. FINDINGS  Left Ventricle: Left ventricular ejection fraction, by estimation, is 35 to 40%. The left ventricle has moderately decreased function. The left ventricular internal cavity size was normal in size. Right Ventricle: The right ventricular size is normal. No increase in right ventricular wall thickness. Right ventricular systolic function is mildly reduced. Left Atrium: Left atrial size was severely dilated. No left atrial/left atrial appendage thrombus was detected. Right Atrium: Right atrial size was normal in size. Pericardium: There is no evidence of pericardial effusion. Mitral Valve: The mitral valve is normal in structure. Mild mitral valve regurgitation. Tricuspid Valve: The tricuspid valve is normal in structure. Tricuspid valve regurgitation is mild. Aortic Valve: Low flow low gradient aortic stenosis, moderate. The aortic valve is tricuspid. Aortic valve regurgitation is mild to moderate. Aortic valve mean gradient measures 11.5 mmHg. Aortic valve peak gradient measures 20.9 mmHg. Aortic valve area,  by VTI measures 1.40 cm. Pulmonic Valve: The pulmonic valve was normal in structure. Pulmonic valve regurgitation is trivial. Aorta: The aortic root is normal in size and structure. IAS/Shunts: No atrial  level shunt detected by color flow Doppler.  LEFT VENTRICLE PLAX 2D LVOT diam:     2.60 cm LV SV:         56 LV SV Index:   32 LVOT Area:     5.31 cm  AORTIC VALVE AV Area (Vmax):    1.46 cm AV Area (Vmean):   1.34 cm AV Area (VTI):     1.40 cm AV Vmax:           228.50 cm/s AV Vmean:          155.000 cm/s AV VTI:            0.402 m AV Peak Grad:      20.9 mmHg AV Mean Grad:      11.5 mmHg LVOT Vmax:         62.70 cm/s LVOT Vmean:        39.200 cm/s LVOT VTI:          0.106 m LVOT/AV VTI ratio: 0.26  SHUNTS Systemic VTI:  0.11 m Systemic Diam: 2.60 cm Joetta Mustache Electronically signed by Joetta Mustache Signature Date/Time: 10/07/2023/2:00:53 PM    Final    DG Chest Port 1 View Result Date: 10/07/2023 CLINICAL DATA:  Weakness and fever.  Pleural effusion. EXAM: PORTABLE CHEST 1 VIEW COMPARISON:  CT chest, abdomen, and pelvis dated 10/06/2023. Chest radiograph dated 09/30/2023. FINDINGS: The heart size and mediastinal contours are unchanged. Aortic atherosclerosis. Large cavitary lesion is again noted in the right lung with surrounding airspace opacities. Similar small bilateral pleural effusions with associated bibasilar atelectasis. Unchanged large hiatal hernia. No pneumothorax. No acute osseous abnormality. IMPRESSION: 1. Large cavitary lesion is again noted in the right lung with surrounding airspace opacities, concerning for cavitary pneumonia, better evaluated on the prior CT chest, abdomen, and pelvis dated 10/06/2023. 2. Similar small bilateral  pleural effusions with associated bibasilar atelectasis. Electronically Signed   By: Mannie Seek M.D.   On: 10/07/2023 12:42      Assessment/Plan: MRSA bacteremia Likely source MRSA cavitary pneumonia Patient was on vancomycin  and then changed to linezolid  and now back on vancomycin . Repeat blood culture no growth TEE no vegetations  Cavitary pneumonia on the right side with emphysema MRSA nares positive.  Seen by pulmonary and they want to  rule out tuberculosis Patient has to be in airborne isolation to collect AFB Sputum now has staph aureus as well Likely this is the cause of the necrotizing cavitary pneumonia- will change vanco to linezolid  for better lung penetration But because of patient's 40 pound weight loss afb being sent   Worsening leucocytosis  Abnormal LFTS.  Increasing transaminases and alkaline phosphatase  Anemia  Weight loss of 40 pounds  History of urinary retention has suprapubic catheter ESBL E. coli and Enterococcus in urine culture.  On Opana History of obstructive uropathy Bladder diverticulum  AKI on CKD improving    Discussed the management with the patient and the care team

## 2023-10-08 NOTE — Progress Notes (Signed)
 Palliative Care Progress Note, Assessment & Plan   Patient Name: Zachary [name]       Date: 10/08/2023 DOB: 1940-12-04  Age: 83 y.o. MRN#: [id number] Attending Physician: Garrison Kanner, MD Primary Care Physician: [name], MD Admit Date: 09/30/2023  Subjective: Patient is lying in bed with lab tech at bedside collecting blood sample.  RN also at bedside.  Patient has a weak, nonproductive cough.  He is just returned from thoracentesis.  He complains of a cough that will not stop.  No family or friends present at bedside.  HPI: Zachary [name] is a 83 y.o. male with medical history significant of HTN, urinary retention s/p suprapubic catheter placement,  HLD. Presents to ED from home via EMS w/ weakness.    Of note, hospitalized last month 03/04-03/07/25 w/ substantial urinary retention and AKI, Foley placed, f/u urology and suprapubic cath was placed 08/26/23. Also notable, has been following w/ PCP last visit 09/24/23 concern for unintentional weight loss, SPEP --> pending. Has been following w/ hematology last visit 09/26/23 for anemia and lung nodule, flow cytometry --> neg, CT chest --> pending    04/29: admitted to hospitalist service w/ sepsis, pna, uti, encephalopathy. ICU consult but he did not need pressors 04/30: await cultures, CT chest to eval PNA/other RML. Alert today, baseline mentation.  05/01: CT (+)cavitary pneumonia. 1 blood culture (+)MRSA.  Patient was requesting to leave AMA, see progress note and IPAL note.  Will stay at least through tonight.  ID considering for p.o. antibiotics in case patient is wanting to go home tomorrow 05/02: pt has reconsidered for treatment, he states he "made a mistake" yesterday and is now open to continuing IV antibiotics and going ot rehab  hopefully same facility as his wife will be going to post-op. Palliative to follow either way.  05/03-05/04: repeat BCx negative thus far, continue current IV abx, placement to follow after the weekend  05/05: liver enzymes continue to elevate as well as alk phos, WBC also worsening, pt reports more fatigued today, coughing more toady/yesterday, but no other symptoms to explain findings, CT chest/abd/pelv --> d/w radiologist, (+)PE nonocclusive and no apparent RV strain, worsening cavitary lung lesion w/u as below and pulm consult, consult w/ cardio re: TEE vs TTE given bacteremia and now PE, started heparin  and dc lovenox . Spoke w/ CT surgery, not a candidate, they recommend pulm to consider for bronch if able.  05/06: TEE - no vegetation, TTE pending to fully eval EF. Continuing IV abx and await cultures.   Summary of counseling/coordination of care: Extensive chart review completed prior to meeting patient including labs, vital signs, imaging, progress notes, orders, and available advanced directive documents from current and previous encounters.   After reviewing the patient's chart and assessing the patient at bedside, I spoke with patient in regards to symptom management and goals of care.   Symptoms assessed.  Patient endorses weak cough that is not productive.  He denies that it is causing him pain or radiating.  He shares it started after thoracentesis.  Reviewed MAR.  Patient has Robitussin available.  I also added Cepacol lozenges and advised patient to utilize either to help with cough.  However, advised that we are not trying to stop patient's cough in case there is phlegm/secretions that need to be cleared from his airway.  He endorsed understanding.  Counseled with RN.  No other acute palliative needs at this time.  PMT will continue to follow and support.  Physical Exam Constitutional:      Appearance: He is normal weight.  HENT:     Head: Normocephalic.     Nose: Nose normal.      Mouth/Throat:     Mouth: Mucous membranes are moist.  Eyes:     Pupils: Pupils are equal, round, and reactive to light.  Pulmonary:     Comments: cough Abdominal:     Palpations: Abdomen is soft.  Skin:    General: Skin is warm and dry.  Neurological:     Mental Status: He is alert and oriented to person, place, and time.  Psychiatric:        Mood and Affect: Mood normal.        Behavior: Behavior normal.        Thought Content: Thought content normal.        Judgment: Judgment normal.             Total Time 35 minutes   Time spent includes: Detailed review of medical records (labs, imaging, vital signs), medically appropriate exam (mental status, respiratory, cardiac, skin), discussed with treatment team, counseling and educating patient, family and staff, documenting clinical information, medication management and coordination of care.  Judeen Nose L. Rebbeca Campi, DNP, FNP-BC Palliative Medicine Team

## 2023-10-08 NOTE — Progress Notes (Signed)
 HPI Zachary [name] is an 83 year old male patient with a past medical history of hypertension, urinary retention status post suprapubic cath on 08/26/2023 who presented to [facility] initially on 0/30 with lethargy, encephalopathy and signs of sepsis.  His suprapubic catheter was exchanged in the ED.  Urine culture grew ESBL E. coli for which she received 3 days of meropenem  that was stopped.  His CT chest showed a right upper lobe and right middle lobe cavitary lung lesion with mediastinal lymphadenopathy.  He was also found to have MRSA bacteremia for which he was started on vancomycin .   He reports about 4 weeks of weight loss and lethargy.  However he denies any respiratory symptoms including cough sputum production shortness of breath chest tightness chest pain or wheezing.   He had a CT chest in 2023 that showed a 1 cm left lower lobe groundglass nodule.  He underwent a CT abdomen pelvis in March 2025 that showed a 1 cm right middle lobe nodule that was new from prior.  The upper parts of the lungs were not captured.   His most recent CT chest shows an increase in size and confluence of the large cavitary lesion.  This was not present on his CT abdomen and pelvis in March.  TEE 10/07/2024 without any signs of endocarditis.   Quant negative and AFB smear negative.    Family history -denies any family history of pulmonary diseases.   Social history -never smoker.   Subjective  Has no major complaints this am. Denies any shortness of breath, chest pain, fevers or chills.   WBC is stable at 20.   Physical exam GEN no acute distress, frail HEENT supple neck, reactive pupils Lungs diminished over the right hemithorax CVS normal S1, normal S2, regular rate and rhythm Abdomen soft nontender nondistended positive bowel sound Extremities warm well-perfused no edema.   Labs and imaging were reviewed.   Assessment and Plan Zachary [name] is an 83 year old male patient with a past medical history  of urinary retention status post suprapubic catheter on 08/26/2023 who presented to [facility] on 04/30 with lethargy.  He was found to have MRSA bacteremia and a cavitary lung lesion.   His repeat CT scan compared to 5 days ago shows worsening cavitary lung lesion.  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  Less likely to be malignancy however an underlying malignant process cannot be ruled out.  But I think right now our focus should be on treating this necrotizing pneumonia that is getting worse and now with uptrending white count.  He will need follow up CT chest in 4 weeks to determine need for biopsy.   Bedside ultrasound this am with enlarging non complex right parapnemonic effusion. Recommend IR Thoracentesis for Cell count, LDH, TP and fluid culture to rule out empyema.     I do not see a role for bronchoscopy at this point however if we cannot obtain an induced sputum and his underlying micro diagnosis is still in question we can obtain a bronchoscopy for BAL to try to isolate any additional bug.    Finally is Quanteferon gold is negative, AFB smear is negative and there is low suspicon for TB with an alternative explanation of his necrotizing pneumonia, can come off airborne precautions and no further eval warranted.   I spent 50 minutes caring for this patient today, including preparing to see the patient, obtaining a medical history , reviewing a separately obtained history, performing  a medically appropriate examination and/or evaluation, counseling and educating the patient/family/caregiver, documenting clinical information in the electronic health record, and independently interpreting results (not separately reported/billed) and communicating results to the patient/family/caregiver  Annitta Kindler, MD [facility] 10/08/2023 3:26 PM

## 2023-10-08 NOTE — Procedures (Addendum)
 PROCEDURE SUMMARY:  Successful image-guided right-sided diagnostic and therapeutic thoracentesis. Yielded 300 mL of clear, straw-colored fluid. Patient tolerated procedure well. EBL: Zero No immediate complications.  Specimen was sent for labs. Post procedure CXR shows no pneumothorax on preliminary read by IR attending. Final report pending.  Please see imaging section of Epic for full dictation.  Lugene Kielbasa Jenika Chiem PA-C 10/08/2023 12:20 PM

## 2023-10-08 NOTE — Progress Notes (Signed)
 [facility] CLINIC CARDIOLOGY PROGRESS NOTE       Patient ID: TRENNON SAUCERMAN MRN: [id number] DOB/AGE: 06/17/1940 83 y.o.  Admit date: 09/30/2023 Referring Physician Dr. Melodi Sprung Primary Physician [name], MD  Primary Cardiologist Dr. Braxton Calico Reason for Consultation MRSA bacteremia, need for TEE  HPI: ARLIS GAYLER is a 83 y.o. male  with a past medical history of hypertension, hyperlipidemia, urinary retention s/p suprapubic catheter placement who presented to the ED on 09/30/2023 for weakness. Found to be septic, blood cx positive for MRSA. Cardiology was consulted for further evaluation.   Interval history: -Patient seem and examined this AM, resting comfortably in bed. States he is doing well overall.  -No complaints of SOB or CP. Remains on room air.  -Cr, Hgb, PLT stable on AM labs.  -TEE results discussed again with patient this AM.    Review of systems complete and found to be negative unless listed above    Past Medical History:  Diagnosis Date   B12 deficiency anemia 08/03/2019   Hypercholesterolemia    Hypertension    Vertigo    last episode approx 09/2018    Past Surgical History:  Procedure Laterality Date   APPENDECTOMY     CATARACT EXTRACTION Bilateral    COLONOSCOPY WITH PROPOFOL  N/A 12/24/2018   Procedure: COLONOSCOPY WITH PROPOFOL ;  Surgeon: Marnee Sink, MD;  Location: [facility] ENDOSCOPY;  Service: Endoscopy;  Laterality: N/A;   COLONOSCOPY WITH PROPOFOL  N/A 12/13/2019   Procedure: COLONOSCOPY WITH BIOPSY ;  Surgeon: Marnee Sink, MD;  Location: [facility];  Service: Endoscopy;  Laterality: N/A;  priority 3   IR CYSTOSTOMY TUBE PLACEMENT/BLADDER ASPIRATION  08/26/2023   PLEURAL SCARIFICATION  05/2015   POLYPECTOMY N/A 12/13/2019   Procedure: POLYPECTOMY;  Surgeon: Marnee Sink, MD;  Location: [facility];  Service: Endoscopy;  Laterality: N/A;    Medications Prior to Admission  Medication Sig Dispense Refill Last  Dose/Taking   acetaminophen  (TYLENOL ) 325 MG tablet Take 2 tablets (650 mg total) by mouth every 6 (six) hours as needed for mild pain (pain score 1-3), headache or fever (or Fever >/= 101).   Past Week   atorvastatin  (LIPITOR) 10 MG tablet Take 10 mg by mouth daily.   09/29/2023   calcium  carbonate (OS-CAL - DOSED IN MG OF ELEMENTAL CALCIUM ) 1250 (500 Ca) MG tablet Take 1 tablet (1,250 mg total) by mouth 2 (two) times daily with a meal. 30 tablet 0 09/29/2023   Multiple Vitamin (MULTIVITAMIN WITH MINERALS) TABS tablet Take 1 tablet by mouth daily.   09/29/2023   triamterene -hydrochlorothiazide (DYAZIDE) 37.5-25 MG capsule Take 1 capsule by mouth daily.   09/29/2023   [Paused] aspirin  EC 81 MG tablet Take 81 mg by mouth at bedtime. Pt states he has resumed as of 09/26/23      Social History   Socioeconomic History   Marital status: Married    Spouse name: Not on file   Number of children: 0   Years of education: Not on file   Highest education level: 12th grade  Occupational History   Occupation: retired  Tobacco Use   Smoking status: Never   Smokeless tobacco: Never  Vaping Use   Vaping status: Never Used  Substance and Sexual Activity   Alcohol  use: No    Alcohol /week: 0.0 standard drinks of alcohol    Drug use: No   Sexual activity: Not on file  Other Topics Concern   Not on file  Social History Narrative   Lives  with [name], wife.    Social Drivers of Corporate investment banker Strain: Low Risk  (09/25/2021)   Overall Financial Resource Strain (CARDIA)    Difficulty of Paying Living Expenses: Not hard at all  Food Insecurity: No Food Insecurity (09/30/2023)   Hunger Vital Sign    Worried About Running Out of Food in the Last Year: Never true    Ran Out of Food in the Last Year: Never true  Transportation Needs: No Transportation Needs (09/30/2023)   PRAPARE - Administrator, Civil Service (Medical): No    Lack of Transportation (Non-Medical): No  Physical Activity:  Insufficiently Active (09/25/2021)   Exercise Vital Sign    Days of Exercise per Week: 2 days    Minutes of Exercise per Session: 60 min  Stress: No Stress Concern Present (11/24/2019)   [name]    Feeling of Stress : Not at all  Social Connections: Moderately Isolated (09/30/2023)   Social Connection and Isolation Panel [NHANES]    Frequency of Communication with Friends and Family: Twice a week    Frequency of Social Gatherings with Friends and Family: More than three times a week    Attends Religious Services: Never    Database administrator or Organizations: No    Attends Banker Meetings: Never    Marital Status: Married  Catering manager Violence: Not At Risk (09/30/2023)   Humiliation, Afraid, Rape, and Kick questionnaire    Fear of Current or Ex-Partner: No    Emotionally Abused: No    Physically Abused: No    Sexually Abused: No    Family History  Problem Relation Age of Onset   Hypertension Mother    Hyperlipidemia Mother    Heart attack Father    Hypertension Father    CVA Father    ALS Brother    Prostate cancer Brother      Vitals:   10/07/23 1956 10/07/23 2310 10/08/23 0524 10/08/23 0807  BP: (!) 93/59 92/72 91/61  101/61  Pulse: (!) 55 74 73 78  Resp: 20 18 20 18   Temp: 98 F (36.7 C) 97.7 F (36.5 C) 97.8 F (36.6 C) 97.8 F (36.6 C)  TempSrc:      SpO2: 100% 100% 99% 98%  Weight:      Height:        PHYSICAL EXAM General: Chronically ill-appearing elderly male, cachectic, in no acute distress. HEENT: Normocephalic and atraumatic. Neck: No JVD.  Lungs: Normal respiratory effort on room air.  Right-sided rhonchi Heart: HRRR. Normal S1 and S2 without gallops or murmurs.  Abdomen: Non-distended appearing.  Msk: Normal strength and tone for age. Extremities: Warm and well perfused. No clubbing, cyanosis.  No edema.  Neuro: Alert and oriented X 3. Psych: Answers questions  appropriately.   Labs: Basic Metabolic Panel: Recent Labs    10/07/23 0316 10/08/23 0237  NA 132* 135  K 4.3 4.3  CL 102 107  CO2 21* 20*  GLUCOSE 101* 93  BUN 53* 52*  CREATININE 1.43* 1.50*  CALCIUM  8.0* 7.8*  MG  --  2.1  PHOS  --  3.5   Liver Function Tests: Recent Labs    10/07/23 0316 10/07/23 1447 10/08/23 0237  AST 94*  --  57*  ALT 135*  --  105*  ALKPHOS 219*  --  189*  BILITOT 0.8 0.9 0.8  PROT 4.9*  --  4.8*  ALBUMIN 1.7*  --  1.6*   No results for input(s): "LIPASE", "AMYLASE" in the last 72 hours. CBC: Recent Labs    10/07/23 0316 10/08/23 0237  WBC 20.8* 20.3*  HGB 9.0* 8.8*  HCT 26.8* 26.8*  MCV 97.5 99.3  PLT 350 416*   Cardiac Enzymes: Recent Labs    10/06/23 1307  CKTOTAL 13*   BNP: No results for input(s): "BNP" in the last 72 hours. D-Dimer: No results for input(s): "DDIMER" in the last 72 hours. Hemoglobin A1C: No results for input(s): "HGBA1C" in the last 72 hours. Fasting Lipid Panel: No results for input(s): "CHOL", "HDL", "LDLCALC", "TRIG", "CHOLHDL", "LDLDIRECT" in the last 72 hours. Thyroid  Function Tests: No results for input(s): "TSH", "T4TOTAL", "T3FREE", "THYROIDAB" in the last 72 hours.  Invalid input(s): "FREET3" Anemia Panel: No results for input(s): "VITAMINB12", "FOLATE", "FERRITIN", "TIBC", "IRON", "RETICCTPCT" in the last 72 hours.   Radiology: ECHO TEE Result Date: 10/07/2023    TRANSESOPHOGEAL ECHO REPORT   Patient Name:   MIKAIL SCHWANTZ Date of Exam: 10/07/2023 Medical Rec #:  [id number]           Height:       74.0 in Accession #:    [id number]          Weight:       125.0 lb Date of Birth:  07/11/1940           BSA:          1.780 m Patient Age:    82 years            BP:           95/62 mmHg Patient Gender: M                   HR:           51 bpm. Exam Location:  [facility] Procedure: Transesophageal Echo, Cardiac Doppler and Color Doppler (Both            Spectral and Color Flow Doppler were utilized during  procedure). Indications:     Not listed on TEE check-in sheet  History:         Patient has no prior history of Echocardiogram examinations.                  Risk Factors:Hypertension.  Sonographer:     Broadus Canes Referring Phys:  [id number] Lasheka Kempner Diagnosing Phys: Joetta Mustache PROCEDURE: After discussion of the risks and benefits of a TEE, an informed consent was obtained from the patient. The transesophogeal probe was passed without difficulty through the esophogus of the patient. Local oropharyngeal anesthetic was provided with Cetacaine. Sedation performed by different physician. The patient was monitored while under deep sedation. Image quality was good. The patient's vital signs; including heart rate, blood pressure, and oxygen  saturation; remained stable throughout the  procedure. The patient developed no complications during the procedure.  IMPRESSIONS  1. Left ventricular ejection fraction, by estimation, is 35 to 40%. The left ventricle has moderately decreased function.  2. Right ventricular systolic function is mildly reduced. The right ventricular size is normal.  3. Left atrial size was severely dilated. No left atrial/left atrial appendage thrombus was detected.  4. The mitral valve is normal in structure. Mild mitral valve regurgitation.  5. Low flow low gradient aortic stenosis, moderate. The aortic valve is tricuspid. Aortic valve regurgitation is mild to moderate. Conclusion(s)/Recommendation(s): No evidence of vegetation/infective endocarditis on this transesophageael echocardiogram. FINDINGS  Left Ventricle: Left ventricular  ejection fraction, by estimation, is 35 to 40%. The left ventricle has moderately decreased function. The left ventricular internal cavity size was normal in size. Right Ventricle: The right ventricular size is normal. No increase in right ventricular wall thickness. Right ventricular systolic function is mildly reduced. Left Atrium: Left atrial size was severely  dilated. No left atrial/left atrial appendage thrombus was detected. Right Atrium: Right atrial size was normal in size. Pericardium: There is no evidence of pericardial effusion. Mitral Valve: The mitral valve is normal in structure. Mild mitral valve regurgitation. Tricuspid Valve: The tricuspid valve is normal in structure. Tricuspid valve regurgitation is mild. Aortic Valve: Low flow low gradient aortic stenosis, moderate. The aortic valve is tricuspid. Aortic valve regurgitation is mild to moderate. Aortic valve mean gradient measures 11.5 mmHg. Aortic valve peak gradient measures 20.9 mmHg. Aortic valve area,  by VTI measures 1.40 cm. Pulmonic Valve: The pulmonic valve was normal in structure. Pulmonic valve regurgitation is trivial. Aorta: The aortic root is normal in size and structure. IAS/Shunts: No atrial level shunt detected by color flow Doppler.  LEFT VENTRICLE PLAX 2D LVOT diam:     2.60 cm LV SV:         56 LV SV Index:   32 LVOT Area:     5.31 cm  AORTIC VALVE AV Area (Vmax):    1.46 cm AV Area (Vmean):   1.34 cm AV Area (VTI):     1.40 cm AV Vmax:           228.50 cm/s AV Vmean:          155.000 cm/s AV VTI:            0.402 m AV Peak Grad:      20.9 mmHg AV Mean Grad:      11.5 mmHg LVOT Vmax:         62.70 cm/s LVOT Vmean:        39.200 cm/s LVOT VTI:          0.106 m LVOT/AV VTI ratio: 0.26  SHUNTS Systemic VTI:  0.11 m Systemic Diam: 2.60 cm Joetta Mustache Electronically signed by Joetta Mustache Signature Date/Time: 10/07/2023/2:00:53 PM    Final    DG Chest Port 1 View Result Date: 10/07/2023 CLINICAL DATA:  Weakness and fever.  Pleural effusion. EXAM: PORTABLE CHEST 1 VIEW COMPARISON:  CT chest, abdomen, and pelvis dated 10/06/2023. Chest radiograph dated 09/30/2023. FINDINGS: The heart size and mediastinal contours are unchanged. Aortic atherosclerosis. Large cavitary lesion is again noted in the right lung with surrounding airspace opacities. Similar small bilateral pleural effusions  with associated bibasilar atelectasis. Unchanged large hiatal hernia. No pneumothorax. No acute osseous abnormality. IMPRESSION: 1. Large cavitary lesion is again noted in the right lung with surrounding airspace opacities, concerning for cavitary pneumonia, better evaluated on the prior CT chest, abdomen, and pelvis dated 10/06/2023. 2. Similar small bilateral pleural effusions with associated bibasilar atelectasis. Electronically Signed   By: Mannie Seek M.D.   On: 10/07/2023 12:42   CT CHEST ABDOMEN PELVIS W CONTRAST Result Date: 10/06/2023 CLINICAL DATA:  Worsening leukocytosis and transaminitis despite antibiotic treatment. Unintentional weight loss. Known cavitary pneumonia. Clinical concern for possible abscess or malignancy. EXAM: CT CHEST, ABDOMEN, AND PELVIS WITH CONTRAST TECHNIQUE: Multidetector CT imaging of the chest, abdomen and pelvis was performed following the standard protocol during bolus administration of intravenous contrast. RADIATION DOSE REDUCTION: This exam was performed according to the departmental dose-optimization program which includes automated exposure control,  adjustment of the mA and/or kV according to patient size and/or use of iterative reconstruction technique. CONTRAST:  80mL OMNIPAQUE  IOHEXOL  300 MG/ML  SOLN COMPARISON:  Chest CT dated 10/01/2023. Abdomen and pelvis CT dated 08/08/2023. FINDINGS: CT CHEST FINDINGS Cardiovascular: There is a moderate-sized pulmonary embolus in the distal left main pulmonary artery and extending into the proximal left upper lobe pulmonary artery without occlusion. A smaller embolus is demonstrated in a right lower lobe pulmonary artery branch. These are not sufficient to cause right heart strain. The previously demonstrated 4.5 cm ascending thoracic aorta aneurysm currently measures 4.3 cm. Enlarged central pulmonary arteries with a main pulmonary artery measuring 3.6 cm in diameter. Enlarged heart. Atheromatous calcifications, including  the coronary arteries and aorta. Slight increase in size of a small pericardial effusion with a maximum thickness of 8 mm. Mediastinum/Nodes: No enlarged mediastinal, hilar, or axillary lymph nodes. Thyroid  gland, trachea, and esophagus demonstrate no significant findings. Again demonstrated is a large hiatal hernia with an intrathoracic stomach. Lungs/Pleura: Increased size and confluence of the large cavitary lesion previously demonstrated in the right upper lobe with a moderately thick, irregular peripheral rind and adjacent airspace opacity. This continues to contain some fluid, with mild improvement. This area currently measures 13.2 x 8.2 cm on image number 94/4, previously 8.6 x 6.5 cm in corresponding dimensions. Continued patchy airspace opacity in the adjacent portions of the right upper lobe. These areas have increased some in size and decreased some in density. The previously demonstrated right middle lobe nodular density has cavitated with 2 adjacent areas of cavitation and patchy opacity currently demonstrated in that area. Small to moderate-sized bilateral pleural effusions, increased on the right and without significant change on the left. Mild bilateral lower lobe atelectasis. Musculoskeletal: Mild thoracic spine degenerative changes. No evidence of bony metastatic disease. CT ABDOMEN PELVIS FINDINGS Hepatobiliary: No focal liver abnormality is seen. No gallstones, gallbladder wall thickening, or biliary dilatation. Pancreas: Unremarkable. No pancreatic ductal dilatation or surrounding inflammatory changes. Spleen: Normal in size without focal abnormality. Adrenals/Urinary Tract: Moderate dilatation of the left renal collecting system and ureter with mild progression mild dilatation of the right renal collecting system and ureter with mild improvement. Again demonstrated is asymmetrical anterior bladder wall thickening, thicker on the right, with improvement. A suprapubic bladder catheter is  currently demonstrated. Stable fat density left adrenal myelolipoma and probable lipid poor left adrenal adenoma. Unremarkable right adrenal gland. Stomach/Bowel: Extensive sigmoid and descending colon diverticulosis without evidence of diverticulitis. The appendix is not visualized. Again noted is the entire stomach within a large hiatal hernia. Unremarkable small bowel. Vascular/Lymphatic: Atheromatous arterial calcifications without aneurysm. No enlarged lymph nodes. Reproductive: Mildly enlarged and heterogeneous prostate gland with coarse calcifications. Other: No abdominal wall hernia or abnormality. No abdominopelvic ascites. Musculoskeletal: Moderate lumbar spine degenerative changes and mild scoliosis. IMPRESSION: 1. Bilateral pulmonary emboli, as described above. These are not sufficient to cause right heart strain. 2. Increased size and confluence of the large cavitary lesion previously demonstrated in the right upper lobe with a moderately thick, irregular peripheral rind and adjacent airspace opacity. This continues to contain some fluid, with mild improvement. This is most likely due to a cavitary pneumonia. A cavitary neoplasm is less likely since it was not present on the abdomen and pelvis CT obtained on 08/08/2023. 3. Continued patchy airspace opacity in the adjacent portions of the right upper lobe. These areas have increased some in size and decreased some in density, compatible with ongoing pneumonia. 4. The previously  demonstrated right middle lobe nodular density has cavitated with 2 adjacent areas of cavitation and patchy opacity currently demonstrated in that area, also compatible with cavitary pneumonia. 5. Small to moderate-sized bilateral pleural effusions, increased on the right and without significant change on the left. 6. Mild bilateral lower lobe atelectasis. 7. Slight increase in size of a small pericardial effusion. 8. 4.3 cm ascending thoracic aorta aneurysm. Recommend annual  imaging followup by CTA or MRA. This recommendation follows 2010 ACCF/AHA/AATS/ACR/ASA/SCA/SCAI/SIR/STS/SVM Guidelines for the Diagnosis and Management of Patients with Thoracic Aortic Disease. Circulation. 2010; 121: Z610-R604. Aortic aneurysm NOS (ICD10-I71.9) 9. Enlarged central pulmonary arteries, suggesting pulmonary arterial hypertension. 10. Cardiomegaly. 11. Large hiatal hernia with an intrathoracic stomach. 12. Moderate dilatation of the left renal collecting system and ureter with mild progression. Mild dilatation of the right renal collecting system and ureter with mild improvement. This remains compatible with bilateral ureteral obstruction at the level of the ureterovesical junction 13. Asymmetrical anterior bladder wall thickening, thicker on the right, with improvement. A suprapubic bladder catheter is currently demonstrated. 14. Stable left adrenal myelolipoma and probable lipid poor adenoma. 15. Extensive sigmoid and descending colon diverticulosis without evidence of diverticulitis. 16. Mildly enlarged and heterogeneous prostate gland. 17.  Calcific coronary artery and aortic atherosclerosis. Aortic Atherosclerosis (ICD10-I70.0). Critical Value/emergent results were called by telephone at the time of interpretation on 10/06/2023 at 2:14 pm to provider Melodi Sprung , who verbally acknowledged these results. Electronically Signed   By: Catherin Closs M.D.   On: 10/06/2023 14:32   CT CHEST WO CONTRAST Result Date: 10/01/2023 CLINICAL DATA:  Weakness, fever, abnormal x-ray EXAM: CT CHEST WITHOUT CONTRAST TECHNIQUE: Multidetector CT imaging of the chest was performed following the standard protocol without IV contrast. RADIATION DOSE REDUCTION: This exam was performed according to the departmental dose-optimization program which includes automated exposure control, adjustment of the mA and/or kV according to patient size and/or use of iterative reconstruction technique. COMPARISON:  03/04/2022,  08/08/2023, 09/30/2023 FINDINGS: Cardiovascular: Unenhanced imaging of the heart is unremarkable, without significant pericardial effusion. 4.5 cm ascending thoracic aortic aneurysm. Atherosclerosis of the aorta and coronary vasculature. Assessment of the vascular lumen cannot be performed without intravenous contrast. Mediastinum/Nodes: No enlarged mediastinal or axillary lymph nodes. Thyroid  gland, trachea, and esophagus demonstrate no significant findings. Moderate hiatal hernia. Lungs/Pleura: There is a thick walled right upper lobe cavity measuring 9.2 x 7.2 x 7.5 cm, with gas fluid level. There is surrounding dense airspace disease within the right upper lobe as well as within the right middle lobe. Findings are most consistent with cavitary pneumonia, new since the 08/08/2023 CT. There are small free-flowing bilateral pleural effusions, right greater than left. Dependent consolidation within the lower lobes most consistent with atelectasis. No pneumothorax. Central airways are patent. Upper Abdomen: Upper abdominal ascites. Musculoskeletal: No acute or destructive bony abnormalities. Reconstructed images demonstrate no additional findings. IMPRESSION: 1. Large thick walled right upper lobe cavity with gas fluid level, with dense airspace disease involving the right upper and right middle lobes, most consistent with cavitating pneumonia. This has developed since the abdominal CT 08/08/2023. 2. Small free-flowing bilateral pleural effusions. 3. Moderate hiatal hernia. 4. 4.5 cm ascending thoracic aortic aneurysm. Ascending thoracic aortic aneurysm. Recommend semi-annual imaging followup by CTA or MRA and referral to cardiothoracic surgery if not already obtained. This recommendation follows 2010 ACCF/AHA/AATS/ACR/ASA/SCA/SCAI/SIR/STS/SVM Guidelines for the Diagnosis and Management of Patients With Thoracic Aortic Disease. Circulation. 2010; 121: V409-W119. Aortic aneurysm NOS (ICD10-I71.9) 5.  Aortic  Atherosclerosis (ICD10-I70.0). 6.  Upper abdominal ascites. Electronically Signed   By: Bobbye Burrow M.D.   On: 10/01/2023 20:12   DG Chest Port 1 View Result Date: 09/30/2023 CLINICAL DATA:  83 year old male with possible sepsis. EXAM: PORTABLE CHEST 1 VIEW COMPARISON:  Chest CT 03/04/2022 and earlier. FINDINGS: Portable AP supine views at 0600 hours. Chronic retrocardiac moderate to large gastric hiatal hernia. Stable cardiac size and mediastinal contours. Chronic pulmonary hyperinflation. Patchy and indistinct new multifocal right lung opacity in both the upper and lower lung. No superimposed pneumothorax, pulmonary edema, pleural effusion. No definite left lung involvement. No acute osseous abnormality identified. Nondilated gas-filled bowel in the upper abdomen. IMPRESSION: 1. Chronic pulmonary hyperinflation with indistinct new multifocal right lung opacity suspicious for developing multilobar bronchopneumonia/pneumonia in this setting. No pleural effusion. 2. Chronic moderate to large hiatal hernia. Electronically Signed   By: Marlise Simpers M.D.   On: 09/30/2023 06:25   CT Cervical Spine Wo Contrast Result Date: 09/30/2023 CLINICAL DATA:  83 year old male status post fall in garage ester day. Generalized weakness, decreased p.o. Hematuria. Shortness of breath. EXAM: CT CERVICAL SPINE WITHOUT CONTRAST TECHNIQUE: Multidetector CT imaging of the cervical spine was performed without intravenous contrast. Multiplanar CT image reconstructions were also generated. RADIATION DOSE REDUCTION: This exam was performed according to the departmental dose-optimization program which includes automated exposure control, adjustment of the mA and/or kV according to patient size and/or use of iterative reconstruction technique. COMPARISON:  Head CT today. FINDINGS: Alignment: Maintained cervical lordosis. Cervicothoracic junction alignment is within normal limits. Bilateral posterior element alignment is within normal limits.  Skull base and vertebrae: Bone mineralization is within normal limits for age. Visualized skull base is intact. No atlanto-occipital dissociation. C1 and C2 appear intact and aligned. No acute osseous abnormality identified. Soft tissues and spinal canal: No prevertebral fluid or swelling. No visible canal hematoma. Bulky calcified carotid atherosclerosis in the neck, severe on the right (series 3, image 58). Disc levels: Generally mild for age cervical spine degeneration and capacious CT appearance of the cervical spinal canal at most levels. Upper chest: Visible upper thoracic levels appear intact. Clear lung apices with questionable emphysema. Trace intravenous gas at the thoracic inlet, likely IV access related. IMPRESSION: 1. No acute traumatic injury identified in the cervical spine. Mild for age cervical spine degeneration. 2. Calcified carotid atherosclerosis in the neck, Severe at the Right ICA. Electronically Signed   By: Marlise Simpers M.D.   On: 09/30/2023 05:57   CT HEAD WO CONTRAST ( ) Result Date: 09/30/2023 CLINICAL DATA:  83 year old male status post fall in garage ester day. Generalized weakness, decreased p.o. Hematuria. Shortness of breath. EXAM: CT HEAD WITHOUT CONTRAST TECHNIQUE: Contiguous axial images were obtained from the base of the skull through the vertex without intravenous contrast. RADIATION DOSE REDUCTION: This exam was performed according to the departmental dose-optimization program which includes automated exposure control, adjustment of the mA and/or kV according to patient size and/or use of iterative reconstruction technique. COMPARISON:  Brain MRI 04/12/2020.  Head CT 01/20/2017. FINDINGS: Brain: Cerebral volume stable and within normal limits for age. Choroid plexus cysts, normal variant. No midline shift, ventriculomegaly, mass effect, evidence of mass lesion, intracranial hemorrhage or evidence of cortically based acute infarction. Gray-white differentiation stable and within  normal limits for age. Vascular: Advanced calcified atherosclerosis at the skull base. No suspicious intracranial vascular hyperdensity. Skull: Stable, intact. Sinuses/Orbits: Visualized paranasal sinuses and mastoids are clear. Other: No acute orbit or scalp soft tissue injury identified. Stable postoperative changes to  the globes. IMPRESSION: 1. No acute intracranial abnormality or acute traumatic injury identified. 2. Stable and negative for age noncontrast CT appearance of the brain. Electronically Signed   By: Marlise Simpers M.D.   On: 09/30/2023 05:55    ECHO ordered  TELEMETRY reviewed by me 10/08/2023: Not on telemetry  EKG reviewed by me: sinus rhythm PVCs PACs rate 92 bpm  Data reviewed by me 10/08/2023: last 24h vitals tele labs imaging I/O hospitalist progress note, ID notes  Principal Problem:   Sepsis (HCC) Active Problems:   Mixed hyperlipidemia   Essential hypertension   Acute kidney injury superimposed on chronic kidney disease (HCC)   Pneumonia   UTI (urinary tract infection)   Acute encephalopathy   CKD (chronic kidney disease) stage 4, GFR 15-29 ml/min (HCC)   NSTEMI (non-ST elevated myocardial infarction) (HCC)   Protein-calorie malnutrition, severe   MRSA bacteremia   Cavitary pneumonia    ASSESSMENT AND PLAN:  DHANI FAHRER is a 83 y.o. male  with a past medical history of hypertension, hyperlipidemia, urinary retention s/p suprapubic catheter placement who presented to the ED on 09/30/2023 for weakness. Found to be septic, blood cx positive for MRSA. Cardiology was consulted for further evaluation.   # MRSA Bacteremia # Cavitary pneumonia - ruling out TB # Pulmonary embolus # Hypertension # Hyperlipidemia Patient presented with complaints of weakness, found to be septic and had positive blood cultures with MRSA.  Initial chest x-ray with right lung opacity concerning for pneumonia.  CT chest today revealed large cavitary lesion of right upper lobe as well as  bilateral pulmonary emboli. TEE yesterday without evidence of endocarditis but revealed moderately reduced EF and moderate AS.  -TTE ordered for better evaluation of LVEF, AS as well as RV function given PE.  -Continue heparin  for PE.  -Further management of pulmonary emboli, pneumonia, bacteremia per primary team and infectious disease. -Home statin held given transaminitis.  Resume when liver function improves.   This patient's plan of care was discussed and created with Dr. Bob Burn and he is in agreement.  Signed: Hamp Levine, PA-C  10/08/2023, 8:40 AM [facility]

## 2023-10-08 NOTE — Progress Notes (Signed)
 PROGRESS NOTE    Zachary [name]  RUE:[id number] DOB: July 23, 1940 DOA: 09/30/2023 PCP: [name], MD  [id number]  LOS: 8 days   Brief hospital course:   Assessment & Plan: Zachary [name] is a 83 y.o. male with medical history significant of HTN, urinary retention s/p suprapubic catheter placement,  HLD. Presents to ED from home via EMS w/ weakness.    Of note, hospitalized last month 03/04-03/07/25 w/ substantial urinary retention and AKI, Foley placed, f/u urology and suprapubic cath was placed 08/26/23. Also notable, has been following w/ PCP last visit 09/24/23 concern for unintentional weight loss, SPEP --> pending. Has been following w/ hematology last visit 09/26/23 for anemia and lung nodule, flow cytometry --> neg, CT chest --> pending    04/29: admitted to hospitalist service w/ sepsis, pna, uti, encephalopathy. ICU consult but he did not need pressors 04/30: await cultures, CT chest to eval PNA/other RML. Alert today, baseline mentation.  05/01: CT (+)cavitary pneumonia. 1 blood culture (+)MRSA.  Patient was requesting to leave AMA, see progress note and IPAL note.  Will stay at least through tonight.  ID considering for p.o. antibiotics in case patient is wanting to go home tomorrow 05/02: pt has reconsidered for treatment, he states he "made a mistake" yesterday and is now open to continuing IV antibiotics and going ot rehab hopefully same facility as his wife will be going to post-op. Palliative to follow either way.  05/03-05/04: repeat BCx negative thus far, continue current IV abx, placement to follow after the weekend  05/05: liver enzymes continue to elevate as well as alk phos, WBC also worsening, pt reports more fatigued today, coughing more toady/yesterday, but no other symptoms to explain findings, CT chest/abd/pelv --> d/w radiologist, (+)PE nonocclusive and no apparent RV strain, worsening cavitary lung lesion w/u as below and pulm consult, consult w/  cardio re: TEE vs TTE given bacteremia and now PE, started heparin  and dc lovenox . Spoke w/ CT surgery, not a candidate, they recommend pulm to consider for bronch if able.  05/06: TEE - no vegetation, TTE pending to fully eval EF. Continuing IV abx and await cultures.   Sepsis POA Worsening leukocytosis likely d/t infection Treating underlying infections as below    Cavitary pneumonia RML Likely source for MRSA bacteremia Concern for necrotizing pneumonia  Less likely malignancy but unable to rule this out  Cavitary lesion appears worse on CT 10/06/23  Infectious disease and pulm consulted Per CT surgery, not a candidate for any intervention on their end will need follow up CT chest in 4 weeks to determine need for biopsy.  --cont vanc and meropenem  --obtain AFB x3 to rule out TB   R pleural effusion, small  Pulm performed bedside ultrasound 05/05, enlarged today --right thoracentesis today with fluid studies   MRSA bacteremia Infectious Disease following  --TEE neg endocarditis --cont Vanc   ESBL UTI Suprapubic catheter d/t retention, cath was exchanged early admission S/p meropenem  Urology to follow outpatient    Pulmonary Embolism dx 10/06/23  No evidence on CT for RV strain per radiology --cont heparin  gtt   Goals of care / Advanced care planning Palliative care consult to facilitate GOC discussion  In my judgment, patient has capacity to voice his goals for quality of life 10/06/23, pt states he wants us  to do "whatever it takes to make me well," but maintain DNR if decompensates to arrest    Elevated transaminase --no symptoms to give suspicion for GB disease or  biliary obstruction CT chest/abd/pelvis - nothing to explain transaminitis  CK neg for rhabdo --monitor   Elevated troponin likely demand ischemis in setting of sepsis, less likely NSTEMI (non-ST elevated myocardial infarction) - on admission Secondary demand ischemia d/t sepsis Poor renal clearance  also confounding issue Deferred ASA in setting of hematuria, now on heparin  Monitor   CKD (chronic kidney disease) stage 4, GFR 15-29 ml/min  Creatinine 2.4 on presentation with GFR in the 20s Appears to be near baseline Monitor   Acute encephalopathy POA - resolved  Positive generalized lethargy in setting of active sepsis with recurrent multifocal pneumonia and UTI CT head within normal  Monitor    Essential hypertension --hold BP meds due to hypotension    Mixed hyperlipidemia hold lipitor    underweight based on BMI: Body mass index is 16.05 kg/m.  Anemia --Hgb stable around 8-9's --monitor     DVT prophylaxis: On:heparin  gtt Code Status: DNR  Family Communication:  Level of care: Med-Surg Dispo:   The patient is from: home Anticipated d/c is to: SNF rehab Anticipated d/c date is: 2-3 days   Subjective and Interval History:  Pt had no new complaint.    Underwent right thoracentesis today.   Objective: Vitals:   10/08/23 0807 10/08/23 1250 10/08/23 1500 10/08/23 1924  BP: 101/61 105/63 102/65 100/60  Pulse: 78 (!) 55 63 (!) 55  Resp: 18 18 18 18   Temp: 97.8 F (36.6 C) 97.7 F (36.5 C) 98 F (36.7 C) 97.7 F (36.5 C)  TempSrc:  Oral Oral   SpO2: 98% 99% 100% 100%  Weight:      Height:        Intake/Output Summary (Last 24 hours) at 10/08/2023 2016 Last data filed at 10/08/2023 1932 Gross per 24 hour  Intake 524.85 ml  Output 700 ml  Net -175.15 ml   Filed Weights   09/30/23 0457  Weight: 56.7 kg    Examination:   Constitutional: NAD, AAOx3 HEENT: conjunctivae and lids normal, EOMI CV: No cyanosis.   RESP: normal respiratory effort, on RA Extremities: left upper arm more swollen  SKIN: warm, dry Neuro: II - XII grossly intact.   Psych: Normal mood and affect.  Appropriate judgement and reason   Data Reviewed: I have personally reviewed labs and imaging studies  Time spent: 50 minutes  Garrison Kanner, MD [facility] If 7PM-7AM,  please contact night-coverage 10/08/2023, 8:16 PM

## 2023-10-08 NOTE — Consult Note (Addendum)
 Pharmacy Antibiotic Note  Zachary [name] is a 83 y.o. male admitted on 09/30/2023 with MRSA bacteremia.  Patient has history of ESBL UTI from 08/2023. On 5/1 patient stated he wished to leave and peripheral IV was removed.  He was then talked into staying the night 5/1 and his antibiotic was change to oral linezolid .  On 5/2 patient was agreeable to placing IV and eventually PICC for long-term antibiotics to treat MRSA bacteremia.  CT chest with cavitary pneumonia. Pharmacy has been  consulted for vancomycin  dosing. Also on meropenem .  Today, 10/08/2023 Day # 8 antibiotics - currently on vancomycin  Renal: SCr 1.50 mg/dl with PMH of CKD (baseline 2 mg/dL) WBC 30.8 (increased) Afebrile. Running low SBP 80-100 5/6 and 5/7 4/29 blood cx: MRSA sensitive to vancomycin  and MRSA per BCID 4/29 urine cx: ESBL E coli and E faecalis (amp/sulbactam, imipenem susc) 5/2 repeat blood cx: NGTD x5  Vancomycin  levels: 5/4 vancomycin  500mg  IV q24h - last dose given 5/4 at 14:47.  With 3rd dose of this regimen vancomycin  peak = 18 mcg/ml on 5/14 at 17:53 Vancomycin  random level 5/5 at 10:28 = 12 mcg/ml Calculated AUC = 348, Cmax 19 mcg/ml and Cmin 11 mcg/ml.  Half-life 28.3 hr  Plan: Continue vancomycin  to 750mg  IV q24h AUC 522  (goal 400-600) Cmax = 28, Cmin 16 Continue meropenem  1g IV q12h (UTI) CrCl currently 31.5 mL/min May need to recheck level(s) prior to discharge or if Scr/renal function worsens Follow repeat blood cultures ID following  Height: 6\' 2"  (188 cm) Weight: 56.7 kg (125 lb) IBW/kg (Calculated) : 82.2  Temp (24hrs), Avg:98 F (36.7 C), Min:97.7 F (36.5 C), Max:98.8 F (37.1 C)  Recent Labs  Lab 10/04/23 0432 10/05/23 0424 10/05/23 1753 10/06/23 0434 10/06/23 1028 10/07/23 0316 10/08/23 0237  WBC 15.1*  --   --  19.8*  --  20.8* 20.3*  CREATININE 1.70* 1.60*  --  1.54*  --  1.43* 1.50*  VANCOPEAK  --   --  18*  --   --   --   --   VANCORANDOM  --   --   --   --  12  --    --     Estimated Creatinine Clearance: 31.5 mL/min (A) (by C-G formula based on SCr of 1.5 mg/dL (H)).    No Known Allergies  Antimicrobials this admission: Linezolid  5/1 >> 5/2 vancomycin  4/29 >> 4/30, 5/2 >> Meropenem  4/29 >> 5/1 restarted 5/5 >> Cefepime  x 1 4/29 Flagyl  x 1 4/29   Thank you for allowing pharmacy to be a part of this patient's care.  Ara Knee, PharmD Candidate 10/08/2023 10:07 AM

## 2023-10-08 NOTE — Plan of Care (Signed)

## 2023-10-09 DIAGNOSIS — A419 Sepsis, unspecified organism: Secondary | ICD-10-CM | POA: Diagnosis not present

## 2023-10-09 DIAGNOSIS — R6520 Severe sepsis without septic shock: Secondary | ICD-10-CM | POA: Diagnosis not present

## 2023-10-09 DIAGNOSIS — G9341 Metabolic encephalopathy: Secondary | ICD-10-CM | POA: Diagnosis not present

## 2023-10-09 LAB — CULTURE, RESPIRATORY W GRAM STAIN

## 2023-10-09 LAB — HEPARIN LEVEL (UNFRACTIONATED): Heparin Unfractionated: 0.47 [IU]/mL (ref 0.30–0.70)

## 2023-10-09 LAB — BASIC METABOLIC PANEL WITH GFR
Anion gap: 8 (ref 5–15)
BUN: 50 mg/dL — ABNORMAL HIGH (ref 8–23)
CO2: 20 mmol/L — ABNORMAL LOW (ref 22–32)
Calcium: 7.9 mg/dL — ABNORMAL LOW (ref 8.9–10.3)
Chloride: 106 mmol/L (ref 98–111)
Creatinine, Ser: 1.37 mg/dL — ABNORMAL HIGH (ref 0.61–1.24)
GFR, Estimated: 52 mL/min — ABNORMAL LOW (ref >60)
Glucose, Bld: 85 mg/dL (ref 70–99)
Potassium: 4.5 mmol/L (ref 3.5–5.1)
Sodium: 134 mmol/L — ABNORMAL LOW (ref 135–145)

## 2023-10-09 LAB — CBC
HCT: 26 % — ABNORMAL LOW (ref 39.0–52.0)
Hemoglobin: 8.6 g/dL — ABNORMAL LOW (ref 13.0–17.0)
MCH: 32.8 pg (ref 26.0–34.0)
MCHC: 33.1 g/dL (ref 30.0–36.0)
MCV: 99.2 fL (ref 80.0–100.0)
Platelets: 468 10*3/uL — ABNORMAL HIGH (ref 150–400)
RBC: 2.62 MIL/uL — ABNORMAL LOW (ref 4.22–5.81)
RDW: 14.6 % (ref 11.5–15.5)
WBC: 20.9 10*3/uL — ABNORMAL HIGH (ref 4.0–10.5)
nRBC: 0 % (ref 0.0–0.2)

## 2023-10-09 LAB — ECHOCARDIOGRAM COMPLETE
AR max vel: 1.37 cm2
AV Area VTI: 1.2 cm2
AV Area mean vel: 1.36 cm2
AV Mean grad: 12 mmHg
AV Peak grad: 22.1 mmHg
Ao pk vel: 2.35 m/s
Height: 74 in
P 1/2 time: 490 ms
S' Lateral: 4.1 cm
Weight: 2000 [oz_av]

## 2023-10-09 LAB — MAGNESIUM: Magnesium: 2.1 mg/dL (ref 1.7–2.4)

## 2023-10-09 LAB — LEGIONELLA PNEUMOPHILA SEROGP 1 UR AG: L. pneumophila Serogp 1 Ur Ag: NEGATIVE

## 2023-10-09 LAB — CYTOLOGY - NON PAP

## 2023-10-09 MED ORDER — MIDODRINE HCL 5 MG PO TABS
10.0000 mg | ORAL_TABLET | Freq: Three times a day (TID) | ORAL | Status: DC
  Administered 2023-10-09 – 2023-10-14 (×13): 10 mg via ORAL
  Filled 2023-10-09 (×14): qty 2

## 2023-10-09 NOTE — Progress Notes (Signed)
 [facility] CLINIC CARDIOLOGY PROGRESS NOTE       Patient ID: Zachary [name] MRN: [id number] DOB/AGE: [birth year] 81 y.o.  Admit date: 09/30/2023 Referring Physician Dr. Melodi Sprung Primary Physician [name], MD  Primary Cardiologist Dr. Braxton Calico Reason for Consultation MRSA bacteremia, need for TEE  HPI: Zachary [name] is a 83 y.o. male  with a past medical history of hypertension, hyperlipidemia, urinary retention s/p suprapubic catheter placement who presented to the ED on 09/30/2023 for weakness. Found to be septic, blood cx positive for MRSA. Cardiology was consulted for further evaluation.   Interval history: -Patient seem and examined this AM, resting comfortably in bed.  -Reports he is feeling ok, only complaining of cough. No complaints of SOB or CP. Remains on room air.  -Cr, Hgb, PLT stable on AM labs.  -Echo results reviewed with patient.    Review of systems complete and found to be negative unless listed above    Past Medical History:  Diagnosis Date   B12 deficiency anemia 08/03/2019   Hypercholesterolemia    Hypertension    Vertigo    last episode approx 09/2018    Past Surgical History:  Procedure Laterality Date   APPENDECTOMY     CATARACT EXTRACTION Bilateral    COLONOSCOPY WITH PROPOFOL  N/A 12/24/2018   Procedure: COLONOSCOPY WITH PROPOFOL ;  Surgeon: Marnee Sink, MD;  Location: [facility] ENDOSCOPY;  Service: Endoscopy;  Laterality: N/A;   COLONOSCOPY WITH PROPOFOL  N/A 12/13/2019   Procedure: COLONOSCOPY WITH BIOPSY ;  Surgeon: Marnee Sink, MD;  Location: [facility];  Service: Endoscopy;  Laterality: N/A;  priority 3   IR CYSTOSTOMY TUBE PLACEMENT/BLADDER ASPIRATION  08/26/2023   PLEURAL SCARIFICATION  05/2015   POLYPECTOMY N/A 12/13/2019   Procedure: POLYPECTOMY;  Surgeon: Marnee Sink, MD;  Location: [facility];  Service: Endoscopy;  Laterality: N/A;   TEE WITHOUT CARDIOVERSION N/A 10/07/2023   Procedure: ECHOCARDIOGRAM,  TRANSESOPHAGEAL;  Surgeon: Alluri, Odessa Bene, MD;  Location: [facility] ORS;  Service: Cardiovascular;  Laterality: N/A;    Medications Prior to Admission  Medication Sig Dispense Refill Last Dose/Taking   acetaminophen  (TYLENOL ) 325 MG tablet Take 2 tablets (650 mg total) by mouth every 6 (six) hours as needed for mild pain (pain score 1-3), headache or fever (or Fever >/= 101).   Past Week   atorvastatin  (LIPITOR) 10 MG tablet Take 10 mg by mouth daily.   09/29/2023   calcium  carbonate (OS-CAL - DOSED IN MG OF ELEMENTAL CALCIUM ) 1250 (500 Ca) MG tablet Take 1 tablet (1,250 mg total) by mouth 2 (two) times daily with a meal. 30 tablet 0 09/29/2023   Multiple Vitamin (MULTIVITAMIN WITH MINERALS) TABS tablet Take 1 tablet by mouth daily.   09/29/2023   triamterene -hydrochlorothiazide (DYAZIDE) 37.5-25 MG capsule Take 1 capsule by mouth daily.   09/29/2023   [Paused] aspirin  EC 81 MG tablet Take 81 mg by mouth at bedtime. Pt states he has resumed as of 09/26/23      Social History   Socioeconomic History   Marital status: Married    Spouse name: Not on file   Number of children: 0   Years of education: Not on file   Highest education level: 12th grade  Occupational History   Occupation: retired  Tobacco Use   Smoking status: Never   Smokeless tobacco: Never  Vaping Use   Vaping status: Never Used  Substance and Sexual Activity   Alcohol  use: No    Alcohol /week: 0.0 standard drinks of alcohol   Drug use: No   Sexual activity: Not on file  Other Topics Concern   Not on file  Social History Narrative   Lives with Zachary [name], wife.    Social Drivers of Corporate investment banker Strain: Low Risk  (09/25/2021)   Overall Financial Resource Strain (CARDIA)    Difficulty of Paying Living Expenses: Not hard at all  Food Insecurity: No Food Insecurity (09/30/2023)   Hunger Vital Sign    Worried About Running Out of Food in the Last Year: Never true    Ran Out of Food in the Last Year: Never true   Transportation Needs: No Transportation Needs (09/30/2023)   PRAPARE - Administrator, Civil Service (Medical): No    Lack of Transportation (Non-Medical): No  Physical Activity: Insufficiently Active (09/25/2021)   Exercise Vital Sign    Days of Exercise per Week: 2 days    Minutes of Exercise per Session: 60 min  Stress: No Stress Concern Present (11/24/2019)   [name]    Feeling of Stress : Not at all  Social Connections: Moderately Isolated (09/30/2023)   Social Connection and Isolation Panel [NHANES]    Frequency of Communication with Friends and Family: Twice a week    Frequency of Social Gatherings with Friends and Family: More than three times a week    Attends Religious Services: Never    Database administrator or Organizations: No    Attends Banker Meetings: Never    Marital Status: Married  Catering manager Violence: Not At Risk (09/30/2023)   Humiliation, Afraid, Rape, and Kick questionnaire    Fear of Current or Ex-Partner: No    Emotionally Abused: No    Physically Abused: No    Sexually Abused: No    Family History  Problem Relation Age of Onset   Hypertension Mother    Hyperlipidemia Mother    Heart attack Father    Hypertension Father    CVA Father    ALS Brother    Prostate cancer Brother      Vitals:   10/08/23 1500 10/08/23 1924 10/09/23 0415 10/09/23 0738  BP: 102/65 100/60 97/62 (!) 86/67  Pulse: 63 (!) 55 71 98  Resp: 18 18 18 18   Temp: 98 F (36.7 C) 97.7 F (36.5 C) 97.6 F (36.4 C) 98.2 F (36.8 C)  TempSrc: Oral   Oral  SpO2: 100% 100% 97% 98%  Weight:      Height:        PHYSICAL EXAM General: Chronically ill-appearing elderly male, cachectic, in no acute distress. HEENT: Normocephalic and atraumatic. Neck: No JVD.  Lungs: Normal respiratory effort on room air.  Right-sided rhonchi Heart: HRRR. Normal S1 and S2 without gallops or murmurs.   Abdomen: Non-distended appearing.  Msk: Normal strength and tone for age. Extremities: Warm and well perfused. No clubbing, cyanosis.  No edema.  Neuro: Alert and oriented X 3. Psych: Answers questions appropriately.   Labs: Basic Metabolic Panel: Recent Labs    10/08/23 0237 10/09/23 0356  NA 135 134*  K 4.3 4.5  CL 107 106  CO2 20* 20*  GLUCOSE 93 85  BUN 52* 50*  CREATININE 1.50* 1.37*  CALCIUM  7.8* 7.9*  MG 2.1 2.1  PHOS 3.5  --    Liver Function Tests: Recent Labs    10/07/23 0316 10/07/23 1447 10/08/23 0237  AST 94*  --  57*  ALT 135*  --  105*  ALKPHOS 219*  --  189*  BILITOT 0.8 0.9 0.8  PROT 4.9*  --  4.8*  ALBUMIN 1.7*  --  1.6*   No results for input(s): "LIPASE", "AMYLASE" in the last 72 hours. CBC: Recent Labs    10/08/23 0237 10/09/23 0356  WBC 20.3* 20.9*  HGB 8.8* 8.6*  HCT 26.8* 26.0*  MCV 99.3 99.2  PLT 416* 468*   Cardiac Enzymes: Recent Labs    10/06/23 1307  CKTOTAL 13*   BNP: No results for input(s): "BNP" in the last 72 hours. D-Dimer: No results for input(s): "DDIMER" in the last 72 hours. Hemoglobin A1C: No results for input(s): "HGBA1C" in the last 72 hours. Fasting Lipid Panel: No results for input(s): "CHOL", "HDL", "LDLCALC", "TRIG", "CHOLHDL", "LDLDIRECT" in the last 72 hours. Thyroid  Function Tests: No results for input(s): "TSH", "T4TOTAL", "T3FREE", "THYROIDAB" in the last 72 hours.  Invalid input(s): "FREET3" Anemia Panel: No results for input(s): "VITAMINB12", "FOLATE", "FERRITIN", "TIBC", "IRON", "RETICCTPCT" in the last 72 hours.   Radiology: ECHOCARDIOGRAM COMPLETE Result Date: 10/09/2023    ECHOCARDIOGRAM REPORT   Patient Name:   Zachary [name] Date of Exam: 10/08/2023 Medical Rec #:  [id number]           Height:       74.0 in Accession #:    [id number]          Weight:       125.0 lb Date of Birth:  1941-04-16           BSA:          1.780 m Patient Age:    82 years            BP:           105/63 mmHg  Patient Gender: M                   HR:           100 bpm. Exam Location:  [facility] Procedure: 2D Echo, Cardiac Doppler and Color Doppler (Both Spectral and Color            Flow Doppler were utilized during procedure). Indications:     Pulm embolis  History:         Patient has no prior history of Echocardiogram examinations.                  CAD; Risk Factors:Hypertension.  Sonographer:     Gaston Karvonen, FE, PE Referring Phys:  [id number] Adyline Huberty Diagnosing Phys: Lida Reeks Alluri IMPRESSIONS  1. Left ventricular ejection fraction, by estimation, is 35 to 40%. The left ventricle has moderately decreased function. The left ventricle demonstrates global hypokinesis. There is mild left ventricular hypertrophy. Left ventricular diastolic parameters are indeterminate.  2. Right ventricular systolic function is normal. The right ventricular size is normal.  3. The mitral valve is normal in structure. Mild mitral valve regurgitation.  4. Low flow low gradient, mild to moderate aortic stenosis. The aortic valve is tricuspid. Aortic valve regurgitation is mild to moderate.  5. There is mild dilatation of the aortic root, measuring 42 mm. There is mild dilatation of the ascending aorta, measuring 41 mm. FINDINGS  Left Ventricle: Left ventricular ejection fraction, by estimation, is 35 to 40%. The left ventricle has moderately decreased function. The left ventricle demonstrates global hypokinesis. The left ventricular internal cavity size was normal in size. There is mild left ventricular hypertrophy. Left ventricular diastolic parameters  are indeterminate. Right Ventricle: The right ventricular size is normal. No increase in right ventricular wall thickness. Right ventricular systolic function is normal. Left Atrium: Left atrial size was normal in size. Right Atrium: Right atrial size was normal in size. Pericardium: Trivial pericardial effusion is present. The pericardial effusion is anterior to the right ventricle.  Mitral Valve: The mitral valve is normal in structure. Mild mitral valve regurgitation. Tricuspid Valve: The tricuspid valve is normal in structure. Tricuspid valve regurgitation is mild. Aortic Valve: Low flow low gradient, mild to moderate aortic stenosis. The aortic valve is tricuspid. Aortic valve regurgitation is mild to moderate. Aortic regurgitation PHT measures 490 msec. Aortic valve mean gradient measures 12.0 mmHg. Aortic valve peak gradient measures 22.1 mmHg. Aortic valve area, by VTI measures 1.20 cm. Pulmonic Valve: The pulmonic valve was not well visualized. Pulmonic valve regurgitation is mild. Aorta: The ascending aorta was not well visualized. There is mild dilatation of the aortic root, measuring 42 mm. There is mild dilatation of the ascending aorta, measuring 41 mm. IAS/Shunts: The atrial septum is grossly normal.  LEFT VENTRICLE PLAX 2D LVIDd:         4.60 cm LVIDs:         4.10 cm LV PW:         1.20 cm LV IVS:        1.20 cm LVOT diam:     2.30 cm LV SV:         53 LV SV Index:   30 LVOT Area:     4.15 cm  RIGHT VENTRICLE RV Basal diam:  3.50 cm RV Mid diam:    3.60 cm RV S prime:     15.30 cm/s TAPSE (M-mode): 1.8 cm LEFT ATRIUM           Index        RIGHT ATRIUM           Index LA Vol (A4C): 42.1 ml 23.65 ml/m  RA Area:     18.70 cm                                    RA Volume:   52.20 ml  29.33 ml/m  AORTIC VALVE AV Area (Vmax):    1.37 cm AV Area (Vmean):   1.36 cm AV Area (VTI):     1.20 cm AV Vmax:           235.00 cm/s AV Vmean:          161.000 cm/s AV VTI:            0.444 m AV Peak Grad:      22.1 mmHg AV Mean Grad:      12.0 mmHg LVOT Vmax:         77.40 cm/s LVOT Vmean:        52.800 cm/s LVOT VTI:          0.128 m LVOT/AV VTI ratio: 0.29 AI PHT:            490 msec  AORTA Ao Root diam: 4.20 cm Ao Asc diam:  4.10 cm TRICUSPID VALVE TR Peak grad:   25.0 mmHg TR Vmax:        250.00 cm/s  SHUNTS Systemic VTI:  0.13 m Systemic Diam: 2.30 cm Joetta Mustache Electronically  signed by Joetta Mustache Signature Date/Time: 10/09/2023/9:00:43 AM    Final    DG  Chest Port 1 View Result Date: 10/08/2023 CLINICAL DATA:  Status post right thoracentesis. EXAM: PORTABLE CHEST 1 VIEW COMPARISON:  Oct 07, 2023. FINDINGS: No definite pneumothorax is noted status post thoracentesis. No significant residual pleural effusion is noted. As described on prior studies. IMPRESSION: No definite pneumothorax status post thoracentesis. Electronically Signed   By: Rosalene Colon M.D.   On: 10/08/2023 13:18   US  THORACENTESIS ASP PLEURAL SPACE W/IMG GUIDE Result Date: 10/08/2023 INDICATION: 83 year old male with MRSA pneumonia, lung empyema, bilateral pleural effusions. IR was requested for diagnostic and therapeutic right-sided thoracentesis. EXAM: ULTRASOUND GUIDED DIAGNOSTIC AND THERAPEUTIC THORACENTESIS MEDICATIONS: 4 cc of 1% lidocaine  COMPLICATIONS: None immediate. PROCEDURE: An ultrasound guided thoracentesis was thoroughly discussed with the patient and questions answered. The benefits, risks, alternatives and complications were also discussed. The patient understands and wishes to proceed with the procedure. Written consent was obtained. Ultrasound was performed to localize and mark an adequate pocket of fluid in the right chest. The area was then prepped and draped in the normal sterile fashion. 1% Lidocaine  was used for local anesthesia. Under ultrasound guidance a 6 Fr Safe-T-Centesis catheter was introduced. Thoracentesis was performed. The catheter was removed and a dressing applied. FINDINGS: A total of approximately 300 cc of clear, straw-colored fluid fluid was removed. Samples were sent to the laboratory as requested by the clinical team. IMPRESSION: Successful ultrasound guided right thoracentesis yielding 300 cc of pleural fluid. Procedure performed by Lambert Pillion, PA-C Electronically Signed   By: Fernando Hoyer M.D.   On: 10/08/2023 12:27   ECHO TEE Result Date: 10/07/2023     TRANSESOPHOGEAL ECHO REPORT   Patient Name:   Zachary [name] Date of Exam: 10/07/2023 Medical Rec #:  [id number]           Height:       74.0 in Accession #:    [id number]          Weight:       125.0 lb Date of Birth:  1940/08/23           BSA:          1.780 m Patient Age:    82 years            BP:           95/62 mmHg Patient Gender: M                   HR:           51 bpm. Exam Location:  [facility] Procedure: Transesophageal Echo, Cardiac Doppler and Color Doppler (Both            Spectral and Color Flow Doppler were utilized during procedure). Indications:     Not listed on TEE check-in sheet  History:         Patient has no prior history of Echocardiogram examinations.                  Risk Factors:Hypertension.  Sonographer:     Broadus Canes Referring Phys:  [id number] Fadel Clason Diagnosing Phys: Joetta Mustache PROCEDURE: After discussion of the risks and benefits of a TEE, an informed consent was obtained from the patient. The transesophogeal probe was passed without difficulty through the esophogus of the patient. Local oropharyngeal anesthetic was provided with Cetacaine. Sedation performed by different physician. The patient was monitored while under deep sedation. Image quality was good. The patient's vital signs; including heart rate, blood pressure, and oxygen  saturation;  remained stable throughout the  procedure. The patient developed no complications during the procedure.  IMPRESSIONS  1. Left ventricular ejection fraction, by estimation, is 35 to 40%. The left ventricle has moderately decreased function.  2. Right ventricular systolic function is mildly reduced. The right ventricular size is normal.  3. Left atrial size was severely dilated. No left atrial/left atrial appendage thrombus was detected.  4. The mitral valve is normal in structure. Mild mitral valve regurgitation.  5. Low flow low gradient aortic stenosis, moderate. The aortic valve is tricuspid. Aortic valve regurgitation is mild to  moderate. Conclusion(s)/Recommendation(s): No evidence of vegetation/infective endocarditis on this transesophageael echocardiogram. FINDINGS  Left Ventricle: Left ventricular ejection fraction, by estimation, is 35 to 40%. The left ventricle has moderately decreased function. The left ventricular internal cavity size was normal in size. Right Ventricle: The right ventricular size is normal. No increase in right ventricular wall thickness. Right ventricular systolic function is mildly reduced. Left Atrium: Left atrial size was severely dilated. No left atrial/left atrial appendage thrombus was detected. Right Atrium: Right atrial size was normal in size. Pericardium: There is no evidence of pericardial effusion. Mitral Valve: The mitral valve is normal in structure. Mild mitral valve regurgitation. Tricuspid Valve: The tricuspid valve is normal in structure. Tricuspid valve regurgitation is mild. Aortic Valve: Low flow low gradient aortic stenosis, moderate. The aortic valve is tricuspid. Aortic valve regurgitation is mild to moderate. Aortic valve mean gradient measures 11.5 mmHg. Aortic valve peak gradient measures 20.9 mmHg. Aortic valve area,  by VTI measures 1.40 cm. Pulmonic Valve: The pulmonic valve was normal in structure. Pulmonic valve regurgitation is trivial. Aorta: The aortic root is normal in size and structure. IAS/Shunts: No atrial level shunt detected by color flow Doppler.  LEFT VENTRICLE PLAX 2D LVOT diam:     2.60 cm LV SV:         56 LV SV Index:   32 LVOT Area:     5.31 cm  AORTIC VALVE AV Area (Vmax):    1.46 cm AV Area (Vmean):   1.34 cm AV Area (VTI):     1.40 cm AV Vmax:           228.50 cm/s AV Vmean:          155.000 cm/s AV VTI:            0.402 m AV Peak Grad:      20.9 mmHg AV Mean Grad:      11.5 mmHg LVOT Vmax:         62.70 cm/s LVOT Vmean:        39.200 cm/s LVOT VTI:          0.106 m LVOT/AV VTI ratio: 0.26  SHUNTS Systemic VTI:  0.11 m Systemic Diam: 2.60 cm Joetta Mustache  Electronically signed by Joetta Mustache Signature Date/Time: 10/07/2023/2:00:53 PM    Final    DG Chest Port 1 View Result Date: 10/07/2023 CLINICAL DATA:  Weakness and fever.  Pleural effusion. EXAM: PORTABLE CHEST 1 VIEW COMPARISON:  CT chest, abdomen, and pelvis dated 10/06/2023. Chest radiograph dated 09/30/2023. FINDINGS: The heart size and mediastinal contours are unchanged. Aortic atherosclerosis. Large cavitary lesion is again noted in the right lung with surrounding airspace opacities. Similar small bilateral pleural effusions with associated bibasilar atelectasis. Unchanged large hiatal hernia. No pneumothorax. No acute osseous abnormality. IMPRESSION: 1. Large cavitary lesion is again noted in the right lung with surrounding airspace opacities, concerning for cavitary pneumonia, better evaluated  on the prior CT chest, abdomen, and pelvis dated 10/06/2023. 2. Similar small bilateral pleural effusions with associated bibasilar atelectasis. Electronically Signed   By: Mannie Seek M.D.   On: 10/07/2023 12:42   CT CHEST ABDOMEN PELVIS W CONTRAST Result Date: 10/06/2023 CLINICAL DATA:  Worsening leukocytosis and transaminitis despite antibiotic treatment. Unintentional weight loss. Known cavitary pneumonia. Clinical concern for possible abscess or malignancy. EXAM: CT CHEST, ABDOMEN, AND PELVIS WITH CONTRAST TECHNIQUE: Multidetector CT imaging of the chest, abdomen and pelvis was performed following the standard protocol during bolus administration of intravenous contrast. RADIATION DOSE REDUCTION: This exam was performed according to the departmental dose-optimization program which includes automated exposure control, adjustment of the mA and/or kV according to patient size and/or use of iterative reconstruction technique. CONTRAST:  80mL OMNIPAQUE  IOHEXOL  300 MG/ML  SOLN COMPARISON:  Chest CT dated 10/01/2023. Abdomen and pelvis CT dated 08/08/2023. FINDINGS: CT CHEST FINDINGS Cardiovascular: There is  a moderate-sized pulmonary embolus in the distal left main pulmonary artery and extending into the proximal left upper lobe pulmonary artery without occlusion. A smaller embolus is demonstrated in a right lower lobe pulmonary artery branch. These are not sufficient to cause right heart strain. The previously demonstrated 4.5 cm ascending thoracic aorta aneurysm currently measures 4.3 cm. Enlarged central pulmonary arteries with a main pulmonary artery measuring 3.6 cm in diameter. Enlarged heart. Atheromatous calcifications, including the coronary arteries and aorta. Slight increase in size of a small pericardial effusion with a maximum thickness of 8 mm. Mediastinum/Nodes: No enlarged mediastinal, hilar, or axillary lymph nodes. Thyroid  gland, trachea, and esophagus demonstrate no significant findings. Again demonstrated is a large hiatal hernia with an intrathoracic stomach. Lungs/Pleura: Increased size and confluence of the large cavitary lesion previously demonstrated in the right upper lobe with a moderately thick, irregular peripheral rind and adjacent airspace opacity. This continues to contain some fluid, with mild improvement. This area currently measures 13.2 x 8.2 cm on image number 94/4, previously 8.6 x 6.5 cm in corresponding dimensions. Continued patchy airspace opacity in the adjacent portions of the right upper lobe. These areas have increased some in size and decreased some in density. The previously demonstrated right middle lobe nodular density has cavitated with 2 adjacent areas of cavitation and patchy opacity currently demonstrated in that area. Small to moderate-sized bilateral pleural effusions, increased on the right and without significant change on the left. Mild bilateral lower lobe atelectasis. Musculoskeletal: Mild thoracic spine degenerative changes. No evidence of bony metastatic disease. CT ABDOMEN PELVIS FINDINGS Hepatobiliary: No focal liver abnormality is seen. No gallstones,  gallbladder wall thickening, or biliary dilatation. Pancreas: Unremarkable. No pancreatic ductal dilatation or surrounding inflammatory changes. Spleen: Normal in size without focal abnormality. Adrenals/Urinary Tract: Moderate dilatation of the left renal collecting system and ureter with mild progression mild dilatation of the right renal collecting system and ureter with mild improvement. Again demonstrated is asymmetrical anterior bladder wall thickening, thicker on the right, with improvement. A suprapubic bladder catheter is currently demonstrated. Stable fat density left adrenal myelolipoma and probable lipid poor left adrenal adenoma. Unremarkable right adrenal gland. Stomach/Bowel: Extensive sigmoid and descending colon diverticulosis without evidence of diverticulitis. The appendix is not visualized. Again noted is the entire stomach within a large hiatal hernia. Unremarkable small bowel. Vascular/Lymphatic: Atheromatous arterial calcifications without aneurysm. No enlarged lymph nodes. Reproductive: Mildly enlarged and heterogeneous prostate gland with coarse calcifications. Other: No abdominal wall hernia or abnormality. No abdominopelvic ascites. Musculoskeletal: Moderate lumbar spine degenerative changes and mild scoliosis.  IMPRESSION: 1. Bilateral pulmonary emboli, as described above. These are not sufficient to cause right heart strain. 2. Increased size and confluence of the large cavitary lesion previously demonstrated in the right upper lobe with a moderately thick, irregular peripheral rind and adjacent airspace opacity. This continues to contain some fluid, with mild improvement. This is most likely due to a cavitary pneumonia. A cavitary neoplasm is less likely since it was not present on the abdomen and pelvis CT obtained on 08/08/2023. 3. Continued patchy airspace opacity in the adjacent portions of the right upper lobe. These areas have increased some in size and decreased some in density,  compatible with ongoing pneumonia. 4. The previously demonstrated right middle lobe nodular density has cavitated with 2 adjacent areas of cavitation and patchy opacity currently demonstrated in that area, also compatible with cavitary pneumonia. 5. Small to moderate-sized bilateral pleural effusions, increased on the right and without significant change on the left. 6. Mild bilateral lower lobe atelectasis. 7. Slight increase in size of a small pericardial effusion. 8. 4.3 cm ascending thoracic aorta aneurysm. Recommend annual imaging followup by CTA or MRA. This recommendation follows 2010 ACCF/AHA/AATS/ACR/ASA/SCA/SCAI/SIR/STS/SVM Guidelines for the Diagnosis and Management of Patients with Thoracic Aortic Disease. Circulation. 2010; 121: [id number]. Aortic aneurysm NOS (ICD10-I71.9) 9. Enlarged central pulmonary arteries, suggesting pulmonary arterial hypertension. 10. Cardiomegaly. 11. Large hiatal hernia with an intrathoracic stomach. 12. Moderate dilatation of the left renal collecting system and ureter with mild progression. Mild dilatation of the right renal collecting system and ureter with mild improvement. This remains compatible with bilateral ureteral obstruction at the level of the ureterovesical junction 13. Asymmetrical anterior bladder wall thickening, thicker on the right, with improvement. A suprapubic bladder catheter is currently demonstrated. 14. Stable left adrenal myelolipoma and probable lipid poor adenoma. 15. Extensive sigmoid and descending colon diverticulosis without evidence of diverticulitis. 16. Mildly enlarged and heterogeneous prostate gland. 17.  Calcific coronary artery and aortic atherosclerosis. Aortic Atherosclerosis (ICD10-I70.0). Critical Value/emergent results were called by telephone at the time of interpretation on 10/06/2023 at 2:14 pm to provider Melodi Sprung , who verbally acknowledged these results. Electronically Signed   By: Catherin Closs M.D.   On: 10/06/2023  14:32   CT CHEST WO CONTRAST Result Date: 10/01/2023 CLINICAL DATA:  Weakness, fever, abnormal x-ray EXAM: CT CHEST WITHOUT CONTRAST TECHNIQUE: Multidetector CT imaging of the chest was performed following the standard protocol without IV contrast. RADIATION DOSE REDUCTION: This exam was performed according to the departmental dose-optimization program which includes automated exposure control, adjustment of the mA and/or kV according to patient size and/or use of iterative reconstruction technique. COMPARISON:  03/04/2022, 08/08/2023, 09/30/2023 FINDINGS: Cardiovascular: Unenhanced imaging of the heart is unremarkable, without significant pericardial effusion. 4.5 cm ascending thoracic aortic aneurysm. Atherosclerosis of the aorta and coronary vasculature. Assessment of the vascular lumen cannot be performed without intravenous contrast. Mediastinum/Nodes: No enlarged mediastinal or axillary lymph nodes. Thyroid  gland, trachea, and esophagus demonstrate no significant findings. Moderate hiatal hernia. Lungs/Pleura: There is a thick walled right upper lobe cavity measuring 9.2 x 7.2 x 7.5 cm, with gas fluid level. There is surrounding dense airspace disease within the right upper lobe as well as within the right middle lobe. Findings are most consistent with cavitary pneumonia, new since the 08/08/2023 CT. There are small free-flowing bilateral pleural effusions, right greater than left. Dependent consolidation within the lower lobes most consistent with atelectasis. No pneumothorax. Central airways are patent. Upper Abdomen: Upper abdominal ascites. Musculoskeletal: No acute or destructive  bony abnormalities. Reconstructed images demonstrate no additional findings. IMPRESSION: 1. Large thick walled right upper lobe cavity with gas fluid level, with dense airspace disease involving the right upper and right middle lobes, most consistent with cavitating pneumonia. This has developed since the abdominal CT  08/08/2023. 2. Small free-flowing bilateral pleural effusions. 3. Moderate hiatal hernia. 4. 4.5 cm ascending thoracic aortic aneurysm. Ascending thoracic aortic aneurysm. Recommend semi-annual imaging followup by CTA or MRA and referral to cardiothoracic surgery if not already obtained. This recommendation follows 2010 ACCF/AHA/AATS/ACR/ASA/SCA/SCAI/SIR/STS/SVM Guidelines for the Diagnosis and Management of Patients With Thoracic Aortic Disease. Circulation. 2010; 121: [id number]. Aortic aneurysm NOS (ICD10-I71.9) 5.  Aortic Atherosclerosis (ICD10-I70.0). 6. Upper abdominal ascites. Electronically Signed   By: Bobbye Burrow M.D.   On: 10/01/2023 20:12   DG Chest Port 1 View Result Date: 09/30/2023 CLINICAL DATA:  83 year old male with possible sepsis. EXAM: PORTABLE CHEST 1 VIEW COMPARISON:  Chest CT 03/04/2022 and earlier. FINDINGS: Portable AP supine views at 0600 hours. Chronic retrocardiac moderate to large gastric hiatal hernia. Stable cardiac size and mediastinal contours. Chronic pulmonary hyperinflation. Patchy and indistinct new multifocal right lung opacity in both the upper and lower lung. No superimposed pneumothorax, pulmonary edema, pleural effusion. No definite left lung involvement. No acute osseous abnormality identified. Nondilated gas-filled bowel in the upper abdomen. IMPRESSION: 1. Chronic pulmonary hyperinflation with indistinct new multifocal right lung opacity suspicious for developing multilobar bronchopneumonia/pneumonia in this setting. No pleural effusion. 2. Chronic moderate to large hiatal hernia. Electronically Signed   By: Marlise Simpers M.D.   On: 09/30/2023 06:25   CT Cervical Spine Wo Contrast Result Date: 09/30/2023 CLINICAL DATA:  83 year old male status post fall in garage ester day. Generalized weakness, decreased p.o. Hematuria. Shortness of breath. EXAM: CT CERVICAL SPINE WITHOUT CONTRAST TECHNIQUE: Multidetector CT imaging of the cervical spine was performed without  intravenous contrast. Multiplanar CT image reconstructions were also generated. RADIATION DOSE REDUCTION: This exam was performed according to the departmental dose-optimization program which includes automated exposure control, adjustment of the mA and/or kV according to patient size and/or use of iterative reconstruction technique. COMPARISON:  Head CT today. FINDINGS: Alignment: Maintained cervical lordosis. Cervicothoracic junction alignment is within normal limits. Bilateral posterior element alignment is within normal limits. Skull base and vertebrae: Bone mineralization is within normal limits for age. Visualized skull base is intact. No atlanto-occipital dissociation. C1 and C2 appear intact and aligned. No acute osseous abnormality identified. Soft tissues and spinal canal: No prevertebral fluid or swelling. No visible canal hematoma. Bulky calcified carotid atherosclerosis in the neck, severe on the right (series 3, image 58). Disc levels: Generally mild for age cervical spine degeneration and capacious CT appearance of the cervical spinal canal at most levels. Upper chest: Visible upper thoracic levels appear intact. Clear lung apices with questionable emphysema. Trace intravenous gas at the thoracic inlet, likely IV access related. IMPRESSION: 1. No acute traumatic injury identified in the cervical spine. Mild for age cervical spine degeneration. 2. Calcified carotid atherosclerosis in the neck, Severe at the Right ICA. Electronically Signed   By: Marlise Simpers M.D.   On: 09/30/2023 05:57   CT HEAD WO CONTRAST ( ) Result Date: 09/30/2023 CLINICAL DATA:  83 year old male status post fall in garage ester day. Generalized weakness, decreased p.o. Hematuria. Shortness of breath. EXAM: CT HEAD WITHOUT CONTRAST TECHNIQUE: Contiguous axial images were obtained from the base of the skull through the vertex without intravenous contrast. RADIATION DOSE REDUCTION: This exam was performed according to the  departmental dose-optimization program which includes automated exposure control, adjustment of the mA and/or kV according to patient size and/or use of iterative reconstruction technique. COMPARISON:  Brain MRI 04/12/2020.  Head CT 01/20/2017. FINDINGS: Brain: Cerebral volume stable and within normal limits for age. Choroid plexus cysts, normal variant. No midline shift, ventriculomegaly, mass effect, evidence of mass lesion, intracranial hemorrhage or evidence of cortically based acute infarction. Gray-white differentiation stable and within normal limits for age. Vascular: Advanced calcified atherosclerosis at the skull base. No suspicious intracranial vascular hyperdensity. Skull: Stable, intact. Sinuses/Orbits: Visualized paranasal sinuses and mastoids are clear. Other: No acute orbit or scalp soft tissue injury identified. Stable postoperative changes to the globes. IMPRESSION: 1. No acute intracranial abnormality or acute traumatic injury identified. 2. Stable and negative for age noncontrast CT appearance of the brain. Electronically Signed   By: Marlise Simpers M.D.   On: 09/30/2023 05:55    ECHO as above  TELEMETRY reviewed by me 10/09/2023: Not on telemetry  EKG reviewed by me: sinus rhythm PVCs PACs rate 92 bpm  Data reviewed by me 10/09/2023: last 24h vitals tele labs imaging I/O hospitalist progress note, ID notes, pulmonology notes  Principal Problem:   Sepsis (HCC) Active Problems:   Mixed hyperlipidemia   Essential hypertension   Acute kidney injury superimposed on chronic kidney disease (HCC)   Pneumonia   UTI (urinary tract infection)   Acute encephalopathy   CKD (chronic kidney disease) stage 4, GFR 15-29 ml/min (HCC)   NSTEMI (non-ST elevated myocardial infarction) (HCC)   Protein-calorie malnutrition, severe   MRSA bacteremia   Cavitary pneumonia    ASSESSMENT AND PLAN:  RAHZEL VIAU is a 83 y.o. male  with a past medical history of hypertension, hyperlipidemia, urinary  retention s/p suprapubic catheter placement who presented to the ED on 09/30/2023 for weakness. Found to be septic, blood cx positive for MRSA. Cardiology was consulted for further evaluation.   # MRSA Bacteremia # Cavitary pneumonia - ruling out TB # Pulmonary embolus # Hypertension # Hyperlipidemia Patient presented with complaints of weakness, found to be septic and had positive blood cultures with MRSA.  Initial chest x-ray with right lung opacity concerning for pneumonia.  CT chest today revealed large cavitary lesion of right upper lobe as well as bilateral pulmonary emboli. TEE yesterday without evidence of endocarditis but revealed moderately reduced EF and moderate AS. TTE with 35-40 %, global hypokinesis, low-flow low gradient mild to moderate aortic stenosis with mild to moderate AR. -Moderate cardiomyopathy on echo, no additions to GDMT at this time as patient remains borderline hypotension requiring midodrine and creatinine 1.37.  Will consider additions to GDMT outpatient. -Consider further workup of cardiomyopathy outpatient. -Continue heparin  for PE.  -Further management of pulmonary emboli, pneumonia, bacteremia per primary team and infectious disease. -Home statin held given transaminitis.  Resume when liver function improves.  Cardiology will sign off. Please haiku with questions or re-engage if needed.    This patient's plan of care was discussed and created with Dr. Bob Burn and he is in agreement.  Signed: Hamp Levine, PA-C  10/09/2023, 12:04 PM [facility]

## 2023-10-09 NOTE — Progress Notes (Signed)
 PROGRESS NOTE    Zachary [name]  UEA:[id number] DOB: 09/13/1940 DOA: 09/30/2023 PCP: [name], MD  [id number]  LOS: 9 days   Brief hospital course:   Assessment & Plan: Zachary [name] is a 83 y.o. male with medical history significant of HTN, urinary retention s/p suprapubic catheter placement,  HLD. Presents to ED from home via EMS w/ weakness.    Of note, hospitalized last month 03/04-03/07/25 w/ substantial urinary retention and AKI, Foley placed, f/u urology and suprapubic cath was placed 08/26/23. Also notable, has been following w/ PCP last visit 09/24/23 concern for unintentional weight loss, SPEP --> pending. Has been following w/ hematology last visit 09/26/23 for anemia and lung nodule, flow cytometry --> neg, CT chest --> pending    04/29: admitted to hospitalist service w/ sepsis, pna, uti, encephalopathy. ICU consult but he did not need pressors 04/30: await cultures, CT chest to eval PNA/other RML. Alert today, baseline mentation.  05/01: CT (+)cavitary pneumonia. 1 blood culture (+)MRSA.  Patient was requesting to leave AMA, see progress note and IPAL note.  Will stay at least through tonight.  ID considering for p.o. antibiotics in case patient is wanting to go home tomorrow 05/02: pt has reconsidered for treatment, he states he "made a mistake" yesterday and is now open to continuing IV antibiotics and going ot rehab hopefully same facility as his wife will be going to post-op. Palliative to follow either way.  05/03-05/04: repeat BCx negative thus far, continue current IV abx, placement to follow after the weekend  05/05: liver enzymes continue to elevate as well as alk phos, WBC also worsening, pt reports more fatigued today, coughing more toady/yesterday, but no other symptoms to explain findings, CT chest/abd/pelv --> d/w radiologist, (+)PE nonocclusive and no apparent RV strain, worsening cavitary lung lesion w/u as below and pulm consult, consult w/  cardio re: TEE vs TTE given bacteremia and now PE, started heparin  and dc lovenox . Spoke w/ CT surgery, not a candidate, they recommend pulm to consider for bronch if able.  05/06: TEE - no vegetation, TTE pending to fully eval EF. Continuing IV abx and await cultures.   Sepsis POA Worsening leukocytosis likely d/t infection Treating underlying infections as below    Cavitary pneumonia RML Likely source for MRSA bacteremia Concern for necrotizing pneumonia  Less likely malignancy but unable to rule this out  Cavitary lesion appears worse on CT 10/06/23  Infectious disease and pulm consulted Per CT surgery, not a candidate for any intervention on their end will need follow up CT chest in 4 weeks to determine need for biopsy.  --started on vanc  --change vanc to linezolid , per ID --obtain AFB x3 to rule out TB   R pleural effusion, small  Pulm performed bedside ultrasound 05/05, enlarged today --right thoracentesis on 5/7 with fluid studies   MRSA bacteremia Infectious Disease following  --TEE neg endocarditis --change vanc to linezolid , per ID   ESBL UTI Suprapubic catheter d/t retention, cath was exchanged early admission --cont meropenem  Urology to follow outpatient    Pulmonary Embolism dx 10/06/23  No evidence on CT for RV strain per radiology --cont heparin  gtt   Goals of care / Advanced care planning Palliative care consult to facilitate GOC discussion  In my judgment, patient has capacity to voice his goals for quality of life 10/06/23, pt states he wants us  to do "whatever it takes to make me well," but maintain DNR if decompensates to arrest  Elevated transaminase --no symptoms to give suspicion for GB disease or biliary obstruction CT chest/abd/pelvis - nothing to explain transaminitis  CK neg for rhabdo --monitor   Elevated troponin likely demand ischemis in setting of sepsis, less likely NSTEMI (non-ST elevated myocardial infarction) - on  admission Secondary demand ischemia d/t sepsis Poor renal clearance also confounding issue Deferred ASA in setting of hematuria, now on heparin  Monitor   CKD (chronic kidney disease) stage 4, GFR 15-29 ml/min  Creatinine 2.4 on presentation with GFR in the 20s Appears to be near baseline Monitor   Acute encephalopathy POA - resolved  Positive generalized lethargy in setting of active sepsis with recurrent multifocal pneumonia and UTI CT head within normal  Monitor    Essential hypertension --hold BP meds due to hypotension  Hypotension --start midodrine 10 mg TID    Mixed hyperlipidemia hold lipitor    underweight based on BMI: Body mass index is 16.05 kg/m.  Anemia --Hgb stable around 8-9's --monitor     DVT prophylaxis: On:heparin  gtt Code Status: DNR  Family Communication:  Level of care: Med-Surg Dispo:   The patient is from: home Anticipated d/c is to: SNF rehab Anticipated d/c date is: 2-3 days   Subjective and Interval History:  Pt reported feeling very weak.  Producing sputum.   Objective: Vitals:   10/09/23 0738 10/09/23 1446 10/09/23 1447 10/09/23 1546  BP: (!) 86/67 (!) 41/21 (!) 150/117 93/75  Pulse: 98 74 74   Resp: 18 18 18    Temp: 98.2 F (36.8 C) 97.6 F (36.4 C) 97.7 F (36.5 C)   TempSrc: Oral     SpO2: 98% 100% 100%   Weight:      Height:        Intake/Output Summary (Last 24 hours) at 10/09/2023 2021 Last data filed at 10/09/2023 1100 Gross per 24 hour  Intake 400 ml  Output --  Net 400 ml   Filed Weights   09/30/23 0457  Weight: 56.7 kg    Examination:   Constitutional: NAD, AAOx3 HEENT: conjunctivae and lids normal, EOMI CV: No cyanosis.   RESP: normal respiratory effort, on RA Neuro: II - XII grossly intact.   Psych: Normal mood and affect.  Appropriate judgement and reason   Data Reviewed: I have personally reviewed labs and imaging studies  Time spent: 50 minutes  Garrison Kanner, MD [facility] If  7PM-7AM, please contact night-coverage 10/09/2023, 8:21 PM

## 2023-10-09 NOTE — Consult Note (Signed)
 PHARMACY - ANTICOAGULATION CONSULT NOTE  Pharmacy Consult for Heparin   Indication: pulmonary embolus  No Known Allergies  Patient Measurements: Height: 6\' 2"  (188 cm) Weight: 56.7 kg (125 lb) IBW/kg (Calculated) : 82.2 HEPARIN  DW (KG): 56.7  Vital Signs: Temp: 97.6 F (36.4 C) (05/08 0415) BP: 97/62 (05/08 0415) Pulse Rate: 71 (05/08 0415)  Labs: Recent Labs    10/06/23 1307 10/06/23 1532 10/07/23 0006 10/07/23 0316 10/07/23 0903 10/08/23 0237 10/08/23 0713 10/08/23 1534 10/09/23 0356  HGB  --   --   --  9.0*  --  8.8*  --   --  8.6*  HCT  --   --   --  26.8*  --  26.8*  --   --  26.0*  PLT  --   --   --  350  --  416*  --   --  468*  APTT  --  40*  --   --   --   --   --   --   --   LABPROT  --  15.3*  --   --   --   --   --   --   --   INR  --  1.2  --   --   --   --   --   --   --   HEPARINUNFRC  --   --    < >  --    < >  --  0.60 0.48 0.47  CREATININE  --   --   --  1.43*  --  1.50*  --   --  1.37*  CKTOTAL 13*  --   --   --   --   --   --   --   --    < > = values in this interval not displayed.    Estimated Creatinine Clearance: 34.5 mL/min (A) (by C-G formula based on SCr of 1.37 mg/dL (H)).   Medical History: Past Medical History:  Diagnosis Date   B12 deficiency anemia 08/03/2019   Hypercholesterolemia    Hypertension    Vertigo    last episode approx 09/2018    Medications:  Medications Prior to Admission  Medication Sig Dispense Refill Last Dose/Taking   acetaminophen  (TYLENOL ) 325 MG tablet Take 2 tablets (650 mg total) by mouth every 6 (six) hours as needed for mild pain (pain score 1-3), headache or fever (or Fever >/= 101).   Past Week   atorvastatin  (LIPITOR) 10 MG tablet Take 10 mg by mouth daily.   09/29/2023   calcium  carbonate (OS-CAL - DOSED IN MG OF ELEMENTAL CALCIUM ) 1250 (500 Ca) MG tablet Take 1 tablet (1,250 mg total) by mouth 2 (two) times daily with a meal. 30 tablet 0 09/29/2023   Multiple Vitamin (MULTIVITAMIN WITH MINERALS) TABS  tablet Take 1 tablet by mouth daily.   09/29/2023   triamterene -hydrochlorothiazide (DYAZIDE) 37.5-25 MG capsule Take 1 capsule by mouth daily.   09/29/2023   [Paused] aspirin  EC 81 MG tablet Take 81 mg by mouth at bedtime. Pt states he has resumed as of 09/26/23      Scheduled:   Chlorhexidine  Gluconate Cloth  6 each Topical Q0600   feeding supplement  237 mL Oral TID BM   multivitamin with minerals  1 tablet Oral Daily   Infusions:   heparin  1,300 Units/hr (10/08/23 1932)   linezolid  (ZYVOX ) IV     meropenem  (MERREM ) IV 1 g (10/08/23 2114)  PRN: albuterol , guaiFENesin-dextromethorphan, menthol-cetylpyridinium, ondansetron  **OR** ondansetron  (ZOFRAN ) IV, sodium chloride  HYPERTONIC Anti-infectives (From admission, onward)    Start     Dose/Rate Route Frequency Ordered Stop   10/09/23 1000  linezolid  (ZYVOX ) IVPB 600 mg        600 mg 300 mL/hr over 60 Minutes Intravenous Every 12 hours 10/08/23 2029     10/06/23 1800  meropenem  (MERREM ) 1 g in sodium chloride  0.9 % 100 mL IVPB        1 g 200 mL/hr over 30 Minutes Intravenous Every 12 hours 10/06/23 1548     10/06/23 1400  vancomycin  (VANCOREADY) IVPB 750 mg/150 mL  Status:  Discontinued        750 mg 150 mL/hr over 60 Minutes Intravenous Every 24 hours 10/06/23 1135 10/08/23 2029   10/04/23 1400  vancomycin  (VANCOREADY) IVPB 500 mg/100 mL  Status:  Discontinued        500 mg 100 mL/hr over 60 Minutes Intravenous Every 24 hours 10/03/23 1624 10/06/23 1135   10/03/23 1800  vancomycin  (VANCOREADY) IVPB 500 mg/100 mL        500 mg 100 mL/hr over 60 Minutes Intravenous  Once 10/03/23 1624 10/03/23 1859   10/02/23 2200  linezolid  (ZYVOX ) tablet 600 mg  Status:  Discontinued        600 mg Oral Every 12 hours 10/02/23 1550 10/03/23 1501   10/01/23 0900  vancomycin  (VANCOCIN ) IVPB 1000 mg/200 mL premix  Status:  Discontinued        1,000 mg 200 mL/hr over 60 Minutes Intravenous Every 48 hours 10/01/23 0736 10/02/23 1550   10/01/23 0800   vancomycin  variable dose per unstable renal function (pharmacist dosing)  Status:  Discontinued         Does not apply See admin instructions 09/30/23 0747 10/01/23 0738   09/30/23 1000  meropenem  (MERREM ) 500 mg in sodium chloride  0.9 % 100 mL IVPB  Status:  Discontinued        500 mg 200 mL/hr over 30 Minutes Intravenous Every 12 hours 09/30/23 0728 10/02/23 1550   09/30/23 0500  ceFEPIme  (MAXIPIME ) 2 g in sodium chloride  0.9 % 100 mL IVPB        2 g 200 mL/hr over 30 Minutes Intravenous  Once 09/30/23 0458 09/30/23 0608   09/30/23 0500  metroNIDAZOLE  (FLAGYL ) IVPB 500 mg        500 mg 100 mL/hr over 60 Minutes Intravenous  Once 09/30/23 0458 09/30/23 0826   09/30/23 0500  vancomycin  (VANCOCIN ) IVPB 1000 mg/200 mL premix        1,000 mg 200 mL/hr over 60 Minutes Intravenous  Once 09/30/23 0458 09/30/23 9147      Assessment: 83 y.o. male with medical history significant of hypertension, urinary retention s/p suprapubic catheter placement, hyperlipidemia. Presents to ED from home via EMS w/ weakness. CT scan: There is a moderate-sized pulmonary embolus in the distal left main pulmonary artery and extending into the proximal left upper lobe pulmonary artery without occlusion.  PTA medications: no DOAC  10/08/23 CBC: Hbg 8.8 (baseline 9-10), admission platelets 416.  5/6 0903 HL 0.29  5/6 2121 HL 0.29, SUBtherapeutic 5/7 0713 HL 0.60  therapeutic x 1 5/7 1534 HL 0.48, therapeutic x2 5/8 0356 HL 0.47, therapeutic X 3   Goal of Therapy:  Heparin  level 0.3-0.7 units/ml Monitor platelets by anticoagulation protocol: Yes   Plan: Heparin  level is therapeutic x 3 Continue heparin  infusion at 1300 units/hr, per cards note 5/7, plan is to  continue heparin  Check heparin  level in AM - monitor heparin  levels daily while on heparin  infusion CBC daily while on heparin   Thank you for involving pharmacy in this patient's care.   Shakeitha Umbaugh D Clinical Pharmacist 10/09/2023 4:31 AM

## 2023-10-09 NOTE — Plan of Care (Signed)

## 2023-10-09 NOTE — Progress Notes (Signed)
 Nutrition Follow-up  DOCUMENTATION CODES:   Underweight, Severe malnutrition in context of chronic illness  INTERVENTION:   -Continue regular diet -Continue MVI with minerals daily -Continue Ensure Enlive po TID, each supplement provides 350 kcal and 20 grams of protein  NUTRITION DIAGNOSIS:   Severe Malnutrition related to social / environmental circumstances as evidenced by percent weight loss, severe fat depletion, severe muscle depletion.  Ongoing  GOAL:   Patient will meet greater than or equal to 90% of their needs  Progressing   MONITOR:   PO intake, Supplement acceptance  REASON FOR ASSESSMENT:   Consult Assessment of nutrition requirement/status, Poor PO  ASSESSMENT:   Pt with a past medical history of urinary retention status post suprapubic catheter placement admitted for history of weakness.  5/6- TEE- no vegetarians 5/7- s/p rt sided diagnostic and therapeutic thoracentesis (300 ml removed)  Reviewed I/O's: -69 ml x 24 hours and -1.1 L since admission  UOP: 600 ml x 24 hours  Pt unavailable at time of visit.   No meal completion data available to assess at this time. Pt is consuming Ensure supplements.   No new wt since last visit. Noted pt with worsening edema (deep pitting).   Palliative care following for goals of care.   Plan for SNF at discharge.   Medications reviewed.   Labs reviewed: Na: 134, CBGS: 80 (inpatient orders for glycemic control are ).    Diet Order:   Diet Order             Diet regular Room service appropriate? Yes; Fluid consistency: Thin  Diet effective now                   EDUCATION NEEDS:   Education needs have been addressed  Skin:  Skin Assessment: Reviewed RN Assessment  Last BM:  10/07/23  Height:   Ht Readings from Last 1 Encounters:  09/30/23 6\' 2"  (1.88 m)    Weight:   Wt Readings from Last 1 Encounters:  09/30/23 56.7 kg    Ideal Body Weight:  86.4 kg  BMI:  Body mass index is  16.05 kg/m.  Estimated Nutritional Needs:   Kcal:  2050-2250  Protein:  115-130 grams  Fluid:  2.0-2.2 L    Herschel Lords, RD, LDN, CDCES Registered Dietitian III Certified Diabetes Care and Education Specialist If unable to reach this RD, please use "RD Inpatient" group chat on secure chat between hours of 8am-4 pm daily

## 2023-10-09 NOTE — Plan of Care (Signed)

## 2023-10-09 NOTE — Progress Notes (Signed)
 Occupational Therapy Treatment Patient Details Name: Zachary [name] MRN: [id number] DOB: 1940/06/11 Today's Date: 10/09/2023   History of present illness Zachary [name] is an 82yoM who comes to [facility] with acute weakness at home, s/p recent fall, no significant injury sustained. Pt still generally weak from his admission last month. PMH: HLD, HTN, CAD, menieres disease, B12 deficiency, suprapubic catheter.   OT comments  Upon entering the room, pt supine in bed and reports feeling "too weak". Pt laying in bed and coughing up gray blackish sputum into a cup on side of bed. Pt declines mobility. He washes face with set up A to obtain needed items. Bed placed into tendelenburg and pt is able to bridge and pull self up in bed without further assistance from therapist. Pt declined other activity at this time. Call bell and all needed items within reach.      If plan is discharge home, recommend the following:  A little help with walking and/or transfers;A little help with bathing/dressing/bathroom;Assistance with cooking/housework;Assist for transportation;Help with stairs or ramp for entrance   Equipment Recommendations  Other (comment) (defer)       Precautions / Restrictions Precautions Precautions: Fall       Mobility Bed Mobility Overal bed mobility: Needs Assistance Bed Mobility: Rolling Rolling: Supervision              Transfers                   General transfer comment: Pt declined     Balance Overall balance assessment: Needs assistance Sitting-balance support: Feet supported Sitting balance-Leahy Scale: Good     Standing balance support: Reliant on assistive device for balance, During functional activity, Bilateral upper extremity supported Standing balance-Leahy Scale: Fair                             ADL either performed or assessed with clinical judgement   ADL Overall ADL's : Needs assistance/impaired     Grooming: Wash/dry  hands;Wash/dry face;Bed level;Supervision/safety;Set up                                      Extremity/Trunk Assessment Upper Extremity Assessment Upper Extremity Assessment: Generalized weakness   Lower Extremity Assessment Lower Extremity Assessment: Generalized weakness        Vision Patient Visual Report: No change from baseline     Perception     Praxis     Communication Communication Communication: Impaired Factors Affecting Communication: Hearing impaired   Cognition Arousal: Alert Behavior During Therapy: WFL for tasks assessed/performed Cognition: No apparent impairments                               Following commands: Intact        Cueing   Cueing Techniques: Verbal cues             Pertinent Vitals/ Pain       Pain Assessment Pain Assessment: No/denies pain         Frequency  Min 2X/week        Progress Toward Goals  OT Goals(current goals can now be found in the care plan section)  Progress towards OT goals: Progressing toward goals         AM-PAC OT "6 Clicks" Daily Activity  Outcome Measure   Help from another person eating meals?: None Help from another person taking care of personal grooming?: None Help from another person toileting, which includes using toliet, bedpan, or urinal?: A Little Help from another person bathing (including washing, rinsing, drying)?: A Little Help from another person to put on and taking off regular upper body clothing?: None Help from another person to put on and taking off regular lower body clothing?: A Little 6 Click Score: 21    End of Session    OT Visit Diagnosis: Unsteadiness on feet (R26.81);Muscle weakness (generalized) (M62.81)   Activity Tolerance Patient tolerated treatment well   Patient Left in bed;with call bell/phone within reach;with bed alarm set   Nurse Communication Mobility status        Time: 1610-9604 OT Time Calculation (min): 16  min  Charges: OT General Charges $OT Visit: 1 Visit OT Treatments $Therapeutic Activity: 8-22 mins  George Kinder, MS, OTR/L , CBIS ascom [phone]  10/09/23, 4:21 PM

## 2023-10-10 ENCOUNTER — Inpatient Hospital Stay

## 2023-10-10 DIAGNOSIS — B9562 Methicillin resistant Staphylococcus aureus infection as the cause of diseases classified elsewhere: Secondary | ICD-10-CM | POA: Diagnosis not present

## 2023-10-10 DIAGNOSIS — J439 Emphysema, unspecified: Secondary | ICD-10-CM

## 2023-10-10 DIAGNOSIS — J850 Gangrene and necrosis of lung: Secondary | ICD-10-CM | POA: Diagnosis not present

## 2023-10-10 DIAGNOSIS — D72829 Elevated white blood cell count, unspecified: Secondary | ICD-10-CM

## 2023-10-10 DIAGNOSIS — N390 Urinary tract infection, site not specified: Secondary | ICD-10-CM | POA: Diagnosis not present

## 2023-10-10 DIAGNOSIS — G9341 Metabolic encephalopathy: Secondary | ICD-10-CM | POA: Diagnosis not present

## 2023-10-10 DIAGNOSIS — R7989 Other specified abnormal findings of blood chemistry: Secondary | ICD-10-CM

## 2023-10-10 DIAGNOSIS — R6520 Severe sepsis without septic shock: Secondary | ICD-10-CM | POA: Diagnosis not present

## 2023-10-10 DIAGNOSIS — J90 Pleural effusion, not elsewhere classified: Secondary | ICD-10-CM | POA: Diagnosis not present

## 2023-10-10 DIAGNOSIS — J189 Pneumonia, unspecified organism: Secondary | ICD-10-CM | POA: Diagnosis not present

## 2023-10-10 DIAGNOSIS — R7881 Bacteremia: Secondary | ICD-10-CM | POA: Diagnosis not present

## 2023-10-10 DIAGNOSIS — R748 Abnormal levels of other serum enzymes: Secondary | ICD-10-CM

## 2023-10-10 DIAGNOSIS — A419 Sepsis, unspecified organism: Secondary | ICD-10-CM | POA: Diagnosis not present

## 2023-10-10 LAB — HEPATIC FUNCTION PANEL
ALT: 73 U/L — ABNORMAL HIGH (ref 0–44)
AST: 42 U/L — ABNORMAL HIGH (ref 15–41)
Albumin: <1.5 g/dL — ABNORMAL LOW (ref 3.5–5.0)
Alkaline Phosphatase: 146 U/L — ABNORMAL HIGH (ref 38–126)
Bilirubin, Direct: 0.1 mg/dL (ref 0.0–0.2)
Indirect Bilirubin: 0.7 mg/dL (ref 0.3–0.9)
Total Bilirubin: 0.8 mg/dL (ref 0.0–1.2)
Total Protein: 4.5 g/dL — ABNORMAL LOW (ref 6.5–8.1)

## 2023-10-10 LAB — BASIC METABOLIC PANEL WITH GFR
Anion gap: 7 (ref 5–15)
BUN: 45 mg/dL — ABNORMAL HIGH (ref 8–23)
CO2: 18 mmol/L — ABNORMAL LOW (ref 22–32)
Calcium: 8 mg/dL — ABNORMAL LOW (ref 8.9–10.3)
Chloride: 108 mmol/L (ref 98–111)
Creatinine, Ser: 1.51 mg/dL — ABNORMAL HIGH (ref 0.61–1.24)
GFR, Estimated: 46 mL/min — ABNORMAL LOW (ref >60)
Glucose, Bld: 85 mg/dL (ref 70–99)
Potassium: 4.4 mmol/L (ref 3.5–5.1)
Sodium: 133 mmol/L — ABNORMAL LOW (ref 135–145)

## 2023-10-10 LAB — CBC
HCT: 25.2 % — ABNORMAL LOW (ref 39.0–52.0)
Hemoglobin: 7.9 g/dL — ABNORMAL LOW (ref 13.0–17.0)
MCH: 31.2 pg (ref 26.0–34.0)
MCHC: 31.3 g/dL (ref 30.0–36.0)
MCV: 99.6 fL (ref 80.0–100.0)
Platelets: 537 10*3/uL — ABNORMAL HIGH (ref 150–400)
RBC: 2.53 MIL/uL — ABNORMAL LOW (ref 4.22–5.81)
RDW: 14.9 % (ref 11.5–15.5)
WBC: 18.9 10*3/uL — ABNORMAL HIGH (ref 4.0–10.5)
nRBC: 0 % (ref 0.0–0.2)

## 2023-10-10 LAB — HEPARIN LEVEL (UNFRACTIONATED): Heparin Unfractionated: 0.43 [IU]/mL (ref 0.30–0.70)

## 2023-10-10 MED ORDER — SODIUM BICARBONATE 650 MG PO TABS
650.0000 mg | ORAL_TABLET | Freq: Two times a day (BID) | ORAL | Status: DC
  Administered 2023-10-10 – 2023-10-14 (×8): 650 mg via ORAL
  Filled 2023-10-10 (×9): qty 1

## 2023-10-10 MED ORDER — SODIUM CHLORIDE 3 % IN NEBU
4.0000 mL | INHALATION_SOLUTION | Freq: Once | RESPIRATORY_TRACT | Status: AC
  Administered 2023-10-10: 4 mL via RESPIRATORY_TRACT
  Filled 2023-10-10: qty 4

## 2023-10-10 MED ORDER — SODIUM CHLORIDE 0.9 % IV SOLN
1.0000 g | Freq: Two times a day (BID) | INTRAVENOUS | Status: DC
  Filled 2023-10-10: qty 20

## 2023-10-10 MED ORDER — APIXABAN 5 MG PO TABS: 5.0000 mg | ORAL_TABLET | Freq: Two times a day (BID) | ORAL | Status: DC

## 2023-10-10 MED ORDER — APIXABAN 5 MG PO TABS
10.0000 mg | ORAL_TABLET | Freq: Two times a day (BID) | ORAL | Status: DC
Start: 2023-10-10 — End: 2023-10-17
  Administered 2023-10-10 – 2023-10-14 (×8): 10 mg via ORAL
  Filled 2023-10-10 (×9): qty 2

## 2023-10-10 NOTE — Consult Note (Signed)
 PHARMACY - ANTICOAGULATION CONSULT NOTE  Pharmacy Consult for Heparin   Indication: pulmonary embolus  No Known Allergies  Patient Measurements: Height: 6\' 2"  (188 cm) Weight: 56.7 kg (125 lb) IBW/kg (Calculated) : 82.2 HEPARIN  DW (KG): 56.7  Vital Signs: Temp: 97.5 F (36.4 C) (05/09 0813) Temp Source: Oral (05/09 0319) BP: 99/68 (05/09 0813) Pulse Rate: 68 (05/09 0813)  Labs: Recent Labs    10/08/23 0237 10/08/23 0713 10/08/23 1534 10/09/23 0356 10/10/23 0713  HGB 8.8*  --   --  8.6* 7.9*  HCT 26.8*  --   --  26.0* 25.2*  PLT 416*  --   --  468* 537*  HEPARINUNFRC  --    < > 0.48 0.47 0.43  CREATININE 1.50*  --   --  1.37*  --    < > = values in this interval not displayed.    Estimated Creatinine Clearance: 34.5 mL/min (A) (by C-G formula based on SCr of 1.37 mg/dL (H)).   Medical History: Past Medical History:  Diagnosis Date   B12 deficiency anemia 08/03/2019   Hypercholesterolemia    Hypertension    Vertigo    last episode approx 09/2018    Medications:  Medications Prior to Admission  Medication Sig Dispense Refill Last Dose/Taking   acetaminophen  (TYLENOL ) 325 MG tablet Take 2 tablets (650 mg total) by mouth every 6 (six) hours as needed for mild pain (pain score 1-3), headache or fever (or Fever >/= 101).   Past Week   atorvastatin  (LIPITOR) 10 MG tablet Take 10 mg by mouth daily.   09/29/2023   calcium  carbonate (OS-CAL - DOSED IN MG OF ELEMENTAL CALCIUM ) 1250 (500 Ca) MG tablet Take 1 tablet (1,250 mg total) by mouth 2 (two) times daily with a meal. 30 tablet 0 09/29/2023   Multiple Vitamin (MULTIVITAMIN WITH MINERALS) TABS tablet Take 1 tablet by mouth daily.   09/29/2023   triamterene -hydrochlorothiazide (DYAZIDE) 37.5-25 MG capsule Take 1 capsule by mouth daily.   09/29/2023   [Paused] aspirin  EC 81 MG tablet Take 81 mg by mouth at bedtime. Pt states he has resumed as of 09/26/23      Scheduled:   Chlorhexidine  Gluconate Cloth  6 each Topical Q0600    feeding supplement  237 mL Oral TID BM   midodrine   10 mg Oral TID WC   multivitamin with minerals  1 tablet Oral Daily   Infusions:   heparin  1,300 Units/hr (10/10/23 0331)   linezolid  (ZYVOX ) IV 600 mg (10/09/23 2137)   meropenem  (MERREM ) IV 1 g (10/09/23 2138)   PRN: albuterol , guaiFENesin -dextromethorphan , menthol -cetylpyridinium, ondansetron  **OR** ondansetron  (ZOFRAN ) IV Anti-infectives (From admission, onward)    Start     Dose/Rate Route Frequency Ordered Stop   10/09/23 1000  linezolid  (ZYVOX ) IVPB 600 mg        600 mg 300 mL/hr over 60 Minutes Intravenous Every 12 hours 10/08/23 2029     10/06/23 1800  meropenem  (MERREM ) 1 g in sodium chloride  0.9 % 100 mL IVPB        1 g 200 mL/hr over 30 Minutes Intravenous Every 12 hours 10/06/23 1548     10/06/23 1400  vancomycin  (VANCOREADY) IVPB 750 mg/150 mL  Status:  Discontinued        750 mg 150 mL/hr over 60 Minutes Intravenous Every 24 hours 10/06/23 1135 10/08/23 2029   10/04/23 1400  vancomycin  (VANCOREADY) IVPB 500 mg/100 mL  Status:  Discontinued        500 mg 100  mL/hr over 60 Minutes Intravenous Every 24 hours 10/03/23 1624 10/06/23 1135   10/03/23 1800  vancomycin  (VANCOREADY) IVPB 500 mg/100 mL        500 mg 100 mL/hr over 60 Minutes Intravenous  Once 10/03/23 1624 10/03/23 1859   10/02/23 2200  linezolid  (ZYVOX ) tablet 600 mg  Status:  Discontinued        600 mg Oral Every 12 hours 10/02/23 1550 10/03/23 1501   10/01/23 0900  vancomycin  (VANCOCIN ) IVPB 1000 mg/200 mL premix  Status:  Discontinued        1,000 mg 200 mL/hr over 60 Minutes Intravenous Every 48 hours 10/01/23 0736 10/02/23 1550   10/01/23 0800  vancomycin  variable dose per unstable renal function (pharmacist dosing)  Status:  Discontinued         Does not apply See admin instructions 09/30/23 0747 10/01/23 0738   09/30/23 1000  meropenem  (MERREM ) 500 mg in sodium chloride  0.9 % 100 mL IVPB  Status:  Discontinued        500 mg 200 mL/hr over 30 Minutes  Intravenous Every 12 hours 09/30/23 0728 10/02/23 1550   09/30/23 0500  ceFEPIme  (MAXIPIME ) 2 g in sodium chloride  0.9 % 100 mL IVPB        2 g 200 mL/hr over 30 Minutes Intravenous  Once 09/30/23 0458 09/30/23 0608   09/30/23 0500  metroNIDAZOLE  (FLAGYL ) IVPB 500 mg        500 mg 100 mL/hr over 60 Minutes Intravenous  Once 09/30/23 0458 09/30/23 0826   09/30/23 0500  vancomycin  (VANCOCIN ) IVPB 1000 mg/200 mL premix        1,000 mg 200 mL/hr over 60 Minutes Intravenous  Once 09/30/23 0458 09/30/23 1610      Assessment: 83 y.o. male with medical history significant of hypertension, urinary retention s/p suprapubic catheter placement, hyperlipidemia. Presents to ED from home via EMS w/ weakness. CT scan: There is a moderate-sized pulmonary embolus in the distal left main pulmonary artery and extending into the proximal left upper lobe pulmonary artery without occlusion.  PTA medications: no DOAC  10/08/23 CBC: Hbg 8.8 > 7.9 (baseline 9-10), admission platelets 416.  5/6 0903 HL 0.29  5/6 2121 HL 0.29, SUBtherapeutic 5/7 0713 HL 0.60  therapeutic x 1 5/7 1534 HL 0.48, therapeutic x2 5/8 0356 HL 0.47, therapeutic X 3  5/9 0713 HL 0.43.   Goal of Therapy:  Heparin  level 0.3-0.7 units/ml Monitor platelets by anticoagulation protocol: Yes   Plan: Heparin  level is therapeutic. Will continue heparin  infusion at 1300 units/hr. Recheck heparin  level and CBC with AM labs.   Thank you for involving pharmacy in this patient's care.   Trinidad Funk, PharmD, BCPS Clinical Pharmacist 10/10/2023 8:27 AM

## 2023-10-10 NOTE — Plan of Care (Signed)
   Problem: Fluid Volume: Goal: Hemodynamic stability will improve Outcome: Progressing   Problem: Clinical Measurements: Goal: Diagnostic test results will improve Outcome: Progressing Goal: Signs and symptoms of infection will decrease Outcome: Progressing   Problem: Respiratory: Goal: Ability to maintain adequate ventilation will improve Outcome: Progressing

## 2023-10-10 NOTE — Plan of Care (Signed)
  Problem: Clinical Measurements: Goal: Ability to maintain clinical measurements within normal limits will improve Outcome: Progressing Goal: Will remain free from infection Outcome: Progressing Goal: Respiratory complications will improve Outcome: Progressing Goal: Cardiovascular complication will be avoided Outcome: Progressing   Problem: Pain Managment: Goal: General experience of comfort will improve and/or be controlled Outcome: Progressing   Problem: Safety: Goal: Ability to remain free from injury will improve Outcome: Progressing   Problem: Respiratory: Goal: Ability to maintain adequate ventilation will improve Outcome: Progressing

## 2023-10-10 NOTE — Progress Notes (Signed)
 Palliative Care Progress Note, Assessment & Plan   Patient Name: Zachary [name]       Date: 10/10/2023 DOB: [birth year]  Age: 83 y.o. MRN#: [id number] Attending Physician: Garrison Kanner, MD Primary Care Physician: [name], MD Admit Date: 09/30/2023  Subjective: Patient is out of bed and sitting in recliner, eating his lunch.  He acknowledges my presence and is able to make his wishes known.  No family or friends present during my visit.  HPI: Zachary [name] is a 83 y.o. male with medical history significant of HTN, urinary retention s/p suprapubic catheter placement,  HLD. Presents to ED from home via EMS w/ weakness.    Of note, hospitalized last month 03/04-03/07/25 w/ substantial urinary retention and AKI, Foley placed, f/u urology and suprapubic cath was placed 08/26/23. Also notable, has been following w/ PCP last visit 09/24/23 concern for unintentional weight loss, SPEP --> pending. Has been following w/ hematology last visit 09/26/23 for anemia and lung nodule, flow cytometry --> neg, CT chest --> pending    04/29: admitted to hospitalist service w/ sepsis, pna, uti, encephalopathy. ICU consult but he did not need pressors 04/30: await cultures, CT chest to eval PNA/other RML. Alert today, baseline mentation.  05/01: CT (+)cavitary pneumonia. 1 blood culture (+)MRSA.  Patient was requesting to leave AMA, see progress note and IPAL note.  Will stay at least through tonight.  ID considering for p.o. antibiotics in case patient is wanting to go home tomorrow 05/02: pt has reconsidered for treatment, he states he "made a mistake" yesterday and is now open to continuing IV antibiotics and going ot rehab hopefully same facility as his wife will be going to post-op. Palliative to follow either  way.  05/03-05/04: repeat BCx negative thus far, continue current IV abx, placement to follow after the weekend  05/05: liver enzymes continue to elevate as well as alk phos, WBC also worsening, pt reports more fatigued today, coughing more toady/yesterday, but no other symptoms to explain findings, CT chest/abd/pelv --> d/w radiologist, (+)PE nonocclusive and no apparent RV strain, worsening cavitary lung lesion w/u as below and pulm consult, consult w/ cardio re: TEE vs TTE given bacteremia and now PE, started heparin  and dc lovenox . Spoke w/ CT surgery, not a candidate, they recommend pulm to consider for bronch if able.  05/06: TEE - no vegetation, TTE pending to fully eval EF. Continuing IV abx and await cultures.   Summary of counseling/coordination of care: Extensive chart review completed prior to meeting patient including labs, vital signs, imaging, progress notes, orders, and available advanced directive documents from current and previous encounters.   After reviewing the patient's chart and assessing the patient at bedside, I spoke with patient in regards to symptom management and goals of care.   Symptoms assessed.  Patient Dors is his cough has significantly improved since yesterday.  He denies pain, discomfort, chest pain, headache, N/V/D, or other acute ailments at this time.  He endorses feeling tired quickly.  Discussed hospitalization and infection is contributing factors to patient's decreased energy.  He shares he is getting a little bit more energy every day.  Encouraged mobility.  I again attempted to elicit values  and goals important to the patient.  He shares he wants to get well so that he can safely return to be with his wife and not have to come back to the hospital.  DNR with limited interventions remains.  Patient is likely going to transfer to an SNF for rehab (hopefully to same rehab where his wife is).  In light of transferring to an outside medical facility, and  reviewed the concept of a MOST form.  Copy of MOST form given.  Details of MOST discussed.  Opportunity and space provided for patient to ask questions and voiced concerns.  He shares he will review the paperwork.  Advised him that it can be completed during this hospitalization or with any provider (MD/PA/DO/NP).  He shares he would like to fill it out at a later date 10 time.  Goals are clear.  Plan is set.  Symptom burden is low.  PMT will continue to follow and support patient throughout his hospitalization.  PMT will step back from daily visits.  Please reengage with PMT if goals change, at patient/family's request, or if patient's health deteriorates during hospitalization.  Physical Exam Vitals reviewed.  Constitutional:      General: He is not in acute distress.    Appearance: He is normal weight.  HENT:     Head: Normocephalic.     Nose: Nose normal.     Mouth/Throat:     Mouth: Mucous membranes are moist.  Eyes:     Pupils: Pupils are equal, round, and reactive to light.  Pulmonary:     Effort: Pulmonary effort is normal.  Abdominal:     Palpations: Abdomen is soft.  Musculoskeletal:     Comments: MAETC, generalized weakness  Skin:    General: Skin is warm and dry.  Neurological:     Mental Status: He is alert and oriented to person, place, and time.  Psychiatric:        Mood and Affect: Mood normal.        Behavior: Behavior normal.        Thought Content: Thought content normal.        Judgment: Judgment normal.             Total Time 35 minutes   Time spent includes: Detailed review of medical records (labs, imaging, vital signs), medically appropriate exam (mental status, respiratory, cardiac, skin), discussed with treatment team, counseling and educating patient, family and staff, documenting clinical information, medication management and coordination of care.  Judeen Nose L. Rebbeca Campi, DNP, FNP-BC Palliative Medicine Team

## 2023-10-10 NOTE — Progress Notes (Addendum)
 Subjective: HPI Mr. Kisler is an 83 year old male patient with a past medical history of hypertension, urinary retention status post suprapubic cath on 08/26/2023 who presented to [facility] initially on 0/30 with lethargy, encephalopathy and signs of sepsis.  His suprapubic catheter was exchanged in the ED.  Urine culture grew ESBL E. coli for which she received 3 days of meropenem  that was stopped.  His CT chest showed a right upper lobe and right middle lobe cavitary lung lesion with mediastinal lymphadenopathy.  He was also found to have MRSA bacteremia for which he was started on vancomycin .   He reports about 4 weeks of weight loss and lethargy.  However he denies any respiratory symptoms including cough sputum production shortness of breath chest tightness chest pain or wheezing.   He had a CT chest in 2023 that showed a 1 cm left lower lobe groundglass nodule.  He underwent a CT abdomen pelvis in March 2025 that showed a 1 cm right middle lobe nodule that was new from prior.  The upper parts of the lungs were not captured.   His most recent CT chest shows an increase in size and confluence of the large cavitary lesion.  This was not present on his CT abdomen and pelvis in March.   TEE 10/07/2024 without any signs of endocarditis.    Quant negative and AFB smear negative.    Family history -denies any family history of pulmonary diseases.   Social history -never smoker.   Subjective  Patient seen and evaluated by the pulmonary team. Remains without pulmonary symptoms.   AFB  smear negative 05/05 and 05/08. Quantgold negative.   Sputum with extensive MRSA.   WBC downtrending. Platelets uptrending.   S/p thoracentesis on 05/08 with fluid analysis consistent with an exudate parpnemonic effusion. No concern for empyema.   Physical exam GEN no acute distress, frail HEENT supple neck, reactive pupils Lungs diminished over the right hemithorax CVS normal S1, normal S2, regular rate and  rhythm Abdomen soft nontender nondistended positive bowel sound Extremities warm well-perfused no edema.   Labs and imaging were reviewed.   Assessment and Plan Mr. Manjarres is an 83 year old male patient with a past medical history of urinary retention status post suprapubic catheter on 08/26/2023 who presented to [facility] on 04/30 with lethargy.  He was found to have MRSA bacteremia and a cavitary lung lesion.   His repeat CT scan compared to 5 days ago shows worsening cavitary lung lesion.  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  Less likely to be malignancy however an underlying malignant process cannot be ruled out.  But I think right now our focus should be on treating this necrotizing pneumonia that is getting worse and now with uptrending white count.  He will need follow up CT chest in 4 weeks to determine need for biopsy.    Bedside ultrasound this am with enlarging non complex right parapnemonic effusion. Thoracentesis consistent with an exudate parapnemonic effusion no signs of empyema.      Finally is Quanteferon gold is negative, AFB smear is negative x2 and there is low suspicon for TB with an alternative explanation of his necrotizing pneumonia, however to complete per protocol will need negative x3 AFB.   I will arrange an outpatient follow up in the pulmonary clinic.   Pulmonary Team will sign off.   I spent 35 minutes caring for this patient today, including preparing to see the patient, obtaining a medical  history , reviewing a separately obtained history, performing a medically appropriate examination and/or evaluation, counseling and educating the patient/family/caregiver, documenting clinical information in the electronic health record, and independently interpreting results (not separately reported/billed) and communicating results to the patient/family/caregiver  Annitta Kindler, MD [facility] 10/10/2023 6:22 PM

## 2023-10-10 NOTE — Progress Notes (Signed)
 Date of Admission:  09/30/2023      ID: Zachary [name] is a 83 y.o. male  Principal Problem:   Sepsis (HCC) Active Problems:   Mixed hyperlipidemia   Essential hypertension   Acute kidney injury superimposed on chronic kidney disease (HCC)   Pneumonia   UTI (urinary tract infection)   Acute encephalopathy   CKD (chronic kidney disease) stage 4, GFR 15-29 ml/min (HCC)   NSTEMI (non-ST elevated myocardial infarction) (HCC)   Protein-calorie malnutrition, severe   MRSA bacteremia   Cavitary pneumonia    Subjective: Pt has no complaints  Medications:   apixaban  10 mg Oral BID   Followed by   Zachary [name] ON 10/17/2023] apixaban  5 mg Oral BID   Chlorhexidine  Gluconate Cloth  6 each Topical Q0600   feeding supplement  237 mL Oral TID BM   midodrine   10 mg Oral TID WC   multivitamin with minerals  1 tablet Oral Daily   sodium bicarbonate   650 mg Oral BID    Objective: Vital signs in last 24 hours: Patient Vitals for the past 24 hrs:  BP Temp Temp src Pulse Resp SpO2  10/10/23 0813 99/68 (!) 97.5 F (36.4 C) -- 68 -- 100 %  10/10/23 0319 98/63 98.4 F (36.9 C) Oral 67 18 97 %  10/09/23 2352 106/76 98.2 F (36.8 C) -- 67 20 100 %  10/09/23 2028 (!) 102/53 98 F (36.7 C) Oral (!) 54 20 100 %  10/09/23 1546 93/75 -- -- -- -- --  10/09/23 1447 (!) 150/117 97.7 F (36.5 C) -- 74 18 100 %  10/09/23 1446 (!) 41/21 97.6 F (36.4 C) -- 74 18 100 %      PHYSICAL EXAM:  General: Alert, cooperative, no distress, emaciated Lungs: b/l air entry- crepts rt side Heart: Regular rate and rhythm, no murmur, rub or gallop. Abdomen: Soft, suprapubic catheter present Extremities: atraumatic, no cyanosis. No edema. No clubbing Skin: No rashes or lesions. Or bruising Lymph: Cervical, supraclavicular normal. Neurologic: Grossly non-focal  Lab Results    Latest Ref Rng & Units 10/10/2023    7:13 AM 10/09/2023    3:56 AM 10/08/2023    2:37 AM  CBC  WBC 4.0 - 10.5 K/uL 18.9  20.9   20.3   Hemoglobin 13.0 - 17.0 g/dL 7.9  8.6  8.8   Hematocrit 39.0 - 52.0 % 25.2  26.0  26.8   Platelets 150 - 400 K/uL 537  468  416        Latest Ref Rng & Units 10/10/2023    7:13 AM 10/09/2023    3:56 AM 10/08/2023    2:37 AM  CMP  Glucose 70 - 99 mg/dL 85  85  93   BUN 8 - 23 mg/dL 45  50  52   Creatinine 0.61 - 1.24 mg/dL 8.65  7.84  6.96   Sodium 135 - 145 mmol/L 133  134  135   Potassium 3.5 - 5.1 mmol/L 4.4  4.5  4.3   Chloride 98 - 111 mmol/L 108  106  107   CO2 22 - 32 mmol/L 18  20  20    Calcium  8.9 - 10.3 mg/dL 8.0  7.9  7.8   Total Protein 6.5 - 8.1 g/dL   4.8   Total Bilirubin 0.0 - 1.2 mg/dL   0.8   Alkaline Phos 38 - 126 U/L   189   AST 15 - 41 U/L   57  ALT 0 - 44 U/L   105       Microbiology: 09/30/2023. 1 out of 2 sets blood culture MRSA 5/2 BC 0- NG 09/30/2023 urine culture ESBL E. coli and Enterococcus  Studies/Results: ECHOCARDIOGRAM COMPLETE Result Date: 10/09/2023    ECHOCARDIOGRAM REPORT   Patient Name:   Zachary [name] Date of Exam: 10/08/2023 Medical Rec #:  [id number]           Height:       74.0 in Accession #:    [id number]          Weight:       125.0 lb Date of Birth:  11-10-1940           BSA:          1.780 m Patient Age:    82 years            BP:           105/63 mmHg Patient Gender: M                   HR:           100 bpm. Exam Location:  [facility] Procedure: 2D Echo, Cardiac Doppler and Color Doppler (Both Spectral and Color            Flow Doppler were utilized during procedure). Indications:     Pulm embolis  History:         Patient has no prior history of Echocardiogram examinations.                  CAD; Risk Factors:Hypertension.  Sonographer:     Gaston Karvonen, FE, PE Referring Phys:  [id number] CARALYN HUDSON Diagnosing Phys: Lida Reeks Alluri IMPRESSIONS  1. Left ventricular ejection fraction, by estimation, is 35 to 40%. The left ventricle has moderately decreased function. The left ventricle demonstrates global hypokinesis. There is mild left  ventricular hypertrophy. Left ventricular diastolic parameters are indeterminate.  2. Right ventricular systolic function is normal. The right ventricular size is normal.  3. The mitral valve is normal in structure. Mild mitral valve regurgitation.  4. Low flow low gradient, mild to moderate aortic stenosis. The aortic valve is tricuspid. Aortic valve regurgitation is mild to moderate.  5. There is mild dilatation of the aortic root, measuring 42 mm. There is mild dilatation of the ascending aorta, measuring 41 mm. FINDINGS  Left Ventricle: Left ventricular ejection fraction, by estimation, is 35 to 40%. The left ventricle has moderately decreased function. The left ventricle demonstrates global hypokinesis. The left ventricular internal cavity size was normal in size. There is mild left ventricular hypertrophy. Left ventricular diastolic parameters are indeterminate. Right Ventricle: The right ventricular size is normal. No increase in right ventricular wall thickness. Right ventricular systolic function is normal. Left Atrium: Left atrial size was normal in size. Right Atrium: Right atrial size was normal in size. Pericardium: Trivial pericardial effusion is present. The pericardial effusion is anterior to the right ventricle. Mitral Valve: The mitral valve is normal in structure. Mild mitral valve regurgitation. Tricuspid Valve: The tricuspid valve is normal in structure. Tricuspid valve regurgitation is mild. Aortic Valve: Low flow low gradient, mild to moderate aortic stenosis. The aortic valve is tricuspid. Aortic valve regurgitation is mild to moderate. Aortic regurgitation PHT measures 490 msec. Aortic valve mean gradient measures 12.0 mmHg. Aortic valve peak gradient measures 22.1 mmHg. Aortic valve area, by VTI measures 1.20 cm. Pulmonic Valve: The pulmonic valve was  not well visualized. Pulmonic valve regurgitation is mild. Aorta: The ascending aorta was not well visualized. There is mild dilatation of  the aortic root, measuring 42 mm. There is mild dilatation of the ascending aorta, measuring 41 mm. IAS/Shunts: The atrial septum is grossly normal.  LEFT VENTRICLE PLAX 2D LVIDd:         4.60 cm LVIDs:         4.10 cm LV PW:         1.20 cm LV IVS:        1.20 cm LVOT diam:     2.30 cm LV SV:         53 LV SV Index:   30 LVOT Area:     4.15 cm  RIGHT VENTRICLE RV Basal diam:  3.50 cm RV Mid diam:    3.60 cm RV S prime:     15.30 cm/s TAPSE (M-mode): 1.8 cm LEFT ATRIUM           Index        RIGHT ATRIUM           Index LA Vol (A4C): 42.1 ml 23.65 ml/m  RA Area:     18.70 cm                                    RA Volume:   52.20 ml  29.33 ml/m  AORTIC VALVE AV Area (Vmax):    1.37 cm AV Area (Vmean):   1.36 cm AV Area (VTI):     1.20 cm AV Vmax:           235.00 cm/s AV Vmean:          161.000 cm/s AV VTI:            0.444 m AV Peak Grad:      22.1 mmHg AV Mean Grad:      12.0 mmHg LVOT Vmax:         77.40 cm/s LVOT Vmean:        52.800 cm/s LVOT VTI:          0.128 m LVOT/AV VTI ratio: 0.29 AI PHT:            490 msec  AORTA Ao Root diam: 4.20 cm Ao Asc diam:  4.10 cm TRICUSPID VALVE TR Peak grad:   25.0 mmHg TR Vmax:        250.00 cm/s  SHUNTS Systemic VTI:  0.13 m Systemic Diam: 2.30 cm Joetta Mustache Electronically signed by Joetta Mustache Signature Date/Time: 10/09/2023/9:00:43 AM    Final       Assessment/Plan: MRSA bacteremia Likely source MRSA cavitary pneumonia Currently on linezolid  Repeat blood culture no growth TEE no vegetations  Necrotizing Cavitary pneumonia on the right side with emphysema MRSA nares positive. Sputum positive for MRSA  Seen by pulmonary tuberculosis being ruled out  because of patient's 40 pound weight loss afb being sent 3 sputum has been sent 1 smear resulted as neg If rest comes back neg can dc airborne  leucocytosis  Abnormal LFTS.  Increasing transaminases and alkaline phosphatase  Anemia  Weight loss of 40 pounds  History of urinary retention  has suprapubic catheter ESBL E. coli and Enterococcus in urine culture.  Treated with meropenem  History of obstructive uropathy Bladder diverticulum  AKI on CKD Fluctuating cr    Discussed the management with the patient and the care team  ID will not see him this weekend  For urgent issue call the on call physician who is available only by phone

## 2023-10-10 NOTE — Progress Notes (Signed)
 PROGRESS NOTE    ADEOLU PANNONE  [name] DOB: [birth year] DOA: 09/30/2023 PCP: [name], MD  [id number]  LOS: 10 days   Brief hospital course:   Assessment & Plan: LAMBERT BIRTS is a 83 y.o. male with medical history significant of HTN, urinary retention s/p suprapubic catheter placement,  HLD. Presents to ED from home via EMS w/ weakness.    Of note, hospitalized last month 03/04-03/07/25 w/ substantial urinary retention and AKI, Foley placed, f/u urology and suprapubic cath was placed 08/26/23. Also notable, has been following w/ PCP last visit 09/24/23 concern for unintentional weight loss, SPEP --> pending. Has been following w/ hematology last visit 09/26/23 for anemia and lung nodule, flow cytometry --> neg, CT chest --> pending    04/29: admitted to hospitalist service w/ sepsis, pna, uti, encephalopathy. ICU consult but he did not need pressors 04/30: await cultures, CT chest to eval PNA/other RML. Alert today, baseline mentation.  05/01: CT (+)cavitary pneumonia. 1 blood culture (+)MRSA.  Patient was requesting to leave AMA, see progress note and IPAL note.  Will stay at least through tonight.  ID considering for p.o. antibiotics in case patient is wanting to go home tomorrow 05/02: pt has reconsidered for treatment, he states he "made a mistake" yesterday and is now open to continuing IV antibiotics and going ot rehab hopefully same facility as his wife will be going to post-op. Palliative to follow either way.  05/03-05/04: repeat BCx negative thus far, continue current IV abx, placement to follow after the weekend  05/05: liver enzymes continue to elevate as well as alk phos, WBC also worsening, pt reports more fatigued today, coughing more toady/yesterday, but no other symptoms to explain findings, CT chest/abd/pelv --> d/w radiologist, (+)PE nonocclusive and no apparent RV strain, worsening cavitary lung lesion w/u as below and pulm consult, consult w/  cardio re: TEE vs TTE given bacteremia and now PE, started heparin  and dc lovenox . Spoke w/ CT surgery, not a candidate, they recommend pulm to consider for bronch if able.  05/06: TEE - no vegetation, TTE pending to fully eval EF. Continuing IV abx and await cultures.   Sepsis POA Worsening leukocytosis likely d/t infection Treating underlying infections as below    Cavitary pneumonia RML Likely source for MRSA bacteremia Concern for necrotizing pneumonia  Less likely malignancy but unable to rule this out  Cavitary lesion appears worse on CT 10/06/23  Infectious disease and pulm consulted Per CT surgery, not a candidate for any intervention on their end will need follow up CT chest in 4 weeks to determine need for biopsy.  --first AFB smear neg --started on vanc, switched to Linezolid  --cont Linezolid  --obtain AFB x3 to rule out TB   R pleural effusion, small  Pulm performed bedside ultrasound 05/05, enlarged today --right thoracentesis on 5/7 with fluid studies   MRSA bacteremia Infectious Disease following  --TEE neg endocarditis --change vanc to linezolid , per ID --cont Linezolid    ESBL UTI Suprapubic catheter d/t retention, cath was exchanged early admission --cont meropenem  Urology to follow outpatient    Pulmonary Embolism dx 10/06/23  No evidence on CT for RV strain per radiology --transition from heparin  gtt to Eliquis   Goals of care / Advanced care planning Palliative care consult to facilitate GOC discussion  In my judgment, patient has capacity to voice his goals for quality of life 10/06/23, pt states he wants us  to do "whatever it takes to make me well," but maintain DNR  if decompensates to arrest    Elevated transaminase --no symptoms to give suspicion for GB disease or biliary obstruction CT chest/abd/pelvis - nothing to explain transaminitis  CK neg for rhabdo --monitor   Elevated troponin likely demand ischemis in setting of sepsis, less likely  NSTEMI (non-ST elevated myocardial infarction) - on admission Secondary demand ischemia d/t sepsis Poor renal clearance also confounding issue Deferred ASA in setting of hematuria, now on heparin  Monitor   AKI CKD (chronic kidney disease) stage 4, GFR 15-29 ml/min  Creatinine 2.4 on presentation with GFR in the 20s --oral hydration    Acute encephalopathy POA - resolved  Positive generalized lethargy in setting of active sepsis with recurrent multifocal pneumonia and UTI CT head within normal  Monitor    Essential hypertension --hold BP meds due to hypotension  Hypotension --cont midodrine  (new)    Mixed hyperlipidemia hold lipitor    underweight based on BMI: Body mass index is 16.05 kg/m.  Anemia --Hgb stable around 8-9's --monitor     DVT prophylaxis: [id number] Code Status: DNR  Family Communication:  Level of care: Med-Surg Dispo:   The patient is from: home Anticipated d/c is to: SNF rehab Anticipated d/c date is: 2-3 days   Subjective and Interval History:  No new complaint.   Objective: Vitals:   10/09/23 2352 10/10/23 0319 10/10/23 0813 10/10/23 1432  BP: 106/76 98/63 99/68  (!) 105/46  Pulse: 67 67 68 72  Resp: 20 18    Temp: 98.2 F (36.8 C) 98.4 F (36.9 C) (!) 97.5 F (36.4 C)   TempSrc:  Oral    SpO2: 100% 97% 100% 98%  Weight:      Height:        Intake/Output Summary (Last 24 hours) at 10/10/2023 1811 Last data filed at 10/10/2023 1651 Gross per 24 hour  Intake 1367.86 ml  Output 730 ml  Net 637.86 ml   Filed Weights   09/30/23 0457  Weight: 56.7 kg    Examination:   Constitutional: NAD, AAOx3 HEENT: conjunctivae and lids normal, EOMI CV: No cyanosis.   RESP: normal respiratory effort, on RA Neuro: II - XII grossly intact.   Psych: Normal mood and affect.  Appropriate judgement and reason   Data Reviewed: I have personally reviewed labs and imaging studies  Time spent: 50 minutes  Garrison Kanner, MD [facility] If  7PM-7AM, please contact night-coverage 10/10/2023, 6:11 PM

## 2023-10-11 DIAGNOSIS — Z66 Do not resuscitate: Secondary | ICD-10-CM

## 2023-10-11 DIAGNOSIS — Z7189 Other specified counseling: Secondary | ICD-10-CM | POA: Diagnosis not present

## 2023-10-11 DIAGNOSIS — G9341 Metabolic encephalopathy: Secondary | ICD-10-CM | POA: Diagnosis not present

## 2023-10-11 DIAGNOSIS — A419 Sepsis, unspecified organism: Secondary | ICD-10-CM | POA: Diagnosis not present

## 2023-10-11 DIAGNOSIS — Z515 Encounter for palliative care: Secondary | ICD-10-CM

## 2023-10-11 DIAGNOSIS — R6520 Severe sepsis without septic shock: Secondary | ICD-10-CM | POA: Diagnosis not present

## 2023-10-11 LAB — BASIC METABOLIC PANEL WITH GFR
Anion gap: 5 (ref 5–15)
BUN: 42 mg/dL — ABNORMAL HIGH (ref 8–23)
CO2: 23 mmol/L (ref 22–32)
Calcium: 8 mg/dL — ABNORMAL LOW (ref 8.9–10.3)
Chloride: 108 mmol/L (ref 98–111)
Creatinine, Ser: 1.46 mg/dL — ABNORMAL HIGH (ref 0.61–1.24)
GFR, Estimated: 48 mL/min — ABNORMAL LOW (ref >60)
Glucose, Bld: 85 mg/dL (ref 70–99)
Potassium: 4.3 mmol/L (ref 3.5–5.1)
Sodium: 136 mmol/L (ref 135–145)

## 2023-10-11 LAB — ACID FAST SMEAR (AFB, MYCOBACTERIA): Acid Fast Smear: NEGATIVE

## 2023-10-11 LAB — CBC
HCT: 25.4 % — ABNORMAL LOW (ref 39.0–52.0)
Hemoglobin: 8.5 g/dL — ABNORMAL LOW (ref 13.0–17.0)
MCH: 33.2 pg (ref 26.0–34.0)
MCHC: 33.5 g/dL (ref 30.0–36.0)
MCV: 99.2 fL (ref 80.0–100.0)
Platelets: 509 10*3/uL — ABNORMAL HIGH (ref 150–400)
RBC: 2.56 MIL/uL — ABNORMAL LOW (ref 4.22–5.81)
RDW: 14.8 % (ref 11.5–15.5)
WBC: 17.5 10*3/uL — ABNORMAL HIGH (ref 4.0–10.5)
nRBC: 0 % (ref 0.0–0.2)

## 2023-10-11 NOTE — Plan of Care (Signed)
 Patient is refusing all medications at this time, Patient stated he is ready to die, Notified MD. Orders received for Palliative consult.

## 2023-10-11 NOTE — Progress Notes (Signed)
 Palliative Care Progress Note, Assessment & Plan   Patient Name: Zachary [name]       Date: 10/11/2023 DOB: July 19, 1940  Age: 83 y.o. MRN#: [id number] Attending Physician: Garrison Kanner, MD Primary Care Physician: [name], MD Admit Date: 09/30/2023  Subjective: Pt resting in bed. He states he does not want anymore treatment. He advises that it is his right to make the decision that he does not want to be here anymore. His plan is to not receive treatment and stop eating until he dies. He wants to go to heaven and be with his mother and father and does not want to be a burden to his wife. Pt asked for myself and staff to respect his decision.   HPI: Zachary [name] is a 83 y.o. male with medical history significant of HTN, urinary retention s/p suprapubic catheter placement,  HLD. Presents to ED from home via EMS w/ weakness.    Of note, hospitalized last month 03/04-03/07/25 w/ substantial urinary retention and AKI, Foley placed, f/u urology and suprapubic cath was placed 08/26/23. Also notable, has been following w/ PCP last visit 09/24/23 concern for unintentional weight loss, SPEP --> pending. Has been following w/ hematology last visit 09/26/23 for anemia and lung nodule, flow cytometry --> neg, CT chest --> pending    04/29: admitted to hospitalist service w/ sepsis, pna, uti, encephalopathy. ICU consult but he did not need pressors 04/30: await cultures, CT chest to eval PNA/other RML. Alert today, baseline mentation.  05/01: CT (+)cavitary pneumonia. 1 blood culture (+)MRSA.  Patient was requesting to leave AMA, see progress note and IPAL note.  Will stay at least through tonight.  ID considering for p.o. antibiotics in case patient is wanting to go home tomorrow 05/02: pt has reconsidered  for treatment, he states he "made a mistake" yesterday and is now open to continuing IV antibiotics and going ot rehab hopefully same facility as his wife will be going to post-op. Palliative to follow either way.  05/03-05/04: repeat BCx negative thus far, continue current IV abx, placement to follow after the weekend  05/05: liver enzymes continue to elevate as well as alk phos, WBC also worsening, pt reports more fatigued today, coughing more toady/yesterday, but no other symptoms to explain findings, CT chest/abd/pelv --> d/w radiologist, (+)PE nonocclusive and no apparent RV strain, worsening cavitary lung lesion w/u as below and pulm consult, consult w/ cardio re: TEE vs TTE given bacteremia and now PE, started heparin  and dc lovenox . Spoke w/ CT surgery, not a candidate, they recommend pulm to consider for bronch if able.  05/06: TEE - no vegetation, TTE pending to fully eval EF. Continuing IV abx and await cultures.  10/06/23, pt states he wants us  to do "whatever it takes to make me well," but maintain DNR if decompensates to arrest  --today, Pt refused meds and told RN and me that he doesn't want to be a burden to his wife and wants us  to leave him alone and let him die.   Palliative care was messaged to speak with patient at bedside.    Summary of counseling/coordination of care: Extensive chart review completed prior to meeting patient including labs,  vital signs, imaging, progress notes, orders, and available advanced directive documents from current and previous encounters.   After reviewing the patient's chart and assessing the patient at bedside, I spoke with patient in regards to symptom management and goals of care.   Ill-appearing, elderly male resting in bed. He is A&O x4. Even, unlabored respirations, in no distress.   Upon entering room, patient informs that he wants to die and does not want to be a burden on his wife. When questioned about what his wife would think, he says  she would not like it.  Patient asked to respect his decision and have a good evening, politely excusing me from room.  Discussed conversation with attending and primary RN.  Called patient wife and advised her of her husband's plan. She states that he did this a few days ago and had even signed the [facility] paperwork to leave, but changed his mind and apologized to everyone. She will send her brother (POA) to visit tomorrow to see if he can talk her husband into continuing treatment.   1320: RN messaged and requested that patient is requesting PMT at bedside.   Upon entering room, patient asked again to respect his decision. He again states he wants to "lay here and die. I don't want to be a burden on my wife." He asked not to tell his wife. Pt was notified that I had already spoken to his wife about his decision. He is upset at this information, but understands. Pt again asked that we respect his decision to die, stating "if someone has cancer, they can refuse chemo. It's the same thing."   Therapeutic silence and active listening provided for patient to share his thoughts and emotions regarding current medical situation.  Emotional support provided.  Physical Exam Vitals reviewed.  Constitutional:      General: He is not in acute distress.    Appearance: He is ill-appearing.  HENT:     Head: Normocephalic and atraumatic.  Pulmonary:     Effort: Pulmonary effort is normal. No respiratory distress.  Skin:    General: Skin is warm and dry.  Neurological:     Mental Status: He is alert and oriented to person, place, and time.      Recommendations/Plan: Continue DNR/DNI status as previously documented    Family to speak with patient tomorrow PMT to follow for continued goals of care conversation    Total Time 50 minutes   Discussed plan of care with primary RN and attending MD.  Time spent includes: Detailed review of medical records (labs, imaging, vital signs), medically appropriate  exam (mental status, respiratory, cardiac, skin), discussed with treatment team, counseling and educating patient, family and staff, documenting clinical information, medication management and coordination of care.     Ina Manas, Joyice Nodal [facility] Palliative Medicine Team  10/11/2023 5:37 PM  Office [phone]  Pager [phone]

## 2023-10-11 NOTE — Progress Notes (Signed)
 PROGRESS NOTE    Zachary JUSTINIANO  [name]:[id number] DOB: 1941/05/07 DOA: 09/30/2023 PCP: [name], MD  [id number]/[id number]-AA  LOS: 11 days   Brief hospital course:   Assessment & Plan: Zachary [name] is a 83 y.o. male with medical history significant of HTN, urinary retention s/p suprapubic catheter placement,  HLD. Presents to ED from home via EMS w/ weakness.    Of note, hospitalized last month 03/04-03/07/25 w/ substantial urinary retention and AKI, Foley placed, f/u urology and suprapubic cath was placed 08/26/23. Also notable, has been following w/ PCP last visit 09/24/23 concern for unintentional weight loss, SPEP --> pending. Has been following w/ hematology last visit 09/26/23 for anemia and lung nodule, flow cytometry --> neg, CT chest --> pending    04/29: admitted to hospitalist service w/ sepsis, pna, uti, encephalopathy. ICU consult but he did not need pressors 04/30: await cultures, CT chest to eval PNA/other RML. Alert today, baseline mentation.  05/01: CT (+)cavitary pneumonia. 1 blood culture (+)MRSA.  Patient was requesting to leave AMA, see progress note and IPAL note.  Will stay at least through tonight.  ID considering for p.o. antibiotics in case patient is wanting to go home tomorrow 05/02: pt has reconsidered for treatment, he states he "made a mistake" yesterday and is now open to continuing IV antibiotics and going ot rehab hopefully same facility as his wife will be going to post-op. Palliative to follow either way.  05/03-05/04: repeat BCx negative thus far, continue current IV abx, placement to follow after the weekend  05/05: liver enzymes continue to elevate as well as alk phos, WBC also worsening, pt reports more fatigued today, coughing more toady/yesterday, but no other symptoms to explain findings, CT chest/abd/pelv --> d/w radiologist, (+)PE nonocclusive and no apparent RV strain, worsening cavitary lung lesion w/u as below and pulm consult, consult w/  cardio re: TEE vs TTE given bacteremia and now PE, started heparin  and dc lovenox . Spoke w/ CT surgery, not a candidate, they recommend pulm to consider for bronch if able.  05/06: TEE - no vegetation, TTE pending to fully eval EF. Continuing IV abx and await cultures.   Goals of care / Advanced care planning Palliative care consult to facilitate GOC discussion  10/06/23, pt states he wants us  to do "whatever it takes to make me well," but maintain DNR if decompensates to arrest  --today, Pt refused meds and told RN and me that he doesn't want to be a burden to his wife and wants us  to leave him alone and let him die. --palliative care provider to discuss options   Sepsis POA Worsening leukocytosis likely d/t infection Treating underlying infections as below    Cavitary pneumonia RML Likely source for MRSA bacteremia Concern for necrotizing pneumonia  Less likely malignancy but unable to rule this out  Cavitary lesion appears worse on CT 10/06/23  Infectious disease and pulm consulted Per CT surgery, not a candidate for any intervention on their end will need follow up CT chest in 4 weeks to determine need for biopsy.  --first AFB smear neg --started on vanc, switched to Linezolid  --cont Linezolid  --obtain AFB x3 to rule out TB   R pleural effusion, small  Pulm performed bedside ultrasound 05/05, enlarged today --right thoracentesis on 5/7 with fluid studies   MRSA bacteremia Infectious Disease following  --TEE neg endocarditis --change vanc to linezolid , per ID --cont Linezolid    ESBL UTI Suprapubic catheter d/t retention, cath was exchanged early admission --completed  a course of meropenem  Urology to follow outpatient    Pulmonary Embolism dx 10/06/23  No evidence on CT for RV strain per radiology --cont Eliquis    Elevated transaminase --no symptoms to give suspicion for GB disease or biliary obstruction CT chest/abd/pelvis - nothing to explain transaminitis  CK  neg for rhabdo    Elevated troponin likely demand ischemis in setting of sepsis, less likely NSTEMI (non-ST elevated myocardial infarction) - on admission Secondary demand ischemia d/t sepsis Poor renal clearance also confounding issue Deferred ASA in setting of hematuria, now on heparin  Monitor   AKI CKD (chronic kidney disease) stage 4, GFR 15-29 ml/min  Creatinine 2.4 on presentation with GFR in the 20s --oral hydration    Acute encephalopathy POA - resolved  Positive generalized lethargy in setting of active sepsis with recurrent multifocal pneumonia and UTI CT head within normal  Monitor    Essential hypertension --hold BP meds due to hypotension  Hypotension --cont midodrine  (new)    Mixed hyperlipidemia hold lipitor    underweight based on BMI: Body mass index is 16.05 kg/m.  Anemia --Hgb stable around 8-9's --monitor     DVT prophylaxis: [id number] Code Status: DNR  Family Communication:  Level of care: Med-Surg Dispo:   The patient is from: home Anticipated d/c is to: SNF rehab Anticipated d/c date is: 2-3 days   Subjective and Interval History:  Pt refused meds this morning and told RN and me that he doesn't want to be a burden to his wife and wants us  to leave him alone and let him die.   Objective: Vitals:   10/11/23 0000 10/11/23 0328 10/11/23 0835 10/11/23 1507  BP: 106/78 101/79 107/64 108/65  Pulse: 70 62 76 66  Resp: 18 20    Temp: 98.1 F (36.7 C) 98.2 F (36.8 C) 97.6 F (36.4 C)   TempSrc: Oral Axillary Oral   SpO2: 100% 99% 97% 99%  Weight:      Height:        Intake/Output Summary (Last 24 hours) at 10/11/2023 1612 Last data filed at 10/11/2023 0865 Gross per 24 hour  Intake 487.13 ml  Output 400 ml  Net 87.13 ml   Filed Weights   09/30/23 0457  Weight: 56.7 kg    Examination:   Constitutional: NAD, alert, oriented to person and place HEENT: conjunctivae and lids normal, EOMI CV: No cyanosis.   RESP: normal  respiratory effort, on RA Neuro: II - XII grossly intact.     Data Reviewed: I have personally reviewed labs and imaging studies  Time spent: 35 minutes  Garrison Kanner, MD [facility] If 7PM-7AM, please contact night-coverage 10/11/2023, 4:12 PM

## 2023-10-11 NOTE — Plan of Care (Signed)

## 2023-10-11 NOTE — Progress Notes (Signed)
 Patient sitting up in bed feeding himself dinner. Thanked Clinical research associate for encouragement. He said, "I had not eaten yesterday, but won't do me any harm." Patient states he decided to eat dinner tonight. Call bell in place. Safety bed alarm is in place.

## 2023-10-12 DIAGNOSIS — G9341 Metabolic encephalopathy: Secondary | ICD-10-CM | POA: Diagnosis not present

## 2023-10-12 DIAGNOSIS — Z7189 Other specified counseling: Secondary | ICD-10-CM | POA: Diagnosis not present

## 2023-10-12 DIAGNOSIS — R6520 Severe sepsis without septic shock: Secondary | ICD-10-CM | POA: Diagnosis not present

## 2023-10-12 DIAGNOSIS — A419 Sepsis, unspecified organism: Secondary | ICD-10-CM | POA: Diagnosis not present

## 2023-10-12 DIAGNOSIS — N390 Urinary tract infection, site not specified: Secondary | ICD-10-CM | POA: Diagnosis not present

## 2023-10-12 DIAGNOSIS — Z515 Encounter for palliative care: Secondary | ICD-10-CM | POA: Diagnosis not present

## 2023-10-12 LAB — BODY FLUID CULTURE W GRAM STAIN: Culture: NO GROWTH

## 2023-10-12 LAB — CBC
HCT: 28.5 % — ABNORMAL LOW (ref 39.0–52.0)
Hemoglobin: 9.2 g/dL — ABNORMAL LOW (ref 13.0–17.0)
MCH: 32.6 pg (ref 26.0–34.0)
MCHC: 32.3 g/dL (ref 30.0–36.0)
MCV: 101.1 fL — ABNORMAL HIGH (ref 80.0–100.0)
Platelets: 523 10*3/uL — ABNORMAL HIGH (ref 150–400)
RBC: 2.82 MIL/uL — ABNORMAL LOW (ref 4.22–5.81)
RDW: 14.9 % (ref 11.5–15.5)
WBC: 15.7 10*3/uL — ABNORMAL HIGH (ref 4.0–10.5)
nRBC: 0 % (ref 0.0–0.2)

## 2023-10-12 LAB — BASIC METABOLIC PANEL WITH GFR
Anion gap: 8 (ref 5–15)
BUN: 42 mg/dL — ABNORMAL HIGH (ref 8–23)
CO2: 22 mmol/L (ref 22–32)
Calcium: 8.2 mg/dL — ABNORMAL LOW (ref 8.9–10.3)
Chloride: 104 mmol/L (ref 98–111)
Creatinine, Ser: 1.55 mg/dL — ABNORMAL HIGH (ref 0.61–1.24)
GFR, Estimated: 45 mL/min — ABNORMAL LOW (ref >60)
Glucose, Bld: 92 mg/dL (ref 70–99)
Potassium: 4.5 mmol/L (ref 3.5–5.1)
Sodium: 134 mmol/L — ABNORMAL LOW (ref 135–145)

## 2023-10-12 MED ORDER — POLYETHYLENE GLYCOL 3350 17 G PO PACK
17.0000 g | PACK | Freq: Two times a day (BID) | ORAL | Status: DC
  Administered 2023-10-12 – 2023-10-14 (×5): 17 g via ORAL
  Filled 2023-10-12 (×5): qty 1

## 2023-10-12 MED ORDER — POLYETHYLENE GLYCOL 3350 17 G PO PACK
17.0000 g | PACK | Freq: Two times a day (BID) | ORAL | Status: DC
Start: 2023-10-12 — End: 2023-10-12

## 2023-10-12 NOTE — Progress Notes (Signed)
 PROGRESS NOTE    Zachary [name]  [name] DOB: [birth year] DOA: 09/30/2023 PCP: [name], MD  133A/[id number]-AA  LOS: 12 days   Brief hospital course:   Assessment & Plan: Zachary [name] is a 83 y.o. male with medical history significant of HTN, urinary retention s/p suprapubic catheter placement,  HLD. Presents to ED from home via EMS w/ weakness.    Of note, hospitalized last month 03/04-03/07/25 w/ substantial urinary retention and AKI, Foley placed, f/u urology and suprapubic cath was placed 08/26/23. Also notable, has been following w/ PCP last visit 09/24/23 concern for unintentional weight loss, SPEP --> pending. Has been following w/ hematology last visit 09/26/23 for anemia and lung nodule, flow cytometry --> neg, CT chest --> pending    04/29: admitted to hospitalist service w/ sepsis, pna, uti, encephalopathy. ICU consult but he did not need pressors 04/30: await cultures, CT chest to eval PNA/other RML. Alert today, baseline mentation.  05/01: CT (+)cavitary pneumonia. 1 blood culture (+)MRSA.  Patient was requesting to leave AMA, see progress note and IPAL note.  Will stay at least through tonight.  ID considering for p.o. antibiotics in case patient is wanting to go home tomorrow 05/02: pt has reconsidered for treatment, he states he "made a mistake" yesterday and is now open to continuing IV antibiotics and going ot rehab hopefully same facility as his wife will be going to post-op. Palliative to follow either way.  05/03-05/04: repeat BCx negative thus far, continue current IV abx, placement to follow after the weekend  05/05: liver enzymes continue to elevate as well as alk phos, WBC also worsening, pt reports more fatigued today, coughing more toady/yesterday, but no other symptoms to explain findings, CT chest/abd/pelv --> d/w radiologist, (+)PE nonocclusive and no apparent RV strain, worsening cavitary lung lesion w/u as below and pulm consult, consult w/  cardio re: TEE vs TTE given bacteremia and now PE, started heparin  and dc lovenox . Spoke w/ CT surgery, not a candidate, they recommend pulm to consider for bronch if able.  05/06: TEE - no vegetation, TTE pending to fully eval EF. Continuing IV abx and await cultures.   Goals of care / Advanced care planning Palliative care consult to facilitate GOC discussion  10/06/23, pt states he wants us  to do "whatever it takes to make me well," but maintain DNR if decompensates to arrest  --on 5/10, Pt refused meds and told RN and me that he didn't want to be a burden to his wife and wanted us  to leave him alone and let him die.  Palliative care provider saw pt.  This morning, pt again changed his mind and wanted to live and continue medical tx.  Sepsis POA Worsening leukocytosis likely d/t infection Treating underlying infections as below    Cavitary pneumonia RML Likely source for MRSA bacteremia Concern for necrotizing pneumonia  Less likely malignancy but unable to rule this out  Cavitary lesion appears worse on CT 10/06/23  Infectious disease and pulm consulted Per CT surgery, not a candidate for any intervention on their end will need follow up CT chest in 4 weeks to determine need for biopsy.  --first AFB smear neg --started on vanc, switched to Linezolid .  Refused 1 dose morning of 5/10 --cont Linezolid  --f/u AFB x3 to rule out TB   R pleural effusion, small  Pulm performed bedside ultrasound 05/05, enlarged today --right thoracentesis on 5/7 with fluid studies   MRSA bacteremia Infectious Disease following  --TEE neg  endocarditis --started on vanc, switched to Linezolid .  Refused 1 dose morning of 5/10 --cont Linezolid    ESBL UTI Suprapubic catheter d/t retention, cath was exchanged early admission --completed a course of meropenem  Urology to follow outpatient    Pulmonary Embolism dx 10/06/23  No evidence on CT for RV strain per radiology --cont Eliquis    Elevated  transaminase --no symptoms to give suspicion for GB disease or biliary obstruction CT chest/abd/pelvis - nothing to explain transaminitis  CK neg for rhabdo    Elevated troponin likely demand ischemis in setting of sepsis, less likely NSTEMI (non-ST elevated myocardial infarction) - on admission Secondary demand ischemia d/t sepsis Poor renal clearance also confounding issue Deferred ASA in setting of hematuria, now on heparin  Monitor   AKI CKD (chronic kidney disease) stage 4, GFR 15-29 ml/min  Creatinine 2.4 on presentation with GFR in the 20s --oral hydration    Acute encephalopathy POA - resolved  Positive generalized lethargy in setting of active sepsis with recurrent multifocal pneumonia and UTI CT head within normal  Monitor    Essential hypertension --hold BP meds due to hypotension  Hypotension --cont midodrine  (new)    Mixed hyperlipidemia hold lipitor    underweight based on BMI: Body mass index is 16.05 kg/m.  Anemia --Hgb stable around 8-9's --monitor     DVT prophylaxis: [id number] Code Status: DNR  Family Communication:  Level of care: Med-Surg Dispo:   The patient is from: home Anticipated d/c is to: SNF rehab Anticipated d/c date is: 2-3 days   Subjective and Interval History:  Pt changed his mind again today and said he wanted to live and continue medical tx.     Objective: Vitals:   10/11/23 1805 10/11/23 1953 10/12/23 0441 10/12/23 0851  BP: 110/67 104/65 103/74 105/79  Pulse: (!) 51 79 62 73  Resp: 16 18 18 16   Temp: 97.9 F (36.6 C) 97.6 F (36.4 C) (!) 97.3 F (36.3 C) 98.2 F (36.8 C)  TempSrc:    Oral  SpO2: 99% 100% 99% 100%  Weight:      Height:        Intake/Output Summary (Last 24 hours) at 10/12/2023 1834 Last data filed at 10/12/2023 1655 Gross per 24 hour  Intake 551.83 ml  Output 1300 ml  Net -748.17 ml   Filed Weights   09/30/23 0457  Weight: 56.7 kg    Examination:   Constitutional: NAD, alert,  oriented HEENT: conjunctivae and lids normal, EOMI CV: No cyanosis.   RESP: normal respiratory effort, on RA Neuro: II - XII grossly intact.   Psych: Labile mood and affect.     Data Reviewed: I have personally reviewed labs and imaging studies  Time spent: 35 minutes  Zachary Kanner, MD [facility] If 7PM-7AM, please contact night-coverage 10/12/2023, 6:34 PM

## 2023-10-12 NOTE — Plan of Care (Signed)
  Problem: Fluid Volume: Goal: Hemodynamic stability will improve Outcome: Progressing   Problem: Clinical Measurements: Goal: Diagnostic test results will improve Outcome: Progressing Goal: Signs and symptoms of infection will decrease Outcome: Progressing   Problem: Respiratory: Goal: Ability to maintain adequate ventilation will improve Outcome: Progressing   Problem: Education: Goal: Knowledge of General Education information will improve Description: Including pain rating scale, medication(s)/side effects and non-pharmacologic comfort measures Outcome: Progressing   Problem: Health Behavior/Discharge Planning: Goal: Ability to manage health-related needs will improve Outcome: Progressing   Problem: Clinical Measurements: Goal: Ability to maintain clinical measurements within normal limits will improve Outcome: Progressing Goal: Will remain free from infection Outcome: Progressing Goal: Diagnostic test results will improve Outcome: Progressing Goal: Respiratory complications will improve Outcome: Progressing Goal: Cardiovascular complication will be avoided Outcome: Progressing   Problem: Activity: Goal: Risk for activity intolerance will decrease Outcome: Progressing   Problem: Nutrition: Goal: Adequate nutrition will be maintained Outcome: Progressing   Problem: Coping: Goal: Level of anxiety will decrease Outcome: Progressing   Problem: Elimination: Goal: Will not experience complications related to urinary retention Outcome: Progressing   Problem: Pain Managment: Goal: General experience of comfort will improve and/or be controlled Outcome: Progressing   Problem: Safety: Goal: Ability to remain free from injury will improve Outcome: Progressing   Problem: Skin Integrity: Goal: Risk for impaired skin integrity will decrease Outcome: Progressing   Problem: Elimination: Goal: Will not experience complications related to bowel motility Outcome: Not  Progressing

## 2023-10-12 NOTE — Progress Notes (Signed)
 Palliative Care Progress Note, Assessment & Plan   Patient Name: Zachary [name]       Date: 10/12/2023 DOB: [birth year]  Age: 83 y.o. MRN#: [id number] Attending Physician: Garrison Kanner, MD Primary Care Physician: [name], MD Admit Date: 09/30/2023  Subjective: Pt resting in bed. He denies pain, SOB. Slept well last night. Ate almost all of his breakfast. Wants to get up and walk. Requesting to take a nap.   HPI: Zachary [name] is a 83 y.o. male with medical history significant of HTN, urinary retention s/p suprapubic catheter placement,  HLD. Presents to ED from home via EMS w/ weakness.    Of note, hospitalized last month 03/04-03/07/25 w/ substantial urinary retention and AKI, Foley placed, f/u urology and suprapubic cath was placed 08/26/23. Also notable, has been following w/ PCP last visit 09/24/23 concern for unintentional weight loss, SPEP --> pending. Has been following w/ hematology last visit 09/26/23 for anemia and lung nodule, flow cytometry --> neg, CT chest --> pending    04/29: admitted to hospitalist service w/ sepsis, pna, uti, encephalopathy. ICU consult but he did not need pressors 04/30: await cultures, CT chest to eval PNA/other RML. Alert today, baseline mentation.  05/01: CT (+)cavitary pneumonia. 1 blood culture (+)MRSA.  Patient was requesting to leave AMA, see progress note and IPAL note.  Will stay at least through tonight.  ID considering for p.o. antibiotics in case patient is wanting to go home tomorrow 05/02: pt has reconsidered for treatment, he states he "made a mistake" yesterday and is now open to continuing IV antibiotics and going ot rehab hopefully same facility as his wife will be going to post-op. Palliative to follow either way.  05/03-05/04: repeat BCx  negative thus far, continue current IV abx, placement to follow after the weekend  05/05: liver enzymes continue to elevate as well as alk phos, WBC also worsening, pt reports more fatigued today, coughing more toady/yesterday, but no other symptoms to explain findings, CT chest/abd/pelv --> d/w radiologist, (+)PE nonocclusive and no apparent RV strain, worsening cavitary lung lesion w/u as below and pulm consult, consult w/ cardio re: TEE vs TTE given bacteremia and now PE, started heparin  and dc lovenox . Spoke w/ CT surgery, not a candidate, they recommend pulm to consider for bronch if able.  05/06: TEE - no vegetation, TTE pending to fully eval EF. Continuing IV abx and await cultures.  10/06/23, pt states he wants us  to do "whatever it takes to make me well," but maintain DNR if decompensates to arrest  --today, Pt refused meds and told RN and me that he doesn't want to be a burden to his wife and wants us  to leave him alone and let him die.     Palliative care was messaged to speak with patient at bedside.     Summary of counseling/coordination of care: Extensive chart review completed prior to meeting patient including labs, vital signs, imaging, progress notes, orders, and available advanced directive documents from current and previous encounters.   After reviewing the patient's chart and assessing the patient at bedside, I spoke with patient in regards to symptom management and goals of care.   Elderly male resting  in bed. He is A&O, calm and pleasant. Even, unlabored respirations. He is in no distress.   When discussing events from yesterday when he was refusing treatment, patient states "today is another day." He is otherwise evasive and requests to take nap but is appreciative for the visit.  Briefly educated on leg exercises that can be done in bed while he is on isolation.   Therapeutic silence and active listening provided for patient to share his thoughts and emotions regarding  current medical situation.  Emotional support provided.  Physical Exam Vitals reviewed.  Constitutional:      General: He is not in acute distress.    Appearance: He is not ill-appearing.  HENT:     Head: Normocephalic and atraumatic.  Pulmonary:     Effort: Pulmonary effort is normal. No respiratory distress.  Musculoskeletal:     Right lower leg: No edema.     Left lower leg: No edema.  Skin:    General: Skin is warm and dry.  Neurological:     Mental Status: He is alert and oriented to person, place, and time.  Psychiatric:        Mood and Affect: Mood normal.        Behavior: Behavior normal.        Thought Content: Thought content normal.        Judgment: Judgment normal.            Recommendations/Plan: Continue DNR/DNI status as previously documented    Family to speak with patient tomorrow PMT to follow for continued goals of care conversation    Total Time 25 minutes   Time spent includes: Detailed review of medical records (labs, imaging, vital signs), medically appropriate exam (mental status, respiratory, cardiac, skin), discussed with treatment team, counseling and educating patient, family and staff, documenting clinical information, medication management and coordination of care.     Ina Manas, Joyice Nodal- [facility] Palliative Medicine Team  10/12/2023 11:21 AM  Office [phone]  Pager [phone]

## 2023-10-13 DIAGNOSIS — Z515 Encounter for palliative care: Secondary | ICD-10-CM | POA: Diagnosis not present

## 2023-10-13 DIAGNOSIS — G9341 Metabolic encephalopathy: Secondary | ICD-10-CM | POA: Diagnosis not present

## 2023-10-13 DIAGNOSIS — R6520 Severe sepsis without septic shock: Secondary | ICD-10-CM | POA: Diagnosis not present

## 2023-10-13 DIAGNOSIS — J850 Gangrene and necrosis of lung: Secondary | ICD-10-CM | POA: Diagnosis not present

## 2023-10-13 DIAGNOSIS — Z7189 Other specified counseling: Secondary | ICD-10-CM | POA: Diagnosis not present

## 2023-10-13 DIAGNOSIS — N390 Urinary tract infection, site not specified: Secondary | ICD-10-CM | POA: Diagnosis not present

## 2023-10-13 DIAGNOSIS — B9562 Methicillin resistant Staphylococcus aureus infection as the cause of diseases classified elsewhere: Secondary | ICD-10-CM | POA: Diagnosis not present

## 2023-10-13 DIAGNOSIS — R7881 Bacteremia: Secondary | ICD-10-CM | POA: Diagnosis not present

## 2023-10-13 DIAGNOSIS — J439 Emphysema, unspecified: Secondary | ICD-10-CM | POA: Diagnosis not present

## 2023-10-13 DIAGNOSIS — A419 Sepsis, unspecified organism: Secondary | ICD-10-CM | POA: Diagnosis not present

## 2023-10-13 LAB — BASIC METABOLIC PANEL WITH GFR
Anion gap: 7 (ref 5–15)
BUN: 38 mg/dL — ABNORMAL HIGH (ref 8–23)
CO2: 21 mmol/L — ABNORMAL LOW (ref 22–32)
Calcium: 7.9 mg/dL — ABNORMAL LOW (ref 8.9–10.3)
Chloride: 106 mmol/L (ref 98–111)
Creatinine, Ser: 1.5 mg/dL — ABNORMAL HIGH (ref 0.61–1.24)
GFR, Estimated: 47 mL/min — ABNORMAL LOW (ref >60)
Glucose, Bld: 81 mg/dL (ref 70–99)
Potassium: 4.7 mmol/L (ref 3.5–5.1)
Sodium: 134 mmol/L — ABNORMAL LOW (ref 135–145)

## 2023-10-13 LAB — CBC
HCT: 27.8 % — ABNORMAL LOW (ref 39.0–52.0)
Hemoglobin: 9 g/dL — ABNORMAL LOW (ref 13.0–17.0)
MCH: 32.8 pg (ref 26.0–34.0)
MCHC: 32.4 g/dL (ref 30.0–36.0)
MCV: 101.5 fL — ABNORMAL HIGH (ref 80.0–100.0)
Platelets: 444 10*3/uL — ABNORMAL HIGH (ref 150–400)
RBC: 2.74 MIL/uL — ABNORMAL LOW (ref 4.22–5.81)
RDW: 15.2 % (ref 11.5–15.5)
WBC: 13 10*3/uL — ABNORMAL HIGH (ref 4.0–10.5)
nRBC: 0 % (ref 0.0–0.2)

## 2023-10-13 MED ORDER — LINEZOLID 600 MG PO TABS
600.0000 mg | ORAL_TABLET | Freq: Two times a day (BID) | ORAL | Status: DC
  Administered 2023-10-13 – 2023-10-14 (×2): 600 mg via ORAL
  Filled 2023-10-13 (×2): qty 1

## 2023-10-13 NOTE — TOC Progression Note (Signed)
 Transition of Care [facility]) - Progression Note    Patient Details  Name: Zachary [name] MRN: [id number] Date of Birth: 1940-11-25  [facility] of [facility]) CM/SW Contact  Alexandra Ice, RN Phone Number: 10/13/2023, 12:02 PM  Clinical Narrative:    [facility] is able to accept patient, will initiate auth.    Expected Discharge Plan: Skilled Nursing Facility Barriers to Discharge: Patient left Against Medical Advice [facility])  Expected Discharge Plan and Services     Post Acute Care Choice: Skilled Nursing Facility Living arrangements for the past 2 months: Single Family Home                           HH Arranged: RN, PT, OT, Nurse's Aide HH Agency: [facility] Date [facility] Agency Contacted: 10/02/23   Representative spoke with at [facility] Agency: Verdis Glade   Social Determinants of Health (SDOH) Interventions SDOH Screenings   Food Insecurity: No Food Insecurity (09/30/2023)  Housing: Low Risk  (09/30/2023)  Transportation Needs: No Transportation Needs (09/30/2023)  Utilities: Not At Risk (09/30/2023)  Alcohol  Screen: Low Risk  (09/25/2021)  Depression (PHQ2-9): Low Risk  (09/26/2023)  Financial Resource Strain: Low Risk  (09/25/2021)  Physical Activity: Insufficiently Active (09/25/2021)  Social Connections: Moderately Isolated (09/30/2023)  Stress: No Stress Concern Present (11/24/2019)  Tobacco Use: Low Risk  (09/30/2023)    Readmission Risk Interventions    10/01/2023    2:47 PM  Readmission Risk Prevention Plan  PCP or Specialist Appt within 3-5 Days Complete  Social Work Consult for Recovery Care Planning/Counseling Complete  Palliative Care Screening Not Applicable

## 2023-10-13 NOTE — TOC Progression Note (Signed)
 Transition of Care [facility]) - Progression Note    Patient Details  Name: Zachary [name] MRN: [id number] Date of Birth: [birth year]  Transition of Care [facility]) CM/SW Contact  Alexandra Ice, RN Phone Number: 10/13/2023, 2:34 PM  Clinical Narrative:    [facility]ieg[facility] Dress initiated for SNF, pending auth number, [id number]    Expected Discharge Plan: Skilled Nursing Facility Barriers to Discharge: Patient left Against Medical Advice [facility])  Expected Discharge Plan and Services     Post Acute Care Choice: Skilled Nursing Facility Living arrangements for the past 2 months: Single Family Home                           HH Arranged: RN, PT, OT, Nurse's Aide HH Agency: [facility] Date [facility] Agency Contacted: 10/02/23   Representative spoke with at [facility] Agency: Verdis Glade   Social Determinants of Health (SDOH) Interventions SDOH Screenings   Food Insecurity: No Food Insecurity (09/30/2023)  Housing: Low Risk  (09/30/2023)  Transportation Needs: No Transportation Needs (09/30/2023)  Utilities: Not At Risk (09/30/2023)  Alcohol  Screen: Low Risk  (09/25/2021)  Depression (PHQ2-9): Low Risk  (09/26/2023)  Financial Resource Strain: Low Risk  (09/25/2021)  Physical Activity: Insufficiently Active (09/25/2021)  Social Connections: Moderately Isolated (09/30/2023)  Stress: No Stress Concern Present (11/24/2019)  Tobacco Use: Low Risk  (09/30/2023)    Readmission Risk Interventions    10/01/2023    2:47 PM  Readmission Risk Prevention Plan  PCP or Specialist Appt within 3-5 Days Complete  Social Work Consult for Recovery Care Planning/Counseling Complete  Palliative Care Screening Not Applicable

## 2023-10-13 NOTE — Progress Notes (Signed)
 Palliative Care Progress Note, Assessment & Plan   Patient Name: Zachary [name]       Date: 10/13/2023 DOB: December 24, 1940  Age: 83 y.o. MRN#: [id number] Attending Physician: Garrison Kanner, MD Primary Care Physician: [name], MD Admit Date: 09/30/2023  Subjective: Pt sitting upright in bed. Denies pain. Feeling better today. States he slept well last night. He is eating most of his meals. Denies CP/SOB. Patient states he does not know why he said he no longer wanted treatment 2 days ago. Wants to get back home to his wife.   HPI: Zachary [name] is a 83 y.o. male with medical history significant of HTN, urinary retention s/p suprapubic catheter placement,  HLD. Presents to ED from home via EMS w/ weakness.    Of note, hospitalized last month 03/04-03/07/25 w/ substantial urinary retention and AKI, Foley placed, f/u urology and suprapubic cath was placed 08/26/23. Also notable, has been following w/ PCP last visit 09/24/23 concern for unintentional weight loss, SPEP --> pending. Has been following w/ hematology last visit 09/26/23 for anemia and lung nodule, flow cytometry --> neg, CT chest --> pending    04/29: admitted to hospitalist service w/ sepsis, pna, uti, encephalopathy. ICU consult but he did not need pressors 04/30: await cultures, CT chest to eval PNA/other RML. Alert today, baseline mentation.  05/01: CT (+)cavitary pneumonia. 1 blood culture (+)MRSA.  Patient was requesting to leave AMA, see progress note and IPAL note.  Will stay at least through tonight.  ID considering for p.o. antibiotics in case patient is wanting to go home tomorrow 05/02: pt has reconsidered for treatment, he states he "made a mistake" yesterday and is now open to continuing IV antibiotics and going ot rehab  hopefully same facility as his wife will be going to post-op. Palliative to follow either way.  05/03-05/04: repeat BCx negative thus far, continue current IV abx, placement to follow after the weekend  05/05: liver enzymes continue to elevate as well as alk phos, WBC also worsening, pt reports more fatigued today, coughing more toady/yesterday, but no other symptoms to explain findings, CT chest/abd/pelv --> d/w radiologist, (+)PE nonocclusive and no apparent RV strain, worsening cavitary lung lesion w/u as below and pulm consult, consult w/ cardio re: TEE vs TTE given bacteremia and now PE, started heparin  and dc lovenox . Spoke w/ CT surgery, not a candidate, they recommend pulm to consider for bronch if able.  05/06: TEE - no vegetation, TTE pending to fully eval EF. Continuing IV abx and await cultures.  10/06/23, pt states he wants us  to do "whatever it takes to make me well," but maintain DNR if decompensates to arrest  --today, Pt refused meds and told RN and me that he doesn't want to be a burden to his wife and wants us  to leave him alone and let him die. 5/11: Patient now wants to receive treatment and no longer wants to die.     Palliative care was messaged to speak with patient at bedside.     Summary of counseling/coordination of care: Extensive chart review completed prior to meeting patient including labs, vital signs, imaging, progress notes, orders, and available advanced directive documents from current  and previous encounters.   After reviewing the patient's chart and assessing the patient at bedside, I spoke with patient in regards to symptom management and goals of care.   Elderly male sitting upright in bed eating breakfast. Appears more well today than in previous visits. He is A&O, calm and pleasant. Even, unlabored respirations. He is in no distress.   During visit, patient shares that he does not know why he "acted that way a few days ago". Discussed that his  hospitalization has been stressful for him and complicated by being in isolation. He is remorseful of his previous actions. He endorses wanting to get better so he may return home to his wife.   Patient is appreciative of visit and checking on him.   Therapeutic silence and active listening provided for patient to share his thoughts and emotions regarding current medical situation.  Emotional support provided.  Physical Exam Vitals reviewed.  Constitutional:      General: He is not in acute distress.    Appearance: He is not ill-appearing.  HENT:     Head: Normocephalic and atraumatic.     Mouth/Throat:     Mouth: Mucous membranes are moist.  Pulmonary:     Effort: Pulmonary effort is normal. No respiratory distress.  Musculoskeletal:     Right lower leg: No edema.     Left lower leg: No edema.  Skin:    General: Skin is warm and dry.  Neurological:     Mental Status: He is alert and oriented to person, place, and time.  Psychiatric:        Mood and Affect: Mood normal.        Behavior: Behavior normal.        Thought Content: Thought content normal.        Judgment: Judgment normal.    Recommendations/Plan: Continue DNR/DNI status as previously documented    Continue current medical treatment D/C to SNF once medical stable       Total Time 25 minutes   Time spent includes: Detailed review of medical records (labs, imaging, vital signs), medically appropriate exam (mental status, respiratory, cardiac, skin), discussed with treatment team, counseling and educating patient, family and staff, documenting clinical information, medication management and coordination of care.     Ina Manas, Joyice [facility] Palliative Medicine Team  10/13/2023 11:40 AM  Office [phone]  Pager [phone]

## 2023-10-13 NOTE — Progress Notes (Signed)
 PROGRESS NOTE    Zachary [name]  MWU:[id number] DOB: 1940-08-23 DOA: 09/30/2023 PCP: [name], MD  133A/133A-AA  LOS: 13 days   Brief hospital course:   Assessment & Plan: Zachary [name] is a 83 y.o. male with medical history significant of HTN, urinary retention s/p suprapubic catheter placement,  HLD. Presents to ED from home via EMS w/ weakness.    Of note, hospitalized last month 03/04-03/07/25 w/ substantial urinary retention and AKI, Foley placed, f/u urology and suprapubic cath was placed 08/26/23. Also notable, has been following w/ PCP last visit 09/24/23 concern for unintentional weight loss, SPEP --> pending. Has been following w/ hematology last visit 09/26/23 for anemia and lung nodule, flow cytometry --> neg, CT chest --> pending    04/29: admitted to hospitalist service w/ sepsis, pna, uti, encephalopathy. ICU consult but he did not need pressors 04/30: await cultures, CT chest to eval PNA/other RML. Alert today, baseline mentation.  05/01: CT (+)cavitary pneumonia. 1 blood culture (+)MRSA.  Patient was requesting to leave AMA, see progress note and IPAL note.  Will stay at least through tonight.  ID considering for p.o. antibiotics in case patient is wanting to go home tomorrow 05/02: pt has reconsidered for treatment, he states he "made a mistake" yesterday and is now open to continuing IV antibiotics and going ot rehab hopefully same facility as his wife will be going to post-op. Palliative to follow either way.  05/03-05/04: repeat BCx negative thus far, continue current IV abx, placement to follow after the weekend  05/05: liver enzymes continue to elevate as well as alk phos, WBC also worsening, pt reports more fatigued today, coughing more toady/yesterday, but no other symptoms to explain findings, CT chest/abd/pelv --> d/w radiologist, (+)PE nonocclusive and no apparent RV strain, worsening cavitary lung lesion w/u as below and pulm consult, consult w/  cardio re: TEE vs TTE given bacteremia and now PE, started heparin  and dc lovenox . Spoke w/ CT surgery, not a candidate, they recommend pulm to consider for bronch if able.  05/06: TEE - no vegetation, TTE pending to fully eval EF. Continuing IV abx and await cultures.   Goals of care / Advanced care planning Palliative care consult to facilitate GOC discussion  10/06/23, pt states he wants us  to do "whatever it takes to make me well," but maintain DNR if decompensates to arrest  --on 5/10, Pt refused meds and told RN and me that he didn't want to be a burden to his wife and wanted us  to leave him alone and let him die.  Palliative care provider saw pt.  next morning 5/11, pt again changed his mind and wanted to live and continue medical tx.  Sepsis POA Worsening leukocytosis likely d/t infection Treating underlying infections as below    Cavitary pneumonia RML Likely source for MRSA bacteremia Concern for necrotizing pneumonia  Less likely malignancy but unable to rule this out  Cavitary lesion appears worse on CT 10/06/23  Infectious disease and pulm consulted Per CT surgery, not a candidate for any intervention on their end will need follow up CT chest in 4 weeks to determine need for biopsy.  --first AFB smear neg --started on vanc, switched to Linezolid .  Refused 1 dose morning of 5/10 --cont Linezolid , will discharge on 2 more weeks --need weekly cbc cmp  --CT chest in 4 weeks and outpatient pulm f/u   R pleural effusion, small  Pulm performed bedside ultrasound 05/05, enlarged today --right thoracentesis on  5/7 with fluid studies   MRSA bacteremia Infectious Disease following  --TEE neg endocarditis --started on vanc, switched to Linezolid .  Refused 1 dose morning of 5/10 --cont Linezolid , will discharge on 2 more weeks --need weekly cbc cmp    ESBL UTI Suprapubic catheter d/t retention, cath was exchanged early admission --completed a course of meropenem  Urology to  follow outpatient    Pulmonary Embolism dx 10/06/23  No evidence on CT for RV strain per radiology --cont Eliquis    Elevated transaminase --no symptoms to give suspicion for GB disease or biliary obstruction CT chest/abd/pelvis - nothing to explain transaminitis  CK neg for rhabdo    Elevated troponin likely demand ischemis in setting of sepsis, less likely NSTEMI (non-ST elevated myocardial infarction) - on admission Secondary demand ischemia d/t sepsis Poor renal clearance also confounding issue   AKI CKD (chronic kidney disease) stage 4, GFR 15-29 ml/min  Creatinine 2.4 on presentation with GFR in the 20s --oral hydration    Acute encephalopathy POA - resolved  Positive generalized lethargy in setting of active sepsis with recurrent multifocal pneumonia and UTI CT head within normal  Monitor    Essential hypertension --hold BP meds due to hypotension  Hypotension --cont midodrine  (new)    Mixed hyperlipidemia hold lipitor    underweight based on BMI: Body mass index is 16.05 kg/m.  Anemia --Hgb stable around 8-9's --monitor     DVT prophylaxis: [id number] Code Status: DNR  Family Communication:  Level of care: Med-Surg Dispo:   The patient is from: home Anticipated d/c is to: SNF rehab Anticipated d/c date is: tomorrow   Subjective and Interval History:  Pt wanted reassurance today that his medical problems are being treated and that he is improving.   Objective: Vitals:   10/12/23 1949 10/13/23 0519 10/13/23 0804 10/13/23 1534  BP: 107/76 114/70 110/71 109/73  Pulse: 73 62 64 73  Resp: 18 20 16 16   Temp: 98.2 F (36.8 C) 97.8 F (36.6 C) 97.7 F (36.5 C) (!) 97.5 F (36.4 C)  TempSrc:      SpO2: 99% 100% 97% 100%  Weight:      Height:        Intake/Output Summary (Last 24 hours) at 10/13/2023 1841 Last data filed at 10/13/2023 1300 Gross per 24 hour  Intake 657.34 ml  Output 850 ml  Net -192.66 ml   Filed Weights   09/30/23 0457   Weight: 56.7 kg    Examination:   Constitutional: NAD, AAOx3 HEENT: conjunctivae and lids normal, EOMI CV: No cyanosis.   RESP: normal respiratory effort, on RA Neuro: II - XII grossly intact.     Data Reviewed: I have personally reviewed labs and imaging studies  Time spent: 35 minutes  Zachary Kanner, MD [facility] If 7PM-7AM, please contact night-coverage 10/13/2023, 6:41 PM

## 2023-10-13 NOTE — TOC Progression Note (Signed)
 Transition of Care [facility]) - Progression Note    Patient Details  Name: Zachary [name] MRN: [id number] Date of Birth: March 27, 1941  Transition of Care [facility]) CM/SW Contact  Alexandra Ice, RN Phone Number: 10/13/2023, 11:31 AM  Clinical Narrative:    Received message from MD, patient is medically ready. Sent message to [place] with [facility], confirming they are still able to accept patient, before initiating auth. Awaiting response.    Expected Discharge Plan: Skilled Nursing Facility Barriers to Discharge: Patient left Against Medical Advice [facility])  Expected Discharge Plan and Services     Post Acute Care Choice: Skilled Nursing Facility Living arrangements for the past 2 months: Single Family Home                           HH Arranged: RN, PT, OT, Nurse's Aide HH Agency: [facility] Date [facility] Agency Contacted: 10/02/23   Representative spoke with at [facility] Agency: Verdis Glade   Social Determinants of Health (SDOH) Interventions SDOH Screenings   Food Insecurity: No Food Insecurity (09/30/2023)  Housing: Low Risk  (09/30/2023)  Transportation Needs: No Transportation Needs (09/30/2023)  Utilities: Not At Risk (09/30/2023)  Alcohol  Screen: Low Risk  (09/25/2021)  Depression (PHQ2-9): Low Risk  (09/26/2023)  Financial Resource Strain: Low Risk  (09/25/2021)  Physical Activity: Insufficiently Active (09/25/2021)  Social Connections: Moderately Isolated (09/30/2023)  Stress: No Stress Concern Present (11/24/2019)  Tobacco Use: Low Risk  (09/30/2023)    Readmission Risk Interventions    10/01/2023    2:47 PM  Readmission Risk Prevention Plan  PCP or Specialist Appt within 3-5 Days Complete  Social Work Consult for Recovery Care Planning/Counseling Complete  Palliative Care Screening Not Applicable

## 2023-10-13 NOTE — Progress Notes (Signed)
 Occupational Therapy Treatment Patient Details Name: Zachary [name] MRN: [id number] DOB: Sep 29, 1940 Today's Date: 10/13/2023   History of present illness Zachary [name] is an 82yoM who comes to [facility] with acute weakness at home, s/p recent fall, no significant injury sustained. Pt still generally weak from his admission last month. + cavitary pneumonia and mRSA. PMH: HLD, HTN, CAD, menieres disease, B12 deficiency, suprapubic catheter.   OT comments  Pt received in bed, pleasant and agreeable to tx. Session focused on functional mobility and increasing activity tolerance. Pt able to complete bed mobility with use of bed features and increased time at supervision level, requires cues for hand placement for sit<>stand transfers and MIN A to rise upright to standing. Functional mobility 15 ft x 2 using RW, 1 seated recovery break due to fatigue. Pt mildly impulsive at times, is responsive to redirection with min vcs. Left in recliner, needs in reach. Pt demonstrates impairments in activity tolerance, strength, balance which impact safe, efficient performance of ADLs and mobility. Pt will continue to benefit from skilled OT services, OT will continue to progress for functional gains. Patient will benefit from continued inpatient follow up therapy, <3 hours/day.      If plan is discharge home, recommend the following:  A little help with walking and/or transfers;A little help with bathing/dressing/bathroom;Assistance with cooking/housework;Assist for transportation;Help with stairs or ramp for entrance   Equipment Recommendations  Other (comment)    Recommendations for Other Services      Precautions / Restrictions Precautions Precautions: Fall Recall of Precautions/Restrictions: Intact Precaution/Restrictions Comments: suprapubic cath Restrictions Weight Bearing Restrictions Per Provider Order: No       Mobility Bed Mobility Overal bed mobility: Needs Assistance       Supine to  sit: HOB elevated, Used rails, Supervision          Transfers Overall transfer level: Needs assistance Equipment used: Rolling walker (2 wheels) Transfers: Sit to/from Stand Sit to Stand: Min assist           General transfer comment: cues to push up from bed and minA to fully rise upright     Balance Overall balance assessment: Needs assistance Sitting-balance support: Feet supported Sitting balance-Leahy Scale: Good     Standing balance support: Reliant on assistive device for balance, During functional activity, Bilateral upper extremity supported Standing balance-Leahy Scale: Fair Standing balance comment: reliant on RW                           ADL either performed or assessed with clinical judgement   ADL Overall ADL's : Needs assistance/impaired                                     Functional mobility during ADLs: Contact guard assist;Rolling walker (2 wheels);Cueing for sequencing;Cueing for safety (approx 2x 15 ft attempts, cues for RW technique) General ADL Comments: Pt declines ADL performance this date; requests to work on Management consultant Communication: Impaired Factors Affecting Communication: Hearing impaired   Cognition Arousal: Alert Behavior During Therapy: WFL for tasks assessed/performed Cognition: No apparent impairments                               Following commands: Intact        Cueing   Cueing Techniques: Verbal cues  Pertinent Vitals/ Pain       Pain Assessment Pain Assessment: No/denies pain   Frequency  Min 2X/week        Progress Toward Goals  OT Goals(current goals can now be found in the care plan section)  Progress towards OT goals: Progressing toward goals  Acute Rehab OT Goals OT Goal Formulation: With patient Time For Goal Achievement: 10/15/23 Potential to Achieve Goals: Fair ADL Goals Pt Will Perform Grooming: with modified  independence;standing Pt Will Perform Lower Body Dressing: with modified independence;sit to/from stand Pt Will Transfer to Toilet: with modified independence;ambulating Pt Will Perform Toileting - Clothing Manipulation and hygiene: with modified independence;sit to/from stand  Plan      Co-evaluation                 AM-PAC OT "6 Clicks" Daily Activity     Outcome Measure   Help from another person eating meals?: None Help from another person taking care of personal grooming?: None Help from another person toileting, which includes using toliet, bedpan, or urinal?: A Little Help from another person bathing (including washing, rinsing, drying)?: A Little Help from another person to put on and taking off regular upper body clothing?: None Help from another person to put on and taking off regular lower body clothing?: A Little 6 Click Score: 21    End of Session Equipment Utilized During Treatment: Gait belt;Rolling walker (2 wheels)  OT Visit Diagnosis: Unsteadiness on feet (R26.81);Muscle weakness (generalized) (M62.81)   Activity Tolerance Patient tolerated treatment well   Patient Left in chair;with call bell/phone within reach;with chair alarm set   Nurse Communication Mobility status        Time: 8295-6213 OT Time Calculation (min): 19 min  Charges: OT General Charges $OT Visit: 1 Visit OT Treatments $Therapeutic Activity: 8-22 mins  Zachary [name] L. Juan Kissoon, OTR/L  10/13/23, 1:09 PM

## 2023-10-13 NOTE — Plan of Care (Signed)
  Problem: Clinical Measurements: Goal: Diagnostic test results will improve Outcome: Progressing   Problem: Clinical Measurements: Goal: Diagnostic test results will improve Outcome: Progressing   Problem: Activity: Goal: Risk for activity intolerance will decrease Outcome: Progressing   Problem: Coping: Goal: Level of anxiety will decrease Outcome: Progressing

## 2023-10-14 DIAGNOSIS — I10 Essential (primary) hypertension: Secondary | ICD-10-CM | POA: Diagnosis not present

## 2023-10-14 DIAGNOSIS — R531 Weakness: Secondary | ICD-10-CM | POA: Diagnosis not present

## 2023-10-14 DIAGNOSIS — I950 Idiopathic hypotension: Secondary | ICD-10-CM | POA: Diagnosis not present

## 2023-10-14 DIAGNOSIS — R110 Nausea: Secondary | ICD-10-CM | POA: Diagnosis not present

## 2023-10-14 DIAGNOSIS — Z9181 History of falling: Secondary | ICD-10-CM | POA: Diagnosis not present

## 2023-10-14 DIAGNOSIS — N184 Chronic kidney disease, stage 4 (severe): Secondary | ICD-10-CM | POA: Diagnosis present

## 2023-10-14 DIAGNOSIS — J984 Other disorders of lung: Secondary | ICD-10-CM | POA: Diagnosis not present

## 2023-10-14 DIAGNOSIS — R262 Difficulty in walking, not elsewhere classified: Secondary | ICD-10-CM | POA: Diagnosis not present

## 2023-10-14 DIAGNOSIS — R1311 Dysphagia, oral phase: Secondary | ICD-10-CM | POA: Diagnosis not present

## 2023-10-14 DIAGNOSIS — R2689 Other abnormalities of gait and mobility: Secondary | ICD-10-CM | POA: Diagnosis not present

## 2023-10-14 DIAGNOSIS — I2585 Chronic coronary microvascular dysfunction: Secondary | ICD-10-CM | POA: Diagnosis not present

## 2023-10-14 DIAGNOSIS — D631 Anemia in chronic kidney disease: Secondary | ICD-10-CM | POA: Diagnosis present

## 2023-10-14 DIAGNOSIS — K3189 Other diseases of stomach and duodenum: Secondary | ICD-10-CM | POA: Diagnosis present

## 2023-10-14 DIAGNOSIS — R6520 Severe sepsis without septic shock: Secondary | ICD-10-CM | POA: Diagnosis not present

## 2023-10-14 DIAGNOSIS — R5383 Other fatigue: Secondary | ICD-10-CM | POA: Diagnosis not present

## 2023-10-14 DIAGNOSIS — E871 Hypo-osmolality and hyponatremia: Secondary | ICD-10-CM | POA: Diagnosis present

## 2023-10-14 DIAGNOSIS — Z681 Body mass index (BMI) 19 or less, adult: Secondary | ICD-10-CM | POA: Diagnosis not present

## 2023-10-14 DIAGNOSIS — D649 Anemia, unspecified: Secondary | ICD-10-CM | POA: Diagnosis present

## 2023-10-14 DIAGNOSIS — J850 Gangrene and necrosis of lung: Secondary | ICD-10-CM | POA: Diagnosis present

## 2023-10-14 DIAGNOSIS — R7881 Bacteremia: Secondary | ICD-10-CM | POA: Diagnosis not present

## 2023-10-14 DIAGNOSIS — J188 Other pneumonia, unspecified organism: Secondary | ICD-10-CM | POA: Diagnosis present

## 2023-10-14 DIAGNOSIS — E039 Hypothyroidism, unspecified: Secondary | ICD-10-CM | POA: Diagnosis present

## 2023-10-14 DIAGNOSIS — D696 Thrombocytopenia, unspecified: Secondary | ICD-10-CM | POA: Diagnosis present

## 2023-10-14 DIAGNOSIS — Z66 Do not resuscitate: Secondary | ICD-10-CM | POA: Diagnosis present

## 2023-10-14 DIAGNOSIS — R69 Illness, unspecified: Secondary | ICD-10-CM | POA: Diagnosis not present

## 2023-10-14 DIAGNOSIS — L8990 Pressure ulcer of unspecified site, unspecified stage: Secondary | ICD-10-CM | POA: Diagnosis not present

## 2023-10-14 DIAGNOSIS — K922 Gastrointestinal hemorrhage, unspecified: Secondary | ICD-10-CM | POA: Diagnosis not present

## 2023-10-14 DIAGNOSIS — I2699 Other pulmonary embolism without acute cor pulmonale: Secondary | ICD-10-CM | POA: Diagnosis not present

## 2023-10-14 DIAGNOSIS — I5022 Chronic systolic (congestive) heart failure: Secondary | ICD-10-CM | POA: Diagnosis present

## 2023-10-14 DIAGNOSIS — Z9351 Cutaneous-vesicostomy status: Secondary | ICD-10-CM | POA: Diagnosis not present

## 2023-10-14 DIAGNOSIS — R41841 Cognitive communication deficit: Secondary | ICD-10-CM | POA: Diagnosis not present

## 2023-10-14 DIAGNOSIS — Z7189 Other specified counseling: Secondary | ICD-10-CM | POA: Diagnosis not present

## 2023-10-14 DIAGNOSIS — Z7401 Bed confinement status: Secondary | ICD-10-CM | POA: Diagnosis not present

## 2023-10-14 DIAGNOSIS — R54 Age-related physical debility: Secondary | ICD-10-CM | POA: Diagnosis not present

## 2023-10-14 DIAGNOSIS — R040 Epistaxis: Secondary | ICD-10-CM | POA: Diagnosis not present

## 2023-10-14 DIAGNOSIS — K5939 Other megacolon: Secondary | ICD-10-CM | POA: Diagnosis present

## 2023-10-14 DIAGNOSIS — N183 Chronic kidney disease, stage 3 unspecified: Secondary | ICD-10-CM | POA: Diagnosis not present

## 2023-10-14 DIAGNOSIS — D500 Iron deficiency anemia secondary to blood loss (chronic): Secondary | ICD-10-CM | POA: Diagnosis present

## 2023-10-14 DIAGNOSIS — Z515 Encounter for palliative care: Secondary | ICD-10-CM | POA: Diagnosis not present

## 2023-10-14 DIAGNOSIS — J15212 Pneumonia due to Methicillin resistant Staphylococcus aureus: Secondary | ICD-10-CM | POA: Diagnosis present

## 2023-10-14 DIAGNOSIS — R634 Abnormal weight loss: Secondary | ICD-10-CM | POA: Diagnosis not present

## 2023-10-14 DIAGNOSIS — I130 Hypertensive heart and chronic kidney disease with heart failure and stage 1 through stage 4 chronic kidney disease, or unspecified chronic kidney disease: Secondary | ICD-10-CM | POA: Diagnosis present

## 2023-10-14 DIAGNOSIS — G9341 Metabolic encephalopathy: Secondary | ICD-10-CM | POA: Diagnosis not present

## 2023-10-14 DIAGNOSIS — K5730 Diverticulosis of large intestine without perforation or abscess without bleeding: Secondary | ICD-10-CM | POA: Diagnosis present

## 2023-10-14 DIAGNOSIS — S61411A Laceration without foreign body of right hand, initial encounter: Secondary | ICD-10-CM | POA: Diagnosis not present

## 2023-10-14 DIAGNOSIS — E785 Hyperlipidemia, unspecified: Secondary | ICD-10-CM | POA: Diagnosis not present

## 2023-10-14 DIAGNOSIS — D509 Iron deficiency anemia, unspecified: Secondary | ICD-10-CM | POA: Diagnosis not present

## 2023-10-14 DIAGNOSIS — M6281 Muscle weakness (generalized): Secondary | ICD-10-CM | POA: Diagnosis not present

## 2023-10-14 DIAGNOSIS — E43 Unspecified severe protein-calorie malnutrition: Secondary | ICD-10-CM | POA: Diagnosis present

## 2023-10-14 DIAGNOSIS — J189 Pneumonia, unspecified organism: Secondary | ICD-10-CM | POA: Diagnosis not present

## 2023-10-14 DIAGNOSIS — K222 Esophageal obstruction: Secondary | ICD-10-CM | POA: Diagnosis present

## 2023-10-14 DIAGNOSIS — I2694 Multiple subsegmental pulmonary emboli without acute cor pulmonale: Secondary | ICD-10-CM | POA: Diagnosis present

## 2023-10-14 DIAGNOSIS — A419 Sepsis, unspecified organism: Secondary | ICD-10-CM | POA: Diagnosis not present

## 2023-10-14 LAB — BASIC METABOLIC PANEL WITH GFR
Anion gap: 7 (ref 5–15)
BUN: 37 mg/dL — ABNORMAL HIGH (ref 8–23)
CO2: 23 mmol/L (ref 22–32)
Calcium: 8 mg/dL — ABNORMAL LOW (ref 8.9–10.3)
Chloride: 103 mmol/L (ref 98–111)
Creatinine, Ser: 1.48 mg/dL — ABNORMAL HIGH (ref 0.61–1.24)
GFR, Estimated: 48 mL/min — ABNORMAL LOW (ref >60)
Glucose, Bld: 86 mg/dL (ref 70–99)
Potassium: 4.6 mmol/L (ref 3.5–5.1)
Sodium: 133 mmol/L — ABNORMAL LOW (ref 135–145)

## 2023-10-14 LAB — CBC
HCT: 29.8 % — ABNORMAL LOW (ref 39.0–52.0)
Hemoglobin: 9.6 g/dL — ABNORMAL LOW (ref 13.0–17.0)
MCH: 32.5 pg (ref 26.0–34.0)
MCHC: 32.2 g/dL (ref 30.0–36.0)
MCV: 101 fL — ABNORMAL HIGH (ref 80.0–100.0)
Platelets: 508 10*3/uL — ABNORMAL HIGH (ref 150–400)
RBC: 2.95 MIL/uL — ABNORMAL LOW (ref 4.22–5.81)
RDW: 15.1 % (ref 11.5–15.5)
WBC: 12 10*3/uL — ABNORMAL HIGH (ref 4.0–10.5)
nRBC: 0 % (ref 0.0–0.2)

## 2023-10-14 LAB — ACID FAST SMEAR (AFB, MYCOBACTERIA): Acid Fast Smear: NEGATIVE

## 2023-10-14 MED ORDER — POLYSACCHARIDE IRON COMPLEX 150 MG PO CAPS: 150.0000 mg | ORAL_CAPSULE | Freq: Every day | ORAL | Status: DC

## 2023-10-14 MED ORDER — LINEZOLID 600 MG PO TABS: 600.0000 mg | ORAL_TABLET | Freq: Two times a day (BID) | ORAL | Status: DC

## 2023-10-14 MED ORDER — MIDODRINE HCL 10 MG PO TABS: 10.0000 mg | ORAL_TABLET | Freq: Three times a day (TID) | ORAL | Status: DC

## 2023-10-14 MED ORDER — APIXABAN 5 MG PO TABS: ORAL_TABLET | ORAL | Status: DC

## 2023-10-14 MED ORDER — ENSURE ENLIVE PO LIQD: 237.0000 mL | Freq: Three times a day (TID) | ORAL | Status: DC

## 2023-10-14 NOTE — Progress Notes (Signed)
 The Occupational psychologist and spoke to [place], [employer]. Report was given. Pt denies any pain or distress at this time. Will continue to monitor until pt is discharged.

## 2023-10-14 NOTE — Discharge Summary (Signed)
 Physician Discharge Summary   Zachary [name]  male DOB: 1941/05/15  NFA:[id number]  PCP: [name], MD  Admit date: 09/30/2023 Discharge date: 10/14/2023  Admitted From: home Disposition:  SNF rehab CODE STATUS: DNR  Discharge Instructions     Discharge instructions   Complete by: As directed    Pt needs weekly CBC with diff and CMP while taking Linezolid .  Please see AVS for info on Linezolid .  Please fax lab results to Dr. Francee Inch at [phone] [facility] Course:  For full details, please see H&P, progress notes, consult notes and ancillary notes.  Briefly,  Zachary [name] is a 83 y.o. male with medical history significant of HTN, urinary retention s/p suprapubic catheter placement. Presented to ED from home via EMS w/ weakness.    Of note, hospitalized last month 03/04-03/07/25 w/ substantial urinary retention and AKI, Foley placed, f/u urology and suprapubic cath was placed 08/26/23.  Also notable, has been following w/ PCP last visit 09/24/23 concern for unintentional weight loss.  Has been following w/ hematology last visit 09/26/23 for anemia and lung nodule, flow cytometry --> neg.   Sepsis POA   Cavitary pneumonia RML Likely source for MRSA bacteremia Concern for necrotizing pneumonia  --4/30: CT (+) cavitary pneumonia. 1 blood culture (+) MRSA.  Less likely malignancy but unable to rule this out.   Cavitary lesion appears worse on CT 10/06/23  --Infectious disease and pulm consulted --Per CT surgery, not a candidate for any intervention on their end --AFB smear neg --started on vanc, switched to Linezolid .   --discharged on oral Linezolid , last day 10/30/23. --need weekly CBC with diff and CMP.  Outpatient f/u with ID Dr. Francee Inch on 10/23/23 at 10:45am  --will need follow up CT chest in 4 weeks to determine need for biopsy.  --outpatient f/u with pulm Dr. Lucina Sabal 2 week after discharge.   R pleural effusion, small  --right  thoracentesis on 5/7 with 300 cc removed.  Fluid studies consistent with an exudate parpnemonic effusion. No concern for empyema.    MRSA bacteremia --TEE neg endocarditis --see above for abx   ESBL UTI Suprapubic catheter d/t retention cath was exchanged early admission --completed a course of meropenem  --Urology to follow outpatient    Pulmonary Embolism  dx 10/06/23  --No evidence on CT for RV strain per radiology --cont Eliquis   Elevated transaminase --Mild and trended down.  no symptoms to give suspicion for GB disease or biliary obstruction CT chest/abd/pelvis - nothing to explain transaminitis  CK neg for rhabdo. --home statin to be resumed after discharge.   Elevated troponin likely demand ischemis in setting of sepsis --trop 200's and trended down  AKI CKD (chronic kidney disease) stage 4, GFR 15-29 ml/min  Creatinine 2.41 on presentation with GFR in the 20s.  Cr improved to 1.48 prior to discharge.   Acute encephalopathy POA - resolved  Positive generalized lethargy in setting of active sepsis with recurrent multifocal pneumonia and UTI CT head within normal     Essential hypertension, not currently active --d/c'ed home Dyazide   Hypotension --started on midodrine  and continue.    Mixed hyperlipidemia --home statin to be resumed after discharge.   underweight based on BMI: Body mass index is 16.05 kg/m.   Anemia with iron def --Hgb stable around 9's --anemia workup pos for iron def --start oral iron supplement after discharge.  Goals of care / Advanced care planning --Palliative care consulted.  On  10/06/23, pt stated he wants us  to do "whatever it takes to make me well," but maintain DNR if decompensates to arrest. --during hospitalization, pt had considered withdrawing medical tx twice, likely influenced by his moods.  But as of 5/11, pt stated he wants to continue medical treatment and rehab to get well.   Discharge Diagnoses:  Principal  Problem:   Sepsis (HCC) Active Problems:   Mixed hyperlipidemia   Essential hypertension   Acute kidney injury superimposed on chronic kidney disease (HCC)   Pneumonia   UTI (urinary tract infection)   Acute encephalopathy   CKD (chronic kidney disease) stage 4, GFR 15-29 ml/min (HCC)   NSTEMI (non-ST elevated myocardial infarction) (HCC)   Protein-calorie malnutrition, severe   MRSA bacteremia   Cavitary pneumonia   30 Day Unplanned Readmission Risk Score    Flowsheet Row ED to Hosp-Admission (Current) from 09/30/2023 in [facility] (1A)  30 Day Unplanned Readmission Risk Score (%) 26.4 Filed at 10/14/2023 0801       This score is the patient's risk of an unplanned readmission within 30 days of being discharged (0 -100%). The score is based on dignosis, age, lab data, medications, orders, and past utilization.   Low:  0-14.9   Medium: 15-21.9   High: 22-29.9   Extreme: 30 and above         Discharge Instructions:  Allergies as of 10/14/2023   No Known Allergies      Medication List     STOP taking these medications    triamterene -hydrochlorothiazide 37.5-25 MG capsule Commonly known as: DYAZIDE       TAKE these medications    acetaminophen  325 MG tablet Commonly known as: TYLENOL  Take 2 tablets (650 mg total) by mouth every 6 (six) hours as needed for mild pain (pain score 1-3), headache or fever (or Fever >/= 101).   apixaban 5 MG Tabs tablet Commonly known as: ELIQUIS Take 2 tablets (10 mg total) by mouth 2 (two) times daily for 3 days, THEN 1 tablet (5 mg total) 2 (two) times daily. Start taking on: Oct 14, 2023   aspirin  EC 81 MG tablet Take 81 mg by mouth at bedtime. Pt states he has resumed as of 09/26/23   atorvastatin  10 MG tablet Commonly known as: LIPITOR Take 10 mg by mouth daily.   calcium  carbonate 1250 (500 Ca) MG tablet Commonly known as: OS-CAL - dosed in mg of elemental calcium  Take 1 tablet (1,250 mg  total) by mouth 2 (two) times daily with a meal.   feeding supplement Liqd Take 237 mLs by mouth 3 (three) times daily between meals.   iron polysaccharides 150 MG capsule Commonly known as: NIFEREX Take 1 capsule (150 mg total) by mouth daily. Can be any iron supplement. Start taking on: Oct 15, 2023   linezolid  600 MG tablet Commonly known as: ZYVOX  Take 1 tablet (600 mg total) by mouth every 12 (twelve) hours for 17 days.   midodrine  10 MG tablet Commonly known as: PROAMATINE  Take 1 tablet (10 mg total) by mouth 3 (three) times daily with meals.   multivitamin with minerals Tabs tablet Take 1 tablet by mouth daily.         Contact information for follow-up providers     Custovic, Sabina, DO. Go in 4 week(s).   Specialty: Cardiology Contact information: [address] [place] [address] [phone]         [name], MD Follow up.  Specialty: Family Medicine Contact information: [address] [place] [address] [place] [address] [phone]              Contact information for after-discharge care     Destination     [facility] .   Service: Skilled Nursing Contact information: [address] [place]  [address] [phone]                     No Known Allergies   The results of significant diagnostics from this hospitalization (including imaging, microbiology, ancillary and laboratory) are listed below for reference.   Consultations:   Procedures/Studies: DG Chest Port 1 View Result Date: 10/10/2023 CLINICAL DATA:  Pleural effusion. EXAM: PORTABLE CHEST 1 VIEW COMPARISON:  Oct 08, 2023. FINDINGS: Stable cardiomediastinal silhouette. Stable cavitating lesion seen in right lung. Small pleural effusions are noted. Stable hiatal hernia. IMPRESSION: Stable large cavitating lesions seen in right lung base. Small pleural effusions. Stable hiatal hernia. Electronically Signed   By:  Rosalene Colon M.D.   On: 10/10/2023 13:18   ECHOCARDIOGRAM COMPLETE Result Date: 10/09/2023    ECHOCARDIOGRAM REPORT   Patient Name:   Zachary [name] Date of Exam: 10/08/2023 Medical Rec #:  [id number]           Height:       74.0 in Accession #:    [id number]          Weight:       125.0 lb Date of Birth:  1940/09/22           BSA:          1.780 m Patient Age:    82 years            BP:           105/63 mmHg Patient Gender: M                   HR:           100 bpm. Exam Location:  [facility] Procedure: 2D Echo, Cardiac Doppler and Color Doppler (Both Spectral and Color            Flow Doppler were utilized during procedure). Indications:     Pulm embolis  History:         Patient has no prior history of Echocardiogram examinations.                  CAD; Risk Factors:Hypertension.  Sonographer:     Gaston Karvonen, FE, PE Referring Phys:  [id number] CARALYN HUDSON Diagnosing Phys: Lida Reeks Alluri IMPRESSIONS  1. Left ventricular ejection fraction, by estimation, is 35 to 40%. The left ventricle has moderately decreased function. The left ventricle demonstrates global hypokinesis. There is mild left ventricular hypertrophy. Left ventricular diastolic parameters are indeterminate.  2. Right ventricular systolic function is normal. The right ventricular size is normal.  3. The mitral valve is normal in structure. Mild mitral valve regurgitation.  4. Low flow low gradient, mild to moderate aortic stenosis. The aortic valve is tricuspid. Aortic valve regurgitation is mild to moderate.  5. There is mild dilatation of the aortic root, measuring 42 mm. There is mild dilatation of the ascending aorta, measuring 41 mm. FINDINGS  Left Ventricle: Left ventricular ejection fraction, by estimation, is 35 to 40%. The left ventricle has moderately decreased function. The left ventricle demonstrates global hypokinesis. The left ventricular internal cavity size was normal in size.  There is mild left ventricular hypertrophy. Left  ventricular diastolic parameters are indeterminate. Right Ventricle: The right ventricular size is normal. No increase in right ventricular wall thickness. Right ventricular systolic function is normal. Left Atrium: Left atrial size was normal in size. Right Atrium: Right atrial size was normal in size. Pericardium: Trivial pericardial effusion is present. The pericardial effusion is anterior to the right ventricle. Mitral Valve: The mitral valve is normal in structure. Mild mitral valve regurgitation. Tricuspid Valve: The tricuspid valve is normal in structure. Tricuspid valve regurgitation is mild. Aortic Valve: Low flow low gradient, mild to moderate aortic stenosis. The aortic valve is tricuspid. Aortic valve regurgitation is mild to moderate. Aortic regurgitation PHT measures 490 msec. Aortic valve mean gradient measures 12.0 mmHg. Aortic valve peak gradient measures 22.1 mmHg. Aortic valve area, by VTI measures 1.20 cm. Pulmonic Valve: The pulmonic valve was not well visualized. Pulmonic valve regurgitation is mild. Aorta: The ascending aorta was not well visualized. There is mild dilatation of the aortic root, measuring 42 mm. There is mild dilatation of the ascending aorta, measuring 41 mm. IAS/Shunts: The atrial septum is grossly normal.  LEFT VENTRICLE PLAX 2D LVIDd:         4.60 cm LVIDs:         4.10 cm LV PW:         1.20 cm LV IVS:        1.20 cm LVOT diam:     2.30 cm LV SV:         53 LV SV Index:   30 LVOT Area:     4.15 cm  RIGHT VENTRICLE RV Basal diam:  3.50 cm RV Mid diam:    3.60 cm RV S prime:     15.30 cm/s TAPSE (M-mode): 1.8 cm LEFT ATRIUM           Index        RIGHT ATRIUM           Index LA Vol (A4C): 42.1 ml 23.65 ml/m  RA Area:     18.70 cm                                    RA Volume:   52.20 ml  29.33 ml/m  AORTIC VALVE AV Area (Vmax):    1.37 cm AV Area (Vmean):   1.36 cm AV Area (VTI):     1.20 cm AV Vmax:           235.00 cm/s AV Vmean:          161.000 cm/s AV VTI:             0.444 m AV Peak Grad:      22.1 mmHg AV Mean Grad:      12.0 mmHg LVOT Vmax:         77.40 cm/s LVOT Vmean:        52.800 cm/s LVOT VTI:          0.128 m LVOT/AV VTI ratio: 0.29 AI PHT:            490 msec  AORTA Ao Root diam: 4.20 cm Ao Asc diam:  4.10 cm TRICUSPID VALVE TR Peak grad:   25.0 mmHg TR Vmax:        250.00 cm/s  SHUNTS Systemic VTI:  0.13 m Systemic Diam: 2.30 cm Joetta Mustache Electronically signed by Joetta Mustache Signature Date/Time:  10/09/2023/9:00:43 AM    Final    DG Chest Port 1 View Result Date: 10/08/2023 CLINICAL DATA:  Status post right thoracentesis. EXAM: PORTABLE CHEST 1 VIEW COMPARISON:  Oct 07, 2023. FINDINGS: No definite pneumothorax is noted status post thoracentesis. No significant residual pleural effusion is noted. As described on prior studies. IMPRESSION: No definite pneumothorax status post thoracentesis. Electronically Signed   By: Rosalene Colon M.D.   On: 10/08/2023 13:18   US  THORACENTESIS ASP PLEURAL SPACE W/IMG GUIDE Result Date: 10/08/2023 INDICATION: 83 year old male with MRSA pneumonia, lung empyema, bilateral pleural effusions. IR was requested for diagnostic and therapeutic right-sided thoracentesis. EXAM: ULTRASOUND GUIDED DIAGNOSTIC AND THERAPEUTIC THORACENTESIS MEDICATIONS: 4 cc of 1% lidocaine  COMPLICATIONS: None immediate. PROCEDURE: An ultrasound guided thoracentesis was thoroughly discussed with the patient and questions answered. The benefits, risks, alternatives and complications were also discussed. The patient understands and wishes to proceed with the procedure. Written consent was obtained. Ultrasound was performed to localize and mark an adequate pocket of fluid in the right chest. The area was then prepped and draped in the normal sterile fashion. 1% Lidocaine  was used for local anesthesia. Under ultrasound guidance a 6 Fr Safe-T-Centesis catheter was introduced. Thoracentesis was performed. The catheter was removed and a dressing applied.  FINDINGS: A total of approximately 300 cc of clear, straw-colored fluid fluid was removed. Samples were sent to the laboratory as requested by the clinical team. IMPRESSION: Successful ultrasound guided right thoracentesis yielding 300 cc of pleural fluid. Procedure performed by Lambert Pillion, PA-C Electronically Signed   By: Fernando Hoyer M.D.   On: 10/08/2023 12:27   ECHO TEE Result Date: 10/07/2023    TRANSESOPHOGEAL ECHO REPORT   Patient Name:   Zachary [name] Date of Exam: 10/07/2023 Medical Rec #:  [id number]           Height:       74.0 in Accession #:    [id number]          Weight:       125.0 lb Date of Birth:  1940-08-07           BSA:          1.780 m Patient Age:    82 years            BP:           95/62 mmHg Patient Gender: M                   HR:           51 bpm. Exam Location:  [facility] Procedure: Transesophageal Echo, Cardiac Doppler and Color Doppler (Both            Spectral and Color Flow Doppler were utilized during procedure). Indications:     Not listed on TEE check-in sheet  History:         Patient has no prior history of Echocardiogram examinations.                  Risk Factors:Hypertension.  Sonographer:     Broadus Canes Referring Phys:  [id number] CARALYN HUDSON Diagnosing Phys: Joetta Mustache PROCEDURE: After discussion of the risks and benefits of a TEE, an informed consent was obtained from the patient. The transesophogeal probe was passed without difficulty through the esophogus of the patient. Local oropharyngeal anesthetic was provided with Cetacaine . Sedation performed by different physician. The patient was monitored while under deep sedation. Image quality was good. The patient's  vital signs; including heart rate, blood pressure, and oxygen  saturation; remained stable throughout the  procedure. The patient developed no complications during the procedure.  IMPRESSIONS  1. Left ventricular ejection fraction, by estimation, is 35 to 40%. The left ventricle has moderately decreased  function.  2. Right ventricular systolic function is mildly reduced. The right ventricular size is normal.  3. Left atrial size was severely dilated. No left atrial/left atrial appendage thrombus was detected.  4. The mitral valve is normal in structure. Mild mitral valve regurgitation.  5. Low flow low gradient aortic stenosis, moderate. The aortic valve is tricuspid. Aortic valve regurgitation is mild to moderate. Conclusion(s)/Recommendation(s): No evidence of vegetation/infective endocarditis on this transesophageael echocardiogram. FINDINGS  Left Ventricle: Left ventricular ejection fraction, by estimation, is 35 to 40%. The left ventricle has moderately decreased function. The left ventricular internal cavity size was normal in size. Right Ventricle: The right ventricular size is normal. No increase in right ventricular wall thickness. Right ventricular systolic function is mildly reduced. Left Atrium: Left atrial size was severely dilated. No left atrial/left atrial appendage thrombus was detected. Right Atrium: Right atrial size was normal in size. Pericardium: There is no evidence of pericardial effusion. Mitral Valve: The mitral valve is normal in structure. Mild mitral valve regurgitation. Tricuspid Valve: The tricuspid valve is normal in structure. Tricuspid valve regurgitation is mild. Aortic Valve: Low flow low gradient aortic stenosis, moderate. The aortic valve is tricuspid. Aortic valve regurgitation is mild to moderate. Aortic valve mean gradient measures 11.5 mmHg. Aortic valve peak gradient measures 20.9 mmHg. Aortic valve area,  by VTI measures 1.40 cm. Pulmonic Valve: The pulmonic valve was normal in structure. Pulmonic valve regurgitation is trivial. Aorta: The aortic root is normal in size and structure. IAS/Shunts: No atrial level shunt detected by color flow Doppler.  LEFT VENTRICLE PLAX 2D LVOT diam:     2.60 cm LV SV:         56 LV SV Index:   32 LVOT Area:     5.31 cm  AORTIC VALVE AV  Area (Vmax):    1.46 cm AV Area (Vmean):   1.34 cm AV Area (VTI):     1.40 cm AV Vmax:           228.50 cm/s AV Vmean:          155.000 cm/s AV VTI:            0.402 m AV Peak Grad:      20.9 mmHg AV Mean Grad:      11.5 mmHg LVOT Vmax:         62.70 cm/s LVOT Vmean:        39.200 cm/s LVOT VTI:          0.106 m LVOT/AV VTI ratio: 0.26  SHUNTS Systemic VTI:  0.11 m Systemic Diam: 2.60 cm Joetta Mustache Electronically signed by Joetta Mustache Signature Date/Time: 10/07/2023/2:00:53 PM    Final    DG Chest Port 1 View Result Date: 10/07/2023 CLINICAL DATA:  Weakness and fever.  Pleural effusion. EXAM: PORTABLE CHEST 1 VIEW COMPARISON:  CT chest, abdomen, and pelvis dated 10/06/2023. Chest radiograph dated 09/30/2023. FINDINGS: The heart size and mediastinal contours are unchanged. Aortic atherosclerosis. Large cavitary lesion is again noted in the right lung with surrounding airspace opacities. Similar small bilateral pleural effusions with associated bibasilar atelectasis. Unchanged large hiatal hernia. No pneumothorax. No acute osseous abnormality. IMPRESSION: 1. Large cavitary lesion is again noted in the right lung  with surrounding airspace opacities, concerning for cavitary pneumonia, better evaluated on the prior CT chest, abdomen, and pelvis dated 10/06/2023. 2. Similar small bilateral pleural effusions with associated bibasilar atelectasis. Electronically Signed   By: Mannie Seek M.D.   On: 10/07/2023 12:42   CT CHEST ABDOMEN PELVIS W CONTRAST Result Date: 10/06/2023 CLINICAL DATA:  Worsening leukocytosis and transaminitis despite antibiotic treatment. Unintentional weight loss. Known cavitary pneumonia. Clinical concern for possible abscess or malignancy. EXAM: CT CHEST, ABDOMEN, AND PELVIS WITH CONTRAST TECHNIQUE: Multidetector CT imaging of the chest, abdomen and pelvis was performed following the standard protocol during bolus administration of intravenous contrast. RADIATION DOSE REDUCTION: This  exam was performed according to the departmental dose-optimization program which includes automated exposure control, adjustment of the mA and/or kV according to patient size and/or use of iterative reconstruction technique. CONTRAST:  80mL OMNIPAQUE  IOHEXOL  300 MG/ML  SOLN COMPARISON:  Chest CT dated 10/01/2023. Abdomen and pelvis CT dated 08/08/2023. FINDINGS: CT CHEST FINDINGS Cardiovascular: There is a moderate-sized pulmonary embolus in the distal left main pulmonary artery and extending into the proximal left upper lobe pulmonary artery without occlusion. A smaller embolus is demonstrated in a right lower lobe pulmonary artery branch. These are not sufficient to cause right heart strain. The previously demonstrated 4.5 cm ascending thoracic aorta aneurysm currently measures 4.3 cm. Enlarged central pulmonary arteries with a main pulmonary artery measuring 3.6 cm in diameter. Enlarged heart. Atheromatous calcifications, including the coronary arteries and aorta. Slight increase in size of a small pericardial effusion with a maximum thickness of 8 mm. Mediastinum/Nodes: No enlarged mediastinal, hilar, or axillary lymph nodes. Thyroid  gland, trachea, and esophagus demonstrate no significant findings. Again demonstrated is a large hiatal hernia with an intrathoracic stomach. Lungs/Pleura: Increased size and confluence of the large cavitary lesion previously demonstrated in the right upper lobe with a moderately thick, irregular peripheral rind and adjacent airspace opacity. This continues to contain some fluid, with mild improvement. This area currently measures 13.2 x 8.2 cm on image number 94/4, previously 8.6 x 6.5 cm in corresponding dimensions. Continued patchy airspace opacity in the adjacent portions of the right upper lobe. These areas have increased some in size and decreased some in density. The previously demonstrated right middle lobe nodular density has cavitated with 2 adjacent areas of cavitation  and patchy opacity currently demonstrated in that area. Small to moderate-sized bilateral pleural effusions, increased on the right and without significant change on the left. Mild bilateral lower lobe atelectasis. Musculoskeletal: Mild thoracic spine degenerative changes. No evidence of bony metastatic disease. CT ABDOMEN PELVIS FINDINGS Hepatobiliary: No focal liver abnormality is seen. No gallstones, gallbladder wall thickening, or biliary dilatation. Pancreas: Unremarkable. No pancreatic ductal dilatation or surrounding inflammatory changes. Spleen: Normal in size without focal abnormality. Adrenals/Urinary Tract: Moderate dilatation of the left renal collecting system and ureter with mild progression mild dilatation of the right renal collecting system and ureter with mild improvement. Again demonstrated is asymmetrical anterior bladder wall thickening, thicker on the right, with improvement. A suprapubic bladder catheter is currently demonstrated. Stable fat density left adrenal myelolipoma and probable lipid poor left adrenal adenoma. Unremarkable right adrenal gland. Stomach/Bowel: Extensive sigmoid and descending colon diverticulosis without evidence of diverticulitis. The appendix is not visualized. Again noted is the entire stomach within a large hiatal hernia. Unremarkable small bowel. Vascular/Lymphatic: Atheromatous arterial calcifications without aneurysm. No enlarged lymph nodes. Reproductive: Mildly enlarged and heterogeneous prostate gland with coarse calcifications. Other: No abdominal wall hernia or abnormality. No abdominopelvic  ascites. Musculoskeletal: Moderate lumbar spine degenerative changes and mild scoliosis. IMPRESSION: 1. Bilateral pulmonary emboli, as described above. These are not sufficient to cause right heart strain. 2. Increased size and confluence of the large cavitary lesion previously demonstrated in the right upper lobe with a moderately thick, irregular peripheral rind and  adjacent airspace opacity. This continues to contain some fluid, with mild improvement. This is most likely due to a cavitary pneumonia. A cavitary neoplasm is less likely since it was not present on the abdomen and pelvis CT obtained on 08/08/2023. 3. Continued patchy airspace opacity in the adjacent portions of the right upper lobe. These areas have increased some in size and decreased some in density, compatible with ongoing pneumonia. 4. The previously demonstrated right middle lobe nodular density has cavitated with 2 adjacent areas of cavitation and patchy opacity currently demonstrated in that area, also compatible with cavitary pneumonia. 5. Small to moderate-sized bilateral pleural effusions, increased on the right and without significant change on the left. 6. Mild bilateral lower lobe atelectasis. 7. Slight increase in size of a small pericardial effusion. 8. 4.3 cm ascending thoracic aorta aneurysm. Recommend annual imaging followup by CTA or MRA. This recommendation follows 2010 ACCF/AHA/AATS/ACR/ASA/SCA/SCAI/SIR/STS/SVM Guidelines for the Diagnosis and Management of Patients with Thoracic Aortic Disease. Circulation. 2010; 121: Z610-R604. Aortic aneurysm NOS (ICD10-I71.9) 9. Enlarged central pulmonary arteries, suggesting pulmonary arterial hypertension. 10. Cardiomegaly. 11. Large hiatal hernia with an intrathoracic stomach. 12. Moderate dilatation of the left renal collecting system and ureter with mild progression. Mild dilatation of the right renal collecting system and ureter with mild improvement. This remains compatible with bilateral ureteral obstruction at the level of the ureterovesical junction 13. Asymmetrical anterior bladder wall thickening, thicker on the right, with improvement. A suprapubic bladder catheter is currently demonstrated. 14. Stable left adrenal myelolipoma and probable lipid poor adenoma. 15. Extensive sigmoid and descending colon diverticulosis without evidence of  diverticulitis. 16. Mildly enlarged and heterogeneous prostate gland. 17.  Calcific coronary artery and aortic atherosclerosis. Aortic Atherosclerosis (ICD10-I70.0). Critical Value/emergent results were called by telephone at the time of interpretation on 10/06/2023 at 2:14 pm to provider Melodi Sprung , who verbally acknowledged these results. Electronically Signed   By: Catherin Closs M.D.   On: 10/06/2023 14:32   CT CHEST WO CONTRAST Result Date: 10/01/2023 CLINICAL DATA:  Weakness, fever, abnormal x-ray EXAM: CT CHEST WITHOUT CONTRAST TECHNIQUE: Multidetector CT imaging of the chest was performed following the standard protocol without IV contrast. RADIATION DOSE REDUCTION: This exam was performed according to the departmental dose-optimization program which includes automated exposure control, adjustment of the mA and/or kV according to patient size and/or use of iterative reconstruction technique. COMPARISON:  03/04/2022, 08/08/2023, 09/30/2023 FINDINGS: Cardiovascular: Unenhanced imaging of the heart is unremarkable, without significant pericardial effusion. 4.5 cm ascending thoracic aortic aneurysm. Atherosclerosis of the aorta and coronary vasculature. Assessment of the vascular lumen cannot be performed without intravenous contrast. Mediastinum/Nodes: No enlarged mediastinal or axillary lymph nodes. Thyroid  gland, trachea, and esophagus demonstrate no significant findings. Moderate hiatal hernia. Lungs/Pleura: There is a thick walled right upper lobe cavity measuring 9.2 x 7.2 x 7.5 cm, with gas fluid level. There is surrounding dense airspace disease within the right upper lobe as well as within the right middle lobe. Findings are most consistent with cavitary pneumonia, new since the 08/08/2023 CT. There are small free-flowing bilateral pleural effusions, right greater than left. Dependent consolidation within the lower lobes most consistent with atelectasis. No pneumothorax. Central airways are  patent. Upper Abdomen: Upper abdominal ascites. Musculoskeletal: No acute or destructive bony abnormalities. Reconstructed images demonstrate no additional findings. IMPRESSION: 1. Large thick walled right upper lobe cavity with gas fluid level, with dense airspace disease involving the right upper and right middle lobes, most consistent with cavitating pneumonia. This has developed since the abdominal CT 08/08/2023. 2. Small free-flowing bilateral pleural effusions. 3. Moderate hiatal hernia. 4. 4.5 cm ascending thoracic aortic aneurysm. Ascending thoracic aortic aneurysm. Recommend semi-annual imaging followup by CTA or MRA and referral to cardiothoracic surgery if not already obtained. This recommendation follows 2010 ACCF/AHA/AATS/ACR/ASA/SCA/SCAI/SIR/STS/SVM Guidelines for the Diagnosis and Management of Patients With Thoracic Aortic Disease. Circulation. 2010; 121: Z610-R604. Aortic aneurysm NOS (ICD10-I71.9) 5.  Aortic Atherosclerosis (ICD10-I70.0). 6. Upper abdominal ascites. Electronically Signed   By: Bobbye Burrow M.D.   On: 10/01/2023 20:12   DG Chest Port 1 View Result Date: 09/30/2023 CLINICAL DATA:  83 year old male with possible sepsis. EXAM: PORTABLE CHEST 1 VIEW COMPARISON:  Chest CT 03/04/2022 and earlier. FINDINGS: Portable AP supine views at 0600 hours. Chronic retrocardiac moderate to large gastric hiatal hernia. Stable cardiac size and mediastinal contours. Chronic pulmonary hyperinflation. Patchy and indistinct new multifocal right lung opacity in both the upper and lower lung. No superimposed pneumothorax, pulmonary edema, pleural effusion. No definite left lung involvement. No acute osseous abnormality identified. Nondilated gas-filled bowel in the upper abdomen. IMPRESSION: 1. Chronic pulmonary hyperinflation with indistinct new multifocal right lung opacity suspicious for developing multilobar bronchopneumonia/pneumonia in this setting. No pleural effusion. 2. Chronic moderate to  large hiatal hernia. Electronically Signed   By: Marlise Simpers M.D.   On: 09/30/2023 06:25   CT Cervical Spine Wo Contrast Result Date: 09/30/2023 CLINICAL DATA:  83 year old male status post fall in garage ester day. Generalized weakness, decreased p.o. Hematuria. Shortness of breath. EXAM: CT CERVICAL SPINE WITHOUT CONTRAST TECHNIQUE: Multidetector CT imaging of the cervical spine was performed without intravenous contrast. Multiplanar CT image reconstructions were also generated. RADIATION DOSE REDUCTION: This exam was performed according to the departmental dose-optimization program which includes automated exposure control, adjustment of the mA and/or kV according to patient size and/or use of iterative reconstruction technique. COMPARISON:  Head CT today. FINDINGS: Alignment: Maintained cervical lordosis. Cervicothoracic junction alignment is within normal limits. Bilateral posterior element alignment is within normal limits. Skull base and vertebrae: Bone mineralization is within normal limits for age. Visualized skull base is intact. No atlanto-occipital dissociation. C1 and C2 appear intact and aligned. No acute osseous abnormality identified. Soft tissues and spinal canal: No prevertebral fluid or swelling. No visible canal hematoma. Bulky calcified carotid atherosclerosis in the neck, severe on the right (series 3, image 58). Disc levels: Generally mild for age cervical spine degeneration and capacious CT appearance of the cervical spinal canal at most levels. Upper chest: Visible upper thoracic levels appear intact. Clear lung apices with questionable emphysema. Trace intravenous gas at the thoracic inlet, likely IV access related. IMPRESSION: 1. No acute traumatic injury identified in the cervical spine. Mild for age cervical spine degeneration. 2. Calcified carotid atherosclerosis in the neck, Severe at the Right ICA. Electronically Signed   By: Marlise Simpers M.D.   On: 09/30/2023 05:57   CT HEAD WO CONTRAST  ( ) Result Date: 09/30/2023 CLINICAL DATA:  83 year old male status post fall in garage ester day. Generalized weakness, decreased p.o. Hematuria. Shortness of breath. EXAM: CT HEAD WITHOUT CONTRAST TECHNIQUE: Contiguous axial images were obtained from the base of the skull through the vertex without intravenous  contrast. RADIATION DOSE REDUCTION: This exam was performed according to the departmental dose-optimization program which includes automated exposure control, adjustment of the mA and/or kV according to patient size and/or use of iterative reconstruction technique. COMPARISON:  Brain MRI 04/12/2020.  Head CT 01/20/2017. FINDINGS: Brain: Cerebral volume stable and within normal limits for age. Choroid plexus cysts, normal variant. No midline shift, ventriculomegaly, mass effect, evidence of mass lesion, intracranial hemorrhage or evidence of cortically based acute infarction. Gray-white differentiation stable and within normal limits for age. Vascular: Advanced calcified atherosclerosis at the skull base. No suspicious intracranial vascular hyperdensity. Skull: Stable, intact. Sinuses/Orbits: Visualized paranasal sinuses and mastoids are clear. Other: No acute orbit or scalp soft tissue injury identified. Stable postoperative changes to the globes. IMPRESSION: 1. No acute intracranial abnormality or acute traumatic injury identified. 2. Stable and negative for age noncontrast CT appearance of the brain. Electronically Signed   By: Marlise Simpers M.D.   On: 09/30/2023 05:55      Labs: BNP (last 3 results) No results for input(s): "BNP" in the last 8760 hours. Basic Metabolic Panel: Recent Labs  Lab 10/08/23 0237 10/09/23 0356 10/10/23 0713 10/11/23 0533 10/12/23 0419 10/13/23 0528 10/14/23 0426  NA 135 134* 133* 136 134* 134* 133*  K 4.3 4.5 4.4 4.3 4.5 4.7 4.6  CL 107 106 108 108 104 106 103  CO2 20* 20* 18* 23 22 21* 23  GLUCOSE 93 85 85 85 92 81 86  BUN 52* 50* 45* 42* 42* 38* 37*   CREATININE 1.50* 1.37* 1.51* 1.46* 1.55* 1.50* 1.48*  CALCIUM  7.8* 7.9* 8.0* 8.0* 8.2* 7.9* 8.0*  MG 2.1 2.1  --   --   --   --   --   PHOS 3.5  --   --   --   --   --   --    Liver Function Tests: Recent Labs  Lab 10/07/23 1447 10/08/23 0237 10/10/23 0713  AST  --  57* 42*  ALT  --  105* 73*  ALKPHOS  --  189* 146*  BILITOT 0.9 0.8 0.8  PROT  --  4.8* 4.5*  ALBUMIN  --  1.6* <1.5*   No results for input(s): "LIPASE", "AMYLASE" in the last 168 hours. No results for input(s): "AMMONIA" in the last 168 hours. CBC: Recent Labs  Lab 10/10/23 0713 10/11/23 0533 10/12/23 0419 10/13/23 0528 10/14/23 0426  WBC 18.9* 17.5* 15.7* 13.0* 12.0*  HGB 7.9* 8.5* 9.2* 9.0* 9.6*  HCT 25.2* 25.4* 28.5* 27.8* 29.8*  MCV 99.6 99.2 101.1* 101.5* 101.0*  PLT 537* 509* 523* 444* 508*   Cardiac Enzymes: No results for input(s): "CKTOTAL", "CKMB", "CKMBINDEX", "TROPONINI" in the last 168 hours. BNP: Invalid input(s): "POCBNP" CBG: No results for input(s): "GLUCAP" in the last 168 hours. D-Dimer No results for input(s): "DDIMER" in the last 72 hours. Hgb A1c No results for input(s): "HGBA1C" in the last 72 hours. Lipid Profile No results for input(s): "CHOL", "HDL", "LDLCALC", "TRIG", "CHOLHDL", "LDLDIRECT" in the last 72 hours. Thyroid  function studies No results for input(s): "TSH", "T4TOTAL", "T3FREE", "THYROIDAB" in the last 72 hours.  Invalid input(s): "FREET3" Anemia work up No results for input(s): "VITAMINB12", "FOLATE", "FERRITIN", "TIBC", "IRON", "RETICCTPCT" in the last 72 hours. Urinalysis    Component Value Date/Time   COLORURINE YELLOW (A) 09/30/2023 0557   APPEARANCEUR CLOUDY (A) 09/30/2023 0557   APPEARANCEUR Hazy (A) 08/12/2023 1141   LABSPEC 1.015 09/30/2023 0557   PHURINE 5.0 09/30/2023 0557   GLUCOSEU  NEGATIVE 09/30/2023 0557   HGBUR SMALL (A) 09/30/2023 0557   BILIRUBINUR NEGATIVE 09/30/2023 0557   BILIRUBINUR Negative 08/12/2023 1141   KETONESUR NEGATIVE  09/30/2023 0557   PROTEINUR 30 (A) 09/30/2023 0557   UROBILINOGEN 1.0 06/30/2019 0823   NITRITE NEGATIVE 09/30/2023 0557   LEUKOCYTESUR LARGE (A) 09/30/2023 0557   Sepsis Labs Recent Labs  Lab 10/11/23 0533 10/12/23 0419 10/13/23 0528 10/14/23 0426  WBC 17.5* 15.7* 13.0* 12.0*   Microbiology Recent Results (from the past 240 hours)  Expectorated Sputum Assessment w Gram Stain, Rflx to Resp Cult     Status: None   Collection Time: 10/06/23  8:39 PM   Specimen: Sputum  Result Value Ref Range Status   Specimen Description SPUTUM  Final   Special Requests NONE  Final   Sputum evaluation   Final    THIS SPECIMEN IS ACCEPTABLE FOR SPUTUM CULTURE Performed at [facility], [address], [place], [place] [address]    Report Status 10/06/2023 FINAL  Final  Culture, Respiratory w Gram Stain     Status: None   Collection Time: 10/06/23  8:39 PM   Specimen: SPU  Result Value Ref Range Status   Specimen Description   Final    SPUTUM Performed at [facility], [address], [place], [place] [address]    Special Requests   Final    NONE Reflexed from [id number] Performed at [facility], [address] [place], [place] [address]    Gram Stain   Final    RARE WBC PRESENT, PREDOMINANTLY PMN FEW GRAM POSITIVE COCCI RARE BUDDING YEAST SEEN Performed at [facility], [address], [place], [place] [address]    Culture   Final    ABUNDANT METHICILLIN RESISTANT STAPHYLOCOCCUS AUREUS   Report Status 10/09/2023 FINAL  Final   Organism ID, Bacteria METHICILLIN RESISTANT STAPHYLOCOCCUS AUREUS  Final      Susceptibility   Methicillin resistant staphylococcus aureus - MIC*    CIPROFLOXACIN  >=8 RESISTANT Resistant     ERYTHROMYCIN >=8 RESISTANT Resistant     GENTAMICIN <=0.5 SENSITIVE Sensitive     OXACILLIN >=4 RESISTANT Resistant     TETRACYCLINE <=1 SENSITIVE Sensitive     VANCOMYCIN  1 SENSITIVE Sensitive     TRIMETH /SULFA  <=10 SENSITIVE  Sensitive     CLINDAMYCIN <=0.25 SENSITIVE Sensitive     RIFAMPIN <=0.5 SENSITIVE Sensitive     Inducible Clindamycin NEGATIVE Sensitive     LINEZOLID  2 SENSITIVE Sensitive     * ABUNDANT METHICILLIN RESISTANT STAPHYLOCOCCUS AUREUS  Acid Fast Smear (AFB)     Status: None   Collection Time: 10/06/23  8:42 PM   Specimen: Sputum  Result Value Ref Range Status   AFB Specimen Processing Concentration  Final   Acid Fast Smear Negative  Final    Comment: (NOTE) Performed At: [facility] [address] [place], [place] [id number] Pearlean Botts MD [id number]    Source (AFB) SPUTUM  Final    Comment: Performed at [facility], [address] [place], [place] [address]  Culture, fungus without smear     Status: Abnormal (Preliminary result)   Collection Time: 10/06/23  8:43 PM   Specimen: SPU; Other  Result Value Ref Range Status   Specimen Description   Final    SPUTUM Performed at [facility], [address], [place], [place] [address]    Special Requests   Final    NONE Performed at [facility], [address] [place], [place] [address]    Culture (A)  Final    CANDIDA PARAPSILOSIS CONTINUING TO HOLD Performed at [facility], [address] [place], [place] [address]    Report Status PENDING  Incomplete  Body fluid culture w Gram Stain     Status: None   Collection Time: 10/08/23 11:58 AM   Specimen: PATH Cytology Pleural fluid  Result Value Ref Range Status   Specimen Description   Final    FLUID Performed at [facility], [address] [place], [place] [address]    Special Requests   Final    CYTO PLEU Performed at [facility], [address] [place], [place] [address]    Gram Stain   Final    FEW WBC PRESENT,BOTH PMN AND MONONUCLEAR NO ORGANISMS SEEN    Culture   Final    NO GROWTH 3 DAYS Performed at [facility], [address] [place], [place] [address]    Report Status  10/12/2023 FINAL  Final  Acid Fast Smear (AFB)     Status: None   Collection Time: 10/09/23  4:00 PM   Specimen: Sputum  Result Value Ref Range Status   AFB Specimen Processing Concentration  Final   Acid Fast Smear Negative  Final    Comment: (NOTE) Performed At: [facility] [address] [place], [place] [id number] Pearlean Botts MD [id number]    Source (AFB) SPUTUM  Final    Comment: Performed at [facility], [address] [place], [place] [address]     Total time spend on discharging this patient, including the last patient exam, discussing the hospital stay, instructions for ongoing care as it relates to all pertinent caregivers, as well as preparing the medical discharge records, prescriptions, and/or referrals as applicable, is 35 minutes.    Garrison Kanner, MD  [facility] 10/14/2023, 10:03 AM

## 2023-10-14 NOTE — TOC Progression Note (Signed)
 Transition of Care [facility]) - Progression Note    Patient Details  Name: Zachary [name] MRN: [id number] Date of Birth: January 10, 1941  Transition of [facility]) CM/SW Contact  Alexandra Ice, RN Phone Number: 10/14/2023, 8:31 AM  Clinical Narrative:     Received approval for [facility], Approved PlanAuthID:[id number] Dates:5/12-5/14/2025 Next Review Date: 10/15/2023. Sent message to [place] at [place], to see if they can accept today. Notified MD and bedside nurse of approval.   Expected Discharge Plan: Skilled Nursing Facility Barriers to Discharge: Patient left Against Medical Advice [facility])  Expected Discharge Plan and Services     Post Acute Care Choice: Skilled Nursing Facility Living arrangements for the past 2 months: Single Family Home                           HH Arranged: RN, PT, OT, Nurse's Aide HH Agency: [facility] Date [facility] Agency Contacted: 10/02/23   Representative spoke with at [facility] Agency: Verdis Glade   Social Determinants of Health (SDOH) Interventions SDOH Screenings   Food Insecurity: No Food Insecurity (09/30/2023)  Housing: Low Risk  (09/30/2023)  Transportation Needs: No Transportation Needs (09/30/2023)  Utilities: Not At Risk (09/30/2023)  Alcohol  Screen: Low Risk  (09/25/2021)  Depression (PHQ2-9): Low Risk  (09/26/2023)  Financial Resource Strain: Low Risk  (09/25/2021)  Physical Activity: Insufficiently Active (09/25/2021)  Social Connections: Moderately Isolated (09/30/2023)  Stress: No Stress Concern Present (11/24/2019)  Tobacco Use: Low Risk  (09/30/2023)    Readmission Risk Interventions    10/01/2023    2:47 PM  Readmission Risk Prevention Plan  PCP or Specialist Appt within 3-5 Days Complete  Social Work Consult for Recovery Care Planning/Counseling Complete  Palliative Care Screening Not Applicable

## 2023-10-14 NOTE — Discharge Instructions (Signed)
 While on the antibiotic linezolid avoid or minimize following foods and drinks as they may interact with linezolid.  These foods and drinks contain tyramine.  Tyramine is a natural product found in some plants and animals.  Too much tyramine in combination with linezolid can cause high levels of serotonin in the body.  Serotonin is a chemical in our body that controls mood, sleep, digestion, and other functions.  Several signs of too much serotonin in the body (also called serotonin syndrome) are fast heart rate, sweating, fevers, high blood pressure, muscle twitching, and confusion.  It is important to seek medical attention if you have these symptoms.   Avoid foods and beverages that are high in tyramine while taking linezolid, including: Alcoholic beverages (such as beer, red wine, vermouth, sherry) Aged cheeses (such as cheddar, blue cheese, swiss, feta, parmesan, camembert) Fermented or pickled foods (such as sauerkraut, pickled beets, pickled peppers, pickled cucumbers/pickles, kim chee/kimchi)  Dried/aged, smoked or processed meats and sausages (such as salami, pepperoni, liverwurst, hot dogs, bologna, bacon) Soybean products (such as soy sauce, tofu) Preserved fish (such as pickled herring) Products that contain large amounts of yeast (such as bouillon cubes, powdered soup/gravy, homemade or sourdough bread)  Broad/fava beans  Following foods are OK to eat while taking linezolid: Pasteurized cheeses (such as [place], ricotta, cottage cheese, cream cheese) Vegetables (not fermented or pickled) Non-cured or smoked meats

## 2023-10-14 NOTE — Plan of Care (Signed)
  Problem: Clinical Measurements: Goal: Diagnostic test results will improve Outcome: Progressing   Problem: Respiratory: Goal: Ability to maintain adequate ventilation will improve Outcome: Progressing   Problem: Education: Goal: Knowledge of General Education information will improve Description: Including pain rating scale, medication(s)/side effects and non-pharmacologic comfort measures Outcome: Progressing   Problem: Clinical Measurements: Goal: Diagnostic test results will improve Outcome: Progressing   Problem: Activity: Goal: Risk for activity intolerance will decrease Outcome: Progressing   Problem: Safety: Goal: Ability to remain free from injury will improve Outcome: Progressing

## 2023-10-14 NOTE — Progress Notes (Signed)
 Late entry- pt seen on 10/13/23   Date of Admission:  09/30/2023      ID: Zachary [name] is a 83 y.o. male  Principal Problem:   Sepsis (HCC) Active Problems:   Mixed hyperlipidemia   Essential hypertension   Acute kidney injury superimposed on chronic kidney disease (HCC)   Pneumonia   UTI (urinary tract infection)   Acute encephalopathy   CKD (chronic kidney disease) stage 4, GFR 15-29 ml/min (HCC)   NSTEMI (non-ST elevated myocardial infarction) (HCC)   Protein-calorie malnutrition, severe   MRSA bacteremia   Cavitary pneumonia    Subjective: Doing okay now Over the weekend he was depressed and wanted to not eat and refused meds  Medications:   apixaban  10 mg Oral BID   Followed by   Zachary [name] ON 10/17/2023] apixaban  5 mg Oral BID   Chlorhexidine  Gluconate Cloth  6 each Topical Q0600   feeding supplement  237 mL Oral TID BM   linezolid   600 mg Oral Q12H   midodrine   10 mg Oral TID WC   multivitamin with minerals  1 tablet Oral Daily   polyethylene glycol  17 g Oral BID   sodium bicarbonate   650 mg Oral BID    Objective: Vital signs in last 24 hours: Patient Vitals for the past 24 hrs:  BP Temp Temp src Pulse Resp SpO2  10/14/23 0809 101/70 (!) 96.8 F (36 C) -- 60 16 98 %  10/14/23 0429 112/73 (!) 97.4 F (36.3 C) -- 63 19 99 %  10/13/23 1952 102/64 (!) 97.4 F (36.3 C) Oral (!) 52 18 100 %  10/13/23 1534 109/73 (!) 97.5 F (36.4 C) -- 73 16 100 %      PHYSICAL EXAM:  General: Alert, cooperative, no distress, emaciated Lungs: b/l air entry- crepts rt side Heart: Regular rate and rhythm, no murmur, rub or gallop. Abdomen: Soft, suprapubic catheter present Extremities: atraumatic, no cyanosis. No edema. No clubbing Skin: No rashes or lesions. Or bruising Lymph: Cervical, supraclavicular normal. Neurologic: Grossly non-focal  Lab Results    Latest Ref Rng & Units 10/14/2023    4:26 AM 10/13/2023    5:28 AM 10/12/2023    4:19 AM  CBC  WBC 4.0 -  10.5 K/uL 12.0  13.0  15.7   Hemoglobin 13.0 - 17.0 g/dL 9.6  9.0  9.2   Hematocrit 39.0 - 52.0 % 29.8  27.8  28.5   Platelets 150 - 400 K/uL 508  444  523        Latest Ref Rng & Units 10/14/2023    4:26 AM 10/13/2023    5:28 AM 10/12/2023    4:19 AM  CMP  Glucose 70 - 99 mg/dL 86  81  92   BUN 8 - 23 mg/dL 37  38  42   Creatinine 0.61 - 1.24 mg/dL 1.61  0.96  0.45   Sodium 135 - 145 mmol/L 133  134  134   Potassium 3.5 - 5.1 mmol/L 4.6  4.7  4.5   Chloride 98 - 111 mmol/L 103  106  104   CO2 22 - 32 mmol/L 23  21  22    Calcium  8.9 - 10.3 mg/dL 8.0  7.9  8.2       Microbiology: 09/30/2023. 1 out of 2 sets blood culture MRSA 5/2 BC 0- NG 09/30/2023 urine culture ESBL E. 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 Sputum positive for MRSA Continue linezolid  total of 4 weeks until 10/30/23 Will need weekly labs to look for pancytopenia And to check cr/lft Will follow him as OP on 10/23/23  Seen by pulmonary tuberculosis  ruled out   Leucocytosis almost normal  Abnormal LFTS.  Increasing transaminases and alkaline phosphatase  Anemia  Weight loss of 40 pounds  History of urinary retention has suprapubic catheter ESBL E. coli and Enterococcus in urine culture.  Treated with meropenem  History of obstructive uropathy Bladder diverticulum  AKI on CKD Fluctuating cr    Discussed the management with the patient and the care team   ID will sign off- call if needed

## 2023-10-14 NOTE — TOC Transition Note (Signed)
 Transition of Care [facility]) - Discharge Note   Patient Details  Name: Zachary [name] MRN: [id number] Date of Birth: 1940-12-26  Transition of Care [facility]) CM/SW Contact:  Alexandra Ice, RN Phone Number: 10/14/2023, 10:56 AM   Clinical Narrative:    Patient received auth for [facility]. Facility is able to accept today. Discharge summary and orders received and sent to facility via HUB. Spoke with Blaise Bumps at [facility], patient is #3 on scheduled for pick up. Provided MD and bedside with room number, E-7, number for report [phone], and ETA for [facility]. EMS packet printed to nurse station. TOC contacted Dell, spouse and notified that patient is discharging to [facility] and being transported by EMS; provided her patient's room number.    Final next level of care: Skilled Nursing Facility Barriers to Discharge: Barriers Resolved   Patient Goals and CMS Choice Patient states their goals for this hospitalization and ongoing recovery are:: get better CMS Medicare.gov Compare Post Acute Care list provided to:: Patient Choice offered to / list presented to : Spouse      Discharge Placement                Patient to be transferred to facility by: [facility] Name of family member notified: Morrell Aran Patient and family notified of of transfer: 10/14/23  Discharge Plan and Services Additional resources added to the After Visit Summary for       Post Acute Care Choice: Skilled Nursing Facility            DME Agency: NA       HH Arranged: NA HH Agency: [facility] Date [facility] Agency Contacted: 10/02/23   Representative spoke with at [facility] Agency: Verdis Glade  Social Drivers of Health (SDOH) Interventions SDOH Screenings   Food Insecurity: No Food Insecurity (09/30/2023)  Housing: Low Risk  (09/30/2023)  Transportation Needs: No Transportation Needs (09/30/2023)  Utilities: Not At Risk (09/30/2023)  Alcohol  Screen: Low Risk  (09/25/2021)  Depression (PHQ2-9): Low Risk   (09/26/2023)  Financial Resource Strain: Low Risk  (09/25/2021)  Physical Activity: Insufficiently Active (09/25/2021)  Social Connections: Moderately Isolated (09/30/2023)  Stress: No Stress Concern Present (11/24/2019)  Tobacco Use: Low Risk  (09/30/2023)     Readmission Risk Interventions    10/01/2023    2:47 PM  Readmission Risk Prevention Plan  PCP or Specialist Appt within 3-5 Days Complete  Social Work Consult for Recovery Care Planning/Counseling Complete  Palliative Care Screening Not Applicable

## 2023-10-15 ENCOUNTER — Ambulatory Visit: Payer: Self-pay | Admitting: Oncology

## 2023-10-15 LAB — BASIC METABOLIC PANEL (CC13): EGFR: 44

## 2023-10-16 ENCOUNTER — Other Ambulatory Visit: Payer: Self-pay | Admitting: Family Medicine

## 2023-10-16 DIAGNOSIS — R911 Solitary pulmonary nodule: Secondary | ICD-10-CM

## 2023-10-16 DIAGNOSIS — J188 Other pneumonia, unspecified organism: Secondary | ICD-10-CM

## 2023-10-16 NOTE — Telephone Encounter (Signed)
-----   Message from Zachary [name] sent at 10/15/2023 10:52 PM EDT ----- Please arrange him to do a follow up MD visit in a few weeks. Thanks.  Please follow up on his T4 testing.

## 2023-10-16 NOTE — Telephone Encounter (Signed)
 Zachary [name] can you please arrange him a follow up MD visit in a few weeks and notify him of appointment dates and times.

## 2023-10-17 ENCOUNTER — Telehealth: Payer: Self-pay

## 2023-10-17 DIAGNOSIS — S61411A Laceration without foreign body of right hand, initial encounter: Secondary | ICD-10-CM | POA: Diagnosis not present

## 2023-10-17 DIAGNOSIS — E43 Unspecified severe protein-calorie malnutrition: Secondary | ICD-10-CM | POA: Diagnosis not present

## 2023-10-17 DIAGNOSIS — J189 Pneumonia, unspecified organism: Secondary | ICD-10-CM | POA: Diagnosis not present

## 2023-10-17 DIAGNOSIS — I950 Idiopathic hypotension: Secondary | ICD-10-CM | POA: Diagnosis not present

## 2023-10-17 DIAGNOSIS — R5383 Other fatigue: Secondary | ICD-10-CM | POA: Diagnosis not present

## 2023-10-17 NOTE — Telephone Encounter (Signed)
 LMTCB. E2C2 please advise when patient calls back.

## 2023-10-17 NOTE — Telephone Encounter (Signed)
-----   Message from Annitta Kindler sent at 10/10/2023  6:22 PM EDT ----- Merritt Ables can we please schedule this patient for a follow up visit with me in 4 to 6 weeks.   Thanks JP

## 2023-10-20 ENCOUNTER — Ambulatory Visit: Admitting: Urology

## 2023-10-20 LAB — CULTURE, FUNGUS WITHOUT SMEAR

## 2023-10-20 NOTE — Telephone Encounter (Signed)
 Lmtcb

## 2023-10-21 NOTE — Telephone Encounter (Signed)
 Appt scheduled NFN

## 2023-10-23 ENCOUNTER — Inpatient Hospital Stay: Admitting: Infectious Diseases

## 2023-10-23 ENCOUNTER — Ambulatory Visit: Attending: Infectious Diseases | Admitting: Infectious Diseases

## 2023-10-23 ENCOUNTER — Other Ambulatory Visit
Admission: RE | Admit: 2023-10-23 | Discharge: 2023-10-23 | Disposition: A | Discharge status: Home / Self Care | Source: Ambulatory Visit | Attending: Infectious Diseases | Admitting: Infectious Diseases

## 2023-10-23 VITALS — BP 90/61 | HR 85 | Temp 97.2°F

## 2023-10-23 DIAGNOSIS — R634 Abnormal weight loss: Secondary | ICD-10-CM | POA: Diagnosis not present

## 2023-10-23 DIAGNOSIS — D649 Anemia, unspecified: Secondary | ICD-10-CM | POA: Diagnosis not present

## 2023-10-23 DIAGNOSIS — J15212 Pneumonia due to Methicillin resistant Staphylococcus aureus: Secondary | ICD-10-CM | POA: Insufficient documentation

## 2023-10-23 DIAGNOSIS — R54 Age-related physical debility: Secondary | ICD-10-CM | POA: Insufficient documentation

## 2023-10-23 DIAGNOSIS — J850 Gangrene and necrosis of lung: Secondary | ICD-10-CM | POA: Insufficient documentation

## 2023-10-23 DIAGNOSIS — Z9351 Cutaneous-vesicostomy status: Secondary | ICD-10-CM | POA: Insufficient documentation

## 2023-10-23 DIAGNOSIS — R7881 Bacteremia: Secondary | ICD-10-CM | POA: Insufficient documentation

## 2023-10-23 LAB — COMPREHENSIVE METABOLIC PANEL WITH GFR
ALT: 41 U/L (ref 0–44)
AST: 42 U/L — ABNORMAL HIGH (ref 15–41)
Albumin: 2.4 g/dL — ABNORMAL LOW (ref 3.5–5.0)
Alkaline Phosphatase: 112 U/L (ref 38–126)
Anion gap: 14 (ref 5–15)
BUN: 36 mg/dL — ABNORMAL HIGH (ref 8–23)
CO2: 17 mmol/L — ABNORMAL LOW (ref 22–32)
Calcium: 8.7 mg/dL — ABNORMAL LOW (ref 8.9–10.3)
Chloride: 101 mmol/L (ref 98–111)
Creatinine, Ser: 2.13 mg/dL — ABNORMAL HIGH (ref 0.61–1.24)
GFR, Estimated: 31 mL/min — ABNORMAL LOW (ref >60)
Glucose, Bld: 143 mg/dL — ABNORMAL HIGH (ref 70–99)
Potassium: 4.8 mmol/L (ref 3.5–5.1)
Sodium: 132 mmol/L — ABNORMAL LOW (ref 135–145)
Total Bilirubin: 1.2 mg/dL (ref 0.0–1.2)
Total Protein: 5.6 g/dL — ABNORMAL LOW (ref 6.5–8.1)

## 2023-10-23 LAB — CBC WITH DIFFERENTIAL/PLATELET
Abs Immature Granulocytes: 0.36 10*3/uL — ABNORMAL HIGH (ref 0.00–0.07)
Basophils Absolute: 0 10*3/uL (ref 0.0–0.1)
Basophils Relative: 0 %
Eosinophils Absolute: 0 10*3/uL (ref 0.0–0.5)
Eosinophils Relative: 0 %
HCT: 25.2 % — ABNORMAL LOW (ref 39.0–52.0)
Hemoglobin: 8 g/dL — ABNORMAL LOW (ref 13.0–17.0)
Immature Granulocytes: 2 %
Lymphocytes Relative: 5 %
Lymphs Abs: 0.7 10*3/uL (ref 0.7–4.0)
MCH: 32.5 pg (ref 26.0–34.0)
MCHC: 31.7 g/dL (ref 30.0–36.0)
MCV: 102.4 fL — ABNORMAL HIGH (ref 80.0–100.0)
Monocytes Absolute: 0.2 10*3/uL (ref 0.1–1.0)
Monocytes Relative: 2 %
Neutro Abs: 13.7 10*3/uL — ABNORMAL HIGH (ref 1.7–7.7)
Neutrophils Relative %: 91 %
Platelets: 289 10*3/uL (ref 150–400)
RBC: 2.46 MIL/uL — ABNORMAL LOW (ref 4.22–5.81)
RDW: 15.4 % (ref 11.5–15.5)
WBC: 15 10*3/uL — ABNORMAL HIGH (ref 4.0–10.5)
nRBC: 0.2 % (ref 0.0–0.2)

## 2023-10-23 NOTE — Patient Instructions (Addendum)
 Pt here to see me for necrotizing pneumonia, and MRSA bacteremia- BP 90/61, HR 86 and Pulse 96% after we gave him some hot chocolate as we were not able to record his pulse ox before that, chest b/l air entry, hs irregular  on Linezolid  600mg  Po BID until 10/29/23. We are doing labs today Please check CBC with diff/CMP on Monday Please keep his legs elevated and address the edema legs

## 2023-10-23 NOTE — Progress Notes (Signed)
 NAME: Zachary [name]  DOB: 1940/08/11  MRN: [id number]  Date/Time: 10/23/2023 11:53 AM  Subjective:   ? Zachary [name] is a 83 y.o. male with a history of COPD,Hematuria, urinary retention with bladder distention with bilateral hydronephrosis in March 2025 necessitating suprapubic catheter placement on 08/26/2023 was recently in [facility] between 09/30/2023 until 10/14/2023 for MRSA bacteremia  on 09/30/2023 and found to have necrotizing pneumonia on the right lung.  He had 3 sputum culture negative for AFB The sputum for MRSA was positive. Repeat blood culture on 10/03/2023 was negative for MRSA TEE done on 10/07/2023 showed no endocarditis. He was discharged to skilled nursing facility on linezolid  600 mg p.o. twice daily for a total of 4 weeks which was until 10/29/2023 He is here for routine follow-up His main complaint is swelling in his legs He is feeling weak His cough is better No fever Significant weight loss in the past few months   Past Medical History:  Diagnosis Date   B12 deficiency anemia 08/03/2019   Hypercholesterolemia    Hypertension    Vertigo    last episode approx 09/2018    Past Surgical History:  Procedure Laterality Date   APPENDECTOMY     CATARACT EXTRACTION Bilateral    COLONOSCOPY WITH PROPOFOL  N/A 12/24/2018   Procedure: COLONOSCOPY WITH PROPOFOL ;  Surgeon: Marnee Sink, MD;  Location: [facility];  Service: Endoscopy;  Laterality: N/A;   COLONOSCOPY WITH PROPOFOL  N/A 12/13/2019   Procedure: COLONOSCOPY WITH BIOPSY ;  Surgeon: Marnee Sink, MD;  Location: [facility];  Service: Endoscopy;  Laterality: N/A;  priority 3   IR CYSTOSTOMY TUBE PLACEMENT/BLADDER ASPIRATION  08/26/2023   PLEURAL SCARIFICATION  05/2015   POLYPECTOMY N/A 12/13/2019   Procedure: POLYPECTOMY;  Surgeon: Marnee Sink, MD;  Location: [facility];  Service: Endoscopy;  Laterality: N/A;   TEE WITHOUT CARDIOVERSION N/A 10/07/2023   Procedure: ECHOCARDIOGRAM, TRANSESOPHAGEAL;   Surgeon: Alluri, Odessa Bene, MD;  Location: [facility];  Service: Cardiovascular;  Laterality: N/A;    Social History   Socioeconomic History   Marital status: Married    Spouse name: Not on file   Number of children: 0   Years of education: Not on file   Highest education level: 12th grade  Occupational History   Occupation: retired  Tobacco Use   Smoking status: Never   Smokeless tobacco: Never  Vaping Use   Vaping status: Never Used  Substance and Sexual Activity   Alcohol  use: No    Alcohol /week: 0.0 standard drinks of alcohol    Drug use: No   Sexual activity: Not on file  Other Topics Concern   Not on file  Social History Narrative   Lives with [name], wife.    Social Drivers of Corporate investment banker Strain: Low Risk  (09/25/2021)   Overall Financial Resource Strain (CARDIA)    Difficulty of Paying Living Expenses: Not hard at all  Food Insecurity: No Food Insecurity (09/30/2023)   Hunger Vital Sign    Worried About Running Out of Food in the Last Year: Never true    Ran Out of Food in the Last Year: Never true  Transportation Needs: No Transportation Needs (09/30/2023)   PRAPARE - Administrator, Civil Service (Medical): No    Lack of Transportation (Non-Medical): No  Physical Activity: Insufficiently Active (09/25/2021)   Exercise Vital Sign    Days of Exercise per Week: 2 days    Minutes of Exercise per Session: 60 min  Stress: No Stress Concern Present (11/24/2019)   [name]    Feeling of Stress : Not at all  Social Connections: Moderately Isolated (09/30/2023)   Social Connection and Isolation Panel [NHANES]    Frequency of Communication with Friends and Family: Twice a week    Frequency of Social Gatherings with Friends and Family: More than three times a week    Attends Religious Services: Never    Database administrator or Organizations: No    Attends Banker  Meetings: Never    Marital Status: Married  Catering manager Violence: Not At Risk (09/30/2023)   Humiliation, Afraid, Rape, and Kick questionnaire    Fear of Current or Ex-Partner: No    Emotionally Abused: No    Physically Abused: No    Sexually Abused: No    Family History  Problem Relation Age of Onset   Hypertension Mother    Hyperlipidemia Mother    Heart attack Father    Hypertension Father    CVA Father    ALS Brother    Prostate cancer Brother    No Known Allergies I? Current Outpatient Medications  Medication Sig Dispense Refill   acetaminophen  (TYLENOL ) 325 MG tablet Take 2 tablets (650 mg total) by mouth every 6 (six) hours as needed for mild pain (pain score 1-3), headache or fever (or Fever >/= 101).     apixaban  (ELIQUIS ) 5 MG TABS tablet Take 2 tablets (10 mg total) by mouth 2 (two) times daily for 3 days, THEN 1 tablet (5 mg total) 2 (two) times daily.     aspirin  EC 81 MG tablet Take 81 mg by mouth at bedtime. Pt states he has resumed as of 09/26/23     atorvastatin  (LIPITOR) 10 MG tablet Take 10 mg by mouth daily.     calcium  carbonate (OS-CAL - DOSED IN MG OF ELEMENTAL CALCIUM ) 1250 (500 Ca) MG tablet Take 1 tablet (1,250 mg total) by mouth 2 (two) times daily with a meal. 30 tablet 0   feeding supplement (ENSURE ENLIVE / ENSURE PLUS) LIQD Take 237 mLs by mouth 3 (three) times daily between meals.     iron  polysaccharides (NIFEREX) 150 MG capsule Take 1 capsule (150 mg total) by mouth daily. Can be any iron  supplement.     linezolid  (ZYVOX ) 600 MG tablet Take 1 tablet (600 mg total) by mouth every 12 (twelve) hours for 17 days.     midodrine  (PROAMATINE ) 10 MG tablet Take 1 tablet (10 mg total) by mouth 3 (three) times daily with meals.     Multiple Vitamin (MULTIVITAMIN WITH MINERALS) TABS tablet Take 1 tablet by mouth daily.     No current facility-administered medications for this visit.     Abtx:  Anti-infectives (From admission, onward)    None        REVIEW OF SYSTEMS:  Const: negative fever, negative chills, weight loss Eyes: negative diplopia or visual changes, negative eye pain ENT: negative coryza, negative sore throat Resp: negative cough, hemoptysis, dyspnea Cards: negative for chest pain, palpitations, lower extremity edema GU: negative for frequency, dysuria and hematuria GI: Negative for abdominal pain, diarrhea, bleeding, constipation Skin: negative for rash and pruritus Heme: negative for easy bruising and gum/nose bleeding MS: weakness Neurolo: Intentional tremors Psych:  anxiety, depression  Endocrine: negative for thyroid , diabetes Allergy/Immunology- negative for any medication or food allergies ? Objective:  VITALS:  BP 90/61   Pulse 85  Temp (!) 97.2 F (36.2 C)   SpO2 96%   Initially I was unable to record his pulse ox/BP  and then after a  drink of hot chocolate the above vitals were obtained PHYSICAL EXAM:  General: Alert, cooperative, no distress, very frail and weak in wheelchair bilateral head: Normocephalic, without obvious abnormality, atraumatic. Eyes: Conjunctivae clear, anicteric sclerae. Pupils are equal ENT Nares normal. No drainage or sinus tenderness. Lips, mucosa, and tongue normal. No Thrush Pale lips Neck:  symmetrical, no adenopathy, thyroid : non tender no carotid bruit and no JVD. Back: No CVA tenderness. Lungs:  bilateral air entry.  Crackles right side Heart: Irregular Abdomen: Did not examine as he is in a wheelchair Extremities: Edema legs Pitting 3+   skin: No rashes or lesions. Or bruising Lymph: Cervical, supraclavicular normal. Neurologic: Intentional tremors Pertinent Labs Lab Results CBC    Component Value Date/Time   WBC 12.0 (H) 10/14/2023 0426   RBC 2.95 (L) 10/14/2023 0426   HGB 9.6 (L) 10/14/2023 0426   HGB 13.6 02/05/2022 1500   HCT 29.8 (L) 10/14/2023 0426   HCT 38.4 02/05/2022 1500   PLT 508 (H) 10/14/2023 0426   PLT 271 02/05/2022 1500   MCV  101.0 (H) 10/14/2023 0426   MCV 94 02/05/2022 1500   MCH 32.5 10/14/2023 0426   MCHC 32.2 10/14/2023 0426   RDW 15.1 10/14/2023 0426   RDW 11.7 02/05/2022 1500   LYMPHSABS 0.4 (L) 10/04/2023 0432   LYMPHSABS 0.9 02/05/2022 1500   MONOABS 0.9 10/04/2023 0432   EOSABS 0.2 10/04/2023 0432   EOSABS 0.1 02/05/2022 1500   BASOSABS 0.0 10/04/2023 0432   BASOSABS 0.1 02/05/2022 1500       Latest Ref Rng & Units 10/14/2023    4:26 AM 10/13/2023    5:28 AM 10/12/2023    4:19 AM  CMP  Glucose 70 - 99 mg/dL 86  81  92   BUN 8 - 23 mg/dL 37  38  42   Creatinine 0.61 - 1.24 mg/dL 1.61  0.96  0.45   Sodium 135 - 145 mmol/L 133  134  134   Potassium 3.5 - 5.1 mmol/L 4.6  4.7  4.5   Chloride 98 - 111 mmol/L 103  106  104   CO2 22 - 32 mmol/L 23  21  22    Calcium  8.9 - 10.3 mg/dL 8.0  7.9  8.2     ? Impression/Recommendation MRSA bacteremia source was MRSA cavitary pneumonia After getting vancomycin  patient has been on linezolid  TEE no vegetations Repeat blood culture no growth He will complete a total of 4 weeks of linezolid  on 10/30/2023 He will get weekly labs to look for pancytopenia Today the labs were done in the hospital   Edema legs the nursing home will have to address this  Anemia  Frailty with significant weight loss  History of urinary retention Has suprapubic catheter  Discussed with patient I will share the results with his PCP and the skilled nursing facility  Labs done today    Latest Ref Rng & Units 10/23/2023    1:39 PM 10/14/2023    4:26 AM 10/13/2023    5:28 AM  CBC  WBC 4.0 - 10.5 K/uL 15.0  12.0  13.0   Hemoglobin 13.0 - 17.0 g/dL 8.0  9.6  9.0   Hematocrit 39.0 - 52.0 % 25.2  29.8  27.8   Platelets 150 - 400 K/uL 289  508  444  Latest Ref Rng & Units 10/23/2023    1:39 PM 10/14/2023    4:26 AM 10/13/2023    5:28 AM  CMP  Glucose 70 - 99 mg/dL 829  86  81   BUN 8 - 23 mg/dL 36  37  38   Creatinine 0.61 - 1.24 mg/dL 5.62  1.30  8.65   Sodium  135 - 145 mmol/L 132  133  134   Potassium 3.5 - 5.1 mmol/L 4.8  4.6  4.7   Chloride 98 - 111 mmol/L 101  103  106   CO2 22 - 32 mmol/L 17  23  21    Calcium  8.9 - 10.3 mg/dL 8.7  8.0  7.9   Total Protein 6.5 - 8.1 g/dL 5.6     Total Bilirubin 0.0 - 1.2 mg/dL 1.2     Alkaline Phos 38 - 126 U/L 112     AST 15 - 41 U/L 42     ALT 0 - 44 U/L 41         ________________________________________________  Note:  This document was prepared using Dragon voice recognition software and may include unintentional dictation errors.

## 2023-10-24 DIAGNOSIS — R040 Epistaxis: Secondary | ICD-10-CM | POA: Diagnosis not present

## 2023-10-24 DIAGNOSIS — N183 Chronic kidney disease, stage 3 unspecified: Secondary | ICD-10-CM | POA: Diagnosis not present

## 2023-10-24 DIAGNOSIS — D631 Anemia in chronic kidney disease: Secondary | ICD-10-CM | POA: Diagnosis not present

## 2023-10-24 DIAGNOSIS — J189 Pneumonia, unspecified organism: Secondary | ICD-10-CM | POA: Diagnosis not present

## 2023-10-24 DIAGNOSIS — R110 Nausea: Secondary | ICD-10-CM | POA: Diagnosis not present

## 2023-10-28 ENCOUNTER — Inpatient Hospital Stay: Admitting: Infectious Diseases

## 2023-10-29 ENCOUNTER — Ambulatory Visit
Admission: RE | Admit: 2023-10-29 | Discharge: 2023-10-29 | Disposition: A | Discharge status: Home / Self Care | Source: Ambulatory Visit | Attending: Family Medicine | Admitting: Family Medicine

## 2023-10-29 DIAGNOSIS — R911 Solitary pulmonary nodule: Secondary | ICD-10-CM

## 2023-10-29 DIAGNOSIS — J188 Other pneumonia, unspecified organism: Secondary | ICD-10-CM

## 2023-10-30 ENCOUNTER — Inpatient Hospital Stay
Admission: EM | Admit: 2023-10-30 | Discharge: 2023-11-05 | DRG: 811 | Disposition: A | Discharge status: Other Acute Inpatient Hospital | Source: Skilled Nursing Facility | Attending: Internal Medicine | Admitting: Internal Medicine

## 2023-10-30 ENCOUNTER — Other Ambulatory Visit: Payer: Self-pay

## 2023-10-30 DIAGNOSIS — I2694 Multiple subsegmental pulmonary emboli without acute cor pulmonale: Secondary | ICD-10-CM | POA: Diagnosis present

## 2023-10-30 DIAGNOSIS — I10 Essential (primary) hypertension: Secondary | ICD-10-CM | POA: Diagnosis not present

## 2023-10-30 DIAGNOSIS — I5022 Chronic systolic (congestive) heart failure: Secondary | ICD-10-CM | POA: Diagnosis present

## 2023-10-30 DIAGNOSIS — Z66 Do not resuscitate: Secondary | ICD-10-CM | POA: Diagnosis present

## 2023-10-30 DIAGNOSIS — I130 Hypertensive heart and chronic kidney disease with heart failure and stage 1 through stage 4 chronic kidney disease, or unspecified chronic kidney disease: Secondary | ICD-10-CM | POA: Diagnosis present

## 2023-10-30 DIAGNOSIS — Z79899 Other long term (current) drug therapy: Secondary | ICD-10-CM

## 2023-10-30 DIAGNOSIS — R531 Weakness: Secondary | ICD-10-CM | POA: Diagnosis not present

## 2023-10-30 DIAGNOSIS — D631 Anemia in chronic kidney disease: Secondary | ICD-10-CM | POA: Diagnosis present

## 2023-10-30 DIAGNOSIS — K5909 Other constipation: Secondary | ICD-10-CM | POA: Diagnosis present

## 2023-10-30 DIAGNOSIS — J189 Pneumonia, unspecified organism: Secondary | ICD-10-CM | POA: Diagnosis not present

## 2023-10-30 DIAGNOSIS — K5730 Diverticulosis of large intestine without perforation or abscess without bleeding: Secondary | ICD-10-CM | POA: Diagnosis present

## 2023-10-30 DIAGNOSIS — Z86711 Personal history of pulmonary embolism: Secondary | ICD-10-CM

## 2023-10-30 DIAGNOSIS — K449 Diaphragmatic hernia without obstruction or gangrene: Secondary | ICD-10-CM | POA: Diagnosis present

## 2023-10-30 DIAGNOSIS — J188 Other pneumonia, unspecified organism: Secondary | ICD-10-CM | POA: Diagnosis present

## 2023-10-30 DIAGNOSIS — Z7189 Other specified counseling: Secondary | ICD-10-CM

## 2023-10-30 DIAGNOSIS — Z83438 Family history of other disorder of lipoprotein metabolism and other lipidemia: Secondary | ICD-10-CM

## 2023-10-30 DIAGNOSIS — Z7901 Long term (current) use of anticoagulants: Secondary | ICD-10-CM

## 2023-10-30 DIAGNOSIS — E871 Hypo-osmolality and hyponatremia: Secondary | ICD-10-CM | POA: Diagnosis present

## 2023-10-30 DIAGNOSIS — I2699 Other pulmonary embolism without acute cor pulmonale: Secondary | ICD-10-CM | POA: Diagnosis present

## 2023-10-30 DIAGNOSIS — D509 Iron deficiency anemia, unspecified: Secondary | ICD-10-CM | POA: Diagnosis present

## 2023-10-30 DIAGNOSIS — E785 Hyperlipidemia, unspecified: Secondary | ICD-10-CM | POA: Diagnosis not present

## 2023-10-30 DIAGNOSIS — Z515 Encounter for palliative care: Secondary | ICD-10-CM | POA: Diagnosis not present

## 2023-10-30 DIAGNOSIS — R69 Illness, unspecified: Secondary | ICD-10-CM | POA: Diagnosis not present

## 2023-10-30 DIAGNOSIS — E43 Unspecified severe protein-calorie malnutrition: Secondary | ICD-10-CM | POA: Diagnosis present

## 2023-10-30 DIAGNOSIS — J984 Other disorders of lung: Secondary | ICD-10-CM | POA: Diagnosis not present

## 2023-10-30 DIAGNOSIS — T45515A Adverse effect of anticoagulants, initial encounter: Secondary | ICD-10-CM | POA: Diagnosis present

## 2023-10-30 DIAGNOSIS — D649 Anemia, unspecified: Principal | ICD-10-CM | POA: Diagnosis present

## 2023-10-30 DIAGNOSIS — I252 Old myocardial infarction: Secondary | ICD-10-CM

## 2023-10-30 DIAGNOSIS — I2510 Atherosclerotic heart disease of native coronary artery without angina pectoris: Secondary | ICD-10-CM | POA: Diagnosis not present

## 2023-10-30 DIAGNOSIS — K922 Gastrointestinal hemorrhage, unspecified: Secondary | ICD-10-CM | POA: Diagnosis present

## 2023-10-30 DIAGNOSIS — K2950 Unspecified chronic gastritis without bleeding: Secondary | ICD-10-CM | POA: Diagnosis not present

## 2023-10-30 DIAGNOSIS — K3189 Other diseases of stomach and duodenum: Secondary | ICD-10-CM | POA: Diagnosis present

## 2023-10-30 DIAGNOSIS — K319 Disease of stomach and duodenum, unspecified: Secondary | ICD-10-CM | POA: Diagnosis not present

## 2023-10-30 DIAGNOSIS — Z860100 Personal history of colon polyps, unspecified: Secondary | ICD-10-CM

## 2023-10-30 DIAGNOSIS — Z8249 Family history of ischemic heart disease and other diseases of the circulatory system: Secondary | ICD-10-CM

## 2023-10-30 DIAGNOSIS — K5939 Other megacolon: Secondary | ICD-10-CM | POA: Diagnosis present

## 2023-10-30 DIAGNOSIS — D500 Iron deficiency anemia secondary to blood loss (chronic): Secondary | ICD-10-CM | POA: Diagnosis present

## 2023-10-30 DIAGNOSIS — L8990 Pressure ulcer of unspecified site, unspecified stage: Secondary | ICD-10-CM | POA: Diagnosis not present

## 2023-10-30 DIAGNOSIS — K5790 Diverticulosis of intestine, part unspecified, without perforation or abscess without bleeding: Secondary | ICD-10-CM | POA: Diagnosis not present

## 2023-10-30 DIAGNOSIS — E039 Hypothyroidism, unspecified: Secondary | ICD-10-CM | POA: Diagnosis present

## 2023-10-30 DIAGNOSIS — D696 Thrombocytopenia, unspecified: Secondary | ICD-10-CM | POA: Diagnosis present

## 2023-10-30 DIAGNOSIS — Z8614 Personal history of Methicillin resistant Staphylococcus aureus infection: Secondary | ICD-10-CM

## 2023-10-30 DIAGNOSIS — Z681 Body mass index (BMI) 19 or less, adult: Secondary | ICD-10-CM

## 2023-10-30 DIAGNOSIS — I110 Hypertensive heart disease with heart failure: Secondary | ICD-10-CM | POA: Diagnosis not present

## 2023-10-30 DIAGNOSIS — R110 Nausea: Secondary | ICD-10-CM | POA: Diagnosis present

## 2023-10-30 DIAGNOSIS — B9681 Helicobacter pylori [H. pylori] as the cause of diseases classified elsewhere: Secondary | ICD-10-CM | POA: Diagnosis not present

## 2023-10-30 DIAGNOSIS — R54 Age-related physical debility: Secondary | ICD-10-CM | POA: Diagnosis present

## 2023-10-30 DIAGNOSIS — K222 Esophageal obstruction: Secondary | ICD-10-CM | POA: Diagnosis present

## 2023-10-30 DIAGNOSIS — K635 Polyp of colon: Secondary | ICD-10-CM | POA: Diagnosis present

## 2023-10-30 DIAGNOSIS — I2585 Chronic coronary microvascular dysfunction: Secondary | ICD-10-CM | POA: Diagnosis not present

## 2023-10-30 DIAGNOSIS — Z8042 Family history of malignant neoplasm of prostate: Secondary | ICD-10-CM

## 2023-10-30 DIAGNOSIS — N184 Chronic kidney disease, stage 4 (severe): Secondary | ICD-10-CM | POA: Diagnosis present

## 2023-10-30 DIAGNOSIS — Z823 Family history of stroke: Secondary | ICD-10-CM

## 2023-10-30 DIAGNOSIS — L89151 Pressure ulcer of sacral region, stage 1: Secondary | ICD-10-CM | POA: Diagnosis present

## 2023-10-30 DIAGNOSIS — E7800 Pure hypercholesterolemia, unspecified: Secondary | ICD-10-CM | POA: Diagnosis present

## 2023-10-30 LAB — IRON AND TIBC
Iron: 234 ug/dL — ABNORMAL HIGH (ref 45–182)
Saturation Ratios: 94 % — ABNORMAL HIGH (ref 17.9–39.5)
TIBC: 249 ug/dL — ABNORMAL LOW (ref 250–450)
UIBC: 15 ug/dL

## 2023-10-30 LAB — COMPREHENSIVE METABOLIC PANEL WITH GFR
ALT: 34 U/L (ref 0–44)
AST: 33 U/L (ref 15–41)
Albumin: 2.4 g/dL — ABNORMAL LOW (ref 3.5–5.0)
Alkaline Phosphatase: 89 U/L (ref 38–126)
Anion gap: 12 (ref 5–15)
BUN: 43 mg/dL — ABNORMAL HIGH (ref 8–23)
CO2: 17 mmol/L — ABNORMAL LOW (ref 22–32)
Calcium: 8.4 mg/dL — ABNORMAL LOW (ref 8.9–10.3)
Chloride: 103 mmol/L (ref 98–111)
Creatinine, Ser: 2.03 mg/dL — ABNORMAL HIGH (ref 0.61–1.24)
GFR, Estimated: 33 mL/min — ABNORMAL LOW (ref >60)
Glucose, Bld: 97 mg/dL (ref 70–99)
Potassium: 4.9 mmol/L (ref 3.5–5.1)
Sodium: 132 mmol/L — ABNORMAL LOW (ref 135–145)
Total Bilirubin: 0.5 mg/dL (ref 0.0–1.2)
Total Protein: 5.3 g/dL — ABNORMAL LOW (ref 6.5–8.1)

## 2023-10-30 LAB — CBC WITH DIFFERENTIAL/PLATELET
Abs Immature Granulocytes: 0.07 10*3/uL (ref 0.00–0.07)
Basophils Absolute: 0 10*3/uL (ref 0.0–0.1)
Basophils Relative: 0 %
Eosinophils Absolute: 0.1 10*3/uL (ref 0.0–0.5)
Eosinophils Relative: 1 %
HCT: 20.1 % — ABNORMAL LOW (ref 39.0–52.0)
Hemoglobin: 6.4 g/dL — ABNORMAL LOW (ref 13.0–17.0)
Immature Granulocytes: 1 %
Lymphocytes Relative: 10 %
Lymphs Abs: 0.7 10*3/uL (ref 0.7–4.0)
MCH: 32.7 pg (ref 26.0–34.0)
MCHC: 31.8 g/dL (ref 30.0–36.0)
MCV: 102.6 fL — ABNORMAL HIGH (ref 80.0–100.0)
Monocytes Absolute: 0.1 10*3/uL (ref 0.1–1.0)
Monocytes Relative: 1 %
Neutro Abs: 6.1 10*3/uL (ref 1.7–7.7)
Neutrophils Relative %: 87 %
Platelets: 119 10*3/uL — ABNORMAL LOW (ref 150–400)
RBC: 1.96 MIL/uL — ABNORMAL LOW (ref 4.22–5.81)
RDW: 15.4 % (ref 11.5–15.5)
WBC: 7.1 10*3/uL (ref 4.0–10.5)
nRBC: 0 % (ref 0.0–0.2)

## 2023-10-30 LAB — TECHNOLOGIST SMEAR REVIEW: Plt Morphology: NORMAL

## 2023-10-30 LAB — RETICULOCYTES
Immature Retic Fract: 2.7 % (ref 2.3–15.9)
RBC.: 2.24 MIL/uL — ABNORMAL LOW (ref 4.22–5.81)
Retic Count, Absolute: 10.4 10*3/uL — ABNORMAL LOW (ref 19.0–186.0)
Retic Ct Pct: 0.5 % (ref 0.4–3.1)

## 2023-10-30 LAB — CBC
HCT: 22 % — ABNORMAL LOW (ref 39.0–52.0)
Hemoglobin: 7.2 g/dL — ABNORMAL LOW (ref 13.0–17.0)
MCH: 31.4 pg (ref 26.0–34.0)
MCHC: 32.7 g/dL (ref 30.0–36.0)
MCV: 96.1 fL (ref 80.0–100.0)
Platelets: 108 10*3/uL — ABNORMAL LOW (ref 150–400)
RBC: 2.29 MIL/uL — ABNORMAL LOW (ref 4.22–5.81)
RDW: 15 % (ref 11.5–15.5)
WBC: 5.6 10*3/uL (ref 4.0–10.5)
nRBC: 0 % (ref 0.0–0.2)

## 2023-10-30 LAB — PREPARE RBC (CROSSMATCH)

## 2023-10-30 LAB — PROTIME-INR
INR: 1.2 (ref 0.8–1.2)
Prothrombin Time: 15.2 s (ref 11.4–15.2)

## 2023-10-30 LAB — BRAIN NATRIURETIC PEPTIDE: B Natriuretic Peptide: 377.7 pg/mL — ABNORMAL HIGH (ref 0.0–100.0)

## 2023-10-30 LAB — FERRITIN: Ferritin: 475 ng/mL — ABNORMAL HIGH (ref 24–336)

## 2023-10-30 LAB — APTT: aPTT: 32 s (ref 24–36)

## 2023-10-30 LAB — LACTATE DEHYDROGENASE: LDH: 169 U/L (ref 98–192)

## 2023-10-30 LAB — FOLATE: Folate: >40 ng/mL (ref >5.9)

## 2023-10-30 MED ORDER — ACETAMINOPHEN 325 MG PO TABS
650.0000 mg | ORAL_TABLET | Freq: Four times a day (QID) | ORAL | Status: DC | PRN
  Administered 2023-11-03: 650 mg via ORAL
  Filled 2023-10-30: qty 2

## 2023-10-30 MED ORDER — ONDANSETRON HCL 4 MG/2ML IJ SOLN
4.0000 mg | Freq: Three times a day (TID) | INTRAMUSCULAR | Status: DC | PRN
  Administered 2023-11-01 – 2023-11-03 (×2): 4 mg via INTRAVENOUS
  Filled 2023-10-30 (×2): qty 2

## 2023-10-30 MED ORDER — FUROSEMIDE 10 MG/ML IJ SOLN
20.0000 mg | Freq: Once | INTRAMUSCULAR | Status: AC
  Administered 2023-10-30: 20 mg via INTRAVENOUS
  Filled 2023-10-30: qty 4

## 2023-10-30 MED ORDER — ATORVASTATIN CALCIUM 10 MG PO TABS
10.0000 mg | ORAL_TABLET | Freq: Every day | ORAL | Status: DC
  Administered 2023-10-31 – 2023-11-05 (×6): 10 mg via ORAL
  Filled 2023-10-30 (×6): qty 1

## 2023-10-30 MED ORDER — HYDRALAZINE HCL 20 MG/ML IJ SOLN: 5.0000 mg | INTRAMUSCULAR | Status: DC | PRN

## 2023-10-30 MED ORDER — SODIUM CHLORIDE 0.9 % IV SOLN: 10.0000 mL/h | Freq: Once | INTRAVENOUS | Status: AC

## 2023-10-30 MED ORDER — ALBUTEROL SULFATE (2.5 MG/3ML) 0.083% IN NEBU: 3.0000 mL | INHALATION_SOLUTION | RESPIRATORY_TRACT | Status: DC | PRN

## 2023-10-30 MED ORDER — LINEZOLID 600 MG PO TABS
600.0000 mg | ORAL_TABLET | Freq: Two times a day (BID) | ORAL | Status: AC
  Administered 2023-10-30 – 2023-10-31 (×2): 600 mg via ORAL
  Filled 2023-10-30 (×2): qty 1

## 2023-10-30 MED ORDER — PANTOPRAZOLE SODIUM 40 MG IV SOLR
40.0000 mg | Freq: Once | INTRAVENOUS | Status: AC
  Administered 2023-10-30: 40 mg via INTRAVENOUS
  Filled 2023-10-30: qty 10

## 2023-10-30 MED ORDER — MIDODRINE HCL 5 MG PO TABS
10.0000 mg | ORAL_TABLET | Freq: Three times a day (TID) | ORAL | Status: DC
  Administered 2023-10-31 – 2023-11-05 (×15): 10 mg via ORAL
  Filled 2023-10-30 (×15): qty 2

## 2023-10-30 MED ORDER — PANTOPRAZOLE SODIUM 40 MG IV SOLR
40.0000 mg | Freq: Two times a day (BID) | INTRAVENOUS | Status: DC
  Administered 2023-10-31 – 2023-11-05 (×11): 40 mg via INTRAVENOUS
  Filled 2023-10-30 (×11): qty 10

## 2023-10-30 MED ORDER — ADULT MULTIVITAMIN W/MINERALS CH
1.0000 | ORAL_TABLET | Freq: Every day | ORAL | Status: DC
  Administered 2023-10-31 – 2023-11-05 (×6): 1 via ORAL
  Filled 2023-10-30 (×6): qty 1

## 2023-10-30 MED ORDER — DM-GUAIFENESIN ER 30-600 MG PO TB12: 1.0000 | ORAL_TABLET | Freq: Two times a day (BID) | ORAL | Status: DC | PRN

## 2023-10-30 MED ORDER — POLYSACCHARIDE IRON COMPLEX 150 MG PO CAPS
150.0000 mg | ORAL_CAPSULE | Freq: Every day | ORAL | Status: DC
  Administered 2023-10-31 – 2023-11-05 (×6): 150 mg via ORAL
  Filled 2023-10-30 (×6): qty 1

## 2023-10-30 NOTE — ED Provider Notes (Signed)
 [facility] Provider Note    Event Date/Time   First MD Initiated Contact with Patient 10/30/23 1751     (approximate)   History   Weakness  Patient from [facility] for hgb 6.8; patient complaining of weakness and shortness of breath. Patient on Eliquis , denies blood in foley or in stool.    HPI Zachary [name] is a 83 y.o. male PMH hypertension, hyperlipidemia, CAD, prior NSTEMI, thoracic aortic aneurysm, hospitalization in April 2025 for sepsis secondary to pneumonia (discharge summary reviewed), ESBL UTIs, prior pulmonary embolism on Eliquis  presents for evaluation of anemia in the setting of generalized weakness and shortness of breath - Patient states he has had generalized weakness for the past month, somewhat worsening recently.  Confirms labs at his nursing facility showed he had anemia to 6.4 so was sent here. - Has not noticed any black or bloody stool - No recent cough, fever, chills, abdominal pain, nausea/vomiting/diarrhea, urinary symptoms - No hematemesis, hemoptysis, hematuria  Per review of outpatient records, appears he is on Eliquis  twice daily at his facility.  DNR/DNI with limited interventions.      Physical Exam   Triage Vital Signs: ED Triage Vitals  Encounter Vitals Group     BP 10/30/23 1539 105/80     Systolic BP Percentile --      Diastolic BP Percentile --      Pulse Rate 10/30/23 1536 85     Resp 10/30/23 1536 18     Temp 10/30/23 1536 (!) 97.5 F (36.4 C)     Temp Source 10/30/23 1536 Oral     SpO2 10/30/23 1536 100 %     Weight --      Height --      Head Circumference --      Peak Flow --      Pain Score 10/30/23 1534 0     Pain Loc --      Pain Education --      Exclude from Growth Chart --     Most recent vital signs: Vitals:   10/30/23 1536 10/30/23 1539  BP:  105/80  Pulse: 85   Resp: 18   Temp: (!) 97.5 F (36.4 C)   SpO2: 100%      General: Awake, no distress.  CV:  Good  peripheral perfusion. RRR, RP 2+ Resp:  Normal effort. CTAB Abd:  No distention. Nontender to deep palpation throughout Rectal:  Light brown stool in rectal vault, trace red blood, no melena.  Diffusely Hemoccult positive.   ED Results / Procedures / Treatments   Labs (all labs ordered are listed, but only abnormal results are displayed) Labs Reviewed  CBC WITH DIFFERENTIAL/PLATELET - Abnormal; Notable for the following components:      Result Value   RBC 1.96 (*)    Hemoglobin 6.4 (*)    HCT 20.1 (*)    MCV 102.6 (*)    Platelets 119 (*)    All other components within normal limits  COMPREHENSIVE METABOLIC PANEL WITH GFR - Abnormal; Notable for the following components:   Sodium 132 (*)    CO2 17 (*)    BUN 43 (*)    Creatinine, Ser 2.03 (*)    Calcium  8.4 (*)    Total Protein 5.3 (*)    Albumin 2.4 (*)    GFR, Estimated 33 (*)    All other components within normal limits  TYPE AND SCREEN  PREPARE RBC (CROSSMATCH)  EKG  Ecg = sinus versus accelerated junctional rhythm, rate 88, no gross ST elevation or depression.  Repeat EKGs limited by artifact.  No gross evidence of ischemia on my read.   RADIOLOGY N/a   PROCEDURES:  Critical Care performed: Yes, see critical care procedure note(s)  .Critical Care  Performed by: Collis Deaner, MD Authorized by: Collis Deaner, MD   Critical care provider statement:    Critical care time (minutes):  30   Critical care time was exclusive of:  Separately billable procedures and treating other patients and teaching time   Critical care was necessary to treat or prevent imminent or life-threatening deterioration of the following conditions: Anemia requiring IV blood transfusion.   Critical care was time spent personally by me on the following activities:  Development of treatment plan with patient or surrogate, discussions with consultants, evaluation of patient's response to treatment, examination of patient, ordering and  review of laboratory studies, ordering and review of radiographic studies, ordering and performing treatments and interventions, pulse oximetry, re-evaluation of patient's condition and review of old charts   I assumed direction of critical care for this patient from another provider in my specialty: no     Care discussed with: admitting provider      MEDICATIONS ORDERED IN ED: Medications  0.9 %  sodium chloride  infusion (0 mL/hr Intravenous Hold 10/30/23 1829)  pantoprazole (PROTONIX) injection 40 mg (has no administration in time range)     IMPRESSION / MDM / ASSESSMENT AND PLAN / ED COURSE  I reviewed the triage vital signs and the nursing notes.                              DDX/MDM/AP: Differential diagnosis includes, but is not limited to, high concern for GI bleed, no other obvious source of bleeding.  No tenderness to deep palpation throughout abdomen, do not suspect perforated viscus.  Overall suspect lower GI bleed as opposed to brisk upper GI bleed based on exam and findings.  Fortunately hemodynamically stable here.  Plan: - N.p.o. - Labs at triage to confirm acute on chronic anemia - Will discuss with GI, anticipate admission - Protonix  Patient's presentation is most consistent with acute presentation with potential threat to life or bodily function.  The patient is on the cardiac monitor to evaluate for evidence of arrhythmia and/or significant heart rate changes.  ED course below.  Consented for blood transfusion, transfusing 2 units.  Will hold Eliquis .  Discussed with GI, will eval in morning and determine timing of colonoscopy at that time.  Okay for clear liquid diet in the meantime.  Admitted to hospitalist service.  Clinical Course as of 10/30/23 1836  Thu Oct 30, 2023  1809 CBC with anemia to 6.4, macrocytic.  Hemoglobin range 8-9.6 within the past month.  Mild thrombocytopenia.  No leukocytosis. [MM]  1810 CMP with mild stable hyponatremia, stable CKD with  mild bump in creatinine [MM]  1823 On chart review, last colonoscopy 12/2019: Findings: The perianal and digital rectal examinations were normal.  Three sessile polyps were found in the transverse colon. The polyps were 3 to 4 mm in size. These polyps were removed with a cold snare. Resection and retrieval were complete.  A 5 mm polyp was found in the cecum. The polyp was sessile. The polyp was removed with a cold snare. Resection and retrieval were complete.  Multiple small- mouthed diverticula were found in the  sigmoid colon. [MM]  1827 D/w Dr. Emerick Hanlon of GI - Can continue with clear liquid diet -Agrees no indication for anticoagulation reversal at this time - Dr. Ole Berkeley of GI will eval in AM and determining timing of colonoscopy at that time, hold Eliquis  in the meantime [MM]  1829 Hospitalist consult order placed [MM]    Clinical Course User Index [MM] Collis Deaner, MD     FINAL CLINICAL IMPRESSION(S) / ED DIAGNOSES   Final diagnoses:  Acute on chronic anemia  Gastrointestinal hemorrhage, unspecified gastrointestinal hemorrhage type     Rx / DC Orders   ED Discharge Orders     None        Note:  This document was prepared using Dragon voice recognition software and may include unintentional dictation errors.   Collis Deaner, MD 10/30/23 [phone]

## 2023-10-30 NOTE — H&P (Incomplete)
 History and Physical    Zachary [name] UJW:[id number] DOB: 27-Aug-1940 DOA: 10/30/2023  Referring MD/NP/PA:   PCP: [name], MD   Patient coming from:  The patient is coming from SNF   Chief Complaint: Weakness, SOB  HPI: Zachary [name] is a 83 y.o. male with medical history significant of hypotension on midodrine , hyperlipidemia, CAD, systolic CHF, CKD-4, anemia, PE on Eliquis , ESBL UTI, s/p L4 suprapubic catheter placement, MRSA bacteremia due to cavitary pneumonia on linezolid , who presents with weakness and shortness of breath.  Patient was recently hospitalized from 4/29 - 5/13 due to MRSA bacteria secondary to cavitary pneumonia.  Patient was discharged on Linezolid  from 5/13 to 5/30. Pt is following up with Dr. Francee Inch of ID.  Patient states that he has been feeling weak recently, which has been progressively worsening.  He also reports SOB, cough with little dark mucus production, no chest pain.  No fever or chills.  He denies dark stool or rectal bleeding.  No nausea, vomiting, diarrhea or abdominal pain.  No symptoms of UTI.  Patient had lab done in the facility which showed hemoglobin 6.8.  His hemoglobin is 6.4 in ED.  Data reviewed independently and ED Course: pt was found to have hemoglobin 6.4 (8.0 10/23/2023), stable renal function, temperature 97.5, blood pressure 105/80, heart rate 85, RR 18, oxygen  saturation 100% on room air.  Patient is placed in telemetry bed for observation.  Dr. Emerick Hanlon for GI is consulted.   EKG: I have personally reviewed.  QTc 445, anteroseptal infarction pattern, sinus rhythm,   Review of Systems:   General: no fevers, chills, no body weight gain, has fatigue HEENT: no blurry vision, hearing changes or sore throat Respiratory: has dyspnea, coughing, no wheezing CV: no chest pain, no palpitations GI: no nausea, vomiting, abdominal pain, diarrhea, constipation GU: no dysuria, burning on urination, increased urinary  frequency, hematuria.  Has suprapubic catheter in place Ext: has mild leg edema Neuro: no unilateral weakness, numbness, or tingling, no vision change or hearing loss Skin: no rash, no skin tear.   MSK: No muscle spasm, no deformity, no limitation of range of movement in spin Heme: No easy bruising.  Travel history: No recent long distant travel.   Allergy: No Known Allergies  Past Medical History:  Diagnosis Date   B12 deficiency anemia 08/03/2019   Hypercholesterolemia    Hypertension    Vertigo    last episode approx 09/2018    Past Surgical History:  Procedure Laterality Date   APPENDECTOMY     CATARACT EXTRACTION Bilateral    COLONOSCOPY WITH PROPOFOL  N/A 12/24/2018   Procedure: COLONOSCOPY WITH PROPOFOL ;  Surgeon: Marnee Sink, MD;  Location: [facility] END[facility];  Service: Endoscopy;  Laterality: N/A;   COLONOSCOPY WITH PROPOFOL  N/A 12/13/2019   Procedure: COLONOSCOPY WITH BIOPSY ;  Surgeon: Marnee Sink, MD;  Location: [facility];  Service: Endoscopy;  Laterality: N/A;  priority 3   IR CYSTOSTOMY TUBE PLACEMENT/BLADDER ASPIRATION  08/26/2023   PLEURAL SCARIFICATION  05/2015   POLYPECTOMY N/A 12/13/2019   Procedure: POLYPECTOMY;  Surgeon: Marnee Sink, MD;  Location: [facility];  Service: Endoscopy;  Laterality: N/A;   TEE WITHOUT CARDIOVERSION N/A 10/07/2023   Procedure: ECHOCARDIOGRAM, TRANSESOPHAGEAL;  Surgeon: Alluri, Odessa Bene, MD;  Location: [facility] ORS;  Service: Cardiovascular;  Laterality: N/A;    Social History:  reports that he has never smoked. He has never used smokeless tobacco. He reports that he does not drink alcohol  and does not use  drugs.  Family History:  Family History  Problem Relation Age of Onset   Hypertension Mother    Hyperlipidemia Mother    Heart attack Father    Hypertension Father    CVA Father    ALS Brother    Prostate cancer Brother      Prior to Admission medications   Medication Sig Start Date End Date Taking? Authorizing  Provider  acetaminophen  (TYLENOL ) 325 MG tablet Take 2 tablets (650 mg total) by mouth every 6 (six) hours as needed for mild pain (pain score 1-3), headache or fever (or Fever >/= 101). 08/08/23   Alexander, Natalie, DO  apixaban  (ELIQUIS ) 5 MG TABS tablet Take 2 tablets (10 mg total) by mouth 2 (two) times daily for 3 days, THEN 1 tablet (5 mg total) 2 (two) times daily. 10/14/23 04/14/24  Garrison Kanner, MD  aspirin  EC 81 MG tablet Take 81 mg by mouth at bedtime. Pt states he has resumed as of 09/26/23    [provider]  atorvastatin  (LIPITOR) 10 MG tablet Take 10 mg by mouth daily. 08/13/23   [provider]  calcium  carbonate (OS-CAL - DOSED IN MG OF ELEMENTAL CALCIUM ) 1250 (500 Ca) MG tablet Take 1 tablet (1,250 mg total) by mouth 2 (two) times daily with a meal. 08/08/23   Melodi Sprung, DO  feeding supplement (ENSURE ENLIVE / ENSURE PLUS) LIQD Take 237 mLs by mouth 3 (three) times daily between meals. 10/14/23   Garrison Kanner, MD  iron  polysaccharides (NIFEREX) 150 MG capsule Take 1 capsule (150 mg total) by mouth daily. Can be any iron  supplement. 10/15/23   Garrison Kanner, MD  linezolid  (ZYVOX ) 600 MG tablet Take 1 tablet (600 mg total) by mouth every 12 (twelve) hours for 17 days. 10/14/23 10/31/23  Garrison Kanner, MD  midodrine  (PROAMATINE ) 10 MG tablet Take 1 tablet (10 mg total) by mouth 3 (three) times daily with meals. 10/14/23   Garrison Kanner, MD  Multiple Vitamin (MULTIVITAMIN WITH MINERALS) TABS tablet Take 1 tablet by mouth daily. 08/09/23   Melodi Sprung, DO    Physical Exam: Vitals:   10/30/23 1859 10/30/23 1915 10/30/23 2006 10/30/23 2120  BP: 119/67 110/69 115/63 123/77  Pulse: 75 79 85 (!) 101  Resp: 14 16 20 20   Temp: 97.6 F (36.4 C) 97.6 F (36.4 C) 98.5 F (36.9 C) 97.6 F (36.4 C)  TempSrc: Oral Oral  Oral  SpO2:   91% 100%  Weight:      Height:       General: Not in acute distress.  Pale looking HEENT:       Eyes: PERRL, EOMI, no jaundice       ENT: No discharge  from the ears and nose, no pharynx injection, no tonsillar enlargement.        Neck: No JVD, no bruit, no mass felt. Heme: No neck lymph node enlargement. Cardiac: S1/S2, RRR, No gallops or rubs. Respiratory: No rales, wheezing, rhonchi or rubs. GI: Soft, nondistended, nontender, no rebound pain, no organomegaly, BS present. GU: No hematuria Ext: has mild leg edema bilaterally. 1+DP/PT pulse bilaterally. Musculoskeletal: No joint deformities, No joint redness or warmth, no limitation of ROM in spin. Skin: No rashes.  Has bruise in both forearms Neuro: Alert, oriented X3, cranial nerves II-XII grossly intact, moves all extremities normally. Psych: Patient is not psychotic, no suicidal or hemocidal ideation.  Labs on Admission: I have personally reviewed following labs and imaging studies  CBC: Recent Labs  Lab 10/30/23  1539  WBC 7.1  NEUTROABS 6.1  HGB 6.4*  HCT 20.1*  MCV 102.6*  PLT 119*   Basic Metabolic Panel: Recent Labs  Lab 10/30/23 1539  NA 132*  K 4.9  CL 103  CO2 17*  GLUCOSE 97  BUN 43*  CREATININE 2.03*  CALCIUM  8.4*   GFR: Estimated Creatinine Clearance: 23.3 mL/min (A) (by C-G formula based on SCr of 2.03 mg/dL (H)). Liver Function Tests: Recent Labs  Lab 10/30/23 1539  AST 33  ALT 34  ALKPHOS 89  BILITOT 0.5  PROT 5.3*  ALBUMIN 2.4*   No results for input(s): "LIPASE", "AMYLASE" in the last 168 hours. No results for input(s): "AMMONIA" in the last 168 hours. Coagulation Profile: No results for input(s): "INR", "PROTIME" in the last 168 hours. Cardiac Enzymes: No results for input(s): "CKTOTAL", "CKMB", "CKMBINDEX", "TROPONINI" in the last 168 hours. BNP (last 3 results) No results for input(s): "PROBNP" in the last 8760 hours. HbA1C: No results for input(s): "HGBA1C" in the last 72 hours. CBG: No results for input(s): "GLUCAP" in the last 168 hours. Lipid Profile: No results for input(s): "CHOL", "HDL", "LDLCALC", "TRIG", "CHOLHDL",  "LDLDIRECT" in the last 72 hours. Thyroid  Function Tests: No results for input(s): "TSH", "T4TOTAL", "FREET4", "T3FREE", "THYROIDAB" in the last 72 hours. Anemia Panel: Recent Labs    10/30/23 1539  FOLATE >40.0  FERRITIN 475*  TIBC 249*  IRON  234*   Urine analysis:    Component Value Date/Time   COLORURINE YELLOW (A) 09/30/2023 0557   APPEARANCEUR CLOUDY (A) 09/30/2023 0557   APPEARANCEUR Hazy (A) 08/12/2023 1141   LABSPEC 1.015 09/30/2023 0557   PHURINE 5.0 09/30/2023 0557   GLUCOSEU NEGATIVE 09/30/2023 0557   HGBUR SMALL (A) 09/30/2023 0557   BILIRUBINUR NEGATIVE 09/30/2023 0557   BILIRUBINUR Negative 08/12/2023 1141   KETONESUR NEGATIVE 09/30/2023 0557   PROTEINUR 30 (A) 09/30/2023 0557   UROBILINOGEN 1.0 06/30/2019 0823   NITRITE NEGATIVE 09/30/2023 0557   LEUKOCYTESUR LARGE (A) 09/30/2023 0557   Sepsis Labs: @LABRCNTIP (procalcitonin:4,lacticidven:4) )No results found for this or any previous visit (from the past 240 hours).   Radiological Exams on Admission:   Assessment/Plan Principal Problem:   Symptomatic anemia Active Problems:   Iron  deficiency anemia   Chronic systolic CHF (congestive heart failure) (HCC)   Thrombocytopenia (HCC)   Cavitary pneumonia   Pulmonary embolism (HCC)   CAD (coronary artery disease)   HLD (hyperlipidemia)   CKD (chronic kidney disease) stage 4, GFR 15-29 ml/min (HCC)   Protein-calorie malnutrition, severe   Assessment and Plan:  Symptomatic anemia and iron  deficiency anemia: Hgb dropped from 8.0 --> 6.4.  Patient is taking Eliquis .  Suspecting GI bleeding.  Consulted Dr. Emerick Hanlon for GI.   - will place in tele bed for obs -Hold Eliquis  and aspirin  - transfuse 2 units of blood now - NPO for possible procedure - IVF: will not give IVF due to low EF of 35-40% - Start IV pantoprazole   40 mg bid - Check anemia panel - Zofran  IV for nausea - Avoid NSAIDs and SQ heparin  - Maintain IV access (2 large bore IVs if  possible). - Monitor closely and follow q6h cbc, transfuse as necessary, if Hgb<7.0 - LaB: INR, PTT and type screen   Chronic systolic CHF (congestive heart failure) (HCC): 2D echo on 10/08/2023 showed EF of 35 to 40%.  Patient has 1+ leg edema, elevated BNP 377, at high risk of developing CHF exacerbation. - Give 20 mg of Lasix  IV  now  Thrombocytopenia (HCC) -Follow-up by CBC - Check LDH and peripheral smear  Recent cavitary pneumonia and MRSA bacteremia: Currently no fever or leukocytosis. -Patient to finish Zyvox  treatment for 2 more doses.  Pulmonary embolism (HCC) -Hold Eliquis  temporarily  CAD (coronary artery disease) -Hold aspirin  - Continue Lipitor  HLD (hyperlipidemia) -Lipitor  CKD (chronic kidney disease) stage 4, GFR 15-29 ml/min (HCC): Renal function at baseline.  Recent baseline creatinine 2.13 on 10/23/2023.  His creatinine is 2.03, BNP 43, GFR 33 -Follow-up by BMP  Protein-calorie malnutrition, severe: Body weight 56.7 kg, BMI 16.05 - Nutrition consult - Need to start nutrition supplement after endoscopy is performed      DVT ppx: SCD  Code Status: DNR  Family Communication:     not done, no family member is at bed side.    Disposition Plan:  Anticipate discharge back to previous environment, SNF  Consults called:  Dr. Emerick Hanlon for GI is consulted.  Admission status and Level of care: Telemetry Medical:    for obs          Dispo: The patient is from: SNF              Anticipated d/c is to: SNF              Anticipated d/c date is: 1 day              Patient currently is not medically stable to d/c.    Severity of Illness:  The appropriate patient status for this patient is OBSERVATION. Observation status is judged to be reasonable and necessary in order to provide the required intensity of service to ensure the patient's safety. The patient's presenting symptoms, physical exam findings, and initial radiographic and laboratory data in the  context of their medical condition is felt to place them at decreased risk for further clinical deterioration. Furthermore, it is anticipated that the patient will be medically stable for discharge from the hospital within 2 midnights of admission.        Date of Service 10/30/2023    Fidencio Hue [facility]   If 7PM-7AM, please contact night-coverage [email or web address] 10/30/2023, 10:31 PM

## 2023-10-30 NOTE — ED Notes (Signed)
 CCMD contacted

## 2023-10-30 NOTE — ED Notes (Signed)
 Placed in recliner for comfort.  Chair alarm remains in place.

## 2023-10-30 NOTE — ED Triage Notes (Addendum)
 Patient from [facility] for hgb 6.8; patient complaining of weakness and shortness of breath. Patient on Eliquis , denies blood in foley or in stool.

## 2023-10-31 DIAGNOSIS — I2585 Chronic coronary microvascular dysfunction: Secondary | ICD-10-CM | POA: Diagnosis not present

## 2023-10-31 DIAGNOSIS — I5022 Chronic systolic (congestive) heart failure: Secondary | ICD-10-CM | POA: Diagnosis not present

## 2023-10-31 DIAGNOSIS — K922 Gastrointestinal hemorrhage, unspecified: Secondary | ICD-10-CM

## 2023-10-31 DIAGNOSIS — D649 Anemia, unspecified: Secondary | ICD-10-CM | POA: Diagnosis not present

## 2023-10-31 DIAGNOSIS — I2699 Other pulmonary embolism without acute cor pulmonale: Secondary | ICD-10-CM | POA: Diagnosis not present

## 2023-10-31 DIAGNOSIS — L8990 Pressure ulcer of unspecified site, unspecified stage: Secondary | ICD-10-CM

## 2023-10-31 DIAGNOSIS — D509 Iron deficiency anemia, unspecified: Secondary | ICD-10-CM | POA: Diagnosis not present

## 2023-10-31 DIAGNOSIS — E785 Hyperlipidemia, unspecified: Secondary | ICD-10-CM | POA: Diagnosis not present

## 2023-10-31 DIAGNOSIS — D696 Thrombocytopenia, unspecified: Secondary | ICD-10-CM | POA: Diagnosis not present

## 2023-10-31 LAB — CBC
HCT: 20.7 % — ABNORMAL LOW (ref 39.0–52.0)
HCT: 24.2 % — ABNORMAL LOW (ref 39.0–52.0)
Hemoglobin: 7.2 g/dL — ABNORMAL LOW (ref 13.0–17.0)
Hemoglobin: 8.4 g/dL — ABNORMAL LOW (ref 13.0–17.0)
MCH: 32.7 pg (ref 26.0–34.0)
MCH: 32.3 pg (ref 26.0–34.0)
MCHC: 34.8 g/dL (ref 30.0–36.0)
MCHC: 34.7 g/dL (ref 30.0–36.0)
MCV: 94.1 fL (ref 80.0–100.0)
MCV: 93.1 fL (ref 80.0–100.0)
Platelets: 93 10*3/uL — ABNORMAL LOW (ref 150–400)
Platelets: 83 10*3/uL — ABNORMAL LOW (ref 150–400)
RBC: 2.2 MIL/uL — ABNORMAL LOW (ref 4.22–5.81)
RBC: 2.6 MIL/uL — ABNORMAL LOW (ref 4.22–5.81)
RDW: 15.3 % (ref 11.5–15.5)
RDW: 15.1 % (ref 11.5–15.5)
WBC: 5.3 10*3/uL (ref 4.0–10.5)
WBC: 4.5 10*3/uL (ref 4.0–10.5)
nRBC: 0 % (ref 0.0–0.2)
nRBC: 0 % (ref 0.0–0.2)

## 2023-10-31 LAB — BASIC METABOLIC PANEL WITH GFR
Anion gap: 10 (ref 5–15)
BUN: 46 mg/dL — ABNORMAL HIGH (ref 8–23)
CO2: 20 mmol/L — ABNORMAL LOW (ref 22–32)
Calcium: 8.4 mg/dL — ABNORMAL LOW (ref 8.9–10.3)
Chloride: 102 mmol/L (ref 98–111)
Creatinine, Ser: 2.19 mg/dL — ABNORMAL HIGH (ref 0.61–1.24)
GFR, Estimated: 30 mL/min — ABNORMAL LOW (ref >60)
Glucose, Bld: 81 mg/dL (ref 70–99)
Potassium: 4.6 mmol/L (ref 3.5–5.1)
Sodium: 132 mmol/L — ABNORMAL LOW (ref 135–145)

## 2023-10-31 LAB — HEMOGLOBIN AND HEMATOCRIT, BLOOD
HCT: 24.8 % — ABNORMAL LOW (ref 39.0–52.0)
Hemoglobin: 8.4 g/dL — ABNORMAL LOW (ref 13.0–17.0)

## 2023-10-31 LAB — VITAMIN B12: Vitamin B-12: 1248 pg/mL — ABNORMAL HIGH (ref 180–914)

## 2023-10-31 MED ORDER — VITAMIN C 500 MG PO TABS
500.0000 mg | ORAL_TABLET | Freq: Two times a day (BID) | ORAL | Status: DC
  Administered 2023-10-31 – 2023-11-05 (×10): 500 mg via ORAL
  Filled 2023-10-31 (×10): qty 1

## 2023-10-31 MED ORDER — POLYETHYLENE GLYCOL 3350 17 GM/SCOOP PO POWD
238.0000 g | Freq: Once | ORAL | Status: AC
  Administered 2023-10-31: 238 g via ORAL
  Filled 2023-10-31: qty 238

## 2023-10-31 MED ORDER — BOOST / RESOURCE BREEZE PO LIQD CUSTOM: 1.0000 | Freq: Three times a day (TID) | ORAL | Status: DC

## 2023-10-31 NOTE — Progress Notes (Signed)
 PROGRESS NOTE    Zachary [name]  ZOX:[id number] DOB: [birth year] DOA: 10/30/2023 PCP: [name], MD  Chief Complaint  Patient presents with   Weakness    Hospital Course:  This is an 83 year old male with hypotension on midodrine , hyperlipidemia, CAD, systolic CHF, CKD stage IV, anemia of chronic disease, history of pulmonary embolism on Eliquis , ESBL UTI, status post suprapubic catheter placement, and recent MRSA bacteremia secondary to cavitary pneumonia currently on linezolid .  He presents with weakness and shortness of breath from his skilled nursing facility.  Patient was just hospitalized 4/29 through 5/13 with MRSA bacteremia secondary to cavitary pneumonia.  He was discharged on linezolid  with plans to continue medication from 5/13 through 5/30.  He is following outpatient with infectious disease. Patient has been short of breath, with occasional cough but no chest pain.  He denies any dark stools or rectal bleeding.  He had labs at the skilled nursing facility which revealed a hemoglobin of 6.8.  In the ED hemoglobin found to be 6.4.  He was hemodynamically stable and other labs unremarkable.  Gastroenterology was consulted.  Subjective: Patient has no acute complaints.   Objective: Vitals:   10/31/23 0300 10/31/23 0318 10/31/23 0413 10/31/23 0632  BP: 101/70 118/69 114/69 121/74  Pulse: 70 67 70 76  Resp: 18 19 20 18   Temp: 97.6 F (36.4 C) 97.7 F (36.5 C) 97.6 F (36.4 C) (!) 97.5 F (36.4 C)  TempSrc: Oral Oral Oral Oral  SpO2: 100% 100% 100% 100%  Weight:      Height:        Intake/Output Summary (Last 24 hours) at 10/31/2023 0805 Last data filed at 10/31/2023 8119 Gross per 24 hour  Intake 684.5 ml  Output 200 ml  Net 484.5 ml   Filed Weights   10/30/23 1840  Weight: 56.7 kg    Examination: General exam: Appears calm and comfortable, NAD, frail, weak Respiratory system: No work of breathing, symmetric chest wall expansion Cardiovascular  system: S1 & S2 heard, RRR.  Gastrointestinal system: Abdomen is nondistended, soft and nontender.  Neuro: Alert and oriented. No focal neurological deficits. Extremities: Symmetric, expected ROM Skin: Extremities covered in bruises in multiple stages of healing, some oozing wounds, Psychiatry: Demonstrates appropriate judgement and insight. Mood & affect appropriate for situation.   Assessment & Plan:  Principal Problem:   Symptomatic anemia Active Problems:   Iron  deficiency anemia   Chronic systolic CHF (congestive heart failure) (HCC)   Thrombocytopenia (HCC)   Cavitary pneumonia   Pulmonary embolism (HCC)   CAD (coronary artery disease)   HLD (hyperlipidemia)   CKD (chronic kidney disease) stage 4, GFR 15-29 ml/min (HCC)   Protein-calorie malnutrition, severe    Acute on chronic anemia History of iron  deficiency anemia  Anemia of chronic disease - Baseline hemoglobin appears to be 8.0, has dropped to 6.4 - Patient is taking Eliquis , have held - Suspected GI bleed - Gastroenterology consulted, planning for endoscopy and colonoscopy tomorrow - Last colonoscopy in 2021 with benign polyps - 2 units PRBCs given - CLD for now.  N.p.o. at midnight.  Bowel prep. - PPI twice daily - Anemia panel: elevated B12, elevated ferritin, elevated iron  levels and TSAT.  Appears to all be secondary to anemia of chronic disease.  No real evidence of iron  deficiency.  Unclear if there is acute GI loss  Chronic systolic congestive heart failure - Echo 10/12/2023: EF 35 to 40% - Lower extremity edema on arrival, BNP 377 - Monitor  volume status closely - Has received 20 mg IV Lasix   Thrombocytopenia - Complicated by symptomatic acute anemia as above - May also be secondary to prolonged course of linezolid  which is now discontinued - Normal platelet morphology on smear review - LDH WNL - Will monitor CBC closely.  Transfusions as above  Recent cavitary pneumonia Recent MRSA bacteremia -  Has been on extended course of Zyvox .  Final dose today - Currently afebrile without leukocytosis - Antibiotic course now completed  Pulmonary embolism - Meant to be on Eliquis , holding for now given anemia  CAD - Hold aspirin  - Continue Lipitor  Hyperlipidemia - Continue statin  CKD stage IV - Baseline creatinine 2.1 - Currently stable at baseline - Follow-up closely.  Protein calorie malnutrition, severe BMI 16 - Nutrition consult  Hyponatremia - Chronic.  Appears close to baseline currently - Repeat BMP in a.m.  DVT prophylaxis: SCDs, avoid chemical anticoagulation given acute anemia   Code Status: Limited: Do not attempt resuscitation (DNR) -DNR-LIMITED -Do Not Intubate/DNI  Disposition: Admit observation.  Pending endoscopy and colonoscopy tomorrow.  Consultants:  Treatment Team:  Consulting Physician: Marnee Sink, MD  Procedures:    Antimicrobials:  Anti-infectives (From admission, onward)    Start     Dose/Rate Route Frequency Ordered Stop   10/30/23 2315  linezolid  (ZYVOX ) tablet 600 mg        600 mg Oral Every 12 hours 10/30/23 2217 10/31/23 2159       Data Reviewed: I have personally reviewed following labs and imaging studies CBC: Recent Labs  Lab 10/30/23 1539 10/30/23 2236 10/31/23 0130 10/31/23 0649  WBC 7.1 5.6 5.3 4.5  NEUTROABS 6.1  --   --   --   HGB 6.4* 7.2* 7.2* 8.4*  HCT 20.1* 22.0* 20.7* 24.2*  MCV 102.6* 96.1 94.1 93.1  PLT 119* 108* 93* 83*   Basic Metabolic Panel: Recent Labs  Lab 10/30/23 1539 10/31/23 0649  NA 132* 132*  K 4.9 4.6  CL 103 102  CO2 17* 20*  GLUCOSE 97 81  BUN 43* 46*  CREATININE 2.03* 2.19*  CALCIUM  8.4* 8.4*   GFR: Estimated Creatinine Clearance: 21.6 mL/min (A) (by C-G formula based on SCr of 2.19 mg/dL (H)). Liver Function Tests: Recent Labs  Lab 10/30/23 1539  AST 33  ALT 34  ALKPHOS 89  BILITOT 0.5  PROT 5.3*  ALBUMIN 2.4*   CBG: No results for input(s): "GLUCAP" in the last  168 hours.  No results found for this or any previous visit (from the past 240 hours).   Radiology Studies: No results found.  Scheduled Meds:  atorvastatin   10 mg Oral Daily   iron  polysaccharides  150 mg Oral Daily   linezolid   600 mg Oral Q12H   midodrine   10 mg Oral TID WC   multivitamin with minerals  1 tablet Oral Daily   pantoprazole (PROTONIX) IV  40 mg Intravenous Q12H   Continuous Infusions:  sodium chloride  Stopped (10/30/23 1829)     LOS: 1 day  MDM: Patient is high risk for one or more organ failure.  They necessitate ongoing hospitalization for continued IV therapies and subsequent lab monitoring. Total time spent interpreting labs and vitals, reviewing the medical record, coordinating care amongst consultants and care team members, directly assessing and discussing care with the patient and/or family: 55 min  Zachary Lukehart, DO [facility]  To contact the attending physician between 7A-7P please use Epic Chat. To contact the covering physician during after  hours 7P-7A, please review Amion.  10/31/2023, 8:05 AM   *This document has been created with the assistance of dictation software. Please excuse typographical errors. *

## 2023-10-31 NOTE — Care Management Important Message (Signed)
 Important Message  Patient Details  Name: Zachary [name] MRN: [id number] Date of Birth: [birth year]   Important Message Given:  Yes - Medicare IM     Anise Kerns 10/31/2023, 1:07 PM

## 2023-10-31 NOTE — Consult Note (Signed)
 Marnee Sink, MD [facility]  [address], [address] [place], [place] [address] Phone: [phone] Fax : [phone]  Consultation  Referring Provider:     Dr. Rosalea Collin Primary Care Physician:  [name], MD Primary Gastroenterologist:  Dr. Ole Berkeley         Reason for Consultation:     Acute on chronic anemia  Date of Admission:  10/30/2023 Date of Consultation:  10/31/2023         HPI:   Zachary [name] is a 83 y.o. male who is sent to the emergency department due to weakness and shortness of breath and was found to have a hemoglobin of 6..  The patient has been on Eliquis .  The patient has a past medical history of hypertension, hyperlipidemia, coronary artery disease, thoracic aortic aneurysm and a admission in April of this year for sepsis due to pneumonia.  The patient had reported to the emergency department that he had been having generalized weakness for about a month.  There is no report of any black stools or bloody stools. The patient had a colonoscopy in 2020 with a large polyp removed at that time and a repeat colonoscopy was recommended in 1 year.  The patient's repeat colonoscopy showed polyps with the pathology showing:  A. COLON POLYP, CECUM; COLD SNARE:  - SESSILE SERRATED POLYP.  - NEGATIVE FOR DYSPLASIA AND MALIGNANCY.  B. COLON POLYPS X3, TRANSVERSE; COLD SNARE:  - FRAGMENTS (X3) OF TUBULAR ADENOMAS.  - NEGATIVE FOR HIGH-GRADE DYSPLASIA AND MALIGNANCY.   The patient's most recent labs have shown:  Component     Latest Ref Rng 10/23/2023 10/30/2023 10/31/2023  Hemoglobin     13.0 - 17.0 g/dL 8.0 (L)  7.2 (L)  8.4 (L)   Hemoglobin       6.4 (L)  8.4 (L)   Hemoglobin        7.2 (L)   HCT     39.0 - 52.0 % 25.2 (L)  22.0 (L)  24.8 (L)   HCT       20.1 (L)  24.2 (L)   HCT        20.7 (L)    The patient follows with ID for MRSA.  The patient was given 2 units of packed red blood cells and states that he is feeling much better today.  The patient's iron   studies have shown:  Component     Latest Ref Rng 09/26/2023 10/30/2023  Iron      45 - 182 ug/dL 41 (L)  130 (H)   TIBC     250 - 450 ug/dL 865  784 (L)   Saturation Ratios     17.9 - 39.5 % 15 (L)  94 (H)   UIBC     ug/dL 696  15    The patient is on iron  supplementation which likely accounts for the improvement in the iron  studies.  Past Medical History:  Diagnosis Date   B12 deficiency anemia 08/03/2019   Hypercholesterolemia    Hypertension    Vertigo    last episode approx 09/2018    Past Surgical History:  Procedure Laterality Date   APPENDECTOMY     CATARACT EXTRACTION Bilateral    COLONOSCOPY WITH PROPOFOL  N/A 12/24/2018   Procedure: COLONOSCOPY WITH PROPOFOL ;  Surgeon: Marnee Sink, MD;  Location: [facility];  Service: Endoscopy;  Laterality: N/A;   COLONOSCOPY WITH PROPOFOL  N/A 12/13/2019   Procedure: COLONOSCOPY WITH BIOPSY ;  Surgeon: Marnee Sink,  MD;  Location: [facility];  Service: Endoscopy;  Laterality: N/A;  priority 3   IR CYSTOSTOMY TUBE PLACEMENT/BLADDER ASPIRATION  08/26/2023   PLEURAL SCARIFICATION  05/2015   POLYPECTOMY N/A 12/13/2019   Procedure: POLYPECTOMY;  Surgeon: Marnee Sink, MD;  Location: [facility];  Service: Endoscopy;  Laterality: N/A;   TEE WITHOUT CARDIOVERSION N/A 10/07/2023   Procedure: ECHOCARDIOGRAM, TRANSESOPHAGEAL;  Surgeon: Alluri, Odessa Bene, MD;  Location: [facility];  Service: Cardiovascular;  Laterality: N/A;    Prior to Admission medications   Medication Sig Start Date End Date Taking? Authorizing Provider  acetaminophen  (TYLENOL ) 325 MG tablet Take 2 tablets (650 mg total) by mouth every 6 (six) hours as needed for mild pain (pain score 1-3), headache or fever (or Fever >/= 101). 08/08/23  Yes Alexander, Natalie, DO  atorvastatin  (LIPITOR) 10 MG tablet Take 10 mg by mouth daily. 08/13/23  Yes [provider]  iron  polysaccharides (NIFEREX) 150 MG capsule Take 1 capsule (150 mg total) by mouth daily. Can be  any iron  supplement. 10/15/23  Yes Garrison Kanner, MD  midodrine  (PROAMATINE ) 10 MG tablet Take 1 tablet (10 mg total) by mouth 3 (three) times daily with meals. 10/14/23  Yes Garrison Kanner, MD  Multiple Vitamin (MULTIVITAMIN WITH MINERALS) TABS tablet Take 1 tablet by mouth daily. 08/09/23  Yes Alexander, Natalie, DO  apixaban  (ELIQUIS ) 5 MG TABS tablet Take 2 tablets (10 mg total) by mouth 2 (two) times daily for 3 days, THEN 1 tablet (5 mg total) 2 (two) times daily. Patient not taking: Reported on 10/30/2023 10/14/23 04/14/24  Garrison Kanner, MD  aspirin  EC 81 MG tablet Take 81 mg by mouth at bedtime. Pt states he has resumed as of 09/26/23 Patient not taking: Reported on 10/30/2023    [provider]  calcium  carbonate (OS-CAL - DOSED IN MG OF ELEMENTAL CALCIUM ) 1250 (500 Ca) MG tablet Take 1 tablet (1,250 mg total) by mouth 2 (two) times daily with a meal. Patient not taking: Reported on 10/30/2023 08/08/23   Melodi Sprung, DO  feeding supplement (ENSURE ENLIVE / ENSURE PLUS) LIQD Take 237 mLs by mouth 3 (three) times daily between meals. 10/14/23   Garrison Kanner, MD  linezolid  (ZYVOX ) 600 MG tablet Take 1 tablet (600 mg total) by mouth every 12 (twelve) hours for 17 days. Patient not taking: Reported on 10/30/2023 10/14/23 10/31/23  Garrison Kanner, MD    Family History  Problem Relation Age of Onset   Hypertension Mother    Hyperlipidemia Mother    Heart attack Father    Hypertension Father    CVA Father    ALS Brother    Prostate cancer Brother      Social History   Tobacco Use   Smoking status: Never   Smokeless tobacco: Never  Vaping Use   Vaping status: Never Used  Substance Use Topics   Alcohol  use: No    Alcohol /week: 0.0 standard drinks of alcohol    Drug use: No    Allergies as of 10/30/2023   (No Known Allergies)    Review of Systems:    All systems reviewed and negative except where noted in HPI.   Physical Exam:  Vital signs in last 24 hours: Temp:  [97.5 F (36.4 C)-98.5 F  (36.9 C)] 97.8 F (36.6 C) (05/30 0840) Pulse Rate:  [66-101] 66 (05/30 0840) Resp:  [12-20] 20 (05/30 0840) BP: (101-123)/(60-80) 118/70 (05/30 0840) SpO2:  [91 %-100 %] 97 % (05/30 0840) Weight:  [56.7 kg]  56.7 kg (05/29 1840) Last BM Date : 10/29/23 General:   Pleasant, cooperative in NAD Head:  Normocephalic and atraumatic. Eyes:   No icterus.   Conjunctiva pink. PERRLA. Ears:  Normal auditory acuity. Neck:  Supple; no masses or thyroidomegaly Lungs: Respirations even and unlabored. Lungs clear to auscultation bilaterally.   No wheezes, crackles, or rhonchi.  Heart:  Regular rate and rhythm;  Without murmur, clicks, rubs or gallops Abdomen:  Soft, nondistended, nontender. Normal bowel sounds. No appreciable masses or hepatomegaly.  No rebound or guarding.  Rectal:  Not performed. Msk:  Symmetrical without gross deformities.    Extremities:  Without edema, cyanosis or clubbing.  Ecchymosis and bandages on the upper extremities. Neurologic:  Alert and oriented x3;  grossly normal neurologically. Skin:  Intact without significant lesions or rashes. Cervical Nodes:  No significant cervical adenopathy. Psych:  Alert and cooperative. Normal affect.  LAB RESULTS: Recent Labs    10/30/23 2236 10/31/23 0130 10/31/23 0649 10/31/23 0838  WBC 5.6 5.3 4.5  --   HGB 7.2* 7.2* 8.4* 8.4*  HCT 22.0* 20.7* 24.2* 24.8*  PLT 108* 93* 83*  --    BMET Recent Labs    10/30/23 1539 10/31/23 0649  NA 132* 132*  K 4.9 4.6  CL 103 102  CO2 17* 20*  GLUCOSE 97 81  BUN 43* 46*  CREATININE 2.03* 2.19*  CALCIUM  8.4* 8.4*   LFT Recent Labs    10/30/23 1539  PROT 5.3*  ALBUMIN 2.4*  AST 33  ALT 34  ALKPHOS 89  BILITOT 0.5   PT/INR Recent Labs    10/30/23 2236  LABPROT 15.2  INR 1.2    STUDIES: No results found.    Impression / Plan:   Assessment: Principal Problem:   Symptomatic anemia Active Problems:   CKD (chronic kidney disease) stage 4, GFR 15-29 ml/min (HCC)    Protein-calorie malnutrition, severe   Cavitary pneumonia   HLD (hyperlipidemia)   CAD (coronary artery disease)   Pulmonary embolism (HCC)   Iron  deficiency anemia   Chronic systolic CHF (congestive heart failure) (HCC)   Thrombocytopenia (HCC)   Zachary [name] is a 83 y.o. y/o male with acute on chronic anemia.  The patient has a history of colon polyps in the past with his last colonoscopy being in 2021.  The patient denies any black stools or bloody stools.  He has been transfused and feels better this morning.  The patient has a history of chronic kidney disease which may be contributing to his anemia.  The patient's iron  studies do not show iron  deficiency anemia at this time but 1 month ago prior to the starting of iron  that was a low iron  and a low iron  saturation.  Plan:  The patient will be evaluated for his iron  deficiency anemia corrected by his iron  supplementation with continued drop in his hemoglobin.  His anemia is likely multifactorial and contributed to by his kidney disease.  I have discussed the findings with the patient and he states he would like everything done to help his health including a luminal evaluation.  The patient will be set up for an EGD and colonoscopy for tomorrow.  It appears that the patient's last dose of Eliquis  was prior to coming to the emergency department yesterday.  The patient has been explained the plan and agrees with it.  Thank you for involving me in the care of this patient.      LOS: 1 day   Nijah Orlich  Ole Berkeley, MD, MD. Sylvan Evener 10/31/2023, 8:58 AM,  Pager [phone] 7am-5pm  Check AMION for 5pm -7am coverage and on weekends   Note: This dictation was prepared with Dragon dictation along with smaller phrase technology. Any transcriptional errors that result from this process are unintentional.

## 2023-10-31 NOTE — Care Management Obs Status (Deleted)
 MEDICARE OBSERVATION STATUS NOTIFICATION   Patient Details  Name: Zachary [name] MRN: [id number] Date of Birth: 1940/06/13   Medicare Observation Status Notification Given:       Elsie Halo, RN 10/31/2023, 2:57 PM

## 2023-10-31 NOTE — Care Management Obs Status (Signed)
 MEDICARE OBSERVATION STATUS NOTIFICATION   Patient Details  Name: Zachary [name] MRN: [id number] Date of Birth: [birth year]   Medicare Observation Status Notification Given:  Yes    Elsie Halo, RN 10/31/2023, 3:00 PM

## 2023-10-31 NOTE — Plan of Care (Signed)
  Problem: Health Behavior/Discharge Planning: Goal: Ability to manage health-related needs will improve Outcome: Progressing   Problem: Clinical Measurements: Goal: Will remain free from infection Outcome: Progressing Goal: Diagnostic test results will improve Outcome: Progressing Goal: Respiratory complications will improve Outcome: Progressing   Problem: Activity: Goal: Risk for activity intolerance will decrease Outcome: Progressing   Problem: Nutrition: Goal: Adequate nutrition will be maintained Outcome: Progressing   Problem: Coping: Goal: Level of anxiety will decrease Outcome: Progressing   Problem: Elimination: Goal: Will not experience complications related to bowel motility Outcome: Progressing   Problem: Pain Managment: Goal: General experience of comfort will improve and/or be controlled Outcome: Progressing   Problem: Safety: Goal: Ability to remain free from injury will improve Outcome: Progressing   Problem: Skin Integrity: Goal: Risk for impaired skin integrity will decrease Outcome: Progressing

## 2023-10-31 NOTE — Consult Note (Addendum)
 WOC Nurse Consult Note: Reason for Consult: multiple wounds present on admission  Wound type: full thickness skin tears r/t trauma some appear fresh some appear old  Pressure Injury POA: NA, not related to pressure  Measurement:see nursing flowsheet  Wound bed: B hands small pink and moist with skin flap covering partial wound, R elbow old and dry (pink dry and dark eschar), R arm old 100% dry hemorrhagic tissue, L upper arm pink moist, L elbow red moist some old steri strips still in place Drainage (amount, consistency, odor) appear to be either dry or  serosanguinous  Periwound: ecchymosis  Dressing procedure/placement/frequency: Cleanse skin tears to B hands, B elbows and arms with NS, apply Xeroform gauze ([place] [phone]) to wound beds daily and secure with silicone foam or dry gauze and Kerlix roll gauze whichever is preferred depending on location.  MOISTEN DRESSING WITH NS IF STUCK TO WOUND BED FOR ATRAUMATIC REMOVAL.   POC discussed with bedside nurse. WOC team will not follow. Re-consult if further needs arise.   Thank you,    Ronni Colace MSN, RN-BC, [employer] [phone]

## 2023-10-31 NOTE — Progress Notes (Addendum)
 Initial Nutrition Assessment  DOCUMENTATION CODES:   Severe malnutrition in context of chronic illness  INTERVENTION:   Boost Breeze po TID, each supplement provides 250 kcal and 9 grams of protein  Ensure Enlive po TID with diet advancement, each supplement provides 350 kcal and 20 grams of protein.  MVI po daily   Vitamin C  500mg  po BID  Pt at high refeed risk; recommend monitor potassium, magnesium and phosphorus labs daily until stable  Daily weights   NUTRITION DIAGNOSIS:   Severe Malnutrition related to chronic illness as evidenced by percent weight loss, severe fat depletion, severe muscle depletion.  GOAL:   Patient will meet greater than or equal to 90% of their needs  MONITOR:   PO intake, Supplement acceptance, Labs, Weight trends, Skin, I & O's  REASON FOR ASSESSMENT:   Consult Assessment of nutrition requirement/status  ASSESSMENT:   83 y/o male with h/o urinary retention, CKD IV, HLD, CAD, IDA, CHF, tremor, HTN, B12 deficiency, AA, lung nodule, NSTEMI and recent admission for MRSA bacteremia secondary to cavitary pneumonia and who is now admitted with anemia and GIB.  Met with pt in room today. Pt is well known to nutrition department from a recent previous admission. Pt with poor appetite and oral intake at baseline. Pt reports that he has been drinking 1-2 Ensure per day at home. Pt reports weight loss pta. Per chart, pt is down 40lbs(24%) over the past 3 months if his admission weight is accurate; this is severe weight loss. RD discussed with pt the importance of adequate nutrition needed to preserve lean muscle. Pt is agreeable to supplements in hospital. Pt with widespread ecchymosis; will add vitamin C . Pt is likely at high refeed risk. Plan is for EGD/colonoscopy tomorrow.   Medications reviewed and include: midodrine , MVI, protonix   Labs reviewed: Na 132(L), K 4.6 wnl, BUN 46(H), creat 2.19(H) Iron  234(H), TIBC 249(L), ferritin 475(H), folate >40,  B12 1248- 5/29 Hgb 8.4(L), Hct 24.8(L)  NUTRITION - FOCUSED PHYSICAL EXAM:  Flowsheet Row Most Recent Value  Orbital Region Severe depletion  Upper Arm Region Severe depletion  Thoracic and Lumbar Region Severe depletion  Buccal Region Severe depletion  Temple Region Severe depletion  Clavicle Bone Region Severe depletion  Clavicle and Acromion Bone Region Severe depletion  Scapular Bone Region Severe depletion  Dorsal Hand Severe depletion  Patellar Region Severe depletion  Anterior Thigh Region Severe depletion  Posterior Calf Region Severe depletion  Edema (RD Assessment) Mild  Hair Reviewed  Eyes Reviewed  Mouth Reviewed  Skin Reviewed  Nails Reviewed   Diet Order:   Diet Order             Diet NPO time specified  Diet effective midnight           Diet clear liquid Room service appropriate? Yes; Fluid consistency: Thin  Diet effective now                  EDUCATION NEEDS:   Education needs have been addressed  Skin:  Skin Assessment: Reviewed RN Assessment (ecchymosis, skin tears, stage I sacrum)  Last BM:  5/28  Height:   Ht Readings from Last 1 Encounters:  10/30/23 6\' 2"  (1.88 m)    Weight:   Wt Readings from Last 1 Encounters:  10/30/23 56.7 kg    Ideal Body Weight:  86.3 kg  BMI:  Body mass index is 16.05 kg/m.  Estimated Nutritional Needs:   Kcal:  1800-2100kcal/day  Protein:  90-105g/day  Fluid:  1.5-1.7L/day  Torrance Freestone MS, RD, LDN If unable to be reached, please send secure chat to "RD inpatient" available from 8:00a-4:00p daily

## 2023-10-31 NOTE — TOC Initial Note (Signed)
 Transition of Care [facility]) - Initial/Assessment Note    Patient Details  Name: Zachary [name] MRN: [id number] Date of Birth: 1941-03-11  [facility] of [facility] CM/SW Contact:    Odilia Bennett, LCSW Phone Number: 10/31/2023, 3:53 PM  Clinical Narrative:  CSW met with patient. No family at bedside. CSW introduced role and explained that discharge planning would be discussed. Patient went to [facility] for rehab on 5/13 until he was readmitted on 5/29 (16 days). Patient confirmed plan to return at discharge and stated that plan is to transition to LTC after rehab. CSW asked attending physician for PT and OT consults when appropriate. CSW called [facility]. Admissions coordinator is out today but the individual covering (Josh?) said that he does believe plan is to transition to LTC. They do not accept weekend admissions at this time. No further concerns. CSW will continue to follow patient for support and facilitate return to SNF once medically stable.                Expected Discharge Plan: Skilled Nursing Facility Barriers to Discharge: Continued Medical Work up   Patient Goals and CMS Choice     Choice offered to / list presented to : Patient      Expected Discharge Plan and Services     Post Acute Care Choice: Resumption of Svcs/PTA Provider Living arrangements for the past 2 months: Single Family Home, Skilled Nursing Facility                                      Prior Living Arrangements/Services Living arrangements for the past 2 months: Single Family Home, Skilled Nursing Facility Lives with:: Facility Resident Patient language and need for interpreter reviewed:: Yes Do you feel safe going back to the place where you live?: Yes      Need for Family Participation in Patient Care: Yes (Comment) Care giver support system in place?: Yes (comment)   Criminal Activity/Legal Involvement Pertinent to Current Situation/Hospitalization: No - Comment as  needed  Activities of Daily Living      Permission Sought/Granted Permission sought to share information with : Facility Medical sales representative, Family Supports Permission granted to share information with : Yes, Verbal Permission Granted  Share Information with NAME: Domonic Kimball  Permission granted to share info w AGENCY: [facility]  Permission granted to share info w Relationship: Wife  Permission granted to share info w Contact Information: [phone]  Emotional Assessment Appearance:: Appears stated age Attitude/Demeanor/Rapport: Engaged, Gracious Affect (typically observed): Accepting, Appropriate, Calm, Pleasant Orientation: : Oriented to Self, Oriented to Place, Oriented to  Time, Oriented to Situation Alcohol  / Substance Use: Not Applicable Psych Involvement: No (comment)  Admission diagnosis:  Symptomatic anemia [D64.9] Gastrointestinal hemorrhage, unspecified gastrointestinal hemorrhage type [K92.2] Acute on chronic anemia [D64.9] GI bleed [K92.2] Patient Active Problem List   Diagnosis Date Noted   GI bleed 10/31/2023   Pressure injury of skin 10/31/2023   Symptomatic anemia 10/30/2023   HLD (hyperlipidemia) 10/30/2023   CAD (coronary artery disease) 10/30/2023   Pulmonary embolism (HCC) 10/30/2023   Iron  deficiency anemia 10/30/2023   Chronic systolic CHF (congestive heart failure) (HCC) 10/30/2023   Thrombocytopenia (HCC) 10/30/2023   Cavitary pneumonia 10/06/2023   MRSA bacteremia 10/03/2023   Protein-calorie malnutrition, severe 10/01/2023   Sepsis (HCC) 09/30/2023   Pneumonia 09/30/2023   UTI (urinary tract infection) 09/30/2023   Acute encephalopathy 09/30/2023  CKD (chronic kidney disease) stage 4, GFR 15-29 ml/min (HCC) 09/30/2023   NSTEMI (non-ST elevated myocardial infarction) (HCC) 09/30/2023   Normocytic anemia 09/26/2023   Weight loss 09/26/2023   Leukocytosis 09/26/2023   Acute kidney injury superimposed on chronic kidney  disease (HCC) 08/05/2023   Peritonitis (HCC) 08/05/2023   Fluid overload 08/05/2023   Aneurysm of thoracic aorta (HCC) 03/13/2022   Lung nodule 03/13/2022   History of colonic polyps    B12 deficiency anemia 08/03/2019   Elevated MCV 08/03/2019   Cyst, baker's knee, right 07/27/2019   Peripheral edema- right lower leg  07/27/2019   Coronary artery disease involving native coronary artery of native heart without angina pectoris 04/12/2019   Valvular heart disease 04/12/2019   Pneumothorax on right 05/15/2015   Benign essential tremor 02/28/2015   Mixed hyperlipidemia 02/28/2015   Essential hypertension 02/28/2015   Hypercholesterolemia without hypertriglyceridemia 02/28/2015   Vertigo 02/28/2015   PCP:  [name], MD Pharmacy:   [facility] [address] [place] - [address] [place] [address] Phone: [phone] Fax: [phone]     Social Drivers of Health (SDOH) Social History: SDOH Screenings   Food Insecurity: No Food Insecurity (10/31/2023)  Housing: Low Risk  (10/31/2023)  Transportation Needs: No Transportation Needs (10/31/2023)  Utilities: Not At Risk (10/31/2023)  Alcohol  Screen: Low Risk  (09/25/2021)  Depression (PHQ2-9): Low Risk  (09/26/2023)  Financial Resource Strain: Low Risk  (09/25/2021)  Physical Activity: Insufficiently Active (09/25/2021)  Social Connections: Moderately Isolated (10/31/2023)  Stress: No Stress Concern Present (11/24/2019)  Tobacco Use: Low Risk  (10/30/2023)   SDOH Interventions:     Readmission Risk Interventions    10/01/2023    2:47 PM  Readmission Risk Prevention Plan  PCP or Specialist Appt within 3-5 Days Complete  Social Work Consult for Recovery Care Planning/Counseling Complete  Palliative Care Screening Not Applicable

## 2023-10-31 NOTE — Care Management CC44 (Signed)
 Condition Code 44 Documentation Completed  Patient Details  Name: NEERAJ HOUSAND MRN: [id number] Date of Birth: 04/25/1941   Condition Code 44 given:  Yes Patient signature on Condition Code 44 notice:  Yes Documentation of 2 MD's agreement:  Yes Code 44 added to claim:  Yes    Elsie Halo, RN 10/31/2023, 3:01 PM

## 2023-11-01 DIAGNOSIS — I2585 Chronic coronary microvascular dysfunction: Secondary | ICD-10-CM | POA: Diagnosis not present

## 2023-11-01 DIAGNOSIS — D509 Iron deficiency anemia, unspecified: Secondary | ICD-10-CM | POA: Diagnosis not present

## 2023-11-01 DIAGNOSIS — R531 Weakness: Secondary | ICD-10-CM | POA: Diagnosis not present

## 2023-11-01 DIAGNOSIS — D649 Anemia, unspecified: Secondary | ICD-10-CM | POA: Diagnosis not present

## 2023-11-01 DIAGNOSIS — I2699 Other pulmonary embolism without acute cor pulmonale: Secondary | ICD-10-CM | POA: Diagnosis not present

## 2023-11-01 DIAGNOSIS — E785 Hyperlipidemia, unspecified: Secondary | ICD-10-CM | POA: Diagnosis not present

## 2023-11-01 DIAGNOSIS — I5022 Chronic systolic (congestive) heart failure: Secondary | ICD-10-CM | POA: Diagnosis not present

## 2023-11-01 DIAGNOSIS — L8990 Pressure ulcer of unspecified site, unspecified stage: Secondary | ICD-10-CM | POA: Diagnosis not present

## 2023-11-01 DIAGNOSIS — K922 Gastrointestinal hemorrhage, unspecified: Secondary | ICD-10-CM | POA: Diagnosis not present

## 2023-11-01 DIAGNOSIS — D696 Thrombocytopenia, unspecified: Secondary | ICD-10-CM | POA: Diagnosis not present

## 2023-11-01 LAB — TYPE AND SCREEN
ABO/RH(D): O POS
Antibody Screen: NEGATIVE
Unit division: 0
Unit division: 0

## 2023-11-01 LAB — BASIC METABOLIC PANEL WITH GFR
Anion gap: 10 (ref 5–15)
BUN: 39 mg/dL — ABNORMAL HIGH (ref 8–23)
CO2: 21 mmol/L — ABNORMAL LOW (ref 22–32)
Calcium: 8.1 mg/dL — ABNORMAL LOW (ref 8.9–10.3)
Chloride: 99 mmol/L (ref 98–111)
Creatinine, Ser: 1.86 mg/dL — ABNORMAL HIGH (ref 0.61–1.24)
GFR, Estimated: 36 mL/min — ABNORMAL LOW (ref >60)
Glucose, Bld: 79 mg/dL (ref 70–99)
Potassium: 4.6 mmol/L (ref 3.5–5.1)
Sodium: 130 mmol/L — ABNORMAL LOW (ref 135–145)

## 2023-11-01 LAB — BPAM RBC
Blood Product Expiration Date: 202506092359
Blood Product Expiration Date: 202506092359
ISSUE DATE / TIME: 202505291853
ISSUE DATE / TIME: 202505300253
Unit Type and Rh: 5100
Unit Type and Rh: 5100

## 2023-11-01 LAB — MAGNESIUM: Magnesium: 2 mg/dL (ref 1.7–2.4)

## 2023-11-01 LAB — PHOSPHORUS: Phosphorus: 2.5 mg/dL (ref 2.5–4.6)

## 2023-11-01 MED ORDER — BISACODYL 5 MG PO TBEC
10.0000 mg | DELAYED_RELEASE_TABLET | Freq: Once | ORAL | Status: AC
  Administered 2023-11-01: 10 mg via ORAL
  Filled 2023-11-01: qty 2

## 2023-11-01 MED ORDER — PEG 3350-KCL-NA BICARB-NACL 420 G PO SOLR
4000.0000 mL | Freq: Once | ORAL | Status: AC
  Administered 2023-11-01: 4000 mL via ORAL
  Filled 2023-11-01: qty 4000

## 2023-11-01 MED ORDER — PEG 3350-KCL-NA BICARB-NACL 420 G PO SOLR
4000.0000 mL | Freq: Once | ORAL | Status: DC
  Filled 2023-11-01: qty 4000

## 2023-11-01 MED ORDER — PEG 3350-KCL-NA BICARB-NACL 420 G PO SOLR
2000.0000 mL | Freq: Once | ORAL | Status: DC | PRN
  Filled 2023-11-01 (×3): qty 4000

## 2023-11-01 NOTE — Progress Notes (Signed)
 PROGRESS NOTE    Zachary [name]  JYN:[id number] DOB: 15-Jun-1940 DOA: 10/30/2023 PCP: [name], MD  Chief Complaint  Patient presents with   Weakness    Hospital Course:  This is an 83 year old male with hypotension on midodrine , hyperlipidemia, CAD, systolic CHF, CKD stage IV, anemia of chronic disease, history of pulmonary embolism on Eliquis , ESBL UTI, status post suprapubic catheter placement, and recent MRSA bacteremia secondary to cavitary pneumonia currently on linezolid .  He presents with weakness and shortness of breath from his skilled nursing facility.  Patient was just hospitalized 4/29 through 5/13 with MRSA bacteremia secondary to cavitary pneumonia.  He was discharged on linezolid  with plans to continue medication from 5/13 through 5/30.  He is following outpatient with infectious disease. Patient has been short of breath, with occasional cough but no chest pain.  He denies any dark stools or rectal bleeding.  He had labs at the skilled nursing facility which revealed a hemoglobin of 6.8.  In the ED hemoglobin found to be 6.4.  He was hemodynamically stable and other labs unremarkable.  Gastroenterology was consulted.  Subjective: Has no acute complaints.  He reports difficulty with bowel movements.  He is continuing to take bowel prep   Objective: Vitals:   11/01/23 0349 11/01/23 0352 11/01/23 0813 11/01/23 1445  BP:  125/82 111/69 (!) 83/59  Pulse:  64 69 76  Resp:  16 17 17   Temp:  97.6 F (36.4 C) 97.8 F (36.6 C) 97.8 F (36.6 C)  TempSrc:      SpO2:  100% 99% 96%  Weight: 69.7 kg     Height:        Intake/Output Summary (Last 24 hours) at 11/01/2023 1514 Last data filed at 11/01/2023 0352 Gross per 24 hour  Intake 240 ml  Output 1500 ml  Net -1260 ml   Filed Weights   10/30/23 1840 11/01/23 0349  Weight: 56.7 kg 69.7 kg    Examination: General exam: Appears calm and comfortable, NAD, frail, weak Respiratory system: No work of  breathing, symmetric chest wall expansion Cardiovascular system: S1 & S2 heard, RRR.  Gastrointestinal system: Abdomen is nondistended, soft and nontender.  Neuro: Alert and oriented. No focal neurological deficits. Extremities: Symmetric, expected ROM Skin: Extremities covered in bruises in multiple stages of healing, some oozing wounds, Psychiatry: Demonstrates appropriate judgement and insight. Mood & affect appropriate for situation.      Assessment & Plan:  Principal Problem:   Symptomatic anemia Active Problems:   Iron  deficiency anemia   Chronic systolic CHF (congestive heart failure) (HCC)   Thrombocytopenia (HCC)   Cavitary pneumonia   Pulmonary embolism (HCC)   CAD (coronary artery disease)   HLD (hyperlipidemia)   CKD (chronic kidney disease) stage 4, GFR 15-29 ml/min (HCC)   Protein-calorie malnutrition, severe   GI bleed   Pressure injury of skin    Acute on chronic anemia History of iron  deficiency anemia  Anemia of chronic disease - Baseline hemoglobin appears to be 8.0, has dropped to 6.4 - Patient is taking Eliquis , have held - Suspected GI bleed - Gastroenterology consulted, initially planned for EGD and colonoscopy today.  Patient unable to complete bowel prep - Continue with clear liquid diet and bowel prep today.  Hopefully going for endoscopy tomorrow - Last colonoscopy in 2021 with benign polyps - Status post 2 units PRBC, hemoglobin has risen expectedly and is remaining stable at this time - Trend CBC - Anemia panel: elevated B12, elevated ferritin, elevated  iron  levels and TSAT.  Appears to all be secondary to anemia of chronic disease.  No real evidence of iron  deficiency.  Unclear if there is acute GI loss  Chronic systolic congestive heart failure - Echo 10/12/2023: EF 35 to 40% - Some lower extremity edema on arrival, improved now - Status post 20 mg IV Lasix  x 1 - Monitor volume status closely.  Thrombocytopenia - Complicated by  symptomatic acute anemia as above - May also be secondary to prolonged course of linezolid  which is now discontinued - Normal platelet morphology on smear review - Patient does have significant bruising covering his body - LDH within normal limits - Trend CBC closely - Hold all anticoagulation for now  Recent cavitary pneumonia Recent MRSA bacteremia - Has been on extended course of Zyvox .  Course completed 5/30. - Currently afebrile without leukocytosis.  No acute concern for infection.  Pulmonary embolism - Meant to be on Eliquis , holding for now given anemia  CAD - Hold aspirin  - Continue Lipitor  Hyperlipidemia - Continue statin  CKD stage IV - Baseline creatinine 2.1 -Currently near baseline - Continue to follow closely - Renally dosed with creatinine clearance of 31 if needed  Protein calorie malnutrition, severe BMI 16 - Nutrition consult  Hyponatremia - Chronic.  Appears close to baseline currently - AM BMP  DVT prophylaxis: SCDs, avoid chemical anticoagulation given acute anemia   Code Status: Limited: Do not attempt resuscitation (DNR) -DNR-LIMITED -Do Not Intubate/DNI  Disposition: Admit observation.  Pending endoscopy and colonoscopy tomorrow  Consultants:  Treatment Team:  Consulting Physician: Marnee Sink, MD  Procedures:    Antimicrobials:  Anti-infectives (From admission, onward)    Start     Dose/Rate Route Frequency Ordered Stop   10/30/23 2315  linezolid  (ZYVOX ) tablet 600 mg        600 mg Oral Every 12 hours 10/30/23 2217 10/31/23 1036       Data Reviewed: I have personally reviewed following labs and imaging studies CBC: Recent Labs  Lab 10/30/23 1539 10/30/23 2236 10/31/23 0130 10/31/23 0649 10/31/23 0838  WBC 7.1 5.6 5.3 4.5  --   NEUTROABS 6.1  --   --   --   --   HGB 6.4* 7.2* 7.2* 8.4* 8.4*  HCT 20.1* 22.0* 20.7* 24.2* 24.8*  MCV 102.6* 96.1 94.1 93.1  --   PLT 119* 108* 93* 83*  --    Basic Metabolic Panel: Recent  Labs  Lab 10/30/23 1539 10/31/23 0649 11/01/23 0641  NA 132* 132* 130*  K 4.9 4.6 4.6  CL 103 102 99  CO2 17* 20* 21*  GLUCOSE 97 81 79  BUN 43* 46* 39*  CREATININE 2.03* 2.19* 1.86*  CALCIUM  8.4* 8.4* 8.1*  MG  --   --  2.0  PHOS  --   --  2.5   GFR: Estimated Creatinine Clearance: 31.2 mL/min (A) (by C-G formula based on SCr of 1.86 mg/dL (H)). Liver Function Tests: Recent Labs  Lab 10/30/23 1539  AST 33  ALT 34  ALKPHOS 89  BILITOT 0.5  PROT 5.3*  ALBUMIN 2.4*   CBG: No results for input(s): "GLUCAP" in the last 168 hours.  No results found for this or any previous visit (from the past 240 hours).   Radiology Studies: No results found.  Scheduled Meds:  vitamin C   500 mg Oral BID   atorvastatin   10 mg Oral Daily   feeding supplement  1 Container Oral TID BM   iron  polysaccharides  150 mg Oral Daily   midodrine   10 mg Oral TID WC   multivitamin with minerals  1 tablet Oral Daily   pantoprazole  (PROTONIX ) IV  40 mg Intravenous Q12H   Continuous Infusions:  sodium chloride  Stopped (10/30/23 1829)     LOS: 1 day  MDM: Patient is high risk for one or more organ failure.  They necessitate ongoing hospitalization for continued IV therapies and subsequent lab monitoring. Total time spent interpreting labs and vitals, reviewing the medical record, coordinating care amongst consultants and care team members, directly assessing and discussing care with the patient and/or family: 55 min  Yesennia Hirota, DO [facility]  To contact the attending physician between 7A-7P please use Epic Chat. To contact the covering physician during after hours 7P-7A, please review Amion.  11/01/2023, 3:14 PM   *This document has been created with the assistance of dictation software. Please excuse typographical errors. *

## 2023-11-01 NOTE — Progress Notes (Signed)
 Inpatient Follow-up/Progress Note   Patient ID: Zachary [name] is a 83 y.o. male.  Overnight Events / Subjective Findings Patient completed his initial MiraLAX  prep overnight without bowel movement.  He then started GoLytely  prep had some small bowel movements.  No melena or hematochezia.  Hemoglobin today is 8.4 and has stabilized.  Eliquis  is still being held iron  studies more reflective of chronic disease related anemia. No abdominal pain.  CT abdomen pelvis earlier this month without signs of obstruction.  Severe diverticulosis was noted. Will continue preparation in hopes of performing procedures tomorrow.  Review of Systems  Constitutional:  Negative for activity change, appetite change, chills, diaphoresis, fatigue, fever and unexpected weight change.  HENT:  Negative for trouble swallowing and voice change.   Respiratory:  Negative for shortness of breath and wheezing.   Cardiovascular:  Negative for chest pain, palpitations and leg swelling.  Gastrointestinal:  Positive for constipation. Negative for abdominal distention, abdominal pain, anal bleeding, blood in stool, diarrhea, nausea and vomiting.  Skin:  Negative for color change and pallor.  Neurological:  Negative for dizziness, syncope and weakness.  Psychiatric/Behavioral:  Negative for confusion. The patient is not nervous/anxious.   All other systems reviewed and are negative.    Medications  Current Facility-Administered Medications:    0.9 %  sodium chloride  infusion, 10 mL/hr, Intravenous, Once, Collis Deaner, MD, Held at 10/30/23 1829   acetaminophen  (TYLENOL ) tablet 650 mg, 650 mg, Oral, Q6H PRN, Niu, Xilin, MD   albuterol  (PROVENTIL ) (2.5 MG/3ML) 0.083% nebulizer solution 3 mL, 3 mL, Nebulization, Q4H PRN, Niu, Xilin, MD   ascorbic acid  (VITAMIN C ) tablet 500 mg, 500 mg, Oral, BID, Dezii, Alexandra, DO, 500 mg at 10/31/23 2126   atorvastatin  (LIPITOR) tablet 10 mg, 10 mg, Oral, Daily, Niu, Xilin, MD, 10 mg  at 10/31/23 1037   dextromethorphan -guaiFENesin  (MUCINEX  DM) 30-600 MG per 12 hr tablet 1 tablet, 1 tablet, Oral, BID PRN, Niu, Xilin, MD   feeding supplement (BOOST / RESOURCE BREEZE) liquid 1 Container, 1 Container, Oral, TID BM, Dezii, Alexandra, DO   hydrALAZINE  (APRESOLINE ) injection 5 mg, 5 mg, Intravenous, Q2H PRN, Niu, Xilin, MD   iron  polysaccharides (NIFEREX) capsule 150 mg, 150 mg, Oral, Daily, Niu, Xilin, MD, 150 mg at 10/31/23 1036   midodrine  (PROAMATINE ) tablet 10 mg, 10 mg, Oral, TID WC, Niu, Xilin, MD, 10 mg at 10/31/23 1800   multivitamin with minerals tablet 1 tablet, 1 tablet, Oral, Daily, Niu, Xilin, MD, 1 tablet at 10/31/23 1037   ondansetron  (ZOFRAN ) injection 4 mg, 4 mg, Intravenous, Q8H PRN, Niu, Xilin, MD   pantoprazole  (PROTONIX ) injection 40 mg, 40 mg, Intravenous, Q12H, Niu, Xilin, MD, 40 mg at 10/31/23 2126  sodium chloride  Stopped (10/30/23 1829)    acetaminophen , albuterol , dextromethorphan -guaiFENesin , hydrALAZINE , ondansetron  (ZOFRAN ) IV   Objective    Vitals:   10/31/23 1634 10/31/23 2036 11/01/23 0349 11/01/23 0352  BP: 111/72 118/64  125/82  Pulse: 67 65  64  Resp: 16 16  16   Temp: 97.6 F (36.4 C) 97.6 F (36.4 C)  97.6 F (36.4 C)  TempSrc:  Oral    SpO2: 100% 100%  100%  Weight:   69.7 kg   Height:         Physical Exam Vitals and nursing note reviewed.  Constitutional:      General: He is not in acute distress.    Appearance: He is ill-appearing (chronically). He is not toxic-appearing or diaphoretic.  HENT:  Head: Normocephalic and atraumatic.     Nose: Nose normal.     Mouth/Throat:     Mouth: Mucous membranes are moist.     Pharynx: Oropharynx is clear.  Eyes:     General: No scleral icterus.    Extraocular Movements: Extraocular movements intact.  Cardiovascular:     Rate and Rhythm: Normal rate and regular rhythm.     Heart sounds: Normal heart sounds. No murmur heard.    No friction rub. No gallop.  Pulmonary:      Effort: Pulmonary effort is normal. No respiratory distress.     Breath sounds: Normal breath sounds. No wheezing, rhonchi or rales.  Abdominal:     General: Bowel sounds are normal. There is no distension.     Palpations: Abdomen is soft.     Tenderness: There is no abdominal tenderness. There is no guarding or rebound.  Musculoskeletal:     Cervical back: Neck supple.     Right lower leg: No edema.     Left lower leg: No edema.  Skin:    General: Skin is warm and dry.     Coloration: Skin is not jaundiced or pale.     Findings: Bruising (across chest and shoulder from fall) present.  Neurological:     General: No focal deficit present.     Mental Status: He is alert and oriented to person, place, and time. Mental status is at baseline.  Psychiatric:        Mood and Affect: Mood normal.        Behavior: Behavior normal.        Thought Content: Thought content normal.        Judgment: Judgment normal.      Laboratory Data Recent Labs  Lab 10/30/23 1539 10/30/23 2236 10/31/23 0130 10/31/23 0649 10/31/23 0838  WBC 7.1 5.6 5.3 4.5  --   HGB 6.4* 7.2* 7.2* 8.4* 8.4*  HCT 20.1* 22.0* 20.7* 24.2* 24.8*  PLT 119* 108* 93* 83*  --   NEUTOPHILPCT 87  --   --   --   --   LYMPHOPCT 10  --   --   --   --   MONOPCT 1  --   --   --   --   EOSPCT 1  --   --   --   --    Recent Labs  Lab 10/30/23 1539 10/31/23 0649  NA 132* 132*  K 4.9 4.6  CL 103 102  CO2 17* 20*  BUN 43* 46*  CREATININE 2.03* 2.19*  CALCIUM  8.4* 8.4*  PROT 5.3*  --   BILITOT 0.5  --   ALKPHOS 89  --   ALT 34  --   AST 33  --   GLUCOSE 97 81   Recent Labs  Lab 10/30/23 2236  INR 1.2      Imaging Studies: May 2025 CT abdomen pelvis reviewed   Assessment:   # Acute on chronic anemia - initially macrocytic on presentation  - tsh elevated as recently as April; b12 and folate normal this hospitalization;  - iron  studies more consistent with that of chronic disease - may be accompanying  nutritional component - has stabilized since transfusion at ~ 8.4  # generalized weakness 2/2 above  # Thrombocytopenia  # CKD  # hyponatremia  # Phx colon polyps  # H/o PE currently on eliquis  # Recent Pna 4/29-5/13 # CAD # HTN # CHF # ESBL  Plan:  Will continue to attempt prepping patient for EGD and colonoscopy tomorrow to evaluate for GI source of bleeding Provided additional GoLytely  If stools are clear/see through yellow tomorrow morning will be able to proceed Eliquis  currently being held No current signs of gib Hgb has stabilized  Esophagogastroduodenoscopy and Colonoscopy planned for tomorrow pending patient stability and endoscopy suite availability NPO at midnight Clear liquids now Labs in am- bmp, cbc, inr Protonix  40 mg iv q12 h Hold dvt ppx  Monitor H&H.  Transfusion and resuscitation as per primary team Avoid frequent lab draws to prevent lab induced anemia Supportive care and antiemetics as per primary team Maintain two sites IV access Avoid nsaids Monitor for GIB.  Esophagogastroduodenoscopy and Colonoscopy with possible biopsy, control of bleeding, polypectomy, and interventions as necessary has been discussed with the patient/patient representative. Informed consent was obtained from the patient/patient representative after explaining the indication, nature, and risks of the procedure including but not limited to death, bleeding, perforation, missed neoplasm/lesions, cardiorespiratory compromise, and reaction to medications. Opportunity for questions was given and appropriate answers were provided. Patient/patient representative has verbalized understanding is amenable to undergoing the procedure.  Discussed case with nursing  Management of other medical comorbidities as per primary team  I personally performed the service.  Thank you for allowing us  to participate in this patient's care. Please don't hesitate to call if any questions or concerns  arise.   Quintin Buckle, DO [facility]  Portions of the record may have been created with voice recognition software. Occasional wrong-word or 'sound-a-like' substitutions may have occurred due to the inherent limitations of voice recognition software.  Read the chart carefully and recognize, using context, where substitutions may have occurred.

## 2023-11-01 NOTE — H&P (View-Only) (Signed)
 Inpatient Follow-up/Progress Note   Patient ID: Zachary [name] is a 83 y.o. male.  Overnight Events / Subjective Findings Patient completed his initial MiraLAX  prep overnight without bowel movement.  He then started GoLytely  prep had some small bowel movements.  No melena or hematochezia.  Hemoglobin today is 8.4 and has stabilized.  Eliquis  is still being held iron  studies more reflective of chronic disease related anemia. No abdominal pain.  CT abdomen pelvis earlier this month without signs of obstruction.  Severe diverticulosis was noted. Will continue preparation in hopes of performing procedures tomorrow.  Review of Systems  Constitutional:  Negative for activity change, appetite change, chills, diaphoresis, fatigue, fever and unexpected weight change.  HENT:  Negative for trouble swallowing and voice change.   Respiratory:  Negative for shortness of breath and wheezing.   Cardiovascular:  Negative for chest pain, palpitations and leg swelling.  Gastrointestinal:  Positive for constipation. Negative for abdominal distention, abdominal pain, anal bleeding, blood in stool, diarrhea, nausea and vomiting.  Skin:  Negative for color change and pallor.  Neurological:  Negative for dizziness, syncope and weakness.  Psychiatric/Behavioral:  Negative for confusion. The patient is not nervous/anxious.   All other systems reviewed and are negative.    Medications  Current Facility-Administered Medications:    0.9 %  sodium chloride  infusion, 10 mL/hr, Intravenous, Once, Collis Deaner, MD, Held at 10/30/23 1829   acetaminophen  (TYLENOL ) tablet 650 mg, 650 mg, Oral, Q6H PRN, Niu, Xilin, MD   albuterol  (PROVENTIL ) (2.5 MG/3ML) 0.083% nebulizer solution 3 mL, 3 mL, Nebulization, Q4H PRN, Niu, Xilin, MD   ascorbic acid  (VITAMIN C ) tablet 500 mg, 500 mg, Oral, BID, Dezii, Alexandra, DO, 500 mg at 10/31/23 2126   atorvastatin  (LIPITOR) tablet 10 mg, 10 mg, Oral, Daily, Niu, Xilin, MD, 10 mg  at 10/31/23 1037   dextromethorphan -guaiFENesin  (MUCINEX  DM) 30-600 MG per 12 hr tablet 1 tablet, 1 tablet, Oral, BID PRN, Niu, Xilin, MD   feeding supplement (BOOST / RESOURCE BREEZE) liquid 1 Container, 1 Container, Oral, TID BM, Dezii, Alexandra, DO   hydrALAZINE  (APRESOLINE ) injection 5 mg, 5 mg, Intravenous, Q2H PRN, Niu, Xilin, MD   iron  polysaccharides (NIFEREX) capsule 150 mg, 150 mg, Oral, Daily, Niu, Xilin, MD, 150 mg at 10/31/23 1036   midodrine  (PROAMATINE ) tablet 10 mg, 10 mg, Oral, TID WC, Niu, Xilin, MD, 10 mg at 10/31/23 1800   multivitamin with minerals tablet 1 tablet, 1 tablet, Oral, Daily, Niu, Xilin, MD, 1 tablet at 10/31/23 1037   ondansetron  (ZOFRAN ) injection 4 mg, 4 mg, Intravenous, Q8H PRN, Niu, Xilin, MD   pantoprazole  (PROTONIX ) injection 40 mg, 40 mg, Intravenous, Q12H, Niu, Xilin, MD, 40 mg at 10/31/23 2126  sodium chloride  Stopped (10/30/23 1829)    acetaminophen , albuterol , dextromethorphan -guaiFENesin , hydrALAZINE , ondansetron  (ZOFRAN ) IV   Objective    Vitals:   10/31/23 1634 10/31/23 2036 11/01/23 0349 11/01/23 0352  BP: 111/72 118/64  125/82  Pulse: 67 65  64  Resp: 16 16  16   Temp: 97.6 F (36.4 C) 97.6 F (36.4 C)  97.6 F (36.4 C)  TempSrc:  Oral    SpO2: 100% 100%  100%  Weight:   69.7 kg   Height:         Physical Exam Vitals and nursing note reviewed.  Constitutional:      General: He is not in acute distress.    Appearance: He is ill-appearing (chronically). He is not toxic-appearing or diaphoretic.  HENT:  Head: Normocephalic and atraumatic.     Nose: Nose normal.     Mouth/Throat:     Mouth: Mucous membranes are moist.     Pharynx: Oropharynx is clear.  Eyes:     General: No scleral icterus.    Extraocular Movements: Extraocular movements intact.  Cardiovascular:     Rate and Rhythm: Normal rate and regular rhythm.     Heart sounds: Normal heart sounds. No murmur heard.    No friction rub. No gallop.  Pulmonary:      Effort: Pulmonary effort is normal. No respiratory distress.     Breath sounds: Normal breath sounds. No wheezing, rhonchi or rales.  Abdominal:     General: Bowel sounds are normal. There is no distension.     Palpations: Abdomen is soft.     Tenderness: There is no abdominal tenderness. There is no guarding or rebound.  Musculoskeletal:     Cervical back: Neck supple.     Right lower leg: No edema.     Left lower leg: No edema.  Skin:    General: Skin is warm and dry.     Coloration: Skin is not jaundiced or pale.     Findings: Bruising (across chest and shoulder from fall) present.  Neurological:     General: No focal deficit present.     Mental Status: He is alert and oriented to person, place, and time. Mental status is at baseline.  Psychiatric:        Mood and Affect: Mood normal.        Behavior: Behavior normal.        Thought Content: Thought content normal.        Judgment: Judgment normal.      Laboratory Data Recent Labs  Lab 10/30/23 1539 10/30/23 2236 10/31/23 0130 10/31/23 0649 10/31/23 0838  WBC 7.1 5.6 5.3 4.5  --   HGB 6.4* 7.2* 7.2* 8.4* 8.4*  HCT 20.1* 22.0* 20.7* 24.2* 24.8*  PLT 119* 108* 93* 83*  --   NEUTOPHILPCT 87  --   --   --   --   LYMPHOPCT 10  --   --   --   --   MONOPCT 1  --   --   --   --   EOSPCT 1  --   --   --   --    Recent Labs  Lab 10/30/23 1539 10/31/23 0649  NA 132* 132*  K 4.9 4.6  CL 103 102  CO2 17* 20*  BUN 43* 46*  CREATININE 2.03* 2.19*  CALCIUM  8.4* 8.4*  PROT 5.3*  --   BILITOT 0.5  --   ALKPHOS 89  --   ALT 34  --   AST 33  --   GLUCOSE 97 81   Recent Labs  Lab 10/30/23 2236  INR 1.2      Imaging Studies: May 2025 CT abdomen pelvis reviewed   Assessment:   # Acute on chronic anemia - initially macrocytic on presentation  - tsh elevated as recently as April; b12 and folate normal this hospitalization;  - iron  studies more consistent with that of chronic disease - may be accompanying  nutritional component - has stabilized since transfusion at ~ 8.4  # generalized weakness 2/2 above  # Thrombocytopenia  # CKD  # hyponatremia  # Phx colon polyps  # H/o PE currently on eliquis  # Recent Pna 4/29-5/13 # CAD # HTN # CHF # ESBL  Plan:  Will continue to attempt prepping patient for EGD and colonoscopy tomorrow to evaluate for GI source of bleeding Provided additional GoLytely  If stools are clear/see through yellow tomorrow morning will be able to proceed Eliquis  currently being held No current signs of gib Hgb has stabilized  Esophagogastroduodenoscopy and Colonoscopy planned for tomorrow pending patient stability and endoscopy suite availability NPO at midnight Clear liquids now Labs in am- bmp, cbc, inr Protonix  40 mg iv q12 h Hold dvt ppx  Monitor H&H.  Transfusion and resuscitation as per primary team Avoid frequent lab draws to prevent lab induced anemia Supportive care and antiemetics as per primary team Maintain two sites IV access Avoid nsaids Monitor for GIB.  Esophagogastroduodenoscopy and Colonoscopy with possible biopsy, control of bleeding, polypectomy, and interventions as necessary has been discussed with the patient/patient representative. Informed consent was obtained from the patient/patient representative after explaining the indication, nature, and risks of the procedure including but not limited to death, bleeding, perforation, missed neoplasm/lesions, cardiorespiratory compromise, and reaction to medications. Opportunity for questions was given and appropriate answers were provided. Patient/patient representative has verbalized understanding is amenable to undergoing the procedure.  Discussed case with nursing  Management of other medical comorbidities as per primary team  I personally performed the service.  Thank you for allowing us  to participate in this patient's care. Please don't hesitate to call if any questions or concerns  arise.   Quintin Buckle, DO [facility]  Portions of the record may have been created with voice recognition software. Occasional wrong-word or 'sound-a-like' substitutions may have occurred due to the inherent limitations of voice recognition software.  Read the chart carefully and recognize, using context, where substitutions may have occurred.

## 2023-11-01 NOTE — Progress Notes (Signed)
 Dr. Mamie Searles informed and aware of patient finishing bowel prep last night and this AM without any success. Per Dr. Mamie Searles, procedure is on hold, prep will continue throughout the day.

## 2023-11-02 ENCOUNTER — Observation Stay: Payer: Self-pay | Admitting: Anesthesiology

## 2023-11-02 ENCOUNTER — Encounter
Admission: EM | Disposition: A | Discharge status: Nursing Home (Includes ICF) | Payer: Self-pay | Source: Skilled Nursing Facility | Attending: Family Medicine

## 2023-11-02 ENCOUNTER — Encounter: Payer: Self-pay | Admitting: Family Medicine

## 2023-11-02 DIAGNOSIS — K2950 Unspecified chronic gastritis without bleeding: Secondary | ICD-10-CM | POA: Diagnosis not present

## 2023-11-02 DIAGNOSIS — I2585 Chronic coronary microvascular dysfunction: Secondary | ICD-10-CM | POA: Diagnosis not present

## 2023-11-02 DIAGNOSIS — B9681 Helicobacter pylori [H. pylori] as the cause of diseases classified elsewhere: Secondary | ICD-10-CM | POA: Diagnosis not present

## 2023-11-02 DIAGNOSIS — K5730 Diverticulosis of large intestine without perforation or abscess without bleeding: Secondary | ICD-10-CM | POA: Diagnosis not present

## 2023-11-02 DIAGNOSIS — I110 Hypertensive heart disease with heart failure: Secondary | ICD-10-CM | POA: Diagnosis not present

## 2023-11-02 DIAGNOSIS — E785 Hyperlipidemia, unspecified: Secondary | ICD-10-CM | POA: Diagnosis not present

## 2023-11-02 DIAGNOSIS — D500 Iron deficiency anemia secondary to blood loss (chronic): Secondary | ICD-10-CM | POA: Diagnosis not present

## 2023-11-02 DIAGNOSIS — K449 Diaphragmatic hernia without obstruction or gangrene: Secondary | ICD-10-CM | POA: Diagnosis not present

## 2023-11-02 DIAGNOSIS — K222 Esophageal obstruction: Secondary | ICD-10-CM | POA: Diagnosis not present

## 2023-11-02 DIAGNOSIS — I5022 Chronic systolic (congestive) heart failure: Secondary | ICD-10-CM | POA: Diagnosis not present

## 2023-11-02 DIAGNOSIS — I2699 Other pulmonary embolism without acute cor pulmonale: Secondary | ICD-10-CM | POA: Diagnosis not present

## 2023-11-02 DIAGNOSIS — L8990 Pressure ulcer of unspecified site, unspecified stage: Secondary | ICD-10-CM | POA: Diagnosis not present

## 2023-11-02 DIAGNOSIS — D696 Thrombocytopenia, unspecified: Secondary | ICD-10-CM | POA: Diagnosis not present

## 2023-11-02 DIAGNOSIS — K5790 Diverticulosis of intestine, part unspecified, without perforation or abscess without bleeding: Secondary | ICD-10-CM | POA: Diagnosis not present

## 2023-11-02 DIAGNOSIS — K922 Gastrointestinal hemorrhage, unspecified: Secondary | ICD-10-CM | POA: Diagnosis not present

## 2023-11-02 DIAGNOSIS — D649 Anemia, unspecified: Secondary | ICD-10-CM | POA: Diagnosis not present

## 2023-11-02 DIAGNOSIS — K319 Disease of stomach and duodenum, unspecified: Secondary | ICD-10-CM | POA: Diagnosis not present

## 2023-11-02 DIAGNOSIS — D509 Iron deficiency anemia, unspecified: Secondary | ICD-10-CM | POA: Diagnosis not present

## 2023-11-02 DIAGNOSIS — I2510 Atherosclerotic heart disease of native coronary artery without angina pectoris: Secondary | ICD-10-CM | POA: Diagnosis not present

## 2023-11-02 HISTORY — PX: ESOPHAGOGASTRODUODENOSCOPY: SHX5428

## 2023-11-02 HISTORY — PX: COLONOSCOPY: SHX5424

## 2023-11-02 LAB — CBC WITH DIFFERENTIAL/PLATELET
Abs Immature Granulocytes: 0.03 10*3/uL (ref 0.00–0.07)
Basophils Absolute: 0 10*3/uL (ref 0.0–0.1)
Basophils Relative: 1 %
Eosinophils Absolute: 0.1 10*3/uL (ref 0.0–0.5)
Eosinophils Relative: 2 %
HCT: 26.2 % — ABNORMAL LOW (ref 39.0–52.0)
Hemoglobin: 9 g/dL — ABNORMAL LOW (ref 13.0–17.0)
Immature Granulocytes: 1 %
Lymphocytes Relative: 9 %
Lymphs Abs: 0.5 10*3/uL — ABNORMAL LOW (ref 0.7–4.0)
MCH: 32 pg (ref 26.0–34.0)
MCHC: 34.4 g/dL (ref 30.0–36.0)
MCV: 93.2 fL (ref 80.0–100.0)
Monocytes Absolute: 0.1 10*3/uL (ref 0.1–1.0)
Monocytes Relative: 2 %
Neutro Abs: 5 10*3/uL (ref 1.7–7.7)
Neutrophils Relative %: 85 %
Platelets: 78 10*3/uL — ABNORMAL LOW (ref 150–400)
RBC: 2.81 MIL/uL — ABNORMAL LOW (ref 4.22–5.81)
RDW: 14.7 % (ref 11.5–15.5)
WBC: 5.9 10*3/uL (ref 4.0–10.5)
nRBC: 0 % (ref 0.0–0.2)

## 2023-11-02 LAB — COMPREHENSIVE METABOLIC PANEL WITH GFR
ALT: 31 U/L (ref 0–44)
AST: 29 U/L (ref 15–41)
Albumin: 2.3 g/dL — ABNORMAL LOW (ref 3.5–5.0)
Alkaline Phosphatase: 81 U/L (ref 38–126)
Anion gap: 3 — ABNORMAL LOW (ref 5–15)
BUN: 35 mg/dL — ABNORMAL HIGH (ref 8–23)
CO2: 26 mmol/L (ref 22–32)
Calcium: 8.1 mg/dL — ABNORMAL LOW (ref 8.9–10.3)
Chloride: 100 mmol/L (ref 98–111)
Creatinine, Ser: 1.79 mg/dL — ABNORMAL HIGH (ref 0.61–1.24)
GFR, Estimated: 38 mL/min — ABNORMAL LOW (ref >60)
Glucose, Bld: 79 mg/dL (ref 70–99)
Potassium: 4.3 mmol/L (ref 3.5–5.1)
Sodium: 129 mmol/L — ABNORMAL LOW (ref 135–145)
Total Bilirubin: 0.9 mg/dL (ref 0.0–1.2)
Total Protein: 5.1 g/dL — ABNORMAL LOW (ref 6.5–8.1)

## 2023-11-02 LAB — MAGNESIUM: Magnesium: 1.9 mg/dL (ref 1.7–2.4)

## 2023-11-02 LAB — PROTIME-INR
INR: 1 (ref 0.8–1.2)
Prothrombin Time: 13.8 s (ref 11.4–15.2)

## 2023-11-02 LAB — PHOSPHORUS: Phosphorus: 2.4 mg/dL — ABNORMAL LOW (ref 2.5–4.6)

## 2023-11-02 SURGERY — EGD (ESOPHAGOGASTRODUODENOSCOPY)
Anesthesia: General

## 2023-11-02 MED ORDER — PHENYLEPHRINE HCL (PRESSORS) 10 MG/ML IV SOLN
INTRAVENOUS | Status: DC | PRN
  Administered 2023-11-02: 80 ug via INTRAVENOUS
  Administered 2023-11-02: 160 ug via INTRAVENOUS

## 2023-11-02 MED ORDER — PROPOFOL 500 MG/50ML IV EMUL
INTRAVENOUS | Status: DC | PRN
  Administered 2023-11-02: 50 ug/kg/min via INTRAVENOUS

## 2023-11-02 MED ORDER — PROPOFOL 10 MG/ML IV BOLUS
INTRAVENOUS | Status: AC
  Filled 2023-11-02: qty 20

## 2023-11-02 MED ORDER — EPHEDRINE SULFATE (PRESSORS) 50 MG/ML IJ SOLN
INTRAMUSCULAR | Status: DC | PRN
  Administered 2023-11-02: 10 mg via INTRAVENOUS

## 2023-11-02 MED ORDER — MIDAZOLAM HCL 5 MG/5ML IJ SOLN
INTRAMUSCULAR | Status: DC | PRN
  Administered 2023-11-02 (×2): 1 mg via INTRAVENOUS

## 2023-11-02 MED ORDER — LIDOCAINE HCL (PF) 4 % IJ SOLN
INTRAMUSCULAR | Status: DC | PRN
  Administered 2023-11-02: 5 mL

## 2023-11-02 MED ORDER — MIDAZOLAM HCL 2 MG/2ML IJ SOLN
INTRAMUSCULAR | Status: AC
  Filled 2023-11-02: qty 2

## 2023-11-02 NOTE — TOC Progression Note (Signed)
 Transition of Care [facility]) - Progression Note    Patient Details  Name: NATALE THOMA MRN: [id number] Date of Birth: April 01, 1941  Transition of [facility]) CM/SW Contact  Loman Risk, RN Phone Number: 11/02/2023, 10:43 AM  Clinical Narrative:     MD aware that patient will require PT OT eval when appropriate in order to obtain auth to return to SNF  Expected Discharge Plan: Skilled Nursing Facility Barriers to Discharge: Continued Medical Work up  Expected Discharge Plan and Services     Post Acute Care Choice: Resumption of Svcs/PTA Provider Living arrangements for the past 2 months: Single Family Home, Skilled Nursing Facility                                       Social Determinants of Health (SDOH) Interventions SDOH Screenings   Food Insecurity: No Food Insecurity (10/31/2023)  Housing: Low Risk  (10/31/2023)  Transportation Needs: No Transportation Needs (10/31/2023)  Utilities: Not At Risk (10/31/2023)  Alcohol  Screen: Low Risk  (09/25/2021)  Depression (PHQ2-9): Low Risk  (09/26/2023)  Financial Resource Strain: Low Risk  (09/25/2021)  Physical Activity: Insufficiently Active (09/25/2021)  Social Connections: Moderately Isolated (10/31/2023)  Stress: No Stress Concern Present (11/24/2019)  Tobacco Use: Low Risk  (11/02/2023)    Readmission Risk Interventions    10/01/2023    2:47 PM  Readmission Risk Prevention Plan  PCP or Specialist Appt within 3-5 Days Complete  Social Work Consult for Recovery Care Planning/Counseling Complete  Palliative Care Screening Not Applicable

## 2023-11-02 NOTE — Interval H&P Note (Signed)
 History and Physical Interval Note: Progress Note from 11/01/2023 was reviewed and there was no interval change after seeing and examining the patient.  I have reviewed his labs from this morning as well and hemoglobin is 9 INR 1.0 BUN 35 creatinine 1.8.  Vital signs stable.  Written consent was obtained from the patient after discussion of risks, benefits, and alternatives. Patient has consented to proceed with Esophagogastroduodenoscopy and Colonoscopy with possible intervention  Esophagogastroduodenoscopy and colonoscopy with possible biopsy, control of bleeding, polypectomy, and interventions as necessary has been discussed with the patient/patient representative. Informed consent was obtained from the patient/patient representative after explaining the indication, nature, and risks of the procedure including but not limited to death, bleeding, perforation, missed neoplasm/lesions, cardiorespiratory compromise, and reaction to medications. Opportunity for questions was given and appropriate answers were provided. Patient/patient representative has verbalized understanding is amenable to undergoing the procedure.    11/02/2023 7:44 AM  Zachary [name]  has presented today for surgery, with the diagnosis of Anemia.  The various methods of treatment have been discussed with the patient and family. After consideration of risks, benefits and other options for treatment, the patient has consented to  Procedure(s): EGD (ESOPHAGOGASTRODUODENOSCOPY) (N/A) COLONOSCOPY (N/A) as a surgical intervention.  The patient's history has been reviewed, patient examined, no change in status, stable for surgery.  I have reviewed the patient's chart and labs.  Questions were answered to the patient's satisfaction.     Quintin Buckle

## 2023-11-02 NOTE — Anesthesia Postprocedure Evaluation (Signed)
 Anesthesia Post Note  Patient: Zachary [name]  Procedure(s) Performed: EGD (ESOPHAGOGASTRODUODENOSCOPY) COLONOSCOPY  Patient location during evaluation: Endoscopy Anesthesia Type: General Level of consciousness: awake and alert Pain management: pain level controlled Vital Signs Assessment: post-procedure vital signs reviewed and stable Respiratory status: spontaneous breathing, nonlabored ventilation, respiratory function stable and patient connected to nasal cannula oxygen  Cardiovascular status: blood pressure returned to baseline and stable Postop Assessment: no apparent nausea or vomiting Anesthetic complications: no   No notable events documented.   Last Vitals:  Vitals:   11/02/23 0849 11/02/23 0857  BP: (!) 92/58 (!) 93/59  Pulse: 69 75  Resp: 12 10  Temp:    SpO2: 100% 100%    Last Pain:  Vitals:   11/02/23 0857  TempSrc:   PainSc: Asleep                 Lattie Poli

## 2023-11-02 NOTE — Plan of Care (Signed)

## 2023-11-02 NOTE — Op Note (Signed)
 [facility] Patient Name: Zachary [name] Procedure Date: 11/02/2023 7:13 AM MRN: [id number] Account #: [id number] Date of Birth: December 31, 1940 Admit Type: Inpatient Age: 83 Room: [facility] ENDO ROOM 4 Gender: Male Note Status: Finalized Instrument Name: Peds Colonoscope [id number] Procedure:             Colonoscopy Indications:           Iron  deficiency anemia secondary to chronic blood loss Providers:             Bridgett Camps, DO Referring MD:          Aldo Amble. Oletta Berry, MD (Referring MD) Medicines:             Monitored Anesthesia Care Complications:         No immediate complications. Estimated blood loss: None. Procedure:             Pre-Anesthesia Assessment:                        - Prior to the procedure, a History and Physical was                         performed, and patient medications and allergies were                         reviewed. The patient is competent. The risks and                         benefits of the procedure and the sedation options and                         risks were discussed with the patient. All questions                         were answered and informed consent was obtained.                         Patient identification and proposed procedure were                         verified by the physician, the nurse, the anesthetist                         and the technician in the endoscopy suite. Mental                         Status Examination: alert and oriented. Airway                         Examination: normal oropharyngeal airway and neck                         mobility. Respiratory Examination: clear to                         auscultation. CV Examination: RRR, no murmurs, no S3                         or S4. Prophylactic Antibiotics: The patient does not  require prophylactic antibiotics. Prior                         Anticoagulants: The patient has taken Eliquis                           (apixaban ), last dose was 3 days prior to procedure.                         ASA Grade Assessment: III - A patient with severe                         systemic disease. After reviewing the risks and                         benefits, the patient was deemed in satisfactory                         condition to undergo the procedure. The anesthesia                         plan was to use monitored anesthesia care (MAC).                         Immediately prior to administration of medications,                         the patient was re-assessed for adequacy to receive                         sedatives. The heart rate, respiratory rate, oxygen                          saturations, blood pressure, adequacy of pulmonary                         ventilation, and response to care were monitored                         throughout the procedure. The physical status of the                         patient was re-assessed after the procedure.                        After obtaining informed consent, the colonoscope was                         passed under direct vision. Throughout the procedure,                         the patient's blood pressure, pulse, and oxygen                          saturations were monitored continuously. The                         Colonoscope was introduced through the anus and  advanced to the the cecum, identified by appendiceal                         orifice and ileocecal valve. The colonoscopy was                         performed without difficulty. The patient tolerated                         the procedure well. The quality of the bowel                         preparation was poor. The ileocecal valve, appendiceal                         orifice, and rectum were photographed. Findings:      The perianal and digital rectal examinations were normal. Pertinent       negatives include normal sphincter tone.      Multiple small-mouthed diverticula  were found in the left colon.       Estimated blood loss: none.      The lumen of the transverse colon and ascending colon was moderately       dilated. Estimated blood loss: none.      A moderate amount of stool was found in the entire colon, interfering       with visualization. Lavage of the area was performed using a large       amount, resulting in clearance with fair visualization. Estimated blood       loss: none.      No signs of fresh or old blood throughout the colon. Estimated blood       loss: none. Impression:            - Preparation of the colon was poor.                        - Diverticulosis in the left colon.                        - Dilated in the transverse colon and in the ascending                         colon.                        - Stool in the entire examined colon.                        - No specimens collected. Recommendation:        - Patient has a contact number available for                         emergencies. The signs and symptoms of potential                         delayed complications were discussed with the patient.                         Return to normal activities tomorrow. Written  discharge instructions were provided to the patient.                        - Return patient to hospital ward for ongoing care.                        - Advance diet as tolerated.                        - Continue present medications.                        - The findings and recommendations were discussed with                         the patient.                        - The findings and recommendations were discussed with                         the patient's family. Procedure Code(s):     --- Professional ---                        [id number], Colonoscopy, flexible; diagnostic, including                         collection of specimen(s) by brushing or washing, when                         performed (separate procedure) Diagnosis Code(s):      --- Professional ---                        K59.39, Other megacolon                        D50.0, Iron  deficiency anemia secondary to blood loss                         (chronic)                        K57.30, Diverticulosis of large intestine without                         perforation or abscess without bleeding CPT copyright 2022 American Medical Association. All rights reserved. The codes documented in this report are preliminary and upon coder review may  be revised to meet current compliance requirements. Attending Participation:      I personally performed the entire procedure. Polo Brisk, DO Quintin Buckle DO, DO 11/02/2023 8:44:24 AM This report has been signed electronically. Number of Addenda: 0 Note Initiated On: 11/02/2023 7:13 AM Scope Withdrawal Time: 0 hours 5 minutes 55 seconds  Total Procedure Duration: 0 hours 14 minutes 53 seconds  Estimated Blood Loss:  Estimated blood loss: none.      [facility]

## 2023-11-02 NOTE — Progress Notes (Signed)
 Upon rounding, patient found asleep covered in dark brown vomit. The patient was easily arousable and stated that he did not recall vomiting. He denied any SOB, difficulty breathing, or nausea. Lung sounds were clear, O2 sats remained at 99% on room air. He denied any other s/s. The provider was immediately notified, will continue to monitor patient.

## 2023-11-02 NOTE — Anesthesia Preprocedure Evaluation (Signed)
 Anesthesia Evaluation  Patient identified by MRN, date of birth, ID band Patient awake  General Assessment Comment:  Patient AO x 3, on room air, no acute distress.  Reviewed: Allergy & Precautions, NPO status , Patient's Chart, lab work & pertinent test results  History of Anesthesia Complications Negative for: history of anesthetic complications  Airway Mallampati: III  TM Distance: >3 FB Neck ROM: Full    Dental  (+) Chipped   Pulmonary neg sleep apnea, pneumonia, resolved, neg COPD, Patient abstained from smoking.Not current smoker Pneumonia last month, s/p recent completion of linezolid    Pulmonary exam normal breath sounds clear to auscultation       Cardiovascular Exercise Tolerance: Poor METS+ Past MI and +CHF  (-) CAD (-) dysrhythmias + Valvular Problems/Murmurs AS and AI  Rhythm:Regular Rate:Normal - Systolic murmurs Chronic hypotension on midodrine .  TTE 2025:  1. Left ventricular ejection fraction, by estimation, is 35 to 40%. The  left ventricle has moderately decreased function. The left ventricle  demonstrates global hypokinesis. There is mild left ventricular  hypertrophy. Left ventricular diastolic  parameters are indeterminate.   2. Right ventricular systolic function is normal. The right ventricular  size is normal.   3. The mitral valve is normal in structure. Mild mitral valve  regurgitation.   4. Low flow low gradient, mild to moderate aortic stenosis. The aortic  valve is tricuspid. Aortic valve regurgitation is mild to moderate.   5. There is mild dilatation of the aortic root, measuring 42 mm. There is  mild dilatation of the ascending aorta, measuring 41 mm.     Neuro/Psych negative neurological ROS  negative psych ROS   GI/Hepatic ,neg GERD  ,,(+)     (-) substance abuse    Endo/Other  neg diabetes    Renal/GU CRFRenal disease     Musculoskeletal   Abdominal   Peds  Hematology    Anesthesia Other Findings Past Medical History: 08/03/2019: B12 deficiency anemia No date: Hypercholesterolemia No date: Hypertension No date: Vertigo     Comment:  last episode approx 09/2018  Reproductive/Obstetrics                             Anesthesia Physical Anesthesia Plan  ASA: 3  Anesthesia Plan: MAC   Post-op Pain Management: Minimal or no pain anticipated   Induction: Intravenous  PONV Risk Score and Plan: 1 and Propofol  infusion, TIVA, Ondansetron , Midazolam  and Treatment may vary due to age or medical condition  Airway Management Planned: Nasal Cannula and Natural Airway  Additional Equipment: None  Intra-op Plan:   Post-operative Plan:   Informed Consent: I have reviewed the patients History and Physical, chart, labs and discussed the procedure including the risks, benefits and alternatives for the proposed anesthesia with the patient or authorized representative who has indicated his/her understanding and acceptance.     Dental advisory given  Plan Discussed with: CRNA and Surgeon  Anesthesia Plan Comments: (Discussed with patient our plan to topicalize his oropharynx with lidocaine  and to use, at most, moderate sedation, due to his chronic hypotension, HFrEF with valvular issues, and anemia, which puts him at higher risk for cardiovascular issues under anesthesia. Discussed risks of anesthesia with patient, including possibility of difficulty with spontaneous ventilation under anesthesia necessitating airway intervention, PONV, and rare risks such as cardiac or respiratory or neurological events, and allergic reactions. Discussed the role of CRNA in patient's perioperative care. Patient understands.)  Anesthesia Quick Evaluation

## 2023-11-02 NOTE — Progress Notes (Signed)
 PROGRESS NOTE    Zachary [name]  [name] DOB: [birth year] DOA: 10/30/2023 PCP: [name], MD  Chief Complaint  Patient presents with   Weakness    Hospital Course:  This is an 82 year old male with hypotension on midodrine , hyperlipidemia, CAD, systolic CHF, CKD stage IV, anemia of chronic disease, history of pulmonary embolism on Eliquis , ESBL UTI, status post suprapubic catheter placement, and recent MRSA bacteremia secondary to cavitary pneumonia currently on linezolid .  He presents with weakness and shortness of breath from his skilled nursing facility.  Patient was just hospitalized 4/29 through 5/13 with MRSA bacteremia secondary to cavitary pneumonia.  He was discharged on linezolid  with plans to continue medication from 5/13 through 5/30.  He is following outpatient with infectious disease. Patient has been short of breath, with occasional cough but no chest pain.  He denies any dark stools or rectal bleeding.  He had labs at the skilled nursing facility which revealed a hemoglobin of 6.8.  In the ED hemoglobin found to be 6.4.  He was hemodynamically stable and other labs unremarkable.  Gastroenterology was consulted.  Subjective: Patient was evaluated after endoscopy.  He is very drowsy.  He has no acute complaints at this time.  That RN reports later in the morning patient had brown vomitus.  Remains 99% on room air.  Denies any abdominal pain or nausea now.  Objective: Vitals:   11/02/23 0857 11/02/23 1000 11/02/23 1220 11/02/23 1400  BP: (!) 93/59 95/73 (!) 85/70 (!) 89/52  Pulse: 75 65 69 67  Resp: 10 12 14 16   Temp:  (!) 97.1 F (36.2 C)  (!) 97.4 F (36.3 C)  TempSrc:  Oral  Axillary  SpO2: 100% 99% 99% 100%  Weight:      Height:        Intake/Output Summary (Last 24 hours) at 11/02/2023 1536 Last data filed at 11/02/2023 1300 Gross per 24 hour  Intake 700 ml  Output 500 ml  Net 200 ml   Filed Weights   10/30/23 1840 11/01/23 0349 11/02/23  0344  Weight: 56.7 kg 69.7 kg 73 kg    Examination: General exam: Appears calm and comfortable, NAD, frail, weak Respiratory system: No work of breathing, symmetric chest wall expansion Cardiovascular system: S1 & S2 heard, RRR.  Gastrointestinal system: Abdomen is nondistended, soft and nontender.  Neuro: Drowsy but arousable. No focal neurological deficits. Extremities: Symmetric, expected ROM Skin: Extremities covered in bruises in multiple stages of healing, some oozing wounds, Psychiatry: Demonstrates appropriate judgement and insight. Mood & affect appropriate for situation.      Assessment & Plan:  Principal Problem:   Symptomatic anemia Active Problems:   Iron  deficiency anemia   Chronic systolic CHF (congestive heart failure) (HCC)   Thrombocytopenia (HCC)   Cavitary pneumonia   Pulmonary embolism (HCC)   CAD (coronary artery disease)   HLD (hyperlipidemia)   CKD (chronic kidney disease) stage 4, GFR 15-29 ml/min (HCC)   Protein-calorie malnutrition, severe   GI bleed   Pressure injury of skin    Acute on chronic anemia History of iron  deficiency anemia  Anemia of chronic disease - Baseline hemoglobin appears to be 8.0, has dropped to 6.4 - Patient is taking Eliquis , have held - EGD and colonoscopy 6/1: Upper endoscopy revealed hiatal hernia and gastropathy, no active bleeding.  Schatzki's ring appreciated.  Colonoscopy reveals stool burden, no fresh blood or old blood.  Colon lumen dilated consistent with chronic constipation - For now we will continue  with PPI twice daily x 8 weeks - Will resume Eliquis  tomorrow - Continue to avoid NSAIDs - Status post 2 units PRBC, hemoglobin has risen expectedly and is remaining stable at this time - Continue to trend CBC - Anemia panel: elevated B12, elevated ferritin, elevated iron  levels and TSAT.  Appears to all be secondary to anemia of chronic disease.  No real evidence of iron  deficiency.    Chronic systolic  congestive heart failure - Echo 10/12/2023: EF 35 to 40% -Increasing lower extremity edema - Status post IV Lasix  x 1, will schedule low-dose Lasix  for now - Continue to monitor volume status closely.  Remained stable on room air  Thrombocytopenia - Complicated by symptomatic acute anemia as above - May also be secondary to prolonged course of linezolid  which is now discontinued - Normal platelet morphology on smear review - Patient continues to have significant bruising covering most of his body - LDH within normal limits - Trend CBC closely - Platelets appear to be stabilizing for now - Will consider transfusion if they continue to drop - Hold all anticoagulation  Recent cavitary pneumonia Recent MRSA bacteremia - Has been on extended course of Zyvox .  Course completed 5/30. - Currently afebrile without leukocytosis.  No acute concern for infection.  Pulmonary embolism - Meant to be on Eliquis , holding for now given anemia and thrombocytopenia. - Consider resuming tomorrow if platelet function and hemoglobin stable  CAD - Hold aspirin  - Continue Lipitor  Hyperlipidemia - Continue statin  CKD stage IV - Baseline creatinine 2.1 -Currently improved beyond baseline - Renally dosed with a creatinine clearance of 34 - Avoid nephrotoxic medications  Protein calorie malnutrition, severe BMI 16 - Nutrition consult  Hyponatremia - Chronic.  Appears close to baseline currently - AM BMP  Poor prognosis - Given patient's advanced age, severe protein calorie malnutrition, chronic weight loss, thrombocytopenia, pulmonary embolism on Eliquis , recent bacteremia, and now inability to tolerate anticoagulation he has an overall poor prognosis.  I discussed his care directly with his wife, Zachary [name], on the phone today.  At this time shed like us  to continue to do everything we can.  I did offer her palliative care and hospice in the event that he changes his mind and would like to change his  focus of treatment.  She endorses understanding.  Will continue to touch base throughout this admission.  DVT prophylaxis: SCDs, avoid chemical anticoagulation given acute anemia   Code Status: Limited: Do not attempt resuscitation (DNR) -DNR-LIMITED -Do Not Intubate/DNI  Disposition: Still observation.  Still very deconditioned.  Monitoring platelet counts and hemoglobin levels, thrombocytopenia appears to be worsening.  Cannot yet tolerate necessary anticoagulation  Consultants:  Treatment Team:  Consulting Physician: Marnee Sink, MD  Procedures:    Antimicrobials:  Anti-infectives (From admission, onward)    Start     Dose/Rate Route Frequency Ordered Stop   10/30/23 2315  linezolid  (ZYVOX ) tablet 600 mg        600 mg Oral Every 12 hours 10/30/23 2217 10/31/23 1036       Data Reviewed: I have personally reviewed following labs and imaging studies CBC: Recent Labs  Lab 10/30/23 1539 10/30/23 2236 10/31/23 0130 10/31/23 0649 10/31/23 0838 11/02/23 0654  WBC 7.1 5.6 5.3 4.5  --  5.9  NEUTROABS 6.1  --   --   --   --  5.0  HGB 6.4* 7.2* 7.2* 8.4* 8.4* 9.0*  HCT 20.1* 22.0* 20.7* 24.2* 24.8* 26.2*  MCV 102.6* 96.1 94.1  93.1  --  93.2  PLT 119* 108* 93* 83*  --  78*   Basic Metabolic Panel: Recent Labs  Lab 10/30/23 1539 10/31/23 0649 11/01/23 0641 11/02/23 0654  NA 132* 132* 130* 129*  K 4.9 4.6 4.6 4.3  CL 103 102 99 100  CO2 17* 20* 21* 26  GLUCOSE 97 81 79 79  BUN 43* 46* 39* 35*  CREATININE 2.03* 2.19* 1.86* 1.79*  CALCIUM  8.4* 8.4* 8.1* 8.1*  MG  --   --  2.0 1.9  PHOS  --   --  2.5 2.4*   GFR: Estimated Creatinine Clearance: 34 mL/min (A) (by C-G formula based on SCr of 1.79 mg/dL (H)). Liver Function Tests: Recent Labs  Lab 10/30/23 1539 11/02/23 0654  AST 33 29  ALT 34 31  ALKPHOS 89 81  BILITOT 0.5 0.9  PROT 5.3* 5.1*  ALBUMIN 2.4* 2.3*   CBG: No results for input(s): "GLUCAP" in the last 168 hours.  No results found for this or any  previous visit (from the past 240 hours).   Radiology Studies: No results found.  Scheduled Meds:  vitamin C   500 mg Oral BID   atorvastatin   10 mg Oral Daily   feeding supplement  1 Container Oral TID BM   iron  polysaccharides  150 mg Oral Daily   midodrine   10 mg Oral TID WC   multivitamin with minerals  1 tablet Oral Daily   pantoprazole  (PROTONIX ) IV  40 mg Intravenous Q12H   Continuous Infusions:     LOS: 1 day  MDM: Patient is high risk for one or more organ failure.  They necessitate ongoing hospitalization for continued IV therapies and subsequent lab monitoring. Total time spent interpreting labs and vitals, reviewing the medical record, coordinating care amongst consultants and care team members, directly assessing and discussing care with the patient and/or family: 55 min  Zachary Payer, DO [facility]  To contact the attending physician between 7A-7P please use Epic Chat. To contact the covering physician during after hours 7P-7A, please review Amion.  11/02/2023, 3:36 PM   *This document has been created with the assistance of dictation software. Please excuse typographical errors. *

## 2023-11-02 NOTE — Op Note (Signed)
 [facility] Patient Name: Zachary [name] Procedure Date: 11/02/2023 7:32 AM MRN: [id number] Account #: [id number] Date of Birth: 1941/05/29 Admit Type: Inpatient Age: 83 Room: [facility] ENDO ROOM 4 Gender: Male Note Status: Finalized Instrument Name: Upper Endoscope [id number] Procedure:             Upper GI endoscopy Indications:           Iron  deficiency anemia secondary to chronic blood loss Providers:             Bridgett Camps, DO Referring MD:          Aldo Amble. Oletta Berry, MD (Referring MD) Medicines:             Monitored Anesthesia Care Complications:         No immediate complications. Estimated blood loss:                         Minimal. Procedure:             Pre-Anesthesia Assessment:                        - Prior to the procedure, a History and Physical was                         performed, and patient medications and allergies were                         reviewed. The patient is competent. The risks and                         benefits of the procedure and the sedation options and                         risks were discussed with the patient. All questions                         were answered and informed consent was obtained.                         Patient identification and proposed procedure were                         verified by the physician, the nurse, the                         anesthesiologist, the anesthetist and the technician                         in the endoscopy suite. Mental Status Examination:                         alert and oriented. Airway Examination: normal                         oropharyngeal airway and neck mobility. Respiratory                         Examination: clear to auscultation. CV Examination:  RRR, no murmurs, no S3 or S4. Prophylactic                         Antibiotics: The patient does not require prophylactic                         antibiotics. Prior  Anticoagulants: The patient has                         taken Eliquis  (apixaban ), last dose was 3 days prior                         to procedure. ASA Grade Assessment: III - A patient                         with severe systemic disease. After reviewing the                         risks and benefits, the patient was deemed in                         satisfactory condition to undergo the procedure. The                         anesthesia plan was to use monitored anesthesia care                         (MAC). Immediately prior to administration of                         medications, the patient was re-assessed for adequacy                         to receive sedatives. The heart rate, respiratory                         rate, oxygen  saturations, blood pressure, adequacy of                         pulmonary ventilation, and response to care were                         monitored throughout the procedure. The physical                         status of the patient was re-assessed after the                         procedure.                        After obtaining informed consent, the endoscope was                         passed under direct vision. Throughout the procedure,                         the patient's blood pressure, pulse, and oxygen   saturations were monitored continuously. The Endoscope                         was introduced through the mouth, and advanced to the                         third part of duodenum. The upper GI endoscopy was                         accomplished without difficulty. The patient tolerated                         the procedure well. Findings:      The duodenal bulb, first portion of the duodenum, second portion of the       duodenum and third portion of the duodenum were normal. Estimated blood       loss: none.      A large hiatal hernia was present. Estimated blood loss: none.      A few localized erosions with stigmata of recent  bleeding were found in       the gastric body. Biopsies were taken with a cold forceps for histology.       Estimated blood loss was minimal.      The exam of the stomach was otherwise normal.      The Z-line was regular. Estimated blood loss: none.      A widely patent Schatzki ring was found in the lower third of the       esophagus. Estimated blood loss: none.      Esophagogastric landmarks were identified: the gastroesophageal junction       was found at 40 cm from the incisors.      The exam of the esophagus was otherwise normal. Impression:            - Normal duodenal bulb, first portion of the duodenum,                         second portion of the duodenum and third portion of                         the duodenum.                        - Large hiatal hernia.                        - Erosive gastropathy with stigmata of recent                         bleeding. Biopsied.                        - Z-line regular.                        - Widely patent Schatzki ring.                        - Esophagogastric landmarks identified. Recommendation:        - Return patient to hospital ward for ongoing care.                        -  Resume previous diet.                        - Continue present medications.                        - Use Protonix  (pantoprazole ) 40 mg PO BID for 8 weeks.                        - Await pathology results.                        - Proceed with colonoscopy. see report for further                         recommendations.                        - The findings and recommendations were discussed with                         the patient.                        - The findings and recommendations were discussed with                         the patient's family. Procedure Code(s):     --- Professional ---                        [phone], Esophagogastroduodenoscopy, flexible,                         transoral; with biopsy, single or multiple Diagnosis Code(s):     ---  Professional ---                        K44.9, Diaphragmatic hernia without obstruction or                         gangrene                        K92.2, Gastrointestinal hemorrhage, unspecified                        K22.2, Esophageal obstruction                        D50.0, Iron  deficiency anemia secondary to blood loss                         (chronic) CPT copyright 2022 American Medical Association. All rights reserved. The codes documented in this report are preliminary and upon coder review may  be revised to meet current compliance requirements. Attending Participation:      I personally performed the entire procedure. Polo Brisk, DO Quintin Buckle DO, DO 11/02/2023 8:11:01 AM This report has been signed electronically. Number of Addenda: 0 Note Initiated On: 11/02/2023 7:32 AM Estimated Blood Loss:  Estimated blood loss was minimal.      [facility]

## 2023-11-02 NOTE — Transfer of Care (Signed)
 Immediate Anesthesia Transfer of Care Note  Patient: Zachary [name]  Procedure(s) Performed: EGD (ESOPHAGOGASTRODUODENOSCOPY) COLONOSCOPY  Patient Location: Endoscopy Unit  Anesthesia Type:General  Level of Consciousness: drowsy  Airway & Oxygen  Therapy: Patient Spontanous Breathing  Post-op Assessment: Report given to RN and Post -op Vital signs reviewed and stable  Post vital signs: Reviewed  Last Vitals:  Vitals Value Taken Time  BP 82/56 11/02/23 0836  Temp 35.4 C 11/02/23 0836  Pulse 94 11/02/23 0836  Resp 19 11/02/23 0836  SpO2 99 % 11/02/23 0836    Last Pain:  Vitals:   11/02/23 0836  TempSrc: Temporal  PainSc: Asleep         Complications: No notable events documented.

## 2023-11-02 NOTE — Care Plan (Signed)
 Brief GI Post Op Note  Patient for endoscopy and colonoscopy. Please see reports for details Upper endoscopy revealing hiatal hernia and gastropathy.  Appears to may have been symptoms and from here previously, but no active bleeding at time of exam.  Schatzki's ring was also appreciated.  Otherwise EGD unremarkable Colonoscopy still had a fair amount of stool limiting visualization, however, all stool was brown/green.  No signs of fresh blood or old blood throughout the colon.  Colon lumen was dilated consistent with constipation.  Discussed case with hospitalist Recommend PPI twice daily for 8 weeks Can resume Eliquis  today or tomorrow Advance diet as tolerated Avoid NSAIDs I spoke with his wife over the phone and informed her of the results of both exams  GI to sign off. Available as needed. Please do not hesitate to call regarding questions or concerns.  Jorje Newton, DO [facility]

## 2023-11-03 ENCOUNTER — Ambulatory Visit: Payer: Self-pay

## 2023-11-03 ENCOUNTER — Encounter: Payer: Self-pay | Admitting: Gastroenterology

## 2023-11-03 DIAGNOSIS — E785 Hyperlipidemia, unspecified: Secondary | ICD-10-CM | POA: Diagnosis not present

## 2023-11-03 DIAGNOSIS — I2699 Other pulmonary embolism without acute cor pulmonale: Secondary | ICD-10-CM | POA: Diagnosis not present

## 2023-11-03 DIAGNOSIS — E43 Unspecified severe protein-calorie malnutrition: Secondary | ICD-10-CM | POA: Diagnosis not present

## 2023-11-03 DIAGNOSIS — D696 Thrombocytopenia, unspecified: Secondary | ICD-10-CM | POA: Diagnosis not present

## 2023-11-03 DIAGNOSIS — I2585 Chronic coronary microvascular dysfunction: Secondary | ICD-10-CM | POA: Diagnosis not present

## 2023-11-03 DIAGNOSIS — I5022 Chronic systolic (congestive) heart failure: Secondary | ICD-10-CM | POA: Diagnosis not present

## 2023-11-03 DIAGNOSIS — D509 Iron deficiency anemia, unspecified: Secondary | ICD-10-CM | POA: Diagnosis not present

## 2023-11-03 DIAGNOSIS — D649 Anemia, unspecified: Secondary | ICD-10-CM | POA: Diagnosis not present

## 2023-11-03 DIAGNOSIS — L8990 Pressure ulcer of unspecified site, unspecified stage: Secondary | ICD-10-CM | POA: Diagnosis not present

## 2023-11-03 DIAGNOSIS — N184 Chronic kidney disease, stage 4 (severe): Secondary | ICD-10-CM | POA: Diagnosis not present

## 2023-11-03 DIAGNOSIS — K922 Gastrointestinal hemorrhage, unspecified: Secondary | ICD-10-CM | POA: Diagnosis not present

## 2023-11-03 LAB — TSH: TSH: 45 u[IU]/mL — ABNORMAL HIGH (ref 0.350–4.500)

## 2023-11-03 LAB — COMPREHENSIVE METABOLIC PANEL WITH GFR
ALT: 28 U/L (ref 0–44)
AST: 23 U/L (ref 15–41)
Albumin: 2.2 g/dL — ABNORMAL LOW (ref 3.5–5.0)
Alkaline Phosphatase: 78 U/L (ref 38–126)
Anion gap: 5 (ref 5–15)
BUN: 36 mg/dL — ABNORMAL HIGH (ref 8–23)
CO2: 24 mmol/L (ref 22–32)
Calcium: 8.2 mg/dL — ABNORMAL LOW (ref 8.9–10.3)
Chloride: 104 mmol/L (ref 98–111)
Creatinine, Ser: 1.84 mg/dL — ABNORMAL HIGH (ref 0.61–1.24)
GFR, Estimated: 37 mL/min — ABNORMAL LOW (ref >60)
Glucose, Bld: 81 mg/dL (ref 70–99)
Potassium: 4.9 mmol/L (ref 3.5–5.1)
Sodium: 133 mmol/L — ABNORMAL LOW (ref 135–145)
Total Bilirubin: 0.9 mg/dL (ref 0.0–1.2)
Total Protein: 4.7 g/dL — ABNORMAL LOW (ref 6.5–8.1)

## 2023-11-03 LAB — CBC WITH DIFFERENTIAL/PLATELET
Abs Immature Granulocytes: 0.03 10*3/uL (ref 0.00–0.07)
Basophils Absolute: 0 10*3/uL (ref 0.0–0.1)
Basophils Relative: 1 %
Eosinophils Absolute: 0.2 10*3/uL (ref 0.0–0.5)
Eosinophils Relative: 4 %
HCT: 24.4 % — ABNORMAL LOW (ref 39.0–52.0)
Hemoglobin: 8.1 g/dL — ABNORMAL LOW (ref 13.0–17.0)
Immature Granulocytes: 1 %
Lymphocytes Relative: 13 %
Lymphs Abs: 0.5 10*3/uL — ABNORMAL LOW (ref 0.7–4.0)
MCH: 31.3 pg (ref 26.0–34.0)
MCHC: 33.2 g/dL (ref 30.0–36.0)
MCV: 94.2 fL (ref 80.0–100.0)
Monocytes Absolute: 0.1 10*3/uL (ref 0.1–1.0)
Monocytes Relative: 3 %
Neutro Abs: 3.3 10*3/uL (ref 1.7–7.7)
Neutrophils Relative %: 78 %
Platelets: 68 10*3/uL — ABNORMAL LOW (ref 150–400)
RBC: 2.59 MIL/uL — ABNORMAL LOW (ref 4.22–5.81)
RDW: 14.6 % (ref 11.5–15.5)
WBC: 4.1 10*3/uL (ref 4.0–10.5)
nRBC: 0 % (ref 0.0–0.2)

## 2023-11-03 LAB — PHOSPHORUS: Phosphorus: 2.4 mg/dL — ABNORMAL LOW (ref 2.5–4.6)

## 2023-11-03 LAB — IMMATURE PLATELET FRACTION: Immature Platelet Fraction: 4 % (ref 1.2–8.6)

## 2023-11-03 LAB — MAGNESIUM: Magnesium: 2 mg/dL (ref 1.7–2.4)

## 2023-11-03 MED ORDER — ENSURE PLUS HIGH PROTEIN PO LIQD
237.0000 mL | Freq: Three times a day (TID) | ORAL | Status: DC
  Administered 2023-11-03 – 2023-11-05 (×6): 237 mL via ORAL

## 2023-11-03 NOTE — Progress Notes (Signed)
 Mobility Specialist - Progress Note   11/03/23 1000  Mobility  Activity Transferred from chair to bed  Level of Assistance Moderate assist, patient does 50-74%  Assistive Device Front wheel walker  Activity Response Tolerated well  Mobility visit 1 Mobility     Pt was sitting in recliner upon arrival, utilizing RA. Pt requesting return to bed. 2 STS attempts but pt unable to stand and clear buttocks with +1 assist so extra assistance called to room. Pt able to transfer to bed with modA +2. Mild B knee buckling with transfer. Pt returned supine with alarm set, needs in reach.    Searcy Czech Mobility Specialist 11/03/23, 10:28 AM

## 2023-11-03 NOTE — Evaluation (Addendum)
 Occupational Therapy Evaluation Patient Details Name: Zachary [name] MRN: [id number] DOB: May 30, 1941 Today's Date: 11/03/2023   History of Present Illness   Pt is an 83 y.o. male presenting to hospital 10/30/23 with c/o weakness and SOB.  Pt admitted with symptomatic anemia and iron  deficiency anemia.  S/p colonoscopy and upper GI endoscopy 11/02/23.  PMH includes htn, HLD, CAD, prior NSTEMI, thoracic aortic aneurysm, hospitalization April 2025 for sepsis secondary to PNA, ESBL, UTI's, h/o PE, hypotension on midodrine , CHF, suprapubic catheter placement, MRSA bacteremia d/t cavitary PNA, vertigo.     Clinical Impressions Patient presenting with decreased Ind in self care,balance, functional mobility/transfers, and endurance. Pt has been at SNF for rehab but was living independently at home with wife prior. Patient very fatigued and having returned to bed when therapist enters the room. Pt having difficulty remaining awake and refuses OOB activities. OT anticipates pt would need min A for UB and max A for LB self care. Pt needing mod -max A for repositioning in bed and placed into R side lying. Pt having difficulty remaining awake.  Patient will benefit from acute OT to increase overall independence in the areas of ADLs, functional mobility, and safety awareness in order to safely discharge.     If plan is discharge home, recommend the following:   A little help with walking and/or transfers;A little help with bathing/dressing/bathroom;Assistance with cooking/housework;Assist for transportation;Help with stairs or ramp for entrance     Functional Status Assessment   Patient has had a recent decline in their functional status and demonstrates the ability to make significant improvements in function in a reasonable and predictable amount of time.     Equipment Recommendations   Other (comment) (defer to next venue of care)      Precautions/Restrictions   Precautions Precautions:  Fall Recall of Precautions/Restrictions: Intact Precaution/Restrictions Comments: suprapubic cath; very fragile skin Restrictions Weight Bearing Restrictions Per Provider Order: No     Mobility Bed Mobility Overal bed mobility: Needs Assistance Bed Mobility: Rolling Rolling: Min assist, Mod assist              Transfers                              ADL either performed or assessed with clinical judgement   ADL Overall ADL's : Needs assistance/impaired                                       General ADL Comments: Pt is fatigued and having just returned to bed with hematuria noted with blood all around foley. Min A - mod A for bed mobility for hygiene and linen change.     Vision Baseline Vision/History: 1 Wears glasses Patient Visual Report: No change from baseline              Pertinent Vitals/Pain Pain Assessment Pain Assessment: No/denies pain     Extremity/Trunk Assessment Upper Extremity Assessment Upper Extremity Assessment: Generalized weakness   Lower Extremity Assessment Lower Extremity Assessment: Generalized weakness       Communication Communication Communication: Impaired Factors Affecting Communication: Hearing impaired   Cognition Arousal: Alert Behavior During Therapy: WFL for tasks assessed/performed Cognition: No apparent impairments             OT - Cognition Comments: Pt is very pleasant and cooperative.  Following commands: Intact       Cueing  General Comments   Cueing Techniques: Verbal cues              Home Living Family/patient expects to be discharged to::  (has been at rehab and should return at discharge and then transition to LTC) Living Arrangements: Spouse/significant other Available Help at Discharge: Family;Available PRN/intermittently Type of Home: House Home Access: Level entry     Home Layout: One level     Bathroom Shower/Tub: Retail banker: Handicapped height Bathroom Accessibility: Yes   Home Equipment: Agricultural consultant (2 wheels);Shower seat;Grab bars - toilet;Grab bars - tub/shower   Additional Comments: Pt was living with his wife but has recently been at rehab (plan to transition to LTC)      Prior Functioning/Environment                 ADLs Comments: Pt was Ind for ADLs up until last hospital admission and living with wife. He has been at [facility] for rehab since then.    OT Problem List: Decreased strength;Decreased activity tolerance;Decreased safety awareness;Impaired balance (sitting and/or standing);Decreased knowledge of use of DME or AE   OT Treatment/Interventions: Self-care/ADL training;Therapeutic exercise;Therapeutic activities;Energy conservation;DME and/or AE instruction;Patient/family education;Balance training      OT Goals(Current goals can be found in the care plan section)   Acute Rehab OT Goals Patient Stated Goal: to get better and feel better OT Goal Formulation: With patient Time For Goal Achievement: 11/17/23 Potential to Achieve Goals: Fair ADL Goals Pt Will Perform Grooming: with supervision;standing Pt Will Perform Lower Body Dressing: with supervision;sit to/from stand Pt Will Transfer to Toilet: with supervision Pt Will Perform Toileting - Clothing Manipulation and hygiene: with supervision;sit to/from stand   OT Frequency:  Min 2X/week       AM-PAC OT "6 Clicks" Daily Activity     Outcome Measure Help from another person eating meals?: None Help from another person taking care of personal grooming?: None Help from another person toileting, which includes using toliet, bedpan, or urinal?: A Little Help from another person bathing (including washing, rinsing, drying)?: A Little Help from another person to put on and taking off regular upper body clothing?: None Help from another person to put on and taking off regular lower body clothing?: A  Little 6 Click Score: 21   End of Session Nurse Communication: Mobility status  Activity Tolerance: Patient limited by fatigue Patient left: with call bell/phone within reach;in bed;with bed alarm set;with nursing/sitter in room  OT Visit Diagnosis: Unsteadiness on feet (R26.81);Muscle weakness (generalized) (M62.81)                Time: 1610-9604 OT Time Calculation (min): 16 min Charges:  OT General Charges $OT Visit: 1 Visit OT Evaluation $OT Eval Low Complexity: 1 Low  George Kinder, MS, OTR/L , CBIS ascom [phone]  11/03/23, 3:16 PM

## 2023-11-03 NOTE — Progress Notes (Signed)
 PROGRESS NOTE    Zachary [name]  GEX:[id number] DOB: 02-Feb-1941 DOA: 10/30/2023 PCP: [name], MD  Chief Complaint  Patient presents with   Weakness    Hospital Course:  This is an 83 year old male with hypotension on midodrine , hyperlipidemia, CAD, systolic CHF, CKD stage IV, anemia of chronic disease, history of pulmonary embolism on Eliquis , ESBL UTI, status post suprapubic catheter placement, and recent MRSA bacteremia secondary to cavitary pneumonia currently on linezolid .  He presents with weakness and shortness of breath from his skilled nursing facility.  Patient was just hospitalized 4/29 through 5/13 with MRSA bacteremia secondary to cavitary pneumonia.  He was discharged on linezolid  with plans to continue medication from 5/13 through 5/30.  He is following outpatient with infectious disease. Patient has been short of breath, with occasional cough but no chest pain.  He denies any dark stools or rectal bleeding.  He had labs at the skilled nursing facility which revealed a hemoglobin of 6.8.  In the ED hemoglobin found to be 6.4.  He was hemodynamically stable and other labs unremarkable.  Gastroenterology was consulted.  Subjective: Patient was able to work with physical therapy today.  This is the first time he has been up and out of bed.  He is happy to get moving again.  Objective: Vitals:   11/03/23 0003 11/03/23 0239 11/03/23 0620 11/03/23 0905  BP:  113/71 103/65 114/84  Pulse:  82 69 79  Resp:  18 18 18   Temp:  97.9 F (36.6 C) 97.7 F (36.5 C) 97.7 F (36.5 C)  TempSrc:  Oral Oral   SpO2:  99% 100% 99%  Weight: 71.4 kg     Height:        Intake/Output Summary (Last 24 hours) at 11/03/2023 1600 Last data filed at 11/03/2023 1300 Gross per 24 hour  Intake 120 ml  Output 750 ml  Net -630 ml   Filed Weights   11/01/23 0349 11/02/23 0344 11/03/23 0003  Weight: 69.7 kg 73 kg 71.4 kg    Examination: General exam: Appears calm and comfortable,  NAD, frail, weak Respiratory system: No work of breathing, symmetric chest wall expansion Cardiovascular system: S1 & S2 heard, RRR.  Gastrointestinal system: Abdomen is nondistended, soft and nontender.  Neuro: Drowsy but arousable. No focal neurological deficits. Extremities: Symmetric, expected ROM Skin: Extremities covered in bruises in multiple stages of healing bruising appears to be improving some. Psychiatry: Demonstrates appropriate judgement and insight. Mood & affect appropriate for situation.   Assessment & Plan:  Principal Problem:   Symptomatic anemia Active Problems:   Iron  deficiency anemia   Chronic systolic CHF (congestive heart failure) (HCC)   Thrombocytopenia (HCC)   Cavitary pneumonia   Pulmonary embolism (HCC)   CAD (coronary artery disease)   HLD (hyperlipidemia)   CKD (chronic kidney disease) stage 4, GFR 15-29 ml/min (HCC)   Protein-calorie malnutrition, severe   GI bleed   Pressure injury of skin    Acute on chronic anemia History of iron  deficiency anemia  Anemia of chronic disease - Baseline hemoglobin appears to be 8.0, has dropped to 6.4 - Patient is taking Eliquis , have held - EGD and colonoscopy 6/1: Upper endoscopy revealed hiatal hernia and gastropathy, no active bleeding.  Schatzki's ring appreciated.  Colonoscopy reveals stool burden, no fresh blood or old blood.  Colon lumen dilated consistent with chronic constipation PPI twice daily x 8 weeks - Hold off on resuming Eliquis  for now given worsening thrombocytopenia - Avoid NSAIDs -  Status post 2 units PRBC.  Hemoglobin has risen to 9.0 but is downtrending again today.  Continue to monitor closely - No melena or bright red blood per rectum - Repeat CBC in a.m. - Anemia panel: elevated B12, elevated ferritin, elevated iron  levels and TSAT.  Appears to all be secondary to anemia of chronic disease.  No real evidence of iron  deficiency.    Chronic systolic congestive heart failure - Echo  10/12/2023: EF 35 to 40% - Some increasing lower extremity edema.  He is status post IV Lasix  x 1.  Will start 20 mg p.o. Lasix  daily. - BP is soft, diuretics as tolerated - Remains stable on room air - Monitor volume status closely  Thrombocytopenia - Complicated by symptomatic acute anemia as above - May also be secondary to prolonged course of linezolid  which is now discontinued - Linezolid  has been discontinued but platelets continue to downtrend - Patient has bruising over most of his body - LDH within normal limits - Normal platelet morphology on smear - Have consulted hematology, appreciate recommendations - Consider transfusion if platelets continue to drop - Continue to hold all anticoagulation  Recent cavitary pneumonia Recent MRSA bacteremia - Has been on extended course of Zyvox .  Course completed 5/30. - Currently afebrile without leukocytosis.  No acute concern for infection.  Pulmonary embolism - Meant to be on Eliquis , holding for now given anemia and thrombocytopenia. - Continue holding for now given downtrending hemoglobin and downtrending platelets  CAD - Hold aspirin  - Continue Lipitor  Hyperlipidemia - Continue statin  CKD stage IV - Baseline creatinine 2.1 -Currently improved beyond baseline - Renally dosed with a creatinine clearance of 34 - Avoid nephrotoxic medications  Protein calorie malnutrition, severe BMI 16 - Nutrition consult  Hyponatremia - Chronic.  Appears close to baseline currently - AM BMP  Poor prognosis - Given patient's advanced age, severe protein calorie malnutrition, chronic weight loss, thrombocytopenia, pulmonary embolism on Eliquis , recent bacteremia, and now inability to tolerate anticoagulation he has an overall poor prognosis.  I discussed his care directly with his wife, [name].  At this time shed like us  to continue to do everything we can.  I did offer her palliative care and hospice in the event that he changes his  mind and would like to change his focus of treatment.  She endorses understanding.  Will continue to touch base throughout this admission.  Palliative care also consulted.  Appreciate assistance with GOC conversations.  DVT prophylaxis: SCDs, avoid chemical anticoagulation given acute anemia   Code Status: Limited: Do not attempt resuscitation (DNR) -DNR-LIMITED -Do Not Intubate/DNI  Disposition: Observation admission.  Worsening thrombocytopenia.  Continue monitoring. Consultants:    Procedures:    Antimicrobials:  Anti-infectives (From admission, onward)    Start     Dose/Rate Route Frequency Ordered Stop   10/30/23 2315  linezolid  (ZYVOX ) tablet 600 mg        600 mg Oral Every 12 hours 10/30/23 2217 10/31/23 1036       Data Reviewed: I have personally reviewed following labs and imaging studies CBC: Recent Labs  Lab 10/30/23 1539 10/30/23 2236 10/31/23 0130 10/31/23 0649 10/31/23 0838 11/02/23 0654 11/03/23 0516  WBC 7.1 5.6 5.3 4.5  --  5.9 4.1  NEUTROABS 6.1  --   --   --   --  5.0 3.3  HGB 6.4* 7.2* 7.2* 8.4* 8.4* 9.0* 8.1*  HCT 20.1* 22.0* 20.7* 24.2* 24.8* 26.2* 24.4*  MCV 102.6* 96.1 94.1 93.1  --  93.2 94.2  PLT 119* 108* 93* 83*  --  78* 68*   Basic Metabolic Panel: Recent Labs  Lab 10/30/23 1539 10/31/23 0649 11/01/23 0641 11/02/23 0654 11/03/23 0516  NA 132* 132* 130* 129* 133*  K 4.9 4.6 4.6 4.3 4.9  CL 103 102 99 100 104  CO2 17* 20* 21* 26 24  GLUCOSE 97 81 79 79 81  BUN 43* 46* 39* 35* 36*  CREATININE 2.03* 2.19* 1.86* 1.79* 1.84*  CALCIUM  8.4* 8.4* 8.1* 8.1* 8.2*  MG  --   --  2.0 1.9 2.0  PHOS  --   --  2.5 2.4* 2.4*   GFR: Estimated Creatinine Clearance: 32.3 mL/min (A) (by C-G formula based on SCr of 1.84 mg/dL (H)). Liver Function Tests: Recent Labs  Lab 10/30/23 1539 11/02/23 0654 11/03/23 0516  AST 33 29 23  ALT 34 31 28  ALKPHOS 89 81 78  BILITOT 0.5 0.9 0.9  PROT 5.3* 5.1* 4.7*  ALBUMIN 2.4* 2.3* 2.2*   CBG: No  results for input(s): "GLUCAP" in the last 168 hours.  No results found for this or any previous visit (from the past 240 hours).   Radiology Studies: No results found.  Scheduled Meds:  vitamin C   500 mg Oral BID   atorvastatin   10 mg Oral Daily   feeding supplement  237 mL Oral TID BM   iron  polysaccharides  150 mg Oral Daily   midodrine   10 mg Oral TID WC   multivitamin with minerals  1 tablet Oral Daily   pantoprazole  (PROTONIX ) IV  40 mg Intravenous Q12H   Continuous Infusions:     LOS: 0 days  MDM: Patient is high risk for one or more organ failure.  They necessitate ongoing hospitalization for continued IV therapies and subsequent lab monitoring. Total time spent interpreting labs and vitals, reviewing the medical record, coordinating care amongst consultants and care team members, directly assessing and discussing care with the patient and/or family: 55 min  Doneisha Ivey, DO [facility]  To contact the attending physician between 7A-7P please use Epic Chat. To contact the covering physician during after hours 7P-7A, please review Amion.  11/03/2023, 4:00 PM   *This document has been created with the assistance of dictation software. Please excuse typographical errors. *

## 2023-11-03 NOTE — Consult Note (Signed)
 Consultation Note Date: 11/03/2023 at 1100    Patient Name: Zachary [name]  DOB: Dec 07, 1940  MRN: [id number]  Age / Sex: 83 y.o., male  PCP: [name], MD Referring Physician: Roise Cleaver, DO  HPI/Patient Profile: 83 y.o. male  with past medical history of hypotension (midodrine ), CAD, systolic CHF, HLD, CKD (stage IV), anemia, PE (Eliquis ), ESBL UTI, MRSA bacteremia due to cavitary pneumonia (linezolid ), and urinary retention s/p L4 suprapubic catheter placement admitted on 10/30/2023 with shortness of breath.  Of note, patient was hospitalized for MRSA bacteria secondary to cavitary pneumonia (4/29 - 5/13) and is being followed by Dr. Francee Inch of ID.   PTA, patient endorsed he had labs in his facility which revealed hemoglobin of 6.8.  On arrival to ED, hemoglobin was 6.4.  Patient is being treated for symptomatic anemia, chronic systolic congestive heart failure, thrombocytopenia, and protein calorie malnutrition.  PMT was consulted to support family and patient with goals of care discussions.  Of note, patient is familiar to PMT and this provider has not followed Mr. Sauseda during his previous hospitalization.  Clinical Assessment and Goals of Care: Extensive chart review completed prior to meeting patient including labs, vital signs, imaging, progress notes, orders, and available advanced directive documents from current and previous encounters. I then met with patient at bedside to discuss diagnosis prognosis, GOC, EOL wishes, disposition and options.  I re-introduced Palliative Medicine as specialized medical care for people living with serious illness. It focuses on providing relief from the symptoms and stress of a serious illness. The goal is to improve quality of life for both the patient and the family.  Patient recalls our discussions during his previous hospitalization.  We  discussed a brief life review of the patient.  Patient shares that since discharge from the hospital, he has been in [facility] for rehab.  He remains concerned about getting better so that he can be reunited with his wife.  He shares his wife's knee surgery is scheduled for this Wednesday.  As far as functional and nutritional status, patient shares that he has felt weak and significantly tired over the past several weeks.  He shares he has been trying to participate with therapies at rehab but has just been worn out.  We discussed patient's current illness-symptomatic anemia-and what it means in the larger context of patient's on-going co-morbidities.  Reviewed colonoscopy, endoscopy, and gastroenterology's recommendations.   During our discussion, patient repeated "I am going to get better, right?".  He also stated several times, "I am going to pull through this".  Honest discussion of patient's advanced age, thrombocytopenia, recent hospitalization, malnutrition, and overall poor functional status discussed in context of patient's prognosis.  When asked his thoughts about his current medical situation, he shares that he and his wife are "not ready to give up".  He shares he wants to continue "to fight and get better".  I attempted to elicit values and goals of care important to the patient.  Patient states he wants to continue  to work with therapy so that he can get stronger.  His overall goal is to be able to return home with his wife.  Questions and concerns were addressed. Patient in agreement for me to speak with his wife.  After visiting with the patient, I spoke with his wife [name] over the phone.  Medical update given.  Discussed boundaries and goals of care with Dell.  She is in agreement with patient's current plan to continue to treat the treatable.  She recognizes that he is weak from previous hospitalizations and this symptomatic anemia.  However, she remains hopeful that he can  return to SNF for continued rehab.  No adjustment to plan of care at this time.  PMT will continue to follow and support patient and family throughout his hospitalization.   Primary Decision Maker PATIENT  Physical Exam Vitals reviewed.  Constitutional:      General: He is not in acute distress.    Appearance: He is normal weight.  HENT:     Head: Normocephalic.     Nose: Nose normal.     Mouth/Throat:     Mouth: Mucous membranes are moist.  Eyes:     Pupils: Pupils are equal, round, and reactive to light.  Cardiovascular:     Pulses: Normal pulses.  Pulmonary:     Effort: Pulmonary effort is normal.  Abdominal:     Palpations: Abdomen is soft.  Musculoskeletal:     Comments: Generalized weakness  Skin:    General: Skin is warm and dry.  Neurological:     Mental Status: He is alert and oriented to person, place, and time.  Psychiatric:        Mood and Affect: Mood normal.        Behavior: Behavior normal.        Judgment: Judgment normal.     Palliative Assessment/Data: 60%     Thank you for this consult. Palliative medicine will continue to follow and assist holistically.   Time Total: 75 minutes  Time spent includes: Detailed review of medical records (labs, imaging, vital signs), medically appropriate exam (mental status, respiratory, cardiac, skin), discussed with treatment team, counseling and educating patient, family and staff, documenting clinical information, medication management and coordination of care.  Signed by: Eller Gut, DNP, FNP-BC Palliative Medicine   Please contact Palliative Medicine Team providers via [facility] for questions and concerns.

## 2023-11-03 NOTE — Plan of Care (Signed)

## 2023-11-03 NOTE — Evaluation (Signed)
 Physical Therapy Evaluation Patient Details Name: Zachary [name] MRN: [id number] DOB: 08-07-1940 Today's Date: 11/03/2023  History of Present Illness  Pt is an 83 y.o. male presenting to hospital 10/30/23 with c/o weakness and SOB.  Pt admitted with symptomatic anemia and iron  deficiency anemia.  S/p colonoscopy and upper GI endoscopy 11/02/23.  PMH includes htn, HLD, CAD, prior NSTEMI, thoracic aortic aneurysm, hospitalization April 2025 for sepsis secondary to PNA, ESBL, UTI's, h/o PE, hypotension on midodrine , CHF, suprapubic catheter placement, MRSA bacteremia d/t cavitary PNA, vertigo.  Clinical Impression  Prior to recent medical concerns and hospitalizations, pt reports being ambulatory and living with his wife; recently pt has been at [facility] receiving rehab to improve functional status.  No c/o pain during session.  Currently pt is SBA with bed mobility; min assist x2 for transfers with RW use; and CGA x2 (2nd assist for safety) walking a few feet bed to recliner with RW use.  Pt would currently benefit from skilled PT to address noted impairments and functional limitations (see below for any additional details).  Upon hospital discharge, pt would benefit from ongoing therapy.     If plan is discharge home, recommend the following: A lot of help with walking and/or transfers;A little help with bathing/dressing/bathroom;Assistance with cooking/housework;Assist for transportation;Help with stairs or ramp for entrance   Can travel by private vehicle   No    Equipment Recommendations Other (comment) (TBD at next facility)  Recommendations for Other Services       Functional Status Assessment Patient has had a recent decline in their functional status and demonstrates the ability to make significant improvements in function in a reasonable and predictable amount of time.     Precautions / Restrictions Precautions Precautions: Fall Recall of Precautions/Restrictions:  Intact Precaution/Restrictions Comments: suprapubic cath; very fragile skin Restrictions Weight Bearing Restrictions Per Provider Order: No      Mobility  Bed Mobility Overal bed mobility: Needs Assistance Bed Mobility: Rolling Rolling: Supervision (to L side)   Supine to sit: Supervision, HOB elevated     General bed mobility comments: SBA for safety    Transfers Overall transfer level: Needs assistance Equipment used: Rolling walker (2 wheels) Transfers: Sit to/from Stand Sit to Stand: Min assist, +2 physical assistance           General transfer comment: vc's for UE positioning (to push off of bed to stand up and reach back for chair arm)    Ambulation/Gait Ambulation/Gait assistance: Contact guard assist, +2 safety/equipment Gait Distance (Feet): 3 Feet (bed to recliner) Assistive device: Rolling walker (2 wheels)   Gait velocity: decreased     General Gait Details: decreased B LE step length/foot clearance; mild increased time to take steps  Stairs            Wheelchair Mobility     Tilt Bed    Modified Rankin (Stroke Patients Only)       Balance Overall balance assessment: Needs assistance Sitting-balance support: No upper extremity supported, Feet supported Sitting balance-Leahy Scale: Good Sitting balance - Comments: steady reaching within BOS   Standing balance support: Bilateral upper extremity supported, Reliant on assistive device for balance Standing balance-Leahy Scale: Fair Standing balance comment: steady static standing with RW use                             Pertinent Vitals/Pain Pain Assessment Pain Assessment: No/denies pain HR 70 bpm pre-activity and 90 bpm  post activity; SpO2 sats 98% or higher beginning/end of session.    Home Living Family/patient expects to be discharged to:: Other (Comment)                   Additional Comments: Pt was living with his wife but has recently been at rehab (plan  to transition to LTC)    Prior Function Prior Level of Function : Needs assist             Mobility Comments: Prior to recent hospitalizations, pt was ambulatory (no AD typically but sometimes used RW).  Recently has been at [facility] receiving therapy (working on strengthening and walking).  1 recent fall at SNF (slipped in shower).       Extremity/Trunk Assessment   Upper Extremity Assessment Upper Extremity Assessment: Generalized weakness    Lower Extremity Assessment Lower Extremity Assessment: Generalized weakness (at least 3/5 AROM hip flexion, knee extension, and DF)    Cervical / Trunk Assessment Cervical / Trunk Assessment: Normal  Communication   Communication Communication: Impaired Factors Affecting Communication: Hearing impaired    Cognition Arousal: Alert Behavior During Therapy: WFL for tasks assessed/performed                           PT - Cognition Comments: A&O x4 (except pt initially stating it was July but corrected it to June) Following commands: Intact       Cueing Cueing Techniques: Verbal cues     General Comments General comments (skin integrity, edema, etc.): Bruising noted B UE's and upper chest; pt with bandages UE's (d/t skin tears).  Nursing cleared pt for participation in physical therapy.  Pt agreeable to PT session.     Exercises     Assessment/Plan    PT Assessment Patient needs continued PT services  PT Problem List Decreased strength;Decreased activity tolerance;Decreased balance;Decreased mobility;Decreased skin integrity       PT Treatment Interventions DME instruction;Gait training;Functional mobility training;Therapeutic activities;Therapeutic exercise;Balance training;Patient/family education    PT Goals (Current goals can be found in the Care Plan section)  Acute Rehab PT Goals Patient Stated Goal: improve strength and functional mobility PT Goal Formulation: With patient Time For Goal Achievement:  11/17/23 Potential to Achieve Goals: Good    Frequency Min 2X/week     Co-evaluation               AM-PAC PT "6 Clicks" Mobility  Outcome Measure Help needed turning from your back to your side while in a flat bed without using bedrails?: A Little Help needed moving from lying on your back to sitting on the side of a flat bed without using bedrails?: A Little Help needed moving to and from a bed to a chair (including a wheelchair)?: A Little Help needed standing up from a chair using your arms (e.g., wheelchair or bedside chair)?: A Lot Help needed to walk in hospital room?: A Little Help needed climbing 3-5 steps with a railing? : A Lot 6 Click Score: 16    End of Session Equipment Utilized During Treatment: Gait belt (gait belt up high away from suprapubic catheter) Activity Tolerance: Patient tolerated treatment well Patient left: in chair;with call bell/phone within reach;with chair alarm set;with family/visitor present;with SCD's reapplied Nurse Communication: Mobility status;Precautions PT Visit Diagnosis: Muscle weakness (generalized) (M62.81);History of falling (Z91.81);Other abnormalities of gait and mobility (R26.89)    Time: 1914-7829 PT Time Calculation (min) (ACUTE ONLY): 25 min   Charges:  PT Evaluation $PT Eval Low Complexity: 1 Low   PT General Charges $$ ACUTE PT VISIT: 1 Visit        Amador Junes, PT 11/03/23, 10:01 AM

## 2023-11-03 NOTE — Consult Note (Signed)
 Hematology/Oncology Consult note [phone] [phone]      Patient Care Team: [name], MD as PCP - General (Family Medicine) Dingeldein, [name], MD as Consulting Physician (Ophthalmology) [name], MD as Consulting Physician (Hematology and Oncology)   Name of the patient: Zachary [name]  [id number]  09/29/1940   REASON FOR COSULTATION:  Thrombocytopenia, anemia History of presenting illness-  83 y.o. male with past medical history including hypertension, hyperlipidemia, CAD, CHF, CKD stage IV, anemia of chronic disease, pulmonary embolism on Eliquis , ESBL UTI, suprapubic catheter placement, MRSA bacteremia secondary to cavitary pneumonia finished course of treatments through 10/31/2023 for is currently admitted due to anemia with hemoglobin 6.4. EGD and colonoscopy 6/1: Upper endoscopy revealed hiatal hernia and gastropathy, no active bleeding. Schatzki's ring appreciated. Colonoscopy reveals stool burden, no fresh blood or old blood.  Eliquis  was held due to risk of GI bleeding/thrombocytopenia. Thrombocytopenia, platelet count was 119 on 10/30/2023, at presentation. was initially thought to be secondary to linezolid  which has now been discontinued since 11/01/2023.  11/03/2023, reticulocyte count 68,000.  Hematology was consulted for further evaluation.  Patient was seen at bedside.  He reports feeling well today.  No report of acute bleeding events. + Easy bruising   No Known Allergies  Patient Active Problem List   Diagnosis Date Noted   Normocytic anemia 09/26/2023    Priority: High   Weight loss 09/26/2023    Priority: Medium    Leukocytosis 09/26/2023    Priority: Medium    Lung nodule 03/13/2022    Priority: Medium    GI bleed 10/31/2023   Pressure injury of skin 10/31/2023   Symptomatic anemia 10/30/2023   HLD (hyperlipidemia) 10/30/2023   CAD (coronary artery disease) 10/30/2023   Pulmonary embolism (HCC) 10/30/2023   Iron   deficiency anemia 10/30/2023   Chronic systolic CHF (congestive heart failure) (HCC) 10/30/2023   Thrombocytopenia (HCC) 10/30/2023   Cavitary pneumonia 10/06/2023   MRSA bacteremia 10/03/2023   Protein-calorie malnutrition, severe 10/01/2023   Sepsis (HCC) 09/30/2023   Pneumonia 09/30/2023   UTI (urinary tract infection) 09/30/2023   Acute encephalopathy 09/30/2023   CKD (chronic kidney disease) stage 4, GFR 15-29 ml/min (HCC) 09/30/2023   NSTEMI (non-ST elevated myocardial infarction) (HCC) 09/30/2023   Acute kidney injury superimposed on chronic kidney disease (HCC) 08/05/2023   Peritonitis (HCC) 08/05/2023   Fluid overload 08/05/2023   Aneurysm of thoracic aorta (HCC) 03/13/2022   History of colonic polyps    B12 deficiency anemia 08/03/2019   Elevated MCV 08/03/2019   Cyst, baker's knee, right 07/27/2019   Peripheral edema- right lower leg  07/27/2019   Coronary artery disease involving native coronary artery of native heart without angina pectoris 04/12/2019   Valvular heart disease 04/12/2019   Pneumothorax on right 05/15/2015   Benign essential tremor 02/28/2015   Mixed hyperlipidemia 02/28/2015   Essential hypertension 02/28/2015   Hypercholesterolemia without hypertriglyceridemia 02/28/2015   Vertigo 02/28/2015     Past Medical History:  Diagnosis Date   B12 deficiency anemia 08/03/2019   Hypercholesterolemia    Hypertension    Vertigo    last episode approx 09/2018     Past Surgical History:  Procedure Laterality Date   APPENDECTOMY     CATARACT EXTRACTION Bilateral    COLONOSCOPY N/A 11/02/2023   Procedure: COLONOSCOPY;  Surgeon: Quintin Buckle, DO;  Location: [facility] ENDOSCOPY;  Service: Gastroenterology;  Laterality: N/A;   COLONOSCOPY WITH PROPOFOL  N/A 12/24/2018   Procedure: COLONOSCOPY WITH PROPOFOL ;  Surgeon: Ole Berkeley,  Darren, MD;  Location: [facility] ENDOSCOPY;  Service: Endoscopy;  Laterality: N/A;   COLONOSCOPY WITH PROPOFOL  N/A 12/13/2019   Procedure:  COLONOSCOPY WITH BIOPSY ;  Surgeon: Marnee Sink, MD;  Location: [facility] SURGERY CNTR;  Service: Endoscopy;  Laterality: N/A;  priority 3   ESOPHAGOGASTRODUODENOSCOPY N/A 11/02/2023   Procedure: EGD (ESOPHAGOGASTRODUODENOSCOPY);  Surgeon: Quintin Buckle, DO;  Location: [facility] ENDOSCOPY;  Service: Gastroenterology;  Laterality: N/A;   IR CYSTOSTOMY TUBE PLACEMENT/BLADDER ASPIRATION  08/26/2023   PLEURAL SCARIFICATION  05/2015   POLYPECTOMY N/A 12/13/2019   Procedure: POLYPECTOMY;  Surgeon: Marnee Sink, MD;  Location: [facility] SURGERY CNTR;  Service: Endoscopy;  Laterality: N/A;   TEE WITHOUT CARDIOVERSION N/A 10/07/2023   Procedure: ECHOCARDIOGRAM, TRANSESOPHAGEAL;  Surgeon: Alluri, Odessa Bene, MD;  Location: [facility] ORS;  Service: Cardiovascular;  Laterality: N/A;    Social History   Socioeconomic History   Marital status: Married    Spouse name: Not on file   Number of children: 0   Years of education: Not on file   Highest education level: 12th grade  Occupational History   Occupation: retired  Tobacco Use   Smoking status: Never   Smokeless tobacco: Never  Vaping Use   Vaping status: Never Used  Substance and Sexual Activity   Alcohol  use: No    Alcohol /week: 0.0 standard drinks of alcohol    Drug use: No   Sexual activity: Not on file  Other Topics Concern   Not on file  Social History Narrative   Lives with [name], wife.    Social Drivers of Corporate investment banker Strain: Low Risk  (09/25/2021)   Overall Financial Resource Strain (CARDIA)    Difficulty of Paying Living Expenses: Not hard at all  Food Insecurity: No Food Insecurity (10/31/2023)   Hunger Vital Sign    Worried About Running Out of Food in the Last Year: Never true    Ran Out of Food in the Last Year: Never true  Transportation Needs: No Transportation Needs (10/31/2023)   PRAPARE - Administrator, Civil Service (Medical): No    Lack of Transportation (Non-Medical): No  Physical Activity:  Insufficiently Active (09/25/2021)   Exercise Vital Sign    Days of Exercise per Week: 2 days    Minutes of Exercise per Session: 60 min  Stress: No Stress Concern Present (11/24/2019)   [name]    Feeling of Stress : Not at all  Social Connections: Moderately Isolated (10/31/2023)   Social Connection and Isolation Panel [NHANES]    Frequency of Communication with Friends and Family: Twice a week    Frequency of Social Gatherings with Friends and Family: More than three times a week    Attends Religious Services: Never    Database administrator or Organizations: No    Attends Banker Meetings: Never    Marital Status: Married  Catering manager Violence: Not At Risk (10/31/2023)   Humiliation, Afraid, Rape, and Kick questionnaire    Fear of Current or Ex-Partner: No    Emotionally Abused: No    Physically Abused: No    Sexually Abused: No     Family History  Problem Relation Age of Onset   Hypertension Mother    Hyperlipidemia Mother    Heart attack Father    Hypertension Father    CVA Father    ALS Brother    Prostate cancer Brother      Current Facility-Administered Medications:  acetaminophen  (TYLENOL ) tablet 650 mg, 650 mg, Oral, Q6H PRN, Niu, Xilin, MD, 650 mg at 11/03/23 1000   albuterol  (PROVENTIL ) (2.5 MG/3ML) 0.083% nebulizer solution 3 mL, 3 mL, Nebulization, Q4H PRN, Niu, Xilin, MD   ascorbic acid  (VITAMIN C ) tablet 500 mg, 500 mg, Oral, BID, Dezii, Alexandra, DO, 500 mg at 11/03/23 2110   atorvastatin  (LIPITOR) tablet 10 mg, 10 mg, Oral, Daily, Niu, Xilin, MD, 10 mg at 11/03/23 1000   dextromethorphan -guaiFENesin  (MUCINEX  DM) 30-600 MG per 12 hr tablet 1 tablet, 1 tablet, Oral, BID PRN, Niu, Xilin, MD   feeding supplement (ENSURE PLUS HIGH PROTEIN) liquid 237 mL, 237 mL, Oral, TID BM, Dezii, Alexandra, DO, 237 mL at 11/03/23 1928   hydrALAZINE  (APRESOLINE ) injection 5 mg, 5 mg,  Intravenous, Q2H PRN, Niu, Xilin, MD   iron  polysaccharides (NIFEREX) capsule 150 mg, 150 mg, Oral, Daily, Niu, Xilin, MD, 150 mg at 11/03/23 1000   midodrine  (PROAMATINE ) tablet 10 mg, 10 mg, Oral, TID WC, Niu, Xilin, MD, 10 mg at 11/03/23 1632   multivitamin with minerals tablet 1 tablet, 1 tablet, Oral, Daily, Niu, Xilin, MD, 1 tablet at 11/03/23 1000   ondansetron  (ZOFRAN ) injection 4 mg, 4 mg, Intravenous, Q8H PRN, Niu, Xilin, MD, 4 mg at 11/03/23 2117   pantoprazole  (PROTONIX ) injection 40 mg, 40 mg, Intravenous, Q12H, Niu, Xilin, MD, 40 mg at 11/03/23 2110   polyethylene glycol-electrolytes (NuLYTELY ) solution 2,000 mL, 2,000 mL, Oral, Once PRN, Russo, Steven Michael, DO  Review of Systems  Constitutional:  Positive for fatigue. Negative for chills and fever.  HENT:   Negative for hearing loss and voice change.   Eyes:  Negative for eye problems and icterus.  Respiratory:  Negative for chest tightness, cough and shortness of breath.   Cardiovascular:  Negative for chest pain and leg swelling.  Gastrointestinal:  Negative for abdominal distention and abdominal pain.  Endocrine: Negative for hot flashes.  Genitourinary:  Negative for difficulty urinating, dysuria and frequency.        Suprapubic catheter  Musculoskeletal:  Negative for arthralgias.  Skin:  Negative for itching and rash.  Neurological:  Negative for light-headedness and numbness.       Falls  Hematological:  Negative for adenopathy. Bruises/bleeds easily.  Psychiatric/Behavioral:  Negative for confusion.     PHYSICAL EXAM Vitals:   11/03/23 0620 11/03/23 0905 11/03/23 1650 11/03/23 2019  BP: 103/65 114/84 96/63 114/63  Pulse: 69 79 74 73  Resp: 18 18 16 20   Temp: 97.7 F (36.5 C) 97.7 F (36.5 C) 97.7 F (36.5 C) 98 F (36.7 C)  TempSrc: Oral  Oral   SpO2: 100% 99% 98% 100%  Weight:      Height:       Physical Exam Constitutional:      General: He is not in acute distress.    Appearance: He is not  diaphoretic.     Comments: elderly male with frail appearance   HENT:     Head: Normocephalic and atraumatic.  Eyes:     General: No scleral icterus. Cardiovascular:     Rate and Rhythm: Normal rate and regular rhythm.     Heart sounds: No murmur heard. Pulmonary:     Effort: Pulmonary effort is normal. No respiratory distress.     Breath sounds: No wheezing.  Abdominal:     General: Bowel sounds are normal. There is no distension.     Palpations: Abdomen is soft.  Musculoskeletal:  General: Normal range of motion.     Cervical back: Normal range of motion and neck supple.     Right lower leg: Edema present.     Left lower leg: Edema present.  Skin:    General: Skin is warm and dry.     Findings: Bruising present. No erythema or lesion.  Neurological:     Mental Status: He is alert and oriented to person, place, and time. Mental status is at baseline.     Cranial Nerves: No cranial nerve deficit.     Motor: No abnormal muscle tone.  Psychiatric:        Mood and Affect: Mood and affect normal.       LABORATORY STUDIES    Latest Ref Rng & Units 11/03/2023    5:16 AM 11/02/2023    6:54 AM 10/31/2023    8:38 AM  CBC  WBC 4.0 - 10.5 K/uL 4.1  5.9    Hemoglobin 13.0 - 17.0 g/dL 8.1  9.0  8.4   Hematocrit 39.0 - 52.0 % 24.4  26.2  24.8   Platelets 150 - 400 K/uL 68  78        Latest Ref Rng & Units 11/03/2023    5:16 AM 11/02/2023    6:54 AM 11/01/2023    6:41 AM  CMP  Glucose 70 - 99 mg/dL 81  79  79   BUN 8 - 23 mg/dL 36  35  39   Creatinine 0.61 - 1.24 mg/dL 8.29  5.62  1.30   Sodium 135 - 145 mmol/L 133  129  130   Potassium 3.5 - 5.1 mmol/L 4.9  4.3  4.6   Chloride 98 - 111 mmol/L 104  100  99   CO2 22 - 32 mmol/L 24  26  21    Calcium  8.9 - 10.3 mg/dL 8.2  8.1  8.1   Total Protein 6.5 - 8.1 g/dL 4.7  5.1    Total Bilirubin 0.0 - 1.2 mg/dL 0.9  0.9    Alkaline Phos 38 - 126 U/L 78  81    AST 15 - 41 U/L 23  29    ALT 0 - 44 U/L 28  31       RADIOGRAPHIC  STUDIES: I have personally reviewed the radiological images as listed and agreed with the findings in the report. DG Chest Port 1 View Result Date: 10/10/2023 CLINICAL DATA:  Pleural effusion. EXAM: PORTABLE CHEST 1 VIEW COMPARISON:  Oct 08, 2023. FINDINGS: Stable cardiomediastinal silhouette. Stable cavitating lesion seen in right lung. Small pleural effusions are noted. Stable hiatal hernia. IMPRESSION: Stable large cavitating lesions seen in right lung base. Small pleural effusions. Stable hiatal hernia. Electronically Signed   By: Rosalene Colon M.D.   On: 10/10/2023 13:18   ECHOCARDIOGRAM COMPLETE Result Date: 10/09/2023    ECHOCARDIOGRAM REPORT   Patient Name:   RAMONE GANDER Date of Exam: 10/08/2023 Medical Rec #:  [id number]           Height:       74.0 in Accession #:    [id number]          Weight:       125.0 lb Date of Birth:  12-23-1940           BSA:          1.780 m Patient Age:    82 years            BP:  105/63 mmHg Patient Gender: M                   HR:           100 bpm. Exam Location:  [facility] Procedure: 2D Echo, Cardiac Doppler and Color Doppler (Both Spectral and Color            Flow Doppler were utilized during procedure). Indications:     Pulm embolis  History:         Patient has no prior history of Echocardiogram examinations.                  CAD; Risk Factors:Hypertension.  Sonographer:     Gaston Karvonen, FE, PE Referring Phys:  [id number] CARALYN HUDSON Diagnosing Phys: Lida Reeks Alluri IMPRESSIONS  1. Left ventricular ejection fraction, by estimation, is 35 to 40%. The left ventricle has moderately decreased function. The left ventricle demonstrates global hypokinesis. There is mild left ventricular hypertrophy. Left ventricular diastolic parameters are indeterminate.  2. Right ventricular systolic function is normal. The right ventricular size is normal.  3. The mitral valve is normal in structure. Mild mitral valve regurgitation.  4. Low flow low gradient, mild to  moderate aortic stenosis. The aortic valve is tricuspid. Aortic valve regurgitation is mild to moderate.  5. There is mild dilatation of the aortic root, measuring 42 mm. There is mild dilatation of the ascending aorta, measuring 41 mm. FINDINGS  Left Ventricle: Left ventricular ejection fraction, by estimation, is 35 to 40%. The left ventricle has moderately decreased function. The left ventricle demonstrates global hypokinesis. The left ventricular internal cavity size was normal in size. There is mild left ventricular hypertrophy. Left ventricular diastolic parameters are indeterminate. Right Ventricle: The right ventricular size is normal. No increase in right ventricular wall thickness. Right ventricular systolic function is normal. Left Atrium: Left atrial size was normal in size. Right Atrium: Right atrial size was normal in size. Pericardium: Trivial pericardial effusion is present. The pericardial effusion is anterior to the right ventricle. Mitral Valve: The mitral valve is normal in structure. Mild mitral valve regurgitation. Tricuspid Valve: The tricuspid valve is normal in structure. Tricuspid valve regurgitation is mild. Aortic Valve: Low flow low gradient, mild to moderate aortic stenosis. The aortic valve is tricuspid. Aortic valve regurgitation is mild to moderate. Aortic regurgitation PHT measures 490 msec. Aortic valve mean gradient measures 12.0 mmHg. Aortic valve peak gradient measures 22.1 mmHg. Aortic valve area, by VTI measures 1.20 cm. Pulmonic Valve: The pulmonic valve was not well visualized. Pulmonic valve regurgitation is mild. Aorta: The ascending aorta was not well visualized. There is mild dilatation of the aortic root, measuring 42 mm. There is mild dilatation of the ascending aorta, measuring 41 mm. IAS/Shunts: The atrial septum is grossly normal.  LEFT VENTRICLE PLAX 2D LVIDd:         4.60 cm LVIDs:         4.10 cm LV PW:         1.20 cm LV IVS:        1.20 cm LVOT diam:     2.30  cm LV SV:         53 LV SV Index:   30 LVOT Area:     4.15 cm  RIGHT VENTRICLE RV Basal diam:  3.50 cm RV Mid diam:    3.60 cm RV S prime:     15.30 cm/s TAPSE (M-mode): 1.8 cm LEFT ATRIUM  Index        RIGHT ATRIUM           Index LA Vol (A4C): 42.1 ml 23.65 ml/m  RA Area:     18.70 cm                                    RA Volume:   52.20 ml  29.33 ml/m  AORTIC VALVE AV Area (Vmax):    1.37 cm AV Area (Vmean):   1.36 cm AV Area (VTI):     1.20 cm AV Vmax:           235.00 cm/s AV Vmean:          161.000 cm/s AV VTI:            0.444 m AV Peak Grad:      22.1 mmHg AV Mean Grad:      12.0 mmHg LVOT Vmax:         77.40 cm/s LVOT Vmean:        52.800 cm/s LVOT VTI:          0.128 m LVOT/AV VTI ratio: 0.29 AI PHT:            490 msec  AORTA Ao Root diam: 4.20 cm Ao Asc diam:  4.10 cm TRICUSPID VALVE TR Peak grad:   25.0 mmHg TR Vmax:        250.00 cm/s  SHUNTS Systemic VTI:  0.13 m Systemic Diam: 2.30 cm Joetta Mustache Electronically signed by Joetta Mustache Signature Date/Time: 10/09/2023/9:00:43 AM    Final    DG Chest Port 1 View Result Date: 10/08/2023 CLINICAL DATA:  Status post right thoracentesis. EXAM: PORTABLE CHEST 1 VIEW COMPARISON:  Oct 07, 2023. FINDINGS: No definite pneumothorax is noted status post thoracentesis. No significant residual pleural effusion is noted. As described on prior studies. IMPRESSION: No definite pneumothorax status post thoracentesis. Electronically Signed   By: Rosalene Colon M.D.   On: 10/08/2023 13:18   US  THORACENTESIS ASP PLEURAL SPACE W/IMG GUIDE Result Date: 10/08/2023 INDICATION: 83 year old male with MRSA pneumonia, lung empyema, bilateral pleural effusions. IR was requested for diagnostic and therapeutic right-sided thoracentesis. EXAM: ULTRASOUND GUIDED DIAGNOSTIC AND THERAPEUTIC THORACENTESIS MEDICATIONS: 4 cc of 1% lidocaine  COMPLICATIONS: None immediate. PROCEDURE: An ultrasound guided thoracentesis was thoroughly discussed with the patient and  questions answered. The benefits, risks, alternatives and complications were also discussed. The patient understands and wishes to proceed with the procedure. Written consent was obtained. Ultrasound was performed to localize and mark an adequate pocket of fluid in the right chest. The area was then prepped and draped in the normal sterile fashion. 1% Lidocaine  was used for local anesthesia. Under ultrasound guidance a 6 Fr Safe-T-Centesis catheter was introduced. Thoracentesis was performed. The catheter was removed and a dressing applied. FINDINGS: A total of approximately 300 cc of clear, straw-colored fluid fluid was removed. Samples were sent to the laboratory as requested by the clinical team. IMPRESSION: Successful ultrasound guided right thoracentesis yielding 300 cc of pleural fluid. Procedure performed by Lambert Pillion, PA-C Electronically Signed   By: Fernando Hoyer M.D.   On: 10/08/2023 12:27   ECHO TEE Result Date: 10/07/2023    TRANSESOPHOGEAL ECHO REPORT   Patient Name:   DAVE MERGEN Date of Exam: 10/07/2023 Medical Rec #:  [id number]           Height:  74.0 in Accession #:    [id number]          Weight:       125.0 lb Date of Birth:  December 03, 1940           BSA:          1.780 m Patient Age:    82 years            BP:           95/62 mmHg Patient Gender: M                   HR:           51 bpm. Exam Location:  [facility] Procedure: Transesophageal Echo, Cardiac Doppler and Color Doppler (Both            Spectral and Color Flow Doppler were utilized during procedure). Indications:     Not listed on TEE check-in sheet  History:         Patient has no prior history of Echocardiogram examinations.                  Risk Factors:Hypertension.  Sonographer:     Broadus Canes Referring Phys:  [id number] CARALYN HUDSON Diagnosing Phys: Joetta Mustache PROCEDURE: After discussion of the risks and benefits of a TEE, an informed consent was obtained from the patient. The transesophogeal probe was passed without  difficulty through the esophogus of the patient. Local oropharyngeal anesthetic was provided with Cetacaine . Sedation performed by different physician. The patient was monitored while under deep sedation. Image quality was good. The patient's vital signs; including heart rate, blood pressure, and oxygen  saturation; remained stable throughout the  procedure. The patient developed no complications during the procedure.  IMPRESSIONS  1. Left ventricular ejection fraction, by estimation, is 35 to 40%. The left ventricle has moderately decreased function.  2. Right ventricular systolic function is mildly reduced. The right ventricular size is normal.  3. Left atrial size was severely dilated. No left atrial/left atrial appendage thrombus was detected.  4. The mitral valve is normal in structure. Mild mitral valve regurgitation.  5. Low flow low gradient aortic stenosis, moderate. The aortic valve is tricuspid. Aortic valve regurgitation is mild to moderate. Conclusion(s)/Recommendation(s): No evidence of vegetation/infective endocarditis on this transesophageael echocardiogram. FINDINGS  Left Ventricle: Left ventricular ejection fraction, by estimation, is 35 to 40%. The left ventricle has moderately decreased function. The left ventricular internal cavity size was normal in size. Right Ventricle: The right ventricular size is normal. No increase in right ventricular wall thickness. Right ventricular systolic function is mildly reduced. Left Atrium: Left atrial size was severely dilated. No left atrial/left atrial appendage thrombus was detected. Right Atrium: Right atrial size was normal in size. Pericardium: There is no evidence of pericardial effusion. Mitral Valve: The mitral valve is normal in structure. Mild mitral valve regurgitation. Tricuspid Valve: The tricuspid valve is normal in structure. Tricuspid valve regurgitation is mild. Aortic Valve: Low flow low gradient aortic stenosis, moderate. The aortic valve is  tricuspid. Aortic valve regurgitation is mild to moderate. Aortic valve mean gradient measures 11.5 mmHg. Aortic valve peak gradient measures 20.9 mmHg. Aortic valve area,  by VTI measures 1.40 cm. Pulmonic Valve: The pulmonic valve was normal in structure. Pulmonic valve regurgitation is trivial. Aorta: The aortic root is normal in size and structure. IAS/Shunts: No atrial level shunt detected by color flow Doppler.  LEFT VENTRICLE PLAX 2D LVOT diam:     2.60  cm LV SV:         56 LV SV Index:   32 LVOT Area:     5.31 cm  AORTIC VALVE AV Area (Vmax):    1.46 cm AV Area (Vmean):   1.34 cm AV Area (VTI):     1.40 cm AV Vmax:           228.50 cm/s AV Vmean:          155.000 cm/s AV VTI:            0.402 m AV Peak Grad:      20.9 mmHg AV Mean Grad:      11.5 mmHg LVOT Vmax:         62.70 cm/s LVOT Vmean:        39.200 cm/s LVOT VTI:          0.106 m LVOT/AV VTI ratio: 0.26  SHUNTS Systemic VTI:  0.11 m Systemic Diam: 2.60 cm Joetta Mustache Electronically signed by Joetta Mustache Signature Date/Time: 10/07/2023/2:00:53 PM    Final    DG Chest Port 1 View Result Date: 10/07/2023 CLINICAL DATA:  Weakness and fever.  Pleural effusion. EXAM: PORTABLE CHEST 1 VIEW COMPARISON:  CT chest, abdomen, and pelvis dated 10/06/2023. Chest radiograph dated 09/30/2023. FINDINGS: The heart size and mediastinal contours are unchanged. Aortic atherosclerosis. Large cavitary lesion is again noted in the right lung with surrounding airspace opacities. Similar small bilateral pleural effusions with associated bibasilar atelectasis. Unchanged large hiatal hernia. No pneumothorax. No acute osseous abnormality. IMPRESSION: 1. Large cavitary lesion is again noted in the right lung with surrounding airspace opacities, concerning for cavitary pneumonia, better evaluated on the prior CT chest, abdomen, and pelvis dated 10/06/2023. 2. Similar small bilateral pleural effusions with associated bibasilar atelectasis. Electronically Signed   By:  Mannie Seek M.D.   On: 10/07/2023 12:42   CT CHEST ABDOMEN PELVIS W CONTRAST Result Date: 10/06/2023 CLINICAL DATA:  Worsening leukocytosis and transaminitis despite antibiotic treatment. Unintentional weight loss. Known cavitary pneumonia. Clinical concern for possible abscess or malignancy. EXAM: CT CHEST, ABDOMEN, AND PELVIS WITH CONTRAST TECHNIQUE: Multidetector CT imaging of the chest, abdomen and pelvis was performed following the standard protocol during bolus administration of intravenous contrast. RADIATION DOSE REDUCTION: This exam was performed according to the departmental dose-optimization program which includes automated exposure control, adjustment of the mA and/or kV according to patient size and/or use of iterative reconstruction technique. CONTRAST:  80mL OMNIPAQUE  IOHEXOL  300 MG/ML  SOLN COMPARISON:  Chest CT dated 10/01/2023. Abdomen and pelvis CT dated 08/08/2023. FINDINGS: CT CHEST FINDINGS Cardiovascular: There is a moderate-sized pulmonary embolus in the distal left main pulmonary artery and extending into the proximal left upper lobe pulmonary artery without occlusion. A smaller embolus is demonstrated in a right lower lobe pulmonary artery branch. These are not sufficient to cause right heart strain. The previously demonstrated 4.5 cm ascending thoracic aorta aneurysm currently measures 4.3 cm. Enlarged central pulmonary arteries with a main pulmonary artery measuring 3.6 cm in diameter. Enlarged heart. Atheromatous calcifications, including the coronary arteries and aorta. Slight increase in size of a small pericardial effusion with a maximum thickness of 8 mm. Mediastinum/Nodes: No enlarged mediastinal, hilar, or axillary lymph nodes. Thyroid  gland, trachea, and esophagus demonstrate no significant findings. Again demonstrated is a large hiatal hernia with an intrathoracic stomach. Lungs/Pleura: Increased size and confluence of the large cavitary lesion previously demonstrated in  the right upper lobe with a moderately thick, irregular peripheral rind and adjacent  airspace opacity. This continues to contain some fluid, with mild improvement. This area currently measures 13.2 x 8.2 cm on image number 94/4, previously 8.6 x 6.5 cm in corresponding dimensions. Continued patchy airspace opacity in the adjacent portions of the right upper lobe. These areas have increased some in size and decreased some in density. The previously demonstrated right middle lobe nodular density has cavitated with 2 adjacent areas of cavitation and patchy opacity currently demonstrated in that area. Small to moderate-sized bilateral pleural effusions, increased on the right and without significant change on the left. Mild bilateral lower lobe atelectasis. Musculoskeletal: Mild thoracic spine degenerative changes. No evidence of bony metastatic disease. CT ABDOMEN PELVIS FINDINGS Hepatobiliary: No focal liver abnormality is seen. No gallstones, gallbladder wall thickening, or biliary dilatation. Pancreas: Unremarkable. No pancreatic ductal dilatation or surrounding inflammatory changes. Spleen: Normal in size without focal abnormality. Adrenals/Urinary Tract: Moderate dilatation of the left renal collecting system and ureter with mild progression mild dilatation of the right renal collecting system and ureter with mild improvement. Again demonstrated is asymmetrical anterior bladder wall thickening, thicker on the right, with improvement. A suprapubic bladder catheter is currently demonstrated. Stable fat density left adrenal myelolipoma and probable lipid poor left adrenal adenoma. Unremarkable right adrenal gland. Stomach/Bowel: Extensive sigmoid and descending colon diverticulosis without evidence of diverticulitis. The appendix is not visualized. Again noted is the entire stomach within a large hiatal hernia. Unremarkable small bowel. Vascular/Lymphatic: Atheromatous arterial calcifications without aneurysm. No  enlarged lymph nodes. Reproductive: Mildly enlarged and heterogeneous prostate gland with coarse calcifications. Other: No abdominal wall hernia or abnormality. No abdominopelvic ascites. Musculoskeletal: Moderate lumbar spine degenerative changes and mild scoliosis. IMPRESSION: 1. Bilateral pulmonary emboli, as described above. These are not sufficient to cause right heart strain. 2. Increased size and confluence of the large cavitary lesion previously demonstrated in the right upper lobe with a moderately thick, irregular peripheral rind and adjacent airspace opacity. This continues to contain some fluid, with mild improvement. This is most likely due to a cavitary pneumonia. A cavitary neoplasm is less likely since it was not present on the abdomen and pelvis CT obtained on 08/08/2023. 3. Continued patchy airspace opacity in the adjacent portions of the right upper lobe. These areas have increased some in size and decreased some in density, compatible with ongoing pneumonia. 4. The previously demonstrated right middle lobe nodular density has cavitated with 2 adjacent areas of cavitation and patchy opacity currently demonstrated in that area, also compatible with cavitary pneumonia. 5. Small to moderate-sized bilateral pleural effusions, increased on the right and without significant change on the left. 6. Mild bilateral lower lobe atelectasis. 7. Slight increase in size of a small pericardial effusion. 8. 4.3 cm ascending thoracic aorta aneurysm. Recommend annual imaging followup by CTA or MRA. This recommendation follows 2010 ACCF/AHA/AATS/ACR/ASA/SCA/SCAI/SIR/STS/SVM Guidelines for the Diagnosis and Management of Patients with Thoracic Aortic Disease. Circulation. 2010; 121: Z610-R604. Aortic aneurysm NOS (ICD10-I71.9) 9. Enlarged central pulmonary arteries, suggesting pulmonary arterial hypertension. 10. Cardiomegaly. 11. Large hiatal hernia with an intrathoracic stomach. 12. Moderate dilatation of the left  renal collecting system and ureter with mild progression. Mild dilatation of the right renal collecting system and ureter with mild improvement. This remains compatible with bilateral ureteral obstruction at the level of the ureterovesical junction 13. Asymmetrical anterior bladder wall thickening, thicker on the right, with improvement. A suprapubic bladder catheter is currently demonstrated. 14. Stable left adrenal myelolipoma and probable lipid poor adenoma. 15. Extensive sigmoid and descending colon diverticulosis  without evidence of diverticulitis. 16. Mildly enlarged and heterogeneous prostate gland. 17.  Calcific coronary artery and aortic atherosclerosis. Aortic Atherosclerosis (ICD10-I70.0). Critical Value/emergent results were called by telephone at the time of interpretation on 10/06/2023 at 2:14 pm to provider Melodi Sprung , who verbally acknowledged these results. Electronically Signed   By: Catherin Closs M.D.   On: 10/06/2023 14:32   CT CHEST WO CONTRAST Result Date: 10/01/2023 CLINICAL DATA:  Weakness, fever, abnormal x-ray EXAM: CT CHEST WITHOUT CONTRAST TECHNIQUE: Multidetector CT imaging of the chest was performed following the standard protocol without IV contrast. RADIATION DOSE REDUCTION: This exam was performed according to the departmental dose-optimization program which includes automated exposure control, adjustment of the mA and/or kV according to patient size and/or use of iterative reconstruction technique. COMPARISON:  03/04/2022, 08/08/2023, 09/30/2023 FINDINGS: Cardiovascular: Unenhanced imaging of the heart is unremarkable, without significant pericardial effusion. 4.5 cm ascending thoracic aortic aneurysm. Atherosclerosis of the aorta and coronary vasculature. Assessment of the vascular lumen cannot be performed without intravenous contrast. Mediastinum/Nodes: No enlarged mediastinal or axillary lymph nodes. Thyroid  gland, trachea, and esophagus demonstrate no significant  findings. Moderate hiatal hernia. Lungs/Pleura: There is a thick walled right upper lobe cavity measuring 9.2 x 7.2 x 7.5 cm, with gas fluid level. There is surrounding dense airspace disease within the right upper lobe as well as within the right middle lobe. Findings are most consistent with cavitary pneumonia, new since the 08/08/2023 CT. There are small free-flowing bilateral pleural effusions, right greater than left. Dependent consolidation within the lower lobes most consistent with atelectasis. No pneumothorax. Central airways are patent. Upper Abdomen: Upper abdominal ascites. Musculoskeletal: No acute or destructive bony abnormalities. Reconstructed images demonstrate no additional findings. IMPRESSION: 1. Large thick walled right upper lobe cavity with gas fluid level, with dense airspace disease involving the right upper and right middle lobes, most consistent with cavitating pneumonia. This has developed since the abdominal CT 08/08/2023. 2. Small free-flowing bilateral pleural effusions. 3. Moderate hiatal hernia. 4. 4.5 cm ascending thoracic aortic aneurysm. Ascending thoracic aortic aneurysm. Recommend semi-annual imaging followup by CTA or MRA and referral to cardiothoracic surgery if not already obtained. This recommendation follows 2010 ACCF/AHA/AATS/ACR/ASA/SCA/SCAI/SIR/STS/SVM Guidelines for the Diagnosis and Management of Patients With Thoracic Aortic Disease. Circulation. 2010; 121: [id number]. Aortic aneurysm NOS (ICD10-I71.9) 5.  Aortic Atherosclerosis (ICD10-I70.0). 6. Upper abdominal ascites. Electronically Signed   By: Bobbye Burrow M.D.   On: 10/01/2023 20:12   DG Chest Port 1 View Result Date: 09/30/2023 CLINICAL DATA:  83 year old male with possible sepsis. EXAM: PORTABLE CHEST 1 VIEW COMPARISON:  Chest CT 03/04/2022 and earlier. FINDINGS: Portable AP supine views at 0600 hours. Chronic retrocardiac moderate to large gastric hiatal hernia. Stable cardiac size and mediastinal  contours. Chronic pulmonary hyperinflation. Patchy and indistinct new multifocal right lung opacity in both the upper and lower lung. No superimposed pneumothorax, pulmonary edema, pleural effusion. No definite left lung involvement. No acute osseous abnormality identified. Nondilated gas-filled bowel in the upper abdomen. IMPRESSION: 1. Chronic pulmonary hyperinflation with indistinct new multifocal right lung opacity suspicious for developing multilobar bronchopneumonia/pneumonia in this setting. No pleural effusion. 2. Chronic moderate to large hiatal hernia. Electronically Signed   By: Marlise Simpers M.D.   On: 09/30/2023 06:25   CT Cervical Spine Wo Contrast Result Date: 09/30/2023 CLINICAL DATA:  83 year old male status post fall in garage ester day. Generalized weakness, decreased p.o. Hematuria. Shortness of breath. EXAM: CT CERVICAL SPINE WITHOUT CONTRAST TECHNIQUE: Multidetector CT imaging of the cervical  spine was performed without intravenous contrast. Multiplanar CT image reconstructions were also generated. RADIATION DOSE REDUCTION: This exam was performed according to the departmental dose-optimization program which includes automated exposure control, adjustment of the mA and/or kV according to patient size and/or use of iterative reconstruction technique. COMPARISON:  Head CT today. FINDINGS: Alignment: Maintained cervical lordosis. Cervicothoracic junction alignment is within normal limits. Bilateral posterior element alignment is within normal limits. Skull base and vertebrae: Bone mineralization is within normal limits for age. Visualized skull base is intact. No atlanto-occipital dissociation. C1 and C2 appear intact and aligned. No acute osseous abnormality identified. Soft tissues and spinal canal: No prevertebral fluid or swelling. No visible canal hematoma. Bulky calcified carotid atherosclerosis in the neck, severe on the right (series 3, image 58). Disc levels: Generally mild for age cervical  spine degeneration and capacious CT appearance of the cervical spinal canal at most levels. Upper chest: Visible upper thoracic levels appear intact. Clear lung apices with questionable emphysema. Trace intravenous gas at the thoracic inlet, likely IV access related. IMPRESSION: 1. No acute traumatic injury identified in the cervical spine. Mild for age cervical spine degeneration. 2. Calcified carotid atherosclerosis in the neck, Severe at the Right ICA. Electronically Signed   By: Marlise Simpers M.D.   On: 09/30/2023 05:57   CT HEAD WO CONTRAST ( ) Result Date: 09/30/2023 CLINICAL DATA:  83 year old male status post fall in garage ester day. Generalized weakness, decreased p.o. Hematuria. Shortness of breath. EXAM: CT HEAD WITHOUT CONTRAST TECHNIQUE: Contiguous axial images were obtained from the base of the skull through the vertex without intravenous contrast. RADIATION DOSE REDUCTION: This exam was performed according to the departmental dose-optimization program which includes automated exposure control, adjustment of the mA and/or kV according to patient size and/or use of iterative reconstruction technique. COMPARISON:  Brain MRI 04/12/2020.  Head CT 01/20/2017. FINDINGS: Brain: Cerebral volume stable and within normal limits for age. Choroid plexus cysts, normal variant. No midline shift, ventriculomegaly, mass effect, evidence of mass lesion, intracranial hemorrhage or evidence of cortically based acute infarction. Gray-white differentiation stable and within normal limits for age. Vascular: Advanced calcified atherosclerosis at the skull base. No suspicious intracranial vascular hyperdensity. Skull: Stable, intact. Sinuses/Orbits: Visualized paranasal sinuses and mastoids are clear. Other: No acute orbit or scalp soft tissue injury identified. Stable postoperative changes to the globes. IMPRESSION: 1. No acute intracranial abnormality or acute traumatic injury identified. 2. Stable and negative for age  noncontrast CT appearance of the brain. Electronically Signed   By: Marlise Simpers M.D.   On: 09/30/2023 05:55   IR CYSTOSTOMY TUBE PLACEMENT/BLADDER ASPIRATION Result Date: 08/26/2023 INDICATION: 83 year old male presents for suprapubic catheter placement EXAM: IMAGE GUIDED SUPRAPUBIC CATHETER PLACEMENT COMPARISON:  CT 08/08/2023 MEDICATIONS: None ANESTHESIA/SEDATION: Moderate (conscious) sedation was employed during this procedure. A total of Versed  1.0 mg and Fentanyl  50 mcg was administered intravenously by the radiology nurse. Total intra-service moderate Sedation Time: 14 minutes. The patient's level of consciousness and vital signs were monitored continuously by radiology nursing throughout the procedure under my direct supervision. CONTRAST:  5 cc-administered into the collecting system(s) FLUOROSCOPY: Radiation Exposure Index (as provided by the fluoroscopic device): 1 mGy Kerma COMPLICATIONS: None PROCEDURE: Informed written consent was obtained from the patient after a thorough discussion of the procedural risks, benefits and alternatives. All questions were addressed. Maximal Sterile Barrier Technique was utilized including caps, mask, sterile gowns, sterile gloves, sterile drape, hand hygiene and skin antiseptic. A timeout was performed prior to the initiation  of the procedure. Patient was positioned supine under the image intensifier. The pelvic region was prepped and draped in the usual sterile fashion. Ultrasound images were performed with images stored sent to PACs. 1% lidocaine  was used for local anesthesia of the skin and subcutaneous tissues. Small stab incision was made with 11 blade scalpel and blunt dissection was performed. Under ultrasound 18 gauge trocar needle was advanced into the urinary bladder in the midline. We confirmed needle tip position the stylet was removed and a wire was advanced into the bladder under ultrasound guidance. Needle was removed. Over the wire, a coaxial system of a 7  mm diameter, 80 mm length balloon and a 16 French [place] tip balloon retention urinary catheter advanced to the soft tissues. The balloon was gently inflated to profile. Once gentle dilation was performed the balloon was deflated and some attain E asleep the balloon and Council tip catheter were advanced into the urinary bladder under fluoroscopic guidance on the wire. Balloon was completely deflated and the balloon catheter was removed with the wire. 8 cc of saline used to inflate the new Council tip balloon retention catheter, which was withdrawn to the bladder wall. Contrast was injected confirming location with images stored and sent to PACs. The indwelling urinary catheter was then removed. Sterile dressing placed. Patient tolerated the procedure well and remained hemodynamically stable throughout. No complications were encountered and no significant blood loss. IMPRESSION: Status post image guided placement of suprapubic 16 French balloon retention Council tip catheter. Signed, Marciano Settles. Dalene Duck, ABVM, RPVI [facility] Radiology PLAN: After approximately 8 weeks to allow the tract to mature, bedside catheter exchange may proceed. Electronically Signed   By: Myrlene Asper D.O.   On: 08/26/2023 13:10   CT ABDOMEN PELVIS WO CONTRAST Result Date: 08/08/2023 CLINICAL DATA:  Peritonitis or perforation suspected. Abdominal pain. EXAM: CT ABDOMEN AND PELVIS WITHOUT CONTRAST TECHNIQUE: Multidetector CT imaging of the abdomen and pelvis was performed following the standard protocol without IV contrast. RADIATION DOSE REDUCTION: This exam was performed according to the departmental dose-optimization program which includes automated exposure control, adjustment of the mA and/or kV according to patient size and/or use of iterative reconstruction technique. COMPARISON:  CT abdomen pelvis without contrast 08/05/2023, CT abdomen and pelvis with contrast 02/22/2022.  FINDINGS: Lower chest: There are emphysematous and scarring changes in both lung bases. Small layering left pleural effusion again is noted, mild bronchial thickening in the lower lobes without infiltrates. There is a 9 x 8 mm right middle lobe nodule again noted new from 2023. Large hiatal hernia with intrathoracic stomach. The cardiac size is normal. There are coronary artery calcifications. Small pericardial effusion anteriorly. Hepatobiliary: No focal liver abnormality is seen without contrast. No calcified gallstones, gallbladder wall thickening, or biliary dilatation. Pancreas: No abnormality is seen without contrast. Spleen: No abnormality is seen without contrast. Adrenals/Urinary Tract: Stable 1.4 cm left adrenal myelolipoma. Stable 1.5 cm left adrenal genu nodule, above the typical density of an adenoma but stable in size and most likely a lipid poor adenoma. Normal right adrenal gland. There is a 2.6 cm Bosniak 1 cyst in the upper pole of the right kidney, Hounsfield density is 13. In the left upper pole, there is a Bosniak 1 cyst measuring 1.5 cm, 17 Hounsfield units. There is bilateral cortical thinning and moderate but improved hydroureteronephrosis. There is a small volume of perinephric fluid underlying the left kidney inferiorly, previously greater. There was previously right perinephric fluid which  has cleared. Perinephric stranding is seen greater on the left but has also improved. The bladder has been catheterized since the prior study which showed severe bladder dilatation. A large diverticulum of the posterior wall is again noted and partially decompressed. There is mild distention of the bladder today, reaching up to the level of L5-S1, but this has markedly improved. There is wall thickening in the bladder eccentric anteriorly and to the right, unclear if this is muscular hypertrophy, inflammatory or infiltrating. Small amount of air in the anterior bladder. Cystoscopy recommended.  Stomach/Bowel: No dilatation or wall thickening. Moderate fecal stasis. Large hiatal hernia as above. Advanced sigmoid diverticulosis without evidence of diverticulitis. An appendix is not seen. Vascular/Lymphatic: Aortic atherosclerosis. No enlarged abdominal or pelvic lymph nodes. Reproductive: Mild prostatomegaly with dystrophic calcifications. Other: Previously there was mesenteric congestion and body wall edema which have resolved. There is trace posterior deep pelvic ascites. There is no free hemorrhage, free air or incarcerated hernia. Musculoskeletal: Degenerative change and mild levoscoliosis lumbar spine, apex L1-2. Osteopenia. No acute or other significant osseous findings. IMPRESSION: 1. The bladder has been catheterized since the prior study which showed severe bladder dilatation. There is mild distention of the bladder today, reaching up to the level of L5-S1, but this has markedly improved. 2. There is wall thickening in the bladder eccentric anteriorly and to the right, unclear if this is muscular hypertrophy, inflammatory or infiltrating. Small amount of air in the anterior bladder. Cystoscopy recommended. 3. Moderate but improved bilateral hydroureteronephrosis. 4. Small volume of perinephric fluid underlying the left kidney inferiorly, previously greater. Prior right perinephric fluid has cleared. 5. Bilateral renal cysts. 6. Constipation and diverticulosis. 7. Large hiatal hernia with intrathoracic stomach. 8. Aortic and coronary artery atherosclerosis. 9. Small left pleural effusion, unchanged. 10. 9 x 8 mm right middle lobe nodule new from 2023. Per Fleischner Society Guidelines, recommend prompt non-contrast Chest CT for further evaluation. These guidelines do not apply to immunocompromised patients and patients with cancer. Follow up in patients with significant comorbidities as clinically warranted. For lung cancer screening, adhere to Lung-RADS guidelines. Reference: Radiology. 2017;  284(1):228-43. 11. Osteopenia and degenerative change. Aortic Atherosclerosis (ICD10-I70.0) and Emphysema (ICD10-J43.9). Electronically Signed   By: Denman Fischer M.D.   On: 08/08/2023 04:53     Assessment and plan-   # Thrombocytopenia, He has a history of thrombocytosis from 10/08/2023 - 10/14/2023 likely reactive secondary to necrotizing pneumonia.  During current admission, 119,000-->83,000--> 68,000.   Etiology of the thrombocytopenia, likely multifactorial.  Partially secondary to marrow suppression from recent linezolid  use and infection/consumption. Prior suppression from linezolid  usually recovers from 1 to 2 weeks after stopping medication. Continue monitor.  # Anemia, adequate B12 and folate.  No M protein on SPEP, normal light chain ratio, Iron  panel is consistent with anemia due to chronic disease/CKD.  Check TSH -elevated, will check free T4  # Pulmonary embolism, currently off Eliquis  due to anemia, thrombocytopenia, potential GI bleeding risk.  Thank you for allowing me to participate in the care of this patient.   [name], MD, PhD Hematology Oncology 11/03/2023

## 2023-11-04 DIAGNOSIS — L8990 Pressure ulcer of unspecified site, unspecified stage: Secondary | ICD-10-CM | POA: Diagnosis not present

## 2023-11-04 DIAGNOSIS — K3189 Other diseases of stomach and duodenum: Secondary | ICD-10-CM | POA: Diagnosis present

## 2023-11-04 DIAGNOSIS — I2694 Multiple subsegmental pulmonary emboli without acute cor pulmonale: Secondary | ICD-10-CM | POA: Diagnosis present

## 2023-11-04 DIAGNOSIS — K5939 Other megacolon: Secondary | ICD-10-CM | POA: Diagnosis present

## 2023-11-04 DIAGNOSIS — D509 Iron deficiency anemia, unspecified: Secondary | ICD-10-CM | POA: Diagnosis not present

## 2023-11-04 DIAGNOSIS — J189 Pneumonia, unspecified organism: Secondary | ICD-10-CM | POA: Diagnosis not present

## 2023-11-04 DIAGNOSIS — I130 Hypertensive heart and chronic kidney disease with heart failure and stage 1 through stage 4 chronic kidney disease, or unspecified chronic kidney disease: Secondary | ICD-10-CM | POA: Diagnosis present

## 2023-11-04 DIAGNOSIS — I2699 Other pulmonary embolism without acute cor pulmonale: Secondary | ICD-10-CM | POA: Diagnosis not present

## 2023-11-04 DIAGNOSIS — Z66 Do not resuscitate: Secondary | ICD-10-CM | POA: Diagnosis present

## 2023-11-04 DIAGNOSIS — I2585 Chronic coronary microvascular dysfunction: Secondary | ICD-10-CM | POA: Diagnosis not present

## 2023-11-04 DIAGNOSIS — E43 Unspecified severe protein-calorie malnutrition: Secondary | ICD-10-CM | POA: Diagnosis present

## 2023-11-04 DIAGNOSIS — D631 Anemia in chronic kidney disease: Secondary | ICD-10-CM | POA: Diagnosis present

## 2023-11-04 DIAGNOSIS — Z7189 Other specified counseling: Secondary | ICD-10-CM | POA: Diagnosis not present

## 2023-11-04 DIAGNOSIS — K222 Esophageal obstruction: Secondary | ICD-10-CM | POA: Diagnosis present

## 2023-11-04 DIAGNOSIS — E785 Hyperlipidemia, unspecified: Secondary | ICD-10-CM | POA: Diagnosis not present

## 2023-11-04 DIAGNOSIS — D649 Anemia, unspecified: Secondary | ICD-10-CM | POA: Diagnosis present

## 2023-11-04 DIAGNOSIS — Z515 Encounter for palliative care: Secondary | ICD-10-CM | POA: Diagnosis not present

## 2023-11-04 DIAGNOSIS — E871 Hypo-osmolality and hyponatremia: Secondary | ICD-10-CM | POA: Diagnosis present

## 2023-11-04 DIAGNOSIS — D696 Thrombocytopenia, unspecified: Secondary | ICD-10-CM | POA: Diagnosis present

## 2023-11-04 DIAGNOSIS — J984 Other disorders of lung: Secondary | ICD-10-CM | POA: Diagnosis not present

## 2023-11-04 DIAGNOSIS — D500 Iron deficiency anemia secondary to blood loss (chronic): Secondary | ICD-10-CM | POA: Diagnosis present

## 2023-11-04 DIAGNOSIS — N184 Chronic kidney disease, stage 4 (severe): Secondary | ICD-10-CM | POA: Diagnosis present

## 2023-11-04 DIAGNOSIS — I5022 Chronic systolic (congestive) heart failure: Secondary | ICD-10-CM | POA: Diagnosis present

## 2023-11-04 DIAGNOSIS — Z681 Body mass index (BMI) 19 or less, adult: Secondary | ICD-10-CM | POA: Diagnosis not present

## 2023-11-04 DIAGNOSIS — J188 Other pneumonia, unspecified organism: Secondary | ICD-10-CM | POA: Diagnosis present

## 2023-11-04 DIAGNOSIS — K922 Gastrointestinal hemorrhage, unspecified: Secondary | ICD-10-CM | POA: Diagnosis not present

## 2023-11-04 DIAGNOSIS — K5730 Diverticulosis of large intestine without perforation or abscess without bleeding: Secondary | ICD-10-CM | POA: Diagnosis present

## 2023-11-04 DIAGNOSIS — E039 Hypothyroidism, unspecified: Secondary | ICD-10-CM | POA: Diagnosis present

## 2023-11-04 LAB — MAGNESIUM: Magnesium: 1.8 mg/dL (ref 1.7–2.4)

## 2023-11-04 LAB — CBC WITH DIFFERENTIAL/PLATELET
Abs Immature Granulocytes: 0.05 10*3/uL (ref 0.00–0.07)
Basophils Absolute: 0 10*3/uL (ref 0.0–0.1)
Basophils Relative: 1 %
Eosinophils Absolute: 0.2 10*3/uL (ref 0.0–0.5)
Eosinophils Relative: 5 %
HCT: 21.5 % — ABNORMAL LOW (ref 39.0–52.0)
Hemoglobin: 7.2 g/dL — ABNORMAL LOW (ref 13.0–17.0)
Immature Granulocytes: 1 %
Lymphocytes Relative: 17 %
Lymphs Abs: 0.6 10*3/uL — ABNORMAL LOW (ref 0.7–4.0)
MCH: 31.9 pg (ref 26.0–34.0)
MCHC: 33.5 g/dL (ref 30.0–36.0)
MCV: 95.1 fL (ref 80.0–100.0)
Monocytes Absolute: 0.2 10*3/uL (ref 0.1–1.0)
Monocytes Relative: 6 %
Neutro Abs: 2.6 10*3/uL (ref 1.7–7.7)
Neutrophils Relative %: 70 %
Platelets: 61 10*3/uL — ABNORMAL LOW (ref 150–400)
RBC: 2.26 MIL/uL — ABNORMAL LOW (ref 4.22–5.81)
RDW: 14.3 % (ref 11.5–15.5)
WBC: 3.6 10*3/uL — ABNORMAL LOW (ref 4.0–10.5)
nRBC: 0 % (ref 0.0–0.2)

## 2023-11-04 LAB — COMPREHENSIVE METABOLIC PANEL WITH GFR
ALT: 22 U/L (ref 0–44)
AST: 21 U/L (ref 15–41)
Albumin: 2.1 g/dL — ABNORMAL LOW (ref 3.5–5.0)
Alkaline Phosphatase: 73 U/L (ref 38–126)
Anion gap: 6 (ref 5–15)
BUN: 32 mg/dL — ABNORMAL HIGH (ref 8–23)
CO2: 25 mmol/L (ref 22–32)
Calcium: 8 mg/dL — ABNORMAL LOW (ref 8.9–10.3)
Chloride: 103 mmol/L (ref 98–111)
Creatinine, Ser: 1.75 mg/dL — ABNORMAL HIGH (ref 0.61–1.24)
GFR, Estimated: 39 mL/min — ABNORMAL LOW (ref >60)
Glucose, Bld: 80 mg/dL (ref 70–99)
Potassium: 4.6 mmol/L (ref 3.5–5.1)
Sodium: 134 mmol/L — ABNORMAL LOW (ref 135–145)
Total Bilirubin: 0.6 mg/dL (ref 0.0–1.2)
Total Protein: 4.5 g/dL — ABNORMAL LOW (ref 6.5–8.1)

## 2023-11-04 LAB — FIBRINOGEN: Fibrinogen: 348 mg/dL (ref 210–475)

## 2023-11-04 LAB — RETIC PANEL
Immature Retic Fract: 1.5 % — ABNORMAL LOW (ref 2.3–15.9)
RBC.: 2.28 MIL/uL — ABNORMAL LOW (ref 4.22–5.81)
Retic Count, Absolute: 4.8 10*3/uL — ABNORMAL LOW (ref 19.0–186.0)
Retic Ct Pct: <0.4 % — ABNORMAL LOW (ref 0.4–3.1)
Reticulocyte Hemoglobin: 36.4 pg (ref >27.9)

## 2023-11-04 LAB — APTT: aPTT: 40 s — ABNORMAL HIGH (ref 24–36)

## 2023-11-04 LAB — T4, FREE: Free T4: 0.35 ng/dL — ABNORMAL LOW (ref 0.61–1.12)

## 2023-11-04 LAB — SURGICAL PATHOLOGY

## 2023-11-04 LAB — PHOSPHORUS: Phosphorus: 1.8 mg/dL — ABNORMAL LOW (ref 2.5–4.6)

## 2023-11-04 MED ORDER — K PHOS MONO-SOD PHOS DI & MONO 155-852-130 MG PO TABS
500.0000 mg | ORAL_TABLET | ORAL | Status: AC
  Administered 2023-11-04 (×2): 500 mg via ORAL
  Filled 2023-11-04 (×2): qty 2

## 2023-11-04 MED ORDER — LEVOTHYROXINE SODIUM 50 MCG PO TABS
50.0000 ug | ORAL_TABLET | Freq: Every day | ORAL | Status: DC
  Administered 2023-11-04 – 2023-11-05 (×2): 50 ug via ORAL
  Filled 2023-11-04 (×2): qty 1

## 2023-11-04 MED ORDER — LEVOTHYROXINE SODIUM 50 MCG PO TABS: 25.0000 ug | ORAL_TABLET | Freq: Every day | ORAL | Status: DC

## 2023-11-04 NOTE — Plan of Care (Signed)

## 2023-11-04 NOTE — Progress Notes (Signed)
 Palliative Care Progress Note, Assessment & Plan   Patient Name: Zachary [name]       Date: 11/04/2023 DOB: 1940-09-11  Age: 83 y.o. MRN#: [id number] Attending Physician: Dezii, Alexandra, DO Primary Care Physician: [name], MD Admit Date: 10/30/2023  Subjective: Patient is lying in bed, asleep, but easily awakens to my presence.  He is able to acknowledge my presence and make his wishes known.  No family or friends present during my visit.  HPI: 83 y.o. male  with past medical history of hypotension (midodrine ), CAD, systolic CHF, HLD, CKD (stage IV), anemia, PE (Eliquis ), ESBL UTI, MRSA bacteremia due to cavitary pneumonia (linezolid ), and urinary retention s/p L4 suprapubic catheter placement admitted on 10/30/2023 with shortness of breath.   Of note, patient was hospitalized for MRSA bacteria secondary to cavitary pneumonia (4/29 - 5/13) and is being followed by Dr. Francee Inch of [facility].    PTA, patient endorsed he had labs in his facility which revealed hemoglobin of 6.8.  On arrival to ED, hemoglobin was 6.4.   Patient is being treated for symptomatic anemia, chronic systolic congestive heart failure, thrombocytopenia, and protein calorie malnutrition.   PMT was consulted to support family and patient with goals of care discussions.  Of note, patient is familiar to PMT and this provider has not followed Mr. Moree during his previous hospitalization.  Summary of counseling/coordination of care: Extensive chart review completed prior to meeting patient including labs, vital signs, imaging, progress notes, orders, and available advanced directive documents from current and previous encounters.   After reviewing the patient's chart and assessing the patient at bedside, I spoke with patient  in regards to symptom management and goals of care.   Symptoms assessed.  Patient has no acute complaints at this time.  He denies headache, chest pain, N/V, or other acute ailments at this time.  No adjustment to [facility] needed.  I attempted to elicit values and goals imported to the patient.  He shares that he wants to get better so that he can be with his wife of 60 years.  He shares that he is feeling better with plans to get up and walk around later.  Discussed importance of functional, nutritional, and cognitive status as significant indicators of patient's overall prognosis.  I again discussed continue with current plan of care to treat the treatable.  Patient shares that he wants to continue along "this path to wellness".  He remains hopeful that he can gain strength to return to SNF and be with his wife.  No adjustment to plan of care at this time.  PMT will continue to follow and support.  Physical Exam Vitals reviewed.  Constitutional:      General: He is not in acute distress.    Comments: Thin, frail, elderly  HENT:     Mouth/Throat:     Mouth: Mucous membranes are moist.  Eyes:     Pupils: Pupils are equal, round, and reactive to light.  Pulmonary:     Effort: Pulmonary effort is normal.  Abdominal:     Palpations: Abdomen is soft.  Musculoskeletal:     Comments: Generalized weakness  Skin:    General: Skin is warm  and dry.  Neurological:     Mental Status: He is alert and oriented to person, place, and time.  Psychiatric:        Mood and Affect: Mood normal.        Behavior: Behavior normal.        Thought Content: Thought content normal.        Judgment: Judgment normal.             Total Time 25 minutes   Time spent includes: Detailed review of medical records (labs, imaging, vital signs), medically appropriate exam (mental status, respiratory, cardiac, skin), discussed with treatment team, counseling and educating patient, family and staff, documenting clinical  information, medication management and coordination of care.  Judeen Nose L. Rebbeca Campi, DNP, FNP-BC Palliative Medicine Team

## 2023-11-04 NOTE — Progress Notes (Signed)
 Physical Therapy Treatment Patient Details Name: Zachary [name] MRN: [id number] DOB: June 30, 1940 Today's Date: 11/04/2023   History of Present Illness Pt is an 83 y.o. male presenting to hospital 10/30/23 with c/o weakness and SOB.  Pt admitted with symptomatic anemia and iron  deficiency anemia.  S/p colonoscopy and upper GI endoscopy 11/02/23.  PMH includes htn, HLD, CAD, prior NSTEMI, thoracic aortic aneurysm, hospitalization April 2025 for sepsis secondary to PNA, ESBL, UTI's, h/o PE, hypotension on midodrine , CHF, suprapubic catheter placement, MRSA bacteremia d/t cavitary PNA, vertigo.    PT Comments  Pt resting in bed upon PT arrival; pt agreeable to therapy.  During session pt was SBA semi-supine to sitting EOB; min assist to stand from bed; and CGA to ambulate around nursing loop with RW use.  Pt reporting his wife is having knee surgery tomorrow.  Will continue to focus on strengthening, balance, and progressive functional mobility during hospitalization.    If plan is discharge home, recommend the following: A little help with walking and/or transfers;A little help with bathing/dressing/bathroom;Assistance with cooking/housework;Assist for transportation;Help with stairs or ramp for entrance   Can travel by private vehicle        Equipment Recommendations  Rolling walker (2 wheels);BSC/3in1    Recommendations for Other Services       Precautions / Restrictions Precautions Precautions: Fall Recall of Precautions/Restrictions: Intact Precaution/Restrictions Comments: suprapubic cath; very fragile skin Restrictions Weight Bearing Restrictions Per Provider Order: No     Mobility  Bed Mobility Overal bed mobility: Needs Assistance Bed Mobility: Supine to Sit     Supine to sit: Supervision     General bed mobility comments: SBA for safety    Transfers Overall transfer level: Needs assistance Equipment used: Rolling walker (2 wheels) Transfers: Sit to/from Stand Sit  to Stand: Min assist           General transfer comment: vc's for UE positioning (to push off of bed to stand up and reach back for chair arm)    Ambulation/Gait Ambulation/Gait assistance: Contact guard assist Gait Distance (Feet): 200 Feet Assistive device: Rolling walker (2 wheels) Gait Pattern/deviations: Step-through pattern Gait velocity: decreased         Stairs             Wheelchair Mobility     Tilt Bed    Modified Rankin (Stroke Patients Only)       Balance Overall balance assessment: Needs assistance Sitting-balance support: No upper extremity supported, Feet supported Sitting balance-Leahy Scale: Good Sitting balance - Comments: steady reaching within BOS   Standing balance support: Bilateral upper extremity supported, Reliant on assistive device for balance, During functional activity Standing balance-Leahy Scale: Fair Standing balance comment: steady static standing with B UE support on RW                            Communication Communication Communication: Impaired Factors Affecting Communication: Hearing impaired  Cognition Arousal: Alert Behavior During Therapy: WFL for tasks assessed/performed   PT - Cognitive impairments: No apparent impairments                         Following commands: Intact      Cueing Cueing Techniques: Verbal cues  Exercises      General Comments General comments (skin integrity, edema, etc.): Bleeding noted posterior arm just above R elbow (nurse notified and came to address); bandages B UE's (d/t skin tears)  in place      Pertinent Vitals/Pain Pain Assessment Pain Assessment: No/denies pain Post ambulation HR 84 bpm and SpO2 sats 98% on room air.    Home Living                          Prior Function            PT Goals (current goals can now be found in the care plan section) Acute Rehab PT Goals Patient Stated Goal: improve strength and functional  mobility PT Goal Formulation: With patient Time For Goal Achievement: 11/17/23 Potential to Achieve Goals: Good Progress towards PT goals: Progressing toward goals    Frequency    Min 2X/week      PT Plan      Co-evaluation              AM-PAC PT "6 Clicks" Mobility   Outcome Measure  Help needed turning from your back to your side while in a flat bed without using bedrails?: None Help needed moving from lying on your back to sitting on the side of a flat bed without using bedrails?: A Little Help needed moving to and from a bed to a chair (including a wheelchair)?: A Little Help needed standing up from a chair using your arms (e.g., wheelchair or bedside chair)?: A Little Help needed to walk in hospital room?: A Little Help needed climbing 3-5 steps with a railing? : A Lot 6 Click Score: 18    End of Session Equipment Utilized During Treatment: Gait belt (gait belt up high away from suprapubic catheter) Activity Tolerance: Patient tolerated treatment well Patient left: in chair;with call bell/phone within reach;with chair alarm set;with nursing/sitter in room Nurse Communication: Mobility status;Precautions;Other (comment) (bleeding noted posterior R arm just above elbow) PT Visit Diagnosis: Muscle weakness (generalized) (M62.81);History of falling (Z91.81);Other abnormalities of gait and mobility (R26.89)     Time: 7846-9629 PT Time Calculation (min) (ACUTE ONLY): 19 min  Charges:    $Therapeutic Activity: 8-22 mins PT General Charges $$ ACUTE PT VISIT: 1 Visit                     Amador Junes, PT 11/04/23, 5:39 PM

## 2023-11-04 NOTE — Progress Notes (Signed)
 PROGRESS NOTE    Zachary [name]  WUJ:[id number] DOB: [birth year] DOA: 10/30/2023 PCP: [name], MD  Chief Complaint  Patient presents with   Weakness    Hospital Course:  This is an 83 year old male with hypotension on midodrine , hyperlipidemia, CAD, systolic CHF, CKD stage IV, anemia of chronic disease, history of pulmonary embolism on Eliquis , ESBL UTI, status post suprapubic catheter placement, and recent MRSA bacteremia secondary to cavitary pneumonia currently on linezolid .  He presents with weakness and shortness of breath from his skilled nursing facility.  Patient was just hospitalized 4/29 through 5/13 with MRSA bacteremia secondary to cavitary pneumonia.  He was discharged on linezolid  with plans to continue medication from 5/13 through 5/30.  He is following outpatient with infectious disease. Patient has been short of breath, with occasional cough but no chest pain.  He denies any dark stools or rectal bleeding.  He had labs at the skilled nursing facility which revealed a hemoglobin of 6.8.  In the ED hemoglobin found to be 6.4.  He was hemodynamically stable and other labs unremarkable.  Gastroenterology was consulted.  Subjective: No acute events overnight.  Patient is feeling better today.  Reports he is anxious to work with physical therapy again.  Objective: Vitals:   11/03/23 0905 11/03/23 1650 11/03/23 2019 11/04/23 0351  BP: 114/84 96/63 114/63 100/71  Pulse: 79 74 73 68  Resp: 18 16 20 20   Temp: 97.7 F (36.5 C) 97.7 F (36.5 C) 98 F (36.7 C) 98.2 F (36.8 C)  TempSrc:  Oral  Oral  SpO2: 99% 98% 100% 100%  Weight:    71.6 kg  Height:        Intake/Output Summary (Last 24 hours) at 11/04/2023 0758 Last data filed at 11/04/2023 0350 Gross per 24 hour  Intake 120 ml  Output 650 ml  Net -530 ml   Filed Weights   11/02/23 0344 11/03/23 0003 11/04/23 0351  Weight: 73 kg 71.4 kg 71.6 kg    Examination: General exam: Appears calm and  comfortable, NAD, frail, weak Respiratory system: No work of breathing, symmetric chest wall expansion Cardiovascular system: S1 & S2 heard, RRR.  Gastrointestinal system: Abdomen is nondistended, soft and nontender.  Neuro: Drowsy but arousable. No focal neurological deficits. Extremities: Symmetric, expected ROM Skin: Extremities covered in bruises in multiple stages of healing bruising appears to be improving some. Psychiatry: Demonstrates appropriate judgement and insight. Mood & affect appropriate for situation.   Assessment & Plan:  Principal Problem:   Symptomatic anemia Active Problems:   Iron  deficiency anemia   Chronic systolic CHF (congestive heart failure) (HCC)   Thrombocytopenia (HCC)   Cavitary pneumonia   Pulmonary embolism (HCC)   CAD (coronary artery disease)   HLD (hyperlipidemia)   CKD (chronic kidney disease) stage 4, GFR 15-29 ml/min (HCC)   Protein-calorie malnutrition, severe   GI bleed   Pressure injury of skin   Acute on chronic anemia    Acute on chronic anemia History of iron  deficiency anemia  Anemia of chronic disease - Baseline hemoglobin appears to be 8.0, down to 6.4 this admission. - Status post 2 units PRBCs - Continue to trend hemoglobin, currently downtrending.  No acute bleeding appreciated.  Patient is volume overloaded so there may be some component of hemodilution though not likely enough to explain downtrend. - Was on Eliquis  at home.  Have since held - EGD and colonoscopy 6/1: Upper endoscopy revealed hiatal hernia and gastropathy, no active bleeding.  Schatzki's ring  appreciated.  Colonoscopy reveals stool burden, no fresh blood or old blood.  Colon lumen dilated consistent with chronic constipation - PPI twice daily x 8 weeks - Do not resume anticoagulation - Continue to trend CBC closely - Anemia panel: elevated B12, elevated ferritin, elevated iron  levels and TSAT.  Appears to all be secondary to anemia of chronic disease.  No real  evidence of iron  deficiency.    Chronic systolic congestive heart failure - Echo 10/12/2023: EF 35 to 40% - With lower extremity edema now - Start with daily Lasix , titrate as needed - Strict I's and O's - Diuresing well currently - Blood pressure is soft, diuretics as BP can tolerate - Remainsstable on room air - Monitor volume status closely  Thrombocytopenia - Complicated by symptomatic acute anemia as above - Has been on prolonged course of linezolid  which was discontinued 5/31 - Has bruising covering most of his body, labs not consistent with DIC - LDH within normal limits - Platelet morphology within normal limits on smear - Continue trending daily.  They continue to downtrend though at a slower rate.  May require prolonged washout period of linezolid  - Hematology consulted, appreciate recommendations - Fibrinogen within normal limits - TSH very elevated which may be contributing to marrow suppression and delayed recovery -Continue to hold all anticoagulation  Severe Hypothyroidism - On further chart review it appears patient has history of hypothyroid though he denies this.  He does not take levothyroxine - TSH 45, T4: 0.35. - Will initiate levothyroxine 50 mcg - Will need very close outpatient follow-up for continued titration  Recent cavitary pneumonia Recent MRSA bacteremia - Has been on extended course of Zyvox .  Course completed 5/30. - Currently afebrile without leukocytosis.  No acute concern for infection.  Pulmonary embolism - Meant to be on Eliquis , holding for now given anemia and thrombocytopenia. - Continue holding for now given downtrending hemoglobin and downtrending platelets  CAD - Hold aspirin  - Continue Lipitor  Hyperlipidemia - Continue statin  CKD stage IV - Baseline creatinine 2.1 -Currently improved beyond baseline - Renally dose with a creatinine clearance of 34 - Avoid nephrotoxic medications  Protein calorie malnutrition, severe BMI  16 - Nutrition consult  Hyponatremia - Chronic.  Appears close to baseline currently - AM BMP  Poor prognosis - Given patient's advanced age, severe protein calorie malnutrition, chronic weight loss, thrombocytopenia, pulmonary embolism on Eliquis , recent bacteremia, and now inability to tolerate anticoagulation he has an overall poor prognosis.  I discussed his care directly with his wife, Zachary [name].  At this time shed like us  to continue to do everything we can.  I did offer her palliative care and hospice in the event that he changes his mind and would like to change his focus of treatment.  She endorses understanding.  Will continue to touch base throughout this admission.  Palliative care also consulted.  Appreciate assistance with GOC conversations.  DVT prophylaxis: SCDs, avoid chemical anticoagulation given acute anemia   Code Status: Limited: Do not attempt resuscitation (DNR) -DNR-LIMITED -Do Not Intubate/DNI  Disposition: Admit to inpatient.  Will eventually require discharge to SNF.  Platelets and hemoglobin downtrending still.  Not yet medically cleared Consultants:  Treatment Team:  Consulting Physician: Avonne Boettcher, MD  Procedures:    Antimicrobials:  Anti-infectives (From admission, onward)    Start     Dose/Rate Route Frequency Ordered Stop   10/30/23 2315  linezolid  (ZYVOX ) tablet 600 mg        600 mg  Oral Every 12 hours 10/30/23 2217 10/31/23 1036       Data Reviewed: I have personally reviewed following labs and imaging studies CBC: Recent Labs  Lab 10/30/23 1539 10/30/23 2236 10/31/23 0130 10/31/23 0649 10/31/23 0838 11/02/23 0654 11/03/23 0516 11/04/23 0452  WBC 7.1   < > 5.3 4.5  --  5.9 4.1 3.6*  NEUTROABS 6.1  --   --   --   --  5.0 3.3 2.6  HGB 6.4*   < > 7.2* 8.4* 8.4* 9.0* 8.1* 7.2*  HCT 20.1*   < > 20.7* 24.2* 24.8* 26.2* 24.4* 21.5*  MCV 102.6*   < > 94.1 93.1  --  93.2 94.2 95.1  PLT 119*   < > 93* 83*  --  78* 68* 61*   < > = values in  this interval not displayed.   Basic Metabolic Panel: Recent Labs  Lab 10/31/23 0649 11/01/23 0641 11/02/23 0654 11/03/23 0516 11/04/23 0452  NA 132* 130* 129* 133* 134*  K 4.6 4.6 4.3 4.9 4.6  CL 102 99 100 104 103  CO2 20* 21* 26 24 25   GLUCOSE 81 79 79 81 80  BUN 46* 39* 35* 36* 32*  CREATININE 2.19* 1.86* 1.79* 1.84* 1.75*  CALCIUM  8.4* 8.1* 8.1* 8.2* 8.0*  MG  --  2.0 1.9 2.0 1.8  PHOS  --  2.5 2.4* 2.4* 1.8*   GFR: Estimated Creatinine Clearance: 34.1 mL/min (A) (by C-G formula based on SCr of 1.75 mg/dL (H)). Liver Function Tests: Recent Labs  Lab 10/30/23 1539 11/02/23 0654 11/03/23 0516 11/04/23 0452  AST 33 29 23 21   ALT 34 31 28 22   ALKPHOS 89 81 78 73  BILITOT 0.5 0.9 0.9 0.6  PROT 5.3* 5.1* 4.7* 4.5*  ALBUMIN 2.4* 2.3* 2.2* 2.1*   CBG: No results for input(s): "GLUCAP" in the last 168 hours.  No results found for this or any previous visit (from the past 240 hours).   Radiology Studies: No results found.  Scheduled Meds:  vitamin C   500 mg Oral BID   atorvastatin   10 mg Oral Daily   feeding supplement  237 mL Oral TID BM   iron  polysaccharides  150 mg Oral Daily   levothyroxine  25 mcg Oral Q0600   midodrine   10 mg Oral TID WC   multivitamin with minerals  1 tablet Oral Daily   pantoprazole  (PROTONIX ) IV  40 mg Intravenous Q12H   Continuous Infusions:     LOS: 0 days  MDM: Patient is high risk for one or more organ failure.  They necessitate ongoing hospitalization for continued IV therapies and subsequent lab monitoring. Total time spent interpreting labs and vitals, reviewing the medical record, coordinating care amongst consultants and care team members, directly assessing and discussing care with the patient and/or family: 55 min  Roshan Roback, DO [facility]  To contact the attending physician between 7A-7P please use Epic Chat. To contact the covering physician during after hours 7P-7A, please review Amion.  11/04/2023, 7:58  AM   *This document has been created with the assistance of dictation software. Please excuse typographical errors. *

## 2023-11-04 NOTE — TOC Progression Note (Signed)
 Transition of Care [facility]) - Progression Note    Patient Details  Name: Zachary [name] MRN: [id number] Date of Birth: 1940-10-11  Transition of [facility]) CM/SW Contact  Loman Risk, RN Phone Number: 11/04/2023, 2:29 PM  Clinical Narrative:    Per MD patient not medically ready for discharge.  Will require auth for SNF.  Fl2 sent for signature    Expected Discharge Plan: Skilled Nursing Facility Barriers to Discharge: Continued Medical Work up  Expected Discharge Plan and Services     Post Acute Care Choice: Resumption of Svcs/PTA Provider Living arrangements for the past 2 months: Single Family Home, Skilled Nursing Facility                                       Social Determinants of Health (SDOH) Interventions SDOH Screenings   Food Insecurity: No Food Insecurity (10/31/2023)  Housing: Low Risk  (10/31/2023)  Transportation Needs: No Transportation Needs (10/31/2023)  Utilities: Not At Risk (10/31/2023)  Alcohol  Screen: Low Risk  (09/25/2021)  Depression (PHQ2-9): Low Risk  (09/26/2023)  Financial Resource Strain: Low Risk  (09/25/2021)  Physical Activity: Insufficiently Active (09/25/2021)  Social Connections: Moderately Isolated (10/31/2023)  Stress: No Stress Concern Present (11/24/2019)  Tobacco Use: Low Risk  (11/02/2023)    Readmission Risk Interventions    10/01/2023    2:47 PM  Readmission Risk Prevention Plan  PCP or Specialist Appt within 3-5 Days Complete  Social Work Consult for Recovery Care Planning/Counseling Complete  Palliative Care Screening Not Applicable

## 2023-11-04 NOTE — Progress Notes (Signed)
 Mobility Specialist - Progress Note   11/04/23 1200  Mobility  Activity Ambulated with assistance in hallway;Transferred from bed to chair  Level of Assistance Contact guard assist, steadying assist  Assistive Device Front wheel walker  Distance Ambulated (ft) 160 ft  Activity Response Tolerated well  Mobility visit 1 Mobility     Pt lying in bed upon arrival, utilizing RA. Pt pleasant and motivated for activity. Completed bed mobility with supervision. STS with minA from standard bed height. Pt ambulated 1 lap around NS with CGA fading to minG. No LOB. Pt denied SOB and dizziness. No complaints at this time. Pt returned to chair with alarm set, needs in reach.    Searcy Czech Mobility Specialist 11/04/23, 12:16 PM

## 2023-11-04 NOTE — Progress Notes (Signed)
 Occupational Therapy Treatment Patient Details Name: Zachary [name] MRN: [id number] DOB: Jul 27, 1940 Today's Date: 11/04/2023   History of present illness Pt is an 83 y.o. male presenting to hospital 10/30/23 with c/o weakness and SOB.  Pt admitted with symptomatic anemia and iron  deficiency anemia.  S/p colonoscopy and upper GI endoscopy 11/02/23.  PMH includes htn, HLD, CAD, prior NSTEMI, thoracic aortic aneurysm, hospitalization April 2025 for sepsis secondary to PNA, ESBL, UTI's, h/o PE, hypotension on midodrine , CHF, suprapubic catheter placement, MRSA bacteremia d/t cavitary PNA, vertigo.   OT comments  Upon entering the room, pt seated in recliner chair with no c/o pain and reports feeling better. Pt states, " I have to get back home to my wife". Pt agreeable to OT intervention. He stands from recliner chair with min A and RW. Pt ambulates in room to sink with min guard with RW. Pt brushing teeth but has posterior bias in standing once letting go of RW. 2 posterior LOB during session requiring min - mod A to correct for safety. Pt able to brush teeth while standing with min A for balance. Pt fatigues quickly and returns to sit on EOB at end of session. Sit >supine with supervision. Call bell and all needed items within reach upon exiting the room.       If plan is discharge home, recommend the following:  A little help with walking and/or transfers;A little help with bathing/dressing/bathroom;Assistance with cooking/housework;Assist for transportation;Help with stairs or ramp for entrance   Equipment Recommendations  Other (comment) (defer to next venue of care)       Precautions / Restrictions Precautions Precautions: Fall Recall of Precautions/Restrictions: Intact Precaution/Restrictions Comments: suprapubic cath; very fragile skin       Mobility Bed Mobility Overal bed mobility: Needs Assistance Bed Mobility: Sit to Supine       Sit to supine: Min assist         Transfers Overall transfer level: Needs assistance Equipment used: Rolling walker (2 wheels) Transfers: Sit to/from Stand Sit to Stand: Min assist                 Balance Overall balance assessment: Needs assistance Sitting-balance support: No upper extremity supported, Feet supported Sitting balance-Leahy Scale: Good Sitting balance - Comments: steady reaching within BOS   Standing balance support: Bilateral upper extremity supported, Reliant on assistive device for balance, During functional activity Standing balance-Leahy Scale: Poor                             ADL either performed or assessed with clinical judgement   ADL Overall ADL's : Needs assistance/impaired                                       General ADL Comments: Pt standing at sink with 2 LOB backwards while brushing teeth and placing top back on toothpaste.    Extremity/Trunk Assessment Upper Extremity Assessment Upper Extremity Assessment: Generalized weakness   Lower Extremity Assessment Lower Extremity Assessment: Generalized weakness        Vision Baseline Vision/History: 1 Wears glasses Patient Visual Report: No change from baseline           Communication Communication Communication: Impaired Factors Affecting Communication: Hearing impaired   Cognition Arousal: Alert Behavior During Therapy: WFL for tasks assessed/performed Cognition: No apparent impairments  OT - Cognition Comments: Pt is very pleasant and cooperative.                 Following commands: Intact        Cueing   Cueing Techniques: Verbal cues             Pertinent Vitals/ Pain       Pain Assessment Pain Assessment: No/denies pain         Frequency  Min 2X/week        Progress Toward Goals  OT Goals(current goals can now be found in the care plan section)  Progress towards OT goals: Progressing toward goals      AM-PAC OT "6 Clicks" Daily  Activity     Outcome Measure   Help from another person eating meals?: None Help from another person taking care of personal grooming?: None Help from another person toileting, which includes using toliet, bedpan, or urinal?: A Little Help from another person bathing (including washing, rinsing, drying)?: A Little Help from another person to put on and taking off regular upper body clothing?: None Help from another person to put on and taking off regular lower body clothing?: A Little 6 Click Score: 21    End of Session Equipment Utilized During Treatment: Rolling walker (2 wheels)  OT Visit Diagnosis: Unsteadiness on feet (R26.81);Muscle weakness (generalized) (M62.81)   Activity Tolerance Patient limited by fatigue   Patient Left with call bell/phone within reach;in bed;with bed alarm set   Nurse Communication Mobility status        Time: 1347-1400 OT Time Calculation (min): 13 min  Charges: OT General Charges $OT Visit: 1 Visit OT Treatments $Self Care/Home Management : 8-22 mins  George Kinder, MS, OTR/L , CBIS ascom [phone]  11/04/23, 2:48 PM

## 2023-11-04 NOTE — NC FL2 (Signed)
 [place]  MEDICAID FL2 LEVEL OF CARE FORM     IDENTIFICATION  Patient Name: Zachary [name] Birthdate: [birth year] Sex: male Admission Date (Current Location): 10/30/2023  [facility] and [place] Number:  Chiropodist and Address:         Provider Number: [id number]  Attending Physician Name and Address:  Roise Cleaver, DO  Relative Name and Phone Number:       Current Level of Care: Hospital Recommended Level of Care: Skilled Nursing Facility Prior Approval Number:    Date Approved/Denied:   PASRR Number:    Discharge Plan: SNF    Current Diagnoses: Patient Active Problem List   Diagnosis Date Noted   Acute on chronic anemia 11/03/2023   GI bleed 10/31/2023   Pressure injury of skin 10/31/2023   Symptomatic anemia 10/30/2023   HLD (hyperlipidemia) 10/30/2023   CAD (coronary artery disease) 10/30/2023   Pulmonary embolism (HCC) 10/30/2023   Iron  deficiency anemia 10/30/2023   Chronic systolic CHF (congestive heart failure) (HCC) 10/30/2023   Thrombocytopenia (HCC) 10/30/2023   Cavitary pneumonia 10/06/2023   MRSA bacteremia 10/03/2023   Protein-calorie malnutrition, severe 10/01/2023   Sepsis (HCC) 09/30/2023   Pneumonia 09/30/2023   UTI (urinary tract infection) 09/30/2023   Acute encephalopathy 09/30/2023   CKD (chronic kidney disease) stage 4, GFR 15-29 ml/min (HCC) 09/30/2023   NSTEMI (non-ST elevated myocardial infarction) (HCC) 09/30/2023   Normocytic anemia 09/26/2023   Weight loss 09/26/2023   Leukocytosis 09/26/2023   Acute kidney injury superimposed on chronic kidney disease (HCC) 08/05/2023   Peritonitis (HCC) 08/05/2023   Fluid overload 08/05/2023   Aneurysm of thoracic aorta (HCC) 03/13/2022   Lung nodule 03/13/2022   History of colonic polyps    B12 deficiency anemia 08/03/2019   Elevated MCV 08/03/2019   Cyst, baker's knee, right 07/27/2019   Peripheral edema- right lower leg  07/27/2019   Coronary artery disease involving  native coronary artery of native heart without angina pectoris 04/12/2019   Valvular heart disease 04/12/2019   Pneumothorax on right 05/15/2015   Benign essential tremor 02/28/2015   Mixed hyperlipidemia 02/28/2015   Essential hypertension 02/28/2015   Hypercholesterolemia without hypertriglyceridemia 02/28/2015   Vertigo 02/28/2015    Orientation RESPIRATION BLADDER Height & Weight     Self, Time, Situation, Place  Normal  (suprapubic) Weight: 71.6 kg Height:  6\' 2"  (188 cm)  BEHAVIORAL SYMPTOMS/MOOD NEUROLOGICAL BOWEL NUTRITION STATUS      Incontinent Diet (regular)  AMBULATORY STATUS COMMUNICATION OF NEEDS Skin   Limited Assist Verbally PU Stage and Appropriate Care, Other (Comment) (Skin tear)                       Personal Care Assistance Level of Assistance              Functional Limitations Info    Sight Info: Impaired Hearing Info: Impaired Speech Info: Adequate    SPECIAL CARE FACTORS FREQUENCY  PT (By licensed PT), OT (By licensed OT)                    Contractures Contractures Info: Not present    Additional Factors Info  Code Status, Allergies Code Status Info: DNR Allergies Info: NKDA     Isolation Precautions Info: contact, esbl MRSA     Current Medications (11/04/2023):  This is the current hospital active medication list Current Facility-Administered Medications  Medication Dose Route Frequency Provider Last Rate Last Admin   acetaminophen  (TYLENOL ) tablet  650 mg  650 mg Oral Q6H PRN Niu, Xilin, MD   650 mg at 11/03/23 1000   albuterol  (PROVENTIL ) (2.5 MG/3ML) 0.083% nebulizer solution 3 mL  3 mL Nebulization Q4H PRN Niu, Xilin, MD       ascorbic acid  (VITAMIN C ) tablet 500 mg  500 mg Oral BID Dezii, Alexandra, DO   500 mg at 11/04/23 0946   atorvastatin  (LIPITOR) tablet 10 mg  10 mg Oral Daily Niu, Xilin, MD   10 mg at 11/04/23 4098   dextromethorphan -guaiFENesin  (MUCINEX  DM) 30-600 MG per 12 hr tablet 1 tablet  1 tablet Oral BID  PRN Niu, Xilin, MD       feeding supplement (ENSURE PLUS HIGH PROTEIN) liquid 237 mL  237 mL Oral TID BM Dezii, Alexandra, DO   237 mL at 11/04/23 1316   hydrALAZINE  (APRESOLINE ) injection 5 mg  5 mg Intravenous Q2H PRN Niu, Xilin, MD       iron  polysaccharides (NIFEREX) capsule 150 mg  150 mg Oral Daily Niu, Xilin, MD   150 mg at 11/04/23 0946   levothyroxine (SYNTHROID) tablet 50 mcg  50 mcg Oral Q0600 Dezii, Alexandra, DO   50 mcg at 11/04/23 1191   midodrine  (PROAMATINE ) tablet 10 mg  10 mg Oral TID WC Niu, Xilin, MD   10 mg at 11/04/23 1316   multivitamin with minerals tablet 1 tablet  1 tablet Oral Daily Niu, Xilin, MD   1 tablet at 11/04/23 0946   ondansetron  (ZOFRAN ) injection 4 mg  4 mg Intravenous Q8H PRN Niu, Xilin, MD   4 mg at 11/03/23 2117   pantoprazole  (PROTONIX ) injection 40 mg  40 mg Intravenous Q12H Niu, Xilin, MD   40 mg at 11/04/23 0947   polyethylene glycol-electrolytes (NuLYTELY ) solution 2,000 mL  2,000 mL Oral Once PRN Russo, Steven Michael, DO         Discharge Medications: Please see discharge summary for a list of discharge medications.  Relevant Imaging Results:  Relevant Lab Results:   Additional Information SS#: [id number]  Loman Risk, RN

## 2023-11-05 DIAGNOSIS — D509 Iron deficiency anemia, unspecified: Secondary | ICD-10-CM | POA: Diagnosis not present

## 2023-11-05 DIAGNOSIS — Z515 Encounter for palliative care: Secondary | ICD-10-CM

## 2023-11-05 DIAGNOSIS — Z7189 Other specified counseling: Secondary | ICD-10-CM

## 2023-11-05 DIAGNOSIS — K922 Gastrointestinal hemorrhage, unspecified: Secondary | ICD-10-CM | POA: Diagnosis not present

## 2023-11-05 DIAGNOSIS — I2699 Other pulmonary embolism without acute cor pulmonale: Secondary | ICD-10-CM | POA: Diagnosis not present

## 2023-11-05 DIAGNOSIS — D649 Anemia, unspecified: Secondary | ICD-10-CM | POA: Diagnosis not present

## 2023-11-05 DIAGNOSIS — J189 Pneumonia, unspecified organism: Secondary | ICD-10-CM | POA: Diagnosis not present

## 2023-11-05 LAB — CBC WITH DIFFERENTIAL/PLATELET
Abs Immature Granulocytes: 0.04 10*3/uL (ref 0.00–0.07)
Basophils Absolute: 0 10*3/uL (ref 0.0–0.1)
Basophils Relative: 1 %
Eosinophils Absolute: 0.2 10*3/uL (ref 0.0–0.5)
Eosinophils Relative: 6 %
HCT: 21.1 % — ABNORMAL LOW (ref 39.0–52.0)
Hemoglobin: 7.2 g/dL — ABNORMAL LOW (ref 13.0–17.0)
Immature Granulocytes: 1 %
Lymphocytes Relative: 19 %
Lymphs Abs: 0.6 10*3/uL — ABNORMAL LOW (ref 0.7–4.0)
MCH: 31.9 pg (ref 26.0–34.0)
MCHC: 34.1 g/dL (ref 30.0–36.0)
MCV: 93.4 fL (ref 80.0–100.0)
Monocytes Absolute: 0.3 10*3/uL (ref 0.1–1.0)
Monocytes Relative: 8 %
Neutro Abs: 2.2 10*3/uL (ref 1.7–7.7)
Neutrophils Relative %: 65 %
Platelets: 63 10*3/uL — ABNORMAL LOW (ref 150–400)
RBC: 2.26 MIL/uL — ABNORMAL LOW (ref 4.22–5.81)
RDW: 14.6 % (ref 11.5–15.5)
WBC: 3.4 10*3/uL — ABNORMAL LOW (ref 4.0–10.5)
nRBC: 0 % (ref 0.0–0.2)

## 2023-11-05 LAB — COMPREHENSIVE METABOLIC PANEL WITH GFR
ALT: 23 U/L (ref 0–44)
AST: 22 U/L (ref 15–41)
Albumin: 1.9 g/dL — ABNORMAL LOW (ref 3.5–5.0)
Alkaline Phosphatase: 73 U/L (ref 38–126)
Anion gap: 6 (ref 5–15)
BUN: 32 mg/dL — ABNORMAL HIGH (ref 8–23)
CO2: 23 mmol/L (ref 22–32)
Calcium: 7.9 mg/dL — ABNORMAL LOW (ref 8.9–10.3)
Chloride: 104 mmol/L (ref 98–111)
Creatinine, Ser: 1.72 mg/dL — ABNORMAL HIGH (ref 0.61–1.24)
GFR, Estimated: 40 mL/min — ABNORMAL LOW (ref >60)
Glucose, Bld: 79 mg/dL (ref 70–99)
Potassium: 4.3 mmol/L (ref 3.5–5.1)
Sodium: 133 mmol/L — ABNORMAL LOW (ref 135–145)
Total Bilirubin: 0.4 mg/dL (ref 0.0–1.2)
Total Protein: 4.3 g/dL — ABNORMAL LOW (ref 6.5–8.1)

## 2023-11-05 LAB — PHOSPHORUS: Phosphorus: 1.7 mg/dL — ABNORMAL LOW (ref 2.5–4.6)

## 2023-11-05 LAB — MAGNESIUM: Magnesium: 1.8 mg/dL (ref 1.7–2.4)

## 2023-11-05 MED ORDER — DM-GUAIFENESIN ER 30-600 MG PO TB12: 1.0000 | ORAL_TABLET | Freq: Two times a day (BID) | ORAL | Status: DC | PRN

## 2023-11-05 MED ORDER — LEVOTHYROXINE SODIUM 50 MCG PO TABS: 50.0000 ug | ORAL_TABLET | Freq: Every day | ORAL | Status: DC

## 2023-11-05 MED ORDER — PANTOPRAZOLE SODIUM 40 MG PO TBEC: 40.0000 mg | DELAYED_RELEASE_TABLET | Freq: Two times a day (BID) | ORAL | Status: DC

## 2023-11-05 NOTE — Discharge Summary (Signed)
 Physician Discharge Summary   Patient: Zachary [name] MRN: [id number] DOB: Apr 15, 1941  Admit date:     10/30/2023  Discharge date: 11/05/23  Discharge Physician: Brenna Cam   PCP: [name], MD   Recommendations at discharge:    Hospice at [facility]  Discharge Diagnoses: Principal Problem:   Symptomatic anemia Active Problems:   Iron  deficiency anemia   Chronic systolic CHF (congestive heart failure) (HCC)   Thrombocytopenia (HCC)   Cavitary pneumonia   Pulmonary embolism (HCC)   CAD (coronary artery disease)   HLD (hyperlipidemia)   CKD (chronic kidney disease) stage 4, GFR 15-29 ml/min (HCC)   Protein-calorie malnutrition, severe   Goals of care, counseling/discussion   GI bleed   Pressure injury of skin   Acute on chronic anemia  Hospital Course: Assessment and Plan:  This is an 83 year old male with hypotension on midodrine , hyperlipidemia, CAD, systolic CHF, CKD stage IV, anemia of chronic disease, history of pulmonary embolism on Eliquis , ESBL UTI, status post suprapubic catheter placement, and recent MRSA bacteremia secondary to cavitary pneumonia currently on linezolid .  He presents with weakness and shortness of breath from his skilled nursing facility.  Patient was just hospitalized 4/29 through 5/13 with MRSA bacteremia secondary to cavitary pneumonia.  He was discharged on linezolid  with plans to continue medication from 5/13 through 5/30.  He is following outpatient with infectious disease. Patient has been short of breath, with occasional cough but no chest pain.  He denies any dark stools or rectal bleeding.  He had labs at the skilled nursing facility which revealed a hemoglobin of 6.8.  In the ED hemoglobin found to be 6.4.  He was hemodynamically stable and other labs unremarkable.  Gastroenterology was consulted.   Subjective: No acute events overnight.  Patient is feeling better today.  Reports he is anxious to work with physical therapy again.    Objective:       Vitals:    11/03/23 0905 11/03/23 1650 11/03/23 2019 11/04/23 0351  BP: 114/84 96/63 114/63 100/71  Pulse: 79 74 73 68  Resp: 18 16 20 20   Temp: 97.7 F (36.5 C) 97.7 F (36.5 C) 98 F (36.7 C) 98.2 F (36.8 C)  TempSrc:   Oral   Oral  SpO2: 99% 98% 100% 100%  Weight:       71.6 kg  Height:              Intake/Output Summary (Last 24 hours) at 11/04/2023 0758 Last data filed at 11/04/2023 0350    Gross per 24 hour  Intake 120 ml  Output 650 ml  Net -530 ml         Filed Weights    11/02/23 0344 11/03/23 0003 11/04/23 0351  Weight: 73 kg 71.4 kg 71.6 kg      Examination: General exam: Appears calm and comfortable, NAD, frail, weak Respiratory system: No work of breathing, symmetric chest wall expansion Cardiovascular system: S1 & S2 heard, RRR.  Gastrointestinal system: Abdomen is nondistended, soft and nontender.  Neuro: Drowsy but arousable. No focal neurological deficits. Extremities: Symmetric, expected ROM Skin: Extremities covered in bruises in multiple stages of healing bruising appears to be improving some. Psychiatry: Demonstrates appropriate judgement and insight. Mood & affect appropriate for situation.    Assessment & Plan:  Principal Problem:   Symptomatic anemia Active Problems:   Iron  deficiency anemia   Chronic systolic CHF (congestive heart failure) (HCC)   Thrombocytopenia (HCC)   Cavitary pneumonia   Pulmonary  embolism (HCC)   CAD (coronary artery disease)   HLD (hyperlipidemia)   CKD (chronic kidney disease) stage 4, GFR 15-29 ml/min (HCC)   Protein-calorie malnutrition, severe   GI bleed   Pressure injury of skin   Acute on chronic anemia    Acute on chronic anemia History of iron  deficiency anemia  Anemia of chronic disease - Baseline hemoglobin appears to be 8.0, down to 6.4 this admission. Hb 7.2 today - Status post 2 units PRBCs -  No acute bleeding appreciated.  Patient is volume overloaded so there may be some  component of hemodilution  - Was on Eliquis  at home. It has been DCed after d/w patient and family due to high risk for bleeding, anemia and now being comfort care/Hospice. - EGD and colonoscopy 6/1: Upper endoscopy revealed hiatal hernia and gastropathy, no active bleeding.  Schatzki's ring appreciated.  Colonoscopy reveals stool burden, no fresh blood or old blood.  Colon lumen dilated consistent with chronic constipation - PPI twice daily x 8 weeks - Anemia panel: suggestive of anemia of chronic disease.  No real evidence of iron  deficiency.     Chronic systolic congestive heart failure - Echo 10/12/2023: EF 35 to 40% - well compensated now   Thrombocytopenia - Complicated by symptomatic acute anemia as above - Has been on prolonged course of linezolid  which was discontinued 5/31 - Has bruising covering most of his body, labs not consistent with DIC - LDH within normal limits - Platelet morphology within normal limits on smear - May require prolonged washout period of linezolid  - Hematology seen - Fibrinogen within normal limits - TSH very elevated which may be contributing to marrow suppression and delayed recovery - stopping anticoagulation/Eliquis    Severe Hypothyroidism - On further chart review it appears patient has history of hypothyroid though he denies this.  He does not take levothyroxine - TSH 45, T4: 0.35. - started levothyroxine 50 mcg this admission - Will need very close outpatient follow-up for continued titration   Recent cavitary pneumonia Recent MRSA bacteremia - Has been on extended course of Zyvox .  Course completed 5/30. - Currently afebrile without leukocytosis.  No acute concern for infection.   Pulmonary embolism - Meant to be on Eliquis , stop for now due to ongoing anemia and thrombocytopenia. - No further Eliquis  for now given downtrending hemoglobin and downtrending platelets. Also Hospice/Comfort care   CAD Hyperlipidemia CKD stage IV Protein  calorie malnutrition, severe BMI 16 Chronic Hyponatremia - Appears close to baseline currently    Poor prognosis - Given patient's advanced age, severe protein calorie malnutrition, Acute on chronic weight loss (per POA weight 165 -> 114 lbs in last 2 months), thrombocytopenia, pulmonary embolism on Eliquis  (not stopped_, recent bacteremia, and now inability to tolerate anticoagulation he has an overall poor prognosis.  I discussed his care directly with his POA  They would like hospice at [facility].      Consultants: GI, Onco, Palliative care Disposition: Hospice care Diet recommendation:  Discharge Diet Orders (From admission, onward)     Start     Ordered   11/05/23 0000  Diet - low sodium heart healthy        11/05/23 1104           Carb modified diet DISCHARGE MEDICATION: Allergies as of 11/05/2023   No Known Allergies      Medication List     STOP taking these medications    apixaban  5 MG Tabs tablet Commonly known as: ELIQUIS   aspirin  EC 81 MG tablet   atorvastatin  10 MG tablet Commonly known as: LIPITOR   calcium  carbonate 1250 (500 Ca) MG tablet Commonly known as: OS-CAL - dosed in mg of elemental calcium    linezolid  600 MG tablet Commonly known as: ZYVOX        TAKE these medications    acetaminophen  325 MG tablet Commonly known as: TYLENOL  Take 2 tablets (650 mg total) by mouth every 6 (six) hours as needed for mild pain (pain score 1-3), headache or fever (or Fever >/= 101).   dextromethorphan -guaiFENesin  30-600 MG 12hr tablet Commonly known as: MUCINEX  DM Take 1 tablet by mouth 2 (two) times daily as needed for cough.   feeding supplement Liqd Take 237 mLs by mouth 3 (three) times daily between meals.   iron  polysaccharides 150 MG capsule Commonly known as: NIFEREX Take 1 capsule (150 mg total) by mouth daily. Can be any iron  supplement.   levothyroxine 50 MCG tablet Commonly known as: SYNTHROID Take 1 tablet (50 mcg total) by mouth  daily at 6 (six) AM. Start taking on: November 06, 2023   midodrine  10 MG tablet Commonly known as: PROAMATINE  Take 1 tablet (10 mg total) by mouth 3 (three) times daily with meals.   multivitamin with minerals Tabs tablet Take 1 tablet by mouth daily.   pantoprazole  40 MG tablet Commonly known as: Protonix  Take 1 tablet (40 mg total) by mouth 2 (two) times daily.               Discharge Care Instructions  (From admission, onward)           Start     Ordered   11/05/23 0000  Discharge wound care:       Comments: As above   11/05/23 1104            Follow-up Information     [name], MD. Schedule an appointment as soon as possible for a visit in 1 week(s).   Specialty: Family Medicine Why: [facility] Discharge F/UP Contact information: [address] [place] [address] [phone]                Discharge Exam: Cleavon Curls Weights   11/03/23 0003 11/04/23 0351 11/05/23 0503  Weight: 71.4 kg 71.6 kg 70.8 kg   General exam: Appears calm and comfortable, NAD, frail, weak Respiratory system: No work of breathing, symmetric chest wall expansion Cardiovascular system: S1 & S2 heard, RRR.  Gastrointestinal system: Abdomen is nondistended, soft and nontender.  Neuro: Drowsy but arousable. No focal neurological deficits. Extremities: Symmetric, expected ROM Skin: Extremities covered in bruises in multiple stages of healing bruising appears to be improving some. Psychiatry: Demonstrates appropriate judgement and insight. Mood & affect appropriate for situation.   Condition at discharge: fair  The results of significant diagnostics from this hospitalization (including imaging, microbiology, ancillary and laboratory) are listed below for reference.   Imaging Studies: DG Chest Port 1 View Result Date: 10/10/2023 CLINICAL DATA:  Pleural effusion. EXAM: PORTABLE CHEST 1 VIEW COMPARISON:  Oct 08, 2023. FINDINGS: Stable cardiomediastinal  silhouette. Stable cavitating lesion seen in right lung. Small pleural effusions are noted. Stable hiatal hernia. IMPRESSION: Stable large cavitating lesions seen in right lung base. Small pleural effusions. Stable hiatal hernia. Electronically Signed   By: Rosalene Colon M.D.   On: 10/10/2023 13:18   ECHOCARDIOGRAM COMPLETE Result Date: 10/09/2023    ECHOCARDIOGRAM REPORT   Patient Name:   WILBUR OAKLAND Date of  Exam: 10/08/2023 Medical Rec #:  [id number]           Height:       74.0 in Accession #:    [id number]          Weight:       125.0 lb Date of Birth:  1941/05/31           BSA:          1.780 m Patient Age:    82 years            BP:           105/63 mmHg Patient Gender: M                   HR:           100 bpm. Exam Location:  [facility] Procedure: 2D Echo, Cardiac Doppler and Color Doppler (Both Spectral and Color            Flow Doppler were utilized during procedure). Indications:     Pulm embolis  History:         Patient has no prior history of Echocardiogram examinations.                  CAD; Risk Factors:Hypertension.  Sonographer:     Gaston Karvonen, FE, PE Referring Phys:  [id number] CARALYN HUDSON Diagnosing Phys: Lida Reeks Alluri IMPRESSIONS  1. Left ventricular ejection fraction, by estimation, is 35 to 40%. The left ventricle has moderately decreased function. The left ventricle demonstrates global hypokinesis. There is mild left ventricular hypertrophy. Left ventricular diastolic parameters are indeterminate.  2. Right ventricular systolic function is normal. The right ventricular size is normal.  3. The mitral valve is normal in structure. Mild mitral valve regurgitation.  4. Low flow low gradient, mild to moderate aortic stenosis. The aortic valve is tricuspid. Aortic valve regurgitation is mild to moderate.  5. There is mild dilatation of the aortic root, measuring 42 mm. There is mild dilatation of the ascending aorta, measuring 41 mm. FINDINGS  Left Ventricle: Left ventricular ejection  fraction, by estimation, is 35 to 40%. The left ventricle has moderately decreased function. The left ventricle demonstrates global hypokinesis. The left ventricular internal cavity size was normal in size. There is mild left ventricular hypertrophy. Left ventricular diastolic parameters are indeterminate. Right Ventricle: The right ventricular size is normal. No increase in right ventricular wall thickness. Right ventricular systolic function is normal. Left Atrium: Left atrial size was normal in size. Right Atrium: Right atrial size was normal in size. Pericardium: Trivial pericardial effusion is present. The pericardial effusion is anterior to the right ventricle. Mitral Valve: The mitral valve is normal in structure. Mild mitral valve regurgitation. Tricuspid Valve: The tricuspid valve is normal in structure. Tricuspid valve regurgitation is mild. Aortic Valve: Low flow low gradient, mild to moderate aortic stenosis. The aortic valve is tricuspid. Aortic valve regurgitation is mild to moderate. Aortic regurgitation PHT measures 490 msec. Aortic valve mean gradient measures 12.0 mmHg. Aortic valve peak gradient measures 22.1 mmHg. Aortic valve area, by VTI measures 1.20 cm. Pulmonic Valve: The pulmonic valve was not well visualized. Pulmonic valve regurgitation is mild. Aorta: The ascending aorta was not well visualized. There is mild dilatation of the aortic root, measuring 42 mm. There is mild dilatation of the ascending aorta, measuring 41 mm. IAS/Shunts: The atrial septum is grossly normal.  LEFT VENTRICLE PLAX 2D LVIDd:  4.60 cm LVIDs:         4.10 cm LV PW:         1.20 cm LV IVS:        1.20 cm LVOT diam:     2.30 cm LV SV:         53 LV SV Index:   30 LVOT Area:     4.15 cm  RIGHT VENTRICLE RV Basal diam:  3.50 cm RV Mid diam:    3.60 cm RV S prime:     15.30 cm/s TAPSE (M-mode): 1.8 cm LEFT ATRIUM           Index        RIGHT ATRIUM           Index LA Vol (A4C): 42.1 ml 23.65 ml/m  RA Area:      18.70 cm                                    RA Volume:   52.20 ml  29.33 ml/m  AORTIC VALVE AV Area (Vmax):    1.37 cm AV Area (Vmean):   1.36 cm AV Area (VTI):     1.20 cm AV Vmax:           235.00 cm/s AV Vmean:          161.000 cm/s AV VTI:            0.444 m AV Peak Grad:      22.1 mmHg AV Mean Grad:      12.0 mmHg LVOT Vmax:         77.40 cm/s LVOT Vmean:        52.800 cm/s LVOT VTI:          0.128 m LVOT/AV VTI ratio: 0.29 AI PHT:            490 msec  AORTA Ao Root diam: 4.20 cm Ao Asc diam:  4.10 cm TRICUSPID VALVE TR Peak grad:   25.0 mmHg TR Vmax:        250.00 cm/s  SHUNTS Systemic VTI:  0.13 m Systemic Diam: 2.30 cm Joetta Mustache Electronically signed by Joetta Mustache Signature Date/Time: 10/09/2023/9:00:43 AM    Final    DG Chest Port 1 View Result Date: 10/08/2023 CLINICAL DATA:  Status post right thoracentesis. EXAM: PORTABLE CHEST 1 VIEW COMPARISON:  Oct 07, 2023. FINDINGS: No definite pneumothorax is noted status post thoracentesis. No significant residual pleural effusion is noted. As described on prior studies. IMPRESSION: No definite pneumothorax status post thoracentesis. Electronically Signed   By: Rosalene Colon M.D.   On: 10/08/2023 13:18   US  THORACENTESIS ASP PLEURAL SPACE W/IMG GUIDE Result Date: 10/08/2023 INDICATION: 83 year old male with MRSA pneumonia, lung empyema, bilateral pleural effusions. IR was requested for diagnostic and therapeutic right-sided thoracentesis. EXAM: ULTRASOUND GUIDED DIAGNOSTIC AND THERAPEUTIC THORACENTESIS MEDICATIONS: 4 cc of 1% lidocaine  COMPLICATIONS: None immediate. PROCEDURE: An ultrasound guided thoracentesis was thoroughly discussed with the patient and questions answered. The benefits, risks, alternatives and complications were also discussed. The patient understands and wishes to proceed with the procedure. Written consent was obtained. Ultrasound was performed to localize and mark an adequate pocket of fluid in the right chest. The area was  then prepped and draped in the normal sterile fashion. 1% Lidocaine  was used for local anesthesia. Under ultrasound guidance a 6 Fr Safe-T-Centesis catheter was introduced. Thoracentesis was  performed. The catheter was removed and a dressing applied. FINDINGS: A total of approximately 300 cc of clear, straw-colored fluid fluid was removed. Samples were sent to the laboratory as requested by the clinical team. IMPRESSION: Successful ultrasound guided right thoracentesis yielding 300 cc of pleural fluid. Procedure performed by Lambert Pillion, PA-C Electronically Signed   By: Fernando Hoyer M.D.   On: 10/08/2023 12:27   ECHO TEE Result Date: 10/07/2023    TRANSESOPHOGEAL ECHO REPORT   Patient Name:   DARONTE SHOSTAK Date of Exam: 10/07/2023 Medical Rec #:  [id number]           Height:       74.0 in Accession #:    [id number]          Weight:       125.0 lb Date of Birth:  [birth year]           BSA:          1.780 m Patient Age:    82 years            BP:           95/62 mmHg Patient Gender: M                   HR:           51 bpm. Exam Location:  [facility] Procedure: Transesophageal Echo, Cardiac Doppler and Color Doppler (Both            Spectral and Color Flow Doppler were utilized during procedure). Indications:     Not listed on TEE check-in sheet  History:         Patient has no prior history of Echocardiogram examinations.                  Risk Factors:Hypertension.  Sonographer:     Broadus Canes Referring Phys:  [id number] CARALYN HUDSON Diagnosing Phys: Joetta Mustache PROCEDURE: After discussion of the risks and benefits of a TEE, an informed consent was obtained from the patient. The transesophogeal probe was passed without difficulty through the esophogus of the patient. Local oropharyngeal anesthetic was provided with Cetacaine . Sedation performed by different physician. The patient was monitored while under deep sedation. Image quality was good. The patient's vital signs; including heart rate, blood pressure,  and oxygen  saturation; remained stable throughout the  procedure. The patient developed no complications during the procedure.  IMPRESSIONS  1. Left ventricular ejection fraction, by estimation, is 35 to 40%. The left ventricle has moderately decreased function.  2. Right ventricular systolic function is mildly reduced. The right ventricular size is normal.  3. Left atrial size was severely dilated. No left atrial/left atrial appendage thrombus was detected.  4. The mitral valve is normal in structure. Mild mitral valve regurgitation.  5. Low flow low gradient aortic stenosis, moderate. The aortic valve is tricuspid. Aortic valve regurgitation is mild to moderate. Conclusion(s)/Recommendation(s): No evidence of vegetation/infective endocarditis on this transesophageael echocardiogram. FINDINGS  Left Ventricle: Left ventricular ejection fraction, by estimation, is 35 to 40%. The left ventricle has moderately decreased function. The left ventricular internal cavity size was normal in size. Right Ventricle: The right ventricular size is normal. No increase in right ventricular wall thickness. Right ventricular systolic function is mildly reduced. Left Atrium: Left atrial size was severely dilated. No left atrial/left atrial appendage thrombus was detected. Right Atrium: Right atrial size was normal in size. Pericardium: There is no evidence of pericardial effusion. Mitral Valve: The  mitral valve is normal in structure. Mild mitral valve regurgitation. Tricuspid Valve: The tricuspid valve is normal in structure. Tricuspid valve regurgitation is mild. Aortic Valve: Low flow low gradient aortic stenosis, moderate. The aortic valve is tricuspid. Aortic valve regurgitation is mild to moderate. Aortic valve mean gradient measures 11.5 mmHg. Aortic valve peak gradient measures 20.9 mmHg. Aortic valve area,  by VTI measures 1.40 cm. Pulmonic Valve: The pulmonic valve was normal in structure. Pulmonic valve regurgitation is  trivial. Aorta: The aortic root is normal in size and structure. IAS/Shunts: No atrial level shunt detected by color flow Doppler.  LEFT VENTRICLE PLAX 2D LVOT diam:     2.60 cm LV SV:         56 LV SV Index:   32 LVOT Area:     5.31 cm  AORTIC VALVE AV Area (Vmax):    1.46 cm AV Area (Vmean):   1.34 cm AV Area (VTI):     1.40 cm AV Vmax:           228.50 cm/s AV Vmean:          155.000 cm/s AV VTI:            0.402 m AV Peak Grad:      20.9 mmHg AV Mean Grad:      11.5 mmHg LVOT Vmax:         62.70 cm/s LVOT Vmean:        39.200 cm/s LVOT VTI:          0.106 m LVOT/AV VTI ratio: 0.26  SHUNTS Systemic VTI:  0.11 m Systemic Diam: 2.60 cm Joetta Mustache Electronically signed by Joetta Mustache Signature Date/Time: 10/07/2023/2:00:53 PM    Final    DG Chest Port 1 View Result Date: 10/07/2023 CLINICAL DATA:  Weakness and fever.  Pleural effusion. EXAM: PORTABLE CHEST 1 VIEW COMPARISON:  CT chest, abdomen, and pelvis dated 10/06/2023. Chest radiograph dated 09/30/2023. FINDINGS: The heart size and mediastinal contours are unchanged. Aortic atherosclerosis. Large cavitary lesion is again noted in the right lung with surrounding airspace opacities. Similar small bilateral pleural effusions with associated bibasilar atelectasis. Unchanged large hiatal hernia. No pneumothorax. No acute osseous abnormality. IMPRESSION: 1. Large cavitary lesion is again noted in the right lung with surrounding airspace opacities, concerning for cavitary pneumonia, better evaluated on the prior CT chest, abdomen, and pelvis dated 10/06/2023. 2. Similar small bilateral pleural effusions with associated bibasilar atelectasis. Electronically Signed   By: Mannie Seek M.D.   On: 10/07/2023 12:42   CT CHEST ABDOMEN PELVIS W CONTRAST Result Date: 10/06/2023 CLINICAL DATA:  Worsening leukocytosis and transaminitis despite antibiotic treatment. Unintentional weight loss. Known cavitary pneumonia. Clinical concern for possible abscess or  malignancy. EXAM: CT CHEST, ABDOMEN, AND PELVIS WITH CONTRAST TECHNIQUE: Multidetector CT imaging of the chest, abdomen and pelvis was performed following the standard protocol during bolus administration of intravenous contrast. RADIATION DOSE REDUCTION: This exam was performed according to the departmental dose-optimization program which includes automated exposure control, adjustment of the mA and/or kV according to patient size and/or use of iterative reconstruction technique. CONTRAST:  80mL OMNIPAQUE  IOHEXOL  300 MG/ML  SOLN COMPARISON:  Chest CT dated 10/01/2023. Abdomen and pelvis CT dated 08/08/2023. FINDINGS: CT CHEST FINDINGS Cardiovascular: There is a moderate-sized pulmonary embolus in the distal left main pulmonary artery and extending into the proximal left upper lobe pulmonary artery without occlusion. A smaller embolus is demonstrated in a right lower lobe pulmonary artery branch. These are not  sufficient to cause right heart strain. The previously demonstrated 4.5 cm ascending thoracic aorta aneurysm currently measures 4.3 cm. Enlarged central pulmonary arteries with a main pulmonary artery measuring 3.6 cm in diameter. Enlarged heart. Atheromatous calcifications, including the coronary arteries and aorta. Slight increase in size of a small pericardial effusion with a maximum thickness of 8 mm. Mediastinum/Nodes: No enlarged mediastinal, hilar, or axillary lymph nodes. Thyroid  gland, trachea, and esophagus demonstrate no significant findings. Again demonstrated is a large hiatal hernia with an intrathoracic stomach. Lungs/Pleura: Increased size and confluence of the large cavitary lesion previously demonstrated in the right upper lobe with a moderately thick, irregular peripheral rind and adjacent airspace opacity. This continues to contain some fluid, with mild improvement. This area currently measures 13.2 x 8.2 cm on image number 94/4, previously 8.6 x 6.5 cm in corresponding dimensions.  Continued patchy airspace opacity in the adjacent portions of the right upper lobe. These areas have increased some in size and decreased some in density. The previously demonstrated right middle lobe nodular density has cavitated with 2 adjacent areas of cavitation and patchy opacity currently demonstrated in that area. Small to moderate-sized bilateral pleural effusions, increased on the right and without significant change on the left. Mild bilateral lower lobe atelectasis. Musculoskeletal: Mild thoracic spine degenerative changes. No evidence of bony metastatic disease. CT ABDOMEN PELVIS FINDINGS Hepatobiliary: No focal liver abnormality is seen. No gallstones, gallbladder wall thickening, or biliary dilatation. Pancreas: Unremarkable. No pancreatic ductal dilatation or surrounding inflammatory changes. Spleen: Normal in size without focal abnormality. Adrenals/Urinary Tract: Moderate dilatation of the left renal collecting system and ureter with mild progression mild dilatation of the right renal collecting system and ureter with mild improvement. Again demonstrated is asymmetrical anterior bladder wall thickening, thicker on the right, with improvement. A suprapubic bladder catheter is currently demonstrated. Stable fat density left adrenal myelolipoma and probable lipid poor left adrenal adenoma. Unremarkable right adrenal gland. Stomach/Bowel: Extensive sigmoid and descending colon diverticulosis without evidence of diverticulitis. The appendix is not visualized. Again noted is the entire stomach within a large hiatal hernia. Unremarkable small bowel. Vascular/Lymphatic: Atheromatous arterial calcifications without aneurysm. No enlarged lymph nodes. Reproductive: Mildly enlarged and heterogeneous prostate gland with coarse calcifications. Other: No abdominal wall hernia or abnormality. No abdominopelvic ascites. Musculoskeletal: Moderate lumbar spine degenerative changes and mild scoliosis. IMPRESSION: 1.  Bilateral pulmonary emboli, as described above. These are not sufficient to cause right heart strain. 2. Increased size and confluence of the large cavitary lesion previously demonstrated in the right upper lobe with a moderately thick, irregular peripheral rind and adjacent airspace opacity. This continues to contain some fluid, with mild improvement. This is most likely due to a cavitary pneumonia. A cavitary neoplasm is less likely since it was not present on the abdomen and pelvis CT obtained on 08/08/2023. 3. Continued patchy airspace opacity in the adjacent portions of the right upper lobe. These areas have increased some in size and decreased some in density, compatible with ongoing pneumonia. 4. The previously demonstrated right middle lobe nodular density has cavitated with 2 adjacent areas of cavitation and patchy opacity currently demonstrated in that area, also compatible with cavitary pneumonia. 5. Small to moderate-sized bilateral pleural effusions, increased on the right and without significant change on the left. 6. Mild bilateral lower lobe atelectasis. 7. Slight increase in size of a small pericardial effusion. 8. 4.3 cm ascending thoracic aorta aneurysm. Recommend annual imaging followup by CTA or MRA. This recommendation follows 2010 ACCF/AHA/AATS/ACR/ASA/SCA/SCAI/SIR/STS/SVM Guidelines for  the Diagnosis and Management of Patients with Thoracic Aortic Disease. Circulation. 2010; 121: O962-X528. Aortic aneurysm NOS (ICD10-I71.9) 9. Enlarged central pulmonary arteries, suggesting pulmonary arterial hypertension. 10. Cardiomegaly. 11. Large hiatal hernia with an intrathoracic stomach. 12. Moderate dilatation of the left renal collecting system and ureter with mild progression. Mild dilatation of the right renal collecting system and ureter with mild improvement. This remains compatible with bilateral ureteral obstruction at the level of the ureterovesical junction 13. Asymmetrical anterior bladder  wall thickening, thicker on the right, with improvement. A suprapubic bladder catheter is currently demonstrated. 14. Stable left adrenal myelolipoma and probable lipid poor adenoma. 15. Extensive sigmoid and descending colon diverticulosis without evidence of diverticulitis. 16. Mildly enlarged and heterogeneous prostate gland. 17.  Calcific coronary artery and aortic atherosclerosis. Aortic Atherosclerosis (ICD10-I70.0). Critical Value/emergent results were called by telephone at the time of interpretation on 10/06/2023 at 2:14 pm to provider Melodi Sprung , who verbally acknowledged these results. Electronically Signed   By: Catherin Closs M.D.   On: 10/06/2023 14:32    Microbiology: Results for orders placed or performed during the hospital encounter of 09/30/23  Resp panel by RT-PCR (RSV, Flu A&B, Covid) Anterior Nasal Swab     Status: None   Collection Time: 09/30/23  5:06 AM   Specimen: Anterior Nasal Swab  Result Value Ref Range Status   SARS Coronavirus 2 by RT PCR NEGATIVE NEGATIVE Final    Comment: (NOTE) SARS-CoV-2 target nucleic acids are NOT DETECTED.  The SARS-CoV-2 RNA is generally detectable in upper respiratory specimens during the acute phase of infection. The lowest concentration of SARS-CoV-2 viral copies this assay can detect is 138 copies/mL. A negative result does not preclude SARS-Cov-2 infection and should not be used as the sole basis for treatment or other patient management decisions. A negative result may occur with  improper specimen collection/handling, submission of specimen other than nasopharyngeal swab, presence of viral mutation(s) within the areas targeted by this assay, and inadequate number of viral copies(<138 copies/mL). A negative result must be combined with clinical observations, patient history, and epidemiological information. The expected result is Negative.  Fact Sheet for Patients:  BloggerCourse.com  Fact Sheet  for Healthcare Providers:  SeriousBroker.it  This test is no t yet approved or cleared by the [place]  FDA and  has been authorized for detection and/or diagnosis of SARS-CoV-2 by FDA under an Emergency Use Authorization (EUA). This EUA will remain  in effect (meaning this test can be used) for the duration of the COVID-19 declaration under Section 564(b)(1) of the Act, 21 U.S.C.section 360bbb-3(b)(1), unless the authorization is terminated  or revoked sooner.       Influenza A by PCR NEGATIVE NEGATIVE Final   Influenza B by PCR NEGATIVE NEGATIVE Final    Comment: (NOTE) The Xpert Xpress SARS-CoV-2/FLU/RSV plus assay is intended as an aid in the diagnosis of influenza from Nasopharyngeal swab specimens and should not be used as a sole basis for treatment. Nasal washings and aspirates are unacceptable for Xpert Xpress SARS-CoV-2/FLU/RSV testing.  Fact Sheet for Patients: BloggerCourse.com  Fact Sheet for Healthcare Providers: SeriousBroker.it  This test is not yet approved or cleared by the [place]  FDA and has been authorized for detection and/or diagnosis of SARS-CoV-2 by FDA under an Emergency Use Authorization (EUA). This EUA will remain in effect (meaning this test can be used) for the duration of the COVID-19 declaration under Section 564(b)(1) of the Act, 21 U.S.C. section 360bbb-3(b)(1), unless the authorization is terminated  or revoked.     Resp Syncytial Virus by PCR NEGATIVE NEGATIVE Final    Comment: (NOTE) Fact Sheet for Patients: BloggerCourse.com  Fact Sheet for Healthcare Providers: SeriousBroker.it  This test is not yet approved or cleared by the [place]  FDA and has been authorized for detection and/or diagnosis of SARS-CoV-2 by FDA under an Emergency Use Authorization (EUA). This EUA will remain in effect (meaning  this test can be used) for the duration of the COVID-19 declaration under Section 564(b)(1) of the Act, 21 U.S.C. section 360bbb-3(b)(1), unless the authorization is terminated or revoked.  Performed at [facility], [address], [place], [place] [address]   Blood Culture (routine x 2)     Status: Abnormal   Collection Time: 09/30/23  5:06 AM   Specimen: Left Antecubital; Blood  Result Value Ref Range Status   Specimen Description   Final    LEFT ANTECUBITAL Performed at [facility], [address] [place], [place] [address]    Special Requests   Final    BOTTLES DRAWN AEROBIC AND ANAEROBIC Blood Culture results may not be optimal due to an inadequate volume of blood received in culture bottles Performed at [facility], [address] [place], [place] [address]    Culture  Setup Time   Final    GRAM POSITIVE COCCI AEROBIC BOTTLE ONLY CRITICAL RESULT CALLED TO, READ BACK BY AND VERIFIED WITH:  NATHAN BELUE AT 0401 10/02/23 JG Performed at [facility], [address], [place], [place] [address]    Culture METHICILLIN RESISTANT STAPHYLOCOCCUS AUREUS (A)  Final   Report Status 10/04/2023 FINAL  Final   Organism ID, Bacteria METHICILLIN RESISTANT STAPHYLOCOCCUS AUREUS  Final      Susceptibility   Methicillin resistant staphylococcus aureus - MIC*    CIPROFLOXACIN  >=8 RESISTANT Resistant     ERYTHROMYCIN >=8 RESISTANT Resistant     GENTAMICIN <=0.5 SENSITIVE Sensitive     OXACILLIN >=4 RESISTANT Resistant     TETRACYCLINE <=1 SENSITIVE Sensitive     VANCOMYCIN  1 SENSITIVE Sensitive     TRIMETH /SULFA  <=10 SENSITIVE Sensitive     CLINDAMYCIN <=0.25 SENSITIVE Sensitive     RIFAMPIN <=0.5 SENSITIVE Sensitive     Inducible Clindamycin NEGATIVE Sensitive     LINEZOLID  2 SENSITIVE Sensitive     * METHICILLIN RESISTANT STAPHYLOCOCCUS AUREUS  Blood Culture (routine x 2)     Status: None   Collection Time: 09/30/23  5:06 AM   Specimen: BLOOD   Result Value Ref Range Status   Specimen Description BLOOD RIGTH [facility]  Final   Special Requests   Final    BOTTLES DRAWN AEROBIC AND ANAEROBIC Blood Culture adequate volume   Culture   Final    NO GROWTH 5 DAYS Performed at [facility], [address] [place], [place] [address]    Report Status 10/05/2023 FINAL  Final  Blood Culture ID Panel (Reflexed)     Status: Abnormal   Collection Time: 09/30/23  5:06 AM  Result Value Ref Range Status   Enterococcus faecalis NOT DETECTED NOT DETECTED Final   Enterococcus Faecium NOT DETECTED NOT DETECTED Final   Listeria monocytogenes NOT DETECTED NOT DETECTED Final   Staphylococcus species DETECTED (A) NOT DETECTED Final    Comment: CRITICAL RESULT CALLED TO, READ BACK BY AND VERIFIED WITH:  NATHAN BELUE AT 0401 10/02/23 JG    Staphylococcus aureus (BCID) DETECTED (A) NOT DETECTED Final    Comment: Methicillin (oxacillin)-resistant Staphylococcus  aureus (MRSA). MRSA is predictably resistant to beta-lactam antibiotics (except ceftaroline). Preferred therapy is vancomycin  unless clinically contraindicated. Patient requires contact precautions if  hospitalized. CRITICAL RESULT CALLED TO, READ BACK BY AND VERIFIED WITH:  NATHAN BELUE AT 0401 10/02/23 JG    Staphylococcus epidermidis NOT DETECTED NOT DETECTED Final   Staphylococcus lugdunensis NOT DETECTED NOT DETECTED Final   Streptococcus species NOT DETECTED NOT DETECTED Final   Streptococcus agalactiae NOT DETECTED NOT DETECTED Final   Streptococcus pneumoniae NOT DETECTED NOT DETECTED Final   Streptococcus pyogenes NOT DETECTED NOT DETECTED Final   A.calcoaceticus-baumannii NOT DETECTED NOT DETECTED Final   Bacteroides fragilis NOT DETECTED NOT DETECTED Final   Enterobacterales NOT DETECTED NOT DETECTED Final   Enterobacter cloacae complex NOT DETECTED NOT DETECTED Final   Escherichia coli NOT DETECTED NOT DETECTED Final   Klebsiella aerogenes NOT DETECTED NOT DETECTED Final    Klebsiella oxytoca NOT DETECTED NOT DETECTED Final   Klebsiella pneumoniae NOT DETECTED NOT DETECTED Final   Proteus species NOT DETECTED NOT DETECTED Final   Salmonella species NOT DETECTED NOT DETECTED Final   Serratia marcescens NOT DETECTED NOT DETECTED Final   Haemophilus influenzae NOT DETECTED NOT DETECTED Final   Neisseria meningitidis NOT DETECTED NOT DETECTED Final   Pseudomonas aeruginosa NOT DETECTED NOT DETECTED Final   Stenotrophomonas maltophilia NOT DETECTED NOT DETECTED Final   Candida albicans NOT DETECTED NOT DETECTED Final   Candida auris NOT DETECTED NOT DETECTED Final   Candida glabrata NOT DETECTED NOT DETECTED Final   Candida krusei NOT DETECTED NOT DETECTED Final   Candida parapsilosis NOT DETECTED NOT DETECTED Final   Candida tropicalis NOT DETECTED NOT DETECTED Final   Cryptococcus neoformans/gattii NOT DETECTED NOT DETECTED Final   Meth resistant mecA/C and MREJ DETECTED (A) NOT DETECTED Final    Comment: CRITICAL RESULT CALLED TO, READ BACK BY AND VERIFIED WITHNoe Bath AT 0401 10/02/23 JG Performed at [facility], [address], [place], [place] [address]   Urine Culture     Status: Abnormal   Collection Time: 09/30/23  5:57 AM   Specimen: Urine, Suprapubic  Result Value Ref Range Status   Specimen Description   Final    URINE, SUPRAPUBIC Performed at [facility], [address], [place], [place] [address]    Special Requests   Final    NONE Reflexed from [id number] Performed at [facility], [address] [place], [place] [address]    Culture (A)  Final    >=100,000 COLONIES/mL ESCHERICHIA COLI Confirmed Extended Spectrum Beta-Lactamase Producer (ESBL).  In bloodstream infections from ESBL organisms, carbapenems are preferred over piperacillin/tazobactam. They are shown to have a lower risk of mortality. >=100,000 COLONIES/mL ENTEROCOCCUS FAECALIS    Report Status 10/02/2023 FINAL  Final   Organism ID, Bacteria  ESCHERICHIA COLI (A)  Final   Organism ID, Bacteria ENTEROCOCCUS FAECALIS (A)  Final      Susceptibility   Escherichia coli - MIC*    AMPICILLIN  >=32 RESISTANT Resistant     CEFAZOLIN >=64 RESISTANT Resistant     CEFEPIME  8 INTERMEDIATE Intermediate     CEFTRIAXONE  >=64 RESISTANT Resistant     CIPROFLOXACIN  >=4 RESISTANT Resistant     GENTAMICIN <=1 SENSITIVE Sensitive     IMIPENEM 0.5 SENSITIVE Sensitive     NITROFURANTOIN <=16 SENSITIVE Sensitive     TRIMETH /SULFA  >=320 RESISTANT Resistant     AMPICILLIN /SULBACTAM 4 SENSITIVE Sensitive     PIP/TAZO <=4 SENSITIVE Sensitive ug/mL    * >=  100,000 COLONIES/mL ESCHERICHIA COLI   Enterococcus faecalis - MIC*    AMPICILLIN  <=2 SENSITIVE Sensitive     NITROFURANTOIN <=16 SENSITIVE Sensitive     VANCOMYCIN  1 SENSITIVE Sensitive     * >=100,000 COLONIES/mL ENTEROCOCCUS FAECALIS  MRSA Next Gen by PCR, Nasal     Status: Abnormal   Collection Time: 10/01/23 10:05 PM   Specimen: Nasal Mucosa; Nasal Swab  Result Value Ref Range Status   MRSA by PCR Next Gen DETECTED (A) NOT DETECTED Final    Comment: RESULT CALLED TO, READ BACK BY AND VERIFIED WITH:  ERICKA KLARAS AT 1478 10/02/23 JG (NOTE) The GeneXpert MRSA Assay (FDA approved for NASAL specimens only), is one component of a comprehensive MRSA colonization surveillance program. It is not intended to diagnose MRSA infection nor to guide or monitor treatment for MRSA infections. Test performance is not FDA approved in patients less than 60 years old. Performed at [facility], [address] [place], [place] [address]   Culture, blood (Routine X 2) w Reflex to ID Panel     Status: None   Collection Time: 10/03/23 11:02 AM   Specimen: BLOOD  Result Value Ref Range Status   Specimen Description BLOOD RIGHT ANTECUBITAL  Final   Special Requests   Final    BOTTLES DRAWN AEROBIC AND ANAEROBIC Blood Culture adequate volume   Culture   Final    NO GROWTH 5 DAYS Performed at [facility], [address] [place], [place] [address]    Report Status 10/08/2023 FINAL  Final  Culture, blood (Routine X 2) w Reflex to ID Panel     Status: None   Collection Time: 10/03/23 11:10 AM   Specimen: BLOOD  Result Value Ref Range Status   Specimen Description BLOOD BLOOD RIGHT FOREARM  Final   Special Requests   Final    BOTTLES DRAWN AEROBIC AND ANAEROBIC Blood Culture adequate volume   Culture   Final    NO GROWTH 5 DAYS Performed at [facility], [address] [place], [place] [address]    Report Status 10/08/2023 FINAL  Final  Expectorated Sputum Assessment w Gram Stain, Rflx to Resp Cult     Status: None   Collection Time: 10/06/23  8:39 PM   Specimen: Sputum  Result Value Ref Range Status   Specimen Description SPUTUM  Final   Special Requests NONE  Final   Sputum evaluation   Final    THIS SPECIMEN IS ACCEPTABLE FOR SPUTUM CULTURE Performed at [facility], [address], [place], [place] [address]    Report Status 10/06/2023 FINAL  Final  Culture, Respiratory w Gram Stain     Status: None   Collection Time: 10/06/23  8:39 PM   Specimen: SPU  Result Value Ref Range Status   Specimen Description   Final    SPUTUM Performed at [facility], [address] [place], [place] [address]    Special Requests   Final    NONE Reflexed from [id number] Performed at [facility], [address] [place], [place] [address]    Gram Stain   Final    RARE WBC PRESENT, PREDOMINANTLY PMN FEW GRAM POSITIVE COCCI RARE BUDDING YEAST SEEN Performed at [facility], [address], [place], [place] [address]    Culture   Final    ABUNDANT METHICILLIN RESISTANT STAPHYLOCOCCUS AUREUS   Report Status 10/09/2023 FINAL  Final   Organism ID, Bacteria METHICILLIN RESISTANT STAPHYLOCOCCUS  AUREUS  Final      Susceptibility   Methicillin resistant staphylococcus aureus - MIC*    CIPROFLOXACIN  >=8 RESISTANT Resistant     ERYTHROMYCIN  >=8 RESISTANT Resistant     GENTAMICIN <=0.5 SENSITIVE Sensitive     OXACILLIN >=4 RESISTANT Resistant     TETRACYCLINE <=1 SENSITIVE Sensitive     VANCOMYCIN  1 SENSITIVE Sensitive     TRIMETH /SULFA  <=10 SENSITIVE Sensitive     CLINDAMYCIN <=0.25 SENSITIVE Sensitive     RIFAMPIN <=0.5 SENSITIVE Sensitive     Inducible Clindamycin NEGATIVE Sensitive     LINEZOLID  2 SENSITIVE Sensitive     * ABUNDANT METHICILLIN RESISTANT STAPHYLOCOCCUS AUREUS  Acid Fast Smear (AFB)     Status: None   Collection Time: 10/06/23  8:42 PM   Specimen: Sputum  Result Value Ref Range Status   AFB Specimen Processing Concentration  Final   Acid Fast Smear Negative  Final    Comment: (NOTE) Performed At: [facility] [address] [place], [place] [id number] Pearlean Botts MD [id number]    Source (AFB) SPUTUM  Final    Comment: Performed at [facility], [address] [place], [place] [address]  Culture, fungus without smear     Status: Abnormal   Collection Time: 10/06/23  8:43 PM   Specimen: SPU; Other  Result Value Ref Range Status   Specimen Description   Final    SPUTUM Performed at [facility], [address], [place], [place] [address]    Special Requests   Final    NONE Performed at [facility], [address] [place], [place] [address]    Culture CANDIDA PARAPSILOSIS (A)  Final   Report Status 10/20/2023 FINAL  Final  Body fluid culture w Gram Stain     Status: None   Collection Time: 10/08/23 11:58 AM   Specimen: PATH Cytology Pleural fluid  Result Value Ref Range Status   Specimen Description   Final    FLUID Performed at [facility], [address], [place], [place] [address]    Special Requests   Final    CYTO PLEU Performed at [facility], [address] [place], [place] [address]    Gram Stain   Final    FEW WBC PRESENT,BOTH PMN AND MONONUCLEAR NO ORGANISMS SEEN    Culture   Final    NO GROWTH 3  DAYS Performed at [facility], [address], [place], [place] [address]    Report Status 10/12/2023 FINAL  Final  Acid Fast Smear (AFB)     Status: None   Collection Time: 10/09/23  4:00 PM   Specimen: Sputum  Result Value Ref Range Status   AFB Specimen Processing Concentration  Final   Acid Fast Smear Negative  Final    Comment: (NOTE) Performed At: [facility] [address] [place], [place] [id number] Pearlean Botts MD [id number]    Source (AFB) SPUTUM  Final    Comment: Performed at [facility], [address] [place], [place] [address]  Acid Fast Smear (AFB)     Status: None   Collection Time: 10/10/23  5:45 PM   Specimen: Sputum  Result Value Ref Range Status   AFB Specimen Processing Concentration  Final   Acid Fast Smear Negative  Final    Comment: (NOTE) Performed At: [facility] [address] [place], [place] [id number] Pearlean Botts MD VO:[id number]    Source (AFB) SPUTUM  Final  Comment: Performed at [facility], [address] [place], [place] [address]    Labs: CBC: Recent Labs  Lab 10/30/23 1539 10/30/23 2236 10/31/23 6045 10/31/23 4098 11/02/23 0654 11/03/23 0516 11/04/23 0452 11/05/23 0352  WBC 7.1   < > 4.5  --  5.9 4.1 3.6* 3.4*  NEUTROABS 6.1  --   --   --  5.0 3.3 2.6 2.2  HGB 6.4*   < > 8.4* 8.4* 9.0* 8.1* 7.2* 7.2*  HCT 20.1*   < > 24.2* 24.8* 26.2* 24.4* 21.5* 21.1*  MCV 102.6*   < > 93.1  --  93.2 94.2 95.1 93.4  PLT 119*   < > 83*  --  78* 68* 61* 63*   < > = values in this interval not displayed.   Basic Metabolic Panel: Recent Labs  Lab 11/01/23 0641 11/02/23 0654 11/03/23 0516 11/04/23 0452 11/05/23 0352  NA 130* 129* 133* 134* 133*  K 4.6 4.3 4.9 4.6 4.3  CL 99 100 104 103 104  CO2 21* 26 24 25 23   GLUCOSE 79 79 81 80 79  BUN 39* 35* 36* 32* 32*  CREATININE 1.86* 1.79* 1.84* 1.75* 1.72*  CALCIUM  8.1* 8.1* 8.2* 8.0* 7.9*  MG 2.0 1.9 2.0 1.8 1.8  PHOS 2.5 2.4*  2.4* 1.8* 1.7*   Liver Function Tests: Recent Labs  Lab 10/30/23 1539 11/02/23 0654 11/03/23 0516 11/04/23 0452 11/05/23 0352  AST 33 29 23 21 22   ALT 34 31 28 22 23   ALKPHOS 89 81 78 73 73  BILITOT 0.5 0.9 0.9 0.6 0.4  PROT 5.3* 5.1* 4.7* 4.5* 4.3*  ALBUMIN 2.4* 2.3* 2.2* 2.1* 1.9*   CBG: No results for input(s): "GLUCAP" in the last 168 hours.  Discharge time spent: greater than 30 minutes.  Signed: Brenna Cam, MD [facility] 11/05/2023

## 2023-11-05 NOTE — IPAL (Signed)
  Interdisciplinary Goals of Care Family Meeting   Date carried out: 11/05/2023  Location of the meeting: Bedside  Member's involved: Physician and Family Member or next of kin  Durable Power of Attorney or Environmental health practitioner: POA/Zachary [name]    Discussion: We discussed goals of care for Zachary [name]   Code status:   Code Status: Limited: Do not attempt resuscitation (DNR) -DNR-LIMITED -Do Not Intubate/DNI    Disposition: Home with Hospice/[facility] with Hospice  Time spent for the meeting: 35 mins    Brenna Cam, MD  11/05/2023, 9:29 AM

## 2023-11-05 NOTE — Plan of Care (Signed)
                                                     Palliative Care Progress Note   Patient Name: Zachary [name]       Date: 11/05/2023 DOB: 1941/04/30  Age: 83 y.o. MRN#: [id number] Attending Physician: Brenna Cam, MD Primary Care Physician: [name], MD Admit Date: 10/30/2023  Extensive chart review completed including labs, vital signs, imaging, progress notes, orders, and available advanced directive documents from current and previous encounters.   Discharge summary in place.  Plan is for patient to return to [facility] with hospice services to follow.  Hospice liaison and TOC following closely for discharge planning.  No acute inpatient palliative needs at this time.  Thank you for allowing the Palliative Medicine Team to assist in the care of Zachary [name].  Judeen Nose L. Rebbeca Campi, DNP, FNP-BC Palliative Medicine Team    No charge

## 2023-11-05 NOTE — Progress Notes (Signed)
 [facility] Liaison Note  Received referra from Transitions of Care Manager, Stafford Eagles, RN, for hospice services at [facility] after discharge.   Spoke with patient and Zachary [name] to initiate education related to hospice philosophy, services, and team approach to care. Both verbalized understanding of information given.    Facility to provide needed DME.    Please send signed and completed DNR LTC with patient/family.    [facility] information and contact numbers given to family & above information shared with TOC.   Please call with any questions/concerns.    Thank you for the opportunity to participate in this patient's care.  [facility] Liaison [phone]

## 2023-11-05 NOTE — TOC Transition Note (Signed)
 Transition of Care [facility]) - Discharge Note   Patient Details  Name: Zachary [name] MRN: [id number] Date of Birth: 06-22-1940  Transition [facility] CM/SW Contact:  Loman Risk, RN Phone Number: 11/05/2023, 11:33 AM   Clinical Narrative:     Patient to dc today Per MD patient and POA [name] interested in patient returning with hospice services.   Spoke with [name] at [employer], he confirms that patient  can return with hospice services private pay  Patient and [name] in agreement.  [name] states they do not have a preference of agency.  Referral made to [name] with [facility]   Patient will DC to: [facility] Anticipated DC date: 11/05/23  Family notified: Brother in law/POA [name] Transport by: [name]  Per MD patient ready for DC to . RN, patient, patient's family, and facility notified of DC. Discharge Summary sent to facility. RN given number for report. DC packet on chart.  TOC signing off..     Barriers to Discharge: Continued Medical Work up   Patient Goals and CMS Choice     Choice offered to / list presented to : Patient      Discharge Placement                       Discharge Plan and Services Additional resources added to the After Visit Summary for       Post Acute Care Choice: Resumption of Svcs/PTA Provider                               Social Drivers of Health (SDOH) Interventions SDOH Screenings   Food Insecurity: No Food Insecurity (10/31/2023)  Housing: Low Risk  (10/31/2023)  Transportation Needs: No Transportation Needs (10/31/2023)  Utilities: Not At Risk (10/31/2023)  Alcohol  Screen: Low Risk  (09/25/2021)  Depression (PHQ2-9): Low Risk  (09/26/2023)  Financial Resource Strain: Low Risk  (09/25/2021)  Physical Activity: Insufficiently Active (09/25/2021)  Social Connections: Moderately Isolated (10/31/2023)  Stress: No Stress Concern Present (11/24/2019)  Tobacco Use: Low Risk  (11/02/2023)     Readmission Risk  Interventions    10/01/2023    2:47 PM  Readmission Risk Prevention Plan  PCP or Specialist Appt within 3-5 Days Complete  Social Work Consult for Recovery Care Planning/Counseling Complete  Palliative Care Screening Not Applicable

## 2023-11-05 NOTE — Plan of Care (Signed)
   Problem: Education: Goal: Knowledge of General Education information will improve Description: Including pain rating scale, medication(s)/side effects and non-pharmacologic comfort measures Outcome: Progressing   Problem: Health Behavior/Discharge Planning: Goal: Ability to manage health-related needs will improve Outcome: Progressing   Problem: Activity: Goal: Risk for activity intolerance will decrease Outcome: Progressing

## 2023-11-06 DIAGNOSIS — I950 Idiopathic hypotension: Secondary | ICD-10-CM | POA: Diagnosis not present

## 2023-11-06 DIAGNOSIS — N183 Chronic kidney disease, stage 3 unspecified: Secondary | ICD-10-CM | POA: Diagnosis not present

## 2023-11-06 DIAGNOSIS — E43 Unspecified severe protein-calorie malnutrition: Secondary | ICD-10-CM | POA: Diagnosis not present

## 2023-11-06 DIAGNOSIS — E039 Hypothyroidism, unspecified: Secondary | ICD-10-CM | POA: Diagnosis not present

## 2023-11-06 DIAGNOSIS — D631 Anemia in chronic kidney disease: Secondary | ICD-10-CM | POA: Diagnosis not present

## 2023-11-06 DIAGNOSIS — R609 Edema, unspecified: Secondary | ICD-10-CM | POA: Diagnosis not present

## 2023-11-06 DIAGNOSIS — R5381 Other malaise: Secondary | ICD-10-CM | POA: Diagnosis not present

## 2023-11-06 DIAGNOSIS — R918 Other nonspecific abnormal finding of lung field: Secondary | ICD-10-CM | POA: Diagnosis not present

## 2023-11-06 DIAGNOSIS — I5022 Chronic systolic (congestive) heart failure: Secondary | ICD-10-CM | POA: Diagnosis not present

## 2023-11-07 ENCOUNTER — Inpatient Hospital Stay

## 2023-11-07 ENCOUNTER — Encounter: Payer: Self-pay | Admitting: Oncology

## 2023-11-07 ENCOUNTER — Inpatient Hospital Stay: Attending: Oncology | Admitting: Oncology

## 2023-11-07 VITALS — BP 86/58 | HR 58 | Temp 97.6°F | Resp 18 | Wt 156.0 lb

## 2023-11-07 DIAGNOSIS — D631 Anemia in chronic kidney disease: Secondary | ICD-10-CM | POA: Diagnosis not present

## 2023-11-07 DIAGNOSIS — D696 Thrombocytopenia, unspecified: Secondary | ICD-10-CM | POA: Diagnosis not present

## 2023-11-07 DIAGNOSIS — D649 Anemia, unspecified: Secondary | ICD-10-CM | POA: Diagnosis not present

## 2023-11-07 DIAGNOSIS — N189 Chronic kidney disease, unspecified: Secondary | ICD-10-CM | POA: Diagnosis not present

## 2023-11-07 DIAGNOSIS — E039 Hypothyroidism, unspecified: Secondary | ICD-10-CM | POA: Insufficient documentation

## 2023-11-07 DIAGNOSIS — I959 Hypotension, unspecified: Secondary | ICD-10-CM | POA: Insufficient documentation

## 2023-11-07 DIAGNOSIS — I2699 Other pulmonary embolism without acute cor pulmonale: Secondary | ICD-10-CM

## 2023-11-07 DIAGNOSIS — R634 Abnormal weight loss: Secondary | ICD-10-CM | POA: Diagnosis not present

## 2023-11-07 DIAGNOSIS — Z8042 Family history of malignant neoplasm of prostate: Secondary | ICD-10-CM | POA: Diagnosis not present

## 2023-11-07 DIAGNOSIS — Z86711 Personal history of pulmonary embolism: Secondary | ICD-10-CM | POA: Insufficient documentation

## 2023-11-07 LAB — CBC WITH DIFFERENTIAL (CANCER CENTER ONLY)
Abs Immature Granulocytes: 0.84 10*3/uL — ABNORMAL HIGH (ref 0.00–0.07)
Basophils Absolute: 0.1 10*3/uL (ref 0.0–0.1)
Basophils Relative: 2 %
Eosinophils Absolute: 0.1 10*3/uL (ref 0.0–0.5)
Eosinophils Relative: 2 %
HCT: 26.5 % — ABNORMAL LOW (ref 39.0–52.0)
Hemoglobin: 8.8 g/dL — ABNORMAL LOW (ref 13.0–17.0)
Immature Granulocytes: 11 %
Lymphocytes Relative: 13 %
Lymphs Abs: 1 10*3/uL (ref 0.7–4.0)
MCH: 31.8 pg (ref 26.0–34.0)
MCHC: 33.2 g/dL (ref 30.0–36.0)
MCV: 95.7 fL (ref 80.0–100.0)
Monocytes Absolute: 0.9 10*3/uL (ref 0.1–1.0)
Monocytes Relative: 12 %
Neutro Abs: 4.6 10*3/uL (ref 1.7–7.7)
Neutrophils Relative %: 60 %
Platelet Count: 135 10*3/uL — ABNORMAL LOW (ref 150–400)
RBC: 2.77 MIL/uL — ABNORMAL LOW (ref 4.22–5.81)
RDW: 14.4 % (ref 11.5–15.5)
Smear Review: ADEQUATE
WBC Count: 7.6 10*3/uL (ref 4.0–10.5)
nRBC: 0 % (ref 0.0–0.2)

## 2023-11-07 LAB — SAMPLE TO BLOOD BANK

## 2023-11-07 NOTE — Assessment & Plan Note (Signed)
 Likely due to marrow suppression due to hypothyroidism and recent Zyvox  use. Today's repeat CBC showed improved thrombocytopenia.

## 2023-11-07 NOTE — Assessment & Plan Note (Signed)
 anemia secondary to chronic kidney disease, marrow suppression due to recent infection/antibiotic treatments..   Patient has adequate B12, folate level.  Negative M protein now protein electrophoresis. Lab Results  Component Value Date   HGB 8.8 (L) 11/07/2023   TIBC 249 (L) 10/30/2023   IRONPCTSAT 94 (H) 10/30/2023   FERRITIN 475 (H) 10/30/2023    Check CBC today.  Hemoglobin has improved since discharge. Ferritin is above 200.  At goal. Consider erythropoietin replacement therapy in the future.  He has a history of thrombosis and currently off anticoagulation.  Plan to initiate erythropoietin replacement once he resumes anticoagulation.

## 2023-11-07 NOTE — Assessment & Plan Note (Signed)
 Chronic issue for him. Asymptomatic.  He is on Midodrine .

## 2023-11-07 NOTE — Assessment & Plan Note (Signed)
 Started on Synthroid 50mcg during admission.  I recommend patient to follow closely with PCP for titration of Synthroid dosage.

## 2023-11-07 NOTE — Progress Notes (Addendum)
 Hematology/Oncology Progress note [phone] [phone]           REFERRING PROVIDER: [name], MD   CHIEF COMPLAINTS/REASON FOR VISIT:  Evaluation of anemia, lung nodule.    ASSESSMENT & PLAN:   Normocytic anemia anemia secondary to chronic kidney disease, marrow suppression due to recent infection/antibiotic treatments..   Patient has adequate B12, folate level.  Negative M protein now protein electrophoresis. Lab Results  Component Value Date   HGB 8.8 (L) 11/07/2023   TIBC 249 (L) 10/30/2023   IRONPCTSAT 94 (H) 10/30/2023   FERRITIN 475 (H) 10/30/2023    Check CBC today.  Hemoglobin has improved since discharge. Ferritin is above 200.  At goal. Consider erythropoietin replacement therapy in the future.  He has a history of thrombosis and currently off anticoagulation.  Plan to initiate erythropoietin replacement once he resumes anticoagulation.  Weight loss Likely secondary to hypothyroidism, recent chemotherapy pneumonia, poor oral intake due to multiple comorbidities.   Thrombocytopenia (HCC) Likely due to marrow suppression due to hypothyroidism and recent Zyvox  use. Today's repeat CBC showed improved thrombocytopenia.   Pulmonary embolism (HCC) Currently off anticoagulation due to progressive anemia and thrombocytopenia.  Close monitor. His platelet count has improved. Repeat cbc in 2 weeks. If stable, consider resume on low dose Eliquis  2.5mg  BID.    Hypothyroidism Started on Synthroid  50mcg during admission.  I recommend patient to follow closely with PCP for titration of Synthroid  dosage.   Hypotension Chronic issue for him. Asymptomatic.  He is on Midodrine .    Orders Placed This Encounter  Procedures   CBC with Differential (Cancer Center Only)    Standing Status:   Future    Number of Occurrences:   1    Expected Date:   11/07/2023    Expiration Date:   11/06/2024   Sample to Blood Bank    Standing Status:    Future    Number of Occurrences:   1    Expected Date:   11/07/2023    Expiration Date:   11/06/2024   Follow-up in a few weeks to review results. All questions were answered. The patient knows to call the clinic with any problems, questions or concerns.  [name], MD, PhD [facility] Hematology Oncology 11/07/2023   HISTORY OF PRESENTING ILLNESS:   Zachary [name] is a  83 y.o.  male with PMH listed below was seen in consultation at the request of  [name], MD  for anemia, thrombocytopenia, weight loss.   Patient reports feeling tired and fatigued. He has lost a weight since his recent hospitalization in March 2025.  + Early satiety  he was accompanied by his brother-in-law who is also his POA Patient was admitted 08/05/2023 - 08/08/2022 secondary to urinary retention with severe bladder distention, hydronephrosis, evidence of left calyceal rupture, left hematuria.  Patient has suprapubic catheter placed.  Patient distally manages her catheter well and drains 4 times a day.  He has an appointment with urology next month.  PSA was normal.  Mild prostamegaly  08/08/2023 CT abdomen pelvis without contrast showed new nodule 9 x 8 mm right middle lobe new from 2023.  Patient developed AKI secondary to bladder outlet obstruction. He has a history of chronic kidney disease.  Most recent creatinine was 1.64, close to his baseline   INTERVAL HISTORY Zachary [name] is a 83 y.o. male who has above history reviewed by me today presents for follow up visit for  anemia, thrombocytopenia,  weight loss.   Patient has multiple hospitalization recently due to recent MRSA bacteremia secondary to cavitary pneumonia, PE on Eliquis  which was hold due to progressive anemia, thrombocytopenia, hypothyroidism on synthroid .  He reports feeling tired, does not have much appetite.  No fever or chills.  Per inpatient discharge summary, he was discharged [facility]. Confirmed with  facility that patient eventually declined hospice.    MEDICAL HISTORY:  Past Medical History:  Diagnosis Date   B12 deficiency anemia 08/03/2019   Hypercholesterolemia    Hypertension    Vertigo    last episode approx 09/2018    SURGICAL HISTORY: Past Surgical History:  Procedure Laterality Date   APPENDECTOMY     CATARACT EXTRACTION Bilateral    COLONOSCOPY N/A 11/02/2023   Procedure: COLONOSCOPY;  Surgeon: Quintin Buckle, DO;  Location: [facility] ENDOSCOPY;  Service: Gastroenterology;  Laterality: N/A;   COLONOSCOPY WITH PROPOFOL  N/A 12/24/2018   Procedure: COLONOSCOPY WITH PROPOFOL ;  Surgeon: Marnee Sink, MD;  Location: [facility] ENDOSCOPY;  Service: Endoscopy;  Laterality: N/A;   COLONOSCOPY WITH PROPOFOL  N/A 12/13/2019   Procedure: COLONOSCOPY WITH BIOPSY ;  Surgeon: Marnee Sink, MD;  Location[facility];  Service: Endoscopy;  Laterality: N/A;  priority 3   ESOPHAGOGASTRODUODENOSCOPY N/A 11/02/2023   Procedure: EGD (ESOPHAGOGASTRODUODENOSCOPY);  Surgeon: Quintin Buckle, DO;  Location: [facility] ENDOSCOPY;  Service: Gastroenterology;  Laterality: N/A;   IR CYSTOSTOMY TUBE PLACEMENT/BLADDER ASPIRATION  08/26/2023   PLEURAL SCARIFICATION  05/2015   POLYPECTOMY N/A 12/13/2019   Procedure: POLYPECTOMY;  Surgeon: Marnee Sink, MD;  Location: [facility]GERY [facility];  Service: Endoscopy;  Laterality: N/A;   TEE WITHOUT CARDIOVERSION N/A 10/07/2023   Procedure: ECHOCARDIOGRAM, TRANSESOPHAGEAL;  Surgeon: Alluri, Odessa Bene, MD;  Location: [facility] ORS;  Service: Cardiovascular;  Laterality: N/A;    SOCIAL HISTORY: Social History   Socioeconomic History   Marital status: Married    Spouse name: Not on file   Number of children: 0   Years of education: Not on file   Highest education level: 12th grade  Occupational History   Occupation: retired  Tobacco Use   Smoking status: Never   Smokeless tobacco: Never  Vaping Use   Vaping status: Never Used  Substance and Sexual Activity    Alcohol  use: No    Alcohol /week: 0.0 standard drinks of alcohol    Drug use: No   Sexual activity: Not on file  Other Topics Concern   Not on file  Social History Narrative   Lives with [name], wife.    Social Drivers of Corporate investment banker Strain: Low Risk  (09/25/2021)   Overall Financial Resource Strain (CARDIA)    Difficulty of Paying Living Expenses: Not hard at all  Food Insecurity: No Food Insecurity (10/31/2023)   Hunger Vital Sign    Worried About Running Out of Food in the Last Year: Never true    Ran Out of Food in the Last Year: Never true  Transportation Needs: No Transportation Needs (10/31/2023)   PRAPARE - Administrator, Civil Service (Medical): No    Lack of Transportation (Non-Medical): No  Physical Activity: Insufficiently Active (09/25/2021)   Exercise Vital Sign    Days of Exercise per Week: 2 days    Minutes of Exercise per Session: 60 min  Stress: No Stress Concern Present (11/24/2019)   [name]    Feeling of Stress : Not at all  Social Connections: Moderately Isolated (  10/31/2023)   Social Connection and Isolation Panel [NHANES]    Frequency of Communication with Friends and Family: Twice a week    Frequency of Social Gatherings with Friends and Family: More than three times a week    Attends Religious Services: Never    Database administrator or Organizations: No    Attends Banker Meetings: Never    Marital Status: Married  Catering manager Violence: Not At Risk (10/31/2023)   Humiliation, Afraid, Rape, and Kick questionnaire    Fear of Current or Ex-Partner: No    Emotionally Abused: No    Physically Abused: No    Sexually Abused: No    FAMILY HISTORY: Family History  Problem Relation Age of Onset   Hypertension Mother    Hyperlipidemia Mother    Heart attack Father    Hypertension Father    CVA Father    ALS Brother    Prostate cancer Brother      ALLERGIES:  has no known allergies.  MEDICATIONS:  Current Outpatient Medications  Medication Sig Dispense Refill   acetaminophen  (TYLENOL ) 325 MG tablet Take 2 tablets (650 mg total) by mouth every 6 (six) hours as needed for mild pain (pain score 1-3), headache or fever (or Fever >/= 101).     aspirin  EC 81 MG tablet Take 81 mg by mouth daily. Swallow whole.     atorvastatin  (LIPITOR) 10 MG tablet Take 10 mg by mouth daily.     dextromethorphan -guaiFENesin  (MUCINEX  DM) 30-600 MG 12hr tablet Take 1 tablet by mouth 2 (two) times daily as needed for cough.     feeding supplement (ENSURE ENLIVE / ENSURE PLUS) LIQD Take 237 mLs by mouth 3 (three) times daily between meals.     hydrochlorothiazide (HYDRODIURIL) 12.5 MG tablet Take 12.5 mg by mouth daily.     iron  polysaccharides (NIFEREX) 150 MG capsule Take 1 capsule (150 mg total) by mouth daily. Can be any iron  supplement.     levothyroxine  (SYNTHROID ) 50 MCG tablet Take 1 tablet (50 mcg total) by mouth daily at 6 (six) AM.     linezolid  (ZYVOX ) 600 MG tablet Take 600 mg by mouth 2 (two) times daily.     midodrine  (PROAMATINE ) 10 MG tablet Take 1 tablet (10 mg total) by mouth 3 (three) times daily with meals.     Multiple Vitamin (MULTIVITAMIN WITH MINERALS) TABS tablet Take 1 tablet by mouth daily.     ondansetron  (ZOFRAN ) 4 MG tablet Take 4 mg by mouth every 8 (eight) hours as needed for nausea or vomiting.     pantoprazole  (PROTONIX ) 40 MG tablet Take 1 tablet (40 mg total) by mouth 2 (two) times daily.     No current facility-administered medications for this visit.    Review of Systems  Constitutional:  Positive for appetite change, fatigue and unexpected weight change. Negative for chills and fever.  HENT:   Negative for hearing loss and voice change.   Eyes:  Negative for eye problems and icterus.  Respiratory:  Negative for chest tightness, cough and shortness of breath.   Cardiovascular:  Negative for chest pain and leg  swelling.  Gastrointestinal:  Negative for abdominal distention and abdominal pain.  Endocrine: Negative for hot flashes.  Genitourinary:  Negative for difficulty urinating, dysuria and frequency.   Musculoskeletal:  Negative for arthralgias.  Skin:  Negative for itching and rash.  Neurological:  Negative for light-headedness and numbness.  Hematological:  Negative for adenopathy. Does not bruise/bleed easily.  Psychiatric/Behavioral:  Negative for confusion.    PHYSICAL EXAMINATION: ECOG PERFORMANCE STATUS: 2 - Symptomatic, <50% confined to bed Vitals:   11/07/23 1127 11/07/23 1150  BP: (!) 85/59 (S) (!) 86/58  Pulse: (!) 58   Resp: 18   Temp: 97.6 F (36.4 C)   SpO2: 100%    Filed Weights   11/07/23 1127  Weight: 156 lb (70.8 kg)    Physical Exam Constitutional:      General: He is not in acute distress.    Comments: Frail appearance  HENT:     Head: Normocephalic and atraumatic.  Eyes:     General: No scleral icterus. Cardiovascular:     Rate and Rhythm: Normal rate.  Pulmonary:     Effort: Pulmonary effort is normal. No respiratory distress.     Breath sounds: No wheezing.  Abdominal:     General: Bowel sounds are normal. There is no distension.     Palpations: Abdomen is soft.  Musculoskeletal:        General: No deformity. Normal range of motion.     Cervical back: Normal range of motion and neck supple.  Skin:    General: Skin is warm and dry.     Findings: No erythema or rash.  Neurological:     Mental Status: He is alert and oriented to person, place, and time. Mental status is at baseline.  Psychiatric:        Mood and Affect: Mood normal.     LABORATORY DATA:  I have reviewed the data as listed    Latest Ref Rng & Units 11/07/2023   12:29 PM 11/05/2023    3:52 AM 11/04/2023    4:52 AM  CBC  WBC 4.0 - 10.5 K/uL 7.6  3.4  3.6   Hemoglobin 13.0 - 17.0 g/dL 8.8  7.2  7.2   Hematocrit 39.0 - 52.0 % 26.5  21.1  21.5   Platelets 150 - 400 K/uL 135  63   61       Latest Ref Rng & Units 11/05/2023    3:52 AM 11/04/2023    4:52 AM 11/03/2023    5:16 AM  CMP  Glucose 70 - 99 mg/dL 79  80  81   BUN 8 - 23 mg/dL 32  32  36   Creatinine 0.61 - 1.24 mg/dL 1.61  0.96  0.45   Sodium 135 - 145 mmol/L 133  134  133   Potassium 3.5 - 5.1 mmol/L 4.3  4.6  4.9   Chloride 98 - 111 mmol/L 104  103  104   CO2 22 - 32 mmol/L 23  25  24    Calcium  8.9 - 10.3 mg/dL 7.9  8.0  8.2   Total Protein 6.5 - 8.1 g/dL 4.3  4.5  4.7   Total Bilirubin 0.0 - 1.2 mg/dL 0.4  0.6  0.9   Alkaline Phos 38 - 126 U/L 73  73  78   AST 15 - 41 U/L 22  21  23    ALT 0 - 44 U/L 23  22  28        RADIOGRAPHIC STUDIES: I have personally reviewed the radiological images as listed and agreed with the findings in the report. DG Chest Port 1 View Result Date: 10/10/2023 CLINICAL DATA:  Pleural effusion. EXAM: PORTABLE CHEST 1 VIEW COMPARISON:  Oct 08, 2023. FINDINGS: Stable cardiomediastinal silhouette. Stable cavitating lesion seen in right lung. Small pleural effusions are noted. Stable hiatal hernia. IMPRESSION: Stable  large cavitating lesions seen in right lung base. Small pleural effusions. Stable hiatal hernia. Electronically Signed   By: Rosalene Colon M.D.   On: 10/10/2023 13:18   ECHOCARDIOGRAM COMPLETE Result Date: 10/09/2023    ECHOCARDIOGRAM REPORT   Patient Name:   Zachary [name] Date of Exam: 10/08/2023 Medical Rec #:  [id number]           Height:       74.0 in Accession #:    [id number]          Weight:       125.0 lb Date of Birth:  07/03/1940           BSA:          1.780 m Patient Age:    82 years            BP:           105/63 mmHg Patient Gender: M                   HR:           100 bpm. Exam Location:  [facility] Procedure: 2D Echo, Cardiac Doppler and Color Doppler (Both Spectral and Color            Flow Doppler were utilized during procedure). Indications:     Pulm embolis  History:         Patient has no prior history of Echocardiogram examinations.                  CAD;  Risk Factors:Hypertension.  Sonographer:     Gaston Karvonen, FE, PE Referring Phys:  [id number] CARALYN HUDSON Diagnosing Phys: Lida Reeks Alluri IMPRESSIONS  1. Left ventricular ejection fraction, by estimation, is 35 to 40%. The left ventricle has moderately decreased function. The left ventricle demonstrates global hypokinesis. There is mild left ventricular hypertrophy. Left ventricular diastolic parameters are indeterminate.  2. Right ventricular systolic function is normal. The right ventricular size is normal.  3. The mitral valve is normal in structure. Mild mitral valve regurgitation.  4. Low flow low gradient, mild to moderate aortic stenosis. The aortic valve is tricuspid. Aortic valve regurgitation is mild to moderate.  5. There is mild dilatation of the aortic root, measuring 42 mm. There is mild dilatation of the ascending aorta, measuring 41 mm. FINDINGS  Left Ventricle: Left ventricular ejection fraction, by estimation, is 35 to 40%. The left ventricle has moderately decreased function. The left ventricle demonstrates global hypokinesis. The left ventricular internal cavity size was normal in size. There is mild left ventricular hypertrophy. Left ventricular diastolic parameters are indeterminate. Right Ventricle: The right ventricular size is normal. No increase in right ventricular wall thickness. Right ventricular systolic function is normal. Left Atrium: Left atrial size was normal in size. Right Atrium: Right atrial size was normal in size. Pericardium: Trivial pericardial effusion is present. The pericardial effusion is anterior to the right ventricle. Mitral Valve: The mitral valve is normal in structure. Mild mitral valve regurgitation. Tricuspid Valve: The tricuspid valve is normal in structure. Tricuspid valve regurgitation is mild. Aortic Valve: Low flow low gradient, mild to moderate aortic stenosis. The aortic valve is tricuspid. Aortic valve regurgitation is mild to moderate. Aortic  regurgitation PHT measures 490 msec. Aortic valve mean gradient measures 12.0 mmHg. Aortic valve peak gradient measures 22.1 mmHg. Aortic valve area, by VTI measures 1.20 cm. Pulmonic Valve: The pulmonic valve was not well visualized. Pulmonic valve regurgitation  is mild. Aorta: The ascending aorta was not well visualized. There is mild dilatation of the aortic root, measuring 42 mm. There is mild dilatation of the ascending aorta, measuring 41 mm. IAS/Shunts: The atrial septum is grossly normal.  LEFT VENTRICLE PLAX 2D LVIDd:         4.60 cm LVIDs:         4.10 cm LV PW:         1.20 cm LV IVS:        1.20 cm LVOT diam:     2.30 cm LV SV:         53 LV SV Index:   30 LVOT Area:     4.15 cm  RIGHT VENTRICLE RV Basal diam:  3.50 cm RV Mid diam:    3.60 cm RV S prime:     15.30 cm/s TAPSE (M-mode): 1.8 cm LEFT ATRIUM           Index        RIGHT ATRIUM           Index LA Vol (A4C): 42.1 ml 23.65 ml/m  RA Area:     18.70 cm                                    RA Volume:   52.20 ml  29.33 ml/m  AORTIC VALVE AV Area (Vmax):    1.37 cm AV Area (Vmean):   1.36 cm AV Area (VTI):     1.20 cm AV Vmax:           235.00 cm/s AV Vmean:          161.000 cm/s AV VTI:            0.444 m AV Peak Grad:      22.1 mmHg AV Mean Grad:      12.0 mmHg LVOT Vmax:         77.40 cm/s LVOT Vmean:        52.800 cm/s LVOT VTI:          0.128 m LVOT/AV VTI ratio: 0.29 AI PHT:            490 msec  AORTA Ao Root diam: 4.20 cm Ao Asc diam:  4.10 cm TRICUSPID VALVE TR Peak grad:   25.0 mmHg TR Vmax:        250.00 cm/s  SHUNTS Systemic VTI:  0.13 m Systemic Diam: 2.30 cm Joetta Mustache Electronically signed by Joetta Mustache Signature Date/Time: 10/09/2023/9:00:43 AM    Final    DG Chest Port 1 View Result Date: 10/08/2023 CLINICAL DATA:  Status post right thoracentesis. EXAM: PORTABLE CHEST 1 VIEW COMPARISON:  Oct 07, 2023. FINDINGS: No definite pneumothorax is noted status post thoracentesis. No significant residual pleural effusion is noted.  As described on prior studies. IMPRESSION: No definite pneumothorax status post thoracentesis. Electronically Signed   By: Rosalene Colon M.D.   On: 10/08/2023 13:18   US  THORACENTESIS ASP PLEURAL SPACE W/IMG GUIDE Result Date: 10/08/2023 INDICATION: 83 year old male with MRSA pneumonia, lung empyema, bilateral pleural effusions. IR was requested for diagnostic and therapeutic right-sided thoracentesis. EXAM: ULTRASOUND GUIDED DIAGNOSTIC AND THERAPEUTIC THORACENTESIS MEDICATIONS: 4 cc of 1% lidocaine  COMPLICATIONS: None immediate. PROCEDURE: An ultrasound guided thoracentesis was thoroughly discussed with the patient and questions answered. The benefits, risks, alternatives and complications were also discussed. The patient understands and wishes to proceed with the  procedure. Written consent was obtained. Ultrasound was performed to localize and mark an adequate pocket of fluid in the right chest. The area was then prepped and draped in the normal sterile fashion. 1% Lidocaine  was used for local anesthesia. Under ultrasound guidance a 6 Fr Safe-T-Centesis catheter was introduced. Thoracentesis was performed. The catheter was removed and a dressing applied. FINDINGS: A total of approximately 300 cc of clear, straw-colored fluid fluid was removed. Samples were sent to the laboratory as requested by the clinical team. IMPRESSION: Successful ultrasound guided right thoracentesis yielding 300 cc of pleural fluid. Procedure performed by Lambert Pillion, PA-C Electronically Signed   By: Fernando Hoyer M.D.   On: 10/08/2023 12:27   ECHO TEE Result Date: 10/07/2023    TRANSESOPHOGEAL ECHO REPORT   Patient Name:   Zachary [name] Date of Exam: 10/07/2023 Medical Rec #:  [id number]           Height:       74.0 in Accession #:    [id number]          Weight:       125.0 lb Date of Birth:  1941/03/29           BSA:          1.780 m Patient Age:    82 years            BP:           95/62 mmHg Patient Gender: M                    HR:           51 bpm. Exam Location:  [facility] Procedure: Transesophageal Echo, Cardiac Doppler and Color Doppler (Both            Spectral and Color Flow Doppler were utilized during procedure). Indications:     Not listed on TEE check-in sheet  History:         Patient has no prior history of Echocardiogram examinations.                  Risk Factors:Hypertension.  Sonographer:     Broadus Canes Referring Phys:  [id number] CARALYN HUDSON Diagnosing Phys: Joetta Mustache PROCEDURE: After discussion of the risks and benefits of a TEE, an informed consent was obtained from the patient. The transesophogeal probe was passed without difficulty through the esophogus of the patient. Local oropharyngeal anesthetic was provided with Cetacaine . Sedation performed by different physician. The patient was monitored while under deep sedation. Image quality was good. The patient's vital signs; including heart rate, blood pressure, and oxygen  saturation; remained stable throughout the  procedure. The patient developed no complications during the procedure.  IMPRESSIONS  1. Left ventricular ejection fraction, by estimation, is 35 to 40%. The left ventricle has moderately decreased function.  2. Right ventricular systolic function is mildly reduced. The right ventricular size is normal.  3. Left atrial size was severely dilated. No left atrial/left atrial appendage thrombus was detected.  4. The mitral valve is normal in structure. Mild mitral valve regurgitation.  5. Low flow low gradient aortic stenosis, moderate. The aortic valve is tricuspid. Aortic valve regurgitation is mild to moderate. Conclusion(s)/Recommendation(s): No evidence of vegetation/infective endocarditis on this transesophageael echocardiogram. FINDINGS  Left Ventricle: Left ventricular ejection fraction, by estimation, is 35 to 40%. The left ventricle has moderately decreased function. The left ventricular internal cavity size was normal in size. Right Ventricle: The  right ventricular  size is normal. No increase in right ventricular wall thickness. Right ventricular systolic function is mildly reduced. Left Atrium: Left atrial size was severely dilated. No left atrial/left atrial appendage thrombus was detected. Right Atrium: Right atrial size was normal in size. Pericardium: There is no evidence of pericardial effusion. Mitral Valve: The mitral valve is normal in structure. Mild mitral valve regurgitation. Tricuspid Valve: The tricuspid valve is normal in structure. Tricuspid valve regurgitation is mild. Aortic Valve: Low flow low gradient aortic stenosis, moderate. The aortic valve is tricuspid. Aortic valve regurgitation is mild to moderate. Aortic valve mean gradient measures 11.5 mmHg. Aortic valve peak gradient measures 20.9 mmHg. Aortic valve area,  by VTI measures 1.40 cm. Pulmonic Valve: The pulmonic valve was normal in structure. Pulmonic valve regurgitation is trivial. Aorta: The aortic root is normal in size and structure. IAS/Shunts: No atrial level shunt detected by color flow Doppler.  LEFT VENTRICLE PLAX 2D LVOT diam:     2.60 cm LV SV:         56 LV SV Index:   32 LVOT Area:     5.31 cm  AORTIC VALVE AV Area (Vmax):    1.46 cm AV Area (Vmean):   1.34 cm AV Area (VTI):     1.40 cm AV Vmax:           228.50 cm/s AV Vmean:          155.000 cm/s AV VTI:            0.402 m AV Peak Grad:      20.9 mmHg AV Mean Grad:      11.5 mmHg LVOT Vmax:         62.70 cm/s LVOT Vmean:        39.200 cm/s LVOT VTI:          0.106 m LVOT/AV VTI ratio: 0.26  SHUNTS Systemic VTI:  0.11 m Systemic Diam: 2.60 cm Joetta Mustache Electronically signed by Joetta Mustache Signature Date/Time: 10/07/2023/2:00:53 PM    Final    DG Chest Port 1 View Result Date: 10/07/2023 CLINICAL DATA:  Weakness and fever.  Pleural effusion. EXAM: PORTABLE CHEST 1 VIEW COMPARISON:  CT chest, abdomen, and pelvis dated 10/06/2023. Chest radiograph dated 09/30/2023. FINDINGS: The heart size and mediastinal  contours are unchanged. Aortic atherosclerosis. Large cavitary lesion is again noted in the right lung with surrounding airspace opacities. Similar small bilateral pleural effusions with associated bibasilar atelectasis. Unchanged large hiatal hernia. No pneumothorax. No acute osseous abnormality. IMPRESSION: 1. Large cavitary lesion is again noted in the right lung with surrounding airspace opacities, concerning for cavitary pneumonia, better evaluated on the prior CT chest, abdomen, and pelvis dated 10/06/2023. 2. Similar small bilateral pleural effusions with associated bibasilar atelectasis. Electronically Signed   By: Mannie Seek M.D.   On: 10/07/2023 12:42   CT CHEST ABDOMEN PELVIS W CONTRAST Result Date: 10/06/2023 CLINICAL DATA:  Worsening leukocytosis and transaminitis despite antibiotic treatment. Unintentional weight loss. Known cavitary pneumonia. Clinical concern for possible abscess or malignancy. EXAM: CT CHEST, ABDOMEN, AND PELVIS WITH CONTRAST TECHNIQUE: Multidetector CT imaging of the chest, abdomen and pelvis was performed following the standard protocol during bolus administration of intravenous contrast. RADIATION DOSE REDUCTION: This exam was performed according to the departmental dose-optimization program which includes automated exposure control, adjustment of the mA and/or kV according to patient size and/or use of iterative reconstruction technique. CONTRAST:  80mL OMNIPAQUE  IOHEXOL  300 MG/ML  SOLN COMPARISON:  Chest CT dated 10/01/2023.  Abdomen and pelvis CT dated 08/08/2023. FINDINGS: CT CHEST FINDINGS Cardiovascular: There is a moderate-sized pulmonary embolus in the distal left main pulmonary artery and extending into the proximal left upper lobe pulmonary artery without occlusion. A smaller embolus is demonstrated in a right lower lobe pulmonary artery branch. These are not sufficient to cause right heart strain. The previously demonstrated 4.5 cm ascending thoracic aorta  aneurysm currently measures 4.3 cm. Enlarged central pulmonary arteries with a main pulmonary artery measuring 3.6 cm in diameter. Enlarged heart. Atheromatous calcifications, including the coronary arteries and aorta. Slight increase in size of a small pericardial effusion with a maximum thickness of 8 mm. Mediastinum/Nodes: No enlarged mediastinal, hilar, or axillary lymph nodes. Thyroid  gland, trachea, and esophagus demonstrate no significant findings. Again demonstrated is a large hiatal hernia with an intrathoracic stomach. Lungs/Pleura: Increased size and confluence of the large cavitary lesion previously demonstrated in the right upper lobe with a moderately thick, irregular peripheral rind and adjacent airspace opacity. This continues to contain some fluid, with mild improvement. This area currently measures 13.2 x 8.2 cm on image number 94/4, previously 8.6 x 6.5 cm in corresponding dimensions. Continued patchy airspace opacity in the adjacent portions of the right upper lobe. These areas have increased some in size and decreased some in density. The previously demonstrated right middle lobe nodular density has cavitated with 2 adjacent areas of cavitation and patchy opacity currently demonstrated in that area. Small to moderate-sized bilateral pleural effusions, increased on the right and without significant change on the left. Mild bilateral lower lobe atelectasis. Musculoskeletal: Mild thoracic spine degenerative changes. No evidence of bony metastatic disease. CT ABDOMEN PELVIS FINDINGS Hepatobiliary: No focal liver abnormality is seen. No gallstones, gallbladder wall thickening, or biliary dilatation. Pancreas: Unremarkable. No pancreatic ductal dilatation or surrounding inflammatory changes. Spleen: Normal in size without focal abnormality. Adrenals/Urinary Tract: Moderate dilatation of the left renal collecting system and ureter with mild progression mild dilatation of the right renal collecting  system and ureter with mild improvement. Again demonstrated is asymmetrical anterior bladder wall thickening, thicker on the right, with improvement. A suprapubic bladder catheter is currently demonstrated. Stable fat density left adrenal myelolipoma and probable lipid poor left adrenal adenoma. Unremarkable right adrenal gland. Stomach/Bowel: Extensive sigmoid and descending colon diverticulosis without evidence of diverticulitis. The appendix is not visualized. Again noted is the entire stomach within a large hiatal hernia. Unremarkable small bowel. Vascular/Lymphatic: Atheromatous arterial calcifications without aneurysm. No enlarged lymph nodes. Reproductive: Mildly enlarged and heterogeneous prostate gland with coarse calcifications. Other: No abdominal wall hernia or abnormality. No abdominopelvic ascites. Musculoskeletal: Moderate lumbar spine degenerative changes and mild scoliosis. IMPRESSION: 1. Bilateral pulmonary emboli, as described above. These are not sufficient to cause right heart strain. 2. Increased size and confluence of the large cavitary lesion previously demonstrated in the right upper lobe with a moderately thick, irregular peripheral rind and adjacent airspace opacity. This continues to contain some fluid, with mild improvement. This is most likely due to a cavitary pneumonia. A cavitary neoplasm is less likely since it was not present on the abdomen and pelvis CT obtained on 08/08/2023. 3. Continued patchy airspace opacity in the adjacent portions of the right upper lobe. These areas have increased some in size and decreased some in density, compatible with ongoing pneumonia. 4. The previously demonstrated right middle lobe nodular density has cavitated with 2 adjacent areas of cavitation and patchy opacity currently demonstrated in that area, also compatible with cavitary pneumonia. 5. Small to moderate-sized  bilateral pleural effusions, increased on the right and without significant  change on the left. 6. Mild bilateral lower lobe atelectasis. 7. Slight increase in size of a small pericardial effusion. 8. 4.3 cm ascending thoracic aorta aneurysm. Recommend annual imaging followup by CTA or MRA. This recommendation follows 2010 ACCF/AHA/AATS/ACR/ASA/SCA/SCAI/SIR/STS/SVM Guidelines for the Diagnosis and Management of Patients with Thoracic Aortic Disease. Circulation. 2010; 121: Z610-R604. Aortic aneurysm NOS (ICD10-I71.9) 9. Enlarged central pulmonary arteries, suggesting pulmonary arterial hypertension. 10. Cardiomegaly. 11. Large hiatal hernia with an intrathoracic stomach. 12. Moderate dilatation of the left renal collecting system and ureter with mild progression. Mild dilatation of the right renal collecting system and ureter with mild improvement. This remains compatible with bilateral ureteral obstruction at the level of the ureterovesical junction 13. Asymmetrical anterior bladder wall thickening, thicker on the right, with improvement. A suprapubic bladder catheter is currently demonstrated. 14. Stable left adrenal myelolipoma and probable lipid poor adenoma. 15. Extensive sigmoid and descending colon diverticulosis without evidence of diverticulitis. 16. Mildly enlarged and heterogeneous prostate gland. 17.  Calcific coronary artery and aortic atherosclerosis. Aortic Atherosclerosis (ICD10-I70.0). Critical Value/emergent results were called by telephone at the time of interpretation on 10/06/2023 at 2:14 pm to provider Melodi Sprung , who verbally acknowledged these results. Electronically Signed   By: Catherin Closs M.D.   On: 10/06/2023 14:32   CT CHEST WO CONTRAST Result Date: 10/01/2023 CLINICAL DATA:  Weakness, fever, abnormal x-ray EXAM: CT CHEST WITHOUT CONTRAST TECHNIQUE: Multidetector CT imaging of the chest was performed following the standard protocol without IV contrast. RADIATION DOSE REDUCTION: This exam was performed according to the departmental dose-optimization  program which includes automated exposure control, adjustment of the mA and/or kV according to patient size and/or use of iterative reconstruction technique. COMPARISON:  03/04/2022, 08/08/2023, 09/30/2023 FINDINGS: Cardiovascular: Unenhanced imaging of the heart is unremarkable, without significant pericardial effusion. 4.5 cm ascending thoracic aortic aneurysm. Atherosclerosis of the aorta and coronary vasculature. Assessment of the vascular lumen cannot be performed without intravenous contrast. Mediastinum/Nodes: No enlarged mediastinal or axillary lymph nodes. Thyroid  gland, trachea, and esophagus demonstrate no significant findings. Moderate hiatal hernia. Lungs/Pleura: There is a thick walled right upper lobe cavity measuring 9.2 x 7.2 x 7.5 cm, with gas fluid level. There is surrounding dense airspace disease within the right upper lobe as well as within the right middle lobe. Findings are most consistent with cavitary pneumonia, new since the 08/08/2023 CT. There are small free-flowing bilateral pleural effusions, right greater than left. Dependent consolidation within the lower lobes most consistent with atelectasis. No pneumothorax. Central airways are patent. Upper Abdomen: Upper abdominal ascites. Musculoskeletal: No acute or destructive bony abnormalities. Reconstructed images demonstrate no additional findings. IMPRESSION: 1. Large thick walled right upper lobe cavity with gas fluid level, with dense airspace disease involving the right upper and right middle lobes, most consistent with cavitating pneumonia. This has developed since the abdominal CT 08/08/2023. 2. Small free-flowing bilateral pleural effusions. 3. Moderate hiatal hernia. 4. 4.5 cm ascending thoracic aortic aneurysm. Ascending thoracic aortic aneurysm. Recommend semi-annual imaging followup by CTA or MRA and referral to cardiothoracic surgery if not already obtained. This recommendation follows 2010  ACCF/AHA/AATS/ACR/ASA/SCA/SCAI/SIR/STS/SVM Guidelines for the Diagnosis and Management of Patients With Thoracic Aortic Disease. Circulation. 2010; 121: V409-W119. Aortic aneurysm NOS (ICD10-I71.9) 5.  Aortic Atherosclerosis (ICD10-I70.0). 6. Upper abdominal ascites. Electronically Signed   By: Bobbye Burrow M.D.   On: 10/01/2023 20:12   DG Chest Port 1 View Result Date: 09/30/2023 CLINICAL DATA:  83 year old  male with possible sepsis. EXAM: PORTABLE CHEST 1 VIEW COMPARISON:  Chest CT 03/04/2022 and earlier. FINDINGS: Portable AP supine views at 0600 hours. Chronic retrocardiac moderate to large gastric hiatal hernia. Stable cardiac size and mediastinal contours. Chronic pulmonary hyperinflation. Patchy and indistinct new multifocal right lung opacity in both the upper and lower lung. No superimposed pneumothorax, pulmonary edema, pleural effusion. No definite left lung involvement. No acute osseous abnormality identified. Nondilated gas-filled bowel in the upper abdomen. IMPRESSION: 1. Chronic pulmonary hyperinflation with indistinct new multifocal right lung opacity suspicious for developing multilobar bronchopneumonia/pneumonia in this setting. No pleural effusion. 2. Chronic moderate to large hiatal hernia. Electronically Signed   By: Marlise Simpers M.D.   On: 09/30/2023 06:25   CT Cervical Spine Wo Contrast Result Date: 09/30/2023 CLINICAL DATA:  83 year old male status post fall in garage ester day. Generalized weakness, decreased p.o. Hematuria. Shortness of breath. EXAM: CT CERVICAL SPINE WITHOUT CONTRAST TECHNIQUE: Multidetector CT imaging of the cervical spine was performed without intravenous contrast. Multiplanar CT image reconstructions were also generated. RADIATION DOSE REDUCTION: This exam was performed according to the departmental dose-optimization program which includes automated exposure control, adjustment of the mA and/or kV according to patient size and/or use of iterative reconstruction  technique. COMPARISON:  Head CT today. FINDINGS: Alignment: Maintained cervical lordosis. Cervicothoracic junction alignment is within normal limits. Bilateral posterior element alignment is within normal limits. Skull base and vertebrae: Bone mineralization is within normal limits for age. Visualized skull base is intact. No atlanto-occipital dissociation. C1 and C2 appear intact and aligned. No acute osseous abnormality identified. Soft tissues and spinal canal: No prevertebral fluid or swelling. No visible canal hematoma. Bulky calcified carotid atherosclerosis in the neck, severe on the right (series 3, image 58). Disc levels: Generally mild for age cervical spine degeneration and capacious CT appearance of the cervical spinal canal at most levels. Upper chest: Visible upper thoracic levels appear intact. Clear lung apices with questionable emphysema. Trace intravenous gas at the thoracic inlet, likely IV access related. IMPRESSION: 1. No acute traumatic injury identified in the cervical spine. Mild for age cervical spine degeneration. 2. Calcified carotid atherosclerosis in the neck, Severe at the Right ICA. Electronically Signed   By: Marlise Simpers M.D.   On: 09/30/2023 05:57   CT HEAD WO CONTRAST ( ) Result Date: 09/30/2023 CLINICAL DATA:  83 year old male status post fall in garage ester day. Generalized weakness, decreased p.o. Hematuria. Shortness of breath. EXAM: CT HEAD WITHOUT CONTRAST TECHNIQUE: Contiguous axial images were obtained from the base of the skull through the vertex without intravenous contrast. RADIATION DOSE REDUCTION: This exam was performed according to the departmental dose-optimization program which includes automated exposure control, adjustment of the mA and/or kV according to patient size and/or use of iterative reconstruction technique. COMPARISON:  Brain MRI 04/12/2020.  Head CT 01/20/2017. FINDINGS: Brain: Cerebral volume stable and within normal limits for age. Choroid plexus  cysts, normal variant. No midline shift, ventriculomegaly, mass effect, evidence of mass lesion, intracranial hemorrhage or evidence of cortically based acute infarction. Gray-white differentiation stable and within normal limits for age. Vascular: Advanced calcified atherosclerosis at the skull base. No suspicious intracranial vascular hyperdensity. Skull: Stable, intact. Sinuses/Orbits: Visualized paranasal sinuses and mastoids are clear. Other: No acute orbit or scalp soft tissue injury identified. Stable postoperative changes to the globes. IMPRESSION: 1. No acute intracranial abnormality or acute traumatic injury identified. 2. Stable and negative for age noncontrast CT appearance of the brain. Electronically Signed   By: Marvina Slough  Del Favia M.D.   On: 09/30/2023 05:55   IR CYSTOSTOMY TUBE PLACEMENT/BLADDER ASPIRATION Result Date: 08/26/2023 INDICATION: 83 year old male presents for suprapubic catheter placement EXAM: IMAGE GUIDED SUPRAPUBIC CATHETER PLACEMENT COMPARISON:  CT 08/08/2023 MEDICATIONS: None ANESTHESIA/SEDATION: Moderate (conscious) sedation was employed during this procedure. A total of Versed  1.0 mg and Fentanyl  50 mcg was administered intravenously by the radiology nurse. Total intra-service moderate Sedation Time: 14 minutes. The patient's level of consciousness and vital signs were monitored continuously by radiology nursing throughout the procedure under my direct supervision. CONTRAST:  5 cc-administered into the collecting system(s) FLUOROSCOPY: Radiation Exposure Index (as provided by the fluoroscopic device): 1 mGy Kerma COMPLICATIONS: None PROCEDURE: Informed written consent was obtained from the patient after a thorough discussion of the procedural risks, benefits and alternatives. All questions were addressed. Maximal Sterile Barrier Technique was utilized including caps, mask, sterile gowns, sterile gloves, sterile drape, hand hygiene and skin antiseptic. A timeout was performed prior to the  initiation of the procedure. Patient was positioned supine under the image intensifier. The pelvic region was prepped and draped in the usual sterile fashion. Ultrasound images were performed with images stored sent to PACs. 1% lidocaine  was used for local anesthesia of the skin and subcutaneous tissues. Small stab incision was made with 11 blade scalpel and blunt dissection was performed. Under ultrasound 18 gauge trocar needle was advanced into the urinary bladder in the midline. We confirmed needle tip position the stylet was removed and a wire was advanced into the bladder under ultrasound guidance. Needle was removed. Over the wire, a coaxial system of a 7 mm diameter, 80 mm length balloon and a 16 French [place] tip balloon retention urinary catheter advanced to the soft tissues. The balloon was gently inflated to profile. Once gentle dilation was performed the balloon was deflated and some attain E asleep the balloon and Council tip catheter were advanced into the urinary bladder under fluoroscopic guidance on the wire. Balloon was completely deflated and the balloon catheter was removed with the wire. 8 cc of saline used to inflate the new Council tip balloon retention catheter, which was withdrawn to the bladder wall. Contrast was injected confirming location with images stored and sent to PACs. The indwelling urinary catheter was then removed. Sterile dressing placed. Patient tolerated the procedure well and remained hemodynamically stable throughout. No complications were encountered and no significant blood loss. IMPRESSION: Status post image guided placement of suprapubic 16 French balloon retention Council tip catheter. Signed, Marciano Settles. Dalene Duck, ABVM, RPVI [facility] Radiology PLAN: After approximately 8 weeks to allow the tract to mature, bedside catheter exchange may proceed. Electronically Signed   By: Myrlene Asper D.O.   On: 08/26/2023 13:10

## 2023-11-07 NOTE — Assessment & Plan Note (Addendum)
 Likely secondary to hypothyroidism, recent chemotherapy pneumonia, poor oral intake due to multiple comorbidities.

## 2023-11-07 NOTE — Assessment & Plan Note (Addendum)
 Currently off anticoagulation due to progressive anemia and thrombocytopenia.  Close monitor. His platelet count has improved. Repeat cbc in 2 weeks. If stable, consider resume on low dose Eliquis  2.5mg  BID.

## 2023-11-09 NOTE — Progress Notes (Unsigned)
 11/10/2023 9:29 PM   Zachary [name] 17-Jun-1940 [id number]  Referring provider: [name], MD [address] [place],  [place] [address]  Urological history: 1. High risk hematuria/urinary retention  -non smoker -non-contrast CT (08/2023) - severe distention improved, improved bilateral hydroureteronephrosis and bilateral renal cysts -RUS (08/2023) - bilateral echogenic kidneys, bilateral renal cysts and moderate severity bilateral hydronephrosis, secondary to over distended bladder  -contrast CT (10/2023) - moderate dilatation of the left renal collecting system and ureter with mild progression.  Mild dilatation of the right renal collecting system and ureter with mild improvement compatible with bilateral ureteral obstruction at the level of the UVJ, suprapubic catheter in place  No chief complaint on file.  HPI: Zachary [name] is a 83 y.o. male who presents today for ED follow up.      Previous records reviewed.   He was admitted in April for weakness.   He is now under Hospice care.     PMH: Past Medical History:  Diagnosis Date   B12 deficiency anemia 08/03/2019   Hypercholesterolemia    Hypertension    Vertigo    last episode approx 09/2018    Surgical History: Past Surgical History:  Procedure Laterality Date   APPENDECTOMY     CATARACT EXTRACTION Bilateral    COLONOSCOPY N/A 11/02/2023   Procedure: COLONOSCOPY;  Surgeon: Quintin Buckle, DO;  Location: [facility] ENDOSCOPY;  Service: Gastroenterology;  Laterality: N/A;   COLONOSCOPY WITH PROPOFOL  N/A 12/24/2018   Procedure: COLONOSCOPY WITH PROPOFOL ;  Surgeon: Marnee Sink, MD;  Location: [facility] ENDOSCOPY;  Service: Endoscopy;  Laterality: N/A;   COLONOSCOPY WITH PROPOFOL  N/A 12/13/2019   Procedure: COLONOSCOPY WITH BIOPSY ;  Surgeon: Marnee Sink, MD;  Location: [facility] SURGERY CNTR;  Service: Endoscopy;  Laterality: N/A;  priority 3   ESOPHAGOGASTRODUODENOSCOPY N/A 11/02/2023   Procedure:  EGD (ESOPHAGOGASTRODUODENOSCOPY);  Surgeon: Quintin Buckle, DO;  Location: [facility] ENDOSCOPY;  Service: Gastroenterology;  Laterality: N/A;   IR CYSTOSTOMY TUBE PLACEMENT/BLADDER ASPIRATION  08/26/2023   PLEURAL SCARIFICATION  05/2015   POLYPECTOMY N/A 12/13/2019   Procedure: POLYPECTOMY;  Surgeon: Marnee Sink, MD;  Location: [facility];  Service: Endoscopy;  Laterality: N/A;   TEE WITHOUT CARDIOVERSION N/A 10/07/2023   Procedure: ECHOCARDIOGRAM, TRANSESOPHAGEAL;  Surgeon: Alluri, Odessa Bene, MD;  Location: [facility] ORS;  Service: Cardiovascular;  Laterality: N/A;    Home Medications:  Allergies as of 11/10/2023   No Known Allergies      Medication List        Accurate as of November 09, 2023  9:29 PM. If you have any questions, ask your nurse or doctor.          acetaminophen  325 MG tablet Commonly known as: TYLENOL  Take 2 tablets (650 mg total) by mouth every 6 (six) hours as needed for mild pain (pain score 1-3), headache or fever (or Fever >/= 101).   atorvastatin  10 MG tablet Commonly known as: LIPITOR Take 10 mg by mouth daily.   dextromethorphan -guaiFENesin  30-600 MG 12hr tablet Commonly known as: MUCINEX  DM Take 1 tablet by mouth 2 (two) times daily as needed for cough.   feeding supplement Liqd Take 237 mLs by mouth 3 (three) times daily between meals.   hydrochlorothiazide 12.5 MG tablet Commonly known as: HYDRODIURIL Take 12.5 mg by mouth daily.   iron  polysaccharides 150 MG capsule Commonly known as: NIFEREX Take 1 capsule (150 mg total) by mouth daily. Can be any iron  supplement.   levothyroxine  50  MCG tablet Commonly known as: SYNTHROID  Take 1 tablet (50 mcg total) by mouth daily at 6 (six) AM.   linezolid  600 MG tablet Commonly known as: ZYVOX  Take 600 mg by mouth 2 (two) times daily.   midodrine  10 MG tablet Commonly known as: PROAMATINE  Take 1 tablet (10 mg total) by mouth 3 (three) times daily with meals.   multivitamin with minerals Tabs  tablet Take 1 tablet by mouth daily.   ondansetron  4 MG tablet Commonly known as: ZOFRAN  Take 4 mg by mouth every 8 (eight) hours as needed for nausea or vomiting.   pantoprazole  40 MG tablet Commonly known as: Protonix  Take 1 tablet (40 mg total) by mouth 2 (two) times daily.        Allergies: No Known Allergies  Family History: Family History  Problem Relation Age of Onset   Hypertension Mother    Hyperlipidemia Mother    Heart attack Father    Hypertension Father    CVA Father    ALS Brother    Prostate cancer Brother     Social History:  reports that he has never smoked. He has never used smokeless tobacco. He reports that he does not drink alcohol  and does not use drugs.  ROS: Pertinent ROS in HPI  Physical Exam: There were no vitals taken for this visit.  Constitutional:  Well nourished. Alert and oriented, No acute distress. HEENT: [place] AT, moist mucus membranes.  Trachea midline, no masses. Cardiovascular: No clubbing, cyanosis, or edema. Respiratory: Normal respiratory effort, no increased work of breathing. GI: Abdomen is soft, non tender, non distended, no abdominal masses. Liver and spleen not palpable.  No hernias appreciated.  Stool sample for occult testing is not indicated.   GU: No CVA tenderness.  No bladder fullness or masses.  Patient with circumcised/uncircumcised phallus. ***Foreskin easily retracted***  Urethral meatus is patent.  No penile discharge. No penile lesions or rashes. Scrotum without lesions, cysts, rashes and/or edema.  Testicles are located scrotally bilaterally. No masses are appreciated in the testicles. Left and right epididymis are normal. Rectal: Patient with  normal sphincter tone. Anus and perineum without scarring or rashes. No rectal masses are appreciated. Prostate is approximately *** grams, *** nodules are appreciated. Seminal vesicles are normal. Skin: No rashes, bruises or suspicious lesions. Lymph: No cervical or inguinal  adenopathy. Neurologic: Grossly intact, no focal deficits, moving all 4 extremities. Psychiatric: Normal mood and affect.   Laboratory Data: Lab Results  Component Value Date   WBC 7.6 11/07/2023   HGB 8.8 (L) 11/07/2023   HCT 26.5 (L) 11/07/2023   MCV 95.7 11/07/2023   PLT 135 (L) 11/07/2023   Lab Results  Component Value Date   CREATININE 1.72 (H) 11/05/2023   Lab Results  Component Value Date   AST 22 11/05/2023   Lab Results  Component Value Date   ALT 23 11/05/2023   Urinalysis *** See EPIC and HPI  I have reviewed the labs.   Pertinent Imaging: NA  Suprapubic Cath Change  Patient is present today for a suprapubic catheter change due to urinary retention.  ***ml of water  was drained from the balloon, a ***FR foley cath was removed from the tract with out difficulty.  Site was cleaned and prepped in a sterile fashion with betadine.  A ***FR foley cath was replaced into the tract {dnt complications:20057}. Urine return was noted, 10 ml of sterile water  was inflated into the balloon and a *** bag was attached for drainage.  Patient tolerated well. A night bag was given to patient and proper instruction was given on how to switch bags.    Performed by: ***  Assessment & Plan:    1. Urinary retention - managed with SPT  - SPT exchanged today    No follow-ups on file.  These notes generated with voice recognition software. I apologize for typographical errors.  Briant Camper  [facility] [address]  [address] [place], [place] [address] [phone]

## 2023-11-10 ENCOUNTER — Ambulatory Visit (INDEPENDENT_AMBULATORY_CARE_PROVIDER_SITE_OTHER): Admitting: Urology

## 2023-11-10 ENCOUNTER — Encounter: Payer: Self-pay | Admitting: Urology

## 2023-11-10 VITALS — BP 93/58 | HR 61 | Ht 74.0 in | Wt 149.8 lb

## 2023-11-10 DIAGNOSIS — R339 Retention of urine, unspecified: Secondary | ICD-10-CM

## 2023-11-14 ENCOUNTER — Other Ambulatory Visit: Payer: Self-pay

## 2023-11-14 ENCOUNTER — Ambulatory Visit: Payer: Self-pay | Admitting: Oncology

## 2023-11-14 DIAGNOSIS — D649 Anemia, unspecified: Secondary | ICD-10-CM | POA: Diagnosis not present

## 2023-11-14 NOTE — Telephone Encounter (Signed)
-----   Message from [name] sent at 11/14/2023  8:31 AM EDT ----- Please arrange him to see me next week lab MD cbc thanks.  ----- Message ----- From: Dannis Dy, Lab In [place] Sent: 11/07/2023  12:50 PM EDT To: [name], MD

## 2023-11-14 NOTE — Telephone Encounter (Signed)
 Zachary [name] can you please arrange patient to see Dr. Wilhelmenia Harada next week with labs.

## 2023-11-17 ENCOUNTER — Ambulatory Visit: Admitting: Oncology

## 2023-11-17 ENCOUNTER — Other Ambulatory Visit

## 2023-11-17 DIAGNOSIS — D649 Anemia, unspecified: Secondary | ICD-10-CM | POA: Diagnosis not present

## 2023-11-18 DIAGNOSIS — R531 Weakness: Secondary | ICD-10-CM | POA: Diagnosis not present

## 2023-11-18 DIAGNOSIS — R269 Unspecified abnormalities of gait and mobility: Secondary | ICD-10-CM | POA: Diagnosis not present

## 2023-11-18 DIAGNOSIS — Z741 Need for assistance with personal care: Secondary | ICD-10-CM | POA: Diagnosis not present

## 2023-11-18 DIAGNOSIS — R262 Difficulty in walking, not elsewhere classified: Secondary | ICD-10-CM | POA: Diagnosis not present

## 2023-11-19 ENCOUNTER — Inpatient Hospital Stay: Admitting: Oncology

## 2023-11-19 ENCOUNTER — Inpatient Hospital Stay

## 2023-11-19 DIAGNOSIS — R269 Unspecified abnormalities of gait and mobility: Secondary | ICD-10-CM | POA: Diagnosis not present

## 2023-11-19 DIAGNOSIS — R531 Weakness: Secondary | ICD-10-CM | POA: Diagnosis not present

## 2023-11-19 DIAGNOSIS — R262 Difficulty in walking, not elsewhere classified: Secondary | ICD-10-CM | POA: Diagnosis not present

## 2023-11-19 DIAGNOSIS — D72829 Elevated white blood cell count, unspecified: Secondary | ICD-10-CM | POA: Diagnosis not present

## 2023-11-19 DIAGNOSIS — R8290 Unspecified abnormal findings in urine: Secondary | ICD-10-CM | POA: Diagnosis not present

## 2023-11-19 DIAGNOSIS — Z741 Need for assistance with personal care: Secondary | ICD-10-CM | POA: Diagnosis not present

## 2023-11-19 LAB — ACID FAST CULTURE WITH REFLEXED SENSITIVITIES (MYCOBACTERIA): Acid Fast Culture: NEGATIVE

## 2023-11-20 DIAGNOSIS — J189 Pneumonia, unspecified organism: Secondary | ICD-10-CM | POA: Diagnosis not present

## 2023-11-20 DIAGNOSIS — Z741 Need for assistance with personal care: Secondary | ICD-10-CM | POA: Diagnosis not present

## 2023-11-20 DIAGNOSIS — R269 Unspecified abnormalities of gait and mobility: Secondary | ICD-10-CM | POA: Diagnosis not present

## 2023-11-20 DIAGNOSIS — R600 Localized edema: Secondary | ICD-10-CM | POA: Diagnosis not present

## 2023-11-20 DIAGNOSIS — R262 Difficulty in walking, not elsewhere classified: Secondary | ICD-10-CM | POA: Diagnosis not present

## 2023-11-20 DIAGNOSIS — R531 Weakness: Secondary | ICD-10-CM | POA: Diagnosis not present

## 2023-11-21 DIAGNOSIS — Z741 Need for assistance with personal care: Secondary | ICD-10-CM | POA: Diagnosis not present

## 2023-11-21 DIAGNOSIS — R262 Difficulty in walking, not elsewhere classified: Secondary | ICD-10-CM | POA: Diagnosis not present

## 2023-11-21 DIAGNOSIS — R269 Unspecified abnormalities of gait and mobility: Secondary | ICD-10-CM | POA: Diagnosis not present

## 2023-11-21 DIAGNOSIS — R531 Weakness: Secondary | ICD-10-CM | POA: Diagnosis not present

## 2023-11-23 LAB — ACID FAST CULTURE WITH REFLEXED SENSITIVITIES (MYCOBACTERIA): Acid Fast Culture: NEGATIVE

## 2023-11-24 DIAGNOSIS — R269 Unspecified abnormalities of gait and mobility: Secondary | ICD-10-CM | POA: Diagnosis not present

## 2023-11-24 DIAGNOSIS — R531 Weakness: Secondary | ICD-10-CM | POA: Diagnosis not present

## 2023-11-24 DIAGNOSIS — R262 Difficulty in walking, not elsewhere classified: Secondary | ICD-10-CM | POA: Diagnosis not present

## 2023-11-24 DIAGNOSIS — Z741 Need for assistance with personal care: Secondary | ICD-10-CM | POA: Diagnosis not present

## 2023-11-25 ENCOUNTER — Encounter: Payer: Self-pay | Admitting: Oncology

## 2023-11-25 DIAGNOSIS — R262 Difficulty in walking, not elsewhere classified: Secondary | ICD-10-CM | POA: Diagnosis not present

## 2023-11-25 DIAGNOSIS — R269 Unspecified abnormalities of gait and mobility: Secondary | ICD-10-CM | POA: Diagnosis not present

## 2023-11-25 DIAGNOSIS — D649 Anemia, unspecified: Secondary | ICD-10-CM | POA: Diagnosis not present

## 2023-11-25 DIAGNOSIS — R531 Weakness: Secondary | ICD-10-CM | POA: Diagnosis not present

## 2023-11-25 DIAGNOSIS — Z741 Need for assistance with personal care: Secondary | ICD-10-CM | POA: Diagnosis not present

## 2023-11-26 DIAGNOSIS — Z741 Need for assistance with personal care: Secondary | ICD-10-CM | POA: Diagnosis not present

## 2023-11-26 DIAGNOSIS — R269 Unspecified abnormalities of gait and mobility: Secondary | ICD-10-CM | POA: Diagnosis not present

## 2023-11-26 DIAGNOSIS — R531 Weakness: Secondary | ICD-10-CM | POA: Diagnosis not present

## 2023-11-26 DIAGNOSIS — R262 Difficulty in walking, not elsewhere classified: Secondary | ICD-10-CM | POA: Diagnosis not present

## 2023-11-27 DIAGNOSIS — R531 Weakness: Secondary | ICD-10-CM | POA: Diagnosis not present

## 2023-11-27 DIAGNOSIS — R269 Unspecified abnormalities of gait and mobility: Secondary | ICD-10-CM | POA: Diagnosis not present

## 2023-11-27 DIAGNOSIS — Z741 Need for assistance with personal care: Secondary | ICD-10-CM | POA: Diagnosis not present

## 2023-11-27 DIAGNOSIS — R262 Difficulty in walking, not elsewhere classified: Secondary | ICD-10-CM | POA: Diagnosis not present

## 2023-11-30 DIAGNOSIS — R262 Difficulty in walking, not elsewhere classified: Secondary | ICD-10-CM | POA: Diagnosis not present

## 2023-11-30 DIAGNOSIS — R531 Weakness: Secondary | ICD-10-CM | POA: Diagnosis not present

## 2023-11-30 DIAGNOSIS — R269 Unspecified abnormalities of gait and mobility: Secondary | ICD-10-CM | POA: Diagnosis not present

## 2023-11-30 DIAGNOSIS — Z741 Need for assistance with personal care: Secondary | ICD-10-CM | POA: Diagnosis not present

## 2023-12-01 DIAGNOSIS — R269 Unspecified abnormalities of gait and mobility: Secondary | ICD-10-CM | POA: Diagnosis not present

## 2023-12-01 DIAGNOSIS — R262 Difficulty in walking, not elsewhere classified: Secondary | ICD-10-CM | POA: Diagnosis not present

## 2023-12-01 DIAGNOSIS — Z741 Need for assistance with personal care: Secondary | ICD-10-CM | POA: Diagnosis not present

## 2023-12-01 DIAGNOSIS — R531 Weakness: Secondary | ICD-10-CM | POA: Diagnosis not present

## 2023-12-02 DIAGNOSIS — R262 Difficulty in walking, not elsewhere classified: Secondary | ICD-10-CM | POA: Diagnosis not present

## 2023-12-02 DIAGNOSIS — R269 Unspecified abnormalities of gait and mobility: Secondary | ICD-10-CM | POA: Diagnosis not present

## 2023-12-02 DIAGNOSIS — Z741 Need for assistance with personal care: Secondary | ICD-10-CM | POA: Diagnosis not present

## 2023-12-02 DIAGNOSIS — R531 Weakness: Secondary | ICD-10-CM | POA: Diagnosis not present

## 2023-12-04 DIAGNOSIS — N1832 Chronic kidney disease, stage 3b: Secondary | ICD-10-CM | POA: Diagnosis not present

## 2023-12-04 DIAGNOSIS — N183 Chronic kidney disease, stage 3 unspecified: Secondary | ICD-10-CM | POA: Diagnosis not present

## 2023-12-04 DIAGNOSIS — R059 Cough, unspecified: Secondary | ICD-10-CM | POA: Diagnosis not present

## 2023-12-04 DIAGNOSIS — Z9359 Other cystostomy status: Secondary | ICD-10-CM | POA: Diagnosis not present

## 2023-12-04 DIAGNOSIS — I10 Essential (primary) hypertension: Secondary | ICD-10-CM | POA: Diagnosis not present

## 2023-12-04 DIAGNOSIS — I5022 Chronic systolic (congestive) heart failure: Secondary | ICD-10-CM | POA: Diagnosis not present

## 2023-12-04 DIAGNOSIS — I9589 Other hypotension: Secondary | ICD-10-CM | POA: Diagnosis not present

## 2023-12-04 DIAGNOSIS — D631 Anemia in chronic kidney disease: Secondary | ICD-10-CM | POA: Diagnosis not present

## 2023-12-04 DIAGNOSIS — N179 Acute kidney failure, unspecified: Secondary | ICD-10-CM | POA: Diagnosis not present

## 2023-12-12 NOTE — Progress Notes (Deleted)
 Suprapubic Cath Change  Patient is present today for a suprapubic catheter change due to urinary retention.  9 ml of water  was drained from the balloon, a 16 FR foley cath was removed from the tract with out difficulty.  Site was cleaned and prepped in a sterile fashion with betadine.  A 16 FR foley cath was replaced into the tract no complications were noted. Urine return was noted, 10 ml of sterile water  was inflated into the balloon and a night bag was attached for drainage.  Patient tolerated well.     Performed by: CLOTILDA CORNWALL, PA-C and ***  Follow up: No follow-ups on file.

## 2023-12-14 NOTE — Progress Notes (Unsigned)
 Suprapubic Cath Change  Patient is present today for a suprapubic catheter change due to urinary retention.  9 ml of water  was drained from the balloon, a 16 FR foley cath was removed from the tract with out difficulty.  Site was cleaned and prepped in a sterile fashion with betadine.  A 16 FR foley cath was replaced into the tract no complications were noted. Urine return was noted, 10 ml of sterile water  was inflated into the balloon and a night bag was attached for drainage.  Patient tolerated well.     Performed by: CLOTILDA CORNWALL, PA-C and ***  Follow up: No follow-ups on file.

## 2023-12-15 ENCOUNTER — Ambulatory Visit (INDEPENDENT_AMBULATORY_CARE_PROVIDER_SITE_OTHER): Admitting: Urology

## 2023-12-15 ENCOUNTER — Ambulatory Visit: Admitting: Urology

## 2023-12-15 DIAGNOSIS — R339 Retention of urine, unspecified: Secondary | ICD-10-CM | POA: Diagnosis not present

## 2023-12-15 DIAGNOSIS — E43 Unspecified severe protein-calorie malnutrition: Secondary | ICD-10-CM | POA: Diagnosis not present

## 2023-12-15 DIAGNOSIS — Z9359 Other cystostomy status: Secondary | ICD-10-CM | POA: Diagnosis not present

## 2023-12-15 DIAGNOSIS — Z6820 Body mass index (BMI) 20.0-20.9, adult: Secondary | ICD-10-CM | POA: Diagnosis not present

## 2023-12-15 DIAGNOSIS — S51812A Laceration without foreign body of left forearm, initial encounter: Secondary | ICD-10-CM | POA: Diagnosis not present

## 2023-12-15 DIAGNOSIS — R600 Localized edema: Secondary | ICD-10-CM | POA: Diagnosis not present

## 2023-12-15 DIAGNOSIS — S51811A Laceration without foreign body of right forearm, initial encounter: Secondary | ICD-10-CM | POA: Diagnosis not present

## 2023-12-15 DIAGNOSIS — I5022 Chronic systolic (congestive) heart failure: Secondary | ICD-10-CM | POA: Diagnosis not present

## 2023-12-15 DIAGNOSIS — T148XXA Other injury of unspecified body region, initial encounter: Secondary | ICD-10-CM | POA: Diagnosis not present

## 2023-12-16 ENCOUNTER — Ambulatory Visit (INDEPENDENT_AMBULATORY_CARE_PROVIDER_SITE_OTHER): Admitting: Pulmonary Disease

## 2023-12-16 ENCOUNTER — Encounter: Payer: Self-pay | Admitting: Pulmonary Disease

## 2023-12-16 VITALS — BP 106/70 | HR 74 | Temp 97.6°F | Ht 74.0 in | Wt 164.0 lb

## 2023-12-16 DIAGNOSIS — J181 Lobar pneumonia, unspecified organism: Secondary | ICD-10-CM

## 2023-12-16 NOTE — Progress Notes (Signed)
 Synopsis: Referred in by Bertrum Charlie CROME, MD   Subjective:   PATIENT ID: Carlin ONEIDA Hsu GENDER: male DOB: [birth year], MRN: [id number]  Chief Complaint  Patient presents with   Consult    Recent pna, now in rehab.    HPI Mr. Brannigan is an 83 year old male patient with a past medical history of hypertension, urinary retention status post suprapubic cath on 08/26/2023 who presented to [facility] initially on 04/30 with lethargy, encephalopathy and signs of sepsis. He was found to have MRSA bacteremia and MRSA pneumonia with a right upper lobe necrotizing pneumonia. Completed a prolonged course of linezolid  and was discharged from the hospital on 11/2023. Also course complicated by right parapnemonic effusion s/p thoracentesis on 10/08/2023 with fluid analysis consistent with an exudate but no signs of empyema.   Today reports, improving respiratory symptoms. He is active with physical therapy with slow improvement every day. He denies any fevers, chills, chest pain or shortness of breath.    ROS All systems were reviewed and are negative except for the above.  Objective:  There were no vitals filed for this visit. BMI Readings from Last 3 Encounters:  11/10/23 19.23 kg/m  11/07/23 20.03 kg/m  11/05/23 20.04 kg/m   Wt Readings from Last 3 Encounters:  11/10/23 149 lb 12.8 oz (67.9 kg)  11/07/23 156 lb (70.8 kg)  11/05/23 156 lb 1.4 oz (70.8 kg)    Physical Exam GEN no acute distress, frail HEENT supple neck, reactive pupils Lungs Clear bilateral air entry CVS normal S1, normal S2, regular rate and rhythm Abdomen soft nontender nondistended positive bowel sound Extremities +2 LE edema.  Ancillary Information   CBC    Component Value Date/Time   WBC 7.6 11/07/2023 1229   WBC 3.4 (L) 11/05/2023 0352   RBC 2.77 (L) 11/07/2023 1229   HGB 8.8 (L) 11/07/2023 1229   HGB 13.6 02/05/2022 1500   HCT 26.5 (L) 11/07/2023 1229   HCT 38.4 02/05/2022 1500   PLT 135 (L)  11/07/2023 1229   PLT 271 02/05/2022 1500   MCV 95.7 11/07/2023 1229   MCV 94 02/05/2022 1500   MCH 31.8 11/07/2023 1229   MCHC 33.2 11/07/2023 1229   RDW 14.4 11/07/2023 1229   RDW 11.7 02/05/2022 1500   LYMPHSABS 1.0 11/07/2023 1229   LYMPHSABS 0.9 02/05/2022 1500   MONOABS 0.9 11/07/2023 1229   EOSABS 0.1 11/07/2023 1229   EOSABS 0.1 02/05/2022 1500   BASOSABS 0.1 11/07/2023 1229   BASOSABS 0.1 02/05/2022 1500        No data to display           Assessment & Plan:  Mr. Somers is an 83 year old male patient with a past medical history of hypertension, urinary retention status post suprapubic cath on 08/26/2023 who presented to [facility] initially on 04/30 with lethargy, encephalopathy and signs of sepsis. He was found to have MRSA bacteremia and MRSA pneumonia with a right upper lobe necrotizing pneumonia. Completed a prolonged course of linezolid  and was discharged from the hospital on 11/2023.  #MRSA Necrotizing pneumonia c/b MRSA bacteremia s/p prolonged Linezolid  course.   []  CT chest wo contrast to confirm resolution and evaluate for endobronchial lesions.   #LE Edema  Appears euvolemic otherwise. Could be lymphedema and recommended him to elevate his legs while sitting and wear com-pression stockings daily. Should that not help we will need to start him on lasix .   RTC 3 months.   I spent 60 minutes caring for this patient  today, including preparing to see the patient, obtaining a medical history , reviewing a separately obtained history, performing a medically appropriate examination and/or evaluation, counseling and educating the patient/family/caregiver, documenting clinical information in the electronic health record, and independently interpreting results (not separately reported/billed) and communicating results to the patient/family/caregiver  Darrin Barn, MD [facility] 12/16/2023 3:22 PM

## 2023-12-19 LAB — SLOW GROWER BROTH SUSCEP.
Ciprofloxacin: >8
Clarithromycin: 1
Doxycycline: >8
Linezolid: 32
Minocycline: >8
Moxifloxacin: >4
Rifampin: 2
Streptomycin: >32

## 2023-12-19 LAB — ACID FAST ID BY PCR AND SUSCEPTIBILITIES
M Tuberculosis Complex: NEGATIVE
M avium complex: NEGATIVE

## 2023-12-19 LAB — ACID FAST CULTURE WITH REFLEXED SENSITIVITIES (MYCOBACTERIA): Acid Fast Culture: POSITIVE — AB

## 2023-12-19 LAB — MYCOBACTERIA ID BY MALDI

## 2023-12-19 LAB — ORGANISM ID BY MALDI

## 2023-12-25 DIAGNOSIS — T8522XA Displacement of intraocular lens, initial encounter: Secondary | ICD-10-CM | POA: Diagnosis not present

## 2023-12-25 DIAGNOSIS — E119 Type 2 diabetes mellitus without complications: Secondary | ICD-10-CM | POA: Diagnosis not present

## 2023-12-25 DIAGNOSIS — H401122 Primary open-angle glaucoma, left eye, moderate stage: Secondary | ICD-10-CM | POA: Diagnosis not present

## 2023-12-25 DIAGNOSIS — H04121 Dry eye syndrome of right lacrimal gland: Secondary | ICD-10-CM | POA: Diagnosis not present

## 2023-12-29 ENCOUNTER — Inpatient Hospital Stay (HOSPITAL_BASED_OUTPATIENT_CLINIC_OR_DEPARTMENT_OTHER): Admitting: Oncology

## 2023-12-29 ENCOUNTER — Encounter: Payer: Self-pay | Admitting: Oncology

## 2023-12-29 ENCOUNTER — Inpatient Hospital Stay: Attending: Oncology

## 2023-12-29 VITALS — BP 89/66 | HR 52 | Temp 96.1°F | Resp 18 | Wt 164.0 lb

## 2023-12-29 DIAGNOSIS — D649 Anemia, unspecified: Secondary | ICD-10-CM

## 2023-12-29 DIAGNOSIS — I959 Hypotension, unspecified: Secondary | ICD-10-CM

## 2023-12-29 DIAGNOSIS — Z86711 Personal history of pulmonary embolism: Secondary | ICD-10-CM | POA: Insufficient documentation

## 2023-12-29 DIAGNOSIS — D631 Anemia in chronic kidney disease: Secondary | ICD-10-CM | POA: Insufficient documentation

## 2023-12-29 DIAGNOSIS — D696 Thrombocytopenia, unspecified: Secondary | ICD-10-CM | POA: Diagnosis not present

## 2023-12-29 DIAGNOSIS — E039 Hypothyroidism, unspecified: Secondary | ICD-10-CM | POA: Diagnosis not present

## 2023-12-29 DIAGNOSIS — I2699 Other pulmonary embolism without acute cor pulmonale: Secondary | ICD-10-CM | POA: Diagnosis not present

## 2023-12-29 DIAGNOSIS — N189 Chronic kidney disease, unspecified: Secondary | ICD-10-CM | POA: Diagnosis not present

## 2023-12-29 DIAGNOSIS — Z8042 Family history of malignant neoplasm of prostate: Secondary | ICD-10-CM | POA: Insufficient documentation

## 2023-12-29 DIAGNOSIS — Z7901 Long term (current) use of anticoagulants: Secondary | ICD-10-CM | POA: Diagnosis not present

## 2023-12-29 LAB — CBC WITH DIFFERENTIAL (CANCER CENTER ONLY)
Abs Immature Granulocytes: 0.39 K/uL — ABNORMAL HIGH (ref 0.00–0.07)
Basophils Absolute: 0.1 K/uL (ref 0.0–0.1)
Basophils Relative: 1 %
Eosinophils Absolute: 0.2 K/uL (ref 0.0–0.5)
Eosinophils Relative: 2 %
HCT: 29.8 % — ABNORMAL LOW (ref 39.0–52.0)
Hemoglobin: 9.5 g/dL — ABNORMAL LOW (ref 13.0–17.0)
Immature Granulocytes: 5 %
Lymphocytes Relative: 10 %
Lymphs Abs: 0.9 K/uL (ref 0.7–4.0)
MCH: 32.5 pg (ref 26.0–34.0)
MCHC: 31.9 g/dL (ref 30.0–36.0)
MCV: 102.1 fL — ABNORMAL HIGH (ref 80.0–100.0)
Monocytes Absolute: 0.5 K/uL (ref 0.1–1.0)
Monocytes Relative: 6 %
Neutro Abs: 6.3 K/uL (ref 1.7–7.7)
Neutrophils Relative %: 76 %
Platelet Count: 316 K/uL (ref 150–400)
RBC: 2.92 MIL/uL — ABNORMAL LOW (ref 4.22–5.81)
RDW: 16.5 % — ABNORMAL HIGH (ref 11.5–15.5)
WBC Count: 8.4 K/uL (ref 4.0–10.5)
nRBC: 0 % (ref 0.0–0.2)

## 2023-12-29 MED ORDER — APIXABAN 2.5 MG PO TABS: 2.5000 mg | ORAL_TABLET | Freq: Two times a day (BID) | ORAL | 1 refills | Status: DC

## 2023-12-29 NOTE — Assessment & Plan Note (Signed)
 Currently off anticoagulation due to progressive anemia and thrombocytopenia.  Platelet count has normalized.  Anemia has improved. Recommend patient to resume on Eliquis  2.5 mg twice daily.

## 2023-12-29 NOTE — Assessment & Plan Note (Signed)
 Likely due to marrow suppression due to hypothyroidism and recent Zyvox  use. Completely resolved

## 2023-12-29 NOTE — Assessment & Plan Note (Addendum)
 anemia secondary to chronic kidney disease, marrow suppression due to recent infection/antibiotic treatments..   Patient has adequate B12, folate level.  Negative M protein now protein electrophoresis. Lab Results  Component Value Date   HGB 9.5 (L) 12/29/2023   TIBC 249 (L) 10/30/2023   IRONPCTSAT 94 (H) 10/30/2023   FERRITIN 475 (H) 10/30/2023    Ferritin is above 200.  At goal. He has a history of thrombosis and currently off anticoagulation.  Hemoglobin has improved to 9.5.  I will hold off erythropoietin replacement and close monitor.SABRA

## 2023-12-29 NOTE — Progress Notes (Signed)
 Hematology/Oncology Progress note [phone] [phone]           REFERRING PROVIDER: Bertrum Charlie CROME, MD   CHIEF COMPLAINTS/REASON FOR VISIT:   Anemia, pulmonary embolism   ASSESSMENT & PLAN:   Normocytic anemia anemia secondary to chronic kidney disease, marrow suppression due to recent infection/antibiotic treatments..   Patient has adequate B12, folate level.  Negative M protein now protein electrophoresis. Lab Results  Component Value Date   HGB 9.5 (L) 12/29/2023   TIBC 249 (L) 10/30/2023   IRONPCTSAT 94 (H) 10/30/2023   FERRITIN 475 (H) 10/30/2023    Ferritin is above 200.  At goal. He has a history of thrombosis and currently off anticoagulation.  Hemoglobin has improved to 9.5.  I will hold off erythropoietin replacement and close monitor..  Hypotension Chronic issue for him. Asymptomatic.  He is on Midodrine .   Pulmonary embolism (HCC) Currently off anticoagulation due to progressive anemia and thrombocytopenia.  Platelet count has normalized.  Anemia has improved. Recommend patient to resume on Eliquis  2.5 mg twice daily.  Thrombocytopenia (HCC) Likely due to marrow suppression due to hypothyroidism and recent Zyvox  use. Completely resolved    Orders Placed This Encounter  Procedures   CBC with Differential (Cancer Center Only)    Standing Status:   Future    Expected Date:   02/09/2024    Expiration Date:   05/09/2024   Iron  and TIBC    Standing Status:   Future    Expected Date:   02/09/2024    Expiration Date:   05/09/2024   Ferritin    Standing Status:   Future    Expected Date:   02/09/2024    Expiration Date:   05/09/2024   Retic Panel    Standing Status:   Future    Expected Date:   02/09/2024    Expiration Date:   05/09/2024   Follow-up in 6 weeks to review results. All questions were answered. The patient knows to call the clinic with any problems, questions or concerns.  Zelphia Cap, MD, PhD [facility] Hematology  Oncology 12/29/2023   HISTORY OF PRESENTING ILLNESS:   Zachary [name] is a  83 y.o.  male with PMH listed below was seen in consultation at the request of  Bertrum Charlie CROME, MD  for anemia, thrombocytopenia, weight loss.   Patient reports feeling tired and fatigued. He has lost a weight since his recent hospitalization in March 2025.  + Early satiety  he was accompanied by his brother-in-law who is also his POA Patient was admitted 08/05/2023 - 08/08/2022 secondary to urinary retention with severe bladder distention, hydronephrosis, evidence of left calyceal rupture, left hematuria.  Patient has suprapubic catheter placed.  Patient distally manages her catheter well and drains 4 times a day.  He has an appointment with urology next month.  PSA was normal.  Mild prostamegaly  08/08/2023 CT abdomen pelvis without contrast showed new nodule 9 x 8 mm right middle lobe new from 2023.  Patient developed AKI secondary to bladder outlet obstruction. He has a history of chronic kidney disease.  Most recent creatinine was 1.64, close to his baseline  10/30/2023 - 11/05/2023 hospitalization recently due to recent MRSA bacteremia secondary to cavitary pneumonia, PE on Eliquis  which was hold due to progressive anemia, thrombocytopenia, hypothyroidism on synthroid .   INTERVAL HISTORY Zachary [name] is a 83 y.o. male who has above history reviewed by me today presents for follow up visit for  anemia, thrombocytopenia  Appetite has improved.  He has gained weight. Patient denies any bleeding events, abdominal pain, nausea or vomiting. He follows up with urology for supra pubic catheter management. Patient will follow-up with pulmonology and a CT chest without contrast was scheduled to ensure complete resolution of cavitary pneumonia.   MEDICAL HISTORY:  Past Medical History:  Diagnosis Date   B12 deficiency anemia 08/03/2019   Hypercholesterolemia    Hypertension    Vertigo    last episode approx  09/2018    SURGICAL HISTORY: Past Surgical History:  Procedure Laterality Date   APPENDECTOMY     CATARACT EXTRACTION Bilateral    COLONOSCOPY N/A 11/02/2023   Procedure: COLONOSCOPY;  Surgeon: Onita Elspeth Sharper, DO;  Location[facility] ENDOSCOPY;  Service: Gastroenterology;  Laterality: N/A;   COLONOSCOPY WITH PROPOFOL  N/A 12/24/2018   Procedure: COLONOSCOPY WITH PROPOFOL ;  Surgeon: Jinny Carmine, MD;  Location: [facility] ENDOSCOPY;  Service: Endoscopy;  Laterality: N/A;   COLONOSCOPY WITH PROPOFOL  N/A 12/13/2019   Procedure: COLONOSCOPY WITH BIOPSY ;  Surgeon: Jinny Carmine, MD;  Location: [facility] SURGERY CNTR;  Service: Endoscopy;  Laterality: N/A;  priority 3   ESOPHAGOGASTRODUODENOSCOPY N/A 11/02/2023   Procedure: EGD (ESOPHAGOGASTRODUODENOSCOPY);  Surgeon: Onita Elspeth Sharper, DO;  Location: [facility] ENDOSCOPY;  Service: Gastroenterology;  Laterality: N/A;   IR CYSTOSTOMY TUBE PLACEMENT/BLADDER ASPIRATION  08/26/2023   PLEURAL SCARIFICATION  05/2015   POLYPECTOMY N/A 12/13/2019   Procedure: POLYPECTOMY;  Surgeon: Jinny Carmine, MD;  Location: [facility] SURGERY CNTR;  Service: Endoscopy;  Laterality: N/A;   TEE WITHOUT CARDIOVERSION N/A 10/07/2023   Procedure: ECHOCARDIOGRAM, TRANSESOPHAGEAL;  Surgeon: Alluri, Keller BROCKS, MD;  Location: [facility] ORS;  Service: Cardiovascular;  Laterality: N/A;    SOCIAL HISTORY: Social History   Socioeconomic History   Marital status: Married    Spouse name: Not on file   Number of children: 0   Years of education: Not on file   Highest education level: 12th grade  Occupational History   Occupation: retired  Tobacco Use   Smoking status: Never   Smokeless tobacco: Never  Vaping Use   Vaping status: Never Used  Substance and Sexual Activity   Alcohol  use: No    Alcohol /week: 0.0 standard drinks of alcohol    Drug use: No   Sexual activity: Not on file  Other Topics Concern   Not on file  Social History Narrative   Lives with Jones, wife.    Social Drivers of  Corporate investment banker Strain: Low Risk  (09/25/2021)   Overall Financial Resource Strain (CARDIA)    Difficulty of Paying Living Expenses: Not hard at all  Food Insecurity: No Food Insecurity (10/31/2023)   Hunger Vital Sign    Worried About Running Out of Food in the Last Year: Never true    Ran Out of Food in the Last Year: Never true  Transportation Needs: No Transportation Needs (10/31/2023)   PRAPARE - Administrator, Civil Service (Medical): No    Lack of Transportation (Non-Medical): No  Physical Activity: Insufficiently Active (09/25/2021)   Exercise Vital Sign    Days of Exercise per Week: 2 days    Minutes of Exercise per Session: 60 min  Stress: No Stress Concern Present (11/24/2019)   [name]    Feeling of Stress : Not at all  Social Connections: Moderately Isolated (10/31/2023)   Social Connection and Isolation Panel    Frequency of Communication with Friends and Family: Twice a week  Frequency of Social Gatherings with Friends and Family: More than three times a week    Attends Religious Services: Never    Database administrator or Organizations: No    Attends Banker Meetings: Never    Marital Status: Married  Catering manager Violence: Not At Risk (10/31/2023)   Humiliation, Afraid, Rape, and Kick questionnaire    Fear of Current or Ex-Partner: No    Emotionally Abused: No    Physically Abused: No    Sexually Abused: No    FAMILY HISTORY: Family History  Problem Relation Age of Onset   Hypertension Mother    Hyperlipidemia Mother    Heart attack Father    Hypertension Father    CVA Father    ALS Brother    Prostate cancer Brother     ALLERGIES:  has no known allergies.  MEDICATIONS:  Current Outpatient Medications  Medication Sig Dispense Refill   acetaminophen  (TYLENOL ) 325 MG tablet Take 2 tablets (650 mg total) by mouth every 6 (six) hours as needed for  mild pain (pain score 1-3), headache or fever (or Fever >/= 101).     bisacodyl  (DULCOLAX) 10 MG suppository Place 10 mg rectally as needed for moderate constipation.     dapagliflozin propanediol (FARXIGA) 10 MG TABS tablet Take 10 mg by mouth daily.     dextromethorphan -guaiFENesin  (MUCINEX  DM) 30-600 MG 12hr tablet Take 1 tablet by mouth 2 (two) times daily as needed for cough.     feeding supplement (ENSURE ENLIVE / ENSURE PLUS) LIQD Take 237 mLs by mouth 3 (three) times daily between meals.     iron  polysaccharides (NIFEREX) 150 MG capsule Take 1 capsule (150 mg total) by mouth daily. Can be any iron  supplement.     levothyroxine  (SYNTHROID ) 50 MCG tablet Take 1 tablet (50 mcg total) by mouth daily at 6 (six) AM.     midodrine  (PROAMATINE ) 10 MG tablet Take 1 tablet (10 mg total) by mouth 3 (three) times daily with meals.     Multiple Vitamin (MULTIVITAMIN WITH MINERALS) TABS tablet Take 1 tablet by mouth daily.     pantoprazole  (PROTONIX ) 40 MG tablet Take 1 tablet (40 mg total) by mouth 2 (two) times daily.     polyethylene glycol (MIRALAX  / GLYCOLAX ) 17 g packet Take 17 g by mouth daily.     sennosides-docusate sodium (SENOKOT-S) 8.6-50 MG tablet Take 1 tablet by mouth daily.     apixaban  (ELIQUIS ) 2.5 MG TABS tablet Take 1 tablet (2.5 mg total) by mouth 2 (two) times daily. 60 tablet 1   atorvastatin  (LIPITOR) 10 MG tablet Take 10 mg by mouth daily. (Patient not taking: Reported on 12/29/2023)     No current facility-administered medications for this visit.    Review of Systems  Constitutional:  Positive for fatigue. Negative for appetite change, chills, fever and unexpected weight change.  HENT:   Negative for hearing loss and voice change.   Eyes:  Negative for eye problems and icterus.  Respiratory:  Negative for chest tightness, cough and shortness of breath.   Cardiovascular:  Negative for chest pain and leg swelling.  Gastrointestinal:  Negative for abdominal distention and  abdominal pain.  Endocrine: Negative for hot flashes.  Genitourinary:  Negative for difficulty urinating, dysuria and frequency.   Musculoskeletal:  Negative for arthralgias.  Skin:  Negative for itching and rash.  Neurological:  Negative for light-headedness and numbness.  Hematological:  Negative for adenopathy. Does not bruise/bleed easily.  Psychiatric/Behavioral:  Negative for confusion.    PHYSICAL EXAMINATION: ECOG PERFORMANCE STATUS: 2 - Symptomatic, <50% confined to bed Vitals:   12/29/23 1358  BP: (!) 89/66  Pulse: (!) 52  Resp: 18  Temp: (!) 96.1 F (35.6 C)  SpO2: 100%   Filed Weights   12/29/23 1358  Weight: 164 lb (74.4 kg)    Physical Exam Constitutional:      General: He is not in acute distress.    Comments: Frail appearance  HENT:     Head: Normocephalic and atraumatic.  Eyes:     General: No scleral icterus. Cardiovascular:     Rate and Rhythm: Normal rate.  Pulmonary:     Effort: Pulmonary effort is normal. No respiratory distress.     Breath sounds: No wheezing.  Abdominal:     General: Bowel sounds are normal. There is no distension.     Palpations: Abdomen is soft.  Musculoskeletal:        General: Normal range of motion.     Cervical back: Normal range of motion and neck supple.  Skin:    Findings: No erythema or rash.  Neurological:     Mental Status: He is alert and oriented to person, place, and time. Mental status is at baseline.  Psychiatric:        Mood and Affect: Mood normal.     LABORATORY DATA:  I have reviewed the data as listed    Latest Ref Rng & Units 12/29/2023    1:34 PM 11/07/2023   12:29 PM 11/05/2023    3:52 AM  CBC  WBC 4.0 - 10.5 K/uL 8.4  7.6  3.4   Hemoglobin 13.0 - 17.0 g/dL 9.5  8.8  7.2   Hematocrit 39.0 - 52.0 % 29.8  26.5  21.1   Platelets 150 - 400 K/uL 316  135  63       Latest Ref Rng & Units 11/05/2023    3:52 AM 11/04/2023    4:52 AM 11/03/2023    5:16 AM  CMP  Glucose 70 - 99 mg/dL 79  80  81   BUN  8 - 23 mg/dL 32  32  36   Creatinine 0.61 - 1.24 mg/dL 8.27  8.24  8.15   Sodium 135 - 145 mmol/L 133  134  133   Potassium 3.5 - 5.1 mmol/L 4.3  4.6  4.9   Chloride 98 - 111 mmol/L 104  103  104   CO2 22 - 32 mmol/L 23  25  24    Calcium  8.9 - 10.3 mg/dL 7.9  8.0  8.2   Total Protein 6.5 - 8.1 g/dL 4.3  4.5  4.7   Total Bilirubin 0.0 - 1.2 mg/dL 0.4  0.6  0.9   Alkaline Phos 38 - 126 U/L 73  73  78   AST 15 - 41 U/L 22  21  23    ALT 0 - 44 U/L 23  22  28        RADIOGRAPHIC STUDIES: I have personally reviewed the radiological images as listed and agreed with the findings in the report. DG Chest Port 1 View Result Date: 10/10/2023 CLINICAL DATA:  Pleural effusion. EXAM: PORTABLE CHEST 1 VIEW COMPARISON:  Oct 08, 2023. FINDINGS: Stable cardiomediastinal silhouette. Stable cavitating lesion seen in right lung. Small pleural effusions are noted. Stable hiatal hernia. IMPRESSION: Stable large cavitating lesions seen in right lung base. Small pleural effusions. Stable hiatal hernia. Electronically Signed   By: Lynwood Seip  Jr M.D.   On: 10/10/2023 13:18   ECHOCARDIOGRAM COMPLETE Result Date: 10/09/2023    ECHOCARDIOGRAM REPORT   Patient Name:   IDRIS EDMUNDSON Date of Exam: 10/08/2023 Medical Rec #:  [id number]           Height:       74.0 in Accession #:    [id number]          Weight:       125.0 lb Date of Birth:  1940-06-10           BSA:          1.780 m Patient Age:    82 years            BP:           105/63 mmHg Patient Gender: M                   HR:           100 bpm. Exam Location:  [facility] Procedure: 2D Echo, Cardiac Doppler and Color Doppler (Both Spectral and Color            Flow Doppler were utilized during procedure). Indications:     Pulm embolis  History:         Patient has no prior history of Echocardiogram examinations.                  CAD; Risk Factors:Hypertension.  Sonographer:     Carmelita Glendale NESTLE, FE, PE Referring Phys:  [id number] CARALYN HUDSON Diagnosing Phys: Keller Alluri  IMPRESSIONS  1. Left ventricular ejection fraction, by estimation, is 35 to 40%. The left ventricle has moderately decreased function. The left ventricle demonstrates global hypokinesis. There is mild left ventricular hypertrophy. Left ventricular diastolic parameters are indeterminate.  2. Right ventricular systolic function is normal. The right ventricular size is normal.  3. The mitral valve is normal in structure. Mild mitral valve regurgitation.  4. Low flow low gradient, mild to moderate aortic stenosis. The aortic valve is tricuspid. Aortic valve regurgitation is mild to moderate.  5. There is mild dilatation of the aortic root, measuring 42 mm. There is mild dilatation of the ascending aorta, measuring 41 mm. FINDINGS  Left Ventricle: Left ventricular ejection fraction, by estimation, is 35 to 40%. The left ventricle has moderately decreased function. The left ventricle demonstrates global hypokinesis. The left ventricular internal cavity size was normal in size. There is mild left ventricular hypertrophy. Left ventricular diastolic parameters are indeterminate. Right Ventricle: The right ventricular size is normal. No increase in right ventricular wall thickness. Right ventricular systolic function is normal. Left Atrium: Left atrial size was normal in size. Right Atrium: Right atrial size was normal in size. Pericardium: Trivial pericardial effusion is present. The pericardial effusion is anterior to the right ventricle. Mitral Valve: The mitral valve is normal in structure. Mild mitral valve regurgitation. Tricuspid Valve: The tricuspid valve is normal in structure. Tricuspid valve regurgitation is mild. Aortic Valve: Low flow low gradient, mild to moderate aortic stenosis. The aortic valve is tricuspid. Aortic valve regurgitation is mild to moderate. Aortic regurgitation PHT measures 490 msec. Aortic valve mean gradient measures 12.0 mmHg. Aortic valve peak gradient measures 22.1 mmHg. Aortic valve area,  by VTI measures 1.20 cm. Pulmonic Valve: The pulmonic valve was not well visualized. Pulmonic valve regurgitation is mild. Aorta: The ascending aorta was not well visualized. There is mild dilatation of the aortic root, measuring 42 mm. There  is mild dilatation of the ascending aorta, measuring 41 mm. IAS/Shunts: The atrial septum is grossly normal.  LEFT VENTRICLE PLAX 2D LVIDd:         4.60 cm LVIDs:         4.10 cm LV PW:         1.20 cm LV IVS:        1.20 cm LVOT diam:     2.30 cm LV SV:         53 LV SV Index:   30 LVOT Area:     4.15 cm  RIGHT VENTRICLE RV Basal diam:  3.50 cm RV Mid diam:    3.60 cm RV S prime:     15.30 cm/s TAPSE (M-mode): 1.8 cm LEFT ATRIUM           Index        RIGHT ATRIUM           Index LA Vol (A4C): 42.1 ml 23.65 ml/m  RA Area:     18.70 cm                                    RA Volume:   52.20 ml  29.33 ml/m  AORTIC VALVE AV Area (Vmax):    1.37 cm AV Area (Vmean):   1.36 cm AV Area (VTI):     1.20 cm AV Vmax:           235.00 cm/s AV Vmean:          161.000 cm/s AV VTI:            0.444 m AV Peak Grad:      22.1 mmHg AV Mean Grad:      12.0 mmHg LVOT Vmax:         77.40 cm/s LVOT Vmean:        52.800 cm/s LVOT VTI:          0.128 m LVOT/AV VTI ratio: 0.29 AI PHT:            490 msec  AORTA Ao Root diam: 4.20 cm Ao Asc diam:  4.10 cm TRICUSPID VALVE TR Peak grad:   25.0 mmHg TR Vmax:        250.00 cm/s  SHUNTS Systemic VTI:  0.13 m Systemic Diam: 2.30 cm Keller Paterson Electronically signed by Keller Paterson Signature Date/Time: 10/09/2023/9:00:43 AM    Final    DG Chest Port 1 View Result Date: 10/08/2023 CLINICAL DATA:  Status post right thoracentesis. EXAM: PORTABLE CHEST 1 VIEW COMPARISON:  Oct 07, 2023. FINDINGS: No definite pneumothorax is noted status post thoracentesis. No significant residual pleural effusion is noted. As described on prior studies. IMPRESSION: No definite pneumothorax status post thoracentesis. Electronically Signed   By: Lynwood Landy Raddle M.D.   On:  10/08/2023 13:18   US  THORACENTESIS ASP PLEURAL SPACE W/IMG GUIDE Result Date: 10/08/2023 INDICATION: 83 year old male with MRSA pneumonia, lung empyema, bilateral pleural effusions. IR was requested for diagnostic and therapeutic right-sided thoracentesis. EXAM: ULTRASOUND GUIDED DIAGNOSTIC AND THERAPEUTIC THORACENTESIS MEDICATIONS: 4 cc of 1% lidocaine  COMPLICATIONS: None immediate. PROCEDURE: An ultrasound guided thoracentesis was thoroughly discussed with the patient and questions answered. The benefits, risks, alternatives and complications were also discussed. The patient understands and wishes to proceed with the procedure. Written consent was obtained. Ultrasound was performed to localize and mark an adequate pocket of fluid in the right chest. The  area was then prepped and draped in the normal sterile fashion. 1% Lidocaine  was used for local anesthesia. Under ultrasound guidance a 6 Fr Safe-T-Centesis catheter was introduced. Thoracentesis was performed. The catheter was removed and a dressing applied. FINDINGS: A total of approximately 300 cc of clear, straw-colored fluid fluid was removed. Samples were sent to the laboratory as requested by the clinical team. IMPRESSION: Successful ultrasound guided right thoracentesis yielding 300 cc of pleural fluid. Procedure performed by Carlin Griffon, PA-C Electronically Signed   By: Wilkie Lent M.D.   On: 10/08/2023 12:27   ECHO TEE Result Date: 10/07/2023    TRANSESOPHOGEAL ECHO REPORT   Patient Name:   JANIEL CRISOSTOMO Date of Exam: 10/07/2023 Medical Rec #:  [id number]           Height:       74.0 in Accession #:    [id number]          Weight:       125.0 lb Date of Birth:  12-Jul-1940           BSA:          1.780 m Patient Age:    82 years            BP:           95/62 mmHg Patient Gender: M                   HR:           51 bpm. Exam Location:  [facility] Procedure: Transesophageal Echo, Cardiac Doppler and Color Doppler (Both            Spectral and  Color Flow Doppler were utilized during procedure). Indications:     Not listed on TEE check-in sheet  History:         Patient has no prior history of Echocardiogram examinations.                  Risk Factors:Hypertension.  Sonographer:     Christopher Furnace Referring Phys:  [id number] CARALYN HUDSON Diagnosing Phys: Keller Paterson PROCEDURE: After discussion of the risks and benefits of a TEE, an informed consent was obtained from the patient. The transesophogeal probe was passed without difficulty through the esophogus of the patient. Local oropharyngeal anesthetic was provided with Cetacaine . Sedation performed by different physician. The patient was monitored while under deep sedation. Image quality was good. The patient's vital signs; including heart rate, blood pressure, and oxygen  saturation; remained stable throughout the  procedure. The patient developed no complications during the procedure.  IMPRESSIONS  1. Left ventricular ejection fraction, by estimation, is 35 to 40%. The left ventricle has moderately decreased function.  2. Right ventricular systolic function is mildly reduced. The right ventricular size is normal.  3. Left atrial size was severely dilated. No left atrial/left atrial appendage thrombus was detected.  4. The mitral valve is normal in structure. Mild mitral valve regurgitation.  5. Low flow low gradient aortic stenosis, moderate. The aortic valve is tricuspid. Aortic valve regurgitation is mild to moderate. Conclusion(s)/Recommendation(s): No evidence of vegetation/infective endocarditis on this transesophageael echocardiogram. FINDINGS  Left Ventricle: Left ventricular ejection fraction, by estimation, is 35 to 40%. The left ventricle has moderately decreased function. The left ventricular internal cavity size was normal in size. Right Ventricle: The right ventricular size is normal. No increase in right ventricular wall thickness. Right ventricular systolic function is mildly reduced. Left  Atrium: Left atrial size  was severely dilated. No left atrial/left atrial appendage thrombus was detected. Right Atrium: Right atrial size was normal in size. Pericardium: There is no evidence of pericardial effusion. Mitral Valve: The mitral valve is normal in structure. Mild mitral valve regurgitation. Tricuspid Valve: The tricuspid valve is normal in structure. Tricuspid valve regurgitation is mild. Aortic Valve: Low flow low gradient aortic stenosis, moderate. The aortic valve is tricuspid. Aortic valve regurgitation is mild to moderate. Aortic valve mean gradient measures 11.5 mmHg. Aortic valve peak gradient measures 20.9 mmHg. Aortic valve area,  by VTI measures 1.40 cm. Pulmonic Valve: The pulmonic valve was normal in structure. Pulmonic valve regurgitation is trivial. Aorta: The aortic root is normal in size and structure. IAS/Shunts: No atrial level shunt detected by color flow Doppler.  LEFT VENTRICLE PLAX 2D LVOT diam:     2.60 cm LV SV:         56 LV SV Index:   32 LVOT Area:     5.31 cm  AORTIC VALVE AV Area (Vmax):    1.46 cm AV Area (Vmean):   1.34 cm AV Area (VTI):     1.40 cm AV Vmax:           228.50 cm/s AV Vmean:          155.000 cm/s AV VTI:            0.402 m AV Peak Grad:      20.9 mmHg AV Mean Grad:      11.5 mmHg LVOT Vmax:         62.70 cm/s LVOT Vmean:        39.200 cm/s LVOT VTI:          0.106 m LVOT/AV VTI ratio: 0.26  SHUNTS Systemic VTI:  0.11 m Systemic Diam: 2.60 cm Keller Paterson Electronically signed by Keller Paterson Signature Date/Time: 10/07/2023/2:00:53 PM    Final    DG Chest Port 1 View Result Date: 10/07/2023 CLINICAL DATA:  Weakness and fever.  Pleural effusion. EXAM: PORTABLE CHEST 1 VIEW COMPARISON:  CT chest, abdomen, and pelvis dated 10/06/2023. Chest radiograph dated 09/30/2023. FINDINGS: The heart size and mediastinal contours are unchanged. Aortic atherosclerosis. Large cavitary lesion is again noted in the right lung with surrounding airspace opacities.  Similar small bilateral pleural effusions with associated bibasilar atelectasis. Unchanged large hiatal hernia. No pneumothorax. No acute osseous abnormality. IMPRESSION: 1. Large cavitary lesion is again noted in the right lung with surrounding airspace opacities, concerning for cavitary pneumonia, better evaluated on the prior CT chest, abdomen, and pelvis dated 10/06/2023. 2. Similar small bilateral pleural effusions with associated bibasilar atelectasis. Electronically Signed   By: Harrietta Sherry M.D.   On: 10/07/2023 12:42   CT CHEST ABDOMEN PELVIS W CONTRAST Result Date: 10/06/2023 CLINICAL DATA:  Worsening leukocytosis and transaminitis despite antibiotic treatment. Unintentional weight loss. Known cavitary pneumonia. Clinical concern for possible abscess or malignancy. EXAM: CT CHEST, ABDOMEN, AND PELVIS WITH CONTRAST TECHNIQUE: Multidetector CT imaging of the chest, abdomen and pelvis was performed following the standard protocol during bolus administration of intravenous contrast. RADIATION DOSE REDUCTION: This exam was performed according to the departmental dose-optimization program which includes automated exposure control, adjustment of the mA and/or kV according to patient size and/or use of iterative reconstruction technique. CONTRAST:  80mL OMNIPAQUE  IOHEXOL  300 MG/ML  SOLN COMPARISON:  Chest CT dated 10/01/2023. Abdomen and pelvis CT dated 08/08/2023. FINDINGS: CT CHEST FINDINGS Cardiovascular: There is a moderate-sized pulmonary embolus in the distal left main  pulmonary artery and extending into the proximal left upper lobe pulmonary artery without occlusion. A smaller embolus is demonstrated in a right lower lobe pulmonary artery branch. These are not sufficient to cause right heart strain. The previously demonstrated 4.5 cm ascending thoracic aorta aneurysm currently measures 4.3 cm. Enlarged central pulmonary arteries with a main pulmonary artery measuring 3.6 cm in diameter. Enlarged  heart. Atheromatous calcifications, including the coronary arteries and aorta. Slight increase in size of a small pericardial effusion with a maximum thickness of 8 mm. Mediastinum/Nodes: No enlarged mediastinal, hilar, or axillary lymph nodes. Thyroid  gland, trachea, and esophagus demonstrate no significant findings. Again demonstrated is a large hiatal hernia with an intrathoracic stomach. Lungs/Pleura: Increased size and confluence of the large cavitary lesion previously demonstrated in the right upper lobe with a moderately thick, irregular peripheral rind and adjacent airspace opacity. This continues to contain some fluid, with mild improvement. This area currently measures 13.2 x 8.2 cm on image number 94/4, previously 8.6 x 6.5 cm in corresponding dimensions. Continued patchy airspace opacity in the adjacent portions of the right upper lobe. These areas have increased some in size and decreased some in density. The previously demonstrated right middle lobe nodular density has cavitated with 2 adjacent areas of cavitation and patchy opacity currently demonstrated in that area. Small to moderate-sized bilateral pleural effusions, increased on the right and without significant change on the left. Mild bilateral lower lobe atelectasis. Musculoskeletal: Mild thoracic spine degenerative changes. No evidence of bony metastatic disease. CT ABDOMEN PELVIS FINDINGS Hepatobiliary: No focal liver abnormality is seen. No gallstones, gallbladder wall thickening, or biliary dilatation. Pancreas: Unremarkable. No pancreatic ductal dilatation or surrounding inflammatory changes. Spleen: Normal in size without focal abnormality. Adrenals/Urinary Tract: Moderate dilatation of the left renal collecting system and ureter with mild progression mild dilatation of the right renal collecting system and ureter with mild improvement. Again demonstrated is asymmetrical anterior bladder wall thickening, thicker on the right, with  improvement. A suprapubic bladder catheter is currently demonstrated. Stable fat density left adrenal myelolipoma and probable lipid poor left adrenal adenoma. Unremarkable right adrenal gland. Stomach/Bowel: Extensive sigmoid and descending colon diverticulosis without evidence of diverticulitis. The appendix is not visualized. Again noted is the entire stomach within a large hiatal hernia. Unremarkable small bowel. Vascular/Lymphatic: Atheromatous arterial calcifications without aneurysm. No enlarged lymph nodes. Reproductive: Mildly enlarged and heterogeneous prostate gland with coarse calcifications. Other: No abdominal wall hernia or abnormality. No abdominopelvic ascites. Musculoskeletal: Moderate lumbar spine degenerative changes and mild scoliosis. IMPRESSION: 1. Bilateral pulmonary emboli, as described above. These are not sufficient to cause right heart strain. 2. Increased size and confluence of the large cavitary lesion previously demonstrated in the right upper lobe with a moderately thick, irregular peripheral rind and adjacent airspace opacity. This continues to contain some fluid, with mild improvement. This is most likely due to a cavitary pneumonia. A cavitary neoplasm is less likely since it was not present on the abdomen and pelvis CT obtained on 08/08/2023. 3. Continued patchy airspace opacity in the adjacent portions of the right upper lobe. These areas have increased some in size and decreased some in density, compatible with ongoing pneumonia. 4. The previously demonstrated right middle lobe nodular density has cavitated with 2 adjacent areas of cavitation and patchy opacity currently demonstrated in that area, also compatible with cavitary pneumonia. 5. Small to moderate-sized bilateral pleural effusions, increased on the right and without significant change on the left. 6. Mild bilateral lower lobe atelectasis. 7. Slight  increase in size of a small pericardial effusion. 8. 4.3 cm ascending  thoracic aorta aneurysm. Recommend annual imaging followup by CTA or MRA. This recommendation follows 2010 ACCF/AHA/AATS/ACR/ASA/SCA/SCAI/SIR/STS/SVM Guidelines for the Diagnosis and Management of Patients with Thoracic Aortic Disease. Circulation. 2010; 121: Z733-z630. Aortic aneurysm NOS (ICD10-I71.9) 9. Enlarged central pulmonary arteries, suggesting pulmonary arterial hypertension. 10. Cardiomegaly. 11. Large hiatal hernia with an intrathoracic stomach. 12. Moderate dilatation of the left renal collecting system and ureter with mild progression. Mild dilatation of the right renal collecting system and ureter with mild improvement. This remains compatible with bilateral ureteral obstruction at the level of the ureterovesical junction 13. Asymmetrical anterior bladder wall thickening, thicker on the right, with improvement. A suprapubic bladder catheter is currently demonstrated. 14. Stable left adrenal myelolipoma and probable lipid poor adenoma. 15. Extensive sigmoid and descending colon diverticulosis without evidence of diverticulitis. 16. Mildly enlarged and heterogeneous prostate gland. 17.  Calcific coronary artery and aortic atherosclerosis. Aortic Atherosclerosis (ICD10-I70.0). Critical Value/emergent results were called by telephone at the time of interpretation on 10/06/2023 at 2:14 pm to provider LANETA BLUNT , who verbally acknowledged these results. Electronically Signed   By: Elspeth Bathe M.D.   On: 10/06/2023 14:32   CT CHEST WO CONTRAST Result Date: 10/01/2023 CLINICAL DATA:  Weakness, fever, abnormal x-ray EXAM: CT CHEST WITHOUT CONTRAST TECHNIQUE: Multidetector CT imaging of the chest was performed following the standard protocol without IV contrast. RADIATION DOSE REDUCTION: This exam was performed according to the departmental dose-optimization program which includes automated exposure control, adjustment of the mA and/or kV according to patient size and/or use of iterative  reconstruction technique. COMPARISON:  03/04/2022, 08/08/2023, 09/30/2023 FINDINGS: Cardiovascular: Unenhanced imaging of the heart is unremarkable, without significant pericardial effusion. 4.5 cm ascending thoracic aortic aneurysm. Atherosclerosis of the aorta and coronary vasculature. Assessment of the vascular lumen cannot be performed without intravenous contrast. Mediastinum/Nodes: No enlarged mediastinal or axillary lymph nodes. Thyroid  gland, trachea, and esophagus demonstrate no significant findings. Moderate hiatal hernia. Lungs/Pleura: There is a thick walled right upper lobe cavity measuring 9.2 x 7.2 x 7.5 cm, with gas fluid level. There is surrounding dense airspace disease within the right upper lobe as well as within the right middle lobe. Findings are most consistent with cavitary pneumonia, new since the 08/08/2023 CT. There are small free-flowing bilateral pleural effusions, right greater than left. Dependent consolidation within the lower lobes most consistent with atelectasis. No pneumothorax. Central airways are patent. Upper Abdomen: Upper abdominal ascites. Musculoskeletal: No acute or destructive bony abnormalities. Reconstructed images demonstrate no additional findings. IMPRESSION: 1. Large thick walled right upper lobe cavity with gas fluid level, with dense airspace disease involving the right upper and right middle lobes, most consistent with cavitating pneumonia. This has developed since the abdominal CT 08/08/2023. 2. Small free-flowing bilateral pleural effusions. 3. Moderate hiatal hernia. 4. 4.5 cm ascending thoracic aortic aneurysm. Ascending thoracic aortic aneurysm. Recommend semi-annual imaging followup by CTA or MRA and referral to cardiothoracic surgery if not already obtained. This recommendation follows 2010 ACCF/AHA/AATS/ACR/ASA/SCA/SCAI/SIR/STS/SVM Guidelines for the Diagnosis and Management of Patients With Thoracic Aortic Disease. Circulation. 2010; 121: [id number].  Aortic aneurysm NOS (ICD10-I71.9) 5.  Aortic Atherosclerosis (ICD10-I70.0). 6. Upper abdominal ascites. Electronically Signed   By: Ozell Daring M.D.   On: 10/01/2023 20:12

## 2023-12-29 NOTE — Assessment & Plan Note (Signed)
 Chronic issue for him. Asymptomatic.  He is on Midodrine .

## 2023-12-31 ENCOUNTER — Ambulatory Visit

## 2023-12-31 ENCOUNTER — Ambulatory Visit
Admission: RE | Admit: 2023-12-31 | Discharge: 2023-12-31 | Disposition: A | Discharge status: Home / Self Care | Source: Ambulatory Visit | Attending: Pulmonary Disease | Admitting: Pulmonary Disease

## 2023-12-31 DIAGNOSIS — J90 Pleural effusion, not elsewhere classified: Secondary | ICD-10-CM | POA: Diagnosis not present

## 2023-12-31 DIAGNOSIS — J984 Other disorders of lung: Secondary | ICD-10-CM | POA: Diagnosis not present

## 2023-12-31 DIAGNOSIS — R918 Other nonspecific abnormal finding of lung field: Secondary | ICD-10-CM | POA: Diagnosis not present

## 2023-12-31 DIAGNOSIS — J181 Lobar pneumonia, unspecified organism: Secondary | ICD-10-CM | POA: Insufficient documentation

## 2023-12-31 DIAGNOSIS — J9811 Atelectasis: Secondary | ICD-10-CM | POA: Diagnosis not present

## 2024-01-01 ENCOUNTER — Encounter: Payer: Self-pay | Admitting: Urology

## 2024-01-07 ENCOUNTER — Emergency Department
Admission: EM | Admit: 2024-01-07 | Discharge: 2024-01-08 | Disposition: A | Discharge status: Skilled Nursing Facility | Attending: Emergency Medicine | Admitting: Emergency Medicine

## 2024-01-07 ENCOUNTER — Encounter: Payer: Self-pay | Admitting: Emergency Medicine

## 2024-01-07 ENCOUNTER — Other Ambulatory Visit: Payer: Self-pay

## 2024-01-07 DIAGNOSIS — T83091A Other mechanical complication of indwelling urethral catheter, initial encounter: Secondary | ICD-10-CM | POA: Insufficient documentation

## 2024-01-07 DIAGNOSIS — E785 Hyperlipidemia, unspecified: Secondary | ICD-10-CM | POA: Diagnosis not present

## 2024-01-07 DIAGNOSIS — I10 Essential (primary) hypertension: Secondary | ICD-10-CM | POA: Diagnosis not present

## 2024-01-07 DIAGNOSIS — Y828 Other medical devices associated with adverse incidents: Secondary | ICD-10-CM | POA: Insufficient documentation

## 2024-01-07 DIAGNOSIS — R1084 Generalized abdominal pain: Secondary | ICD-10-CM | POA: Diagnosis not present

## 2024-01-07 DIAGNOSIS — Z4682 Encounter for fitting and adjustment of non-vascular catheter: Secondary | ICD-10-CM | POA: Diagnosis not present

## 2024-01-07 DIAGNOSIS — T83010A Breakdown (mechanical) of cystostomy catheter, initial encounter: Secondary | ICD-10-CM

## 2024-01-07 DIAGNOSIS — D649 Anemia, unspecified: Secondary | ICD-10-CM | POA: Diagnosis not present

## 2024-01-07 MED ORDER — HYDROCODONE-ACETAMINOPHEN 5-325 MG PO TABS
1.0000 | ORAL_TABLET | Freq: Once | ORAL | Status: AC
  Administered 2024-01-07: 1 via ORAL
  Filled 2024-01-07: qty 1

## 2024-01-07 NOTE — ED Notes (Signed)
 This NT assisted nurse Jeanna change pt. Pt had a BM. Pt has been cleaned up. Pt resting comfortably at this time.

## 2024-01-07 NOTE — ED Triage Notes (Signed)
 BIBA from [facility] due to suprapubic not being in the bladder and no BM since Monday. However he just stated he has BM in his pants now and urine is coming out his penis.   144/92 HR 88 99% RA Temp 97.5

## 2024-01-07 NOTE — ED Notes (Addendum)
 Cleaned the patient's BM and changed diaper

## 2024-01-08 DIAGNOSIS — Z743 Need for continuous supervision: Secondary | ICD-10-CM | POA: Diagnosis not present

## 2024-01-08 DIAGNOSIS — I959 Hypotension, unspecified: Secondary | ICD-10-CM | POA: Diagnosis not present

## 2024-01-08 NOTE — ED Provider Notes (Signed)
 [facility] Provider Note    Event Date/Time   First MD Initiated Contact with Patient 01/07/24 2018     (approximate)   History   Abdominal Pain   HPI  Zachary [name] is a 83 year old male presenting to the emergency department for evaluation of abdominal pain.  Reports that for the past day he has had some pain in the suprapubic area and noticed that his suprapubic catheter is not draining and instead he has had some leakage out of his penis.  Additionally reported that he had not had a bowel movement in a couple days, but had multiple bowel movements on presentation to the ER here.  Reports his catheter was changed few weeks ago.  No fevers or back pain.     Physical Exam   Triage Vital Signs: ED Triage Vitals  Encounter Vitals Group     BP 01/07/24 2008 (!) 132/116     Girls Systolic BP Percentile --      Girls Diastolic BP Percentile --      Boys Systolic BP Percentile --      Boys Diastolic BP Percentile --      Pulse Rate 01/07/24 2008 90     Resp 01/07/24 2008 20     Temp 01/07/24 2008 97.7 F (36.5 C)     Temp Source 01/07/24 2008 Oral     SpO2 01/07/24 2008 100 %     Weight 01/07/24 2009 175 lb 3.2 oz (79.5 kg)     Height 01/07/24 2009 6' 2 (1.88 m)     Head Circumference --      Peak Flow --      Pain Score 01/07/24 2009 8     Pain Loc --      Pain Education --      Exclude from Growth Chart --     Most recent vital signs: Vitals:   01/07/24 2008 01/07/24 2330  BP: (!) 132/116 (!) 125/95  Pulse: 90   Resp: 20   Temp: 97.7 F (36.5 C)   SpO2: 100%      General: Awake, interactive  CV:  Regular rate, good peripheral perfusion.  Resp:  Unlabored respirations.  Abd:  Fullness of the lower abdomen surrounding the patient suprapubic catheter.  Minimal drainage in bag. Neuro:  Symmetric facial movement, fluid speech   ED Results / Procedures / Treatments   Labs (all labs ordered are listed, but only abnormal  results are displayed) Labs Reviewed - No data to display   EKG EKG independently reviewed and interpreted by myself demonstrates:    RADIOLOGY Imaging independently reviewed and interpreted by myself demonstrates:   Formal Radiology Read:  No results found.  PROCEDURES:  Critical Care performed: No  SUPRAPUBIC TUBE PLACEMENT  Date/Time: 01/08/2024 12:28 AM  Performed by: Levander Slate, MD Authorized by: Levander Slate, MD   Consent:    Consent obtained:  Verbal   Consent given by:  Patient   Risks, benefits, and alternatives were discussed: yes   Sedation:    Sedation type:  None Anesthesia:    Anesthesia method:  None Procedure details:    Complexity:  Simple   Catheter type:  Foley   Catheter size:  16 Fr   Number of attempts:  1   Urine characteristics:  Blood-tinged Post-procedure details:    Procedure completion:  Tolerated    MEDICATIONS ORDERED IN ED: Medications  HYDROcodone -acetaminophen  (NORCO/VICODIN) 5-325 MG per tablet 1 tablet (1 tablet Oral  Given 01/07/24 2216)     IMPRESSION / MDM / ASSESSMENT AND PLAN / ED COURSE  I reviewed the triage vital signs and the nursing notes.  Differential diagnosis includes, but is not limited to, closed suprapubic catheter, no clinical history suggestive of UTI  Patient's presentation is most consistent with acute illness / injury with system symptoms.  83 year old male presenting to the emergency department with suprapubic catheter dysfunction.  Clinical history consistent with clogged suprapubic catheter.  Patient's old catheter was removed with rapid drainage of urine from his bladder.  A new suprapubic catheter was placed using a 16 French Foley catheter with return of blood-tinged, noncloudy fluid.  Patient reported complete resolution of his abdominal pain.  He is comfortable discharge home.  Instructed to follow-up with urology.  Strict return precautions provided.      FINAL CLINICAL IMPRESSION(S) / ED DIAGNOSES    Final diagnoses:  Suprapubic catheter dysfunction, initial encounter [facility])     Rx / DC Orders   ED Discharge Orders     None        Note:  This document was prepared using Dragon voice recognition software and may include unintentional dictation errors.   Levander Slate, MD 01/08/24 [phone]

## 2024-01-08 NOTE — Discharge Instructions (Signed)
 We replaced her suprapubic catheter with another 16 French catheter.  Let your urologist know that you were seen for catheter issue and has been replaced.  Follow-up with them as directed.  Return to the ER for new or worsening symptoms.

## 2024-01-09 DIAGNOSIS — S61204A Unspecified open wound of right ring finger without damage to nail, initial encounter: Secondary | ICD-10-CM | POA: Diagnosis not present

## 2024-01-09 DIAGNOSIS — I5022 Chronic systolic (congestive) heart failure: Secondary | ICD-10-CM | POA: Diagnosis not present

## 2024-01-09 DIAGNOSIS — D692 Other nonthrombocytopenic purpura: Secondary | ICD-10-CM | POA: Diagnosis not present

## 2024-01-09 DIAGNOSIS — N189 Chronic kidney disease, unspecified: Secondary | ICD-10-CM | POA: Diagnosis not present

## 2024-01-09 DIAGNOSIS — Z9359 Other cystostomy status: Secondary | ICD-10-CM | POA: Diagnosis not present

## 2024-01-09 DIAGNOSIS — D631 Anemia in chronic kidney disease: Secondary | ICD-10-CM | POA: Diagnosis not present

## 2024-01-12 ENCOUNTER — Ambulatory Visit: Payer: Self-pay | Admitting: Student in an Organized Health Care Education/Training Program

## 2024-01-19 ENCOUNTER — Ambulatory Visit: Admitting: Urology

## 2024-02-02 DEATH — deceased

## 2024-02-03 ENCOUNTER — Telehealth: Payer: Self-pay | Admitting: Oncology

## 2024-02-03 NOTE — Telephone Encounter (Signed)
 Called to adjust an appt for pt per authorization. Call was answered by spouse and she stated that pt has passed away on February 13, 2024.   Future appts canceled and noted

## 2024-02-09 ENCOUNTER — Ambulatory Visit: Admitting: Oncology

## 2024-02-09 ENCOUNTER — Ambulatory Visit

## 2024-02-09 ENCOUNTER — Other Ambulatory Visit

## 2024-02-09 ENCOUNTER — Ambulatory Visit: Admitting: Urology

## 2024-03-17 ENCOUNTER — Ambulatory Visit: Admitting: Pulmonary Disease
